# Patient Record
Sex: Male | Born: 1955 | Race: White | Hispanic: No | Smoking: Never smoker
Health system: Southern US, Community
[De-identification: ages and names within clinical notes are randomized; demographics above are authoritative.]

## PROBLEM LIST (undated history)

## (undated) DIAGNOSIS — N529 Male erectile dysfunction, unspecified: Secondary | ICD-10-CM

## (undated) DIAGNOSIS — F419 Anxiety disorder, unspecified: Secondary | ICD-10-CM

## (undated) DIAGNOSIS — G4700 Insomnia, unspecified: Secondary | ICD-10-CM

## (undated) DIAGNOSIS — T7840XA Allergy, unspecified, initial encounter: Secondary | ICD-10-CM

## (undated) DIAGNOSIS — I619 Nontraumatic intracerebral hemorrhage, unspecified: Secondary | ICD-10-CM

## (undated) DIAGNOSIS — F1910 Other psychoactive substance abuse, uncomplicated: Secondary | ICD-10-CM

## (undated) DIAGNOSIS — R4689 Other symptoms and signs involving appearance and behavior: Secondary | ICD-10-CM

## (undated) DIAGNOSIS — I10 Essential (primary) hypertension: Secondary | ICD-10-CM

## (undated) DIAGNOSIS — M7022 Olecranon bursitis, left elbow: Secondary | ICD-10-CM

## (undated) DIAGNOSIS — H269 Unspecified cataract: Secondary | ICD-10-CM

## (undated) DIAGNOSIS — L309 Dermatitis, unspecified: Secondary | ICD-10-CM

## (undated) DIAGNOSIS — F32A Depression, unspecified: Secondary | ICD-10-CM

## (undated) DIAGNOSIS — E785 Hyperlipidemia, unspecified: Secondary | ICD-10-CM

## (undated) DIAGNOSIS — I639 Cerebral infarction, unspecified: Secondary | ICD-10-CM

## (undated) DIAGNOSIS — R0789 Other chest pain: Secondary | ICD-10-CM

## (undated) DIAGNOSIS — F329 Major depressive disorder, single episode, unspecified: Secondary | ICD-10-CM

## (undated) DIAGNOSIS — Z87828 Personal history of other (healed) physical injury and trauma: Secondary | ICD-10-CM

## (undated) DIAGNOSIS — G459 Transient cerebral ischemic attack, unspecified: Secondary | ICD-10-CM

## (undated) DIAGNOSIS — R7303 Prediabetes: Secondary | ICD-10-CM

## (undated) HISTORY — DX: Essential (primary) hypertension: I10

## (undated) HISTORY — DX: Male erectile dysfunction, unspecified: N52.9

## (undated) HISTORY — DX: Insomnia, unspecified: G47.00

## (undated) HISTORY — PX: INCISE AND DRAIN ABCESS: PRO64

## (undated) HISTORY — DX: Allergy, unspecified, initial encounter: T78.40XA

## (undated) HISTORY — DX: Unspecified cataract: H26.9

## (undated) HISTORY — DX: Personal history of other (healed) physical injury and trauma: Z87.828

## (undated) HISTORY — DX: Dermatitis, unspecified: L30.9

## (undated) HISTORY — DX: Other chest pain: R07.89

## (undated) HISTORY — PX: SKIN TAG REMOVAL: SHX780

## (undated) HISTORY — DX: Other psychoactive substance abuse, uncomplicated: F19.10

## (undated) HISTORY — DX: Depression, unspecified: F32.A

## (undated) HISTORY — DX: Olecranon bursitis, left elbow: M70.22

## (undated) HISTORY — DX: Anxiety disorder, unspecified: F41.9

## (undated) HISTORY — PX: INNER EAR SURGERY: SHX679

## (undated) HISTORY — DX: Prediabetes: R73.03

## (undated) HISTORY — PX: KNEE ARTHROSCOPY: SUR90

## (undated) HISTORY — DX: Major depressive disorder, single episode, unspecified: F32.9

## (undated) HISTORY — DX: Hyperlipidemia, unspecified: E78.5

---

## 2000-04-09 ENCOUNTER — Emergency Department (HOSPITAL_COMMUNITY): Admission: EM | Admit: 2000-04-09 | Discharge: 2000-04-09 | Payer: Self-pay | Admitting: Emergency Medicine

## 2000-08-26 ENCOUNTER — Emergency Department (HOSPITAL_COMMUNITY): Admission: EM | Admit: 2000-08-26 | Discharge: 2000-08-26 | Payer: Self-pay | Admitting: Emergency Medicine

## 2000-08-26 ENCOUNTER — Encounter: Payer: Self-pay | Admitting: Emergency Medicine

## 2000-10-17 ENCOUNTER — Encounter: Admission: RE | Admit: 2000-10-17 | Discharge: 2000-10-17 | Payer: Self-pay | Admitting: *Deleted

## 2000-10-17 ENCOUNTER — Encounter: Payer: Self-pay | Admitting: *Deleted

## 2000-10-22 ENCOUNTER — Other Ambulatory Visit (HOSPITAL_COMMUNITY): Admission: RE | Admit: 2000-10-22 | Discharge: 2000-11-01 | Payer: Self-pay | Admitting: Psychiatry

## 2003-03-05 ENCOUNTER — Encounter: Payer: Self-pay | Admitting: Emergency Medicine

## 2003-03-05 ENCOUNTER — Emergency Department (HOSPITAL_COMMUNITY): Admission: EM | Admit: 2003-03-05 | Discharge: 2003-03-05 | Payer: Self-pay | Admitting: Emergency Medicine

## 2003-03-09 ENCOUNTER — Encounter: Admission: RE | Admit: 2003-03-09 | Discharge: 2003-03-09 | Payer: Self-pay | Admitting: Internal Medicine

## 2003-03-23 ENCOUNTER — Encounter: Admission: RE | Admit: 2003-03-23 | Discharge: 2003-03-23 | Payer: Self-pay | Admitting: Internal Medicine

## 2003-04-16 ENCOUNTER — Encounter: Admission: RE | Admit: 2003-04-16 | Discharge: 2003-04-16 | Payer: Self-pay | Admitting: Internal Medicine

## 2003-05-20 ENCOUNTER — Encounter: Admission: RE | Admit: 2003-05-20 | Discharge: 2003-05-20 | Payer: Self-pay | Admitting: Internal Medicine

## 2003-08-24 ENCOUNTER — Encounter: Admission: RE | Admit: 2003-08-24 | Discharge: 2003-08-24 | Payer: Self-pay | Admitting: Internal Medicine

## 2003-10-21 ENCOUNTER — Encounter: Admission: RE | Admit: 2003-10-21 | Discharge: 2003-10-21 | Payer: Self-pay | Admitting: Internal Medicine

## 2003-11-10 ENCOUNTER — Encounter: Admission: RE | Admit: 2003-11-10 | Discharge: 2003-11-10 | Payer: Self-pay | Admitting: Internal Medicine

## 2003-12-29 ENCOUNTER — Encounter: Admission: RE | Admit: 2003-12-29 | Discharge: 2003-12-29 | Payer: Self-pay | Admitting: Internal Medicine

## 2003-12-29 ENCOUNTER — Inpatient Hospital Stay (HOSPITAL_COMMUNITY): Admission: AD | Admit: 2003-12-29 | Discharge: 2003-12-31 | Payer: Self-pay | Admitting: Internal Medicine

## 2004-02-05 ENCOUNTER — Ambulatory Visit: Payer: Self-pay | Admitting: Internal Medicine

## 2004-03-31 ENCOUNTER — Ambulatory Visit: Payer: Self-pay | Admitting: Internal Medicine

## 2004-04-07 ENCOUNTER — Ambulatory Visit: Payer: Self-pay | Admitting: Internal Medicine

## 2004-07-21 ENCOUNTER — Emergency Department (HOSPITAL_COMMUNITY): Admission: EM | Admit: 2004-07-21 | Discharge: 2004-07-21 | Payer: Self-pay | Admitting: Emergency Medicine

## 2004-08-23 ENCOUNTER — Ambulatory Visit: Payer: Self-pay | Admitting: Internal Medicine

## 2005-01-23 ENCOUNTER — Ambulatory Visit: Payer: Self-pay | Admitting: Internal Medicine

## 2005-02-23 ENCOUNTER — Ambulatory Visit: Payer: Self-pay | Admitting: Internal Medicine

## 2005-05-05 ENCOUNTER — Ambulatory Visit: Payer: Self-pay | Admitting: Internal Medicine

## 2006-02-01 ENCOUNTER — Ambulatory Visit: Payer: Self-pay | Admitting: Hospitalist

## 2006-02-01 ENCOUNTER — Ambulatory Visit (HOSPITAL_COMMUNITY): Admission: RE | Admit: 2006-02-01 | Discharge: 2006-02-01 | Payer: Self-pay | Admitting: Hospitalist

## 2006-02-08 ENCOUNTER — Ambulatory Visit: Payer: Self-pay | Admitting: Internal Medicine

## 2006-03-09 ENCOUNTER — Ambulatory Visit: Payer: Self-pay | Admitting: Internal Medicine

## 2006-03-09 ENCOUNTER — Encounter (INDEPENDENT_AMBULATORY_CARE_PROVIDER_SITE_OTHER): Payer: Self-pay | Admitting: Internal Medicine

## 2006-03-09 LAB — CONVERTED CEMR LAB
Albumin: 4.3 g/dL (ref 3.5–5.2)
BUN: 13 mg/dL (ref 6–23)
CO2: 26 meq/L (ref 19–32)
Chloride: 101 meq/L (ref 96–112)
Sodium: 140 meq/L (ref 135–145)

## 2006-03-30 ENCOUNTER — Ambulatory Visit (HOSPITAL_COMMUNITY): Admission: RE | Admit: 2006-03-30 | Discharge: 2006-03-30 | Payer: Self-pay | Admitting: Internal Medicine

## 2006-04-04 ENCOUNTER — Encounter (INDEPENDENT_AMBULATORY_CARE_PROVIDER_SITE_OTHER): Payer: Self-pay | Admitting: Internal Medicine

## 2006-04-04 ENCOUNTER — Ambulatory Visit: Payer: Self-pay

## 2006-04-23 DIAGNOSIS — I10 Essential (primary) hypertension: Secondary | ICD-10-CM | POA: Insufficient documentation

## 2006-04-23 DIAGNOSIS — K219 Gastro-esophageal reflux disease without esophagitis: Secondary | ICD-10-CM | POA: Insufficient documentation

## 2006-04-23 DIAGNOSIS — E785 Hyperlipidemia, unspecified: Secondary | ICD-10-CM

## 2006-04-23 DIAGNOSIS — F5104 Psychophysiologic insomnia: Secondary | ICD-10-CM | POA: Insufficient documentation

## 2006-04-23 DIAGNOSIS — R079 Chest pain, unspecified: Secondary | ICD-10-CM

## 2006-04-23 DIAGNOSIS — S058X9A Other injuries of unspecified eye and orbit, initial encounter: Secondary | ICD-10-CM

## 2006-06-06 ENCOUNTER — Ambulatory Visit: Payer: Self-pay | Admitting: Internal Medicine

## 2006-06-07 ENCOUNTER — Encounter (INDEPENDENT_AMBULATORY_CARE_PROVIDER_SITE_OTHER): Payer: Self-pay | Admitting: Internal Medicine

## 2006-06-07 DIAGNOSIS — M23302 Other meniscus derangements, unspecified lateral meniscus, unspecified knee: Secondary | ICD-10-CM | POA: Insufficient documentation

## 2006-06-07 DIAGNOSIS — F329 Major depressive disorder, single episode, unspecified: Secondary | ICD-10-CM

## 2006-06-07 DIAGNOSIS — L909 Atrophic disorder of skin, unspecified: Secondary | ICD-10-CM | POA: Insufficient documentation

## 2006-06-07 DIAGNOSIS — L919 Hypertrophic disorder of the skin, unspecified: Secondary | ICD-10-CM

## 2006-06-07 DIAGNOSIS — F528 Other sexual dysfunction not due to a substance or known physiological condition: Secondary | ICD-10-CM

## 2006-06-07 LAB — CONVERTED CEMR LAB
Amphetamine Screen, Ur: NEGATIVE
BUN: 14 mg/dL (ref 6–23)
Barbiturate Quant, Ur: NEGATIVE
Benzodiazepines.: NEGATIVE
Calcium: 9.1 mg/dL (ref 8.4–10.5)
Chloride: 103 meq/L (ref 96–112)
Cocaine Metabolites: NEGATIVE
Creatinine, Ser: 0.95 mg/dL (ref 0.40–1.50)
Creatinine,U: 114.2 mg/dL
Marijuana Metabolite: NEGATIVE
Opiates: NEGATIVE
Phencyclidine (PCP): NEGATIVE
Potassium: 4.3 meq/L (ref 3.5–5.3)

## 2006-06-18 ENCOUNTER — Telehealth: Payer: Self-pay | Admitting: *Deleted

## 2006-07-18 ENCOUNTER — Telehealth: Payer: Self-pay | Admitting: *Deleted

## 2006-08-23 ENCOUNTER — Telehealth: Payer: Self-pay | Admitting: *Deleted

## 2006-08-23 ENCOUNTER — Observation Stay (HOSPITAL_COMMUNITY): Admission: EM | Admit: 2006-08-23 | Discharge: 2006-08-24 | Payer: Self-pay | Admitting: Emergency Medicine

## 2006-08-23 ENCOUNTER — Ambulatory Visit: Payer: Self-pay | Admitting: Infectious Diseases

## 2006-08-24 ENCOUNTER — Ambulatory Visit: Payer: Self-pay | Admitting: Cardiology

## 2006-09-05 ENCOUNTER — Telehealth: Payer: Self-pay | Admitting: *Deleted

## 2006-09-06 ENCOUNTER — Encounter: Payer: Self-pay | Admitting: Internal Medicine

## 2006-09-06 ENCOUNTER — Ambulatory Visit: Payer: Self-pay | Admitting: Internal Medicine

## 2006-09-06 DIAGNOSIS — H9190 Unspecified hearing loss, unspecified ear: Secondary | ICD-10-CM

## 2006-09-06 DIAGNOSIS — B351 Tinea unguium: Secondary | ICD-10-CM

## 2006-09-06 LAB — CONVERTED CEMR LAB
BUN: 12 mg/dL (ref 6–23)
CO2: 28 meq/L (ref 19–32)
Glucose, Bld: 92 mg/dL (ref 70–99)

## 2006-10-17 ENCOUNTER — Ambulatory Visit: Payer: Self-pay | Admitting: Hospitalist

## 2006-10-17 DIAGNOSIS — L259 Unspecified contact dermatitis, unspecified cause: Secondary | ICD-10-CM

## 2006-11-08 ENCOUNTER — Telehealth: Payer: Self-pay | Admitting: *Deleted

## 2007-01-31 ENCOUNTER — Telehealth: Payer: Self-pay | Admitting: *Deleted

## 2007-03-01 ENCOUNTER — Ambulatory Visit: Payer: Self-pay | Admitting: *Deleted

## 2007-03-01 ENCOUNTER — Encounter (INDEPENDENT_AMBULATORY_CARE_PROVIDER_SITE_OTHER): Payer: Self-pay | Admitting: Internal Medicine

## 2007-03-01 LAB — CONVERTED CEMR LAB
AST: 15 units/L (ref 0–37)
Alkaline Phosphatase: 73 units/L (ref 39–117)
CO2: 26 meq/L (ref 19–32)
Calcium: 9.4 mg/dL (ref 8.4–10.5)
Creatinine, Ser: 1.09 mg/dL (ref 0.40–1.50)
Glucose, Bld: 98 mg/dL (ref 70–99)
HCT: 45.4 % (ref 39.0–52.0)
Hemoglobin: 15.2 g/dL (ref 13.0–17.0)
Lymphs Abs: 1.7 10*3/uL (ref 0.7–3.3)
MCV: 86.1 fL (ref 78.0–100.0)
Monocytes Absolute: 0.7 10*3/uL (ref 0.2–0.7)
Neutro Abs: 6 10*3/uL (ref 1.7–7.7)
Potassium: 3.8 meq/L (ref 3.5–5.3)
RBC: 5.27 M/uL (ref 4.22–5.81)
RDW: 13.7 % (ref 11.5–14.0)
TSH: 0.589 microintl units/mL (ref 0.350–5.50)
Total Bilirubin: 0.5 mg/dL (ref 0.3–1.2)
WBC: 9.1 10*3/uL (ref 4.0–10.5)

## 2007-04-07 ENCOUNTER — Emergency Department (HOSPITAL_COMMUNITY): Admission: EM | Admit: 2007-04-07 | Discharge: 2007-04-07 | Payer: Self-pay | Admitting: Emergency Medicine

## 2007-04-29 ENCOUNTER — Telehealth: Payer: Self-pay | Admitting: *Deleted

## 2007-10-28 ENCOUNTER — Ambulatory Visit: Payer: Self-pay | Admitting: *Deleted

## 2007-11-08 ENCOUNTER — Ambulatory Visit: Payer: Self-pay | Admitting: Internal Medicine

## 2007-11-08 DIAGNOSIS — S83509A Sprain of unspecified cruciate ligament of unspecified knee, initial encounter: Secondary | ICD-10-CM

## 2007-12-20 ENCOUNTER — Encounter (INDEPENDENT_AMBULATORY_CARE_PROVIDER_SITE_OTHER): Payer: Self-pay | Admitting: Internal Medicine

## 2007-12-20 ENCOUNTER — Ambulatory Visit: Payer: Self-pay | Admitting: Internal Medicine

## 2007-12-20 LAB — CONVERTED CEMR LAB: PSA: 1.36 ng/mL (ref 0.10–4.00)

## 2007-12-21 LAB — FECAL OCCULT BLOOD, GUAIAC: Fecal Occult Blood: NEGATIVE

## 2007-12-26 ENCOUNTER — Ambulatory Visit: Payer: Self-pay | Admitting: Internal Medicine

## 2007-12-26 LAB — CONVERTED CEMR LAB
OCCULT 2: NEGATIVE
OCCULT 3: NEGATIVE

## 2008-01-13 ENCOUNTER — Encounter (INDEPENDENT_AMBULATORY_CARE_PROVIDER_SITE_OTHER): Payer: Self-pay | Admitting: Internal Medicine

## 2008-01-23 ENCOUNTER — Ambulatory Visit: Payer: Self-pay | Admitting: Internal Medicine

## 2008-02-04 ENCOUNTER — Encounter (INDEPENDENT_AMBULATORY_CARE_PROVIDER_SITE_OTHER): Payer: Self-pay | Admitting: Internal Medicine

## 2008-02-06 ENCOUNTER — Emergency Department (HOSPITAL_COMMUNITY): Admission: EM | Admit: 2008-02-06 | Discharge: 2008-02-06 | Payer: Self-pay | Admitting: Emergency Medicine

## 2008-02-06 ENCOUNTER — Ambulatory Visit: Payer: Self-pay | Admitting: Internal Medicine

## 2008-02-06 LAB — CONVERTED CEMR LAB
Glucose, Urine, Semiquant: 100
Specific Gravity, Urine: 1.025

## 2008-06-25 ENCOUNTER — Encounter (INDEPENDENT_AMBULATORY_CARE_PROVIDER_SITE_OTHER): Payer: Self-pay | Admitting: Internal Medicine

## 2008-06-25 ENCOUNTER — Ambulatory Visit: Payer: Self-pay | Admitting: Internal Medicine

## 2008-06-25 ENCOUNTER — Ambulatory Visit (HOSPITAL_COMMUNITY): Admission: RE | Admit: 2008-06-25 | Discharge: 2008-06-25 | Payer: Self-pay | Admitting: Internal Medicine

## 2008-07-10 ENCOUNTER — Emergency Department (HOSPITAL_COMMUNITY): Admission: EM | Admit: 2008-07-10 | Discharge: 2008-07-10 | Payer: Self-pay | Admitting: Emergency Medicine

## 2008-09-11 ENCOUNTER — Telehealth (INDEPENDENT_AMBULATORY_CARE_PROVIDER_SITE_OTHER): Payer: Self-pay | Admitting: Internal Medicine

## 2008-09-18 ENCOUNTER — Telehealth (INDEPENDENT_AMBULATORY_CARE_PROVIDER_SITE_OTHER): Payer: Self-pay | Admitting: Internal Medicine

## 2009-01-11 ENCOUNTER — Ambulatory Visit (HOSPITAL_COMMUNITY): Admission: RE | Admit: 2009-01-11 | Discharge: 2009-01-11 | Payer: Self-pay | Admitting: Internal Medicine

## 2009-01-11 ENCOUNTER — Encounter: Payer: Self-pay | Admitting: Internal Medicine

## 2009-01-11 ENCOUNTER — Ambulatory Visit: Payer: Self-pay | Admitting: Internal Medicine

## 2009-01-11 DIAGNOSIS — L84 Corns and callosities: Secondary | ICD-10-CM

## 2009-01-18 ENCOUNTER — Encounter: Payer: Self-pay | Admitting: Internal Medicine

## 2009-01-21 ENCOUNTER — Ambulatory Visit: Payer: Self-pay | Admitting: Internal Medicine

## 2009-01-29 ENCOUNTER — Telehealth: Payer: Self-pay | Admitting: Internal Medicine

## 2009-02-09 ENCOUNTER — Telehealth: Payer: Self-pay | Admitting: *Deleted

## 2009-02-10 ENCOUNTER — Telehealth: Payer: Self-pay | Admitting: Internal Medicine

## 2009-02-10 ENCOUNTER — Telehealth: Payer: Self-pay | Admitting: *Deleted

## 2009-02-15 ENCOUNTER — Ambulatory Visit: Payer: Self-pay | Admitting: Family Medicine

## 2009-02-15 ENCOUNTER — Telehealth: Payer: Self-pay | Admitting: Internal Medicine

## 2009-03-29 ENCOUNTER — Telehealth: Payer: Self-pay | Admitting: *Deleted

## 2009-04-01 ENCOUNTER — Ambulatory Visit: Payer: Self-pay | Admitting: Internal Medicine

## 2009-04-01 DIAGNOSIS — L408 Other psoriasis: Secondary | ICD-10-CM | POA: Insufficient documentation

## 2009-04-01 DIAGNOSIS — L570 Actinic keratosis: Secondary | ICD-10-CM | POA: Insufficient documentation

## 2009-04-01 LAB — CONVERTED CEMR LAB

## 2009-04-20 ENCOUNTER — Telehealth: Payer: Self-pay | Admitting: Internal Medicine

## 2009-07-06 ENCOUNTER — Telehealth: Payer: Self-pay | Admitting: Internal Medicine

## 2009-08-20 ENCOUNTER — Ambulatory Visit: Payer: Self-pay | Admitting: Internal Medicine

## 2009-10-27 DIAGNOSIS — M7022 Olecranon bursitis, left elbow: Secondary | ICD-10-CM

## 2009-10-27 HISTORY — DX: Olecranon bursitis, left elbow: M70.22

## 2009-11-06 ENCOUNTER — Emergency Department (HOSPITAL_COMMUNITY): Admission: EM | Admit: 2009-11-06 | Discharge: 2009-11-06 | Payer: Self-pay | Admitting: Emergency Medicine

## 2009-11-12 ENCOUNTER — Telehealth: Payer: Self-pay | Admitting: Internal Medicine

## 2009-11-12 ENCOUNTER — Emergency Department (HOSPITAL_COMMUNITY): Admission: EM | Admit: 2009-11-12 | Discharge: 2009-11-12 | Payer: Self-pay | Admitting: Emergency Medicine

## 2009-11-16 ENCOUNTER — Ambulatory Visit: Payer: Self-pay | Admitting: Internal Medicine

## 2009-11-16 DIAGNOSIS — M25529 Pain in unspecified elbow: Secondary | ICD-10-CM

## 2009-11-19 ENCOUNTER — Ambulatory Visit: Payer: Self-pay | Admitting: Internal Medicine

## 2009-11-19 ENCOUNTER — Ambulatory Visit: Payer: Self-pay | Admitting: Family Medicine

## 2009-11-19 ENCOUNTER — Inpatient Hospital Stay (HOSPITAL_COMMUNITY): Admission: AD | Admit: 2009-11-19 | Discharge: 2009-11-23 | Payer: Self-pay | Admitting: Family Medicine

## 2009-11-19 ENCOUNTER — Encounter: Payer: Self-pay | Admitting: Internal Medicine

## 2009-11-19 DIAGNOSIS — R05 Cough: Secondary | ICD-10-CM

## 2009-11-23 ENCOUNTER — Encounter: Payer: Self-pay | Admitting: Internal Medicine

## 2009-11-23 DIAGNOSIS — IMO0002 Reserved for concepts with insufficient information to code with codable children: Secondary | ICD-10-CM

## 2009-11-25 ENCOUNTER — Telehealth (INDEPENDENT_AMBULATORY_CARE_PROVIDER_SITE_OTHER): Payer: Self-pay | Admitting: *Deleted

## 2009-12-01 ENCOUNTER — Ambulatory Visit: Payer: Self-pay | Admitting: Internal Medicine

## 2009-12-10 ENCOUNTER — Ambulatory Visit: Payer: Self-pay | Admitting: Family Medicine

## 2009-12-31 ENCOUNTER — Ambulatory Visit: Payer: Self-pay | Admitting: Family Medicine

## 2010-01-04 ENCOUNTER — Ambulatory Visit (HOSPITAL_COMMUNITY): Admission: RE | Admit: 2010-01-04 | Discharge: 2010-01-04 | Payer: Self-pay | Admitting: Family Medicine

## 2010-01-12 ENCOUNTER — Telehealth (INDEPENDENT_AMBULATORY_CARE_PROVIDER_SITE_OTHER): Payer: Self-pay | Admitting: *Deleted

## 2010-01-27 ENCOUNTER — Telehealth: Payer: Self-pay | Admitting: Internal Medicine

## 2010-02-14 ENCOUNTER — Ambulatory Visit: Payer: Self-pay | Admitting: Family Medicine

## 2010-03-10 ENCOUNTER — Telehealth: Payer: Self-pay | Admitting: Internal Medicine

## 2010-03-14 ENCOUNTER — Encounter: Admission: RE | Admit: 2010-03-14 | Discharge: 2010-04-07 | Payer: Self-pay | Admitting: Family Medicine

## 2010-03-14 ENCOUNTER — Encounter (INDEPENDENT_AMBULATORY_CARE_PROVIDER_SITE_OTHER): Payer: Self-pay | Admitting: *Deleted

## 2010-03-18 ENCOUNTER — Telehealth: Payer: Self-pay | Admitting: Family Medicine

## 2010-04-05 ENCOUNTER — Telehealth: Payer: Self-pay | Admitting: Internal Medicine

## 2010-04-07 ENCOUNTER — Encounter: Payer: Self-pay | Admitting: Family Medicine

## 2010-06-18 ENCOUNTER — Encounter: Payer: Self-pay | Admitting: Internal Medicine

## 2010-06-20 ENCOUNTER — Telehealth: Payer: Self-pay | Admitting: Internal Medicine

## 2010-06-23 ENCOUNTER — Ambulatory Visit: Admission: RE | Admit: 2010-06-23 | Discharge: 2010-06-23 | Payer: Self-pay | Source: Home / Self Care

## 2010-06-23 DIAGNOSIS — H9319 Tinnitus, unspecified ear: Secondary | ICD-10-CM | POA: Insufficient documentation

## 2010-06-26 LAB — CONVERTED CEMR LAB
ALT: 22 units/L (ref 0–53)
AST: 17 units/L (ref 0–37)
Alkaline Phosphatase: 72 units/L (ref 39–117)
BUN: 15 mg/dL (ref 6–23)
Calcium: 9.6 mg/dL (ref 8.4–10.5)
Chloride: 102 meq/L (ref 96–112)
HDL: 36 mg/dL — ABNORMAL LOW (ref >39)
Total Bilirubin: 0.6 mg/dL (ref 0.3–1.2)
Total CHOL/HDL Ratio: 6.8
Total Protein: 7.5 g/dL (ref 6.0–8.3)
VLDL: 41 mg/dL — ABNORMAL HIGH (ref 0–40)

## 2010-06-28 NOTE — Progress Notes (Signed)
Summary: refill/ hla  Phone Note Refill Request Message from:  Fax from Pharmacy on January 27, 2010 4:04 PM  Refills Requested: Medication #1:  vicodin 5/500 1 tablet twice daily   Dosage confirmed as above?Dosage Confirmed   Last Refilled: 3/30 last visit 6/21, has had percocet 7/15 by sara neal, was in hosp 6/24  Initial call taken by: Marin Roberts RN,  January 27, 2010 4:08 PM  Follow-up for Phone Call        Need was documented in D/C summary 6/24 for chronic r knee pain. ot Perc 5 mg #30 7/18 Dr Jennette Kettle. I had trouble finding last hydrocone Rx but looks like 1/10. Will refill using the last strength I could find - 5/500. Follow-up by: Blanch Media MD,  January 28, 2010 7:56 AM    New/Updated Medications: HYDROCODONE-ACETAMINOPHEN 5-500 MG TABS (HYDROCODONE-ACETAMINOPHEN) Take one pill by mouth every 4 hours as needed for severe R knee pain. Prescriptions: HYDROCODONE-ACETAMINOPHEN 5-500 MG TABS (HYDROCODONE-ACETAMINOPHEN) Take one pill by mouth every 4 hours as needed for severe R knee pain.  #30 x 0   Entered and Authorized by:   Blanch Media MD   Signed by:   Blanch Media MD on 01/28/2010   Method used:   Telephoned to ...       [facility] (retail)       [address]       [place], [place]  [address]       Ph: [phone]       Fax: [phone]   RxID:   [id number]

## 2010-06-28 NOTE — Assessment & Plan Note (Signed)
Summary: 9:00 APPT,F/U L ELBOW INFECTION   Vital Signs:  Patient profile:   55 year old male BP sitting:   138 / 85  Vitals Entered By: Lillia Pauls CMA (December 31, 2009 9:08 AM)  Primary Care Tayler Heiden:  Mariea Stable MD   History of Present Illness: 55 yo M here to f/u L olecranon bursitis and elbow infection.  Last month had I&D and completed 2 week course of Abx. Has not gotten his full ROM in elbow back. Still having some deep seeded pain in elbow joint.  Using opposite arm to help extend. Still having some night time pain that wakes him up. Unable to put direct pressure on his elbow and still feels swollen. Able to use elbow ok, but still with decreased strength. No F/S/C. Felt slightly swollen and warm a few days ago, tylenol helped. Has only used a few percocet. Has not done any PT.  Current Medications (verified): 1)  Aspirin 81 Mg Chew (Aspirin) .... Take 1 Tablet By Mouth Once A Day 2)  Lisinopril-Hydrochlorothiazide 20-12.5 Mg  Tabs (Lisinopril-Hydrochlorothiazide) .... Take 2 Tablets By Mouth Once A Day 3)  Carvedilol 12.5 Mg Tabs (Carvedilol) .... Take 1 Tablet By Mouth Two Times A Day 4)  Pravachol 40 Mg Tabs (Pravastatin Sodium) .... Take 1 Tab By Mouth At Bedtime 5)  Betamethasone Dipropionate 0.05 % Oint (Betamethasone Dipropionate) .... Apply To Affected Area Twice Daily. 6)  Percocet 5-325 Mg Tabs (Oxycodone-Acetaminophen) .Marland Kitchen.. 1-2 By Mouth At Bedtime As Needed Pain 7)  Valium 5 Mg Tabs (Diazepam) .... Take One Tab One Hour Before The Procedure and Addition Tab Prn  Allergies: No Known Drug Allergies  Review of Systems  The patient denies fever.         Please see HPI for additional ROS.   Physical Exam  General:  alert, well-nourished, and overweight-appearing.     Shoulder/Elbow Exam  Skin:    No redness/warmth  Inspection:    Mild swelling about olecranon bursa  Elbow Exam:    Left:    Inspection:  Normal    Nl ROM    .  Pt with  ROM of L elbow 0-150 deg. + pain with resisted elbow extension particularly in ulnar nerve canal. tricep nontender   Impression & Recommendations:  Problem # 1:  ELBOW PAIN, LEFT (ICD-719.42) Assessment Improved  continued issues- I am concerned there is residual infection or inflammation---will repeat MRI. If it is negative, we weill start PT. rtc 4 w will call him w MRI results (and to set up PT if appropriate)  Orders: MRI without Contrast (MRI w/o Contrast)  Problem # 2:  CELLULITIS AND ABSCESS OF UPPER ARM AND FOREARM (ICD-682.3) Assessment: Improved  Orders: MRI without Contrast (MRI w/o Contrast)  Complete Medication List: 1)  Aspirin 81 Mg Chew (Aspirin) .... Take 1 tablet by mouth once a day 2)  Lisinopril-hydrochlorothiazide 20-12.5 Mg Tabs (Lisinopril-hydrochlorothiazide) .... Take 2 tablets by mouth once a day 3)  Carvedilol 12.5 Mg Tabs (Carvedilol) .... Take 1 tablet by mouth two times a day 4)  Pravachol 40 Mg Tabs (Pravastatin sodium) .... Take 1 tab by mouth at bedtime 5)  Betamethasone Dipropionate 0.05 % Oint (Betamethasone dipropionate) .... Apply to affected area twice daily. 6)  Percocet 5-325 Mg Tabs (Oxycodone-acetaminophen) .Marland Kitchen.. 1-2 by mouth at bedtime as needed pain 7)  Valium 5 Mg Tabs (Diazepam) .... Take one tab one hour before the procedure and addition tab prn  Patient Instructions: 1)  MRI IS ON TUE AUG 9TH AT 2PM AT [facility]. ARRIVE AT 1PM IN ADMITTING Prescriptions: VALIUM 5 MG TABS (DIAZEPAM) take one tab one hour before the procedure and addition tab prn  #2 x 0   Entered by:   Lillia Pauls CMA   Authorized by:   Denny Levy MD   Signed by:   Lillia Pauls CMA on 12/31/2009   Method used:   Print then Give to Patient   RxID:   [id number]

## 2010-06-28 NOTE — Miscellaneous (Signed)
Summary: need order for elbow PT  Clinical Lists Changes pt wants to go ahead with elbow pt. Becky, the pt at [employer] [address] wants the order faxed to [phone]. call her for questions at [phone].Marland KitchenGolden Circle RN  March 14, 2010 5:39 PM [place] didn't we do this yesterday? If not--can we do it today? [place] Dec yes.... pt to have iontophoreisis as new PT order. Lillia Pauls [facility]  March 15, 2010 5:32 PM

## 2010-06-28 NOTE — Letter (Signed)
Summary: [facility] PT Referral form  [facility] PT Referral form   Imported By: Marily Memos 02/15/2010 08:43:47  _____________________________________________________________________  External Attachment:    Type:   Image     Comment:   External Document

## 2010-06-28 NOTE — Miscellaneous (Signed)
Summary: hospital admission (left elbow pain)  INTERNAL MEDICINE ADMISSION HISTORY AND PHYSICAL  PCP: Dr. Onalee Hua  CC: swollen elbow joint  HPI: 55 y/o man with PMH [facility] a [facility] of septic joint, He first noticed swelling, pain and redness in his left elbow 1 week ago. He went to the [facility] ED  where he was asked to take NSAIDs for possible bursisits. . His swelling and pain did not get better. He came to [facility] 6/17 where he was given  keflex and doxy His symptoms got better after this but did not resove completely. He came to clinc on Tuesday 6/21 when he was referred to [facility] . He went to the [facility] on the day of admission where a suspicion of septic joint was rasied and patient admitted to hospital. His associated symtoms include subjective fever and fatigue  since the onset of symtoms. He also has a cough productive of whitish sputum since last 3-4 days and he has been having occasional shortness of breath. He has not had the complaints of joint swelling or pain in the past.he denies sick contacts, travel, injury to the affected joint, other joint involvement, rash,repetetive movemnt of the joint or other complaints. No h/o Gout, morning stiffenss  ALLERGIES: NKDA  PAST MEDICAL HISTORY: GERD Hyperlipidemia Hypertension Depression ACL tear and meniscal tear causing chroinic right knee pain.  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 Separated, but ex-wife is his "best friend" 2 daughters, 1 grandson Special educational needs teacher in past, unemplyed now.  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  Never Smoked Alcohol use-occasional on social occasion Drugs- denies Self pay- in process for apllying for orange card   FAMILY HISTORY Father- died of CAD at 26. Has emhpysema as well Mother- Had stroke at age of 85 Sister has severe DM. Iss 48 y/o   VITALS:  O2 Sat:      97 % Pulse rate:   78 / minute Resp:     26 per minute BP sitting:   130 / 84 T- 98.8  PHYSICAL EXAM: General:  Well-developed,well-nourished,in no acute distress;  Appears uncomfortable 2/2 left elbow pain. Neck:  No LAD. Lungs:  Good aeration overall.Some non-productive coughing on deep inspiration.Faint RLL crackles during coughing episode. Heart:  RRR. Normal S1/S2. No m/r/g. 2+ bilateral rad/dp pulses. No edema. Abdomen:  (+) BS. Soft. NT/ND. Msk:  Focal erythematous area of ttp, slight fluctuance, and increased warmth overlying left olecranon. Surround area of slight erythema, ttp, and increased warmth. Skin is intact without apparent wounds. No drainage. Limited elbow extension and flexion 2/2 pain and sensation of pressure inside the left elbow joint. Pulses:  2+ bilateral radial pulses. Neurologic:  Sensation intact throughout the upper extremities. Full ROM and strength throughout the left wrist and fingers though patient reluctant to grip with full strength 2/2 reproduced left elbow pain.  LABS:   Sodium (NA)  138               135-145          mEq/L  Potassium (K)                            3.4        l      3.5-5.1          mEq/L  Chloride                                 104               96-112           mEq/L  CO2                                      24                19-32            mEq/L  Glucose                                  103        h      70-99            mg/dL  BUN                                       16                6-23             mg/dL  Creatinine                               0.88              0.4-1.5          mg/dL  GFR, Est Non African American            >60               >60              mL/min  GFR, Est African American                >60               >60              mL/min    Oversized comment, see footnote  1  Calcium                                  8.7               8.4-10.5         mg/dL  WBC  10.1              4.0-10.5         K/uL  RBC                                      4.39              4.22-5.81        MIL/uL  Hemoglobin (HGB)                         13.0              13.0-17.0        g/dL  Hematocrit (HCT)                         37.8       l      39.0-52.0        %  MCV                                      86.2              78.0-100.0       fL  MCH -                                    29.6              26.0-34.0        pg  MCHC                                     34.4              30.0-36.0        g/dL  RDW                                      13.2              11.5-15.5        %  Platelet Count (PLT)                     400               150-400          K/uL  Neutrophils, %                           66                43-77            %  Lymphocytes, %                           17                12-46            %  Monocytes, %  11                3-12             %  Eosinophils, %                           6          h      0-5              %  Basophils, %                             1                 0-1              %  Neutrophils, Absolute                    6.6               1.7-7.7          K/uL  Lymphocytes, Absolute                    1.7               0.7-4.0          K/uL  Monocytes, Absolute                      1.1        h      0.1-1.0          K/uL  Eosinophils, Absolute                    0.6               0.0-0.7          K/uL  Basophils, Absolute                       0.1               0.0-0.1          K/uL  XRAy LEft Elbow 11/06- No acute findings.  Early degenerative spurring. MRI   ASSESSMENT AND PLAN:  1. Monoarticular joint swelling dd- septic arthritis, infected bursitis, gout, gonococcal arthrist, RA very less likely - IR to tap the joint and assess for crystals, gm stain, cell count and cullture - Blood Cx -IV vanc and ceftaz after the procedure - MRI ordered by [facility] to r/o osteo  - Pain control with morphine  2. HTN- Will hold HCTZ for now for possible gout. Will restart HCTZ-lisinopril if no crystals on synovial fluids. conitnue coreg, lisinopril 3. HLD- Will check FLP and HbAic. continue zocor 4. VTE PROPH: lovenox

## 2010-06-28 NOTE — Miscellaneous (Signed)
Summary: [facility]  [facility]   Imported By: Marily Memos 04/28/2010 10:58:28  _____________________________________________________________________  External Attachment:    Type:   Image     Comment:   External Document

## 2010-06-28 NOTE — Assessment & Plan Note (Signed)
Summary: FU ELBOW/HOSPITAL ADMIT/MJD   Vital Signs:  Patient profile:   55 year old male BP sitting:   170 / 100  Vitals Entered By: Lillia Pauls CMA (December 10, 2009 9:27 AM)  Primary Care Provider:  Mariea Stable MD   History of Present Illness: f/u left elbow infection Pain is better but still very paoinful at night--6/10, keepoing him awake. OK to tolerate  during day. No fevers. Incision site is not drainig. No warmth of elbow.  Current Medications (verified): 1)  Aspirin 81 Mg Chew (Aspirin) .... Take 1 Tablet By Mouth Once A Day 2)  Lisinopril-Hydrochlorothiazide 20-12.5 Mg  Tabs (Lisinopril-Hydrochlorothiazide) .... Take 2 Tablets By Mouth Once A Day 3)  Carvedilol 12.5 Mg Tabs (Carvedilol) .... Take 1 Tablet By Mouth Two Times A Day 4)  Vicodin 5-500 Mg Tabs (Hydrocodone-Acetaminophen) .... Take 1 Tablet By Mouth Two Times A Day 5)  Pravachol 40 Mg Tabs (Pravastatin Sodium) .... Take 1 Tab By Mouth At Bedtime 6)  Betamethasone Dipropionate 0.05 % Oint (Betamethasone Dipropionate) .... Apply To Affected Area Twice Daily.  Allergies: No Known Drug Allergies  Review of Systems       Please see HPI for additional ROS.   Physical Exam  General:  alert, well-developed, well-nourished, and well-hydrated.   Msk:  Left elbow no erythema or warmth. Slow full extension, a little lack  of flexion secondary to pain (about 5-10 degrees) still TTP over olecranon but mild. Boggy subcutaneous tissue but no effusion or abscess.  Neurologic:  normal soft touch sensation in fingers hands and forearm   Impression & Recommendations:  Problem # 1:  CELLULITIS AND ABSCESS OF UPPER ARM AND FOREARM (ICD-682.3) s/p hosipitalization with I and D of olecranon bursa. By his report (not in DC summary) no crystals were seen on fluid so Ortho said NOT GOUT. MRI showed cellulitis with possible extension to surrounding bone--he has 2 days left of clindamycin oral. Wil give him 2 weeks of pain meds  for at bedtime use and f/u in clinic.  Complete Medication List: 1)  Aspirin 81 Mg Chew (Aspirin) .... Take 1 tablet by mouth once a day 2)  Lisinopril-hydrochlorothiazide 20-12.5 Mg Tabs (Lisinopril-hydrochlorothiazide) .... Take 2 tablets by mouth once a day 3)  Carvedilol 12.5 Mg Tabs (Carvedilol) .... Take 1 tablet by mouth two times a day 4)  Pravachol 40 Mg Tabs (Pravastatin sodium) .... Take 1 tab by mouth at bedtime 5)  Betamethasone Dipropionate 0.05 % Oint (Betamethasone dipropionate) .... Apply to affected area twice daily. 6)  Percocet 5-325 Mg Tabs (Oxycodone-acetaminophen) .Marland Kitchen.. 1-2 by mouth at bedtime as needed pain Prescriptions: PERCOCET 5-325 MG TABS (OXYCODONE-ACETAMINOPHEN) 1-2 by mouth at bedtime as needed pain  #30 x 0   Entered and Authorized by:   Denny Levy MD   Signed by:   Denny Levy MD on 12/13/2009   Method used:   Handwritten   RxID:   [id number]   Appended Document: FU ELBOW/HOSPITAL ADMIT/MJD    Clinical Lists Changes  Orders: Added new Service order of Est. Patient Level III (14782) - Signed       Complete Medication List: 1)  Aspirin 81 Mg Chew (Aspirin) .... Take 1 tablet by mouth once a day 2)  Lisinopril-hydrochlorothiazide 20-12.5 Mg Tabs (Lisinopril-hydrochlorothiazide) .... Take 2 tablets by mouth once a day 3)  Carvedilol 12.5 Mg Tabs (Carvedilol) .... Take 1 tablet by mouth two times a day 4)  Pravachol 40 Mg Tabs (Pravastatin sodium) .... Take 1  tab by mouth at bedtime 5)  Betamethasone Dipropionate 0.05 % Oint (Betamethasone dipropionate) .... Apply to affected area twice daily. 6)  Percocet 5-325 Mg Tabs (Oxycodone-acetaminophen) .Marland Kitchen.. 1-2 by mouth at bedtime as needed pain

## 2010-06-28 NOTE — Progress Notes (Signed)
Summary: refill/ hla  Phone Note Refill Request Message from:  Patient on July 06, 2009 2:35 PM  Refills Requested: Medication #1:  LISINOPRIL-HYDROCHLOROTHIAZIDE 20-12.5 MG  TABS Take 2 tablets by mouth once a day   Last Refilled: 12/10 Initial call taken by: Marin Roberts RN,  July 06, 2009 2:35 PM  Follow-up for Phone Call        Rx faxed to pharmacy Follow-up by: Mariea Stable MD,  July 07, 2009 6:56 AM    Prescriptions: LISINOPRIL-HYDROCHLOROTHIAZIDE 20-12.5 MG  TABS (LISINOPRIL-HYDROCHLOROTHIAZIDE) Take 2 tablets by mouth once a day  #60 Tablet x 11   Entered and Authorized by:   Mariea Stable MD   Signed by:   Mariea Stable MD on 07/07/2009   Method used:   Electronically to        [facility] (retail)       [address]       [place], [place]  [address]       Ph: [phone]       Fax: [phone]   RxID:   [id number]

## 2010-06-28 NOTE — Assessment & Plan Note (Signed)
Summary: ear drainage [mkj]   Vital Signs:  Patient profile:   55 year old male Height:      68.5 inches (173.99 cm) Weight:      237.0 pounds (107.73 kg) BMI:     35.64 Temp:     98.5 degrees F (36.94 degrees C) oral Pulse rate:   83 / minute BP sitting:   150 / 92  (right arm)  Vitals Entered By: Stanton Kidney Ditzler RN (August 20, 2009 3:33 PM) Is Patient Diabetic? No Pain Assessment Patient in pain? yes     Location: left ear and right ft Intensity: 8 Type: pain worse Onset of pain  past 6 months Nutritional Status BMI of > 30 = obese Nutritional Status Detail appetite good  Have you ever been in a relationship where you felt threatened, hurt or afraid?denies   Does patient need assistance? Functional Status Self care Ambulation Normal Comments Injuried left plastic eardrum with q-tip 08/18/09 - having bt red to clear drainage. Right ft is worse. Skin rash is worse - oint no help.   Primary Care Provider:  Mariea Stable MD   History of Present Illness: Pt is a 55 yo male w/ past med hx below here for:  1.  L ear drainage-bloody d/c w/ some purulent material from last 2 days.  Started after he used a q-tip to clean his ear canal after showering.  No frank ear pain but does note some "soreness."  He had a plastic ear drum 30+ years ago.  No fevers, chills, HA's.  2.  R foot callous-He saw a podiatrist about it and it was shaved but it still continues to hurt.  He was seen in sport's medicine for this, as well.  Walking with a limp.    3.  Rash on his knee: Feels like rash is spreading.  It is pruritic in nature.  Is using topical med given at last visit regularly and still bothering him.   Depression History:      The patient denies a depressed mood most of the day and a diminished interest in his usual daily activities.         Preventive Screening-Counseling & Management  Alcohol-Tobacco     Smoking Status: never  Caffeine-Diet-Exercise     Does Patient Exercise:  yes     Type of exercise: WALKING  Current Medications (verified): 1)  Aspirin 81 Mg Chew (Aspirin) .... Take 1 Tablet By Mouth Once A Day 2)  Lisinopril-Hydrochlorothiazide 20-12.5 Mg  Tabs (Lisinopril-Hydrochlorothiazide) .... Take 2 Tablets By Mouth Once A Day 3)  Carvedilol 6.25 Mg Tabs (Carvedilol) .... Take 1 Tablet By Mouth Two Times A Day For Your Blood Pressure 4)  Vicodin 5-500 Mg Tabs (Hydrocodone-Acetaminophen) .... Take 1 Tablet By Mouth Two Times A Day 5)  Pravachol 40 Mg Tabs (Pravastatin Sodium) .... Take 1 Tab By Mouth At Bedtime 6)  Triamcinolone Acetonide 0.1 % Crea (Triamcinolone Acetonide) .... Apply Twice Daily To Affected Area.  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   Physical Exam  General:  alert, oriented, no distress.  Eyes:  anicteric.  Ears:  R canal and TM normal.  L canal w/ cerumen impaction that was unable to be extracted.  Small abrasion on lateral aspect of foot.  Neck:  no LAD. Lungs:  CTAB, nl effort. Heart:  RRR, no m/r/g.  Abdomen:  +BS's, soft, NT and ND. Extremities:  no peripheral edema.  callous  noted over lateral aspect of R foot.  Skin:  Erythmatous, scaly area of R knee, R lateral maleolous of the ankle and a couple of tiny spots on the L knee.  Cervical Nodes:  No lymphadenopathy noted Psych:  slightly anxious appearing.   Impression & Recommendations:  Problem # 1:  PSORIASIS (ICD-696.1) Exam c/w psoriasis on his R knee, ankle and minimally on his L knee.  Will increase steroid to higher potency for the short term to see if this helps.   Problem # 2:  CALLUS, RIGHT FOOT (ICD-700) We have no equipment here to cut it off.  He has been given neurontin by sport's medicine but stated he didn't want a medicine.  I offered amitriptyline as an alternative, which he declined.  I suggested he re-visit podiatry since they felt like he would benefit from a procedure.  I also suggested trying some over the counter supports.  Problem # 3:  ? of OTITIS MEDIA (ICD-382.9) Unable to visualize full TM b/c of cerumen impaction.  Canal appears fine.  He notes pain and purulent/bloody material leaking from the ear and feels like it is infected.  I tried to remove the cerumen unsuccessfully.  I will write for amoxil for presumed acute otits media and have him use mineral oil and avoid sticking anything in his ear.  Given hx of prosthetic TM, he would likely need to see ENT but declines referral at this time b/c of lack of insurance.    His updated medication list for this problem includes:    Aspirin 81 Mg Chew (Aspirin) .Marland Kitchen... Take 1 tablet by mouth once a day    Amoxicillin 500 Mg Caps (Amoxicillin) .Marland Kitchen... Take 1 tablet by mouth two times a day  Problem # 4:  HYPERTENSION (ICD-401.9) Several most recent BP readings have been high.  Will increase coreg to 12.5 two times a day.  His updated medication list for this problem includes:    Lisinopril-hydrochlorothiazide 20-12.5 Mg Tabs (Lisinopril-hydrochlorothiazide) .Marland Kitchen... Take 2 tablets by mouth once a day    Carvedilol 12.5 Mg Tabs (Carvedilol) .Marland Kitchen...  Take 1 tablet by mouth two times a day  BP today: 150/92 Prior BP: 158/97 (04/01/2009)  Prior 10 Yr Risk Heart Disease: Not enough information (09/06/2006)  Labs Reviewed: K+: 3.9 (01/11/2009) Creat: : 1.02 (01/11/2009)   Chol: 245 (01/11/2009)   HDL: 36 (01/11/2009)   LDL: 168 (01/11/2009)   TG: 204 (01/11/2009)  Complete Medication List: 1)  Aspirin 81 Mg Chew (Aspirin) .... Take 1 tablet by mouth once a day 2)  Lisinopril-hydrochlorothiazide 20-12.5 Mg Tabs (Lisinopril-hydrochlorothiazide) .... Take 2 tablets by mouth once a day 3)  Carvedilol 12.5 Mg Tabs (Carvedilol) .... Take 1 tablet by mouth two times a day 4)  Vicodin 5-500 Mg Tabs (Hydrocodone-acetaminophen) .... Take 1  tablet by mouth two times a day 5)  Pravachol 40 Mg Tabs (Pravastatin sodium) .... Take 1 tab by mouth at bedtime 6)  Betamethasone Dipropionate 0.05 % Oint (Betamethasone dipropionate) .... Apply to affected area twice daily. 7)  Amoxicillin 500 Mg Caps (Amoxicillin) .... Take 1 tablet by mouth two times a day  Patient Instructions: 1)  Please make a followup appointment in 1 month. 2)  Please take your antibiotic for your ear for 1 week. 3)  Please apply the new cream to your rash twice a day. 4)  Increase your blood pressure pill, carvedilol to 12.5 mg twice a day. Prescriptions: CARVEDILOL 12.5 MG TABS (CARVEDILOL) Take 1 tablet by mouth two times a day  #60 x 3   Entered and Authorized by:   Joaquin Courts  MD   Signed by:   Joaquin Courts  MD on 08/20/2009   Method used:   Electronically to        [facility] (retail)       [address]       [place], [place]  [address]       Ph: [phone]       Fax: [phone]   RxID:   [id number] AMOXICILLIN 500 MG CAPS (AMOXICILLIN) Take 1 tablet by mouth two times a day  #14 x 0   Entered and Authorized by:   Joaquin Courts  MD   Signed by:   Joaquin Courts  MD on 08/20/2009   Method used:   Electronically to          [facility] (retail)       [address]       [place], [place]  [address]       Ph: [phone]       Fax: [phone]   RxID:   [id number] BETAMETHASONE DIPROPIONATE 0.05 % OINT (BETAMETHASONE DIPROPIONATE) Apply to affected area twice daily.  #30 g tube x 0   Entered and Authorized by:   Joaquin Courts  MD   Signed by:   Joaquin Courts  MD on 08/20/2009   Method used:   Electronically to        [facility] (retail)       [address]       [place], [place]  [address]       Ph: [phone]       Fax: [phone]   RxID:   [id number]    Prevention & Chronic Care Immunizations   Influenza vaccine: Not documented   Influenza vaccine deferral: Refused  (04/01/2009)    Tetanus booster: 04/01/2009: Tdap    Pneumococcal vaccine: Not documented  Colorectal Screening   Hemoccult: Not documented    Colonoscopy: Not documented   Colonoscopy action/deferral: Refused  (04/01/2009)  Other Screening   PSA: 1.36  (12/20/2007)   PSA action/deferral: Not indicated  (04/01/2009)   Smoking status: never  (08/20/2009)  Lipids   Total Cholesterol: 245  (01/11/2009)   LDL: 168  (01/11/2009)   LDL Direct: Not documented   HDL: 36  (01/11/2009)   Triglycerides: 204  (01/11/2009)    SGOT (AST): 17  (01/11/2009)   SGPT (ALT): 22  (01/11/2009)   Alkaline phosphatase: 72  (01/11/2009)   Total bilirubin: 0.6  (01/11/2009)    Lipid flowsheet reviewed?: Yes   Progress toward LDL  goal: Unchanged  Hypertension   Last Blood Pressure: 150 / 92  (08/20/2009)   Serum creatinine: 1.02  (01/11/2009)   Serum potassium 3.9  (01/11/2009)    Hypertension flowsheet reviewed?: Yes   Progress toward BP goal: Unchanged  Self-Management Support :    Patient will work on the following items until the next clinic visit to reach self-care goals:     Medications and monitoring: take my medicines every  day  (08/20/2009)     Eating: eat more vegetables, use fresh or frozen vegetables, eat foods that are low in salt, eat fruit for snacks and desserts, limit or avoid alcohol  (08/20/2009)    Hypertension self-management support: Written self-care plan, Education handout, Resources for patients handout  (08/20/2009)   Hypertension self-care plan printed.   Hypertension education handout printed    Lipid self-management support: Written self-care plan, Education handout, Resources for patients handout  (08/20/2009)   Lipid self-care plan printed.   Lipid education handout printed      Resource handout printed.

## 2010-06-28 NOTE — Progress Notes (Signed)
----   Converted from flag ---- ---- 03/17/2010 9:23 AM, Neeton Moore CMA wrote: just talked to becky and gave her the info and he still has some appts schd for next week. told her to try and push him to some degree but she will lighten up on the theraband and call him and tell him he has to push thru a little.   ---- 03/16/2010 6:07 PM, Tonya Barkley CMA wrote: Neeton,  I was calling you to relay this information with you regarding this pt.  Dr. Neal would like for you to remind this pt that she informed him that he was going to have some pain when he started doing PT.  Becky with rehab cancelled his appt. because of this. So he will need to continue with PT and he can come in to see Dr. Neal if needed, also he will need a new PT order faxed over [phone]. should you need to speak with Becky her # is [phone].  Thanks for taking care of this.  Tonya ------------------------------ 

## 2010-06-28 NOTE — Progress Notes (Signed)
  Phone Note Other Incoming   Caller: ED PA Reason for Call: Confirm/change Appt, Discuss lab or test results Summary of Call: ED PA called about mr. Sefcik who seems to be having bursitis with infection. He is going to be started on keflex and doxycycline. He needs close follow up for resolution of symptoms. Please arrange for patient to be seen on Monday/Tuesday. The phone no is current.   Initial call taken by: Clerance Lav MD,  November 12, 2009 10:45 PM     Appended Document:  It appears our Mon and Tues schedule is full.  I can see him Tues - either before 11 AM or before 3:30 PM.    Appended Document:  Pt scheduled for 10:00 tuesday AM

## 2010-06-28 NOTE — Progress Notes (Signed)
  Phone Note Outgoing Call   Summary of Call: Ashauna Bertholf Please tell him the MRI shows he has IMPROVED significantly--there is stillsome inflammation there--there may be some residual infection---I want to starthim BACK on antibiotics and--make sure he has appt to see me in next 2 weeks he needs to LET us KNOW if anything changes like FEVER< worsening pain etc. Please call in antibiotic as below to whatever pharmacyhe wants. Tell him this is a DIFFERENT antibiotic so he should have less stomach upset Thanks!  Denny Levy MD  January 12, 2010 9:32 AM     New/Updated Medications: CEPHALEXIN 500 MG CAPS (CEPHALEXIN) 1 by mouth three times a day Prescriptions: CEPHALEXIN 500 MG CAPS (CEPHALEXIN) 1 by mouth three times a day  #42 x 1   Entered and Authorized by:   Denny Levy MD   Signed by:   Denny Levy MD on 01/12/2010   Method used:   Telephoned to ...       [facility] (retail)       [address]       [place], [place]  [address]       Ph: [phone]       Fax: [phone]   RxID:   [id number]  mssg left on pts machine.... he is to call us back with questions and to schd the 2 wk f/u. called in rx to [facility] Lillia Pauls [facility]  January 12, 2010 12:02 PM

## 2010-06-28 NOTE — Assessment & Plan Note (Signed)
Summary: HFU-PER DR DEVANI/CFB   Vital Signs:  Patient profile:   55 year old male Height:      68 inches (172.72 cm) Weight:      241.03 pounds (109.56 kg) BMI:     36.78 Temp:     98.5 degrees F (36.94 degrees C) oral Pulse rate:   81 / minute BP sitting:   116 / 73  (right arm)  Vitals Entered By: Angelina Ok RN (December 01, 2009 11:31 AM) Is Patient Diabetic? No Pain Assessment Patient in pain? yes     Location: left elbow Intensity: 5 Type: aching Onset of pain  Constant Nutritional Status BMI of > 30 = obese  Have you ever been in a relationship where you felt threatened, hurt or afraid?No   Does patient need assistance? Functional Status Self care Ambulation Normal Comments Pain in left elbow.  On an antibiotic for.  Pain in elbow is keeping him awake.   Primary Care Provider:  Mariea Stable MD   History of Present Illness: Mr Cianci is a 55 yo man with PMH as outlined in chart.  He is here for HFU for left olecranon bursitis with cellulitis and concern for deeper infection.  This was I&D'd by Dr. Jillyn Hidden.  There was no growth on cultures although pt had already been on Abx.  He was treated with a 14 day course of clindaymin after I&D, still taking.  Per pt, it continues to be slightly tender and stiff but better than previously.  Swelling and redness resolved.  Depression History:      The patient denies a depressed mood most of the day and a diminished interest in his usual daily activities.         Current Medications (verified): 1)  Aspirin 81 Mg Chew (Aspirin) .... Take 1 Tablet By Mouth Once A Day 2)  Lisinopril-Hydrochlorothiazide 20-12.5 Mg  Tabs (Lisinopril-Hydrochlorothiazide) .... Take 2 Tablets By Mouth Once A Day 3)  Carvedilol 12.5 Mg Tabs (Carvedilol) .... Take 1 Tablet By Mouth Two Times A Day 4)  Vicodin 5-500 Mg Tabs (Hydrocodone-Acetaminophen) .... Take 1 Tablet By Mouth Two Times A Day 5)  Pravachol 40 Mg Tabs (Pravastatin Sodium) .... Take 1  Tab By Mouth At Bedtime 6)  Betamethasone Dipropionate 0.05 % Oint (Betamethasone Dipropionate) .... Apply To Affected Area Twice Daily. 7)  Clindamycin Hcl 300 Mg Caps (Clindamycin Hcl) .... Take 1 Tablet By Mouth Three Times A Day  Allergies (verified): No Known Drug Allergies  Past History:  Past Medical History: GERD Hyperlipidemia Hypertension Depression Atypical chest pain, negative cardiolyte 8/05 ACL tear, R, chronic B/L meniscal tears, chronic Corneal abrasion, hx of Erectile Dysfunction Skin tags, removed Insomnia Eczema L olecranon bursitis with cellulits s/p I&D by Dr. Jillyn Hidden, initially assessed by Dr. Jennette Kettle. (10/2009)  Review of Systems      See HPI  Physical Exam  General:  alert, well-developed, and normal appearance.   Eyes:  anicteric Lungs:  normal respiratory effort and no accessory muscle use.   Msk:  no redness or swelling of left elbow.  minimal tenderness over olecranon/distal triceps tendon. Neurologic:  alert & oriented X3 and gait normal.   Psych:  Oriented X3, memory intact for recent and remote, and normally interactive.      Impression & Recommendations:  Problem # 1:  BURSITIS, LEFT ELBOW (ICD-726.33) Markedly improved according to information i have available and per pt. Still has some stiffness and tenderness.  Will continue with clindaymycin, though  cultures were negative (pt had already been on oral abx) Will arrange f/u with Dr. Jennette Kettle for further suggestions, ? PT ?  Complete Medication List: 1)  Aspirin 81 Mg Chew (Aspirin) .... Take 1 tablet by mouth once a day 2)  Lisinopril-hydrochlorothiazide 20-12.5 Mg Tabs (Lisinopril-hydrochlorothiazide) .... Take 2 tablets by mouth once a day 3)  Carvedilol 12.5 Mg Tabs (Carvedilol) .... Take 1 tablet by mouth two times a day 4)  Vicodin 5-500 Mg Tabs (Hydrocodone-acetaminophen) .... Take 1 tablet by mouth two times a day 5)  Pravachol 40 Mg Tabs (Pravastatin sodium) .... Take 1 tab by mouth at  bedtime 6)  Betamethasone Dipropionate 0.05 % Oint (Betamethasone dipropionate) .... Apply to affected area twice daily. 7)  Clindamycin Hcl 300 Mg Caps (Clindamycin hcl) .... Take 1 tablet by mouth three times a day  Patient Instructions: 1)  Please schedule a follow-up appointment in 1 month. 2)  Complete the clindamycin (antibiotic). 3)  Will arrange follow up with Dr. Jennette Kettle. 4)  If you have any other problem, call clinic.   Prevention & Chronic Care Immunizations   Influenza vaccine: Not documented   Influenza vaccine deferral: Refused  (04/01/2009)    Tetanus booster: 04/01/2009: Tdap    Pneumococcal vaccine: Not documented  Colorectal Screening   Hemoccult: Not documented    Colonoscopy: Not documented   Colonoscopy action/deferral: Refused  (04/01/2009)  Other Screening   PSA: 1.36  (12/20/2007)   PSA action/deferral: Not indicated  (04/01/2009)   Smoking status: never  (11/16/2009)  Lipids   Total Cholesterol: 245  (01/11/2009)   LDL: 168  (01/11/2009)   LDL Direct: Not documented   HDL: 36  (01/11/2009)   Triglycerides: 204  (01/11/2009)    SGOT (AST): 17  (01/11/2009)   SGPT (ALT): 22  (01/11/2009)   Alkaline phosphatase: 72  (01/11/2009)   Total bilirubin: 0.6  (01/11/2009)  Hypertension   Last Blood Pressure: 116 / 73  (12/01/2009)   Serum creatinine: 1.02  (01/11/2009)   Serum potassium 3.9  (01/11/2009)  Self-Management Support :    Patient will work on the following items until the next clinic visit to reach self-care goals:     Medications and monitoring: take my medicines every day, bring all of my medications to every visit  (12/01/2009)     Eating: drink diet soda or water instead of juice or soda, eat more vegetables, use fresh or frozen vegetables, eat foods that are low in salt, eat baked foods instead of fried foods, eat fruit for snacks and desserts, limit or avoid alcohol  (12/01/2009)     Activity: take a 30 minute walk every day   (12/01/2009)    Hypertension self-management support: Written self-care plan, Education handout, Pre-printed educational material, Resources for patients handout  (12/01/2009)   Hypertension self-care plan printed.   Hypertension education handout printed    Lipid self-management support: Written self-care plan, Education handout, Pre-printed educational material, Resources for patients handout  (12/01/2009)   Lipid self-care plan printed.   Lipid education handout printed      Resource handout printed.     Vital Signs:  Patient profile:   55 year old male Height:      68 inches (172.72 cm) Weight:      241.03 pounds (109.56 kg) BMI:     36.78 Temp:     98.5 degrees F (36.94 degrees C) oral Pulse rate:   81 / minute BP sitting:   116 / 73  (right  arm)  Vitals Entered By: Angelina Ok RN (December 01, 2009 11:31 AM)

## 2010-06-28 NOTE — Assessment & Plan Note (Signed)
Summary: TO SEE [name] @130PM  OK TO ADD ON PER GAYLE/CH   Vital Signs:  Patient profile:   55 year old male Height:      68.5 inches (173.99 cm) Weight:      239.4 pounds (108.82 kg) BMI:     36.00 Temp:     97.0 degrees F (36.11 degrees C) oral Pulse rate:   75 / minute BP sitting:   129 / 80  (right arm)  Vitals Entered By: Stanton Kidney Ditzler RN (November 16, 2009 1:39 PM) Is Patient Diabetic? No Pain Assessment Patient in pain? yes     Location: left elbow Intensity: ? Type: throbbing Onset of pain  past week Nutritional Status BMI of > 30 = obese Nutritional Status Detail appetite fair  Have you ever been in a relationship where you felt threatened, hurt or afraid?denies   Does patient need assistance? Functional Status Self care Ambulation Normal Comments Been to ER x 2 for left elbow - no change.   Primary Care Provider:  Mariea Stable MD   History of Present Illness: [name] is a 55 yo male who has had 1.5 weeks of L elbow pain.  He was cleaning some gutters and finished up.  When he put his elbow on the arm reat of his car he noticed some tingling.  It progressed to maximal pain within 5 hrs.  The next day he had subjective fevers, felt cold and feverish, had cough.  Those sxs have imrpoved.  Pain has progressed.  All the time - feels his heart beat in the elbow.  Worse with movement.  Can't sleep.  No numbness no weakness.  Really can't move that elbow at all.  Taking Hydrocodone and only minimally helps.    Never had sxs like this before.  Has torn ACL that causes knee pain.  No other joint pain, no H/O gout.  No rash.  No septic sxs.    Saw ER and got Xray - no fracture. Was Rx Mobic.   Returned to ER on 6/17 - labs nl except slightly elevated WBC. Disgnosed with infection and Rx Keflex and doxy.  Pt is taking those without improvment.    Has no insurance.  Hasn't seen [facility] yet.  Depression History:      The patient denies a depressed mood most of the day  and a diminished interest in his usual daily activities.         Preventive Screening-Counseling & Management  Alcohol-Tobacco     Smoking Status: never  Caffeine-Diet-Exercise     Does Patient Exercise: yes     Type of exercise: WALKING  Allergies: No Known Drug Allergies  Review of Systems General:  Complains of chills, fatigue, fever, sleep disorder, and sweats; These sxs have resolved except the sleep D/O which is chronic.Marland Kitchen Resp:  Complains of cough; Sx has resolved.. MS:  Complains of joint pain, joint redness, and joint swelling; denies loss of strength. Derm:  Complains of changes in color of skin and itching; denies rash. Neuro:  Denies weakness.  Physical Exam  General:  alert and well-developed.   Head:  normocephalic and atraumatic.   Eyes:  vision grossly intact and pupils equal.   Ears:  R ear normal and L ear normal.   Msk:  L elbow tender over ulnar nerve.  No weakness.  No numbness in ulner nerve distribution.  Sig pain with wrist extension, some pain with wrist flexion.  Swelliing and minimal erythema over olecranon bursa.  Limitation of full elbow extension.  Decreased suppination and protanation.   Neurologic:  alert & oriented X3 and gait normal.   Skin:  color normal, no rashes, and no suspicious lesions.     Impression & Recommendations:  Problem # 1:  ELBOW PAIN, LEFT (ICD-719.42)  Pain has progressed despite addition of ABX.  Pt not septic.  This makes me think of alternate diagnsosis.  Gout - but elbow not most common location of gout and by now pain should be decreasing.  Ulnar nerve impingment - but no sensory sxs.  Intraarticular infection - sed rate was nl, inability to straighten elbow is c/w with this dx, no improvment on ABX.     Pt has no insurance and over the limit for hte orange card.  I think that a sports medicine referral might be most cost effective and quick way to get an answer to what is going on.    Plan: Referral to sports  medicine - appt 24th at 9:15 DrNeal Cont ABX Take a NSAID  Orders: Sports Medicine (Sports Med)  Complete Medication List: 1)  Aspirin 81 Mg Chew (Aspirin) .... Take 1 tablet by mouth once a day 2)  Lisinopril-hydrochlorothiazide 20-12.5 Mg Tabs (Lisinopril-hydrochlorothiazide) .... Take 2 tablets by mouth once a day 3)  Carvedilol 12.5 Mg Tabs (Carvedilol) .... Take 1 tablet by mouth two times a day 4)  Vicodin 5-500 Mg Tabs (Hydrocodone-acetaminophen) .... Take 1 tablet by mouth two times a day 5)  Pravachol 40 Mg Tabs (Pravastatin sodium) .... Take 1 tab by mouth at bedtime 6)  Betamethasone Dipropionate 0.05 % Oint (Betamethasone dipropionate) .... Apply to affected area twice daily. 7)  Amoxicillin 500 Mg Caps (Amoxicillin) .... Take 1 tablet by mouth two times a day

## 2010-06-28 NOTE — Assessment & Plan Note (Signed)
Summary: ELBOW PAIN,MC   Vital Signs:  Patient profile:   55 year old male BP sitting:   142 / 87  Vitals Entered By: Lillia Pauls CMA (February 14, 2010 4:17 PM)  Primary Care Provider:  Mariea Stable MD   History of Present Illness: f/u left elbow pain. Has completed the antibiotics--he is better but still having pain, particularly at night. Keeps him from going to sleep. He has been using one or two vicodin wit good pain relief at night--usually one tab does the trick.  Pain is in joint still, worse with activity, especially lifting. No fever, no sweat, no chills. He feels otherwise well. No nymbness or tingling in hand.  Current Medications (verified): 1)  Aspirin 81 Mg Chew (Aspirin) .... Take 1 Tablet By Mouth Once A Day 2)  Lisinopril-Hydrochlorothiazide 20-12.5 Mg  Tabs (Lisinopril-Hydrochlorothiazide) .... Take 2 Tablets By Mouth Once A Day 3)  Carvedilol 12.5 Mg Tabs (Carvedilol) .... Take 1 Tablet By Mouth Two Times A Day 4)  Pravachol 40 Mg Tabs (Pravastatin Sodium) .... Take 1 Tab By Mouth At Bedtime 5)  Betamethasone Dipropionate 0.05 % Oint (Betamethasone Dipropionate) .... Apply To Affected Area Twice Daily. 6)  Hydrocodone-Acetaminophen 5-500 Mg Tabs (Hydrocodone-Acetaminophen) .... Take One Pill By Mouth Every 4 Hours As Needed For Severe R Knee Pain.  Allergies (verified): No Known Drug Allergies  Past History:  Past Surgical History: Removal of 11 skin tags Knee arthroscopy open drainage left elbow for cellulitis 2011  Physical Exam  General:  alert, well-developed, well-nourished, and well-hydrated.     Shoulder/Elbow Exam  Sensory:    normal sensation to soft touch LUE.   Motor:    grip strength 5/5 Bilaterally symmetrical.  Reflexes:    DTR 2+ = B elbow  Elbow Exam:    Left:    Inspection:  Normal    Palpation:  Normal    Stability:  stable    Tenderness:  left medial epicondyle very mild tenderness to deep palpation    Swelling:   no    Erythema:  no    full flexion at elbow--extension lacks 5-10 degrees of full. painless no elbow effusion   Impression & Recommendations:  Problem # 1:  CELLULITIS AND ABSCESS OF UPPER ARM AND FOREARM (ICD-682.3) he is improved--still residual pain with activity--I think residual myositis type inflammation. Wil continue low dose pain med at night. start formal PT. rtc 4 w.  Complete Medication List: 1)  Aspirin 81 Mg Chew (Aspirin) .... Take 1 tablet by mouth once a day 2)  Lisinopril-hydrochlorothiazide 20-12.5 Mg Tabs (Lisinopril-hydrochlorothiazide) .... Take 2 tablets by mouth once a day 3)  Carvedilol 12.5 Mg Tabs (Carvedilol) .... Take 1 tablet by mouth two times a day 4)  Pravachol 40 Mg Tabs (Pravastatin sodium) .... Take 1 tab by mouth at bedtime 5)  Betamethasone Dipropionate 0.05 % Oint (Betamethasone dipropionate) .... Apply to affected area twice daily. 6)  Hydrocodone-acetaminophen 5-500 Mg Tabs (Hydrocodone-acetaminophen) .... Take one pill by mouth every 4 hours as needed for severe r knee pain. Prescriptions: HYDROCODONE-ACETAMINOPHEN 5-500 MG TABS (HYDROCODONE-ACETAMINOPHEN) Take one pill by mouth every 4 hours as needed for severe R knee pain.  #30 x 0   Entered and Authorized by:   Denny Levy MD   Signed by:   Denny Levy MD on 02/14/2010   Method used:   Print then Give to Patient   RxID:   [id number]

## 2010-06-28 NOTE — Progress Notes (Signed)
Summary: L elbow/ hla  Phone Note Call from Patient   Summary of Call: pt states sat 6/11  after cleaning his gutters, he noticed his L elbow stinging, painful and swelling also became red, hot to touch finally went to [facility], was diagnosed w/ bursitis, given anti-inflammatory and now at this point it is worse, cannot bend arm, more swollen, red except for area that is dark, almost brusied color but darker than a bruise. he also reports nausea, chills and general malaise. i spoke w/ dr Midwife and he is referred to [facility] asap. pt is agreeable Initial call taken by: Marin Roberts RN,  November 12, 2009 12:42 PM  Follow-up for Phone Call        I agree woth helen's note.   Follow-up by: Blanch Media MD,  November 12, 2009 1:38 PM

## 2010-06-28 NOTE — Progress Notes (Signed)
Summary: phone/gg  Phone Note From Pharmacy   Summary of Call: Call from [facility], pharmacist asking for clarification of med Rx he received. Order is for Clindamycin 300 mg capsule, 1.5 capsules 3 times a day for 2 weeks #45  Initial call taken by: Merrie Roof RN,  November 25, 2009 3:12 PM  Follow-up for Phone Call        Correction Clindamycin 300mg  by mouth three times a day -will call pharmacy to clarify for the inpatient service. Follow-up by: Julaine Fusi  DO,  November 25, 2009 3:29 PM  Additional Follow-up for Phone Call Additional follow up Details #1::        Phone call completed Additional Follow-up by: Merrie Roof RN,  November 25, 2009 3:51 PM    New/Updated Medications: CLINDAMYCIN HCL 300 MG CAPS (CLINDAMYCIN HCL) Take 1 tablet by mouth three times a day Prescriptions: CLINDAMYCIN HCL 300 MG CAPS (CLINDAMYCIN HCL) Take 1 tablet by mouth three times a day  #42 x 0   Entered and Authorized by:   Julaine Fusi  DO   Signed by:   Julaine Fusi  DO on 11/25/2009   Method used:   Electronically to        [facility] (retail)       [address]       [place], [place]  [address]       Ph: [phone]       Fax: [phone]   RxID:   [id number]

## 2010-06-28 NOTE — Initial Assessments (Signed)
Summary: L ELBOW PAIN,MC   Vital Signs:  Patient profile:   55 year old male Height:      68 inches Weight:      230 pounds O2 Sat:      97 % Pulse rate:   78 / minute Resp:     26 per minute BP sitting:   130 / 84  Vitals Entered By: Lillia Pauls CMA (November 19, 2009 9:27 AM)  Primary Care Provider:  Mariea Stable MD   History of Present Illness: Insidious development of left elbow pain, erythema, and swelling approximately 2 weeks ago. Noticed this pain while resting left elbow on car arm rest. No paresthesias. Had an episode of chills the following day. Presented to a local emergency department where x-rays did not reveal an acute process. Dx'ed with non-infectious bursitis and discharged to home with NSAID Rx. Has been taking NSAID as rx'ed. Pain worsened; prompting patient to report to [facility] ED. X-rays did not reveal an acute process. Discharged to home on doxycycline and clindamycin. Taking abx as rx'ed since 11/12/2009. No fevers in the interim but states he does not feel very well. Overall, the area of swelling and erythema has decreased in size. However, patient notes increased prominence, pain, and erythema overlying olecranon area.  No prior episodes of joint erythema, swelling, or infections. No prior left elbow injuries or procedures. No recent surgeries or dental procedures.  Allergies (verified): No Known Drug Allergies  Physical Exam  General:  Well-developed,well-nourished,in no acute distress; alert,appropriate and cooperative throughout examination. Appears uncomfortable 2/2 left elbow pain. Neck:  No LAD. Lungs:  Good aeration overall. Mildly decreased aeration RLL field. Some non-productive coughing on deep inspiration. Faint RLL crackles during coughing episode. Heart:  RRR. Normal S1/S2. No m/r/g. 2+ bilateral rad/dp pulses. No edema. Abdomen:  (+) BS. Soft. NT/ND. Msk:  Focal erythematous area of ttp, slight fluctuance, and increased  warmth overlying left olecranon. Surround area of slight erythema, ttp, and increased warmth. Skin is intact without apparent wounds. No drainage.  * Inner/Outer borders marked during this encounter.  Limited elbow extension and flexion 2/2 pain and sensation of pressure inside the left elbow joint. Pulses:  2+ bilateral radial pulses. <2 sec cap refil throughout fingers bilaterally. Neurologic:  Sensation intact throughout the upper extremities. Full ROM and strength throughout the left wrist and fingers though patient reluctant to grip with full strength 2/2 reproduced left elbow pain. Additional Exam:  LAB REVIEW: WBC 12.5 ESR 24   LEFT ELBOW X-RAYS: Images taken at [facility] ED viewed during this encounter did not reveal peri-osteal rx or other acute process.   MUSCULOSKELETAL ULTRASOUND: Longitudinal and transverse views of the left elbow at the site of tenderness and erythema revealed the following: 1) Notably increased soft-tissue and cortical doppler flow with respect to the olecranon.  2) Soft tissue swelling with hypo-echogenic fluid collection above the olecranon. 3) Question of increased bursal fluid collection beneath distral triceps proximal to olecranon insertion.   Impression & Recommendations:  Problem # 1:  ELBOW PAIN, LEFT (ICD-719.42) Current clinical picture suggests and infectious process with risk for development of osteomyelitis.  - Will admit to the Family Medicine Service (Attending: Dr. Mauricio Po) for further management including imaging, lab tests, and IV antibiotics. - Will place inpatient Orthopedics consult.  Problem # 2:  COUGH (ICD-786.2) Possible bronchitis given sinus symptoms and post-nasal drip.  - Will order a PA/Lat CXR to r/o pneumonia given question of chills and clinical exam. -  Will determine further mgmt based on x-ray and clinical picture. ATTENDING NOTE: ADMISSIOn I have seen and examined Mr Bennison here in the sports medicine center. I  have reviewed his [place] and other tests, discussed with Dr Fredric Mare and agree with assessment  and plan as above. Briefy: left elbow infection--I think this started out as an olecranon bursitis nut I am now concerned for septic joint / osteomyelitis Admit, IV abx, MRI left elbow  Complete Medication List: 1)  Aspirin 81 Mg Chew (Aspirin) .... Take 1 tablet by mouth once a day 2)  Lisinopril-hydrochlorothiazide 20-12.5 Mg Tabs (Lisinopril-hydrochlorothiazide) .... Take 2 tablets by mouth once a day 3)  Carvedilol 12.5 Mg Tabs (Carvedilol) .... Take 1 tablet by mouth two times a day 4)  Vicodin 5-500 Mg Tabs (Hydrocodone-acetaminophen) .... Take 1 tablet by mouth two times a day 5)  Pravachol 40 Mg Tabs (Pravastatin sodium) .... Take 1 tab by mouth at bedtime 6)  Betamethasone Dipropionate 0.05 % Oint (Betamethasone dipropionate) .... Apply to affected area twice daily. 7)  Amoxicillin 500 Mg Caps (Amoxicillin) .... Take 1 tablet by mouth two times a day  Other Orders: [place] LIMITED ([id number])

## 2010-06-28 NOTE — Progress Notes (Signed)
Summary: refill/gg  Phone Note Refill Request  on April 05, 2010 4:17 PM  Refills Requested: Medication #1:  HYDROCODONE-ACETAMINOPHEN 5-500 MG TABS Take one pill by mouth every 4 hours as needed for severe R knee pain..   Last Refilled: 03/09/2010 # 30   Method Requested: Fax to Local Pharmacy Initial call taken by: Merrie Roof RN,  April 05, 2010 4:18 PM  Follow-up for Phone Call        Refill approved-nurse to complete  Being used for elbow pain, evaluated by Dr. Jennette Kettle and getting PT.  Will need to re-evaluate for continued need if requires another refill. Follow-up by: Mariea Stable MD,  April 06, 2010 3:04 PM  Additional Follow-up for Phone Call Additional follow up Details #1::        Rx faxed to pharmacy Additional Follow-up by: Merrie Roof RN,  April 08, 2010 2:34 PM    Prescriptions: HYDROCODONE-ACETAMINOPHEN 5-500 MG TABS (HYDROCODONE-ACETAMINOPHEN) Take one pill by mouth every 4 hours as needed for severe R knee pain.  #30 x 0   Entered and Authorized by:   Mariea Stable MD   Signed by:   Mariea Stable MD on 04/06/2010   Method used:   Telephoned to ...       [facility] (retail)       [address]       [place], [place]  [address]       Ph: [phone]       Fax: [phone]   RxID:   [id number]

## 2010-06-28 NOTE — Progress Notes (Signed)
----   Converted from flag ---- ---- 03/17/2010 9:23 AM, Lillia Pauls CMA wrote: just talked to becky and gave her the info and he still has some appts schd for next week. told her to try and push him to some degree but she will lighten up on the theraband and call him and tell him he has to push thru a little.   ---- 03/16/2010 6:07 PM, Loralee Pacas CMA wrote: Arelia Longest,  I was calling you to relay this information with you regarding this pt.  Dr. Jennette Kettle would like for you to remind this pt that she informed him that he was going to have some pain when he started doing PT.  Becky with rehab cancelled his appt. because of this. So he will need to continue with PT and he can come in to see Dr. Jennette Kettle if needed, also he will need a new PT order faxed over [phone]. should you need to speak with Kriste Basque her # is [phone].  Thanks for taking care of this.  Tonya ------------------------------

## 2010-06-28 NOTE — Progress Notes (Signed)
Summary: med refill/gp  Phone Note Refill Request Message from:  Fax from Pharmacy on March 10, 2010 9:20 AM  Refills Requested: Medication #1:  CARVEDILOL 12.5 MG TABS Take 1 tablet by mouth two times a day   Last Refilled: 02/17/2010 Last appt. 12/01/09.   Method Requested: Electronic Initial call taken by: Chinita Pester RN,  March 10, 2010 9:21 AM  Follow-up for Phone Call        sent via electronic refill request Follow-up by: Mariea Stable MD,  March 10, 2010 12:15 PM

## 2010-06-28 NOTE — Discharge Summary (Signed)
Summary: Hospital Discharge Update (left elbow bursitis)    Hospital Discharge Update:  Date of Admission: 11/19/2009 Date of Discharge: 11/23/2009  Brief Summary:  left elbow bursitis- s/p I&D.  Other follow-up issues:  pain control. If not better please schedule outpatient appointment with orthopedic Dr. Shelle Iron [facility]) fof furthur evaluation  Problem list changes:  Added new problem of BURSITIS, LEFT ELBOW (ICD-726.33) - Signed  Medication list changes:  Removed medication of AMOXICILLIN 500 MG CAPS (AMOXICILLIN) Take 1 tablet by mouth two times a day - Signed Added new medication of DOXYCYCLINE HYCLATE 100 MG SOLR (DOXYCYCLINE HYCLATE) Take 1 tablet by mouth two times a day for 2 weeks - Signed Changed medication from DOXYCYCLINE HYCLATE 100 MG SOLR (DOXYCYCLINE HYCLATE) Take 1 tablet by mouth two times a day for 2 weeks to CLINDAMYCIN HCL 300 MG CAPS (CLINDAMYCIN HCL) 1.5 tab three times a day for 2 weeks - Signed Rx of DOXYCYCLINE HYCLATE 100 MG SOLR (DOXYCYCLINE HYCLATE) Take 1 tablet by mouth two times a day for 2 weeks;  #30 x 0;  Signed;  Entered by: Bethel Born MD;  Authorized by: Bethel Born MD;  Method used: Electronically to [facility] [address] [place], [place], [place]  [address], Ph: [phone], Fax: [phone] Rx of CLINDAMYCIN HCL 300 MG CAPS (CLINDAMYCIN HCL) 1.5 tab three times a day for 2 weeks;  #45 x 0;  Signed;  Entered by: Bethel Born MD;  Authorized by: Bethel Born MD;  Method used: Print then Give to Patient  The medication, problem, and allergy lists have been updated.  Please see the dictated discharge summary for details.  Discharge medications:  ASPIRIN 81 MG CHEW (ASPIRIN) Take 1 tablet by mouth once a day LISINOPRIL-HYDROCHLOROTHIAZIDE 20-12.5 MG  TABS (LISINOPRIL-HYDROCHLOROTHIAZIDE) Take 2 tablets by mouth once a day CARVEDILOL 12.5 MG TABS (CARVEDILOL) Take 1 tablet by mouth two times a day VICODIN  5-500 MG TABS (HYDROCODONE-ACETAMINOPHEN) Take 1 tablet by mouth two times a day PRAVACHOL 40 MG TABS (PRAVASTATIN SODIUM) Take 1 tab by mouth at bedtime BETAMETHASONE DIPROPIONATE 0.05 % OINT (BETAMETHASONE DIPROPIONATE) Apply to affected area twice daily. CLINDAMYCIN HCL 300 MG CAPS (CLINDAMYCIN HCL) 1.5 tab three times a day for 2 weeks  Other patient instructions:  Follow up in the outpatient clinic on 12/01/2009 at 11:30 AM  Call the clinic or come to the ED if yout pain and swelling gets worse in the meantime.     Note: Hospital Discharge Medications & Other Instructions handout was printed, one copy for patient and a second copy to be placed in hospital chart.  Prescriptions: CLINDAMYCIN HCL 300 MG CAPS (CLINDAMYCIN HCL) 1.5 tab three times a day for 2 weeks  #45 x 0   Entered and Authorized by:   Bethel Born MD   Signed by:   Bethel Born MD on 11/23/2009   Method used:   Print then Give to Patient   RxID:   [id number] DOXYCYCLINE HYCLATE 100 MG SOLR (DOXYCYCLINE HYCLATE) Take 1 tablet by mouth two times a day for 2 weeks  #30 x 0   Entered and Authorized by:   Bethel Born MD   Signed by:   Bethel Born MD on 11/23/2009   Method used:   Electronically to        [facility] (retail)       [address]       [place], [place]  [address]       Ph: [phone]       Fax: [phone]   RxID:   [id number]  please disregard doxycycline prescription

## 2010-06-30 NOTE — Progress Notes (Signed)
Summary: Refill/gh  Phone Note Refill Request Message from:  Fax from Pharmacy on June 20, 2010 11:00 AM  Refills Requested: Medication #1:  HYDROCODONE-ACETAMINOPHEN 5-500 MG TABS Take one pill by mouth every 4 hours as needed for severe R knee pain..   Last Refilled: 05/04/2010  Method Requested: Electronic Initial call taken by: Angelina Ok RN,  June 20, 2010 11:00 AM  Follow-up for Phone Call        Refill approved-nurse to complete Follow-up by: Mariea Stable MD,  June 20, 2010 1:55 PM  Additional Follow-up for Phone Call Additional follow up Details #1::        Rx called to pharmacy Additional Follow-up by: Angelina Ok RN,  June 20, 2010 3:50 PM    Prescriptions: HYDROCODONE-ACETAMINOPHEN 5-500 MG TABS (HYDROCODONE-ACETAMINOPHEN) Take one pill by mouth every 4 hours as needed for severe R knee pain.  #30 x 0   Entered and Authorized by:   Mariea Stable MD   Signed by:   Mariea Stable MD on 06/20/2010   Method used:   Telephoned to ...       [facility] (retail)       [address]       [place], [place]  [address]       Ph: [phone]       Fax: [phone]   RxID:   [id number]

## 2010-06-30 NOTE — Assessment & Plan Note (Signed)
Summary: ACUTE-EAR PAIN-(ALVAREZ)/CFB   Vital Signs:  Patient profile:   55 year old male Height:      68 inches (172.72 cm) Weight:      249.3 pounds (113.32 kg) BMI:     38.04 Temp:     98.5 degrees F (36.94 degrees C) oral Pulse rate:   70 / minute BP sitting:   109 / 74  (left arm)  Vitals Entered By: Stanton Kidney Ditzler RN (June 23, 2010 10:43 AM) Is Patient Diabetic? No Pain Assessment Patient in pain? no      Nutritional Status BMI of > 30 = obese Nutritional Status Detail appetite good  Have you ever been in a relationship where you felt threatened, hurt or afraid?denies   Does patient need assistance? Functional Status Self care Ambulation Normal Comments Past 3 months problems hearing. Feels like head in barrel, popping sounds and roaring noise. Has plastic ear drum left ear.   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 He has not tried any other medicaitons for this problem. He denies fever, ear pain, discharge, or redness  He has seen Dr. Andrey Campanile for this in the past and at that time he had a lot of ear wax and was asked to use ear drops to clean the wx. He did not use the drops much but is using a lot of q-tips   Depression History:      The patient denies a depressed mood most of the day and a diminished interest in his usual daily activities.         Preventive Screening-Counseling & Management  Alcohol-Tobacco     Smoking Status: never  Caffeine-Diet-Exercise     Does Patient Exercise: yes     Type of exercise: WALKING  Current Medications (verified): 1)  Aspirin 81 Mg Chew (Aspirin) .... Take 1 Tablet By Mouth Once A Day 2)  Lisinopril-Hydrochlorothiazide 20-12.5 Mg  Tabs (Lisinopril-Hydrochlorothiazide) .... Take 2 Tablets By Mouth Once A Day 3)   Carvedilol 12.5 Mg Tabs (Carvedilol) .... Take 1 Tablet By Mouth Two Times A Day 4)  Pravachol 40 Mg Tabs (Pravastatin Sodium) .... Take 1 Tab By Mouth At Bedtime 5)  Betamethasone Dipropionate 0.05 % Oint (Betamethasone Dipropionate) .... Apply To Affected Area Twice Daily. 6)  Hydrocodone-Acetaminophen 5-500 Mg Tabs (Hydrocodone-Acetaminophen) .... Take One Pill By Mouth Every 4 Hours As Needed For Severe R Knee Pain.  Allergies (verified): No Known Drug Allergies  Review of Systems  The patient denies anorexia, fever, weight loss, weight gain, vision loss, decreased hearing, hoarseness, chest pain, syncope, dyspnea on exertion, peripheral edema, prolonged cough, headaches, hemoptysis, abdominal pain, melena, hematochezia, severe indigestion/heartburn, hematuria, incontinence, genital sores, muscle weakness, suspicious skin lesions, transient blindness, difficulty walking, depression, unusual weight change, abnormal bleeding, enlarged lymph nodes, angioedema, breast masses, and testicular masses.    Physical Exam  General:  Gen: VS reveiwed, Alert, well developed, nodistress ENT: mucous membranes pink & moist. No abnormal finds in ear and nose. CVC:S1 S2 , no murmurs, no abnormal heart sounds. Lungs: Clear to auscultation B/L. No wheezes, crackles or other abnormal sounds Abdomen: soft, non distended, no tender. Normal Bowel sounds EXT: no pitting edema, no engorged veins, Pulsations normal  Neuro:alert, oriented *3, cranial nerved 2-12 intact, strenght  normal in all  extremities, senstations normal to light touch.    Ears:  no external deformities.  no tenderness on exam, mild blood in both ear canal from q-tip use probably. 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 He has hearing difficulty, questionable otitis media, eustachian tube  dysfunction and tinnitus. His external auditory canal looks uninfected at this time but he could have chornic middle ear infection He needs to be seen by an ENT doctor soon but lack of insurance is a problem. He also does not have the orange card. Will refer [facility] to [facility] for assessment and management.   Orders: ENT Referral (ENT) ( possibly [facility] given lack of insurance)  Complete Medication List: 1)  Aspirin 81 Mg Chew (Aspirin) .... Take 1 tablet by mouth once a day 2)  Lisinopril-hydrochlorothiazide 20-12.5 Mg Tabs (Lisinopril-hydrochlorothiazide) .... Take 2 tablets by mouth once a day 3)  Carvedilol 12.5 Mg Tabs (Carvedilol) .... Take 1 tablet by mouth two times a day 4)  Pravachol 40 Mg Tabs (Pravastatin sodium) .... Take 1 tab by mouth at bedtime 5)  Betamethasone Dipropionate 0.05 % Oint (Betamethasone dipropionate) .... Apply to affected area twice daily. 6)  Hydrocodone-acetaminophen 5-500 Mg Tabs (Hydrocodone-acetaminophen) .... Take one pill by mouth every 4 hours as needed for severe r knee pain.  Patient Instructions: 1)  Please schedule a follow-up appointment in 2 months. 2)  You need to see and Ear doctor as soon as possible. Return to the clinc if you have pain, ear discharge or fever   Orders Added: 1)  ENT Referral [ENT]     Prevention & Chronic Care Immunizations   Influenza vaccine: Not documented   Influenza vaccine deferral: Refused  (04/01/2009)    Tetanus booster: 04/01/2009: Tdap    Pneumococcal vaccine: Not documented  Colorectal Screening   Hemoccult: Not documented    Colonoscopy: Not documented   Colonoscopy action/deferral: Refused  (04/01/2009)  Other Screening   PSA: 1.36  (12/20/2007)   PSA action/deferral: Not indicated  (04/01/2009)   Smoking status: never  (06/23/2010)  Lipids   Total Cholesterol: 245  (01/11/2009)   LDL: 168  (01/11/2009)   LDL Direct: Not documented   HDL: 36  (01/11/2009)    Triglycerides: 204  (01/11/2009)    SGOT (AST): 17  (01/11/2009)   SGPT (ALT): 22  (01/11/2009)   Alkaline phosphatase: 72  (01/11/2009)   Total bilirubin: 0.6  (01/11/2009)  Hypertension   Last Blood Pressure: 109 / 74  (06/23/2010)   Serum creatinine: 1.02  (01/11/2009)   Serum potassium 3.9  (01/11/2009)  Self-Management Support :    Patient will work on the following items until the next clinic visit to reach self-care goals:     Medications and monitoring: take my medicines every day, bring all of my medications to every visit  (12/01/2009)     Eating: drink diet soda or water instead of juice or soda, eat more vegetables, use fresh or frozen vegetables, eat foods that are low in salt, eat baked foods instead of fried foods, eat fruit for snacks and desserts, limit or avoid alcohol  (12/01/2009)     Activity: take a 30 minute walk every day  (12/01/2009)     Other: no change  (06/23/2010)    Hypertension self-management support: Written self-care plan, Education handout, Pre-printed educational  material, Resources for patients handout  (12/01/2009)    Lipid self-management support: Written self-care plan, Education handout, Pre-printed educational material, Resources for patients handout  (12/01/2009)

## 2010-07-28 ENCOUNTER — Other Ambulatory Visit: Payer: Self-pay | Admitting: Internal Medicine

## 2010-08-14 LAB — POCT I-STAT, CHEM 8
BUN: 16 mg/dL (ref 6–23)
Chloride: 103 mEq/L (ref 96–112)
Creatinine, Ser: 0.9 mg/dL (ref 0.4–1.5)
HCT: 42 % (ref 39.0–52.0)
Hemoglobin: 14.3 g/dL (ref 13.0–17.0)
Sodium: 139 mEq/L (ref 135–145)

## 2010-08-14 LAB — BASIC METABOLIC PANEL
BUN: 13 mg/dL (ref 6–23)
BUN: 16 mg/dL (ref 6–23)
BUN: 9 mg/dL (ref 6–23)
CO2: 24 mEq/L (ref 19–32)
Calcium: 8.1 mg/dL — ABNORMAL LOW (ref 8.4–10.5)
Calcium: 8.4 mg/dL (ref 8.4–10.5)
Calcium: 8.5 mg/dL (ref 8.4–10.5)
Calcium: 8.7 mg/dL (ref 8.4–10.5)
Chloride: 104 mEq/L (ref 96–112)
Creatinine, Ser: 0.88 mg/dL (ref 0.4–1.5)
Creatinine, Ser: 0.91 mg/dL (ref 0.4–1.5)
GFR calc Af Amer: >60 mL/min (ref >60)
GFR calc Af Amer: >60 mL/min (ref >60)
GFR calc non Af Amer: >60 mL/min (ref >60)
GFR calc non Af Amer: >60 mL/min (ref >60)
GFR calc non Af Amer: >60 mL/min (ref >60)
GFR calc non Af Amer: >60 mL/min (ref >60)
Glucose, Bld: 95 mg/dL (ref 70–99)
Potassium: 3.4 mEq/L — ABNORMAL LOW (ref 3.5–5.1)
Potassium: 3.6 mEq/L (ref 3.5–5.1)
Potassium: 3.7 mEq/L (ref 3.5–5.1)
Potassium: 4 mEq/L (ref 3.5–5.1)
Sodium: 140 mEq/L (ref 135–145)

## 2010-08-14 LAB — DIFFERENTIAL
Basophils Absolute: 0.1 10*3/uL (ref 0.0–0.1)
Basophils Relative: 1 % (ref 0–1)
Eosinophils Absolute: 0.6 10*3/uL (ref 0.0–0.7)
Eosinophils Absolute: 1.2 10*3/uL — ABNORMAL HIGH (ref 0.0–0.7)
Eosinophils Relative: 6 % — ABNORMAL HIGH (ref 0–5)
Lymphs Abs: 1.7 10*3/uL (ref 0.7–4.0)
Lymphs Abs: 1.8 10*3/uL (ref 0.7–4.0)
Monocytes Absolute: 0.8 10*3/uL (ref 0.1–1.0)
Monocytes Absolute: 1.4 10*3/uL — ABNORMAL HIGH (ref 0.1–1.0)
Monocytes Relative: 11 % (ref 3–12)
Neutro Abs: 5.3 10*3/uL (ref 1.7–7.7)
Neutro Abs: 8.2 10*3/uL — ABNORMAL HIGH (ref 1.7–7.7)
Neutrophils Relative %: 65 % (ref 43–77)

## 2010-08-14 LAB — CBC
HCT: 35.7 % — ABNORMAL LOW (ref 39.0–52.0)
HCT: 37.4 % — ABNORMAL LOW (ref 39.0–52.0)
Hemoglobin: 12.8 g/dL — ABNORMAL LOW (ref 13.0–17.0)
Hemoglobin: 13 g/dL (ref 13.0–17.0)
Hemoglobin: 14.1 g/dL (ref 13.0–17.0)
MCH: 29.6 pg (ref 26.0–34.0)
MCHC: 34.2 g/dL (ref 30.0–36.0)
MCHC: 34.4 g/dL (ref 30.0–36.0)
MCHC: 34.8 g/dL (ref 30.0–36.0)
MCV: 86.2 fL (ref 78.0–100.0)
Platelets: 375 10*3/uL (ref 150–400)
Platelets: 390 10*3/uL (ref 150–400)
RDW: 13.2 % (ref 11.5–15.5)
RDW: 13.4 % (ref 11.5–15.5)
RDW: 13.6 % (ref 11.5–15.5)
RDW: 13.7 % (ref 11.5–15.5)
WBC: 10.5 10*3/uL (ref 4.0–10.5)
WBC: 9.9 10*3/uL (ref 4.0–10.5)

## 2010-08-14 LAB — ANAEROBIC CULTURE: Gram Stain: NONE SEEN

## 2010-08-14 LAB — PROTIME-INR: Prothrombin Time: 13.6 seconds (ref 11.6–15.2)

## 2010-08-14 LAB — VANCOMYCIN, TROUGH: Vancomycin Tr: 20.2 ug/mL — ABNORMAL HIGH (ref 10.0–20.0)

## 2010-08-14 LAB — CULTURE, BLOOD (ROUTINE X 2)

## 2010-08-14 LAB — C-REACTIVE PROTEIN: CRP: 2.4 mg/dL — ABNORMAL HIGH (ref <0.6)

## 2010-08-14 LAB — LIPID PANEL
HDL: 32 mg/dL — ABNORMAL LOW (ref >39)
Total CHOL/HDL Ratio: 5.3 RATIO
VLDL: 46 mg/dL — ABNORMAL HIGH (ref 0–40)

## 2010-08-14 LAB — WOUND CULTURE

## 2010-08-14 LAB — HEMOGLOBIN A1C: Mean Plasma Glucose: 120 mg/dL — ABNORMAL HIGH (ref <117)

## 2010-08-14 LAB — ANA: Anti Nuclear Antibody(ANA): NEGATIVE

## 2010-08-17 ENCOUNTER — Telehealth: Payer: Self-pay | Admitting: *Deleted

## 2010-08-17 NOTE — Telephone Encounter (Signed)
Pt called and was seen a ENT at  [facility] (Dr Donnie Mesa)  for hearing problem, today.  He was told he was having this problem because him eustachian tube is remaining open.  Dr Donnie Mesa told pt he needed  to avoid any diuretic medications.  Can you change his BP meds to something without HCTZ.  Pharmacy: [facility] on [address]. Pt # [phone]

## 2010-08-18 NOTE — Telephone Encounter (Signed)
Can we please call Dr. Donnie Mesa at [facility] and find out about this request?  I have records from visit on 07/18/10 and it does not mention any need to stop diuretic therapy.  Thanks,

## 2010-08-19 ENCOUNTER — Encounter: Payer: Self-pay | Admitting: Internal Medicine

## 2010-08-19 ENCOUNTER — Telehealth: Payer: Self-pay | Admitting: *Deleted

## 2010-08-19 MED ORDER — LISINOPRIL 20 MG PO TABS: 20.0000 mg | ORAL_TABLET | Freq: Every day | ORAL | Status: DC

## 2010-08-19 NOTE — Telephone Encounter (Signed)
[name] got records. THe statement in the note from ENT was "possible treamtment modalities including avoiding his diuretic medication."  Since he is overly concerned, I will separate his lisinopril so he can stop the HCTZ. HE has appt with Onalee Hua so that he can F/U and see what his BP is. I sent Rx to pharmacy.  Checked labs and had FLP and chemistries about 9 months ago so those are up to date.

## 2010-08-19 NOTE — Telephone Encounter (Signed)
Call to pt informed him that a prescription for the Lisinopril has been sent to the [facility] and to stop the HCTZ/Lisinopril combination pill.  Dr. Onalee Hua to see him in April for follow up per Dr. Rogelia Boga.

## 2010-08-19 NOTE — Telephone Encounter (Signed)
Call from pt asking about stopping his HCTZ.  Pt said that when he saw Dr. Donnie Mesa a few days ago was told that he needed to stop taking the HCTZ because of his problem with his ears.  Pt said that he ears.  Pt also said that he has not seen his PCP in awhile and did not know when he needed to come back in for an appointment.  Pt also said that he needs lab work as well.  Pt was sent to the scheduler to make an appointment.  Pt has an appointment scheduled for 08/30/2010.  Call to Dr. Roselyn Meier office to get the last office visit.  Last office visit was 3/21/ 2012.  Office note to be faxed today.  Will have the doctor to  review.

## 2010-08-22 NOTE — Telephone Encounter (Signed)
This info was rec'd, dr Midwife reviewed

## 2010-08-24 ENCOUNTER — Ambulatory Visit (INDEPENDENT_AMBULATORY_CARE_PROVIDER_SITE_OTHER): Payer: Self-pay | Admitting: Internal Medicine

## 2010-08-24 ENCOUNTER — Encounter: Payer: Self-pay | Admitting: Internal Medicine

## 2010-08-24 VITALS — BP 143/88 | HR 90 | Temp 99.4°F | Ht 68.0 in | Wt 248.7 lb

## 2010-08-24 DIAGNOSIS — E785 Hyperlipidemia, unspecified: Secondary | ICD-10-CM

## 2010-08-24 DIAGNOSIS — I10 Essential (primary) hypertension: Secondary | ICD-10-CM

## 2010-08-24 DIAGNOSIS — H9190 Unspecified hearing loss, unspecified ear: Secondary | ICD-10-CM

## 2010-08-24 DIAGNOSIS — J069 Acute upper respiratory infection, unspecified: Secondary | ICD-10-CM | POA: Insufficient documentation

## 2010-08-24 LAB — CBC WITH DIFFERENTIAL/PLATELET
Basophils Relative: 1 % (ref 0–1)
Eosinophils Absolute: 0.7 10*3/uL (ref 0.0–0.7)
Lymphs Abs: 1.1 10*3/uL (ref 0.7–4.0)
MCH: 28.6 pg (ref 26.0–34.0)
MCHC: 32.6 g/dL (ref 30.0–36.0)
Neutrophils Relative %: 70 % (ref 43–77)
Platelets: 402 10*3/uL — ABNORMAL HIGH (ref 150–400)
RBC: 4.76 MIL/uL (ref 4.22–5.81)

## 2010-08-24 LAB — COMPREHENSIVE METABOLIC PANEL
Albumin: 4 g/dL (ref 3.5–5.2)
Alkaline Phosphatase: 78 U/L (ref 39–117)
CO2: 24 mEq/L (ref 19–32)
Chloride: 106 mEq/L (ref 96–112)
Glucose, Bld: 109 mg/dL — ABNORMAL HIGH (ref 70–99)
Potassium: 4.6 mEq/L (ref 3.5–5.3)
Sodium: 139 mEq/L (ref 135–145)
Total Protein: 7.2 g/dL (ref 6.0–8.3)

## 2010-08-24 LAB — LIPID PANEL: Total CHOL/HDL Ratio: 5.5 Ratio

## 2010-08-24 MED ORDER — GUAIFENESIN-CODEINE 100-10 MG/5ML PO SYRP: 5.0000 mL | ORAL_SOLUTION | Freq: Three times a day (TID) | ORAL | Status: AC | PRN

## 2010-08-24 MED ORDER — LISINOPRIL 20 MG PO TABS: 40.0000 mg | ORAL_TABLET | Freq: Every day | ORAL | Status: DC

## 2010-08-24 MED ORDER — ALBUTEROL SULFATE HFA 108 (90 BASE) MCG/ACT IN AERS: 2.0000 | INHALATION_SPRAY | Freq: Four times a day (QID) | RESPIRATORY_TRACT | Status: DC | PRN

## 2010-08-24 NOTE — Progress Notes (Signed)
  Subjective:    Patient ID: [name], male    DOB: [birth year], 55 y.o.   MRN: [id number]  HPI Started coughing 5 days ago, productive of light green sputum but now dry.  Sore throat/chest from coughing.  Difficulty sleeping due to cough.  No fevers.  Nasal congestion and sneezing.  No abdominal or systemic symptoms.      Review of Systems  Constitutional: Negative for fever, activity change and appetite change.  HENT: Positive for congestion, rhinorrhea and sneezing. Negative for ear pain and nosebleeds.   Eyes: Negative for visual disturbance.  Respiratory: Positive for cough and wheezing. Negative for chest tightness and shortness of breath.   Cardiovascular: Negative for chest pain and palpitations.  Musculoskeletal: Negative for myalgias.       Objective:   Physical Exam  Constitutional: He is oriented to person, place, and time. He appears well-developed and well-nourished. No distress.  HENT:  Head: Normocephalic and atraumatic.  Right Ear: External ear normal. No drainage, swelling or tenderness. Tympanic membrane is not injected and not erythematous.  Left Ear: External ear normal. No drainage, swelling or tenderness. Tympanic membrane is not injected and not erythematous.  Nose: Mucosal edema and rhinorrhea present. No nose lacerations or sinus tenderness. No epistaxis. Right sinus exhibits no maxillary sinus tenderness and no frontal sinus tenderness. Left sinus exhibits no maxillary sinus tenderness and no frontal sinus tenderness.  Mouth/Throat: Oropharynx is clear and moist. No oropharyngeal exudate.  Cardiovascular: Normal rate, regular rhythm and normal heart sounds.  Exam reveals no gallop and no friction rub.   No murmur heard. Pulmonary/Chest: Effort normal. No respiratory distress. He has wheezes (mild with forced expiration). He has no rales.       Dry cough during exam  Abdominal: Bowel sounds are normal. There is no tenderness.  Neurological: He is alert and  oriented to person, place, and time.  Skin: Skin is warm and dry. No rash noted. He is not diaphoretic.  Psychiatric: He has a normal mood and affect.          Assessment & Plan:

## 2010-08-24 NOTE — Patient Instructions (Signed)
Follow up in 3 months. Take medications as prescribed above. Get zyrtec or claritin over the counter to help with congestion. Start cholesterol medicine again. Increase lisinopril to 2 tablets daily. Will check labs today and call you if there are any problems.

## 2010-08-25 ENCOUNTER — Other Ambulatory Visit: Payer: Self-pay | Admitting: *Deleted

## 2010-08-26 ENCOUNTER — Encounter: Payer: Self-pay | Admitting: Internal Medicine

## 2010-08-26 MED ORDER — CARVEDILOL 12.5 MG PO TABS: 12.5000 mg | ORAL_TABLET | Freq: Two times a day (BID) | ORAL | Status: DC

## 2010-08-26 NOTE — Assessment & Plan Note (Signed)
Previously at goal. Stopped taking statin because he thought labs needed to be checked and thought they had not.   Instructed to restart.

## 2010-08-26 NOTE — Assessment & Plan Note (Signed)
URI, likely viral Mild wheezing on exam but no symptoms or exam findings to suggest pneumonia Symptomatic tx with albuterol (1 mo supply only and no refills required for now), OTC anti-histamine and cheratussin prn.

## 2010-08-26 NOTE — Assessment & Plan Note (Signed)
Slightly above goal today, likely due to URI and ongoing cough. Will not make any changes, checking metabolic panel.

## 2010-08-30 ENCOUNTER — Ambulatory Visit: Payer: Self-pay | Admitting: Internal Medicine

## 2010-09-07 ENCOUNTER — Encounter: Payer: Self-pay | Admitting: Internal Medicine

## 2010-09-07 DIAGNOSIS — F329 Major depressive disorder, single episode, unspecified: Secondary | ICD-10-CM | POA: Insufficient documentation

## 2010-09-28 ENCOUNTER — Other Ambulatory Visit: Payer: Self-pay | Admitting: *Deleted

## 2010-09-28 MED ORDER — HYDROCODONE-ACETAMINOPHEN 5-500 MG PO CAPS: 1.0000 | ORAL_CAPSULE | ORAL | Status: DC | PRN

## 2010-09-29 NOTE — Telephone Encounter (Signed)
Prescription for Lorcet HD called to [facility].

## 2010-10-13 ENCOUNTER — Other Ambulatory Visit: Payer: Self-pay | Admitting: Internal Medicine

## 2010-10-13 DIAGNOSIS — I10 Essential (primary) hypertension: Secondary | ICD-10-CM

## 2010-10-13 MED ORDER — CARVEDILOL 12.5 MG PO TABS: 12.5000 mg | ORAL_TABLET | Freq: Two times a day (BID) | ORAL | Status: DC

## 2010-10-13 NOTE — Assessment & Plan Note (Signed)
Pt is taking coreg 12.5mg  po bid, adding to med list.

## 2010-10-14 NOTE — Discharge Summary (Signed)
[name], [name]                 ACCOUNT NO.:  [id number]   MEDICAL RECORD NO.:  [id number]          PATIENT TYPE:  INP   LOCATION:  3740                         FACILITY:  [facility]   PHYSICIAN:  [name], M.D.DATE OF BIRTH:  [birth year]   DATE OF ADMISSION:  12/29/2003  DATE OF DISCHARGE:  12/31/2003                                 DISCHARGE SUMMARY   DISCHARGE DIAGNOSES:  1.  Chest pain of noncardiac etiology.  2.  Gastroesophageal reflux disease.  3.  Hypertension.  4.  Hyperlipidemia.  5.  Anxiety/depression.   DISCHARGE MEDICATIONS:  1.  Lisinopril 20 mg p.o. daily.  2.  Hydrochlorothiazide 25 mg p.o. daily.  3.  Zocor 40 mg p.o. daily.  4.  Famotidine 40 mg p.o. q.h.s.  5.  Aspirin 325 mg p.o. daily.  6.  Lexapro 10 mg p.o. daily.   PROCEDURES:  1.  On December 31, 2003 stress Cardiolite test was performed by Dr. Algie Hoover      and this was negative for any areas of ischemia or myocardial injury.      The patient's left ventricular ejection fraction was calculated and      estimated to be around 57%.  2.  Chest x-ray that was performed on December 29, 2003 that was a normal x-ray      without any evidence of acute cardiopulmonary process.   CONSULTS:  Cardiologist, Dr. Rinaldo Hoover   HISTORY:  This is a 55 year old Caucasian man with past medical history  significant for hypertension, hyperlipidemia, and anxiety.  The patient also  had a strong family history of coronary artery disease with recent death of  his sister from the same.  The patient presented to the emergency room with  one day history of chest tightness and shortness of breath that had  persisted from the morning of presentation.  The patient noticed that the  pain started when he started moving around from waking up and was escalating  in nature and radiating towards his left arm and left jaw.  The patient  denied any associated nausea, vomiting, or diaphoresis.  Patient denied any  dizziness or loss  of consciousness.  The patient denied any aggravating or  relieving factors of this chest pain.  The patient noted that the pain was  partly relieved by ingesting a nitroglycerin tablet given to him by the EMS.  The patient denied any palpitations and has no past history of angina.  Three days prior to presentation patient noted a burning kind of sensation  in his throat and middle part of his chest that was alleviated by Tums.   ALLERGIES:  The patient has no known drug allergies.   PAST MEDICAL HISTORY:  In addition to the conditions described above, this  is also significant for hyperlipidemia, diverticulitis, history of  nephrolithiasis, and history of anxiety and depression (previously on  Lexapro).   SOCIAL HISTORY:  The patient denies any tobacco abuse and says he has an  occasional beer now and then.  The patient is divorced.  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  He is self-pay for  both his health care and prescriptions.  He lives by himself at home.   FAMILY HISTORY:  This is significant for cerebrovascular accident in his  mother who died at the age of 64.  His father has emphysema and had his  first episode of coronary artery disease and myocardial infarction at the  age of 85.  One of his sisters had diabetes and suffered several myocardial  infarctions and died of the same a few months back.  He has two children in  good health.   REVIEW OF SYSTEMS:  Significant for depression and anxiety.   PHYSICAL EXAMINATION:  VITAL SIGNS:  Temperature 98.5, respiratory rate 20,  pulse 65, blood pressure 140/92.  GENERAL:  The patient appeared anxious.  HEENT:  Eyes:  The left pupil appear dilated when light was shown, but was  reactive to light.  Extraocular movements are intact were normal.  ENT:  This was unremarkably normal.  NECK:  Supple.  There was no goiter or jugular venous distention.  RESPIRATORY:  Clear to auscultation  bilaterally.  CARDIOVASCULAR:  Pulse was regular in rate and rhythm.  Heart sounds S1 and  S2 are normal without any murmurs, rubs, or gallops.  ABDOMEN:  Soft, nontender, nondistended.  Bowel sounds are normal.  EXTREMITIES:  The patient did not have any pedal edema and had bilateral  peripheral pulses symmetrically bilaterally.  NEUROLOGIC:  This was unremarkably normal.   LABORATORIES:  Sodium 140, potassium 4.2, chloride 107, bicarbonate 26, BUN  13, creatinine 1.1, glucose 94.  Hemoglobin is at 14.2, white cell count 8.5  with absolute neutrophils of 5.7, platelet count of 403, MCV 82.9.  Serum  bilirubin was at 0.7, alkaline phosphatase 76, SGOT 28, SGPT 38, protein  6.7, albumin 3.5.  First set of cardiac enzymes that was drawn on this  patient was normal.   HOSPITAL COURSE:  PROBLEM 1 - CHEST PAIN/CHEST TIGHTNESS:  Several  differential diagnoses were entertained and with a strong history of  myocardial infarction as well as coronary artery disease, we went ahead and  screened this patient for acute myocardial injury.  A battery of cardiac  enzymes was obtained and three sets done over the span of 24 hours revealed  negative cardiac markers for myocardial injury.  His EKG was also serially  done and did not reveal any ST segment changes that would be consistent with  acute myocardial injury.  In search for probable risk factors, a urinary  drug screen was obtained and this was negative for substances of abuse.  Serum fasting lipid panel was drawn that showed total cholesterol of 201,  HDL of 37, triglycerides of 162, and an LDL of 132.  The patient as such was  started on Zocor to alleviate this risk factor.  A cardiology consultation  was sought on the next day of admission and they evaluated this patient and  due to his strong family history set him up for a stress Cardiolite test that was negative for any myocardial injury.  As an outpatient this patient  will be followed up  by Dr. Sharyn Hoover for his cardiac related issues and he  will also be followed up at the [facility] for his  other medical issues that would require prompt attention as well as for  periodic health assessments.   PROBLEM 2 - HYPERTENSION:  The patient had been noncompliance with his  medications due to lack of affordability and as such samples were obtained  for the ACE inhibitor, lisinopril, as well as a diuretic,  hydrochlorothiazide.  The patient will be kept on dosages titrated as he  responds to these medications.   PROBLEM 3 - ANXIETY/DEPRESSION:  The patient had previously had good  response to Lexapro, but had not been able to take this medicine for a while  due to affordability issues.  He will be started on Lexapro and a starter  pack was given to this patient in order to reestablish his antidepressant  medications.  As an outpatient the patient will also be set up with a  medication assistance program in the [facility] in order  to improve his accessibility to his medications.   PROBLEM 4 - GASTROESOPHAGEAL REFLUX DISEASE:  A proton-pump inhibitor would  probably have been more preferable for the treatment of this patient's  gastroesophageal reflux disease but due to social economic factors at this  time we will attempt to treat him with histamine-2 receptor antagonist,  namely famotidine, and see his response to this and probably initiate  treatment with a PPI should it be necessary.       JP/MEDQ  D:  03/08/2004  T:  03/08/2004  Job:  [id number]   cc:   Eduardo Osier. [name], M.D.  [address]  [place]  [place] [address]  Fax: [phone]   Ricki Rodriguez, M.D.  [address]  [place] [address]  Fax: [phone]

## 2010-10-14 NOTE — Discharge Summary (Signed)
NAMEARSLAN, [name] NO.:  [id number]   MEDICAL RECORD NO.:  [id number]          PATIENT TYPE:  OBV   LOCATION:  4740                         FACILITY:  [facility]   PHYSICIAN:  Edsel Petrin, D.O.DATE OF BIRTH:  02/12/1956   DATE OF ADMISSION:  08/23/2006  DATE OF DISCHARGE:  08/24/2006                               DISCHARGE SUMMARY   DISCHARGE DIAGNOSES:  1. Atypical chest pain.  2. Hypertension.  3. Hyperlipidemia.  4. Gastroesophageal reflux disease.  5. Depression and anxiety.  6. History of left anterior cruciate ligament (ACL) tear.   DISCHARGE MEDICATIONS:  1. Lisinopril/hydrochlorothiazide 20/12.5, take one tablet p.o. daily.  2. Pravachol 40 mg p.o. daily.  3. Aspirin 81 mg p.o. daily.  4. Omeprazole 20 mg p.o. daily.   DISPOSITION AND FOLLOWUP:  The patient was discharged from the hospital  in stable and improved condition.  He has a followup appointment in the  outpatient clinic with Dr. Phillips Odor on August 06, 2006, at 1:30 p.m.  At  the time of followup, he will need to be scheduled for an outpatient  cardiac stress test.   BRIEF ADMITTING HISTORY AND PHYSICAL:  The patient is a 55 year old man  with a past medical history significant for GERD and hypertension, as  well as hyperlipidemia, who presented to the [facility] Emergency  Department with chest pain.  The pain was substernal, tight, and was 7  out of 10 in intensity.  It did not radiate.  The pain was worse with  lying down.  This episode of chest pain was also associated with  shortness of breath and some nausea/vomiting times one episode.  After  he had vomited, he did have a period of heartburn with gastroesophageal  reflux symptoms.  He did receive one sublingual nitroglycerin, which  relieved the pain.  He denied any abdominal pain, fever, chills, or  other complaints.   ADMISSION VITAL SIGNS:  Temperature 98.2, blood pressure 149/98, pulse  93, respiratory rate 20, O2  saturation 96% on room air.   LABORATORY DATA ON ADMISSION:  Cardiac enzymes:  Troponin 0.05.  Sodium  138, potassium 3.6, chloride 106, bicarb 26, BUN 16, creatinine 1.2,  glucose 98.  Chest x-ray showed questionable lingular infiltrate.  CBC:  WBCs 11.1, hematocrit 15, platelets 423.   HOSPITAL COURSE BY PROBLEM:  PROBLEM #1:  Chest pain.  The patient was  admitted to rule out acute coronary syndrome, as well as other causes of  life-threatening chest pain.  Cardiac enzymes were cycled; they were  negative.  There were no new changes on EKG.  He was monitored on  telemetry, with no arrhythmias.  A 2-D echo was obtained; results are  not available at the time of discharge.  Chest x-ray was obtained and  showed no abnormalities.  Cardiology was called and assessed the  patient.  Assessment of the patient was made by Dr. Jacinto Halim.  The patient  will be set up for an outpatient Myoview scan.  His last Myoview scan  was in 2005 and showed no ischemia.   PROBLEM #2:  Hypertension.  The patient was continued on his home  medications.  His blood pressure was stable throughout his  hospitalization.   PROBLEM #3:  Hyperlipidemia.  The patient was continued on a statin.  No  changes were made to his home medication regimen.   PROBLEM #4:  Gastroesophageal reflux disease.  The patient did  experience several episodes of reflux during his hospital stay.  He was  placed on Protonix during his hospitalization, with some improvement in  his symptoms, and was discharged with prescription for omeprazole.  There may likely be a gastrointestinal component to his chest pain  complaints.   DISCHARGE VITAL SIGNS:  Blood pressure 116/83, pulse 87, respiratory  rate 20, temperature 98.4.  O2 saturations were 95% on room air.   DISCHARGE LABORATORY DATA:  WBCs 9.7, hemoglobin of 14.4, hematocrit 41,  platelets 371.  Fasting lipid panel:  Cholesterol 246, triglycerides  173, HDL 31, LDL 180.       Edsel Petrin, D.O.  Electronically Signed     ELG/MEDQ  D:  10/27/2006  T:  10/27/2006  Job:  [id number]

## 2010-11-16 ENCOUNTER — Other Ambulatory Visit: Payer: Self-pay | Admitting: Internal Medicine

## 2010-11-18 ENCOUNTER — Telehealth: Payer: Self-pay | Admitting: *Deleted

## 2010-11-18 NOTE — Telephone Encounter (Addendum)
Call from pt said that he has been exposed to [facility].  Has a rash all over his arms.   Is also between his fingers and has blisters.  It is getting worse and not better.  He is in pain.  Place under bottom lip as well.   Wants to know what he can do.  Has tried [employer].  Wants something called to the [facility] on [address].  Afraid it will get in his bloodstream.

## 2010-11-18 NOTE — Telephone Encounter (Signed)
Sounds like patient should be evaluated in clinic if opening available or [facility]/ED.

## 2010-11-18 NOTE — Telephone Encounter (Signed)
RTC to pt message left on pt's voicemail taht he will

## 2010-11-18 NOTE — Telephone Encounter (Signed)
No available appointments in clinic.

## 2011-02-28 ENCOUNTER — Other Ambulatory Visit: Payer: Self-pay | Admitting: Internal Medicine

## 2011-03-01 ENCOUNTER — Telehealth: Payer: Self-pay | Admitting: *Deleted

## 2011-03-01 LAB — DIFFERENTIAL
Basophils Absolute: 0.1
Basophils Relative: 0
Eosinophils Absolute: 0.2
Eosinophils Relative: 1
Monocytes Absolute: 1.3 — ABNORMAL HIGH

## 2011-03-01 LAB — URINALYSIS, ROUTINE W REFLEX MICROSCOPIC
Glucose, UA: NEGATIVE
Leukocytes, UA: NEGATIVE
Protein, ur: NEGATIVE
Specific Gravity, Urine: 1.022
pH: 6

## 2011-03-01 LAB — CBC
MCV: 84.4
Platelets: 410 — ABNORMAL HIGH
RBC: 5.07
WBC: 18.1 — ABNORMAL HIGH

## 2011-03-01 LAB — URINE MICROSCOPIC-ADD ON

## 2011-03-01 LAB — COMPREHENSIVE METABOLIC PANEL
ALT: 21
AST: 17
Albumin: 3.7
Alkaline Phosphatase: 87
CO2: 27
Chloride: 102
Creatinine, Ser: 1.11
GFR calc Af Amer: >60
GFR calc non Af Amer: >60
Potassium: 3.9
Total Bilirubin: 1.5 — ABNORMAL HIGH

## 2011-03-01 LAB — URINE CULTURE: Colony Count: 5000

## 2011-03-01 LAB — LIPASE, BLOOD: Lipase: 24

## 2011-03-01 NOTE — Telephone Encounter (Signed)
Request refill: Hydrocodone/APAP 5/500mg  - take 1 tab every 4 hrs as needed for pain

## 2011-03-01 NOTE — Telephone Encounter (Signed)
I have not seen the patient before and his problem list does not indicate a need for narcotic pain medication. Also reviewing his last note from appointment with Dr. Onalee Hua, Vicodin was discontinued due to error. I would not feel comfortable prescribing this to him. If he needs a refill he is to make an appointment to see a doctor in our clinic.  Thanks. Marvis Saefong.

## 2011-03-02 NOTE — Telephone Encounter (Signed)
[name].

## 2011-03-02 NOTE — Telephone Encounter (Signed)
Pt was called; had many questions as to why he needs an appt; stated he does not take Vicodin very often ; stated hx AC tear of right knee. Appt has been scheduled 03/08/11.

## 2011-03-08 ENCOUNTER — Encounter: Payer: Self-pay | Admitting: Internal Medicine

## 2011-03-08 ENCOUNTER — Ambulatory Visit (INDEPENDENT_AMBULATORY_CARE_PROVIDER_SITE_OTHER): Payer: Self-pay | Admitting: Internal Medicine

## 2011-03-08 VITALS — BP 141/83 | HR 75 | Temp 98.2°F | Ht 68.0 in | Wt 227.5 lb

## 2011-03-08 DIAGNOSIS — Z8739 Personal history of other diseases of the musculoskeletal system and connective tissue: Secondary | ICD-10-CM

## 2011-03-08 DIAGNOSIS — I10 Essential (primary) hypertension: Secondary | ICD-10-CM

## 2011-03-08 DIAGNOSIS — E785 Hyperlipidemia, unspecified: Secondary | ICD-10-CM

## 2011-03-08 DIAGNOSIS — Z87828 Personal history of other (healed) physical injury and trauma: Secondary | ICD-10-CM

## 2011-03-08 MED ORDER — HYDROCODONE-ACETAMINOPHEN 5-500 MG PO TABS: 1.0000 | ORAL_TABLET | Freq: Every day | ORAL | Status: AC | PRN

## 2011-03-08 MED ORDER — HYDROCODONE-ACETAMINOPHEN 5-500 MG PO TABS: 1.0000 | ORAL_TABLET | Freq: Four times a day (QID) | ORAL | Status: DC | PRN

## 2011-03-08 NOTE — Assessment & Plan Note (Signed)
Lab Results  Component Value Date   NA 139 08/24/2010   K 4.6 08/24/2010   CL 106 08/24/2010   CO2 24 08/24/2010   BUN 13 08/24/2010   CREATININE 0.83 08/24/2010   CREATININE 0.89 11/23/2009    BP Readings from Last 3 Encounters:  03/08/11 141/83  08/24/10 143/88  06/23/10 109/74    Assessment: Hypertension control:  mildly elevated  Progress toward goals:  unchanged Barriers to meeting goals:  no barriers identified  Plan: Hypertension treatment:  continue current medications Systolic blood pressure just above 140.

## 2011-03-08 NOTE — Patient Instructions (Signed)
Please Make followup appointment in 5 to 6 months. Meanwhile if you have any issues make an early appointment. Also call the clinic once we have your insurance and come by to do the urine test. For now I will give you one prescription for Vicodin. Take all your blood pressure medication regularly.

## 2011-03-08 NOTE — Assessment & Plan Note (Addendum)
Not taking statin as he thinks it makes his skin age quickly and make muscles week. Discussed with him that it is more important for him to be on statins than worry about the rare side effects. He did not want to start statin today. Wants to control with diet and exercise. He would like to wait till next lipid panel- which is due in March 2013. Last HDL was 140. His goal is less than 130.

## 2011-03-08 NOTE — Progress Notes (Signed)
  Subjective:    Patient ID: [name], male    DOB: [birth year], 55 y.o.   MRN: [id number]  HPI [name] is a pleasant 55 year old man with past with history of left knee anterior cruciate ligament tear along with left shoulder injury in his 55s, hypertension, hyper lipidemia comes the clinic for followup in pain medicine refill. He was seen by Dr. Onalee Hua in March 2012. This is his last visit with me. He called for Vicodin refill for his left knee pain, but I did not find Vicodin in his medication list and so called him in to get checked to start Vicodin. He had an injury while he was complaining for martial arts contest in his 55s in [place]. At that time he had left anterior cruciate ligament tear along with left shoulder injury. He had arthroscopic surgeries in past. He needs Vicodin once in a while and 30 tablets last about 2-3 months. He complains of increased left knee pain recently because he's been more active now. The pain is 5/10, deep achy, nonradiating. The pain gets worse with full flexion. He has some left shoulder pain which is mild and nonradiating. It associated with some neck muscle pain on the left side. He denies any fever, chills, nausea vomiting, abdominal pain, diarrhea, headache. He has 21 pound intentional weight loss in last 6 months.   Review of Systems   Objective:   Physical Exam Vitals: Reviewed General: NAD HEENT: PERRL, EOMI, no scleral icterus Cardiac: RRR, no rubs, murmurs or gallops Pulm: clear to auscultation bilaterally, moving normal volumes of air Abd: soft, nontender, nondistended, BS present Ext: warm and well perfused, no pedal edema, mild decreased  ROM of left knee due to pain. No tenderness to palpation, swelling, redness. Normal right knee. Neuro: alert and oriented X3, cranial nerves II-XII grossly intact, strength and sensation to light touch equal in bilateral upper and lower extremities        Assessment & Plan:

## 2011-03-08 NOTE — Assessment & Plan Note (Addendum)
Reviewing the MRI in past of his knee joint, he has anterior cruciate ligament tear and some other chronic changes. He was prescribed Vicodin by Dr. Onalee Hua in past, which he needs about 30 tablets every 2 to 3 months. Has had no red flag signs in past. He is supposed to start new job in [place] next week and will get insurance after that. I will give him prescription of 30 tablets of Vicodin without refills but could not do a urine drug screen today to avoid extra cost. Once he gets insurance, he will call the clinic and come by for urine drug screen. He would likely not need an appointment with Dr. at that time. I discussed about the pain contract with him and he understood. Considering no red flag signs and minimal need of pain medications, I will consider calling him just for UDS without doctor's appointment. After that, we'll consider giving him chronic pain medications as needed.

## 2011-07-07 ENCOUNTER — Other Ambulatory Visit: Payer: Self-pay | Admitting: *Deleted

## 2011-07-10 NOTE — Telephone Encounter (Addendum)
Received request for Hydrocodone/Acet. 5-500 from [facility] on 07/07/2011 - left message for pt to call refill nurse to notify [facility]  which pharmacy he is using.  [facility] and last refill from them was 5/12, pt has switched to [facility].

## 2011-07-11 NOTE — Telephone Encounter (Signed)
Last refill: 03/24/11

## 2011-07-12 ENCOUNTER — Telehealth: Payer: Self-pay | Admitting: *Deleted

## 2011-07-12 MED ORDER — HYDROCODONE-ACETAMINOPHEN 5-500 MG PO CAPS: 1.0000 | ORAL_CAPSULE | ORAL | Status: AC | PRN

## 2011-07-12 NOTE — Telephone Encounter (Signed)
Lorcet-HD 5/500mg  rx faxed to [facility]

## 2011-09-25 ENCOUNTER — Telehealth: Payer: Self-pay | Admitting: *Deleted

## 2011-09-25 DIAGNOSIS — Z87828 Personal history of other (healed) physical injury and trauma: Secondary | ICD-10-CM

## 2011-09-25 NOTE — Telephone Encounter (Signed)
Fax from [facility] - refiill request for Hydrocodone-Acet 5/500mg  - Take 1 tab by mouth every 4hrs as needed for pain Last filled 07/12/11  Qty #30.  Thanks

## 2011-09-26 ENCOUNTER — Other Ambulatory Visit: Payer: Self-pay

## 2011-09-26 NOTE — Telephone Encounter (Signed)
Lab appt scheduled for today. 

## 2011-09-26 NOTE — Telephone Encounter (Signed)
I saw [name] in October 12. He had no red flag signs apparently for narcotics, but has no recent UDS. If you can call patient to come to lab and just get UDS done and then i will give prescription for vicodin, that's what we had discussed last visit. He doesn't need to see a doctor for this. I will sign pain contract later.  Thanks.

## 2011-09-27 NOTE — Telephone Encounter (Signed)
Pt stated he called yesterday and canceled lab appt and will try to come in today.

## 2011-09-29 NOTE — Telephone Encounter (Signed)
Pt has not came in for UDS at this time.

## 2011-11-29 NOTE — Addendum Note (Signed)
Addended by: Remus Blake on: 11/29/2011 03:27 PM   Modules accepted: Orders

## 2012-09-01 ENCOUNTER — Emergency Department (HOSPITAL_COMMUNITY)
Admission: EM | Admit: 2012-09-01 | Discharge: 2012-09-01 | Disposition: A | Discharge status: Home / Self Care | Payer: Self-pay | Attending: Emergency Medicine | Admitting: Emergency Medicine

## 2012-09-01 ENCOUNTER — Encounter (HOSPITAL_COMMUNITY): Payer: Self-pay | Admitting: *Deleted

## 2012-09-01 DIAGNOSIS — E785 Hyperlipidemia, unspecified: Secondary | ICD-10-CM | POA: Insufficient documentation

## 2012-09-01 DIAGNOSIS — H6691 Otitis media, unspecified, right ear: Secondary | ICD-10-CM

## 2012-09-01 DIAGNOSIS — Z87448 Personal history of other diseases of urinary system: Secondary | ICD-10-CM | POA: Insufficient documentation

## 2012-09-01 DIAGNOSIS — Z79899 Other long term (current) drug therapy: Secondary | ICD-10-CM | POA: Insufficient documentation

## 2012-09-01 DIAGNOSIS — H6690 Otitis media, unspecified, unspecified ear: Secondary | ICD-10-CM | POA: Insufficient documentation

## 2012-09-01 DIAGNOSIS — Z7982 Long term (current) use of aspirin: Secondary | ICD-10-CM | POA: Insufficient documentation

## 2012-09-01 DIAGNOSIS — Z8659 Personal history of other mental and behavioral disorders: Secondary | ICD-10-CM | POA: Insufficient documentation

## 2012-09-01 DIAGNOSIS — Z87828 Personal history of other (healed) physical injury and trauma: Secondary | ICD-10-CM | POA: Insufficient documentation

## 2012-09-01 DIAGNOSIS — I10 Essential (primary) hypertension: Secondary | ICD-10-CM | POA: Insufficient documentation

## 2012-09-01 MED ORDER — AMOXICILLIN 500 MG PO CAPS: 500.0000 mg | ORAL_CAPSULE | Freq: Three times a day (TID) | ORAL | Status: DC

## 2012-09-01 NOTE — ED Notes (Signed)
Right ear pain,   Woke him up tonight ,  Right ear

## 2012-09-01 NOTE — ED Notes (Signed)
Pt left AMA prior to receiving d/c paperwork

## 2012-09-01 NOTE — ED Provider Notes (Signed)
History     CSN: [id number]  Arrival date & time 09/01/12  0407   First MD Initiated Contact with Patient 09/01/12 [phone]      Chief Complaint  Patient presents with  . Otalgia    (Consider location/radiation/quality/duration/timing/severity/associated sxs/prior treatment) HPI Comments: Patient presents to the ED with a chief complaint of right ear pain.  He states that the pain started last night.  He states that he has a history of bad ears and frequent ear infections.  He states that he has been recovering from a "head cold." He states that the pain is 10/10.  He also endorses tinnitus.  He denies fever, chills, nausea, and vomiting.  He states that he has tried taking ibuprofen with some relief.  The history is provided by the patient. No language interpreter was used.    Past Medical History  Diagnosis Date  . Chest pain, atypical     12/2003:  negative cardiolyte  . HTN (hypertension)   . HLD (hyperlipidemia)   . Depression   . Hx of tear of ACL (anterior cruciate ligament)     right  . History of meniscal tear     bilateral  . Erectile dysfunction   . Insomnia   . Eczema   . Olecranon bursitis of left elbow 10/2009    s/p I&D by Dr Jillyn Hidden, initially assessed by Dr. Jennette Kettle     Past Surgical History  Procedure Laterality Date  . Skin tag removal      11 removed  . Knee arthroscopy    . Incise and drain abcess      L elbow due to cellulitis/bursitis    History reviewed. No pertinent family history.  History  Substance Use Topics  . Smoking status: Never Smoker   . Smokeless tobacco: Not on file  . Alcohol Use: No      Review of Systems  All other systems reviewed and are negative.    Allergies  Review of patient's allergies indicates no known allergies.  Home Medications   Current Outpatient Rx  Name  Route  Sig  Dispense  Refill  . aspirin 81 MG chewable tablet   Oral   Chew 81 mg by mouth daily.           Marland Kitchen ibuprofen (ADVIL,MOTRIN) 800 MG  tablet   Oral   Take 800 mg by mouth every 8 (eight) hours as needed for pain.         Marland Kitchen lisinopril (PRINIVIL,ZESTRIL) 20 MG tablet   Oral   Take 40 mg by mouth every morning.         . pravastatin (PRAVACHOL) 40 MG tablet   Oral   Take 40 mg by mouth at bedtime.           Marland Kitchen PRESCRIPTION MEDICATION   Oral   Take 1 tablet by mouth every morning. Blood pressure medication         . PRESCRIPTION MEDICATION   Oral   Take 1 tablet by mouth every morning. Blood pressure medication         . amoxicillin (AMOXIL) 500 MG capsule   Oral   Take 1 capsule (500 mg total) by mouth 3 (three) times daily.   21 capsule   0     BP 143/85  Pulse 64  Temp(Src) 97.7 F (36.5 C) (Oral)  Resp 20  SpO2 96%  Physical Exam  Nursing note and vitals reviewed. Constitutional: He is oriented to person, place, and  time. He appears well-developed and well-nourished.  HENT:  Head: Normocephalic and atraumatic.  Left Ear: External ear normal.  Nose: Nose normal.  Mouth/Throat: Oropharynx is clear and moist. No oropharyngeal exudate.  Right tympanic membrane with a significant amount of fluid/congestion behind it, no signs of perforation  Eyes: Conjunctivae and EOM are normal. Pupils are equal, round, and reactive to light. Right eye exhibits no discharge. Left eye exhibits no discharge. No scleral icterus.  Neck: Normal range of motion. Neck supple. No JVD present.  Cardiovascular: Normal rate, regular rhythm, normal heart sounds and intact distal pulses.  Exam reveals no gallop and no friction rub.   No murmur heard. Pulmonary/Chest: Effort normal and breath sounds normal. No respiratory distress. He has no wheezes. He has no rales. He exhibits no tenderness.  Abdominal: Soft. Bowel sounds are normal. He exhibits no distension and no mass. There is no tenderness. There is no rebound and no guarding.  Musculoskeletal: Normal range of motion. He exhibits no edema and no tenderness.   Neurological: He is alert and oriented to person, place, and time. He has normal reflexes.  CN 3-12 intact  Skin: Skin is warm and dry.  Psychiatric: He has a normal mood and affect. His behavior is normal. Judgment and thought content normal.    ED Course  Procedures (including critical care time)  Labs Reviewed - No data to display No results found.   1. Otitis media, right       MDM  Patient with right ear otitis media.  No mastoid tenderness.  No fevers, chills, nausea, or vomiting.   6:42 AM Returned to patient's room to review discharge instructions and patient was not present.  Nurse believes that he left.  After my exam, I told that patient that I would return shortly with a discharge plan and antibiotics.  Will have the nurse continue to look for the patient.  6:57 AM Nursing staff signed patient out AMA.  Patient did not complain during exam, or act strangely.  Uncertain as to why he left without his antibiotics.     Roxy Horseman, PA-C 09/01/12 [phone]

## 2012-09-01 NOTE — ED Notes (Signed)
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

## 2012-09-02 NOTE — ED Provider Notes (Signed)
Medical screening examination/treatment/procedure(s) were performed by non-physician practitioner and as supervising physician I was immediately available for consultation/collaboration.   Ramaya Guile, MD 09/02/12 0534 

## 2012-09-07 ENCOUNTER — Encounter (HOSPITAL_COMMUNITY): Payer: Self-pay | Admitting: Emergency Medicine

## 2012-09-07 ENCOUNTER — Emergency Department (HOSPITAL_COMMUNITY)
Admission: EM | Admit: 2012-09-07 | Discharge: 2012-09-07 | Disposition: A | Discharge status: Home / Self Care | Payer: Self-pay | Attending: Emergency Medicine | Admitting: Emergency Medicine

## 2012-09-07 DIAGNOSIS — H9210 Otorrhea, unspecified ear: Secondary | ICD-10-CM | POA: Insufficient documentation

## 2012-09-07 DIAGNOSIS — Z79899 Other long term (current) drug therapy: Secondary | ICD-10-CM | POA: Insufficient documentation

## 2012-09-07 DIAGNOSIS — F329 Major depressive disorder, single episode, unspecified: Secondary | ICD-10-CM | POA: Insufficient documentation

## 2012-09-07 DIAGNOSIS — Z8679 Personal history of other diseases of the circulatory system: Secondary | ICD-10-CM | POA: Insufficient documentation

## 2012-09-07 DIAGNOSIS — Z87448 Personal history of other diseases of urinary system: Secondary | ICD-10-CM | POA: Insufficient documentation

## 2012-09-07 DIAGNOSIS — H6692 Otitis media, unspecified, left ear: Secondary | ICD-10-CM

## 2012-09-07 DIAGNOSIS — E785 Hyperlipidemia, unspecified: Secondary | ICD-10-CM | POA: Insufficient documentation

## 2012-09-07 DIAGNOSIS — J3489 Other specified disorders of nose and nasal sinuses: Secondary | ICD-10-CM | POA: Insufficient documentation

## 2012-09-07 DIAGNOSIS — Z7982 Long term (current) use of aspirin: Secondary | ICD-10-CM | POA: Insufficient documentation

## 2012-09-07 DIAGNOSIS — Z872 Personal history of diseases of the skin and subcutaneous tissue: Secondary | ICD-10-CM | POA: Insufficient documentation

## 2012-09-07 DIAGNOSIS — H9190 Unspecified hearing loss, unspecified ear: Secondary | ICD-10-CM | POA: Insufficient documentation

## 2012-09-07 DIAGNOSIS — H6690 Otitis media, unspecified, unspecified ear: Secondary | ICD-10-CM | POA: Insufficient documentation

## 2012-09-07 DIAGNOSIS — I10 Essential (primary) hypertension: Secondary | ICD-10-CM | POA: Insufficient documentation

## 2012-09-07 DIAGNOSIS — Z9889 Other specified postprocedural states: Secondary | ICD-10-CM | POA: Insufficient documentation

## 2012-09-07 DIAGNOSIS — Z8739 Personal history of other diseases of the musculoskeletal system and connective tissue: Secondary | ICD-10-CM | POA: Insufficient documentation

## 2012-09-07 DIAGNOSIS — G4700 Insomnia, unspecified: Secondary | ICD-10-CM | POA: Insufficient documentation

## 2012-09-07 DIAGNOSIS — F3289 Other specified depressive episodes: Secondary | ICD-10-CM | POA: Insufficient documentation

## 2012-09-07 MED ORDER — ANTIPYRINE-BENZOCAINE 5.4-1.4 % OT SOLN
3.0000 [drp] | Freq: Once | OTIC | Status: AC
  Administered 2012-09-07: 3 [drp] via OTIC
  Filled 2012-09-07: qty 10

## 2012-09-07 MED ORDER — ANTIPYRINE-BENZOCAINE 5.4-1.4 % OT SOLN: 3.0000 [drp] | OTIC | Status: DC | PRN

## 2012-09-07 MED ORDER — FLUTICASONE PROPIONATE 50 MCG/ACT NA SUSP: 2.0000 | Freq: Every day | NASAL | Status: DC

## 2012-09-07 MED ORDER — AMOXICILLIN 500 MG PO CAPS: 500.0000 mg | ORAL_CAPSULE | Freq: Three times a day (TID) | ORAL | Status: DC

## 2012-09-07 NOTE — ED Notes (Signed)
Patient states unable to afford medication. Will contact case manager if patient eligible for assistance.

## 2012-09-07 NOTE — ED Provider Notes (Signed)
History     CSN: [id number]  Arrival date & time 09/07/12  0946   First MD Initiated Contact with Patient 09/07/12 1000      Chief Complaint  Patient presents with  . Otalgia  . 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 He presents today with bilateral ear pain left worse than right. It is associated with sinus congestion. She does not want to take any decongestants because he knows it is bad for his blood pressure. This has been worsening over the past week. On Tuesday he had an episode of yellow drainage. He has had none since then. This is associated with decreased hearing bilaterally, sinus congestion, rhinorrhea. No fever, chills, nausea, vomiting, diarrhea, abdominal pain, and numbness, weakness, mastoid tenderness.  Patient is a 57 y.o. male presenting with ear pain. The history is provided by the patient. No language interpreter was used.  Otalgia Location:  Bilateral Behind ear:  No abnormality Quality:  Sharp Severity:  Severe Onset quality:  Sudden Duration:  1 week Timing:  Constant Progression:  Worsening Chronicity:  New Context: not direct blow and not foreign body in ear   Relieved by:  Nothing Worsened by:  Nothing tried Ineffective treatments:  None tried Associated symptoms: congestion and ear discharge   Associated symptoms: no abdominal pain, no diarrhea, no fever, no headaches, no neck pain and no vomiting   Congestion:    Location:  Nasal   Interferes with sleep: yes     Interferes with eating/drinking: no   Risk factors: prior ear surgery (30 years ago)     Past Medical History  Diagnosis Date  . Chest pain, atypical     12/2003:  negative cardiolyte  . HTN (hypertension)   . HLD (hyperlipidemia)   . Depression   . Hx of tear of ACL (anterior cruciate ligament)      right  . History of meniscal tear     bilateral  . Erectile dysfunction   . Insomnia   . Eczema   . Olecranon bursitis of left elbow 10/2009    s/p I&D by Dr Jillyn Hidden, initially assessed by Dr. Jennette Kettle     Past Surgical History  Procedure Laterality Date  . Skin tag removal      11 removed  . Knee arthroscopy    . Incise and drain abcess      L elbow due to cellulitis/bursitis  . Inner ear surgery      No family history on file.  History  Substance Use Topics  . Smoking status: Never Smoker   . Smokeless tobacco: Not on file  . Alcohol Use: No      Review of Systems  Constitutional: Negative for fever and chills.  HENT: Positive for ear pain, congestion and ear discharge. Negative for neck pain.   Respiratory: Negative for chest tightness and shortness of breath.   Gastrointestinal: Negative for vomiting, abdominal pain and diarrhea.  Neurological: Negative for headaches.  All other systems reviewed and are negative.    Allergies  Review of patient's allergies indicates no known allergies.  Home Medications   Current Outpatient Rx  Name  Route  Sig  Dispense  Refill  . aspirin 81 MG chewable tablet   Oral   Chew 81 mg by mouth daily.           Marland Kitchen ibuprofen (  ADVIL,MOTRIN) 800 MG tablet   Oral   Take 800 mg by mouth every 8 (eight) hours as needed for pain.         Marland Kitchen lisinopril (PRINIVIL,ZESTRIL) 20 MG tablet   Oral   Take 40 mg by mouth every morning.         Marland Kitchen PRESCRIPTION MEDICATION   Oral   Take 1 tablet by mouth every morning. Blood pressure medication         . PRESCRIPTION MEDICATION   Oral   Take 1 tablet by mouth every morning. Blood pressure medication           BP 150/90  Pulse 94  Temp(Src) 98.1 F (36.7 C) (Oral)  Resp 16  SpO2 98%  Physical Exam  Nursing note and vitals reviewed. Constitutional: He is oriented to person, place, and time. He appears well-developed and well-nourished. No distress.  HENT:  Head:  Normocephalic and atraumatic.  Right Ear: External ear normal. No drainage. Tympanic membrane is retracted. Tympanic membrane is not injected, not scarred and not perforated. Decreased hearing is noted.  Left Ear: External ear normal. No drainage. Tympanic membrane is injected, scarred and bulging. Decreased hearing is noted.  Nasal turbinates swollen bilaterally No signs of TM perforation bilaterally  Eyes: Conjunctivae are normal.  Neck: Normal range of motion. No tracheal deviation present.  Cardiovascular: Normal rate, regular rhythm and normal heart sounds.   Pulmonary/Chest: Effort normal and breath sounds normal. No stridor.  Abdominal: Soft. He exhibits no distension. There is no tenderness.  Musculoskeletal: Normal range of motion.  Neurological: He is alert and oriented to person, place, and time.  Skin: Skin is warm and dry. He is not diaphoretic.  Psychiatric: He has a normal mood and affect. His behavior is normal.    ED Course  Procedures (including critical care time)  Labs Reviewed - No data to display No results found.   1. Otitis media, left       MDM  He presents with one week of worsening bilateral ear pain with left worse than right. On exam left TM is injected and bulging. There is a central area that appears scarred. Suspect otitis media. Covered with amoxicillin. Auralgan drops given in ED with some relief. Encouraged to talk to pharmacist about over-the-counter congestion medications that he can take before his job interview. Fluticasone rx given. Resource guide given. Establish care with PCP. Return instructions given. Vital signs stable for discharge.Patient / Family / Caregiver informed of clinical course, understand medical decision-making process, and agree with plan.         Mora Bellman, PA-C 09/07/12 1600

## 2012-09-07 NOTE — ED Notes (Signed)
Case manager spoke with patient at bedside.  

## 2012-09-07 NOTE — ED Notes (Signed)
Case manager spoke with patient and is able to assist with patient medication.

## 2012-09-07 NOTE — Progress Notes (Signed)
09/07/2012 1130 NCM spoke to pt and states he lost his job in March. He has no funds for medications. Will provided pt with MATCH program to assist with medications. Explained program can only be used once per year. And no narcotics can be used with this program. Pt has information on community resources and community clinics that accept self pay pt to arrange an appt for follow up in 1-2 weeks. Isidoro Donning RN CCM Case Mgmt phone [phone]

## 2012-09-07 NOTE — ED Notes (Signed)
Onset one week ago bilateral ear pain history of plastic ear drum left ear.  States one episodes of clear slight yellow drainage 4 days ago.

## 2012-09-08 NOTE — ED Provider Notes (Signed)
Medical screening examination/treatment/procedure(s) were performed by non-physician practitioner and as supervising physician I was immediately available for consultation/collaboration.   Celene Kras, MD 09/08/12 [phone]

## 2014-09-20 ENCOUNTER — Encounter (HOSPITAL_COMMUNITY): Payer: Self-pay | Admitting: Emergency Medicine

## 2014-09-20 ENCOUNTER — Emergency Department (HOSPITAL_COMMUNITY)
Admission: EM | Admit: 2014-09-20 | Discharge: 2014-09-21 | Disposition: A | Discharge status: Home / Self Care | Payer: Self-pay | Attending: Emergency Medicine | Admitting: Emergency Medicine

## 2014-09-20 DIAGNOSIS — R110 Nausea: Secondary | ICD-10-CM | POA: Insufficient documentation

## 2014-09-20 DIAGNOSIS — L551 Sunburn of second degree: Secondary | ICD-10-CM | POA: Insufficient documentation

## 2014-09-20 DIAGNOSIS — Z7982 Long term (current) use of aspirin: Secondary | ICD-10-CM | POA: Insufficient documentation

## 2014-09-20 DIAGNOSIS — Z8739 Personal history of other diseases of the musculoskeletal system and connective tissue: Secondary | ICD-10-CM | POA: Insufficient documentation

## 2014-09-20 DIAGNOSIS — L550 Sunburn of first degree: Secondary | ICD-10-CM

## 2014-09-20 DIAGNOSIS — Z87448 Personal history of other diseases of urinary system: Secondary | ICD-10-CM | POA: Insufficient documentation

## 2014-09-20 DIAGNOSIS — Z8659 Personal history of other mental and behavioral disorders: Secondary | ICD-10-CM | POA: Insufficient documentation

## 2014-09-20 DIAGNOSIS — I10 Essential (primary) hypertension: Secondary | ICD-10-CM | POA: Insufficient documentation

## 2014-09-20 DIAGNOSIS — Z8639 Personal history of other endocrine, nutritional and metabolic disease: Secondary | ICD-10-CM | POA: Insufficient documentation

## 2014-09-20 NOTE — ED Notes (Signed)
Pt reports being out in the sun on Saturday for about 3 hrs. Pt states he now has sunburn on face and legs. Pt reports he believes the sun burn is making him sick and he thinks he could have sun poisoning. Pt redness and swelling around eyes bilaterally.

## 2014-09-21 MED ORDER — SILVER SULFADIAZINE 1 % EX CREA: 1.0000 "application " | TOPICAL_CREAM | Freq: Every day | CUTANEOUS | Status: DC

## 2014-09-21 NOTE — Discharge Instructions (Signed)
Sunburn Sunburn is damage to the skin caused by overexposure to ultraviolet (UV) rays. People with light skin or a fair complexion may be more susceptible to sunburn. Repeated sun exposure causes early skin aging such as wrinkles and sun spots. It also increases the risk of skin cancer. CAUSES A sunburn is caused by getting too much UV radiation from the sun. SYMPTOMS  Red or pink skin.  Soreness and swelling.  Pain.  Blisters.  Peeling skin.  Headache, fever, and fatigue if sunburn covers a large area. TREATMENT  Your caregiver may tell you to take certain medicines to lessen inflammation.  Your caregiver may have you use hydrocortisone cream or spray to help with itching and inflammation.  Your caregiver may prescribe an antibiotic cream to use on blisters. HOME CARE INSTRUCTIONS   Avoid further exposure to the sun.  Cool baths and cool compresses may be helpful if used several times per day. Do not apply ice, since this may result in more damage to the skin.  Only take over-the-counter or prescription medicines for pain, discomfort, or fever as directed by your caregiver.  Use aloe or other over-the-counter sunburn creams or gels on your skin. Do not apply these creams or gels on blisters.  Drink enough fluids to keep your urine clear or pale yellow.  Do not break blisters. If blisters break, your caregiver may recommend an antibiotic cream to apply to the affected area. PREVENTION   Try to avoid the sun between 10:00 a.m. and 4:00 p.m. when it is the strongest.  Apply sunscreen at least 30 minutes before exposure to the sun.  Always wear protective hats, clothing, and sunglasses with UV protection.  Avoid medicines, herbs, and foods that increase your sensitivity to sunlight.  Avoid tanning beds. SEEK IMMEDIATE MEDICAL CARE IF:   You have a fever.  Your pain is uncontrolled with medicine.  You start to vomit or have diarrhea.  You feel faint or develop a  headache with confusion.  You develop severe blistering.  You have a pus-like (purulent) discharge coming from the blisters.  Your burn becomes more painful and swollen. MAKE SURE YOU:  Understand these instructions.  Will watch your condition.  Will get help right away if you are not doing well or get worse. Document Released: 02/22/2005 Document Revised: 09/09/2012 Document Reviewed: 11/06/2010 [employer] Patient Information 2015 [employer]. This information is not intended to replace advice given to you by your health care provider. Make sure you discuss any questions you have with your health care provider.  

## 2014-09-21 NOTE — ED Provider Notes (Signed)
CSN: [id number]     Arrival date & time 09/20/14  2137 History   First MD Initiated Contact with Patient 09/21/14 0044     Chief Complaint  Patient presents with  . Sunburn     (Consider location/radiation/quality/duration/timing/severity/associated sxs/prior Treatment) HPI Comments: He complains of blistering burn to forehead after spending 2 hours in the sun watching his son's soccer game. He came to be evaluated after he became nauseous. No vomiting or fever. No other complaints.   The history is provided by the patient. No language interpreter was used.    Past Medical History  Diagnosis Date  . Chest pain, atypical     12/2003:  negative cardiolyte  . HTN (hypertension)   . HLD (hyperlipidemia)   . Depression   . Hx of tear of ACL (anterior cruciate ligament)     right  . History of meniscal tear     bilateral  . Erectile dysfunction   . Insomnia   . Eczema   . Olecranon bursitis of left elbow 10/2009    s/p I&D by Dr Maxie Better, initially assessed by Dr. Nori Riis    Past Surgical History  Procedure Laterality Date  . Skin tag removal      11 removed  . Knee arthroscopy    . Incise and drain abcess      L elbow due to cellulitis/bursitis  . Inner ear surgery     History reviewed. No pertinent family history. History  Substance Use Topics  . Smoking status: Never Smoker   . Smokeless tobacco: Not on file  . Alcohol Use: No    Review of Systems  Constitutional: Negative for fever and chills.  Gastrointestinal: Positive for nausea. Negative for vomiting.  Musculoskeletal: Negative.  Negative for myalgias.  Skin:       See HPI.  Neurological: Negative.  Negative for weakness and headaches.      Allergies  Review of patient's allergies indicates no known allergies.  Home Medications   Prior to Admission medications   Medication Sig Start Date End Date Taking? Authorizing Provider  amLODipine (NORVASC) 10 MG tablet Take 10 mg by mouth daily.   Yes Historical  Provider, MD  aspirin 81 MG chewable tablet Chew 81 mg by mouth daily.     Yes Historical Provider, MD  fluticasone (FLONASE) 50 MCG/ACT nasal spray Place 2 sprays into the nose daily. Patient taking differently: Place 2 sprays into the nose daily as needed for allergies or rhinitis.  09/07/12  Yes Cleatrice Burke, PA-C  ibuprofen (ADVIL,MOTRIN) 800 MG tablet Take 800 mg by mouth every 8 (eight) hours as needed for pain.   Yes Historical Provider, MD  lisinopril (PRINIVIL,ZESTRIL) 20 MG tablet Take 40 mg by mouth every morning.   Yes Historical Provider, MD  metoprolol succinate (TOPROL-XL) 100 MG 24 hr tablet Take 100 mg by mouth daily. Take with or immediately following a meal.   Yes Historical Provider, MD  amoxicillin (AMOXIL) 500 MG capsule Take 1 capsule (500 mg total) by mouth 3 (three) times daily. Patient not taking: Reported on 09/20/2014 09/07/12   Cleatrice Burke, PA-C  antipyrine-benzocaine Toniann Fail) otic solution Place 3 drops into the right ear every 2 (two) hours as needed for pain. Patient not taking: Reported on 09/20/2014 09/07/12   Cleatrice Burke, PA-C  silver sulfADIAZINE (SILVADENE) 1 % cream Apply 1 application topically daily. 09/21/14   Mishayla Sliwinski, PA-C   BP 162/93 mmHg  Pulse 74  Temp(Src) 97.8 F (36.6 C) (Oral)  Resp 16  SpO2 94% Physical Exam  Constitutional: He is oriented to person, place, and time. He appears well-developed and well-nourished.  Neck: Normal range of motion.  Pulmonary/Chest: Effort normal.  Musculoskeletal: Normal range of motion.  Neurological: He is alert and oriented to person, place, and time.  Skin: Skin is warm and dry.  Small open blisters to forehead over erythematous skin c/w mild 2nd degree sunburn. There is non-blistering burn to remainder of face.   Psychiatric: He has a normal mood and affect.    ED Course  Procedures (including critical care time) Labs Review Labs Reviewed - No data to display  Imaging Review No results  found.   EKG Interpretation None      MDM   Final diagnoses:  Sunburn, second degree  Sunburn of first degree    Sunburn requiring supportive care only. He is stable for discharge home.    Charlann Lange, PA-C 09/21/14 [place], MD 09/21/14 [phone]

## 2014-10-08 ENCOUNTER — Emergency Department (HOSPITAL_COMMUNITY)
Admission: EM | Admit: 2014-10-08 | Discharge: 2014-10-08 | Disposition: A | Discharge status: Home / Self Care | Payer: Self-pay | Attending: Emergency Medicine | Admitting: Emergency Medicine

## 2014-10-08 ENCOUNTER — Encounter (HOSPITAL_COMMUNITY): Payer: Self-pay

## 2014-10-08 DIAGNOSIS — Z87448 Personal history of other diseases of urinary system: Secondary | ICD-10-CM | POA: Insufficient documentation

## 2014-10-08 DIAGNOSIS — Z872 Personal history of diseases of the skin and subcutaneous tissue: Secondary | ICD-10-CM | POA: Insufficient documentation

## 2014-10-08 DIAGNOSIS — Z8659 Personal history of other mental and behavioral disorders: Secondary | ICD-10-CM | POA: Insufficient documentation

## 2014-10-08 DIAGNOSIS — Z8739 Personal history of other diseases of the musculoskeletal system and connective tissue: Secondary | ICD-10-CM | POA: Insufficient documentation

## 2014-10-08 DIAGNOSIS — Z87828 Personal history of other (healed) physical injury and trauma: Secondary | ICD-10-CM | POA: Insufficient documentation

## 2014-10-08 DIAGNOSIS — Z792 Long term (current) use of antibiotics: Secondary | ICD-10-CM | POA: Insufficient documentation

## 2014-10-08 DIAGNOSIS — Z8669 Personal history of other diseases of the nervous system and sense organs: Secondary | ICD-10-CM | POA: Insufficient documentation

## 2014-10-08 DIAGNOSIS — R200 Anesthesia of skin: Secondary | ICD-10-CM | POA: Insufficient documentation

## 2014-10-08 DIAGNOSIS — Z7982 Long term (current) use of aspirin: Secondary | ICD-10-CM | POA: Insufficient documentation

## 2014-10-08 DIAGNOSIS — Z9114 Patient's other noncompliance with medication regimen: Secondary | ICD-10-CM | POA: Insufficient documentation

## 2014-10-08 DIAGNOSIS — Z7951 Long term (current) use of inhaled steroids: Secondary | ICD-10-CM | POA: Insufficient documentation

## 2014-10-08 DIAGNOSIS — I10 Essential (primary) hypertension: Secondary | ICD-10-CM | POA: Insufficient documentation

## 2014-10-08 DIAGNOSIS — Z8639 Personal history of other endocrine, nutritional and metabolic disease: Secondary | ICD-10-CM | POA: Insufficient documentation

## 2014-10-08 LAB — CBG MONITORING, ED: GLUCOSE-CAPILLARY: 109 mg/dL — AB (ref 65–99)

## 2014-10-08 MED ORDER — LISINOPRIL 40 MG PO TABS
40.0000 mg | ORAL_TABLET | Freq: Once | ORAL | Status: AC
  Administered 2014-10-08: 40 mg via ORAL
  Filled 2014-10-08: qty 1

## 2014-10-08 MED ORDER — METOPROLOL TARTRATE 50 MG PO TABS: 50.0000 mg | ORAL_TABLET | Freq: Two times a day (BID) | ORAL | Status: DC

## 2014-10-08 MED ORDER — AMLODIPINE BESYLATE 10 MG PO TABS: 10.0000 mg | ORAL_TABLET | Freq: Every day | ORAL | Status: DC

## 2014-10-08 MED ORDER — AMLODIPINE BESYLATE 10 MG PO TABS
10.0000 mg | ORAL_TABLET | Freq: Once | ORAL | Status: AC
  Administered 2014-10-08: 10 mg via ORAL
  Filled 2014-10-08: qty 1

## 2014-10-08 MED ORDER — LISINOPRIL 20 MG PO TABS: 40.0000 mg | ORAL_TABLET | Freq: Every morning | ORAL | Status: DC

## 2014-10-08 MED ORDER — METOPROLOL TARTRATE 25 MG PO TABS
50.0000 mg | ORAL_TABLET | Freq: Once | ORAL | Status: AC
  Administered 2014-10-08: 50 mg via ORAL
  Filled 2014-10-08: qty 2

## 2014-10-08 NOTE — ED Notes (Signed)
Pt has been out of bp meds x 1 week. Insurance has not started.  Pt has an appt coming up with an MD.  [facility] pharmacy and not able to get meds filled.  Pt says he is scared and feels heart pounding in his ears.

## 2014-10-08 NOTE — ED Notes (Signed)
Pt reports feeling that his bp is elevated and states it is bc he is out of medications. No other complaints at this time.

## 2014-10-08 NOTE — ED Provider Notes (Signed)
CSN: [id number]     Arrival date & time 10/08/14  1009 History   First MD Initiated Contact with Patient 10/08/14 1032     Chief Complaint  Patient presents with  . Medication Refill  . Hypertension     (Consider location/radiation/quality/duration/timing/severity/associated sxs/prior Treatment) Patient is a 59 y.o. male presenting with hypertension. The history is provided by the patient.  Hypertension This is a chronic problem. Pertinent negatives include no chest pain, no abdominal pain, no headaches and no shortness of breath.   patient presents for medication refill. States he's been out of his blood pressure medicines for about a week. States he's been switching between providers and has been unable to As much medication as he needs. States he went to the pharmacy and they were only able to give him a three-day refill. States he does have follow-up arranged. No chest pain. States he does occasionally have some numbness in his hands because certain positions. States he is urinating about every 2 hours but this is not changed for him. States is because he is 59 years old. He appears somewhat anxious. No headache but does have a pounding in his head. He states this is how he knows his blood pressure goes up. No vision changes. No nausea vomiting. No diaphoresis.  Past Medical History  Diagnosis Date  . Chest pain, atypical     12/2003:  negative cardiolyte  . HTN (hypertension)   . HLD (hyperlipidemia)   . Depression   . Hx of tear of ACL (anterior cruciate ligament)     right  . History of meniscal tear     bilateral  . Erectile dysfunction   . Insomnia   . Eczema   . Olecranon bursitis of left elbow 10/2009    s/p I&D by Dr Maxie Better, initially assessed by Dr. Nori Riis    Past Surgical History  Procedure Laterality Date  . Skin tag removal      11 removed  . Knee arthroscopy    . Incise and drain abcess      L elbow due to cellulitis/bursitis  . Inner ear surgery     History  reviewed. No pertinent family history. History  Substance Use Topics  . Smoking status: Never Smoker   . Smokeless tobacco: Not on file  . Alcohol Use: No    Review of Systems  Constitutional: Negative for activity change and appetite change.  Eyes: Negative for pain.  Respiratory: Negative for chest tightness and shortness of breath.   Cardiovascular: Negative for chest pain and leg swelling.  Gastrointestinal: Negative for nausea, vomiting, abdominal pain and diarrhea.  Genitourinary: Negative for flank pain.  Musculoskeletal: Negative for back pain and neck stiffness.  Skin: Negative for rash.  Neurological: Positive for numbness. Negative for weakness and headaches.  Psychiatric/Behavioral: Negative for behavioral problems.      Allergies  Review of patient's allergies indicates no known allergies.  Home Medications   Prior to Admission medications   Medication Sig Start Date End Date Taking? Authorizing Provider  aspirin 81 MG chewable tablet Chew 81 mg by mouth daily.     Yes Historical Provider, MD  fluticasone (FLONASE) 50 MCG/ACT nasal spray Place 2 sprays into the nose daily. Patient taking differently: Place 2 sprays into the nose daily as needed for allergies or rhinitis.  09/07/12  Yes Cleatrice Burke, PA-C  ibuprofen (ADVIL,MOTRIN) 800 MG tablet Take 800 mg by mouth every 8 (eight) hours as needed for pain.   Yes Historical  Provider, MD  silver sulfADIAZINE (SILVADENE) 1 % cream Apply 1 application topically daily. 09/21/14  Yes Shari Upstill, PA-C  amLODipine (NORVASC) 10 MG tablet Take 1 tablet (10 mg total) by mouth daily. 10/08/14   Davonna Belling, MD  amoxicillin (AMOXIL) 500 MG capsule Take 1 capsule (500 mg total) by mouth 3 (three) times daily. Patient not taking: Reported on 09/20/2014 09/07/12   Cleatrice Burke, PA-C  antipyrine-benzocaine Toniann Fail) otic solution Place 3 drops into the right ear every 2 (two) hours as needed for pain. Patient not taking:  Reported on 09/20/2014 09/07/12   Cleatrice Burke, PA-C  lisinopril (PRINIVIL,ZESTRIL) 20 MG tablet Take 2 tablets (40 mg total) by mouth every morning. 10/08/14   Davonna Belling, MD  metoprolol (LOPRESSOR) 50 MG tablet Take 1 tablet (50 mg total) by mouth 2 (two) times daily. 10/08/14   Davonna Belling, MD   BP 180/96 mmHg  Pulse 85  Temp(Src) 98.2 F (36.8 C) (Oral)  Resp 20  SpO2 98% Physical Exam  Constitutional: He is oriented to person, place, and time. He appears well-developed and well-nourished.  HENT:  Head: Normocephalic and atraumatic.  Eyes: Pupils are equal, round, and reactive to light.  Neck: Normal range of motion. Neck supple.  Cardiovascular: Normal rate, regular rhythm and normal heart sounds.   No murmur heard. Pulmonary/Chest: Effort normal and breath sounds normal.  Abdominal: Soft. Bowel sounds are normal. He exhibits no distension.  Musculoskeletal: Normal range of motion. He exhibits no edema.  Neurological: He is alert and oriented to person, place, and time. No cranial nerve deficit.  Face symmetric. Good grip strength bilaterally. Good sensation in both hands.  Skin: Skin is warm and dry.  Psychiatric:  Patient appears somewhat anxious.  Nursing note and vitals reviewed.   ED Course  Procedures (including critical care time) Labs Review Labs Reviewed  CBG MONITORING, ED - Abnormal; Notable for the following:    Glucose-Capillary 109 (*)    All other components within normal limits    Imaging Review No results found.   EKG Interpretation   Date/Time:  Thursday Oct 08 2014 10:56:02 EDT Ventricular Rate:  83 PR Interval:  148 QRS Duration: 97 QT Interval:  382 QTC Calculation: 449 R Axis:   72 Text Interpretation:  Sinus rhythm Low voltage, precordial leads Confirmed  by Alvino Chapel  MD, Ovid Curd [phone]) on 10/08/2014 11:13:00 AM      MDM   Final diagnoses:  Essential hypertension  Noncompliance with medications    Patient with  hypertension. Has been off his medications. Has some urinary frequency glucose is only slightly elevated. Does have follow-up with the primary care doctor. Has started patient back on his antihypertensives and will discharge home. EKG reassuring. I doubt severe end organ damage at this time.    Davonna Belling, MD 10/08/14 [phone]

## 2014-10-08 NOTE — Discharge Instructions (Signed)

## 2014-10-08 NOTE — ED Notes (Signed)
MD at bedside. 

## 2014-10-25 DIAGNOSIS — H9193 Unspecified hearing loss, bilateral: Secondary | ICD-10-CM | POA: Insufficient documentation

## 2014-11-16 ENCOUNTER — Telehealth: Payer: Self-pay

## 2014-11-16 NOTE — Telephone Encounter (Signed)
Rec'd from [place] forward 9 pages to Historical Provider

## 2014-11-19 ENCOUNTER — Encounter: Payer: Self-pay | Admitting: Gastroenterology

## 2014-12-22 ENCOUNTER — Encounter: Payer: Self-pay | Admitting: Gastroenterology

## 2015-01-27 ENCOUNTER — Encounter: Payer: Self-pay | Admitting: Gastroenterology

## 2015-02-08 ENCOUNTER — Emergency Department (HOSPITAL_COMMUNITY)
Admission: EM | Admit: 2015-02-08 | Discharge: 2015-02-08 | Disposition: A | Discharge status: Home / Self Care | Payer: Managed Care, Other (non HMO) | Attending: Emergency Medicine | Admitting: Emergency Medicine

## 2015-02-08 ENCOUNTER — Encounter (HOSPITAL_COMMUNITY): Payer: Self-pay | Admitting: Emergency Medicine

## 2015-02-08 DIAGNOSIS — Z7982 Long term (current) use of aspirin: Secondary | ICD-10-CM | POA: Insufficient documentation

## 2015-02-08 DIAGNOSIS — Z87438 Personal history of other diseases of male genital organs: Secondary | ICD-10-CM | POA: Diagnosis not present

## 2015-02-08 DIAGNOSIS — Z7951 Long term (current) use of inhaled steroids: Secondary | ICD-10-CM | POA: Insufficient documentation

## 2015-02-08 DIAGNOSIS — Y939 Activity, unspecified: Secondary | ICD-10-CM | POA: Insufficient documentation

## 2015-02-08 DIAGNOSIS — Z79899 Other long term (current) drug therapy: Secondary | ICD-10-CM | POA: Insufficient documentation

## 2015-02-08 DIAGNOSIS — Y929 Unspecified place or not applicable: Secondary | ICD-10-CM | POA: Diagnosis not present

## 2015-02-08 DIAGNOSIS — Z872 Personal history of diseases of the skin and subcutaneous tissue: Secondary | ICD-10-CM | POA: Diagnosis not present

## 2015-02-08 DIAGNOSIS — I10 Essential (primary) hypertension: Secondary | ICD-10-CM | POA: Diagnosis not present

## 2015-02-08 DIAGNOSIS — Y998 Other external cause status: Secondary | ICD-10-CM | POA: Insufficient documentation

## 2015-02-08 DIAGNOSIS — Z8739 Personal history of other diseases of the musculoskeletal system and connective tissue: Secondary | ICD-10-CM | POA: Diagnosis not present

## 2015-02-08 DIAGNOSIS — Z8669 Personal history of other diseases of the nervous system and sense organs: Secondary | ICD-10-CM | POA: Insufficient documentation

## 2015-02-08 DIAGNOSIS — X58XXXA Exposure to other specified factors, initial encounter: Secondary | ICD-10-CM | POA: Insufficient documentation

## 2015-02-08 DIAGNOSIS — R0982 Postnasal drip: Secondary | ICD-10-CM | POA: Diagnosis not present

## 2015-02-08 DIAGNOSIS — S0591XA Unspecified injury of right eye and orbit, initial encounter: Secondary | ICD-10-CM | POA: Diagnosis present

## 2015-02-08 DIAGNOSIS — F329 Major depressive disorder, single episode, unspecified: Secondary | ICD-10-CM | POA: Diagnosis not present

## 2015-02-08 DIAGNOSIS — S0501XA Injury of conjunctiva and corneal abrasion without foreign body, right eye, initial encounter: Secondary | ICD-10-CM | POA: Diagnosis not present

## 2015-02-08 MED ORDER — FLUORESCEIN SODIUM 1 MG OP STRP
1.0000 | ORAL_STRIP | Freq: Once | OPHTHALMIC | Status: AC
  Administered 2015-02-08: 1 via OPHTHALMIC
  Filled 2015-02-08: qty 1

## 2015-02-08 MED ORDER — ERYTHROMYCIN 5 MG/GM OP OINT: 1.0000 "application " | TOPICAL_OINTMENT | Freq: Three times a day (TID) | OPHTHALMIC | Status: DC

## 2015-02-08 MED ORDER — TETRACAINE HCL 0.5 % OP SOLN
2.0000 [drp] | Freq: Once | OPHTHALMIC | Status: AC
  Administered 2015-02-08: 2 [drp] via OPHTHALMIC
  Filled 2015-02-08: qty 2

## 2015-02-08 NOTE — ED Notes (Signed)
Pt reports he was working on his car yesterday and "stuff was flying around". C/o of right eye pain. Patient wears contacts, no relief of soreness with saline flush at home. Sensitivity to light. Blurred vision. Redness noted to same.

## 2015-02-08 NOTE — Discharge Instructions (Signed)
Use erythromycin ointment as prescribed. Follow up with Dr. Talbert Forest at [facility] tomorrow to ensure proper healing and no symptom worsening. You may also follow up with your Optometrist, if desired. Return to the ED, as needed, if symptoms worsen.  Corneal Abrasion The cornea is the clear covering at the front and center of the eye. When looking at the colored portion of the eye (iris), you are looking through the cornea. This very thin tissue is made up of many layers. The surface layer is a single layer of cells (corneal epithelium) and is one of the most sensitive tissues in the body. If a scratch or injury causes the corneal epithelium to come off, it is called a corneal abrasion. If the injury extends to the tissues below the epithelium, the condition is called a corneal ulcer. CAUSES   Scratches.  Trauma.  Foreign body in the eye. Some people have recurrences of abrasions in the area of the original injury even after it has healed (recurrent erosion syndrome). Recurrent erosion syndrome generally improves and goes away with time. SYMPTOMS   Eye pain.  Difficulty or inability to keep the injured eye open.  The eye becomes very sensitive to light.  Recurrent erosions tend to happen suddenly, first thing in the morning, usually after waking up and opening the eye. DIAGNOSIS  Your health care provider can diagnose a corneal abrasion during an eye exam. Dye is usually placed in the eye using a drop or a small paper strip moistened by your tears. When the eye is examined with a special light, the abrasion shows up clearly because of the dye. TREATMENT   Small abrasions may be treated with antibiotic drops or ointment alone.  A pressure patch may be put over the eye. If this is done, follow your doctor's instructions for when to remove the patch. Do not drive or use machines while the eye patch is on. Judging distances is hard to do with a patch on. If the abrasion becomes infected and spreads to  the deeper tissues of the cornea, a corneal ulcer can result. This is serious because it can cause corneal scarring. Corneal scars interfere with light passing through the cornea and cause a loss of vision in the involved eye. HOME CARE INSTRUCTIONS  Use medicine or ointment as directed. Only take over-the-counter or prescription medicines for pain, discomfort, or fever as directed by your health care provider.  Do not drive or operate machinery if your eye is patched. Your ability to judge distances is impaired.  If your health care provider has given you a follow-up appointment, it is very important to keep that appointment. Not keeping the appointment could result in a severe eye infection or permanent loss of vision. If there is any problem keeping the appointment, let your health care provider know. SEEK MEDICAL CARE IF:   You have pain, light sensitivity, and a scratchy feeling in one eye or both eyes.  Your pressure patch keeps loosening up, and you can blink your eye under the patch after treatment.  Any kind of discharge develops from the eye after treatment or if the lids stick together in the morning.  You have the same symptoms in the morning as you did with the original abrasion days, weeks, or months after the abrasion healed. MAKE SURE YOU:   Understand these instructions.  Will watch your condition.  Will get help right away if you are not doing well or get worse. Document Released: 05/12/2000 Document Revised: 05/20/2013  Document Reviewed: 01/20/2013 [facility] Patient Information 2015 [place], [place]. This information is not intended to replace advice given to you by your health care provider. Make sure you discuss any questions you have with your health care provider.

## 2015-02-08 NOTE — ED Provider Notes (Signed)
CSN: [id number]     Arrival date & time 02/08/15  1924 History  This chart was scribed for Antonietta Breach, PA-C, working with Deno Etienne, DO by Steva Colder, ED Scribe. The patient was seen in room WTR9/WTR9 at 8:29 PM.     No chief complaint on file.   The history is provided by the patient. No language interpreter was used.    HPI Comments: [name] is a 59 y.o. male who presents to the Emergency Department complaining of right eye pain onset yesterday morning. Pt reports that he was working on his car yesterday and there were things flying around at the time. Pt does wear contact lenses and he had no relief with his symptoms with saline flush at home. Pt notes that he only has his left contact in and not his right one at this time. He states that he is having associated symptoms of photophobia, blurred vision, right eye redness, right eye discharge, and post nasal drip. He states that he has tried saline rinse with no relief for his symptoms. He denies any other symptoms. Pt does have an optometrist.   Past Medical History  Diagnosis Date  . Chest pain, atypical     12/2003:  negative cardiolyte  . HTN (hypertension)   . HLD (hyperlipidemia)   . Depression   . Hx of tear of ACL (anterior cruciate ligament)     right  . History of meniscal tear     bilateral  . Erectile dysfunction   . Insomnia   . Eczema   . Olecranon bursitis of left elbow 10/2009    s/p I&D by Dr Maxie Better, initially assessed by Dr. Nori Riis    Past Surgical History  Procedure Laterality Date  . Skin tag removal      11 removed  . Knee arthroscopy    . Incise and drain abcess      L elbow due to cellulitis/bursitis  . Inner ear surgery     No family history on file. Social History  Substance Use Topics  . Smoking status: Never Smoker   . Smokeless tobacco: None  . Alcohol Use: No    Review of Systems  HENT: Positive for postnasal drip.   Eyes: Positive for photophobia, pain, redness and visual disturbance.        Watery right eye  All other systems reviewed and are negative.   Allergies  Review of patient's allergies indicates no known allergies.  Home Medications   Prior to Admission medications   Medication Sig Start Date End Date Taking? Authorizing Provider  amLODipine (NORVASC) 10 MG tablet Take 1 tablet (10 mg total) by mouth daily. 10/08/14   Davonna Belling, MD  amoxicillin (AMOXIL) 500 MG capsule Take 1 capsule (500 mg total) by mouth 3 (three) times daily. Patient not taking: Reported on 09/20/2014 09/07/12   Cleatrice Burke, PA-C  antipyrine-benzocaine Toniann Fail) otic solution Place 3 drops into the right ear every 2 (two) hours as needed for pain. Patient not taking: Reported on 09/20/2014 09/07/12   Cleatrice Burke, PA-C  aspirin 81 MG chewable tablet Chew 81 mg by mouth daily.      Historical Provider, MD  erythromycin ophthalmic ointment Place 1 application into the right eye 3 (three) times daily. Place 1/2 inch ribbon of ointment in the affected eye 3 times a day 02/08/15   Antonietta Breach, PA-C  fluticasone [facility]) 50 MCG/ACT nasal spray Place 2 sprays into the nose daily. Patient taking differently: Place 2  sprays into the nose daily as needed for allergies or rhinitis.  09/07/12   Cleatrice Burke, PA-C  ibuprofen (ADVIL,MOTRIN) 800 MG tablet Take 800 mg by mouth every 8 (eight) hours as needed for pain.    Historical Provider, MD  lisinopril (PRINIVIL,ZESTRIL) 20 MG tablet Take 2 tablets (40 mg total) by mouth every morning. 10/08/14   Davonna Belling, MD  metoprolol (LOPRESSOR) 50 MG tablet Take 1 tablet (50 mg total) by mouth 2 (two) times daily. 10/08/14   Davonna Belling, MD  silver sulfADIAZINE (SILVADENE) 1 % cream Apply 1 application topically daily. 09/21/14   Shari Upstill, PA-C   BP 167/75 mmHg  Pulse 71  Temp(Src) 98.4 F (36.9 C) (Oral)  Resp 16  SpO2 98%   Physical Exam  Constitutional: He is oriented to person, place, and time. He appears well-developed and  well-nourished. No distress.  Nontoxic/nonseptic appearing  HENT:  Head: Normocephalic and atraumatic.  Eyes: EOM are normal. Pupils are equal, round, and reactive to light. Right eye exhibits discharge (clear). Right conjunctiva is injected. Right conjunctiva has no hemorrhage. Left conjunctiva is not injected. Left conjunctiva has no hemorrhage. No scleral icterus.  Fundoscopic exam:      The right eye shows red reflex.  Slit lamp exam:      The right eye shows corneal abrasion and fluorescein uptake. The right eye shows no corneal ulcer and no hyphema.    EOMs normal without nystagmus. PERRL. Conjunctival injection noted to the R eye. There is consensual and direct photophobia on exam. No proptosis or hyphema. Negative Seidel's sign. No concern for globe rupture. There appears to be a corneal abrasion to the medial R cornea. No rust ring noted. No ulceration or dendritic staining. IOP 15 with 95% CI. Snellen 20/40 OU, 20/50 OD, 20/40 OS.  Neck: Normal range of motion.  Pulmonary/Chest: Effort normal. No respiratory distress.  Musculoskeletal: Normal range of motion.  Neurological: He is alert and oriented to person, place, and time. He exhibits normal muscle tone. Coordination normal.  Skin: Skin is warm and dry. No rash noted. He is not diaphoretic. No erythema. No pallor.  Psychiatric: He has a normal mood and affect. His behavior is normal.  Nursing note and vitals reviewed.   ED Course  Procedures (including critical care time) DIAGNOSTIC STUDIES: Oxygen Saturation is 98% on RA, nl by my interpretation.    COORDINATION OF CARE: 8:35 PM Discussed treatment plan with pt at bedside and pt agreed to plan.   Labs Review Labs Reviewed - No data to display  Imaging Review No results found.   I have personally reviewed and evaluated these images and lab results as part of my medical decision-making.   EKG Interpretation None      MDM   Final diagnoses:  Corneal abrasion,  right, initial encounter    59 y/o male presents to the ED for foreign body sensation in the R eye with clear tearing discharge, onset yesterday. Patient with evidence of corneal abrasion. No distinct foreign body or rust ring identified. Visual acuity and IOP intact. No concern for globe rupture. No proptosis or hyphema. Patient to f/u with Dr. Talbert Forest at [facility] tomorrow; case discussed with Dr. Talbert Forest prior to discharge. Will start on TID erythromycin ointment. Return precautions discussed and provided. Patient agreeable to plan with no unaddressed concerns. Patient discharged in good condition.  I personally performed the services described in this documentation, which was scribed in my presence. The recorded information has been reviewed  and is accurate.   Filed Vitals:   02/08/15 2009  BP: 167/75  Pulse: 71  Temp: 98.4 F (36.9 C)  TempSrc: Oral  Resp: 16  SpO2: 98%      Antonietta Breach, PA-C 02/08/15 [place], DO 02/09/15 0009

## 2015-03-05 ENCOUNTER — Ambulatory Visit (AMBULATORY_SURGERY_CENTER): Payer: Self-pay | Admitting: *Deleted

## 2015-03-05 VITALS — Ht 68.0 in | Wt 241.2 lb

## 2015-03-05 DIAGNOSIS — Z1211 Encounter for screening for malignant neoplasm of colon: Secondary | ICD-10-CM

## 2015-03-05 MED ORDER — NA SULFATE-K SULFATE-MG SULF 17.5-3.13-1.6 GM/177ML PO SOLN: 1.0000 | Freq: Once | ORAL | Status: DC

## 2015-03-05 NOTE — Progress Notes (Signed)
No egg or soy allergy Pt states he has hx of post op nausea/vomiting . He needs his glasses post op to prevent this. No home 02 use No diet pills emmi declined

## 2015-03-17 ENCOUNTER — Ambulatory Visit (AMBULATORY_SURGERY_CENTER): Payer: Managed Care, Other (non HMO) | Admitting: Gastroenterology

## 2015-03-17 ENCOUNTER — Encounter: Payer: Self-pay | Admitting: Gastroenterology

## 2015-03-17 VITALS — BP 126/61 | HR 63 | Temp 98.6°F | Resp 21 | Ht 68.0 in | Wt 241.2 lb

## 2015-03-17 DIAGNOSIS — D129 Benign neoplasm of anus and anal canal: Secondary | ICD-10-CM

## 2015-03-17 DIAGNOSIS — D122 Benign neoplasm of ascending colon: Secondary | ICD-10-CM

## 2015-03-17 DIAGNOSIS — D128 Benign neoplasm of rectum: Secondary | ICD-10-CM | POA: Diagnosis not present

## 2015-03-17 DIAGNOSIS — Z1211 Encounter for screening for malignant neoplasm of colon: Secondary | ICD-10-CM

## 2015-03-17 MED ORDER — SODIUM CHLORIDE 0.9 % IV SOLN: 500.0000 mL | INTRAVENOUS | Status: DC

## 2015-03-17 NOTE — Op Note (Signed)
[place]  [address] [place], [address]   COLONOSCOPY PROCEDURE REPORT  PATIENT: [name], [name]  MR#: [id number] BIRTHDATE: May 14, 1956 , 73  yrs. old GENDER: male ENDOSCOPIST: Ladene Artist, MD, [facility] REFERRED BY:  Eldridge Abrahams, NP PROCEDURE DATE:  03/17/2015 PROCEDURE:   Colonoscopy, screening and Colonoscopy with biopsy First Screening Colonoscopy - Avg.  risk and is 50 yrs.  old or older Yes.  Prior Negative Screening - Now for repeat screening. N/A  History of Adenoma - Now for follow-up colonoscopy & has been > or = to 3 yrs.  N/A  Polyps removed today? Yes ASA CLASS:   Class II INDICATIONS:Screening for colonic neoplasia and Colorectal Neoplasm Risk Assessment for this procedure is average risk. MEDICATIONS: Monitored anesthesia care and Propofol 200 mg IV DESCRIPTION OF PROCEDURE:   After the risks benefits and alternatives of the procedure were thoroughly explained, informed consent was obtained.  The digital rectal exam revealed no abnormalities of the rectum.   The LB [id number]-H190 [id number]  endoscope was introduced through the anus and advanced to the cecum, which was identified by both the appendix and ileocecal valve. No adverse events experienced.   The quality of the prep was good.  (Suprep was used)  The instrument was then slowly withdrawn as the colon was fully examined. Estimated blood loss is zero unless otherwise noted in this procedure report.    COLON FINDINGS: Two sessile polyps measuring 4-5 mm in size were found in the rectum and ascending colon.  Polypectomies were performed with cold forceps.  The resection was complete, the polyp tissue was completely retrieved and sent to histology.   There was mild diverticulosis noted in the transverse colon.   There was moderate diverticulosis noted in the sigmoid colon and descending colon with associated luminal narrowing and muscular hypertrophy. The examination was otherwise normal.   Retroflexed views revealed no abnormalities. The time to cecum = 2.1 Withdrawal time = 12.6 The scope was withdrawn and the procedure completed. COMPLICATIONS: There were no immediate complications.  ENDOSCOPIC IMPRESSION: 1.   Two sessile polyps in the rectum and ascending colon; polypectomies performed with cold forceps 2.   Mild diverticulosis in the transverse colon 3.   Moderate diverticulosis in the sigmoid colon and descending colon  RECOMMENDATIONS: 1.  Await pathology results 2.  High fiber diet with liberal fluid intake. 3.  Repeat colonoscopy in 5 years if polyp(s) adenomatous; otherwise 10 years  eSigned:  Ladene Artist, MD, [facility] 03/17/2015 11:33 AM

## 2015-03-17 NOTE — Progress Notes (Signed)
Report to PACU, RN, vss, BBS= Clear.  

## 2015-03-17 NOTE — Patient Instructions (Signed)
YOU HAD AN ENDOSCOPIC PROCEDURE TODAY AT THE [facility]:   Refer to the procedure report that was given to you for any specific questions about what was found during the examination.  If the procedure report does not answer your questions, please call your gastroenterologist to clarify.  If you requested that your care partner not be given the details of your procedure findings, then the procedure report has been included in a sealed envelope for you to review at your convenience later.  YOU SHOULD EXPECT: Some feelings of bloating in the abdomen. Passage of more gas than usual.  Walking can help get rid of the air that was put into your GI tract during the procedure and reduce the bloating. If you had a lower endoscopy (such as a colonoscopy or flexible sigmoidoscopy) you may notice spotting of blood in your stool or on the toilet paper. If you underwent a bowel prep for your procedure, you may not have a normal bowel movement for a few days.  Please Note:  You might notice some irritation and congestion in your nose or some drainage.  This is from the oxygen used during your procedure.  There is no need for concern and it should clear up in a day or so.  SYMPTOMS TO REPORT IMMEDIATELY:   Following lower endoscopy (colonoscopy or flexible sigmoidoscopy):  Excessive amounts of blood in the stool  Significant tenderness or worsening of abdominal pains  Swelling of the abdomen that is new, acute  Fever of 100F or higher    For urgent or emergent issues, a gastroenterologist can be reached at any hour by calling [phone].   DIET: Your first meal following the procedure should be a small meal and then it is ok to progress to your normal diet. Heavy or fried foods are harder to digest and may make you feel nauseous or bloated.  Likewise, meals heavy in dairy and vegetables can increase bloating.  Drink plenty of fluids but you should avoid alcoholic beverages for 24  hours.  ACTIVITY:  You should plan to take it easy for the rest of today and you should NOT DRIVE or use heavy machinery until tomorrow (because of the sedation medicines used during the test).    FOLLOW UP: Our staff will call the number listed on your records the next business day following your procedure to check on you and address any questions or concerns that you may have regarding the information given to you following your procedure. If we do not reach you, we will leave a message.  However, if you are feeling well and you are not experiencing any problems, there is no need to return our call.  We will assume that you have returned to your regular daily activities without incident.  If any biopsies were taken you will be contacted by phone or by letter within the next 1-3 weeks.  Please call us at [phone] if you have not heard about the biopsies in 3 weeks.    SIGNATURES/CONFIDENTIALITY: You and/or your care partner have signed paperwork which will be entered into your electronic medical record.  These signatures attest to the fact that that the information above on your After Visit Summary has been reviewed and is understood.  Full responsibility of the confidentiality of this discharge information lies with you and/or your care-partner.   Resume medications. Information given on polyps,diverticulosis and high fiber diet. 

## 2015-03-17 NOTE — Progress Notes (Signed)
Called to room to assist during endoscopic procedure.  Patient ID and intended procedure confirmed with present staff. Received instructions for my participation in the procedure from the performing physician.  

## 2015-03-18 ENCOUNTER — Telehealth: Payer: Self-pay

## 2015-03-18 NOTE — Telephone Encounter (Signed)
  Follow up Call-  Call back number 03/17/2015  Post procedure Call Back phone  # [phone]  Permission to leave phone message Yes     Patient questions:  Do you have a fever, pain , or abdominal swelling? No. Pain Score  0 *  Have you tolerated food without any problems? Yes.    Have you been able to return to your normal activities? Yes.    Do you have any questions about your discharge instructions: Diet   No. Medications  No. Follow up visit  No.  Do you have questions or concerns about your Care? No.  Actions: * If pain score is 4 or above: No action needed, pain <4.  Pt stated, "I want to thank you for the great care I received".  Pt asked to thank [facility] Monday too. No problems per the pt. maw

## 2015-03-23 ENCOUNTER — Ambulatory Visit: Payer: Managed Care, Other (non HMO) | Admitting: Dietician

## 2015-03-24 ENCOUNTER — Encounter: Payer: Self-pay | Admitting: Gastroenterology

## 2015-07-28 ENCOUNTER — Encounter (HOSPITAL_COMMUNITY): Payer: Self-pay | Admitting: Emergency Medicine

## 2015-07-28 ENCOUNTER — Emergency Department (HOSPITAL_COMMUNITY): Payer: Managed Care, Other (non HMO)

## 2015-07-28 ENCOUNTER — Emergency Department (HOSPITAL_COMMUNITY)
Admission: EM | Admit: 2015-07-28 | Discharge: 2015-07-28 | Disposition: A | Discharge status: Home / Self Care | Payer: Managed Care, Other (non HMO) | Attending: Emergency Medicine | Admitting: Emergency Medicine

## 2015-07-28 DIAGNOSIS — E785 Hyperlipidemia, unspecified: Secondary | ICD-10-CM | POA: Insufficient documentation

## 2015-07-28 DIAGNOSIS — F329 Major depressive disorder, single episode, unspecified: Secondary | ICD-10-CM | POA: Diagnosis not present

## 2015-07-28 DIAGNOSIS — Z87828 Personal history of other (healed) physical injury and trauma: Secondary | ICD-10-CM | POA: Insufficient documentation

## 2015-07-28 DIAGNOSIS — Z7951 Long term (current) use of inhaled steroids: Secondary | ICD-10-CM | POA: Insufficient documentation

## 2015-07-28 DIAGNOSIS — H269 Unspecified cataract: Secondary | ICD-10-CM | POA: Insufficient documentation

## 2015-07-28 DIAGNOSIS — F419 Anxiety disorder, unspecified: Secondary | ICD-10-CM | POA: Diagnosis not present

## 2015-07-28 DIAGNOSIS — Z7982 Long term (current) use of aspirin: Secondary | ICD-10-CM | POA: Insufficient documentation

## 2015-07-28 DIAGNOSIS — Z8739 Personal history of other diseases of the musculoskeletal system and connective tissue: Secondary | ICD-10-CM | POA: Insufficient documentation

## 2015-07-28 DIAGNOSIS — G459 Transient cerebral ischemic attack, unspecified: Secondary | ICD-10-CM

## 2015-07-28 DIAGNOSIS — Z872 Personal history of diseases of the skin and subcutaneous tissue: Secondary | ICD-10-CM | POA: Insufficient documentation

## 2015-07-28 DIAGNOSIS — Z79899 Other long term (current) drug therapy: Secondary | ICD-10-CM | POA: Insufficient documentation

## 2015-07-28 DIAGNOSIS — Z87438 Personal history of other diseases of male genital organs: Secondary | ICD-10-CM | POA: Diagnosis not present

## 2015-07-28 DIAGNOSIS — I10 Essential (primary) hypertension: Secondary | ICD-10-CM | POA: Diagnosis not present

## 2015-07-28 DIAGNOSIS — R200 Anesthesia of skin: Secondary | ICD-10-CM | POA: Diagnosis present

## 2015-07-28 LAB — CBC WITH DIFFERENTIAL/PLATELET
BASOS ABS: 0.1 10*3/uL (ref 0.0–0.1)
BASOS PCT: 1 %
Eosinophils Absolute: 0.6 10*3/uL (ref 0.0–0.7)
Eosinophils Relative: 5 %
HEMATOCRIT: 44.7 % (ref 39.0–52.0)
HEMOGLOBIN: 15 g/dL (ref 13.0–17.0)
LYMPHS PCT: 25 %
Lymphs Abs: 2.6 10*3/uL (ref 0.7–4.0)
MCH: 29.2 pg (ref 26.0–34.0)
MCHC: 33.6 g/dL (ref 30.0–36.0)
MCV: 87.1 fL (ref 78.0–100.0)
MONO ABS: 1.1 10*3/uL — AB (ref 0.1–1.0)
MONOS PCT: 10 %
NEUTROS ABS: 6.2 10*3/uL (ref 1.7–7.7)
NEUTROS PCT: 59 %
Platelets: 364 10*3/uL (ref 150–400)
RBC: 5.13 MIL/uL (ref 4.22–5.81)
RDW: 13.5 % (ref 11.5–15.5)
WBC: 10.4 10*3/uL (ref 4.0–10.5)

## 2015-07-28 LAB — I-STAT CHEM 8, ED
BUN: 16 mg/dL (ref 6–20)
CREATININE: 0.8 mg/dL (ref 0.61–1.24)
Calcium, Ion: 1.08 mmol/L — ABNORMAL LOW (ref 1.13–1.30)
Chloride: 106 mmol/L (ref 101–111)
Glucose, Bld: 90 mg/dL (ref 65–99)
HEMATOCRIT: 44 % (ref 39.0–52.0)
HEMOGLOBIN: 15 g/dL (ref 13.0–17.0)
POTASSIUM: 4.2 mmol/L (ref 3.5–5.1)
Sodium: 142 mmol/L (ref 135–145)
TCO2: 26 mmol/L (ref 0–100)

## 2015-07-28 NOTE — ED Notes (Addendum)
Pt states he has a hx of migraines and Monday he experienced the onset of a migraine. It became "worse than normal" and his fingers in both hands and his lips became tingly. Pt is alert and oriented x 4. Pt has an unremarkable neuro exam and has equal perception and strength bilaterally. Pt states he sees "squiglies" around the periphery of his vision. He states this is how his migraines normally are.

## 2015-07-28 NOTE — ED Provider Notes (Signed)
CSN: [id number]     Arrival date & time 07/28/15  0103 History  By signing my name below, I, [name], attest that this documentation has been prepared under the direction and in the presence of Varney Biles, MD. Electronically Signed: Soijett Hoover, ED Scribe. 07/28/2015. 3:46 AM.   Chief Complaint  Patient presents with  . Migraine  . Numbness      The history is provided by the patient. No language interpreter was used.    HPI Comments: [name] is a 60 y.o. male with a medical hx of HTN who presents to the Emergency Department complaining of intermittent, throbbing, right sided temporal HA onset 11 AM yesterday. Pt states that he was asleep when his HA began and he had "squiggly" lines to his bilateral eyes with numbness to his lips and bilateral hands. Pt states that those symptoms lasted 30 minutes before they resolved with the HA occurring following. Pt notes that his last HA was 1 year ago with similar symptoms, with confusion being new with this HA. He reports that his HA is worsened with coughing/sneezing and alleviated with rest. He notes that this HA is similar to HA that he has had in the past. Pt denies any head trauma, heavy coughing and the pain is actually a lot better.   He states that after the headache and associated typical symptoms started, he had mild confusion x 6 PM yesterday lasting 10 minutes. Pt states that his confusion, feels as if he is not thinking clearly, but the pt was A&Ox3. Pt states that he was watching tv when he was unable to recall who the actors were on the tv show that he normally watches. He states that he has tried Rx 800 mg ibuprofen for the relief for his symptoms. He denies any other symptoms. Denies allergies to any medications. Denies smoking cigarettes, substance abuse, or alcohol use. Pt has a PCP and he has an appointment with his PCP this week.     Past Medical History  Diagnosis Date  . Chest pain, atypical     12/2003:  negative  cardiolyte  . HTN (hypertension)   . HLD (hyperlipidemia)   . Depression   . Hx of tear of ACL (anterior cruciate ligament)     right  . History of meniscal tear     bilateral  . Erectile dysfunction   . Insomnia   . Eczema   . Olecranon bursitis of left elbow 10/2009    s/p I&D by Dr Maxie Better, initially assessed by Dr. Nori Riis   . Allergy   . Anxiety   . Cataract   . Prediabetes    Past Surgical History  Procedure Laterality Date  . Skin tag removal      11 removed  . Knee arthroscopy    . Incise and drain abcess      L elbow due to cellulitis/bursitis  . Inner ear surgery     Family History  Problem Relation Age of Onset  . Colon cancer Neg Hx   . Rectal cancer Neg Hx   . Stomach cancer Neg Hx    Social History  Substance Use Topics  . Smoking status: Never Smoker   . Smokeless tobacco: Never Used  . Alcohol Use: No    Review of Systems  A complete 10 system review of systems was obtained and all systems are negative except as noted in the HPI and PMH.   Allergies  Review of patient's allergies indicates  no known allergies.  Home Medications   Prior to Admission medications   Medication Sig Start Date End Date Taking? Authorizing Provider  amLODipine (NORVASC) 10 MG tablet Take 1 tablet (10 mg total) by mouth daily. 10/08/14  Yes Davonna Belling, MD  aspirin 81 MG chewable tablet Chew 81 mg by mouth daily.     Yes Historical Provider, MD  atorvastatin (LIPITOR) 10 MG tablet Take 10 mg by mouth daily at 6 PM.  12/01/14 12/01/15 Yes Historical Provider, MD  escitalopram (LEXAPRO) 10 MG tablet Take 10 mg by mouth daily.  11/29/14  Yes Historical Provider, MD  fluticasone (FLONASE) 50 MCG/ACT nasal spray Place 2 sprays into the nose daily. Patient taking differently: Place 2 sprays into the nose daily as needed for allergies.  09/07/12  Yes Cleatrice Burke, PA-C  ibuprofen (ADVIL,MOTRIN) 800 MG tablet Take 800 mg by mouth every 8 (eight) hours as needed for pain. Reported on  07/28/2015   Yes Historical Provider, MD  lisinopril (PRINIVIL,ZESTRIL) 20 MG tablet Take 2 tablets (40 mg total) by mouth every morning. 10/08/14  Yes Davonna Belling, MD  metoprolol (LOPRESSOR) 50 MG tablet Take 1 tablet (50 mg total) by mouth 2 (two) times daily. 10/08/14  Yes Davonna Belling, MD  erythromycin ophthalmic ointment Place 1 application into the right eye 3 (three) times daily. Place 1/2 inch ribbon of ointment in the affected eye 3 times a day Patient not taking: Reported on 03/05/2015 02/08/15   Antonietta Breach, PA-C  Na Sulfate-K Sulfate-Mg Sulf (SUPREP BOWEL PREP) SOLN Take 1 kit by mouth once. suprep as directed. No substitutions Patient not taking: Reported on 07/28/2015 03/05/15   Ladene Artist, MD  silver sulfADIAZINE (SILVADENE) 1 % cream Apply 1 application topically daily. Patient not taking: Reported on 03/05/2015 09/21/14   Charlann Lange, PA-C   BP 145/78 mmHg  Pulse 60  Temp(Src) 98.5 F (36.9 C) (Oral)  Resp 18  Ht _0  (1.727 m)  Wt 230 lb (104.327 kg)  BMI 34.98 kg/m2  SpO2 97% Physical Exam  Constitutional: He is oriented to person, place, and time. He appears well-developed and well-nourished. No distress.  HENT:  Head: Normocephalic and atraumatic.  Eyes: EOM are normal.  Neck: Neck supple.  Cardiovascular: Normal rate, regular rhythm and normal heart sounds.  Exam reveals no gallop and no friction rub.   No murmur heard. Pulmonary/Chest: Effort normal and breath sounds normal. No respiratory distress. He has no wheezes. He has no rales.  Lungs are clear to auscultation  Abdominal: Soft. There is no tenderness.  Musculoskeletal: Normal range of motion.  Upper and lower extremity strength is 4/5  Neurological: He is alert and oriented to person, place, and time. No cranial nerve deficit.  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 Cerebellar exam reveals no dysmetria. Gross sensory exam bilaterally upper and lower extremity is  equal bilaterally.  Skin: Skin is warm and dry.  Psychiatric: He has a normal mood and affect. His behavior is normal.  Nursing note and vitals reviewed.   ED Course  Procedures (including critical care time) DIAGNOSTIC STUDIES: Oxygen Saturation is 100% on RA, nl by my interpretation.    COORDINATION OF CARE: 3:46 AM Discussed treatment plan with pt at bedside and pt agreed to plan.    Labs Review Labs Reviewed  CBC WITH DIFFERENTIAL/PLATELET - Abnormal; Notable for the following:    Monocytes Absolute 1.1 (*)    All other components within  normal limits  I-STAT CHEM 8, ED - Abnormal; Notable for the following:    Calcium, Ion 1.08 (*)    All other components within normal limits    Imaging Review Ct Head Wo Contrast  07/28/2015  CLINICAL DATA:  Acute onset of right-sided temporal headache and bilateral hand and lip numbness. Initial encounter. EXAM: CT HEAD WITHOUT CONTRAST TECHNIQUE: Contiguous axial images were obtained from the base of the skull through the vertex without intravenous contrast. COMPARISON:  None. FINDINGS: There is no evidence of acute infarction, mass lesion, or intra- or extra-axial hemorrhage on CT. Mild periventricular white matter change likely reflects small vessel ischemic microangiopathy. Small chronic lacunar infarcts are seen at the basal ganglia bilaterally. The posterior fossa, including the cerebellum, brainstem and fourth ventricle, is within normal limits. The third and lateral ventricles, and basal ganglia are unremarkable in appearance. The cerebral hemispheres are symmetric in appearance, with normal gray-white differentiation. No mass effect or midline shift is seen. There is no evidence of fracture; visualized osseous structures are unremarkable in appearance. The orbits are within normal limits. The patient is status post left-sided mastoidectomy. The paranasal sinuses and right mastoid air cells are well-aerated. No significant soft tissue  abnormalities are seen. IMPRESSION: 1. No acute intracranial pathology seen on CT. 2. Mild small vessel ischemic microangiopathy and small chronic lacunar infarcts at the basal ganglia bilaterally. Electronically Signed   By: Garald Balding M.D.   On: 07/28/2015 04:45   I have personally reviewed and evaluated these images and lab results as part of my medical decision-making.   EKG Interpretation None      MDM   Final diagnoses:  Transient cerebral ischemia, unspecified transient cerebral ischemia type    I personally performed the services described in this documentation, which was scribed in my presence. The recorded information has been reviewed and is accurate.  Pt comes in with cc of headaches and confusion. He reports hx of same type of headaches, with the same constitutionals in the past. He was concerned about the new confusion, that started several hours after the headache onset. Confusion is a bizarre event of amnesia that lasted for 10 minutes. TIA? He has no focal neuro complain and no focal neuro deficits. Headache has improved.   DDX includes: Primary headaches - including migrainous headaches, cluster headaches, tension headaches. ICH Carotid dissection Cavernous sinus thrombosis / dural thrombosis Tumor AV malformation Brain aneurysm Muscular headaches  Based on the hx of similar headache and non focal neuro exam - we dont think anything acute is going on. VSS and ENL, pt is non toxic and the headache is mild currently - all reassuring.  CT head, labs - if normal  -outpatient tia workup.  Varney Biles, MD 07/28/15 [phone]

## 2015-07-28 NOTE — Discharge Instructions (Signed)
We saw you in the ER for the event of confusion/amnesia. All the results in the ER are normal, labs and imaging. We are not sure what is causing your symptoms - we think a TIA could have been the reason. The workup in the ER is not complete, and is limited to screening for life threatening and emergent conditions only, so please see a primary care doctor for further evaluation. Your CT results are as below. We recommend that you get a TIA workup as an outpatient - but return to the ER immediately if the symptoms return (call [phone]). Please take aspirin daily.   FINDINGS: There is no evidence of acute infarction, mass lesion, or intra- or extra-axial hemorrhage on CT.  Mild periventricular white matter change likely reflects small vessel ischemic microangiopathy. Small chronic lacunar infarcts are seen at the basal ganglia bilaterally.  The posterior fossa, including the cerebellum, brainstem and fourth ventricle, is within normal limits. The third and lateral ventricles, and basal ganglia are unremarkable in appearance. The cerebral hemispheres are symmetric in appearance, with normal gray-white differentiation. No mass effect or midline shift is seen.  There is no evidence of fracture; visualized osseous structures are unremarkable in appearance. The orbits are within normal limits. The patient is status post left-sided mastoidectomy. The paranasal sinuses and right mastoid air cells are well-aerated. No significant soft tissue abnormalities are seen.  IMPRESSION: 1. No acute intracranial pathology seen on CT. 2. Mild small vessel ischemic microangiopathy and small chronic lacunar infarcts at the basal ganglia bilaterally.    Transient Ischemic Attack A transient ischemic attack (TIA) is a "warning stroke" that causes stroke-like symptoms. Unlike a stroke, a TIA does not cause permanent damage to the brain. The symptoms of a TIA can happen very fast and do not last long. It is  important to know the symptoms of a TIA and what to do. This can help prevent a major stroke or death. CAUSES  A TIA is caused by a temporary blockage in an artery in the brain or neck (carotid artery). The blockage does not allow the brain to get the blood supply it needs and can cause different symptoms. The blockage can be caused by either:  A blood clot.  Fatty buildup (plaque) in a neck or brain artery. RISK FACTORS  High blood pressure (hypertension).  High cholesterol.  Diabetes mellitus.  Heart disease.  The buildup of plaque in the blood vessels (peripheral artery disease or atherosclerosis).  The buildup of plaque in the blood vessels that provide blood and oxygen to the brain (carotid artery stenosis).  An abnormal heart rhythm (atrial fibrillation).  Obesity.  Using any tobacco products, including cigarettes, chewing tobacco, or electronic cigarettes.  Taking oral contraceptives, especially in combination with using tobacco.  Physical inactivity.  A diet high in fats, salt (sodium), and calories.  Excessive alcohol use.  Use of illegal drugs (especially cocaine and methamphetamine).  Being male.  Being African American.  Being over the age of 70 years.  Family history of stroke.  Previous history of blood clots, stroke, TIA, or heart attack.  Sickle cell disease. SIGNS AND SYMPTOMS  TIA symptoms are the same as a stroke but are temporary. These symptoms usually develop suddenly, or may be newly present upon waking from sleep:  Sudden weakness or numbness of the face, arm, or leg, especially on one side of the body.  Sudden trouble walking or difficulty moving arms or legs.  Sudden confusion.  Sudden personality changes.  Trouble speaking (aphasia) or understanding.  Difficulty swallowing.  Sudden trouble seeing in one or both eyes.  Double vision.  Dizziness.  Loss of balance or coordination.  Sudden severe headache with no known  cause.  Trouble reading or writing.  Loss of bowel or bladder control.  Loss of consciousness. DIAGNOSIS  Your health care provider may be able to determine the presence or absence of a TIA based on your symptoms, history, and physical exam. CT scan of the brain is usually performed to help identify a TIA. Other tests may include:  Electrocardiography (ECG).  Continuous heart monitoring.  Echocardiography.  Carotid ultrasonography.  MRI.  A scan of the brain circulation.  Blood tests. TREATMENT  Since the symptoms of TIA are the same as a stroke, it is important to seek treatment as soon as possible. You may need a medicine to dissolve a blood clot (thrombolytic) if that is the cause of the TIA. This medicine cannot be given if too much time has passed. Treatment may also include:   Rest, oxygen, fluids through an IV tube, and medicines to thin the blood (anticoagulants).  Measures will be taken to prevent short-term and long-term complications, including infection from breathing foreign material into the lungs (aspiration pneumonia), blood clots in the legs, and falls.  Procedures to either remove plaque in the carotid arteries or dilate carotid arteries that have narrowed due to plaque. Those procedures are:  Carotid endarterectomy.  Carotid angioplasty and stenting.  Medicines and diet may be used to address diabetes, high blood pressure, and other underlying risk factors. HOME CARE INSTRUCTIONS   Take medicines only as directed by your health care provider. Follow the directions carefully. Medicines may be used to control risk factors for a stroke. Be sure you understand all your medicine instructions.  You may be told to take aspirin or the anticoagulant warfarin. Warfarin needs to be taken exactly as instructed.  Taking too much or too little warfarin is dangerous. Too much warfarin increases the risk of bleeding. Too little warfarin continues to allow the risk for  blood clots. While taking warfarin, you will need to have regular blood tests to measure your blood clotting time. A PT blood test measures how long it takes for blood to clot. Your PT is used to calculate another value called an INR. Your PT and INR help your health care provider to adjust your dose of warfarin. The dose can change for many reasons. It is critically important that you take warfarin exactly as prescribed.  Many foods, especially foods high in vitamin K can interfere with warfarin and affect the PT and INR. Foods high in vitamin K include spinach, kale, broccoli, cabbage, collard and turnip greens, Brussels sprouts, peas, cauliflower, seaweed, and parsley, as well as beef and pork liver, green tea, and soybean oil. You should eat a consistent amount of foods high in vitamin K. Avoid major changes in your diet, or notify your health care provider before changing your diet. Arrange a visit with a dietitian to answer your questions.  Many medicines can interfere with warfarin and affect the PT and INR. You must tell your health care provider about any and all medicines you take; this includes all vitamins and supplements. Be especially cautious with aspirin and anti-inflammatory medicines. Do not take or discontinue any prescribed or over-the-counter medicine except on the advice of your health care provider or pharmacist.  Warfarin can have side effects, such as excessive bruising or bleeding. You will need  to hold pressure over cuts for longer than usual. Your health care provider or pharmacist will discuss other potential side effects.  Avoid sports or activities that may cause injury or bleeding.  Be careful when shaving, flossing your teeth, or handling sharp objects.  Alcohol can change the body's ability to handle warfarin. It is best to avoid alcoholic drinks or consume only very small amounts while taking warfarin. Notify your health care provider if you change your alcohol  intake.  Notify your dentist or other health care providers before procedures.  Eat a diet that includes 5 or more servings of fruits and vegetables each day. This may reduce the risk of stroke. Certain diets may be prescribed to address high blood pressure, high cholesterol, diabetes, or obesity.  A diet low in sodium, saturated fat, trans fat, and cholesterol is recommended to manage high blood pressure.  A diet low in saturated fat, trans fat, and cholesterol, and high in fiber may control cholesterol levels.  A controlled-carbohydrate, controlled-sugar diet is recommended to manage diabetes.  A reduced-calorie diet that is low in sodium, saturated fat, trans fat, and cholesterol is recommended to manage obesity.  Maintain a healthy weight.  Stay physically active. It is recommended that you get at least 30 minutes of activity on most or all days.  Do not use any tobacco products, including cigarettes, chewing tobacco, or electronic cigarettes. If you need help quitting, ask your health care provider.  Limit alcohol intake to no more than 1 drink per day for nonpregnant women and 2 drinks per day for men. One drink equals 12 ounces of beer, 5 ounces of wine, or 1 ounces of hard liquor.  Do not abuse drugs.  A safe home environment is important to reduce the risk of falls. Your health care provider may arrange for specialists to evaluate your home. Having grab bars in the bedroom and bathroom is often important. Your health care provider may arrange for equipment to be used at home, such as raised toilets and a seat for the shower.  Follow all instructions for follow-up with your health care provider. This is very important. This includes any referrals and lab tests. Proper follow-up can prevent a stroke or another TIA from occurring. PREVENTION  The risk of a TIA can be decreased by appropriately treating high blood pressure, high cholesterol, diabetes, heart disease, and obesity, and  by quitting smoking, limiting alcohol, and staying physically active. SEEK MEDICAL CARE IF:  You have personality changes.  You have difficulty swallowing.  You are seeing double.  You have dizziness.  You have a fever. SEEK IMMEDIATE MEDICAL CARE IF:  Any of the following symptoms may represent a serious problem that is an emergency. Do not wait to see if the symptoms will go away. Get medical help right away. Call your local emergency services (911 in [place] Do not drive yourself to the hospital.  You have sudden weakness or numbness of the face, arm, or leg, especially on one side of the body.  You have sudden trouble walking or difficulty moving arms or legs.  You have sudden confusion.  You have trouble speaking (aphasia) or understanding.  You have sudden trouble seeing in one or both eyes.  You have a loss of balance or coordination.  You have a sudden, severe headache with no known cause.  You have new chest pain or an irregular heartbeat.  You have a partial or total loss of consciousness. MAKE SURE YOU:   Understand  these instructions.  Will watch your condition.  Will get help right away if you are not doing well or get worse.   This information is not intended to replace advice given to you by your health care provider. Make sure you discuss any questions you have with your health care provider.   Document Released: 02/22/2005 Document Revised: 06/05/2014 Document Reviewed: 08/20/2013 Elsevier Interactive Patient Education [employer].

## 2015-07-29 DIAGNOSIS — Z8673 Personal history of transient ischemic attack (TIA), and cerebral infarction without residual deficits: Secondary | ICD-10-CM | POA: Insufficient documentation

## 2015-07-29 DIAGNOSIS — Z9181 History of falling: Secondary | ICD-10-CM | POA: Insufficient documentation

## 2015-09-14 ENCOUNTER — Emergency Department (HOSPITAL_COMMUNITY): Payer: Managed Care, Other (non HMO)

## 2015-09-14 ENCOUNTER — Encounter (HOSPITAL_COMMUNITY): Payer: Self-pay | Admitting: *Deleted

## 2015-09-14 ENCOUNTER — Observation Stay (HOSPITAL_COMMUNITY)
Admission: EM | Admit: 2015-09-14 | Discharge: 2015-09-17 | Disposition: A | Discharge status: Home / Self Care | Payer: Managed Care, Other (non HMO) | Attending: Internal Medicine | Admitting: Internal Medicine

## 2015-09-14 DIAGNOSIS — D72829 Elevated white blood cell count, unspecified: Secondary | ICD-10-CM | POA: Insufficient documentation

## 2015-09-14 DIAGNOSIS — Z7982 Long term (current) use of aspirin: Secondary | ICD-10-CM | POA: Diagnosis not present

## 2015-09-14 DIAGNOSIS — E119 Type 2 diabetes mellitus without complications: Secondary | ICD-10-CM | POA: Insufficient documentation

## 2015-09-14 DIAGNOSIS — Z79899 Other long term (current) drug therapy: Secondary | ICD-10-CM | POA: Diagnosis not present

## 2015-09-14 DIAGNOSIS — F419 Anxiety disorder, unspecified: Secondary | ICD-10-CM | POA: Diagnosis not present

## 2015-09-14 DIAGNOSIS — Z8673 Personal history of transient ischemic attack (TIA), and cerebral infarction without residual deficits: Secondary | ICD-10-CM | POA: Diagnosis not present

## 2015-09-14 DIAGNOSIS — G43109 Migraine with aura, not intractable, without status migrainosus: Secondary | ICD-10-CM

## 2015-09-14 DIAGNOSIS — F329 Major depressive disorder, single episode, unspecified: Secondary | ICD-10-CM | POA: Diagnosis present

## 2015-09-14 DIAGNOSIS — I6782 Cerebral ischemia: Secondary | ICD-10-CM | POA: Insufficient documentation

## 2015-09-14 DIAGNOSIS — G459 Transient cerebral ischemic attack, unspecified: Secondary | ICD-10-CM | POA: Diagnosis present

## 2015-09-14 DIAGNOSIS — G939 Disorder of brain, unspecified: Secondary | ICD-10-CM | POA: Insufficient documentation

## 2015-09-14 DIAGNOSIS — I10 Essential (primary) hypertension: Secondary | ICD-10-CM | POA: Diagnosis present

## 2015-09-14 DIAGNOSIS — R51 Headache: Secondary | ICD-10-CM | POA: Diagnosis present

## 2015-09-14 DIAGNOSIS — I729 Aneurysm of unspecified site: Secondary | ICD-10-CM

## 2015-09-14 DIAGNOSIS — E785 Hyperlipidemia, unspecified: Secondary | ICD-10-CM | POA: Insufficient documentation

## 2015-09-14 DIAGNOSIS — I671 Cerebral aneurysm, nonruptured: Principal | ICD-10-CM | POA: Insufficient documentation

## 2015-09-14 DIAGNOSIS — F32A Depression, unspecified: Secondary | ICD-10-CM | POA: Diagnosis present

## 2015-09-14 HISTORY — DX: Transient cerebral ischemic attack, unspecified: G45.9

## 2015-09-14 LAB — I-STAT CHEM 8, ED
BUN: 25 mg/dL — AB (ref 6–20)
CREATININE: 1.1 mg/dL (ref 0.61–1.24)
Calcium, Ion: 1.09 mmol/L — ABNORMAL LOW (ref 1.13–1.30)
Chloride: 101 mmol/L (ref 101–111)
Glucose, Bld: 215 mg/dL — ABNORMAL HIGH (ref 65–99)
HEMATOCRIT: 48 % (ref 39.0–52.0)
Hemoglobin: 16.3 g/dL (ref 13.0–17.0)
POTASSIUM: 3.5 mmol/L (ref 3.5–5.1)
Sodium: 139 mmol/L (ref 135–145)
TCO2: 24 mmol/L (ref 0–100)

## 2015-09-14 LAB — COMPREHENSIVE METABOLIC PANEL
ALBUMIN: 3.9 g/dL (ref 3.5–5.0)
ALT: 26 U/L (ref 17–63)
AST: 21 U/L (ref 15–41)
Alkaline Phosphatase: 74 U/L (ref 38–126)
Anion gap: 12 (ref 5–15)
BUN: 26 mg/dL — AB (ref 6–20)
CHLORIDE: 100 mmol/L — AB (ref 101–111)
CO2: 24 mmol/L (ref 22–32)
CREATININE: 1.17 mg/dL (ref 0.61–1.24)
Calcium: 8.9 mg/dL (ref 8.9–10.3)
GFR calc Af Amer: >60 mL/min (ref >60)
GFR calc non Af Amer: >60 mL/min (ref >60)
GLUCOSE: 222 mg/dL — AB (ref 65–99)
Potassium: 3.5 mmol/L (ref 3.5–5.1)
SODIUM: 136 mmol/L (ref 135–145)
Total Bilirubin: 0.7 mg/dL (ref 0.3–1.2)
Total Protein: 7.6 g/dL (ref 6.5–8.1)

## 2015-09-14 LAB — PROTIME-INR
INR: 1.1 (ref 0.00–1.49)
Prothrombin Time: 14 seconds (ref 11.6–15.2)

## 2015-09-14 LAB — DIFFERENTIAL
BASOS ABS: 0 10*3/uL (ref 0.0–0.1)
BASOS PCT: 0 %
Eosinophils Absolute: 0.4 10*3/uL (ref 0.0–0.7)
Eosinophils Relative: 3 %
Lymphocytes Relative: 19 %
Lymphs Abs: 2.3 10*3/uL (ref 0.7–4.0)
MONOS PCT: 3 %
Monocytes Absolute: 0.3 10*3/uL (ref 0.1–1.0)
NEUTROS ABS: 9 10*3/uL — AB (ref 1.7–7.7)
Neutrophils Relative %: 75 %

## 2015-09-14 LAB — I-STAT TROPONIN, ED: Troponin i, poc: 0.01 ng/mL (ref 0.00–0.08)

## 2015-09-14 LAB — CBC
HEMATOCRIT: 43.4 % (ref 39.0–52.0)
Hemoglobin: 15.1 g/dL (ref 13.0–17.0)
MCH: 29.2 pg (ref 26.0–34.0)
MCHC: 34.8 g/dL (ref 30.0–36.0)
MCV: 83.9 fL (ref 78.0–100.0)
PLATELETS: 377 10*3/uL (ref 150–400)
RBC: 5.17 MIL/uL (ref 4.22–5.81)
RDW: 13.5 % (ref 11.5–15.5)
WBC: 12.1 10*3/uL — AB (ref 4.0–10.5)

## 2015-09-14 LAB — CBG MONITORING, ED: Glucose-Capillary: 161 mg/dL — ABNORMAL HIGH (ref 65–99)

## 2015-09-14 LAB — MAGNESIUM: MAGNESIUM: 1.9 mg/dL (ref 1.7–2.4)

## 2015-09-14 LAB — PHOSPHORUS: PHOSPHORUS: 3.1 mg/dL (ref 2.5–4.6)

## 2015-09-14 LAB — APTT: APTT: 25 s (ref 24–37)

## 2015-09-14 MED ORDER — PROCHLORPERAZINE EDISYLATE 5 MG/ML IJ SOLN
10.0000 mg | Freq: Once | INTRAMUSCULAR | Status: AC
  Administered 2015-09-14: 10 mg via INTRAVENOUS
  Filled 2015-09-14: qty 2

## 2015-09-14 MED ORDER — DIPHENHYDRAMINE HCL 50 MG/ML IJ SOLN
25.0000 mg | Freq: Once | INTRAMUSCULAR | Status: AC
  Administered 2015-09-14: 25 mg via INTRAVENOUS
  Filled 2015-09-14: qty 1

## 2015-09-14 MED ORDER — SODIUM CHLORIDE 0.9 % IV BOLUS (SEPSIS)
1000.0000 mL | Freq: Once | INTRAVENOUS | Status: AC
  Administered 2015-09-14: 1000 mL via INTRAVENOUS

## 2015-09-14 NOTE — ED Notes (Addendum)
Pt states that he had a TIA in March; pt states that he began having a headache around 7pm and has some numbness to his mouth; pt states that these are similar to his previous TIA; pt denies visual changes at present; pt states that he had visual changes with previous TIA; pt c/o shortness of breath; pt states that he is scared and having anxiety over potentially having another TIA: pt ambulatory to triage with steady gait; no extremity weakness

## 2015-09-14 NOTE — ED Provider Notes (Signed)
CSN: [id number]     Arrival date & time 09/14/15  2016 History   First MD Initiated Contact with Patient 09/14/15 2028     Chief Complaint  Patient presents with  . TIA Symptoms      (Consider location/radiation/quality/duration/timing/severity/associated sxs/prior Treatment) HPI  Pt presenting with c/o headache as well as right sided lip and tongue numbness.  Pt states his symptoms began about 7pm- he states the numbness on his face has resolved but headache remains.  He states he was told when he had these symptoms in the past that it was a TIA.  States he has had carotid ultrasounds, echo done by his PMD but has not had an MRI.  No weakness, no changes in speech or vision.  Headache is throbbing and constant  Was gradual in onset.  He states he has a hx of migraines and the headache feels like his prior migraines.  There are no other associated systemic symptoms, there are no other alleviating or modifying factors.   Past Medical History  Diagnosis Date  . Chest pain, atypical     12/2003:  negative cardiolyte  . HTN (hypertension)   . HLD (hyperlipidemia)   . Depression   . Hx of tear of ACL (anterior cruciate ligament)     right  . History of meniscal tear     bilateral  . Erectile dysfunction   . Insomnia   . Eczema   . Olecranon bursitis of left elbow 10/2009    s/p I&D by Dr Maxie Better, initially assessed by Dr. Nori Riis   . Allergy   . Anxiety   . Cataract   . Prediabetes   . TIA (transient ischemic attack)    Past Surgical History  Procedure Laterality Date  . Skin tag removal      11 removed  . Knee arthroscopy    . Incise and drain abcess      L elbow due to cellulitis/bursitis  . Inner ear surgery     Family History  Problem Relation Age of Onset  . Colon cancer Neg Hx   . Rectal cancer Neg Hx   . Stomach cancer Neg Hx   . Stroke Mother   . Hypertension Mother   . Aneurysm Mother 76    Died of brain aneursym  . Heart failure Father   . Emphysema Father   .  Diabetes Mellitus II Sister    Social History  Substance Use Topics  . Smoking status: Never Smoker   . Smokeless tobacco: Never Used  . Alcohol Use: No    Review of Systems  ROS reviewed and all otherwise negative except for mentioned in HPI    Allergies  Review of patient's allergies indicates no known allergies.  Home Medications   Prior to Admission medications   Medication Sig Start Date End Date Taking? Authorizing Provider  amLODipine (NORVASC) 10 MG tablet Take 1 tablet (10 mg total) by mouth daily. 10/08/14  Yes Davonna Belling, MD  aspirin 81 MG chewable tablet Chew 81 mg by mouth daily.     Yes Historical Provider, MD  atorvastatin (LIPITOR) 10 MG tablet Take 10 mg by mouth daily at 6 PM.  12/01/14 12/01/15 Yes Historical Provider, MD  chlorthalidone (HYGROTON) 25 MG tablet Take 25 mg by mouth daily. 07/29/15 10/27/15 Yes Historical Provider, MD  escitalopram (LEXAPRO) 10 MG tablet Take 10 mg by mouth daily.  11/29/14  Yes Historical Provider, MD  fluticasone (FLONASE) 50 MCG/ACT nasal spray Place 2  sprays into the nose daily. Patient taking differently: Place 2 sprays into the nose daily as needed for allergies.  09/07/12  Yes Cleatrice Burke, PA-C  ibuprofen (ADVIL,MOTRIN) 800 MG tablet Take 800 mg by mouth every 8 (eight) hours as needed for pain. Reported on 07/28/2015   Yes Historical Provider, MD  lisinopril (PRINIVIL,ZESTRIL) 20 MG tablet Take 2 tablets (40 mg total) by mouth every morning. 10/08/14  Yes Davonna Belling, MD  metoprolol (LOPRESSOR) 50 MG tablet Take 1 tablet (50 mg total) by mouth 2 (two) times daily. 10/08/14  Yes Davonna Belling, MD  erythromycin ophthalmic ointment Place 1 application into the right eye 3 (three) times daily. Place 1/2 inch ribbon of ointment in the affected eye 3 times a day Patient not taking: Reported on 03/05/2015 02/08/15   Antonietta Breach, PA-C  silver sulfADIAZINE (SILVADENE) 1 % cream Apply 1 application topically daily. Patient not taking:  Reported on 03/05/2015 09/21/14   Charlann Lange, PA-C   BP 102/59 mmHg  Pulse 59  Temp(Src) 97.9 F (36.6 C) (Oral)  Resp 16  Ht 5\' 8"  (1.727 m)  Wt 246 lb 4.1 oz (111.7 kg)  BMI 37.45 kg/m2  SpO2 97%  Vitals reviewed Physical Exam  Physical Examination: General appearance - alert, well appearing, and in no distress Mental status - alert, oriented to person, place, and time Eyes - pupils equal and reactive, extraocular eye movements intact Mouth - mucous membranes moist, pharynx normal without lesions Chest - clear to auscultation, no wheezes, rales or rhonchi, symmetric air entry Heart - normal rate, regular rhythm, normal S1, S2, no murmurs, rubs, clicks or gallops Abdomen - soft, nontender, nondistended, no masses or organomegaly Neurological - alert, oriented x 3, cranial nerves 2-12 tested and intact, strength 5/5 in extremities x 4, sensation intact Extremities - peripheral pulses normal, no pedal edema, no clubbing or cyanosis Skin - normal coloration and turgor, no rashes  ED Course  Procedures (including critical care time) Labs Review Labs Reviewed  SURGICAL PCR SCREEN - Abnormal; Notable for the following:    Staphylococcus aureus POSITIVE (*)    All other components within normal limits  CBC - Abnormal; Notable for the following:    WBC 12.1 (*)    All other components within normal limits  DIFFERENTIAL - Abnormal; Notable for the following:    Neutro Abs 9.0 (*)    All other components within normal limits  COMPREHENSIVE METABOLIC PANEL - Abnormal; Notable for the following:    Chloride 100 (*)    Glucose, Bld 222 (*)    BUN 26 (*)    All other components within normal limits  SEDIMENTATION RATE - Abnormal; Notable for the following:    Sed Rate 27 (*)    All other components within normal limits  GLUCOSE, CAPILLARY - Abnormal; Notable for the following:    Glucose-Capillary 123 (*)    All other components within normal limits  GLUCOSE, CAPILLARY - Abnormal;  Notable for the following:    Glucose-Capillary 107 (*)    All other components within normal limits  GLUCOSE, CAPILLARY - Abnormal; Notable for the following:    Glucose-Capillary 133 (*)    All other components within normal limits  CBG MONITORING, ED - Abnormal; Notable for the following:    Glucose-Capillary 161 (*)    All other components within normal limits  I-STAT CHEM 8, ED - Abnormal; Notable for the following:    BUN 25 (*)    Glucose, Bld 215 (*)  Calcium, Ion 1.09 (*)    All other components within normal limits  PROTIME-INR  APTT  MAGNESIUM  PHOSPHORUS  GLUCOSE, CAPILLARY  GLUCOSE, CAPILLARY  GLUCOSE, CAPILLARY  GLUCOSE, CAPILLARY  I-STAT TROPOININ, ED    Imaging Review Dg Chest 2 View  09/15/2015  CLINICAL DATA:  Headache for 2 days.  Possible TIA. EXAM: CHEST  2 VIEW COMPARISON:  11/19/2009 FINDINGS: The heart size and mediastinal contours are within normal limits. Both lungs are clear. The visualized skeletal structures are unremarkable. IMPRESSION: No active cardiopulmonary disease. Electronically Signed   By: Lucienne Capers M.D.   On: 09/15/2015 02:41   Mr Brain Wo Contrast  09/15/2015  CLINICAL DATA:  Frontal headache beginning at 7 p.m. Mouth numbness. TIA 1 month ago. History of hypertension, hyperlipidemia. EXAM: MRI HEAD WITHOUT CONTRAST MRA HEAD WITHOUT CONTRAST TECHNIQUE: Multiplanar, multiecho pulse sequences of the brain and surrounding structures were obtained without intravenous contrast. Angiographic images of the head were obtained using MRA technique without contrast. COMPARISON:  CT head September 13, 2015 FINDINGS: MRI HEAD FINDINGS INTRACRANIAL CONTENTS: No reduced diffusion to suggest acute ischemia. Subcentimeter focus of susceptibility artifact LEFT caudate head without additional signal abnormality, possible cavernoma. Ventricles and sulci are normal for patient's age. Patchy pontine and scattered subcentimeter supratentorial white matter T2  hyperintensities, without midline shift or mass effect. Prominent supra and infratentorial perivascular spaces. No abnormal extra-axial fluid collections. Focal low T2, intermediate T1 focal signal along the anterior interhemispheric fissure corresponding to CT abnormality. ORBITS: The included ocular globes and orbital contents are non-suspicious. SINUSES: Mild paranasal sinus mucosal thickening. Bilateral maxillary sinus mucosal retention cyst. Mastoid air cells are well aerated. SKULL/SOFT TISSUES: No abnormal sellar expansion. No suspicious calvarial bone marrow signal. Craniocervical junction maintained. MRA HEAD FINDINGS Moderately motion degraded examination. Anterior circulation: Normal flow related enhancement of the included cervical, petrous, cavernous and supraclinoid internal carotid arteries. Patent anterior communicating artery. 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 Probable retrograde flow into RIGHT A3 segment. Poor visualized RIGHT A2 segment. No large vessel occlusion, high-grade stenosis, abnormal luminal irregularity. Posterior circulation: LEFT vertebral artery is dominant. Basilar artery is patent, with normal flow related enhancement of the main branch vessels. Robust RIGHT posterior communicating artery. Normal flow related enhancement of the posterior cerebral arteries. No large vessel occlusion, high-grade stenosis, abnormal luminal irregularity, aneurysm. IMPRESSION: MRI HEAD: No acute intracranial process. Susceptibility artifact LEFT caudate head could represent a cavernoma. Moderate chronic small vessel ischemic disease. MRA HEAD: Moderately motion degraded examination. 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 Poorly visualized RIGHT A2 segment. Recommend CTA head for further characterization. No emergent large vessel occlusion or high-grade stenosis though motion limits assessment. Electronically Signed   By:  Elon Alas M.D.   On: 09/15/2015 02:56   Mr Jodene Nam Head/brain Wo Cm  09/15/2015  CLINICAL DATA:  Frontal headache beginning at 7 p.m. Mouth numbness. TIA 1 month ago. History of hypertension, hyperlipidemia. EXAM: MRI HEAD WITHOUT CONTRAST MRA HEAD WITHOUT CONTRAST TECHNIQUE: Multiplanar, multiecho pulse sequences of the brain and surrounding structures were obtained without intravenous contrast. Angiographic images of the head were obtained using MRA technique without contrast. COMPARISON:  CT head September 13, 2015 FINDINGS: MRI HEAD FINDINGS INTRACRANIAL CONTENTS: No reduced diffusion to suggest acute ischemia. Subcentimeter focus of susceptibility artifact LEFT caudate head without additional signal abnormality, possible cavernoma. Ventricles and sulci are normal for patient's age. Patchy pontine and scattered  subcentimeter supratentorial white matter T2 hyperintensities, without midline shift or mass effect. Prominent supra and infratentorial perivascular spaces. No abnormal extra-axial fluid collections. Focal low T2, intermediate T1 focal signal along the anterior interhemispheric fissure corresponding to CT abnormality. ORBITS: The included ocular globes and orbital contents are non-suspicious. SINUSES: Mild paranasal sinus mucosal thickening. Bilateral maxillary sinus mucosal retention cyst. Mastoid air cells are well aerated. SKULL/SOFT TISSUES: No abnormal sellar expansion. No suspicious calvarial bone marrow signal. Craniocervical junction maintained. MRA HEAD FINDINGS Moderately motion degraded examination. Anterior circulation: Normal flow related enhancement of the included cervical, petrous, cavernous and supraclinoid internal carotid arteries. Patent anterior communicating artery. 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 Probable retrograde flow into RIGHT A3 segment. Poor visualized RIGHT A2 segment. No large vessel occlusion, high-grade stenosis,  abnormal luminal irregularity. Posterior circulation: LEFT vertebral artery is dominant. Basilar artery is patent, with normal flow related enhancement of the main branch vessels. Robust RIGHT posterior communicating artery. Normal flow related enhancement of the posterior cerebral arteries. No large vessel occlusion, high-grade stenosis, abnormal luminal irregularity, aneurysm. IMPRESSION: MRI HEAD: No acute intracranial process. Susceptibility artifact LEFT caudate head could represent a cavernoma. Moderate chronic small vessel ischemic disease. MRA HEAD: Moderately motion degraded examination. 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 Poorly visualized RIGHT A2 segment. Recommend CTA head for further characterization. No emergent large vessel occlusion or high-grade stenosis though motion limits assessment. Electronically Signed   By: Elon Alas M.D.   On: 09/15/2015 02:56   I have personally reviewed and evaluated these images and lab results as part of my medical decision-making.   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  Does not recommend TPA- does not feel this is stroke related    9:58 PM d/w Neurologist, he recommends [facility]ist admission at [facility], will need MR brain with and without as well as MR angiography to complete the workup for his sympotms and CT scan finding.    10:32 PM d/w hospitalist, Dr. Olevia Bowens, pt will be admitted to [facility] to hospitalist service.    Alfonzo Beers, MD 09/16/15 2125

## 2015-09-14 NOTE — H&P (Signed)
[facility] History and Physical  DAVIED HAISLIP [id number] DOB: May 20, 1956 DOA: 09/14/2015  Referring physician: Alfonzo Beers, M.D. PCP: Berkley Harvey, NP   Chief Complaint: Facial numbness.   HPI: [name] is a 60 y.o. male with a past medical history of depression, anxiety, hyperlipidemia, hypertension, prediabetes, TIA last month who comes emergency department with complaints of facial numbness this evening.  Per patient, around 7 PM he started having a frontal headache associated with numbness in his mouth. He denies visual changes, weakness or numbness of his extremities, unsteady gait, slurred speech or language comprehension difficulty. He is states that his symptoms were similar to when he had a TIA last month, as said that this time he did not have visual changes.   He also complains about being initially showed a breath in the emergency department, but stated that he was very anxious initially. He is currently in no acute distress and denies any shortness of breath at this time. Workup shows a CT scan of the head with a hyperdense 1.3 x 0.6 cm lesions along the anterior falx and MRI with MRA was suggested. We are transferring the patient to the neuro telemetry floor at [facility] for further evaluation. The patient requested to have sedation  prior to MRI.   Review of Systems:  Constitutional:  No weight loss, night sweats, Fevers, chills, fatigue.  HEENT:  No headaches, Difficulty swallowing,Tooth/dental problems,Sore throat,  No sneezing, itching, ear ache, nasal congestion, post nasal drip,  Cardio-vascular:  No chest pain, Orthopnea, PND, swelling in lower extremities, anasarca, dizziness, palpitations  GI:  Positive nausea earlier with headache. No heartburn, indigestion, abdominal pain vomiting, diarrhea, change in bowel habits, loss of appetite  Resp:  No shortness of breath with exertion or at rest. No excess mucus, no productive cough, No  non-productive cough, No coughing up of blood.No change in color of mucus.No wheezing.No chest wall deformity  Skin:  no rash or lesions.  GU:  no dysuria, change in color of urine, no urgency or frequency. No flank pain.  Musculoskeletal:  No joint pain or swelling. No decreased range of motion. No back pain.  Psych:  History of depression or anxiety. No change in mood or affect.  No memory loss.  Neuro:  As above mentioned. Past Medical History  Diagnosis Date  . Chest pain, atypical     12/2003:  negative cardiolyte  . HTN (hypertension)   . HLD (hyperlipidemia)   . Depression   . Hx of tear of ACL (anterior cruciate ligament)     right  . History of meniscal tear     bilateral  . Erectile dysfunction   . Insomnia   . Eczema   . Olecranon bursitis of left elbow 10/2009    s/p I&D by Dr Maxie Better, initially assessed by Dr. Nori Riis   . Allergy   . Anxiety   . Cataract   . Prediabetes   . TIA (transient ischemic attack)    Past Surgical History  Procedure Laterality Date  . Skin tag removal      11 removed  . Knee arthroscopy    . Incise and drain abcess      L elbow due to cellulitis/bursitis  . Inner ear surgery     Social History:  reports that he has never smoked. He has never used smokeless tobacco. He reports that he does not drink alcohol or use illicit drugs.  No Known Allergies  Family History  Problem Relation Age of Onset  . Colon cancer Neg Hx   . Rectal cancer Neg Hx   . Stomach cancer Neg Hx   . Stroke Mother   . Hypertension Mother   . Aneurysm Mother 45    Died of brain aneursym  . Heart failure Father   . Emphysema Father   . Diabetes Mellitus II Sister     Prior to Admission medications   Medication Sig Start Date End Date Taking? Authorizing Provider  amLODipine (NORVASC) 10 MG tablet Take 1 tablet (10 mg total) by mouth daily. 10/08/14  Yes Davonna Belling, MD  aspirin 81 MG chewable tablet Chew 81 mg by mouth daily.     Yes Historical  Provider, MD  atorvastatin (LIPITOR) 10 MG tablet Take 10 mg by mouth daily at 6 PM.  12/01/14 12/01/15 Yes Historical Provider, MD  chlorthalidone (HYGROTON) 25 MG tablet Take 25 mg by mouth daily. 07/29/15 10/27/15 Yes Historical Provider, MD  escitalopram (LEXAPRO) 10 MG tablet Take 10 mg by mouth daily.  11/29/14  Yes Historical Provider, MD  fluticasone (FLONASE) 50 MCG/ACT nasal spray Place 2 sprays into the nose daily. Patient taking differently: Place 2 sprays into the nose daily as needed for allergies.  09/07/12  Yes Cleatrice Burke, PA-C  ibuprofen (ADVIL,MOTRIN) 800 MG tablet Take 800 mg by mouth every 8 (eight) hours as needed for pain. Reported on 07/28/2015   Yes Historical Provider, MD  lisinopril (PRINIVIL,ZESTRIL) 20 MG tablet Take 2 tablets (40 mg total) by mouth every morning. 10/08/14  Yes Davonna Belling, MD  metoprolol (LOPRESSOR) 50 MG tablet Take 1 tablet (50 mg total) by mouth 2 (two) times daily. 10/08/14  Yes Davonna Belling, MD  erythromycin ophthalmic ointment Place 1 application into the right eye 3 (three) times daily. Place 1/2 inch ribbon of ointment in the affected eye 3 times a day Patient not taking: Reported on 03/05/2015 02/08/15   Antonietta Breach, PA-C  silver sulfADIAZINE (SILVADENE) 1 % cream Apply 1 application topically daily. Patient not taking: Reported on 03/05/2015 09/21/14   Charlann Lange, PA-C   Physical Exam: Filed Vitals:   09/14/15 2030 09/14/15 2200 09/14/15 2330 09/15/15 0018  BP: 170/98 158/83 136/75 133/78  Pulse: 93 82 73 69  Temp:    97.6 F (36.4 C)  TempSrc:    Oral  Resp: 17 14 26 20   Height:    5\' 8"  (1.727 m)  Weight:    111.7 kg (246 lb 4.1 oz)  SpO2: 99% 98% 99% 97%    Wt Readings from Last 3 Encounters:  09/15/15 111.7 kg (246 lb 4.1 oz)  07/28/15 104.327 kg (230 lb)  03/17/15 109.408 kg (241 lb 3.2 oz)    General:  Appears calm and comfortable Eyes: PERRL, normal lids, irises & conjunctiva ENT: grossly normal hearing, lips &  tongue Neck: no LAD, masses or thyromegaly Cardiovascular: RRR, no m/r/g. No LE edema. Telemetry: SR, no arrhythmias  Respiratory: CTA bilaterally, no w/r/r. Normal respiratory effort. Abdomen: soft, ntnd Skin: no rash or induration seen on limited exam Musculoskeletal: grossly normal tone BUE/BLE Psychiatric: grossly normal mood and affect, speech fluent and appropriate Neurologic: Awake, alert, oriented 4, cranial nerves II-12 are intact, motor strength is 5 over 5 in all extremities, sensation is intact, nose to finger coordination, Romberg test and gait are within normal limits.           Labs on Admission:  Basic Metabolic Panel:  Recent Labs Lab 09/14/15 2049 09/14/15  2056  NA 136 139  K 3.5 3.5  CL 100* 101  CO2 24  --   GLUCOSE 222* 215*  BUN 26* 25*  CREATININE 1.17 1.10  CALCIUM 8.9  --   MG 1.9  --   PHOS 3.1  --    Liver Function Tests:  Recent Labs Lab 09/14/15 2049  AST 21  ALT 26  ALKPHOS 74  BILITOT 0.7  PROT 7.6  ALBUMIN 3.9   CBC:  Recent Labs Lab 09/14/15 2049 09/14/15 2056  WBC 12.1*  --   NEUTROABS 9.0*  --   HGB 15.1 16.3  HCT 43.4 48.0  MCV 83.9  --   PLT 377  --     CBG:  Recent Labs Lab 09/14/15 2210  GLUCAP 161*    Radiological Exams on Admission: Ct Head Wo Contrast  09/14/2015  CLINICAL DATA:  Transient ischemic attack in March. Headache starting 7 p.m. tonight with perioral numbness. Shortness of breath. Anxiety. EXAM: CT HEAD WITHOUT CONTRAST TECHNIQUE: Contiguous axial images were obtained from the base of the skull through the vertex without intravenous contrast. COMPARISON:  07/28/2015 FINDINGS: Along the medial left frontal cortex there is a suggestion of a 1.3 by 0.6 cm slightly hyperdense lesion on image 15/2. This could conceivably be a cortical lesion such as venous angioma, or anterior cerebral artery aneurysm. This structure was probably present on last month 's exam, but was volume averaged leading to poor  conspicuity on that exam. For Otherwise, the brainstem, cerebellum, cerebral peduncles, thalami, basal ganglia, basilar cisterns, and ventricular system appear within normal limits. No intracranial hemorrhage or acute CVA identified. Mild chronic ischemic microvascular white matter disease. Polypoid mucoperiosteal thickening in the maxillary sinuses. Opacification of several ethmoid air cells, as before. Left mastoidectomy. IMPRESSION: 1. Slightly hyperdense 1.3 by 0.6 cm lesion along the anterior falx, possibly a left medial frontal lobe cortical lesion such as a venous angioma, but I cannot exclude any anterior cerebral artery aneurysm. Consider MRI brain with MR angiography, or CT angiography of the brain, for further characterization. 2. No stroke identified. 3. Paranasal sinusitis involving the maxillary and ethmoid sinuses. Prior left mastoidectomy. 4. Mild chronic ischemic microvascular white matter disease. Electronically Signed   By: Van Clines M.D.   On: 09/14/2015 21:09    EKG: Independently reviewed.  Ordered, but still unavailable.  Assessment/Plan Principal Problem:   TIA (transient ischemic attack) Patient still having mild headache, but no further symptoms. Admit to neuro telemetry floor/ulceration. Frequent neuro checks. MRI/MRA of the brain. Hold aspirin until MRI/MRA is done to rule out aneurysm. Nothing by mouth until swallow test is passed. Check echocardiogram and carotid Doppler. Check fasting lipid profile and hemoglobin A1c.   Active Problems:   Hyperlipidemia Check fasting lipids in a.m. Continue atorvastatin. Monitor LFTs periodically.    Essential hypertension Continue amlodipine 10 mg by mouth daily. Continue chlorthalidone 25 mg by mouth daily. Continue lisinopril 20 mg by mouth daily. Continue metoprolol 50 mg by mouth twice a day. Monitor blood pressure.    Depression Stable. Continue Lexapro.    Type 2 diabetes mellitus ([place]) The patient  states he has not been following a diabetic diet, but he is eating more fruits and vegetables. Blood glucose this evening was 222 mg/dL. Check hemoglobin A1c in a.m. Carbohydrate modified diet and CBG monitoring while in the hospital.    Leukocytosis Likely due to a stress. Recheck CBC in a.m.   Neurology is following.   Code Status: Full  code. DVT Prophylaxis: SCDs. Family Communication:  Disposition Plan: Admit to neuro telemetry floor for further workup and neurology evaluation.  Time spent: About 70 minutes were spent during the process of this admission.  Reubin Milan, M.D. [facility] Pager [phone].

## 2015-09-14 NOTE — ED Notes (Signed)
Patient transported to CT 

## 2015-09-14 NOTE — ED Notes (Signed)
Pt resting with eyes closed. No acute distress noted.

## 2015-09-15 ENCOUNTER — Encounter (HOSPITAL_COMMUNITY): Payer: Managed Care, Other (non HMO)

## 2015-09-15 ENCOUNTER — Observation Stay (HOSPITAL_COMMUNITY): Payer: Managed Care, Other (non HMO)

## 2015-09-15 DIAGNOSIS — E1169 Type 2 diabetes mellitus with other specified complication: Secondary | ICD-10-CM | POA: Diagnosis not present

## 2015-09-15 DIAGNOSIS — G459 Transient cerebral ischemic attack, unspecified: Secondary | ICD-10-CM

## 2015-09-15 DIAGNOSIS — D72829 Elevated white blood cell count, unspecified: Secondary | ICD-10-CM | POA: Diagnosis not present

## 2015-09-15 DIAGNOSIS — E119 Type 2 diabetes mellitus without complications: Secondary | ICD-10-CM

## 2015-09-15 DIAGNOSIS — I671 Cerebral aneurysm, nonruptured: Secondary | ICD-10-CM | POA: Diagnosis not present

## 2015-09-15 DIAGNOSIS — E785 Hyperlipidemia, unspecified: Secondary | ICD-10-CM

## 2015-09-15 DIAGNOSIS — I10 Essential (primary) hypertension: Secondary | ICD-10-CM

## 2015-09-15 DIAGNOSIS — G43809 Other migraine, not intractable, without status migrainosus: Secondary | ICD-10-CM | POA: Diagnosis not present

## 2015-09-15 LAB — GLUCOSE, CAPILLARY
GLUCOSE-CAPILLARY: 91 mg/dL (ref 65–99)
GLUCOSE-CAPILLARY: 82 mg/dL (ref 65–99)
Glucose-Capillary: 96 mg/dL (ref 65–99)
Glucose-Capillary: 123 mg/dL — ABNORMAL HIGH (ref 65–99)

## 2015-09-15 LAB — SEDIMENTATION RATE: Sed Rate: 27 mm/hr — ABNORMAL HIGH (ref 0–16)

## 2015-09-15 MED ORDER — LISINOPRIL 40 MG PO TABS
40.0000 mg | ORAL_TABLET | Freq: Every morning | ORAL | Status: DC
  Administered 2015-09-15 – 2015-09-17 (×2): 40 mg via ORAL
  Filled 2015-09-15: qty 1
  Filled 2015-09-15 (×2): qty 2
  Filled 2015-09-15 (×2): qty 1

## 2015-09-15 MED ORDER — ESCITALOPRAM OXALATE 10 MG PO TABS
10.0000 mg | ORAL_TABLET | Freq: Every day | ORAL | Status: DC
  Administered 2015-09-15 – 2015-09-17 (×3): 10 mg via ORAL
  Filled 2015-09-15 (×3): qty 1

## 2015-09-15 MED ORDER — LORAZEPAM 2 MG/ML IJ SOLN
1.0000 mg | Freq: Once | INTRAMUSCULAR | Status: AC
  Administered 2015-09-15: 1 mg via INTRAVENOUS
  Filled 2015-09-15: qty 1

## 2015-09-15 MED ORDER — SODIUM CHLORIDE 0.9% FLUSH
3.0000 mL | Freq: Two times a day (BID) | INTRAVENOUS | Status: DC
Start: 2015-09-15 — End: 2015-09-17
  Administered 2015-09-15: 10 mL via INTRAVENOUS
  Administered 2015-09-15 – 2015-09-17 (×3): 3 mL via INTRAVENOUS

## 2015-09-15 MED ORDER — AMLODIPINE BESYLATE 10 MG PO TABS
10.0000 mg | ORAL_TABLET | Freq: Every day | ORAL | Status: DC
  Administered 2015-09-15 – 2015-09-17 (×2): 10 mg via ORAL
  Filled 2015-09-15 (×2): qty 1

## 2015-09-15 MED ORDER — ACETAMINOPHEN 650 MG RE SUPP: 650.0000 mg | Freq: Four times a day (QID) | RECTAL | Status: DC | PRN

## 2015-09-15 MED ORDER — CHLORTHALIDONE 25 MG PO TABS
25.0000 mg | ORAL_TABLET | Freq: Every day | ORAL | Status: DC
  Administered 2015-09-15 – 2015-09-17 (×2): 25 mg via ORAL
  Filled 2015-09-15 (×2): qty 1

## 2015-09-15 MED ORDER — ALPRAZOLAM 0.5 MG PO TABS: 0.5000 mg | ORAL_TABLET | Freq: Two times a day (BID) | ORAL | Status: DC | PRN

## 2015-09-15 MED ORDER — VALPROATE SODIUM 500 MG/5ML IV SOLN
500.0000 mg | Freq: Once | INTRAVENOUS | Status: AC
  Administered 2015-09-15: 500 mg via INTRAVENOUS
  Filled 2015-09-15: qty 5

## 2015-09-15 MED ORDER — DEXTROSE-NACL 5-0.9 % IV SOLN: INTRAVENOUS | Status: DC

## 2015-09-15 MED ORDER — ACETAMINOPHEN 325 MG PO TABS
650.0000 mg | ORAL_TABLET | Freq: Four times a day (QID) | ORAL | Status: DC | PRN
  Administered 2015-09-16 (×2): 650 mg via ORAL
  Filled 2015-09-15 (×2): qty 2

## 2015-09-15 MED ORDER — INSULIN ASPART 100 UNIT/ML ~~LOC~~ SOLN
0.0000 [IU] | Freq: Three times a day (TID) | SUBCUTANEOUS | Status: DC
  Administered 2015-09-16: 1 [IU] via SUBCUTANEOUS

## 2015-09-15 MED ORDER — FLUTICASONE PROPIONATE 50 MCG/ACT NA SUSP
2.0000 | Freq: Every day | NASAL | Status: DC | PRN
  Filled 2015-09-15: qty 16

## 2015-09-15 MED ORDER — STROKE: EARLY STAGES OF RECOVERY BOOK
Freq: Once | Status: AC
  Administered 2015-09-15: 01:00:00
  Filled 2015-09-15: qty 1

## 2015-09-15 MED ORDER — ENOXAPARIN SODIUM 40 MG/0.4ML ~~LOC~~ SOLN
40.0000 mg | SUBCUTANEOUS | Status: DC
  Administered 2015-09-15 – 2015-09-17 (×3): 40 mg via SUBCUTANEOUS
  Filled 2015-09-15 (×3): qty 0.4

## 2015-09-15 MED ORDER — DIVALPROEX SODIUM 500 MG PO DR TAB
500.0000 mg | DELAYED_RELEASE_TABLET | Freq: Two times a day (BID) | ORAL | Status: DC
  Administered 2015-09-15 – 2015-09-17 (×4): 500 mg via ORAL
  Filled 2015-09-15 (×4): qty 1

## 2015-09-15 MED ORDER — ATORVASTATIN CALCIUM 10 MG PO TABS
10.0000 mg | ORAL_TABLET | Freq: Every day | ORAL | Status: DC
  Administered 2015-09-15 – 2015-09-16 (×2): 10 mg via ORAL
  Filled 2015-09-15 (×3): qty 1

## 2015-09-15 MED ORDER — BACLOFEN 10 MG PO TABS
10.0000 mg | ORAL_TABLET | Freq: Once | ORAL | Status: AC
  Administered 2015-09-15: 10 mg via ORAL
  Filled 2015-09-15: qty 1

## 2015-09-15 MED ORDER — METOPROLOL TARTRATE 50 MG PO TABS
50.0000 mg | ORAL_TABLET | Freq: Two times a day (BID) | ORAL | Status: DC
  Administered 2015-09-15 – 2015-09-17 (×4): 50 mg via ORAL
  Filled 2015-09-15 (×4): qty 1

## 2015-09-15 MED ORDER — SENNOSIDES-DOCUSATE SODIUM 8.6-50 MG PO TABS: 1.0000 | ORAL_TABLET | Freq: Every evening | ORAL | Status: DC | PRN

## 2015-09-15 MED ORDER — BACLOFEN 10 MG PO TABS
10.0000 mg | ORAL_TABLET | Freq: Every day | ORAL | Status: DC
  Administered 2015-09-15 – 2015-09-16 (×2): 10 mg via ORAL
  Filled 2015-09-15 (×2): qty 1

## 2015-09-15 NOTE — Consult Note (Signed)
Chief Complaint: Patient was seen in consultation today for cerebral arteriogram Chief Complaint  Patient presents with  . TIA Symptoms    at the request of Dr Audria Nine  Referring Physician(s): Dr Audria Nine  Supervising Physician: Luanne Bras  History of Present Illness: [name] is a 60 y.o. male   Pt developed symptoms of numb lips and tongue last evening Headache and Rt facial numbness Presented to ED last pm MRI/MRA MRA HEAD: Moderately motion degraded examination.  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 Poorly visualized RIGHT A2 segment. Recommend CTA head for further characterization.  No emergent large vessel occlusion or high-grade stenosis though motion limits assessment.  sxs now resolving Scheduled for cerebral arteriogram per Dr Silverio Decamp Dr Estanislado Pandy has seen imaging and approves procedure   Past Medical History  Diagnosis Date  . Chest pain, atypical     12/2003:  negative cardiolyte  . HTN (hypertension)   . HLD (hyperlipidemia)   . Depression   . Hx of tear of ACL (anterior cruciate ligament)     right  . History of meniscal tear     bilateral  . Erectile dysfunction   . Insomnia   . Eczema   . Olecranon bursitis of left elbow 10/2009    s/p I&D by Dr Maxie Better, initially assessed by Dr. Nori Riis   . Allergy   . Anxiety   . Cataract   . Prediabetes   . TIA (transient ischemic attack)     Past Surgical History  Procedure Laterality Date  . Skin tag removal      11 removed  . Knee arthroscopy    . Incise and drain abcess      L elbow due to cellulitis/bursitis  . Inner ear surgery      Allergies: Review of patient's allergies indicates no known allergies.  Medications: Prior to Admission medications   Medication Sig Start Date End Date Taking? Authorizing Provider  amLODipine (NORVASC) 10 MG tablet Take 1 tablet (10 mg total) by mouth daily. 10/08/14  Yes Davonna Belling, MD  aspirin  81 MG chewable tablet Chew 81 mg by mouth daily.     Yes Historical Provider, MD  atorvastatin (LIPITOR) 10 MG tablet Take 10 mg by mouth daily at 6 PM.  12/01/14 12/01/15 Yes Historical Provider, MD  chlorthalidone (HYGROTON) 25 MG tablet Take 25 mg by mouth daily. 07/29/15 10/27/15 Yes Historical Provider, MD  escitalopram (LEXAPRO) 10 MG tablet Take 10 mg by mouth daily.  11/29/14  Yes Historical Provider, MD  fluticasone (FLONASE) 50 MCG/ACT nasal spray Place 2 sprays into the nose daily. Patient taking differently: Place 2 sprays into the nose daily as needed for allergies.  09/07/12  Yes Cleatrice Burke, PA-C  ibuprofen (ADVIL,MOTRIN) 800 MG tablet Take 800 mg by mouth every 8 (eight) hours as needed for pain. Reported on 07/28/2015   Yes Historical Provider, MD  lisinopril (PRINIVIL,ZESTRIL) 20 MG tablet Take 2 tablets (40 mg total) by mouth every morning. 10/08/14  Yes Davonna Belling, MD  metoprolol (LOPRESSOR) 50 MG tablet Take 1 tablet (50 mg total) by mouth 2 (two) times daily. 10/08/14  Yes Davonna Belling, MD  erythromycin ophthalmic ointment Place 1 application into the right eye 3 (three) times daily. Place 1/2 inch ribbon of ointment in the affected eye 3 times a day Patient not taking: Reported on 03/05/2015 02/08/15   Antonietta Breach, PA-C  silver sulfADIAZINE (SILVADENE) 1 % cream  Apply 1 application topically daily. Patient not taking: Reported on 03/05/2015 09/21/14   Charlann Lange, PA-C     Family History  Problem Relation Age of Onset  . Colon cancer Neg Hx   . Rectal cancer Neg Hx   . Stomach cancer Neg Hx   . Stroke Mother   . Hypertension Mother   . Aneurysm Mother 36    Died of brain aneursym  . Heart failure Father   . Emphysema Father   . Diabetes Mellitus II Sister     Social History   Social History  . Marital Status: Divorced    Spouse Name: N/A  . Number of Children: N/A  . Years of Education: N/A   Social History Main Topics  . Smoking status: Never Smoker   .  Smokeless tobacco: Never Used  . Alcohol Use: No  . Drug Use: No  . Sexual Activity: Not Asked   Other Topics Concern  . None   Social History Narrative   Separated, has 2 daughters, works as a Forensic psychologist, Therapist, occupational     Review of Systems: A 12 point ROS discussed and pertinent positives are indicated in the HPI above.  All other systems are negative.  Review of Systems  Constitutional: Negative for fever, activity change, appetite change and fatigue.  HENT: Negative for tinnitus and trouble swallowing.   Eyes: Negative for visual disturbance.  Respiratory: Negative for cough and shortness of breath.   Musculoskeletal: Negative for gait problem.  Neurological: Positive for numbness and headaches. Negative for dizziness, tremors, seizures, syncope, facial asymmetry, speech difficulty, weakness and light-headedness.  Psychiatric/Behavioral: Negative for behavioral problems and confusion.    Vital Signs: BP 119/54 mmHg  Pulse 68  Temp(Src) 98.2 F (36.8 C) (Oral)  Resp 15  Ht 5\' 8"  (1.727 m)  Wt 246 lb 4.1 oz (111.7 kg)  BMI 37.45 kg/m2  SpO2 98%  Physical Exam  Constitutional: He is oriented to person, place, and time.  HENT:  Head: Atraumatic.  Eyes: EOM are normal.  Neck: Neck supple.  Cardiovascular: Normal rate, regular rhythm and normal heart sounds.   No murmur heard. Pulmonary/Chest: Effort normal and breath sounds normal. He has no wheezes.  Abdominal: Soft. Bowel sounds are normal. There is no tenderness.  Musculoskeletal: Normal range of motion. He exhibits no edema or tenderness.  Neurological: He is alert and oriented to person, place, and time.  Skin: Skin is warm and dry.  Psychiatric: He has a normal mood and affect. His behavior is normal. Judgment and thought content normal.  Nursing note and vitals reviewed.   Mallampati Score:  MD Evaluation Airway: WNL Heart: WNL Abdomen: WNL ASA  Classification: 2 Mallampati/Airway Score:  Two  Imaging: Dg Chest 2 View  09/15/2015  CLINICAL DATA:  Headache for 2 days.  Possible TIA. EXAM: CHEST  2 VIEW COMPARISON:  11/19/2009 FINDINGS: The heart size and mediastinal contours are within normal limits. Both lungs are clear. The visualized skeletal structures are unremarkable. IMPRESSION: No active cardiopulmonary disease. Electronically Signed   By: Lucienne Capers M.D.   On: 09/15/2015 02:41   Ct Head Wo Contrast  09/14/2015  CLINICAL DATA:  Transient ischemic attack in March. Headache starting 7 p.m. tonight with perioral numbness. Shortness of breath. Anxiety. EXAM: CT HEAD WITHOUT CONTRAST TECHNIQUE: Contiguous axial images were obtained from the base of the skull through the vertex without intravenous contrast. COMPARISON:  07/28/2015 FINDINGS: Along the medial left frontal cortex there is a  suggestion of a 1.3 by 0.6 cm slightly hyperdense lesion on image 15/2. This could conceivably be a cortical lesion such as venous angioma, or anterior cerebral artery aneurysm. This structure was probably present on last month 's exam, but was volume averaged leading to poor conspicuity on that exam. For Otherwise, the brainstem, cerebellum, cerebral peduncles, thalami, basal ganglia, basilar cisterns, and ventricular system appear within normal limits. No intracranial hemorrhage or acute CVA identified. Mild chronic ischemic microvascular white matter disease. Polypoid mucoperiosteal thickening in the maxillary sinuses. Opacification of several ethmoid air cells, as before. Left mastoidectomy. IMPRESSION: 1. Slightly hyperdense 1.3 by 0.6 cm lesion along the anterior falx, possibly a left medial frontal lobe cortical lesion such as a venous angioma, but I cannot exclude any anterior cerebral artery aneurysm. Consider MRI brain with MR angiography, or CT angiography of the brain, for further characterization. 2. No stroke identified. 3. Paranasal sinusitis involving the maxillary and ethmoid sinuses.  Prior left mastoidectomy. 4. Mild chronic ischemic microvascular white matter disease. Electronically Signed   By: Van Clines M.D.   On: 09/14/2015 21:09   Mr Brain Wo Contrast  09/15/2015  CLINICAL DATA:  Frontal headache beginning at 7 p.m. Mouth numbness. TIA 1 month ago. History of hypertension, hyperlipidemia. EXAM: MRI HEAD WITHOUT CONTRAST MRA HEAD WITHOUT CONTRAST TECHNIQUE: Multiplanar, multiecho pulse sequences of the brain and surrounding structures were obtained without intravenous contrast. Angiographic images of the head were obtained using MRA technique without contrast. COMPARISON:  CT head September 13, 2015 FINDINGS: MRI HEAD FINDINGS INTRACRANIAL CONTENTS: No reduced diffusion to suggest acute ischemia. Subcentimeter focus of susceptibility artifact LEFT caudate head without additional signal abnormality, possible cavernoma. Ventricles and sulci are normal for patient's age. Patchy pontine and scattered subcentimeter supratentorial white matter T2 hyperintensities, without midline shift or mass effect. Prominent supra and infratentorial perivascular spaces. No abnormal extra-axial fluid collections. Focal low T2, intermediate T1 focal signal along the anterior interhemispheric fissure corresponding to CT abnormality. ORBITS: The included ocular globes and orbital contents are non-suspicious. SINUSES: Mild paranasal sinus mucosal thickening. Bilateral maxillary sinus mucosal retention cyst. Mastoid air cells are well aerated. SKULL/SOFT TISSUES: No abnormal sellar expansion. No suspicious calvarial bone marrow signal. Craniocervical junction maintained. MRA HEAD FINDINGS Moderately motion degraded examination. Anterior circulation: Normal flow related enhancement of the included cervical, petrous, cavernous and supraclinoid internal carotid arteries. Patent anterior communicating artery. 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 Probable retrograde flow  into RIGHT A3 segment. Poor visualized RIGHT A2 segment. No large vessel occlusion, high-grade stenosis, abnormal luminal irregularity. Posterior circulation: LEFT vertebral artery is dominant. Basilar artery is patent, with normal flow related enhancement of the main branch vessels. Robust RIGHT posterior communicating artery. Normal flow related enhancement of the posterior cerebral arteries. No large vessel occlusion, high-grade stenosis, abnormal luminal irregularity, aneurysm. IMPRESSION: MRI HEAD: No acute intracranial process. Susceptibility artifact LEFT caudate head could represent a cavernoma. Moderate chronic small vessel ischemic disease. MRA HEAD: Moderately motion degraded examination. 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 Poorly visualized RIGHT A2 segment. Recommend CTA head for further characterization. No emergent large vessel occlusion or high-grade stenosis though motion limits assessment. Electronically Signed   By: Elon Alas M.D.   On: 09/15/2015 02:56   Mr Jodene Nam Head/brain Wo Cm  09/15/2015  CLINICAL DATA:  Frontal headache beginning at 7 p.m. Mouth numbness. TIA 1 month ago. History of hypertension, hyperlipidemia. EXAM: MRI  HEAD WITHOUT CONTRAST MRA HEAD WITHOUT CONTRAST TECHNIQUE: Multiplanar, multiecho pulse sequences of the brain and surrounding structures were obtained without intravenous contrast. Angiographic images of the head were obtained using MRA technique without contrast. COMPARISON:  CT head September 13, 2015 FINDINGS: MRI HEAD FINDINGS INTRACRANIAL CONTENTS: No reduced diffusion to suggest acute ischemia. Subcentimeter focus of susceptibility artifact LEFT caudate head without additional signal abnormality, possible cavernoma. Ventricles and sulci are normal for patient's age. Patchy pontine and scattered subcentimeter supratentorial white matter T2 hyperintensities, without midline shift or mass effect. Prominent supra and infratentorial  perivascular spaces. No abnormal extra-axial fluid collections. Focal low T2, intermediate T1 focal signal along the anterior interhemispheric fissure corresponding to CT abnormality. ORBITS: The included ocular globes and orbital contents are non-suspicious. SINUSES: Mild paranasal sinus mucosal thickening. Bilateral maxillary sinus mucosal retention cyst. Mastoid air cells are well aerated. SKULL/SOFT TISSUES: No abnormal sellar expansion. No suspicious calvarial bone marrow signal. Craniocervical junction maintained. MRA HEAD FINDINGS Moderately motion degraded examination. Anterior circulation: Normal flow related enhancement of the included cervical, petrous, cavernous and supraclinoid internal carotid arteries. Patent anterior communicating artery. 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 Probable retrograde flow into RIGHT A3 segment. Poor visualized RIGHT A2 segment. No large vessel occlusion, high-grade stenosis, abnormal luminal irregularity. Posterior circulation: LEFT vertebral artery is dominant. Basilar artery is patent, with normal flow related enhancement of the main branch vessels. Robust RIGHT posterior communicating artery. Normal flow related enhancement of the posterior cerebral arteries. No large vessel occlusion, high-grade stenosis, abnormal luminal irregularity, aneurysm. IMPRESSION: MRI HEAD: No acute intracranial process. Susceptibility artifact LEFT caudate head could represent a cavernoma. Moderate chronic small vessel ischemic disease. MRA HEAD: Moderately motion degraded examination. 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 Poorly visualized RIGHT A2 segment. Recommend CTA head for further characterization. No emergent large vessel occlusion or high-grade stenosis though motion limits assessment. Electronically Signed   By: Elon Alas M.D.   On: 09/15/2015 02:56    Labs:  CBC:  Recent Labs   07/28/15 0350 07/28/15 0412 09/14/15 2049 09/14/15 2056  WBC 10.4  --  12.1*  --   HGB 15.0 15.0 15.1 16.3  HCT 44.7 44.0 43.4 48.0  PLT 364  --  377  --     COAGS:  Recent Labs  09/14/15 2049  INR 1.10  APTT 25    BMP:  Recent Labs  07/28/15 0412 09/14/15 2049 09/14/15 2056  NA 142 136 139  K 4.2 3.5 3.5  CL 106 100* 101  CO2  --  24  --   GLUCOSE 90 222* 215*  BUN 16 26* 25*  CALCIUM  --  8.9  --   CREATININE 0.80 1.17 1.10  GFRNONAA  --  >60  --   GFRAA  --  >60  --     LIVER FUNCTION TESTS:  Recent Labs  09/14/15 2049  BILITOT 0.7  AST 21  ALT 26  ALKPHOS 74  PROT 7.6  ALBUMIN 3.9    TUMOR MARKERS: No results for input(s): AFPTM, CEA, CA199, CHROMGRNA in the last 8760 hours.  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 All  of the patient's questions were answered, patient is agreeable to proceed. Consent signed and in chart.    Thank you for this interesting consult.  I greatly enjoyed meeting TYUS KELSCH and look forward to participating in their care.  A copy of this report was sent to the requesting provider on this date.  Electronically Signed: Abhiram Criado A 09/15/2015, 12:03 PM   I spent a total of 40 Minutes    in face to face in clinical consultation, greater than 50% of which was counseling/coordinating care for cerebral arteriogram

## 2015-09-15 NOTE — Care Management Note (Signed)
Case Management Note  Patient Details  Name: [name] MRN: [id number] Date of Birth: 18-May-1956  Subjective/Objective:   Pt admitted with TIA with negative MRI. He is from home alone.                 Action/Plan: Awaiting PT/OT recs. CM following for discharge disposition.   Expected Discharge Date:                  Expected Discharge Plan:     In-House Referral:     Discharge planning Services     Post Acute Care Choice:    Choice offered to:     DME Arranged:    DME Agency:     HH Arranged:    HH Agency:     Status of Service:  In process, will continue to follow  Medicare Important Message Given:    Date Medicare IM Given:    Medicare IM give by:    Date Additional Medicare IM Given:    Additional Medicare Important Message give by:     If discussed at [place] of Stay Meetings, dates discussed:    Additional Comments:  Pollie Friar, RN 09/15/2015, 3:28 PM

## 2015-09-15 NOTE — Evaluation (Signed)
Physical Therapy Evaluation Patient Details Name: [name] MRN: [id number] DOB: [birth year] Today's Date: 09/15/2015   History of Present Illness  Pt is a 60 yo male admitted for headache with visual changes as well as numbness in R face.  Pt found to have aneurysm that may or may not be related to these symptoms.  Neuro has been consulted.    Clinical Impression  Pt admitted with above diagnosis. No weakness or impaired sensation appreciated.  No instability w/ high level balance activities.  Pt is independent w/ all aspects of mobility.  No skilled PT needs identified, PT is signing off.    Follow Up Recommendations No PT follow up    Equipment Recommendations  None recommended by PT    Recommendations for Other Services       Precautions / Restrictions Precautions Precautions: Fall Restrictions Weight Bearing Restrictions: No      Mobility  Bed Mobility Overal bed mobility: Independent             General bed mobility comments: independent  Transfers Overall transfer level: Modified independent Equipment used: None             General transfer comment: Pt reports h/o feeling dizzy when he stands from bed.  Educated pt on sitting EOB ~1 minutes prior to standing and then standing at bedside ~1 minute prior to ambulating. Pt verbalized understanding and practiced technique today.  Ambulation/Gait Ambulation/Gait assistance: Independent Ambulation Distance (Feet): 200 Feet Assistive device: None Gait Pattern/deviations: WFL(Within Functional Limits)   Gait velocity interpretation: at or above normal speed for age/gender General Gait Details: No instability noted w/ high level balance activities as documented below.  No cues or physical assist needed.  Stairs Stairs: Yes Stairs assistance: Independent Stair Management: Two rails;Alternating pattern;Forwards Number of Stairs: 3 (x3) General stair comments: No cues or physical assist needed.  Performed  at normal pace and w/o instability.  Wheelchair Mobility    Modified Rankin (Stroke Patients Only) Modified Rankin (Stroke Patients Only) Pre-Morbid Rankin Score: No symptoms Modified Rankin: No symptoms     Balance Overall balance assessment: Independent Sitting-balance support: Feet supported;No upper extremity supported Sitting balance-Leahy Scale: Normal     Standing balance support: During functional activity;No upper extremity supported Standing balance-Leahy Scale: Normal               High level balance activites: Backward walking;Turns;Direction changes;Sudden stops;Head turns;Other (comment) (Stepping over object) High Level Balance Comments: No instability noted w/ high level balance activities Standardized Balance Assessment Standardized Balance Assessment : Dynamic Gait Index   Dynamic Gait Index Level Surface: Normal Gait with Horizontal Head Turns: Normal Gait with Vertical Head Turns: Normal Gait and Pivot Turn: Normal Step Over Obstacle: Normal Steps: Normal       Pertinent Vitals/Pain Pain Assessment: No/denies pain Pain Intervention(s): Limited activity within patient's tolerance;Monitored during session    Home Living Family/patient expects to be discharged to:: Private residence Living Arrangements: Alone Available Help at Discharge: Friend(s);Available PRN/intermittently Type of Home: Apartment Home Access: Stairs to enter Entrance Stairs-Rails: Left;Right;Can reach both Entrance Stairs-Number of Steps: 14 Home Layout: One level Home Equipment: None Additional Comments: Pt works at [employer] 3rd shift.  Pt said he felt tired and a bit strange bc he is accustomed to being asleep during the day.    Prior Function Level of Independence: Independent         Comments: pt drives.  Drove himself to hospital.  Hand Dominance   Dominant Hand: Right    Extremity/Trunk Assessment   Upper Extremity  Assessment: Defer to OT evaluation           Lower Extremity Assessment: Overall WFL for tasks assessed      Cervical / Trunk Assessment: Normal  Communication   Communication: No difficulties  Cognition Arousal/Alertness: Awake/alert Behavior During Therapy: WFL for tasks assessed/performed Overall Cognitive Status: Within Functional Limits for tasks assessed                      General Comments      Exercises        Assessment/Plan    PT Assessment Patent does not need any further PT services  PT Diagnosis Difficulty walking   PT Problem List    PT Treatment Interventions     PT Goals (Current goals can be found in the Care Plan section) Acute Rehab PT Goals Patient Stated Goal: to get back to work PT Goal Formulation: All assessment and education complete, DC therapy    Frequency     Barriers to discharge        Co-evaluation               End of Session Equipment Utilized During Treatment: Gait belt Activity Tolerance: Patient tolerated treatment well Patient left: in bed;with call bell/phone within reach;with family/visitor present (sitting EOB) Nurse Communication: Mobility status    Functional Assessment Tool Used: Clinical Judgement Functional Limitation: Mobility: Walking and moving around Mobility: Walking and Moving Around Current Status [id number]): 0 percent impaired, limited or restricted Mobility: Walking and Moving Around Goal Status [id number]): 0 percent impaired, limited or restricted Mobility: Walking and Moving Around Discharge Status [phone]): 0 percent impaired, limited or restricted    Time: [id number] PT Time Calculation (min) (ACUTE ONLY): 15 min   Charges:   PT Evaluation $PT Eval Low Complexity: 1 Procedure     PT G Codes:   PT G-Codes **NOT FOR INPATIENT CLASS** Functional Assessment Tool Used: Clinical Judgement Functional Limitation: Mobility: Walking and moving around Mobility: Walking and Moving Around  Current Status [id number]): 0 percent impaired, limited or restricted Mobility: Walking and Moving Around Goal Status [id number]): 0 percent impaired, limited or restricted Mobility: Walking and Moving Around Discharge Status [id number]): 0 percent impaired, limited or restricted   Collie Siad PT, DPT  Pager: [phone] Phone: [phone] 09/15/2015, 5:02 PM

## 2015-09-15 NOTE — Progress Notes (Signed)
Patient ID: [name], male   DOB: 20-Aug-1955, 60 y.o.   MRN: [id number] Unable to do procedure today due to an emergency.  Procedure will likely be some time tomorrow.  Ruey Storer E 3:19 PM 09/15/2015

## 2015-09-15 NOTE — Evaluation (Signed)
Occupational Therapy Evaluation and Discharge Summary Patient Details Name: [name] MRN: [id number] DOB: 1955-11-24 Today's Date: 09/15/2015    History of Present Illness Pt is a 60 yo male admitted for headache with visual changes as well as numbness in R face.  Pt found to have aneurysm that may or may not be related to these symptoms.  Neuro has been consulted.   Clinical Impression   Pt admitted with the above diagnosis and overall from an OT standpoint is at or close to baseline.  Pt did have very subtle balance deficits while walking in hallway but no outward LOB.  PT notified and will evaluate pt.  No further OT needs at this time.      Follow Up Recommendations  No OT follow up    Equipment Recommendations  None recommended by OT    Recommendations for Other Services PT consult     Precautions / Restrictions Precautions Precautions: Fall Restrictions Weight Bearing Restrictions: No      Mobility Bed Mobility Overal bed mobility: Independent             General bed mobility comments: independent  Transfers Overall transfer level: Modified independent Equipment used: 1 person hand held assist             General transfer comment: Pt independent with transfers. Pt needed more supervision with ambulation.    Balance Overall balance assessment: Needs assistance Sitting-balance support: Feet supported Sitting balance-Leahy Scale: Normal     Standing balance support: During functional activity Standing balance-Leahy Scale: Good Standing balance comment: Pt appeared safe and independent standing.  When ambulating, mild balance deficits noted at times.  PT notified.                            ADL Overall ADL's : At baseline                                       General ADL Comments: Pt overall did well with adls and appears at baseline. Pt did have some very mild balance issues when walking in the halllway.  Pt  usually works 3rd shift so was very tired, had glasses on that he does not nomallly wear(as opposed to contacts) and said he just felt weak.  No blatent LOB but was mildly unsteady.     Vision Vision Assessment?: Yes Eye Alignment: Within Functional Limits Ocular Range of Motion: Within Functional Limits Alignment/Gaze Preference: Within Defined Limits Tracking/Visual Pursuits: Able to track stimulus in all quads without difficulty Saccades: Within functional limits Convergence: Within functional limits Visual Fields: No apparent deficits   Agricultural engineer Tested?: Yes   Praxis Praxis Praxis tested?: Within functional limits    Pertinent Vitals/Pain Pain Assessment: 0-10 Pain Score: 3  Pain Location: head Pain Descriptors / Indicators: Aching Pain Intervention(s): Monitored during session     Hand Dominance Right   Extremity/Trunk Assessment Upper Extremity Assessment Upper Extremity Assessment: Overall WFL for tasks assessed   Lower Extremity Assessment Lower Extremity Assessment: Defer to PT evaluation   Cervical / Trunk Assessment Cervical / Trunk Assessment: Normal   Communication Communication Communication: No difficulties   Cognition Arousal/Alertness: Awake/alert Behavior During Therapy: WFL for tasks assessed/performed Overall Cognitive Status: Within Functional Limits for tasks assessed  General Comments       Exercises       Shoulder Instructions      Home Living Family/patient expects to be discharged to:: Private residence Living Arrangements: Alone Available Help at Discharge: Friend(s);Available PRN/intermittently Type of Home: Apartment Home Access: Stairs to enter Entrance Stairs-Number of Steps: 14 Entrance Stairs-Rails: Left;Right Home Layout: One level     Bathroom Shower/Tub: Walk-in shower;Door   [employer] Toilet: Standard     Home Equipment: None   Additional Comments: Pt works  at [employer] 3rd shift.  Pt said he felt tired and a bit strange bc he is accustomed to being asleep during the day.      Prior Functioning/Environment Level of Independence: Independent        Comments: pt drives.  Drove himself to hospital.    OT Diagnosis:     OT Problem List:     OT Treatment/Interventions:      OT Goals(Current goals can be found in the care plan section) Acute Rehab OT Goals Patient Stated Goal: to get well and take care of this aneurysm.  Mother died of an aneurysm when he was 65 so he is anxoius. OT Goal Formulation: All assessment and education complete, DC therapy  OT Frequency:     Barriers to D/C:            Co-evaluation              End of Session Nurse Communication: Mobility status  Activity Tolerance: Patient tolerated treatment well Patient left: in bed;with call bell/phone within reach   Time: 1200-1230 OT Time Calculation (min): 30 min Charges:  OT General Charges $OT Visit: 1 Procedure OT Evaluation $OT Eval Moderate Complexity: 1 Procedure OT Treatments $Self Care/Home Management : 8-22 mins G-Codes: OT G-codes **NOT FOR INPATIENT CLASS** Functional Assessment Tool Used: clinical judgement Functional Limitation: Self care Self Care Current Status [id number]): At least 1 percent but less than 20 percent impaired, limited or restricted Self Care Goal Status [id number]): At least 1 percent but less than 20 percent impaired, limited or restricted Self Care Discharge Status [id number]): At least 1 percent but less than 20 percent impaired, limited or restricted  Glenford Peers 09/15/2015, 1:58 PM  [phone]

## 2015-09-15 NOTE — Progress Notes (Signed)
PROGRESS NOTE        PATIENT DETAILS Name: [name] Age: 60 y.o. Sex: male Date of Birth: 01/03/1956 Admit Date: 09/14/2015 Admitting Physician Reubin Milan, MD OFB:[facility], Sherrill Raring, NP Outpatient Specialists:None  Brief Narrative: Patient is a 60 y.o. male with remote history of? Migraine headaches, hypertension, diabetes who presented to the ED on 4/18 for worsening headache and right facial numbness. Per patient, his headache was throbbing, 8/10 at its worst, associated with some nausea and had zigzag lines in his visual field. 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 Neurology consulted, awaiting further recommendations.  Subjective: Currently without headaches. No longer having right facial numbness.  Assessment/Plan: Principal Problem: Headaches: Resolved. Doubt TIA. Headaches with migrainous quality with visual field problems. However MRA brain shows a saccular aneurysm-not sure if this is related to these headaches. Will check ESR, neurology has been consulted-recommendations are to hold off on ordering a CT CT angiogram head at this time till formal neurology evaluation has been completed. Recent carotid Doppler March 2017 at [facility] for significant stenosis.  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 Nontoxic appearing and afebrile. Continue to monitor off antibiotics.  Hyperlipidemia: Continue Lipitor, await fasting lipid panel  Essential hypertension: Controlled, continue amlodipine, chlorthalidone, lisinopril and metoprolol. Follow.  Type 2 diabetes mellitus: CBGs stable, continue SSI. Await A1c. Does not appear to be on any oral hypoglycemics as an outpatient. Follow.  Depression: Continue Lexapro.  DVT Prophylaxis: Prophylactic Lovenox   Code Status: Full  code   Family Communication: None at bedside  Disposition Plan: Remain inpatient-Home likely tomorrow.  Antimicrobial agents: None  Procedures: None  CONSULTS:  neurology  Time spent: 25 minutes-Greater than 50% of this time was spent in counseling, explanation of diagnosis, planning of further management, and coordination of care.  MEDICATIONS: Anti-infectives    None      Scheduled Meds: . amLODipine  10 mg Oral Daily  . atorvastatin  10 mg Oral q1800  . chlorthalidone  25 mg Oral Daily  . escitalopram  10 mg Oral Daily  . insulin aspart  0-9 Units Subcutaneous TID WC  . lisinopril  40 mg Oral q morning - 10a  . metoprolol  50 mg Oral BID  . sodium chloride flush  3 mL Intravenous Q12H   Continuous Infusions: . dextrose 5 % and 0.9% NaCl     PRN Meds:.acetaminophen **OR** acetaminophen, ALPRAZolam, fluticasone, senna-docusate   PHYSICAL EXAM: Vital signs: Filed Vitals:   09/15/15 0018 09/15/15 0200 09/15/15 0400 09/15/15 0756  BP: 133/78 116/74 107/53 133/67  Pulse: 69 64 67 69  Temp: 97.6 F (36.4 C) 97.9 F (36.6 C) 97.3 F (36.3 C) 98.2 F (36.8 C)  TempSrc: Oral Oral Oral Axillary  Resp: '20 18 18 17  '$ Height: '5\' 8"'$  (1.727 m)     Weight: 111.7 kg (246 lb 4.1 oz)     SpO2: 97% 97% 97% 100%   Filed Weights   09/14/15 2022 09/15/15 0018  Weight: 107.956 kg (238 lb) 111.7 kg (246 lb 4.1 oz)   Body mass index is 37.45 kg/(m^2).   Gen Exam: Awake and alert with clear speech. Not in any distress  Neck: Supple, No JVD.  Chest: B/L Clear.   CVS: S1 S2 Regular, no murmurs.  Abdomen: soft, BS +, non tender, non distended.  Extremities: no edema, lower extremities warm to touch Neurologic: Non Focal.   Skin: No Rash or lesions   Wounds: N/A.    LABORATORY DATA: CBC:  Recent Labs Lab 09/14/15 2049 09/14/15 2056  WBC 12.1*  --   NEUTROABS 9.0*  --   HGB 15.1 16.3  HCT 43.4 48.0  MCV 83.9  --   PLT 377  --     Basic Metabolic  Panel:  Recent Labs Lab 09/14/15 2049 09/14/15 2056  NA 136 139  K 3.5 3.5  CL 100* 101  CO2 24  --   GLUCOSE 222* 215*  BUN 26* 25*  CREATININE 1.17 1.10  CALCIUM 8.9  --   MG 1.9  --   PHOS 3.1  --     GFR: Estimated Creatinine Clearance: 85.5 mL/min (by C-G formula based on Cr of 1.1).  Liver Function Tests:  Recent Labs Lab 09/14/15 2049  AST 21  ALT 26  ALKPHOS 74  BILITOT 0.7  PROT 7.6  ALBUMIN 3.9   No results for input(s): LIPASE, AMYLASE in the last 168 hours. No results for input(s): AMMONIA in the last 168 hours.  Coagulation Profile:  Recent Labs Lab 09/14/15 2049  INR 1.10    Cardiac Enzymes: No results for input(s): CKTOTAL, CKMB, CKMBINDEX, TROPONINI in the last 168 hours.  BNP (last 3 results) No results for input(s): PROBNP in the last 8760 hours.  HbA1C: No results for input(s): HGBA1C in the last 72 hours.  CBG:  Recent Labs Lab 09/14/15 2210 09/15/15 0602  GLUCAP 161* 91    Lipid Profile: No results for input(s): CHOL, HDL, LDLCALC, TRIG, CHOLHDL, LDLDIRECT in the last 72 hours.  Thyroid Function Tests: No results for input(s): TSH, T4TOTAL, FREET4, T3FREE, THYROIDAB in the last 72 hours.  Anemia Panel: No results for input(s): VITAMINB12, FOLATE, FERRITIN, TIBC, IRON, RETICCTPCT in the last 72 hours.  Urine analysis:    Component Value Date/Time   COLORURINE YELLOW 02/06/2008 1047   APPEARANCEUR CLEAR 02/06/2008 1047   LABSPEC 1.022 02/06/2008 1047   PHURINE 6.0 02/06/2008 1047   GLUCOSEU NEGATIVE 02/06/2008 1047   HGBUR SMALL* 02/06/2008 1047   HGBUR small 02/06/2008 0856   BILIRUBINUR NEGATIVE 02/06/2008 1047   KETONESUR NEGATIVE 02/06/2008 1047   PROTEINUR NEGATIVE 02/06/2008 1047   UROBILINOGEN 0.2 02/06/2008 1047   NITRITE NEGATIVE 02/06/2008 1047   LEUKOCYTESUR NEGATIVE 02/06/2008 1047    Sepsis Labs: Lactic Acid, Venous No results found for: LATICACIDVEN  MICROBIOLOGY: No results found for this  or any previous visit (from the past 240 hour(s)).  RADIOLOGY STUDIES/RESULTS: Dg Chest 2 View  09/15/2015  CLINICAL DATA:  Headache for 2 days.  Possible TIA. EXAM: CHEST  2 VIEW COMPARISON:  11/19/2009 FINDINGS: The heart size and mediastinal contours are within normal limits. Both lungs are clear. The visualized skeletal structures are unremarkable. IMPRESSION: No active cardiopulmonary disease. Electronically Signed   By: Burman Nieves M.D.   On: 09/15/2015 02:41   Ct Head Wo Contrast  09/14/2015  CLINICAL DATA:  Transient ischemic attack in March. Headache starting 7 p.m. tonight with perioral numbness. Shortness of breath. Anxiety. EXAM: CT HEAD WITHOUT CONTRAST TECHNIQUE: Contiguous axial images were obtained from the base of the skull through the vertex without intravenous contrast. COMPARISON:  07/28/2015 FINDINGS: Along the medial left frontal cortex there is a suggestion of a  1.3 by 0.6 cm slightly hyperdense lesion on image 15/2. This could conceivably be a cortical lesion such as venous angioma, or anterior cerebral artery aneurysm. This structure was probably present on last month 's exam, but was volume averaged leading to poor conspicuity on that exam. For Otherwise, the brainstem, cerebellum, cerebral peduncles, thalami, basal ganglia, basilar cisterns, and ventricular system appear within normal limits. No intracranial hemorrhage or acute CVA identified. Mild chronic ischemic microvascular white matter disease. Polypoid mucoperiosteal thickening in the maxillary sinuses. Opacification of several ethmoid air cells, as before. Left mastoidectomy. IMPRESSION: 1. Slightly hyperdense 1.3 by 0.6 cm lesion along the anterior falx, possibly a left medial frontal lobe cortical lesion such as a venous angioma, but I cannot exclude any anterior cerebral artery aneurysm. Consider MRI brain with MR angiography, or CT angiography of the brain, for further characterization. 2. No stroke identified. 3.  Paranasal sinusitis involving the maxillary and ethmoid sinuses. Prior left mastoidectomy. 4. Mild chronic ischemic microvascular white matter disease. Electronically Signed   By: Gaylyn Rong M.D.   On: 09/14/2015 21:09   Mr Brain Wo Contrast  09/15/2015  CLINICAL DATA:  Frontal headache beginning at 7 p.m. Mouth numbness. TIA 1 month ago. History of hypertension, hyperlipidemia. EXAM: MRI HEAD WITHOUT CONTRAST MRA HEAD WITHOUT CONTRAST TECHNIQUE: Multiplanar, multiecho pulse sequences of the brain and surrounding structures were obtained without intravenous contrast. Angiographic images of the head were obtained using MRA technique without contrast. COMPARISON:  CT head September 13, 2015 FINDINGS: MRI HEAD FINDINGS INTRACRANIAL CONTENTS: No reduced diffusion to suggest acute ischemia. Subcentimeter focus of susceptibility artifact LEFT caudate head without additional signal abnormality, possible cavernoma. Ventricles and sulci are normal for patient's age. Patchy pontine and scattered subcentimeter supratentorial white matter T2 hyperintensities, without midline shift or mass effect. Prominent supra and infratentorial perivascular spaces. No abnormal extra-axial fluid collections. Focal low T2, intermediate T1 focal signal along the anterior interhemispheric fissure corresponding to CT abnormality. ORBITS: The included ocular globes and orbital contents are non-suspicious. SINUSES: Mild paranasal sinus mucosal thickening. Bilateral maxillary sinus mucosal retention cyst. Mastoid air cells are well aerated. SKULL/SOFT TISSUES: No abnormal sellar expansion. No suspicious calvarial bone marrow signal. Craniocervical junction maintained. MRA HEAD FINDINGS Moderately motion degraded examination. Anterior circulation: Normal flow related enhancement of the included cervical, petrous, cavernous and supraclinoid internal carotid arteries. Patent anterior communicating artery. 8 x 6 mm saccular aneurysm appears to  arise from approximate LEFT A2-3 junction. Probable retrograde flow into RIGHT A3 segment. Poor visualized RIGHT A2 segment. No large vessel occlusion, high-grade stenosis, abnormal luminal irregularity. Posterior circulation: LEFT vertebral artery is dominant. Basilar artery is patent, with normal flow related enhancement of the main branch vessels. Robust RIGHT posterior communicating artery. Normal flow related enhancement of the posterior cerebral arteries. No large vessel occlusion, high-grade stenosis, abnormal luminal irregularity, aneurysm. IMPRESSION: MRI HEAD: No acute intracranial process. Susceptibility artifact LEFT caudate head could represent a cavernoma. Moderate chronic small vessel ischemic disease. MRA HEAD: Moderately motion degraded examination. 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 Poorly visualized RIGHT A2 segment. Recommend CTA head for further characterization. No emergent large vessel occlusion or high-grade stenosis though motion limits assessment. Electronically Signed   By: Awilda Metro M.D.   On: 09/15/2015 02:56   Mr Maxine Glenn Head/brain Wo Cm  09/15/2015  CLINICAL DATA:  Frontal headache beginning at 7 p.m. Mouth numbness. TIA 1 month ago. History of hypertension, hyperlipidemia. EXAM: MRI HEAD WITHOUT CONTRAST  MRA HEAD WITHOUT CONTRAST TECHNIQUE: Multiplanar, multiecho pulse sequences of the brain and surrounding structures were obtained without intravenous contrast. Angiographic images of the head were obtained using MRA technique without contrast. COMPARISON:  CT head September 13, 2015 FINDINGS: MRI HEAD FINDINGS INTRACRANIAL CONTENTS: No reduced diffusion to suggest acute ischemia. Subcentimeter focus of susceptibility artifact LEFT caudate head without additional signal abnormality, possible cavernoma. Ventricles and sulci are normal for patient's age. Patchy pontine and scattered subcentimeter supratentorial white matter T2 hyperintensities,  without midline shift or mass effect. Prominent supra and infratentorial perivascular spaces. No abnormal extra-axial fluid collections. Focal low T2, intermediate T1 focal signal along the anterior interhemispheric fissure corresponding to CT abnormality. ORBITS: The included ocular globes and orbital contents are non-suspicious. SINUSES: Mild paranasal sinus mucosal thickening. Bilateral maxillary sinus mucosal retention cyst. Mastoid air cells are well aerated. SKULL/SOFT TISSUES: No abnormal sellar expansion. No suspicious calvarial bone marrow signal. Craniocervical junction maintained. MRA HEAD FINDINGS Moderately motion degraded examination. Anterior circulation: Normal flow related enhancement of the included cervical, petrous, cavernous and supraclinoid internal carotid arteries. Patent anterior communicating artery. 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 Probable retrograde flow into RIGHT A3 segment. Poor visualized RIGHT A2 segment. No large vessel occlusion, high-grade stenosis, abnormal luminal irregularity. Posterior circulation: LEFT vertebral artery is dominant. Basilar artery is patent, with normal flow related enhancement of the main branch vessels. Robust RIGHT posterior communicating artery. Normal flow related enhancement of the posterior cerebral arteries. No large vessel occlusion, high-grade stenosis, abnormal luminal irregularity, aneurysm. IMPRESSION: MRI HEAD: No acute intracranial process. Susceptibility artifact LEFT caudate head could represent a cavernoma. Moderate chronic small vessel ischemic disease. MRA HEAD: Moderately motion degraded examination. 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 Poorly visualized RIGHT A2 segment. Recommend CTA head for further characterization. No emergent large vessel occlusion or high-grade stenosis though motion limits assessment. Electronically Signed   By: Elon Alas M.D.    On: 09/15/2015 02:56       Oren Binet, MD  [facility] Pager:[phone]  If 7PM-7AM, please contact night-coverage [email or web address] Password [facility] 09/15/2015, 9:37 AM

## 2015-09-16 ENCOUNTER — Observation Stay (HOSPITAL_COMMUNITY): Payer: Managed Care, Other (non HMO)

## 2015-09-16 DIAGNOSIS — G43809 Other migraine, not intractable, without status migrainosus: Secondary | ICD-10-CM

## 2015-09-16 DIAGNOSIS — I10 Essential (primary) hypertension: Secondary | ICD-10-CM | POA: Diagnosis not present

## 2015-09-16 DIAGNOSIS — E1169 Type 2 diabetes mellitus with other specified complication: Secondary | ICD-10-CM

## 2015-09-16 DIAGNOSIS — I671 Cerebral aneurysm, nonruptured: Secondary | ICD-10-CM

## 2015-09-16 LAB — GLUCOSE, CAPILLARY
GLUCOSE-CAPILLARY: 107 mg/dL — AB (ref 65–99)
GLUCOSE-CAPILLARY: 88 mg/dL (ref 65–99)
GLUCOSE-CAPILLARY: 94 mg/dL (ref 65–99)
Glucose-Capillary: 133 mg/dL — ABNORMAL HIGH (ref 65–99)

## 2015-09-16 LAB — SURGICAL PCR SCREEN
MRSA, PCR: NEGATIVE
STAPHYLOCOCCUS AUREUS: POSITIVE — AB

## 2015-09-16 MED ORDER — HEPARIN SODIUM (PORCINE) 1000 UNIT/ML IJ SOLN
INTRAMUSCULAR | Status: AC | PRN
  Administered 2015-09-16: 1000 [IU] via INTRAVENOUS

## 2015-09-16 MED ORDER — IOPAMIDOL (ISOVUE-300) INJECTION 61%
INTRAVENOUS | Status: AC
  Administered 2015-09-16: 70 mL
  Filled 2015-09-16: qty 150

## 2015-09-16 MED ORDER — SODIUM CHLORIDE 0.9 % IV SOLN
INTRAVENOUS | Status: AC | PRN
  Administered 2015-09-16: 50 mL/h via INTRAVENOUS

## 2015-09-16 MED ORDER — MUPIROCIN 2 % EX OINT
TOPICAL_OINTMENT | Freq: Two times a day (BID) | CUTANEOUS | Status: DC
  Administered 2015-09-16: 1 via NASAL
  Administered 2015-09-16 – 2015-09-17 (×2): via NASAL
  Filled 2015-09-16: qty 22

## 2015-09-16 MED ORDER — KETOROLAC TROMETHAMINE 30 MG/ML IJ SOLN
30.0000 mg | Freq: Three times a day (TID) | INTRAMUSCULAR | Status: DC | PRN
  Administered 2015-09-16: 30 mg via INTRAVENOUS
  Filled 2015-09-16: qty 1

## 2015-09-16 MED ORDER — HEPARIN SODIUM (PORCINE) 1000 UNIT/ML IJ SOLN
INTRAMUSCULAR | Status: AC
  Filled 2015-09-16: qty 1

## 2015-09-16 MED ORDER — MIDAZOLAM HCL 2 MG/2ML IJ SOLN
INTRAMUSCULAR | Status: AC | PRN
  Administered 2015-09-16: 1 mg via INTRAVENOUS

## 2015-09-16 MED ORDER — SODIUM CHLORIDE 0.9 % IV SOLN: INTRAVENOUS | Status: AC

## 2015-09-16 MED ORDER — DIPHENHYDRAMINE HCL 50 MG/ML IJ SOLN
25.0000 mg | Freq: Three times a day (TID) | INTRAMUSCULAR | Status: DC | PRN
  Administered 2015-09-16: 25 mg via INTRAVENOUS
  Filled 2015-09-16: qty 1

## 2015-09-16 MED ORDER — MIDAZOLAM HCL 2 MG/2ML IJ SOLN
INTRAMUSCULAR | Status: AC
  Filled 2015-09-16: qty 2

## 2015-09-16 MED ORDER — IOPAMIDOL (ISOVUE-300) INJECTION 61%
INTRAVENOUS | Status: AC
  Administered 2015-09-16: 10 mL
  Filled 2015-09-16: qty 50

## 2015-09-16 MED ORDER — METOCLOPRAMIDE HCL 5 MG/ML IJ SOLN
10.0000 mg | Freq: Three times a day (TID) | INTRAMUSCULAR | Status: DC | PRN
  Administered 2015-09-16: 10 mg via INTRAVENOUS
  Filled 2015-09-16: qty 2

## 2015-09-16 MED ORDER — FENTANYL CITRATE (PF) 100 MCG/2ML IJ SOLN
INTRAMUSCULAR | Status: AC | PRN
  Administered 2015-09-16: 25 ug via INTRAVENOUS

## 2015-09-16 MED ORDER — FENTANYL CITRATE (PF) 100 MCG/2ML IJ SOLN
INTRAMUSCULAR | Status: AC
  Filled 2015-09-16: qty 2

## 2015-09-16 MED ORDER — LIDOCAINE HCL 1 % IJ SOLN
INTRAMUSCULAR | Status: AC
  Filled 2015-09-16: qty 20

## 2015-09-16 NOTE — Sedation Documentation (Signed)
5 fr. Exoseal to right groin 

## 2015-09-16 NOTE — Progress Notes (Signed)
PROGRESS NOTE  [name] [id number] DOB: 07/15/1955 DOA: 09/14/2015 PCP: Berkley Harvey, NP Outpatient Specialists:     Brief Narrative: Patient is a 60 year old male with remote history of migraine headaches, hypertension, diabetes, and TIA in March 2017 who presented to the ED on 4/18 for worsening headache and right facial numbness. Per patient, his headache was throbbing, 8/10 at its worst, associated with some nausea and had zigzag lines in his visual field. ED obtained head CT showing a hyperdense 1.3x0.6 cm lesion along the anterior falx. 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 Neurology and IR consulted. Underwent cerebral angiogram on 4/20 w  Subjective: Family at bedside. No headaches  Assessment & Plan: Principal Problem: Headaches: Resolved.Suspect related to Migraines and not related to Aneurysm. Doubt TIA and doubt any need for TIA work up.ESR only 27-doubt temporal arteritis. Continue Baclofen and depakote for prophylaxis.  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 IR planning on initiating Plavix, monitoring overnight and likely will undergo definite treatment next week.  Leukocytosis: Mild leukocytosis. Nontoxic appearing and afebrile. Continue to monitor off antibiotics.   Hyperlipidemia: Continue Lipitor  Essential hypertension: Controlled, continue amlodipine, chlorthalidone, lisinopril and metoprolol. Monitor  Type 2 diabetes mellitus: CBGs stable,with SSI. Does not appear to be on any oral hypoglycemics as an outpatient.   Depression: Continue Lexapro.  DVT prophylaxis: Lovenox Code Status: Full code Family Communication: Family at bedside Disposition Plan:Home tomorrow  Consultants:   Neurology Interventional radiology  Procedures:  Cerebral Angiogram 4/20>>9.148mx 7.1 mm pericallosal bilobed aneurysm  Antimicrobials: None   Objective: Filed Vitals:   09/16/15 1235 09/16/15 1240 09/16/15 1245 09/16/15 1259  BP: 136/82 120/85 138/81 134/89  Pulse: 69 69 70 69  Temp:      TempSrc:      Resp: '19 19 17 16  '$ Height:      Weight:      SpO2: 95% 95% 96% 97%    Intake/Output Summary (Last 24 hours) at 09/16/15 1307 Last data filed at 09/16/15 0803  Gross per 24 hour  Intake    120 ml  Output    650 ml  Net   -530 ml   Filed Weights   09/14/15 2022 09/15/15 0018  Weight: 107.956 kg (238 lb) 111.7 kg (246 lb 4.1 oz)   Examination:  Filed Vitals:   09/16/15 1235 09/16/15 1240 09/16/15 1245 09/16/15 1259  BP: 136/82 120/85 138/81 134/89  Pulse: 69 69 70 69  Temp:      TempSrc:      Resp: '19 19 17 16  '$ Height:      Weight:      SpO2: 95% 95% 96% 97%   Constitutional: NAD, well nourished, well developed Eyes: PERRL Neck: supple, no JVD Respiratory: clear to auscultation bilaterally. Normal respiratory effort. No accessory muscle use  Cardiovascular: Regular rate and rhythm Abdomen: Soft, non tender, non distended. Bowel sounds positive Skin: no rashes or induration seen on limited exam Neurologic: Non focal  Psychiatric: Normal judgment and insight. Alert and oriented x 3. Normal mood   Data Reviewed: I have personally reviewed following labs and imaging studies  CBC:  Recent Labs Lab 09/14/15 2049 09/14/15 2056  WBC 12.1*  --   NEUTROABS  9.0*  --   HGB 15.1 16.3  HCT 43.4 48.0  MCV 83.9  --   PLT 377  --    Basic Metabolic Panel:  Recent Labs Lab 09/14/15 2049 09/14/15 2056  NA 136 139  K 3.5 3.5  CL 100* 101  CO2 24  --   GLUCOSE 222* 215*  BUN 26* 25*  CREATININE 1.17 1.10  CALCIUM 8.9  --   MG 1.9  --   PHOS 3.1  --    GFR: Estimated Creatinine Clearance: 85.5 mL/min (by C-G formula based on Cr of 1.1). Liver  Function Tests:  Recent Labs Lab 09/14/15 2049  AST 21  ALT 26  ALKPHOS 74  BILITOT 0.7  PROT 7.6  ALBUMIN 3.9   Coagulation Profile:  Recent Labs Lab 09/14/15 2049  INR 1.10   CBG:  Recent Labs Lab 09/15/15 1308 09/15/15 1654 09/15/15 2123 09/16/15 0618 09/16/15 1110  GLUCAP 96 82 123* 107* 88   Urine analysis:    Component Value Date/Time   COLORURINE YELLOW 02/06/2008 1047   APPEARANCEUR CLEAR 02/06/2008 1047   LABSPEC 1.022 02/06/2008 1047   PHURINE 6.0 02/06/2008 1047   GLUCOSEU NEGATIVE 02/06/2008 1047   HGBUR SMALL* 02/06/2008 1047   HGBUR small 02/06/2008 0856   BILIRUBINUR NEGATIVE 02/06/2008 1047   KETONESUR NEGATIVE 02/06/2008 1047   PROTEINUR NEGATIVE 02/06/2008 1047   UROBILINOGEN 0.2 02/06/2008 1047   NITRITE NEGATIVE 02/06/2008 1047   LEUKOCYTESUR NEGATIVE 02/06/2008 1047    Recent Results (from the past 240 hour(s))  Surgical pcr screen     Status: Abnormal   Collection Time: 09/15/15 11:34 PM  Result Value Ref Range Status   MRSA, PCR NEGATIVE NEGATIVE Final   Staphylococcus aureus POSITIVE (A) NEGATIVE Final    Comment:        The Xpert SA Assay (FDA approved for NASAL specimens in patients over 75 years of age), is one component of a comprehensive surveillance program.  Test performance has been validated by [facility] for patients greater than or equal to 70 year old. It is not intended to diagnose infection nor to guide or monitor treatment.       Radiology Studies: Dg Chest 2 View  09/15/2015  CLINICAL DATA:  Headache for 2 days.  Possible TIA. EXAM: CHEST  2 VIEW COMPARISON:  11/19/2009 FINDINGS: The heart size and mediastinal contours are within normal limits. Both lungs are clear. The visualized skeletal structures are unremarkable. IMPRESSION: No active cardiopulmonary disease. Electronically Signed   By: Lucienne Capers M.D.   On: 09/15/2015 02:41   Ct Head Wo Contrast  09/14/2015  CLINICAL DATA:  Transient  ischemic attack in March. Headache starting 7 p.m. tonight with perioral numbness. Shortness of breath. Anxiety. EXAM: CT HEAD WITHOUT CONTRAST TECHNIQUE: Contiguous axial images were obtained from the base of the skull through the vertex without intravenous contrast. COMPARISON:  07/28/2015 FINDINGS: Along the medial left frontal cortex there is a suggestion of a 1.3 by 0.6 cm slightly hyperdense lesion on image 15/2. This could conceivably be a cortical lesion such as venous angioma, or anterior cerebral artery aneurysm. This structure was probably present on last month 's exam, but was volume averaged leading to poor conspicuity on that exam. For Otherwise, the brainstem, cerebellum, cerebral peduncles, thalami, basal ganglia, basilar cisterns, and ventricular system appear within normal limits. No intracranial hemorrhage or acute CVA identified. Mild chronic ischemic microvascular white matter disease. Polypoid mucoperiosteal thickening in the maxillary sinuses. Opacification of  several ethmoid air cells, as before. Left mastoidectomy. IMPRESSION: 1. Slightly hyperdense 1.3 by 0.6 cm lesion along the anterior falx, possibly a left medial frontal lobe cortical lesion such as a venous angioma, but I cannot exclude any anterior cerebral artery aneurysm. Consider MRI brain with MR angiography, or CT angiography of the brain, for further characterization. 2. No stroke identified. 3. Paranasal sinusitis involving the maxillary and ethmoid sinuses. Prior left mastoidectomy. 4. Mild chronic ischemic microvascular white matter disease. Electronically Signed   By: Van Clines M.D.   On: 09/14/2015 21:09   Mr Brain Wo Contrast  09/15/2015  CLINICAL DATA:  Frontal headache beginning at 7 p.m. Mouth numbness. TIA 1 month ago. History of hypertension, hyperlipidemia. EXAM: MRI HEAD WITHOUT CONTRAST MRA HEAD WITHOUT CONTRAST TECHNIQUE: Multiplanar, multiecho pulse sequences of the brain and surrounding structures  were obtained without intravenous contrast. Angiographic images of the head were obtained using MRA technique without contrast. COMPARISON:  CT head September 13, 2015 FINDINGS: MRI HEAD FINDINGS INTRACRANIAL CONTENTS: No reduced diffusion to suggest acute ischemia. Subcentimeter focus of susceptibility artifact LEFT caudate head without additional signal abnormality, possible cavernoma. Ventricles and sulci are normal for patient's age. Patchy pontine and scattered subcentimeter supratentorial white matter T2 hyperintensities, without midline shift or mass effect. Prominent supra and infratentorial perivascular spaces. No abnormal extra-axial fluid collections. Focal low T2, intermediate T1 focal signal along the anterior interhemispheric fissure corresponding to CT abnormality. ORBITS: The included ocular globes and orbital contents are non-suspicious. SINUSES: Mild paranasal sinus mucosal thickening. Bilateral maxillary sinus mucosal retention cyst. Mastoid air cells are well aerated. SKULL/SOFT TISSUES: No abnormal sellar expansion. No suspicious calvarial bone marrow signal. Craniocervical junction maintained. MRA HEAD FINDINGS Moderately motion degraded examination. Anterior circulation: Normal flow related enhancement of the included cervical, petrous, cavernous and supraclinoid internal carotid arteries. Patent anterior communicating artery. 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 Probable retrograde flow into RIGHT A3 segment. Poor visualized RIGHT A2 segment. No large vessel occlusion, high-grade stenosis, abnormal luminal irregularity. Posterior circulation: LEFT vertebral artery is dominant. Basilar artery is patent, with normal flow related enhancement of the main branch vessels. Robust RIGHT posterior communicating artery. Normal flow related enhancement of the posterior cerebral arteries. No large vessel occlusion, high-grade stenosis, abnormal luminal  irregularity, aneurysm. IMPRESSION: MRI HEAD: No acute intracranial process. Susceptibility artifact LEFT caudate head could represent a cavernoma. Moderate chronic small vessel ischemic disease. MRA HEAD: Moderately motion degraded examination. 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 Poorly visualized RIGHT A2 segment. Recommend CTA head for further characterization. No emergent large vessel occlusion or high-grade stenosis though motion limits assessment. Electronically Signed   By: Elon Alas M.D.   On: 09/15/2015 02:56   Mr Jodene Nam Head/brain Wo Cm  09/15/2015  CLINICAL DATA:  Frontal headache beginning at 7 p.m. Mouth numbness. TIA 1 month ago. History of hypertension, hyperlipidemia. EXAM: MRI HEAD WITHOUT CONTRAST MRA HEAD WITHOUT CONTRAST TECHNIQUE: Multiplanar, multiecho pulse sequences of the brain and surrounding structures were obtained without intravenous contrast. Angiographic images of the head were obtained using MRA technique without contrast. COMPARISON:  CT head September 13, 2015 FINDINGS: MRI HEAD FINDINGS INTRACRANIAL CONTENTS: No reduced diffusion to suggest acute ischemia. Subcentimeter focus of susceptibility artifact LEFT caudate head without additional signal abnormality, possible cavernoma. Ventricles and sulci are normal for patient's age. Patchy pontine and scattered subcentimeter supratentorial white matter T2 hyperintensities, without midline shift or mass  effect. Prominent supra and infratentorial perivascular spaces. No abnormal extra-axial fluid collections. Focal low T2, intermediate T1 focal signal along the anterior interhemispheric fissure corresponding to CT abnormality. ORBITS: The included ocular globes and orbital contents are non-suspicious. SINUSES: Mild paranasal sinus mucosal thickening. Bilateral maxillary sinus mucosal retention cyst. Mastoid air cells are well aerated. SKULL/SOFT TISSUES: No abnormal sellar expansion. No suspicious  calvarial bone marrow signal. Craniocervical junction maintained. MRA HEAD FINDINGS Moderately motion degraded examination. Anterior circulation: Normal flow related enhancement of the included cervical, petrous, cavernous and supraclinoid internal carotid arteries. Patent anterior communicating artery. 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 Probable retrograde flow into RIGHT A3 segment. Poor visualized RIGHT A2 segment. No large vessel occlusion, high-grade stenosis, abnormal luminal irregularity. Posterior circulation: LEFT vertebral artery is dominant. Basilar artery is patent, with normal flow related enhancement of the main branch vessels. Robust RIGHT posterior communicating artery. Normal flow related enhancement of the posterior cerebral arteries. No large vessel occlusion, high-grade stenosis, abnormal luminal irregularity, aneurysm. IMPRESSION: MRI HEAD: No acute intracranial process. Susceptibility artifact LEFT caudate head could represent a cavernoma. Moderate chronic small vessel ischemic disease. MRA HEAD: Moderately motion degraded examination. 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 Poorly visualized RIGHT A2 segment. Recommend CTA head for further characterization. No emergent large vessel occlusion or high-grade stenosis though motion limits assessment. Electronically Signed   By: Elon Alas M.D.   On: 09/15/2015 02:56     Scheduled Meds: . amLODipine  10 mg Oral Daily  . atorvastatin  10 mg Oral q1800  . baclofen  10 mg Oral QHS  . chlorthalidone  25 mg Oral Daily  . divalproex  500 mg Oral Q12H  . enoxaparin (LOVENOX) injection  40 mg Subcutaneous Q24H  . escitalopram  10 mg Oral Daily  . fentaNYL      . heparin      . insulin aspart  0-9 Units Subcutaneous TID WC  . lidocaine      . lisinopril  40 mg Oral q morning - 10a  . metoprolol  50 mg Oral BID  . midazolam      . mupirocin ointment   Nasal  BID  . sodium chloride flush  3 mL Intravenous Q12H   Continuous Infusions: . sodium chloride 50 mL/hr (09/16/15 1130)  . sodium chloride     Time spent: [place] MD [facility] Pager [phone]  If 7PM-7AM, please contact night-coverage [email or web address] Password [email or web address] 09/16/2015, 1:07 PM

## 2015-09-16 NOTE — Progress Notes (Signed)
Pt's BP read 102/59, HR 59, has order for lopressor 50mg  tonight, NP Forrest Moron paged to clarify whether to still give med tonight, ordered to hold med for tonight but to recheck BP after 2 hours, same related to pt, will however continue to monitor. [name], [name]

## 2015-09-16 NOTE — Procedures (Signed)
S/P 4 vessel cerebral arteriogram. RT CFA approach. Findings.Marland Kitchen 1.Approx 9.56mm x 7.1 mm RT pericallosal bilobed aneurysm.

## 2015-09-16 NOTE — Progress Notes (Signed)
Patient arrived back to room from procedure. VSS. PT to be bedrest until 1600. Family/patient aware. He denied any pain/discomfort. Encourage oral fluid. Daughter to get food at this time. Will continue to monitor,   Ave Filter, RN

## 2015-09-16 NOTE — Progress Notes (Signed)
Patient request to know what time his procedure is for today. RN called IR and staffs verbalized it would be in the afternoon. RN relay message. Patient appears very upset at this time although daughter is understanding of situation. PA will come & speak with pt/family.   Josalin Carneiro, RN.

## 2015-09-16 NOTE — Sedation Documentation (Signed)
ETCO2 removed per Dr. Deveshwar 

## 2015-09-17 ENCOUNTER — Other Ambulatory Visit (HOSPITAL_COMMUNITY): Payer: Self-pay | Admitting: Interventional Radiology

## 2015-09-17 ENCOUNTER — Other Ambulatory Visit: Payer: Self-pay | Admitting: General Surgery

## 2015-09-17 DIAGNOSIS — I10 Essential (primary) hypertension: Secondary | ICD-10-CM | POA: Diagnosis not present

## 2015-09-17 DIAGNOSIS — D72829 Elevated white blood cell count, unspecified: Secondary | ICD-10-CM | POA: Diagnosis not present

## 2015-09-17 DIAGNOSIS — G459 Transient cerebral ischemic attack, unspecified: Secondary | ICD-10-CM | POA: Diagnosis not present

## 2015-09-17 DIAGNOSIS — I671 Cerebral aneurysm, nonruptured: Secondary | ICD-10-CM

## 2015-09-17 DIAGNOSIS — E785 Hyperlipidemia, unspecified: Secondary | ICD-10-CM | POA: Diagnosis not present

## 2015-09-17 LAB — GLUCOSE, CAPILLARY
GLUCOSE-CAPILLARY: 60 mg/dL — AB (ref 65–99)
GLUCOSE-CAPILLARY: 93 mg/dL (ref 65–99)
Glucose-Capillary: 89 mg/dL (ref 65–99)

## 2015-09-17 MED ORDER — CLOPIDOGREL BISULFATE 75 MG PO TABS: 75.0000 mg | ORAL_TABLET | Freq: Every day | ORAL | Status: DC

## 2015-09-17 MED ORDER — METOPROLOL TARTRATE 50 MG PO TABS: 100.0000 mg | ORAL_TABLET | Freq: Two times a day (BID) | ORAL | Status: DC

## 2015-09-17 MED ORDER — METOPROLOL TARTRATE 50 MG PO TABS: 75.0000 mg | ORAL_TABLET | Freq: Two times a day (BID) | ORAL | Status: DC

## 2015-09-17 MED ORDER — BACLOFEN 10 MG PO TABS
10.0000 mg | ORAL_TABLET | Freq: Every day | ORAL | Status: DC
Start: 2015-09-17 — End: 2015-09-30

## 2015-09-17 MED ORDER — CLOPIDOGREL BISULFATE 75 MG PO TABS
75.0000 mg | ORAL_TABLET | Freq: Every day | ORAL | Status: DC
Start: 2015-09-17 — End: 2015-09-17
  Administered 2015-09-17: 75 mg via ORAL
  Filled 2015-09-17: qty 1

## 2015-09-17 MED ORDER — ASPIRIN 325 MG PO TBEC: 325.0000 mg | DELAYED_RELEASE_TABLET | Freq: Every day | ORAL | Status: DC

## 2015-09-17 MED ORDER — DIVALPROEX SODIUM 500 MG PO DR TAB: 500.0000 mg | DELAYED_RELEASE_TABLET | Freq: Two times a day (BID) | ORAL | Status: DC

## 2015-09-17 MED ORDER — METOPROLOL TARTRATE 100 MG PO TABS: 100.0000 mg | ORAL_TABLET | Freq: Two times a day (BID) | ORAL | Status: DC

## 2015-09-17 MED ORDER — ASPIRIN EC 325 MG PO TBEC
325.0000 mg | DELAYED_RELEASE_TABLET | Freq: Every day | ORAL | Status: DC
  Administered 2015-09-17: 325 mg via ORAL
  Filled 2015-09-17: qty 1

## 2015-09-17 NOTE — Progress Notes (Signed)
Patient ID: [name], male   DOB: [birth year], 60 y.o.   MRN: [id number] Plan to proceed with intervention on either Monday or Tuesday.  Patient will be discharged on Plavix 75mg  and ASA 325mg  per Dr. Estanislado Pandy.  [name] will call the patient with appointment date and time after arranged with anesthesia, etc.    [name] E 11:36 AM 09/17/2015

## 2015-09-17 NOTE — Care Management Note (Signed)
Case Management Note  Patient Details  Name: [name] MRN: [id number] Date of Birth: 1955-08-08  Subjective/Objective:                    Action/Plan: Pt discharging home with self care. Pt to return next week for IR procedure. No further needs per CM.   Expected Discharge Date:                  Expected Discharge Plan:  Home/Self Care  In-House Referral:     Discharge planning Services     Post Acute Care Choice:    Choice offered to:     DME Arranged:    DME Agency:     HH Arranged:    [place] Agency:     Status of Service:  Completed, signed off  Medicare Important Message Given:    Date Medicare IM Given:    Medicare IM give by:    Date Additional Medicare IM Given:    Additional Medicare Important Message give by:     If discussed at [place] of Stay Meetings, dates discussed:    Additional Comments:  Pollie Friar, RN 09/17/2015, 11:29 AM

## 2015-09-17 NOTE — Discharge Summary (Signed)
PATIENT DETAILS Name: [name] Age: 60 y.o. Sex: male Date of Birth: [birth year] MRN: [id number]. Admitting Physician: Reubin Milan, MD YOV:ZCHYI, Sherrill Raring, NP  Admit Date: 09/14/2015 Discharge date: 09/17/2015  Recommendations for Outpatient Follow-up:  1. Please ensure follow-up with interventional radiology for definite treatment of cerebral angiogram (see below)  2. Ensure follow-up with neurology for continued management of migraine headaches. 3. Medications-aspirin/Plavix started at the request of interventional radiology.  4. Please repeat CBC/BMET at next visit  PRIMARY DISCHARGE DIAGNOSIS:  Principal Problem:   TIA (transient ischemic attack) Active Problems:   Hyperlipidemia   Essential hypertension   Depression   Type 2 diabetes mellitus (HCC)   Leukocytosis      PAST MEDICAL HISTORY: Past Medical History  Diagnosis Date  . Chest pain, atypical     12/2003:  negative cardiolyte  . HTN (hypertension)   . HLD (hyperlipidemia)   . Depression   . Hx of tear of ACL (anterior cruciate ligament)     right  . History of meniscal tear     bilateral  . Erectile dysfunction   . Insomnia   . Eczema   . Olecranon bursitis of left elbow 10/2009    s/p I&D by Dr Maxie Better, initially assessed by Dr. Nori Riis   . Allergy   . Anxiety   . Cataract   . Prediabetes   . TIA (transient ischemic attack)     DISCHARGE MEDICATIONS: Current Discharge Medication List    START taking these medications   Details  aspirin EC 325 MG EC tablet Take 1 tablet (325 mg total) by mouth daily. Qty: 30 tablet, Refills: 0    baclofen (LIORESAL) 10 MG tablet Take 1 tablet (10 mg total) by mouth at bedtime. Qty: 30 each, Refills: 0    clopidogrel (PLAVIX) 75 MG tablet Take 1 tablet (75 mg total) by mouth daily. Qty: 30 tablet, Refills: 0    divalproex (DEPAKOTE) 500 MG DR tablet Take 1 tablet (500 mg total) by mouth every 12 (twelve) hours. Qty: 60 tablet, Refills: 0        CONTINUE these medications which have CHANGED   Details  metoprolol (LOPRESSOR) 50 MG tablet Take 2 tablets (100 mg total) by mouth 2 (two) times daily. Qty: 120 tablet, Refills: 0      CONTINUE these medications which have NOT CHANGED   Details  amLODipine (NORVASC) 10 MG tablet Take 1 tablet (10 mg total) by mouth daily. Qty: 30 tablet, Refills: 0    atorvastatin (LIPITOR) 10 MG tablet Take 10 mg by mouth daily at 6 PM.     chlorthalidone (HYGROTON) 25 MG tablet Take 25 mg by mouth daily.    escitalopram (LEXAPRO) 10 MG tablet Take 10 mg by mouth daily.     fluticasone (FLONASE) 50 MCG/ACT nasal spray Place 2 sprays into the nose daily. Qty: 16 g, Refills: 0    lisinopril (PRINIVIL,ZESTRIL) 20 MG tablet Take 2 tablets (40 mg total) by mouth every morning. Qty: 60 tablet, Refills: 0      STOP taking these medications     aspirin 81 MG chewable tablet      ibuprofen (ADVIL,MOTRIN) 800 MG tablet      erythromycin ophthalmic ointment      silver sulfADIAZINE (SILVADENE) 1 % cream         ALLERGIES:  No Known Allergies  BRIEF HPI:  See H&P, Labs, Consult and Test reports for all details in brief, patient is a  60 year old male with remote history of migraine headaches, hypertension, diabetes, and TIA in March 2017 who presented to the ED on 4/18 for worsening headache and right facial numbness. Per patient, his headache was throbbing, 8/10 at its worst, associated with some nausea and had zigzag lines in his visual field. ED obtained head CT showing a hyperdense 1.3x0.6 cm lesion along the anterior falx. 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 CONSULTATIONS:   neurology and IR  PERTINENT RADIOLOGIC STUDIES: Dg Chest 2 View  09/15/2015  CLINICAL DATA:  Headache for 2 days.  Possible TIA. EXAM: CHEST  2 VIEW COMPARISON:  11/19/2009 FINDINGS: The heart size and mediastinal contours are within normal  limits. Both lungs are clear. The visualized skeletal structures are unremarkable. IMPRESSION: No active cardiopulmonary disease. Electronically Signed   By: Lucienne Capers M.D.   On: 09/15/2015 02:41   Ct Head Wo Contrast  09/14/2015  CLINICAL DATA:  Transient ischemic attack in March. Headache starting 7 p.m. tonight with perioral numbness. Shortness of breath. Anxiety. EXAM: CT HEAD WITHOUT CONTRAST TECHNIQUE: Contiguous axial images were obtained from the base of the skull through the vertex without intravenous contrast. COMPARISON:  07/28/2015 FINDINGS: Along the medial left frontal cortex there is a suggestion of a 1.3 by 0.6 cm slightly hyperdense lesion on image 15/2. This could conceivably be a cortical lesion such as venous angioma, or anterior cerebral artery aneurysm. This structure was probably present on last month 's exam, but was volume averaged leading to poor conspicuity on that exam. For Otherwise, the brainstem, cerebellum, cerebral peduncles, thalami, basal ganglia, basilar cisterns, and ventricular system appear within normal limits. No intracranial hemorrhage or acute CVA identified. Mild chronic ischemic microvascular white matter disease. Polypoid mucoperiosteal thickening in the maxillary sinuses. Opacification of several ethmoid air cells, as before. Left mastoidectomy. IMPRESSION: 1. Slightly hyperdense 1.3 by 0.6 cm lesion along the anterior falx, possibly a left medial frontal lobe cortical lesion such as a venous angioma, but I cannot exclude any anterior cerebral artery aneurysm. Consider MRI brain with MR angiography, or CT angiography of the brain, for further characterization. 2. No stroke identified. 3. Paranasal sinusitis involving the maxillary and ethmoid sinuses. Prior left mastoidectomy. 4. Mild chronic ischemic microvascular white matter disease. Electronically Signed   By: Van Clines M.D.   On: 09/14/2015 21:09   Mr Brain Wo Contrast  09/15/2015  CLINICAL  DATA:  Frontal headache beginning at 7 p.m. Mouth numbness. TIA 1 month ago. History of hypertension, hyperlipidemia. EXAM: MRI HEAD WITHOUT CONTRAST MRA HEAD WITHOUT CONTRAST TECHNIQUE: Multiplanar, multiecho pulse sequences of the brain and surrounding structures were obtained without intravenous contrast. Angiographic images of the head were obtained using MRA technique without contrast. COMPARISON:  CT head September 13, 2015 FINDINGS: MRI HEAD FINDINGS INTRACRANIAL CONTENTS: No reduced diffusion to suggest acute ischemia. Subcentimeter focus of susceptibility artifact LEFT caudate head without additional signal abnormality, possible cavernoma. Ventricles and sulci are normal for patient's age. Patchy pontine and scattered subcentimeter supratentorial white matter T2 hyperintensities, without midline shift or mass effect. Prominent supra and infratentorial perivascular spaces. No abnormal extra-axial fluid collections. Focal low T2, intermediate T1 focal signal along the anterior interhemispheric fissure corresponding to CT abnormality. ORBITS: The included ocular globes and orbital contents are non-suspicious. SINUSES: Mild paranasal sinus mucosal thickening. Bilateral maxillary sinus mucosal retention cyst. Mastoid air cells are well aerated. SKULL/SOFT TISSUES: No abnormal sellar  expansion. No suspicious calvarial bone marrow signal. Craniocervical junction maintained. MRA HEAD FINDINGS Moderately motion degraded examination. Anterior circulation: Normal flow related enhancement of the included cervical, petrous, cavernous and supraclinoid internal carotid arteries. Patent anterior communicating artery. 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 Probable retrograde flow into RIGHT A3 segment. Poor visualized RIGHT A2 segment. No large vessel occlusion, high-grade stenosis, abnormal luminal irregularity. Posterior circulation: LEFT vertebral artery is dominant. Basilar artery  is patent, with normal flow related enhancement of the main branch vessels. Robust RIGHT posterior communicating artery. Normal flow related enhancement of the posterior cerebral arteries. No large vessel occlusion, high-grade stenosis, abnormal luminal irregularity, aneurysm. IMPRESSION: MRI HEAD: No acute intracranial process. Susceptibility artifact LEFT caudate head could represent a cavernoma. Moderate chronic small vessel ischemic disease. MRA HEAD: Moderately motion degraded examination. 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 Poorly visualized RIGHT A2 segment. Recommend CTA head for further characterization. No emergent large vessel occlusion or high-grade stenosis though motion limits assessment. Electronically Signed   By: Elon Alas M.D.   On: 09/15/2015 02:56   Mr Jodene Nam Head/brain Wo Cm  09/15/2015  CLINICAL DATA:  Frontal headache beginning at 7 p.m. Mouth numbness. TIA 1 month ago. History of hypertension, hyperlipidemia. EXAM: MRI HEAD WITHOUT CONTRAST MRA HEAD WITHOUT CONTRAST TECHNIQUE: Multiplanar, multiecho pulse sequences of the brain and surrounding structures were obtained without intravenous contrast. Angiographic images of the head were obtained using MRA technique without contrast. COMPARISON:  CT head September 13, 2015 FINDINGS: MRI HEAD FINDINGS INTRACRANIAL CONTENTS: No reduced diffusion to suggest acute ischemia. Subcentimeter focus of susceptibility artifact LEFT caudate head without additional signal abnormality, possible cavernoma. Ventricles and sulci are normal for patient's age. Patchy pontine and scattered subcentimeter supratentorial white matter T2 hyperintensities, without midline shift or mass effect. Prominent supra and infratentorial perivascular spaces. No abnormal extra-axial fluid collections. Focal low T2, intermediate T1 focal signal along the anterior interhemispheric fissure corresponding to CT abnormality. ORBITS: The included  ocular globes and orbital contents are non-suspicious. SINUSES: Mild paranasal sinus mucosal thickening. Bilateral maxillary sinus mucosal retention cyst. Mastoid air cells are well aerated. SKULL/SOFT TISSUES: No abnormal sellar expansion. No suspicious calvarial bone marrow signal. Craniocervical junction maintained. MRA HEAD FINDINGS Moderately motion degraded examination. Anterior circulation: Normal flow related enhancement of the included cervical, petrous, cavernous and supraclinoid internal carotid arteries. Patent anterior communicating artery. 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 Probable retrograde flow into RIGHT A3 segment. Poor visualized RIGHT A2 segment. No large vessel occlusion, high-grade stenosis, abnormal luminal irregularity. Posterior circulation: LEFT vertebral artery is dominant. Basilar artery is patent, with normal flow related enhancement of the main branch vessels. Robust RIGHT posterior communicating artery. Normal flow related enhancement of the posterior cerebral arteries. No large vessel occlusion, high-grade stenosis, abnormal luminal irregularity, aneurysm. IMPRESSION: MRI HEAD: No acute intracranial process. Susceptibility artifact LEFT caudate head could represent a cavernoma. Moderate chronic small vessel ischemic disease. MRA HEAD: Moderately motion degraded examination. 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 Poorly visualized RIGHT A2 segment. Recommend CTA head for further characterization. No emergent large vessel occlusion or high-grade stenosis though motion limits assessment. Electronically Signed   By: Elon Alas M.D.   On: 09/15/2015 02:56     PERTINENT LAB RESULTS: CBC:  Recent Labs  09/14/15 2049 09/14/15 2056  WBC 12.1*  --  HGB 15.1 16.3  HCT 43.4 48.0  PLT 377  --    CMET CMP     Component Value Date/Time   NA 139 09/14/2015 2056   K 3.5 09/14/2015 2056   CL 101  09/14/2015 2056   CO2 24 09/14/2015 2049   GLUCOSE 215* 09/14/2015 2056   BUN 25* 09/14/2015 2056   CREATININE 1.10 09/14/2015 2056   CREATININE 0.83 08/24/2010 1141   CALCIUM 8.9 09/14/2015 2049   PROT 7.6 09/14/2015 2049   ALBUMIN 3.9 09/14/2015 2049   AST 21 09/14/2015 2049   ALT 26 09/14/2015 2049   ALKPHOS 74 09/14/2015 2049   BILITOT 0.7 09/14/2015 2049   GFRNONAA >60 09/14/2015 2049   GFRAA >60 09/14/2015 2049    GFR Estimated Creatinine Clearance: 85.5 mL/min (by C-G formula based on Cr of 1.1). No results for input(s): LIPASE, AMYLASE in the last 72 hours. No results for input(s): CKTOTAL, CKMB, CKMBINDEX, TROPONINI in the last 72 hours. Invalid input(s): POCBNP No results for input(s): DDIMER in the last 72 hours. No results for input(s): HGBA1C in the last 72 hours. No results for input(s): CHOL, HDL, LDLCALC, TRIG, CHOLHDL, LDLDIRECT in the last 72 hours. No results for input(s): TSH, T4TOTAL, T3FREE, THYROIDAB in the last 72 hours.  Invalid input(s): FREET3 No results for input(s): VITAMINB12, FOLATE, FERRITIN, TIBC, IRON, RETICCTPCT in the last 72 hours. Coags:  Recent Labs  09/14/15 2049  INR 1.10   Microbiology: Recent Results (from the past 240 hour(s))  Surgical pcr screen     Status: Abnormal   Collection Time: 09/15/15 11:34 PM  Result Value Ref Range Status   MRSA, PCR NEGATIVE NEGATIVE Final   Staphylococcus aureus POSITIVE (A) NEGATIVE Final    Comment:        The Xpert SA Assay (FDA approved for NASAL specimens in patients over 60 years of age), is one component of a comprehensive surveillance program.  Test performance has been validated by [facility] for patients greater than or equal to 58 year old. It is not intended to diagnose infection nor to guide or monitor treatment.      BRIEF HOSPITAL COURSE:  Headaches: Resolved.Suspect related to Migraines and not related to Aneurysm. Doubt TIA and doubt any need for TIA work up.ESR  only 27-doubt temporal arteritis. Continue Baclofen and depakote for prophylaxis. Have recommended outpatient neurology follow-up-ambulatory referral to defer neurology has been sent via Epic.  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 IR recommending initiating aspirin and Plavix on discharge, they will contact patient and bring the patient back early next week for definite treatment of this aneurysm. Patient and family aware of this plan.   Leukocytosis: Mild leukocytosis. Nontoxic appearing and afebrile. Continue to monitor off antibiotics.   Hyperlipidemia: Continue Lipitor  Essential hypertension: Controlled-but given cerebral aneurysm-will  increase metoprolol 100 mg twice a day. Continue current dosing of amlodipine, chlorthalidone, lisinopril   Type 2 diabetes mellitus: CBGs stable,with SSI. Does not appear to be on any oral hypoglycemics as an outpatient. Continue diet control.  Depression: Continue Lexapro.  TODAY-DAY OF DISCHARGE:  Subjective:   Aroldo Sawaya today has no headache,no chest abdominal pain,no new weakness tingling or numbness, feels much better wants to go home today.   Objective:   Blood pressure 143/74, pulse 81, temperature 97 F (36.1  C), temperature source Oral, resp. rate 18, height _0  (1.727 m), weight 111.7 kg (246 lb 4.1 oz), SpO2 97 %.  Intake/Output Summary (Last 24 hours) at 09/17/15 1123 Last data filed at 09/17/15 0700  Gross per 24 hour  Intake    720 ml  Output   1000 ml  Net   -280 ml   Filed Weights   09/14/15 2022 09/15/15 0018  Weight: 107.956 kg (238 lb) 111.7 kg (246 lb 4.1 oz)    Exam Awake Alert, Oriented *3, No new F.N deficits, Normal affect [place].AT,PERRAL Supple Neck,No JVD, No cervical lymphadenopathy appriciated.  Symmetrical Chest wall  movement, Good air movement bilaterally, CTAB RRR,No Gallops,Rubs or new Murmurs, No Parasternal Heave +ve B.Sounds, Abd Soft, Non tender, No organomegaly appriciated, No rebound -guarding or rigidity. No Cyanosis, Clubbing or edema, No new Rash or bruise  DISCHARGE CONDITION: Stable  DISPOSITION: Home  DISCHARGE INSTRUCTIONS:    Activity:  As tolerated   Get Medicines reviewed and adjusted: Please take all your medications with you for your next visit with your Primary MD  Please request your Primary MD to go over all hospital tests and procedure/radiological results at the follow up, please ask your Primary MD to get all Hospital records sent to his/her office.  If you experience worsening of your admission symptoms, develop shortness of breath, life threatening emergency, suicidal or homicidal thoughts you must seek medical attention immediately by calling 911 or calling your MD immediately  if symptoms less severe.  You must read complete instructions/literature along with all the possible adverse reactions/side effects for all the Medicines you take and that have been prescribed to you. Take any new Medicines after you have completely understood and accpet all the possible adverse reactions/side effects.   Do not drive when taking Pain medications.   Do not take more than prescribed Pain, Sleep and Anxiety Medications  Special Instructions: If you have smoked or chewed Tobacco  in the last 2 yrs please stop smoking, stop any regular Alcohol  and or any Recreational drug use.  Wear Seat belts while driving.  Please note  You were cared for by a hospitalist during your hospital stay. Once you are discharged, your primary care physician will handle any further medical issues. Please note that NO REFILLS for any discharge medications will be authorized once you are discharged, as it is imperative that you return to your primary care physician (or establish a relationship with a  primary care physician if you do not have one) for your aftercare needs so that they can reassess your need for medications and monitor your lab values.   Diet recommendation: Heart Healthy diet  Discharge Instructions    Ambulatory referral to Neurology    Complete by:  As directed   An appointment is requested in approximately: 2 weeks     Call MD for:  persistant dizziness or light-headedness    Complete by:  As directed      Call MD for:  severe uncontrolled pain    Complete by:  As directed      Diet - low sodium heart healthy    Complete by:  As directed      Increase activity slowly    Complete by:  As directed            Follow-up Information    Follow up with Berkley Harvey, NP. Schedule an appointment as soon as possible for a visit in 1 week.  Specialty:  Nurse Practitioner   Why:  Hospital follow up/////----09/23/15 at 9:45 a.m.   Contact information:   [place] [address] [phone]       Follow up with DEVESHWAR, Fritz Pickerel, MD.   Specialty:  Interventional Radiology   Why:  Office will call with date/time-for procedure early next week   Contact information:   [address] [place] [address] [phone]       Follow up with [facility].   Why:  Office will call with date/time, If you do not hear from them in 1 week,please giv   Contact information:   [address]     [place] [id number] [phone]     Total Time spent on discharge equals 45 minutes.  SignedOren Binet 09/17/2015 11:23 AM

## 2015-09-17 NOTE — Evaluation (Signed)
Speech Language Pathology Evaluation Patient Details Name: JARRIUS SCHOFF MRN: [id number] DOB: 1955-11-08 Today's Date: 09/17/2015 Time: 0930-1003 SLP Time Calculation (min) (ACUTE ONLY): 33 min  Problem List:  Patient Active Problem List   Diagnosis Date Noted  . Type 2 diabetes mellitus ([place]) 09/15/2015  . Leukocytosis 09/15/2015  . TIA (transient ischemic attack) 09/14/2015  . History of tear of ACL (anterior cruciate ligament) 03/08/2011  . Depression 09/07/2010  . URI (upper respiratory infection) 08/24/2010  . DECREASED HEARING, LEFT EAR 09/06/2006  . Hyperlipidemia 04/23/2006  . Essential hypertension 04/23/2006  . INSOMNIA 04/23/2006   Past Medical History:  Past Medical History  Diagnosis Date  . Chest pain, atypical     12/2003:  negative cardiolyte  . HTN (hypertension)   . HLD (hyperlipidemia)   . Depression   . Hx of tear of ACL (anterior cruciate ligament)     right  . History of meniscal tear     bilateral  . Erectile dysfunction   . Insomnia   . Eczema   . Olecranon bursitis of left elbow 10/2009    s/p I&D by Dr Maxie Better, initially assessed by Dr. Nori Riis   . Allergy   . Anxiety   . Cataract   . Prediabetes   . TIA (transient ischemic attack)    Past Surgical History:  Past Surgical History  Procedure Laterality Date  . Skin tag removal      11 removed  . Knee arthroscopy    . Incise and drain abcess      L elbow due to cellulitis/bursitis  . Inner ear surgery     HPI:  Patient is a 60 year old male with remote history of migraine headaches, hypertension, diabetes, and TIA in March 2017 who presented to the ED on 4/18 for worsening headache and right facial numbness. Per patient, his headache was throbbing, 8/10 at its worst, associated with some nausea and had zigzag lines in his visual field. ED obtained head CT showing a hyperdense 1.3x0.6 cm lesion along the anterior falx. MRI brain was negative for CVA, however MRA brain showed a 8x6 mm saccular  aneurysm at the left A2-3 junction. Neurology and IR consulted. Underwent cerebral angiogram on 4/20   Assessment / Plan / Recommendation Clinical Impression  Pt participated with standardized cognitive assessment using the [facility] Cognitive Assessment 7.1). Pt scored 26/30 which is WNL. Pt reported feeling somewhat distracted/anxious with current medical situation. Speech and language were WNL. No further SLP services indicated at this time. Pt is in agreement.    SLP Assessment  Patient does not need any further Speech Lanaguage Pathology Services    Follow Up Recommendations  None    Frequency and Duration           SLP Evaluation Prior Functioning  Cognitive/Linguistic Baseline: Within functional limits Vocation: Full time employment   Cognition  Overall Cognitive Status: Within Functional Limits for tasks assessed Arousal/Alertness: Awake/alert Orientation Level: Oriented X4 Memory: Appears intact Awareness: Appears intact Problem Solving: Appears intact Safety/Judgment: Appears intact    Comprehension  Auditory Comprehension Overall Auditory Comprehension: Appears within functional limits for tasks assessed    Expression Expression Primary Mode of Expression: Verbal Verbal Expression Overall Verbal Expression: Appears within functional limits for tasks assessed Written Expression Dominant Hand: Right   Oral / Motor  Oral Motor/Sensory Function Overall Oral Motor/Sensory Function: Within functional limits Motor Speech Overall Motor Speech: Appears within functional limits for tasks assessed   GO  Functional Assessment Tool Used: clinican judgement Functional Limitations: Spoken language comprehension Spoken Language Comprehension Current Status [phone]): 0 percent impaired, limited or restricted Spoken Language Comprehension Goal Status [id number] 0 percent impaired, limited or restricted Spoken Language Comprehension Discharge Status [phone]): 0  percent impaired, limited or restricted         Vinetta Bergamo MA, CCC-SLP 09/17/2015, 10:17 AM

## 2015-09-17 NOTE — Progress Notes (Signed)
Patient is being d/c home. D/C instructions given and patient verbalized understanding. Awaiting ride at this time.

## 2015-09-17 NOTE — Progress Notes (Signed)
Pt discharged to home at 1219, escorted to front lobby via wheelchair. Daughter to transport pt home.

## 2015-09-20 ENCOUNTER — Encounter (HOSPITAL_COMMUNITY)
Admission: AD | Disposition: A | Discharge status: Rehabilitation Facility | Payer: Self-pay | Source: Ambulatory Visit | Attending: Interventional Radiology

## 2015-09-20 ENCOUNTER — Encounter (HOSPITAL_COMMUNITY): Payer: Self-pay | Admitting: General Practice

## 2015-09-20 ENCOUNTER — Inpatient Hospital Stay (HOSPITAL_COMMUNITY)
Admission: AD | Admit: 2015-09-20 | Discharge: 2015-09-23 | DRG: 025 | Disposition: A | Discharge status: Rehabilitation Facility | Payer: Managed Care, Other (non HMO) | Source: Ambulatory Visit | Attending: Interventional Radiology | Admitting: Interventional Radiology

## 2015-09-20 ENCOUNTER — Ambulatory Visit (HOSPITAL_COMMUNITY): Payer: Managed Care, Other (non HMO) | Admitting: Anesthesiology

## 2015-09-20 ENCOUNTER — Inpatient Hospital Stay (HOSPITAL_COMMUNITY): Payer: Managed Care, Other (non HMO)

## 2015-09-20 ENCOUNTER — Ambulatory Visit (HOSPITAL_COMMUNITY)
Admission: RE | Admit: 2015-09-20 | Discharge: 2015-09-20 | Disposition: A | Discharge status: Home / Self Care | Payer: Managed Care, Other (non HMO) | Source: Ambulatory Visit | Attending: Interventional Radiology | Admitting: Interventional Radiology

## 2015-09-20 DIAGNOSIS — G8324 Monoplegia of upper limb affecting left nondominant side: Secondary | ICD-10-CM | POA: Diagnosis not present

## 2015-09-20 DIAGNOSIS — F411 Generalized anxiety disorder: Secondary | ICD-10-CM | POA: Diagnosis not present

## 2015-09-20 DIAGNOSIS — H9190 Unspecified hearing loss, unspecified ear: Secondary | ICD-10-CM | POA: Diagnosis present

## 2015-09-20 DIAGNOSIS — R7303 Prediabetes: Secondary | ICD-10-CM | POA: Diagnosis present

## 2015-09-20 DIAGNOSIS — F419 Anxiety disorder, unspecified: Secondary | ICD-10-CM | POA: Diagnosis present

## 2015-09-20 DIAGNOSIS — R4781 Slurred speech: Secondary | ICD-10-CM | POA: Diagnosis not present

## 2015-09-20 DIAGNOSIS — G4700 Insomnia, unspecified: Secondary | ICD-10-CM | POA: Diagnosis present

## 2015-09-20 DIAGNOSIS — R200 Anesthesia of skin: Secondary | ICD-10-CM

## 2015-09-20 DIAGNOSIS — Z8673 Personal history of transient ischemic attack (TIA), and cerebral infarction without residual deficits: Secondary | ICD-10-CM | POA: Diagnosis not present

## 2015-09-20 DIAGNOSIS — I69398 Other sequelae of cerebral infarction: Secondary | ICD-10-CM | POA: Diagnosis not present

## 2015-09-20 DIAGNOSIS — Z7951 Long term (current) use of inhaled steroids: Secondary | ICD-10-CM | POA: Diagnosis not present

## 2015-09-20 DIAGNOSIS — I63411 Cerebral infarction due to embolism of right middle cerebral artery: Secondary | ICD-10-CM | POA: Diagnosis not present

## 2015-09-20 DIAGNOSIS — I671 Cerebral aneurysm, nonruptured: Principal | ICD-10-CM | POA: Diagnosis present

## 2015-09-20 DIAGNOSIS — Z7902 Long term (current) use of antithrombotics/antiplatelets: Secondary | ICD-10-CM | POA: Diagnosis not present

## 2015-09-20 DIAGNOSIS — I63421 Cerebral infarction due to embolism of right anterior cerebral artery: Secondary | ICD-10-CM | POA: Insufficient documentation

## 2015-09-20 DIAGNOSIS — R29810 Facial weakness: Secondary | ICD-10-CM | POA: Diagnosis not present

## 2015-09-20 DIAGNOSIS — Z6837 Body mass index (BMI) 37.0-37.9, adult: Secondary | ICD-10-CM

## 2015-09-20 DIAGNOSIS — R471 Dysarthria and anarthria: Secondary | ICD-10-CM | POA: Diagnosis not present

## 2015-09-20 DIAGNOSIS — R29703 NIHSS score 3: Secondary | ICD-10-CM | POA: Diagnosis present

## 2015-09-20 DIAGNOSIS — G43109 Migraine with aura, not intractable, without status migrainosus: Secondary | ICD-10-CM | POA: Diagnosis present

## 2015-09-20 DIAGNOSIS — R269 Unspecified abnormalities of gait and mobility: Secondary | ICD-10-CM | POA: Diagnosis present

## 2015-09-20 DIAGNOSIS — Z8249 Family history of ischemic heart disease and other diseases of the circulatory system: Secondary | ICD-10-CM | POA: Diagnosis not present

## 2015-09-20 DIAGNOSIS — I639 Cerebral infarction, unspecified: Secondary | ICD-10-CM

## 2015-09-20 DIAGNOSIS — I97821 Postprocedural cerebrovascular infarction during other surgery: Secondary | ICD-10-CM | POA: Diagnosis not present

## 2015-09-20 DIAGNOSIS — F329 Major depressive disorder, single episode, unspecified: Secondary | ICD-10-CM | POA: Diagnosis present

## 2015-09-20 DIAGNOSIS — G43909 Migraine, unspecified, not intractable, without status migrainosus: Secondary | ICD-10-CM | POA: Diagnosis not present

## 2015-09-20 DIAGNOSIS — I10 Essential (primary) hypertension: Secondary | ICD-10-CM | POA: Diagnosis present

## 2015-09-20 DIAGNOSIS — K5900 Constipation, unspecified: Secondary | ICD-10-CM | POA: Diagnosis present

## 2015-09-20 DIAGNOSIS — Z833 Family history of diabetes mellitus: Secondary | ICD-10-CM | POA: Diagnosis not present

## 2015-09-20 DIAGNOSIS — E785 Hyperlipidemia, unspecified: Secondary | ICD-10-CM | POA: Diagnosis present

## 2015-09-20 DIAGNOSIS — Z79899 Other long term (current) drug therapy: Secondary | ICD-10-CM

## 2015-09-20 DIAGNOSIS — I6789 Other cerebrovascular disease: Secondary | ICD-10-CM | POA: Diagnosis not present

## 2015-09-20 DIAGNOSIS — R0682 Tachypnea, not elsewhere classified: Secondary | ICD-10-CM | POA: Insufficient documentation

## 2015-09-20 DIAGNOSIS — D62 Acute posthemorrhagic anemia: Secondary | ICD-10-CM | POA: Insufficient documentation

## 2015-09-20 DIAGNOSIS — Z7982 Long term (current) use of aspirin: Secondary | ICD-10-CM

## 2015-09-20 DIAGNOSIS — R51 Headache: Secondary | ICD-10-CM | POA: Diagnosis present

## 2015-09-20 HISTORY — PX: RADIOLOGY WITH ANESTHESIA: SHX6223

## 2015-09-20 LAB — BASIC METABOLIC PANEL
Anion gap: 12 (ref 5–15)
BUN: 16 mg/dL (ref 6–20)
CHLORIDE: 104 mmol/L (ref 101–111)
CO2: 24 mmol/L (ref 22–32)
CREATININE: 0.94 mg/dL (ref 0.61–1.24)
Calcium: 9.1 mg/dL (ref 8.9–10.3)
Glucose, Bld: 104 mg/dL — ABNORMAL HIGH (ref 65–99)
Potassium: 4 mmol/L (ref 3.5–5.1)
SODIUM: 140 mmol/L (ref 135–145)

## 2015-09-20 LAB — PROTIME-INR
INR: 0.99 (ref 0.00–1.49)
PROTHROMBIN TIME: 13.3 s (ref 11.6–15.2)

## 2015-09-20 LAB — CBC WITH DIFFERENTIAL/PLATELET
BASOS ABS: 0.1 10*3/uL (ref 0.0–0.1)
BASOS PCT: 1 %
EOS ABS: 0.7 10*3/uL (ref 0.0–0.7)
EOS PCT: 6 %
HCT: 41.5 % (ref 39.0–52.0)
HEMOGLOBIN: 13.7 g/dL (ref 13.0–17.0)
Lymphocytes Relative: 22 %
Lymphs Abs: 2.6 10*3/uL (ref 0.7–4.0)
MCH: 28.9 pg (ref 26.0–34.0)
MCHC: 33 g/dL (ref 30.0–36.0)
MCV: 87.6 fL (ref 78.0–100.0)
Monocytes Absolute: 0.9 10*3/uL (ref 0.1–1.0)
Monocytes Relative: 8 %
NEUTROS PCT: 63 %
Neutro Abs: 7.5 10*3/uL (ref 1.7–7.7)
PLATELETS: 306 10*3/uL (ref 150–400)
RBC: 4.74 MIL/uL (ref 4.22–5.81)
RDW: 13.7 % (ref 11.5–15.5)
WBC: 11.8 10*3/uL — AB (ref 4.0–10.5)

## 2015-09-20 LAB — POCT ACTIVATED CLOTTING TIME
ACTIVATED CLOTTING TIME: 167 s
ACTIVATED CLOTTING TIME: 199 s
Activated Clotting Time: 204 seconds

## 2015-09-20 LAB — GLUCOSE, CAPILLARY
GLUCOSE-CAPILLARY: 98 mg/dL (ref 65–99)
GLUCOSE-CAPILLARY: 83 mg/dL (ref 65–99)

## 2015-09-20 LAB — PLATELET INHIBITION P2Y12: PLATELET FUNCTION P2Y12: 158 [PRU] — AB (ref 194–418)

## 2015-09-20 LAB — APTT: APTT: 26 s (ref 24–37)

## 2015-09-20 SURGERY — RADIOLOGY WITH ANESTHESIA
Anesthesia: General

## 2015-09-20 MED ORDER — HEPARIN SODIUM (PORCINE) 1000 UNIT/ML IJ SOLN
INTRAMUSCULAR | Status: DC | PRN
  Administered 2015-09-20: 3000 [IU] via INTRAVENOUS
  Administered 2015-09-20: 500 [IU] via INTRAVENOUS

## 2015-09-20 MED ORDER — SUGAMMADEX SODIUM 200 MG/2ML IV SOLN
INTRAVENOUS | Status: DC | PRN
  Administered 2015-09-20: 200 mg via INTRAVENOUS

## 2015-09-20 MED ORDER — FENTANYL CITRATE (PF) 100 MCG/2ML IJ SOLN
INTRAMUSCULAR | Status: DC | PRN
  Administered 2015-09-20: 50 ug via INTRAVENOUS
  Administered 2015-09-20: 100 ug via INTRAVENOUS

## 2015-09-20 MED ORDER — LACTATED RINGERS IV SOLN
INTRAVENOUS | Status: DC | PRN
  Administered 2015-09-20 (×2): via INTRAVENOUS

## 2015-09-20 MED ORDER — NITROGLYCERIN 5 MG/ML IV SOLN
25.0000 ug | INTRAVENOUS | Status: AC | PRN
Start: 2015-09-20 — End: 2015-09-20
  Administered 2015-09-20: 25 ug via INTRA_ARTERIAL

## 2015-09-20 MED ORDER — METOPROLOL TARTRATE 50 MG PO TABS
ORAL_TABLET | ORAL | Status: AC
  Filled 2015-09-20: qty 2

## 2015-09-20 MED ORDER — NIMODIPINE 30 MG PO CAPS: 0.0000 mg | ORAL_CAPSULE | ORAL | Status: DC

## 2015-09-20 MED ORDER — ASPIRIN EC 325 MG PO TBEC
325.0000 mg | DELAYED_RELEASE_TABLET | ORAL | Status: AC
  Administered 2015-09-20: 325 mg via ORAL
  Filled 2015-09-20 (×2): qty 1

## 2015-09-20 MED ORDER — HYDROMORPHONE HCL 1 MG/ML IJ SOLN: 0.2500 mg | INTRAMUSCULAR | Status: DC | PRN

## 2015-09-20 MED ORDER — METOPROLOL TARTRATE 100 MG PO TABS
100.0000 mg | ORAL_TABLET | Freq: Once | ORAL | Status: AC
  Administered 2015-09-20: 100 mg via ORAL
  Filled 2015-09-20: qty 1

## 2015-09-20 MED ORDER — SODIUM CHLORIDE 0.9 % IV SOLN
INTRAVENOUS | Status: DC
  Administered 2015-09-20 – 2015-09-21 (×2): via INTRAVENOUS

## 2015-09-20 MED ORDER — LACTATED RINGERS IV SOLN
INTRAVENOUS | Status: DC
  Administered 2015-09-20 (×2): via INTRAVENOUS

## 2015-09-20 MED ORDER — ESCITALOPRAM OXALATE 10 MG PO TABS
10.0000 mg | ORAL_TABLET | Freq: Every day | ORAL | Status: DC
  Filled 2015-09-20: qty 1

## 2015-09-20 MED ORDER — EPHEDRINE SULFATE 50 MG/ML IJ SOLN
INTRAMUSCULAR | Status: DC | PRN
  Administered 2015-09-20 (×2): 5 mg via INTRAVENOUS

## 2015-09-20 MED ORDER — ACETAMINOPHEN 500 MG PO TABS
1000.0000 mg | ORAL_TABLET | Freq: Four times a day (QID) | ORAL | Status: DC | PRN
  Administered 2015-09-21 (×2): 1000 mg via ORAL
  Filled 2015-09-20 (×3): qty 2

## 2015-09-20 MED ORDER — SODIUM CHLORIDE 0.9 % IV SOLN
INTRAVENOUS | Status: DC
  Administered 2015-09-20: 22:00:00 via INTRAVENOUS

## 2015-09-20 MED ORDER — CEFAZOLIN SODIUM-DEXTROSE 2-4 GM/100ML-% IV SOLN
2.0000 g | INTRAVENOUS | Status: AC
  Administered 2015-09-20: 2 g via INTRAVENOUS
  Filled 2015-09-20: qty 100

## 2015-09-20 MED ORDER — LIDOCAINE HCL (PF) 1 % IJ SOLN
INTRAMUSCULAR | Status: AC
  Filled 2015-09-20: qty 30

## 2015-09-20 MED ORDER — ACETAMINOPHEN 650 MG RE SUPP: 650.0000 mg | Freq: Four times a day (QID) | RECTAL | Status: DC | PRN

## 2015-09-20 MED ORDER — CLOPIDOGREL BISULFATE 75 MG PO TABS
75.0000 mg | ORAL_TABLET | Freq: Every day | ORAL | Status: DC
  Administered 2015-09-21 – 2015-09-23 (×3): 75 mg via ORAL
  Filled 2015-09-20 (×3): qty 1

## 2015-09-20 MED ORDER — NITROGLYCERIN 1 MG/10 ML FOR IR/CATH LAB
INTRA_ARTERIAL | Status: AC
  Filled 2015-09-20: qty 10

## 2015-09-20 MED ORDER — ROCURONIUM BROMIDE 100 MG/10ML IV SOLN
INTRAVENOUS | Status: DC | PRN
  Administered 2015-09-20: 50 mg via INTRAVENOUS
  Administered 2015-09-20 (×3): 10 mg via INTRAVENOUS

## 2015-09-20 MED ORDER — CHLORTHALIDONE 25 MG PO TABS
25.0000 mg | ORAL_TABLET | Freq: Every day | ORAL | Status: DC
  Filled 2015-09-20: qty 1

## 2015-09-20 MED ORDER — ONDANSETRON HCL 4 MG/2ML IJ SOLN
INTRAMUSCULAR | Status: DC | PRN
  Administered 2015-09-20: 4 mg via INTRAVENOUS

## 2015-09-20 MED ORDER — PHENYLEPHRINE HCL 10 MG/ML IJ SOLN
10.0000 mg | INTRAVENOUS | Status: DC | PRN
  Administered 2015-09-20: 15 ug/min via INTRAVENOUS

## 2015-09-20 MED ORDER — CLOPIDOGREL BISULFATE 75 MG PO TABS
75.0000 mg | ORAL_TABLET | ORAL | Status: AC
  Administered 2015-09-20: 75 mg via ORAL
  Filled 2015-09-20: qty 1

## 2015-09-20 MED ORDER — NICARDIPINE HCL IN NACL 20-0.86 MG/200ML-% IV SOLN
5.0000 mg/h | INTRAVENOUS | Status: DC
  Administered 2015-09-20: 5 mg/h via INTRAVENOUS
  Filled 2015-09-20: qty 200

## 2015-09-20 MED ORDER — LABETALOL HCL 5 MG/ML IV SOLN
INTRAVENOUS | Status: DC | PRN
  Administered 2015-09-20: 5 mg via INTRAVENOUS

## 2015-09-20 MED ORDER — LIDOCAINE HCL (CARDIAC) 20 MG/ML IV SOLN
INTRAVENOUS | Status: DC | PRN
  Administered 2015-09-20: 80 mg via INTRAVENOUS

## 2015-09-20 MED ORDER — ONDANSETRON HCL 4 MG/2ML IJ SOLN: 4.0000 mg | Freq: Four times a day (QID) | INTRAMUSCULAR | Status: DC | PRN

## 2015-09-20 MED ORDER — SODIUM CHLORIDE 0.9 % IV SOLN: INTRAVENOUS | Status: DC

## 2015-09-20 MED ORDER — BACLOFEN 10 MG PO TABS
10.0000 mg | ORAL_TABLET | Freq: Every day | ORAL | Status: DC
  Filled 2015-09-20 (×2): qty 1

## 2015-09-20 MED ORDER — LABETALOL HCL 5 MG/ML IV SOLN
INTRAVENOUS | Status: DC
Start: 2015-09-20 — End: 2015-09-21
  Filled 2015-09-20: qty 4

## 2015-09-20 MED ORDER — PROPOFOL 10 MG/ML IV BOLUS
INTRAVENOUS | Status: DC | PRN
Start: 2015-09-20 — End: 2015-09-20
  Administered 2015-09-20: 180 mg via INTRAVENOUS

## 2015-09-20 MED ORDER — DIVALPROEX SODIUM 500 MG PO DR TAB
500.0000 mg | DELAYED_RELEASE_TABLET | Freq: Two times a day (BID) | ORAL | Status: DC
  Filled 2015-09-20 (×2): qty 1

## 2015-09-20 MED ORDER — IOPAMIDOL (ISOVUE-300) INJECTION 61%
INTRAVENOUS | Status: AC
  Filled 2015-09-20: qty 150

## 2015-09-20 MED ORDER — ASPIRIN 325 MG PO TABS
325.0000 mg | ORAL_TABLET | Freq: Every day | ORAL | Status: DC
  Administered 2015-09-21: 325 mg via ORAL
  Filled 2015-09-20: qty 1

## 2015-09-20 MED ORDER — IOPAMIDOL (ISOVUE-300) INJECTION 61%
INTRAVENOUS | Status: AC
  Filled 2015-09-20: qty 100

## 2015-09-20 MED ORDER — HEPARIN (PORCINE) IN NACL 100-0.45 UNIT/ML-% IJ SOLN
1000.0000 [IU]/h | INTRAMUSCULAR | Status: AC
  Administered 2015-09-20: 750 [IU]/h via INTRAVENOUS
  Filled 2015-09-20: qty 250

## 2015-09-20 MED ORDER — MIDAZOLAM HCL 5 MG/5ML IJ SOLN
INTRAMUSCULAR | Status: DC | PRN
  Administered 2015-09-20: 2 mg via INTRAVENOUS

## 2015-09-20 MED ORDER — IOPAMIDOL (ISOVUE-370) INJECTION 76%
INTRAVENOUS | Status: AC
  Administered 2015-09-20: 50 mL
  Filled 2015-09-20: qty 50

## 2015-09-20 NOTE — Progress Notes (Signed)
On 71M rounding, alerted by 71M staff to see patient in [id number] for new onset of left sided facial droop and left thumb weakness.Patient recently back from IR status post  RT CCA angiogram with endovascular treatment. Upon assessment patient had NIH of 2 with noted left sided facial palsy. Patient LKW was 2108 with the primary RN and family present at bedside. BP 118/56, 99 % RA. CODE Stroke activated at this time.  Patient transported to CT with RRT team and Primary RN.  Neuro MD in route for further eval and attending MD.

## 2015-09-20 NOTE — Progress Notes (Signed)
@  2108 was notified by daughter that patient was unable to move left thumb and had numbness of left hand. Assessed patient and noticed a prominent left facial droop and dysarthria witnessed by this RN and daughter. Rapid RN rounding on unit, called code stroke, and notified Deveshwar of new symptoms. Symptoms were already improving within 10 minutes. Patient taken to CT and evaluated by Dr Nicole Kindred and was already neuro intact. CT/CTA Head completed. Dr Estanislado Pandy in CT to assess patient and review scans. New orders received from [facility] and completed. Daughter updated on plan of care by MD. Will continue to monitor.

## 2015-09-20 NOTE — Progress Notes (Signed)
Patient ID: [name], male   DOB: 08-Jul-1955, 60 y.o.   MRN: [id number] INR. Informed of patients new symptoms of lt hand weakness and prominent Lt facial droop at approx 9 10 pm..Patieny did Not c/O a HA/N/V  Though had dysarthria. Code stroke was initiated . Patient seen and evaluated by  Dr Nicole Kindred. By the time patient was in the [place] had started resolving to complete normalcy in approx 15 mins from onset. Ct scan reviewed .Reveals no acute hemorrhage . CTA head and neck  demonstrates no occlusions or filling defects intr or extracranially.  Presently patient has returned to his baseline neuro state.Marland Kitchen UEs reveal symmetric strength.No facial asymmetrynoted. Plan. 1.Bolus 500 cc NS x1. 2.Titrate  IV cardene to maintain BP of [id number] systolic . 3. Continue  IV heparin .. S. Dax Murguia MD

## 2015-09-20 NOTE — Anesthesia Procedure Notes (Signed)
Procedure Name: Intubation Date/Time: 09/20/2015 12:47 PM Performed by: Neldon Newport Pre-anesthesia Checklist: Timeout performed, Patient being monitored, Suction available, Emergency Drugs available and Patient identified Patient Re-evaluated:Patient Re-evaluated prior to inductionOxygen Delivery Method: Circle system utilized Preoxygenation: Pre-oxygenation with 100% oxygen Intubation Type: IV induction Ventilation: Mask ventilation without difficulty Laryngoscope Size: McGraph and 3 Grade View: Grade I Tube type: Oral Tube size: 7.5 mm Number of attempts: 1 Placement Confirmation: positive ETCO2,  ETT inserted through vocal cords under direct vision and breath sounds checked- equal and bilateral Secured at: 23 cm Tube secured with: Tape Dental Injury: Teeth and Oropharynx as per pre-operative assessment

## 2015-09-20 NOTE — Transfer of Care (Signed)
Immediate Anesthesia Transfer of Care Note  Patient: [name]  Procedure(s) Performed: Procedure(s): EMBOLIZATION         (RADIOLOGY WITH ANESTHESIA) (N/A)  Patient Location: PACU  Anesthesia Type:General  Level of Consciousness: awake, alert , oriented and patient cooperative  Airway & Oxygen Therapy: Patient Spontanous Breathing and Patient connected to face mask oxygen  Post-op Assessment: Report given to RN, Post -op Vital signs reviewed and stable and Patient moving all extremities X 4  Post vital signs: Reviewed and stable  Last Vitals:  Filed Vitals:   09/20/15 0904  BP: 117/71  Pulse: 72  Temp: 37.1 C  Resp: 18    Complications: No apparent anesthesia complications

## 2015-09-20 NOTE — Progress Notes (Signed)
ANTICOAGULATION CONSULT NOTE - Initial Consult  Pharmacy Consult for heparin  Indication: post intervention  No Known Allergies  Patient Measurements: Height: 5\' 8"  (172.7 cm) Weight: 246 lb 4 oz (111.698 kg) IBW/kg (Calculated) : 68.4 Heparin Dosing Weight: 93kg  Vital Signs: Temp: 97.8 F (36.6 C) (04/24 1553) Temp Source: Oral (04/24 0904) BP: 119/71 mmHg (04/24 1548) Pulse Rate: 76 (04/24 1548)  Labs:  Recent Labs  09/20/15 0905  HGB 13.7  HCT 41.5  PLT 306  APTT 26  LABPROT 13.3  INR 0.99  CREATININE 0.94    Estimated Creatinine Clearance: 100 mL/min (by C-G formula based on Cr of 0.94).   Medical History: Past Medical History  Diagnosis Date  . Chest pain, atypical     12/2003:  negative cardiolyte  . HTN (hypertension)   . HLD (hyperlipidemia)   . Depression   . Hx of tear of ACL (anterior cruciate ligament)     right  . History of meniscal tear     bilateral  . Erectile dysfunction   . Insomnia   . Eczema   . Olecranon bursitis of left elbow 10/2009    s/p I&D by Dr Maxie Better, initially assessed by Dr. Nori Riis   . Allergy   . Anxiety   . Cataract   . Prediabetes   . TIA (transient ischemic attack)      Assessment: 60 yo male s/p CCA angiogram and s/p endovascular aneurysm treatment. Heparin is ordered to run at 500 units/hr -Hg= 13.7, plt= 306  Goal of Therapy:  aPTT 50-60 seconds  Heparin level 0.1-0.25 Monitor platelets by anticoagulation protocol: Yes   Plan:  -Increase heparin to 750 units/hr  -heparin level in 6 hrs -Heparin to be off at [place], Pharm D 09/20/2015 4:12 PM    -Increase heparin to

## 2015-09-20 NOTE — Anesthesia Preprocedure Evaluation (Addendum)
Anesthesia Evaluation  Patient identified by MRN, date of birth, ID band Patient awake    Reviewed: Allergy & Precautions, NPO status , Patient's Chart, lab work & pertinent test results  Airway Mallampati: I  TM Distance: >3 FB Neck ROM: Full    Dental  (+) Teeth Intact, Dental Advisory Given   Pulmonary neg pulmonary ROS,    breath sounds clear to auscultation       Cardiovascular hypertension,  Rhythm:Regular Rate:Normal     Neuro/Psych TIA   GI/Hepatic negative GI ROS, Neg liver ROS,   Endo/Other  diabetesMorbid obesity  Renal/GU negative Renal ROS     Musculoskeletal   Abdominal (+) + obese,   Peds  Hematology   Anesthesia Other Findings   Reproductive/Obstetrics                            Anesthesia Physical Anesthesia Plan  ASA: III  Anesthesia Plan: General   Post-op Pain Management:    Induction: Intravenous  Airway Management Planned: Oral ETT  Additional Equipment:   Intra-op Plan:   Post-operative Plan: Extubation in OR  Informed Consent: I have reviewed the patients History and Physical, chart, labs and discussed the procedure including the risks, benefits and alternatives for the proposed anesthesia with the patient or authorized representative who has indicated his/her understanding and acceptance.   Dental advisory given  Plan Discussed with: CRNA and Surgeon  Anesthesia Plan Comments:         Anesthesia Quick Evaluation

## 2015-09-20 NOTE — Progress Notes (Signed)
Day of Surgery  Subjective: Post procedure . Denies any H/As,N,V speech difficulties ,motor weakness or sensory symptoms  or swallowing difficulties. Denies any chest pains or SOB  Objective: Vital signs in last 24 hours: Temp:  [97.8 F (36.6 C)-98.8 F (37.1 C)] 97.8 F (36.6 C) (04/24 1553) Pulse Rate:  [72-76] 76 (04/24 1548) Resp:  [18-19] 19 (04/24 1548) BP: (117-119)/(71) 119/71 mmHg (04/24 1548) SpO2:  [98 %-100 %] 100 % (04/24 1548) Arterial Line BP: (70)/(57) 70/57 mmHg (04/24 1548) Weight:  [246 lb 4 oz (111.698 kg)] 246 lb 4 oz (111.698 kg) (04/24 0904)    Intake/Output from previous day:   Intake/Output this shift: Total I/O In: -  Out: 580 [Urine:550; Blood:30]  On examination . VSS BP 130s/70s.HR 70 to 80s SR . PAO2 >95 % on 2Ls O2 [place].  Alert, awake oriented to time ,place and space.Marland Kitchen  Speech and comprehension clear.Marland Kitchen  PEARLA..pupils 2.5  To 59mm    EOMS full..No nystagmus or subjective diplopia.  Visual Fields.Full to confrontation  No Facial asymmetry.  Tongue Midline..  Motor..           NO drift of outstretched arms..          Power.5/5 Proximally and distally all four extremities..  Fine motor and coordination to finger to nose equal..  Gait Not tested  Romberg Not tested  Heel to toe.Not tested.  RT groin soft No palpable hematoma.  Lab Results:   Recent Labs  09/20/15 0905  WBC 11.8*  HGB 13.7  HCT 41.5  PLT 306   BMET  Recent Labs  09/20/15 0905  NA 140  K 4.0  CL 104  CO2 24  GLUCOSE 104*  BUN 16  CREATININE 0.94  CALCIUM 9.1   PT/INR  Recent Labs  09/20/15 0905  LABPROT 13.3  INR 0.99   ABG No results for input(s): PHART, HCO3 in the last 72 hours.  Invalid input(s): PCO2, PO2  Studies/Results: No results found.  Anti-infectives: Anti-infectives    None      Assessment/Plan:.   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 Plan . 1.IV heprin ,and  close BP and neurological monitoring . 2.Advance to clear liquids as tolerated. 3.Raise HOB 30 degrees. 4.D/W patient.  Addendum. Patient reporter mild numbness in his middle ,ring and little fingers of his left hand.No associated  Neurological findings noted Denies any H/As,N/V or speech difficulties.. VS stable . Will continue with  plan as above. D/W RN in  PACU.Marland Kitchen S.Koben Daman MD .      Luanne Bras K 09/20/2015

## 2015-09-20 NOTE — Progress Notes (Signed)
Bleeding noted around penis at the foley catheter insert area, catheter inserted prior to this RN assuming patient care, no report given from previous RN in regards to bleeding around foley site. Will report of to PACU

## 2015-09-20 NOTE — Procedures (Signed)
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

## 2015-09-21 ENCOUNTER — Encounter (HOSPITAL_COMMUNITY): Payer: Self-pay | Admitting: Interventional Radiology

## 2015-09-21 ENCOUNTER — Inpatient Hospital Stay (HOSPITAL_COMMUNITY): Payer: Managed Care, Other (non HMO)

## 2015-09-21 DIAGNOSIS — E785 Hyperlipidemia, unspecified: Secondary | ICD-10-CM

## 2015-09-21 LAB — HEPARIN LEVEL (UNFRACTIONATED)
HEPARIN UNFRACTIONATED: 0.12 [IU]/mL — AB (ref 0.30–0.70)
Heparin Unfractionated: <0.1 IU/mL — ABNORMAL LOW (ref 0.30–0.70)
Heparin Unfractionated: 0.11 IU/mL — ABNORMAL LOW (ref 0.30–0.70)

## 2015-09-21 LAB — CBC WITH DIFFERENTIAL/PLATELET
BASOS PCT: 0 %
Basophils Absolute: 0 10*3/uL (ref 0.0–0.1)
EOS ABS: 0.4 10*3/uL (ref 0.0–0.7)
EOS PCT: 4 %
HCT: 36.5 % — ABNORMAL LOW (ref 39.0–52.0)
Hemoglobin: 12.1 g/dL — ABNORMAL LOW (ref 13.0–17.0)
LYMPHS ABS: 2 10*3/uL (ref 0.7–4.0)
Lymphocytes Relative: 17 %
MCH: 28.3 pg (ref 26.0–34.0)
MCHC: 33.2 g/dL (ref 30.0–36.0)
MCV: 85.5 fL (ref 78.0–100.0)
MONO ABS: 0.9 10*3/uL (ref 0.1–1.0)
MONOS PCT: 8 %
Neutro Abs: 8.2 10*3/uL — ABNORMAL HIGH (ref 1.7–7.7)
Neutrophils Relative %: 71 %
Platelets: 235 10*3/uL (ref 150–400)
RBC: 4.27 MIL/uL (ref 4.22–5.81)
RDW: 13.8 % (ref 11.5–15.5)
WBC: 11.5 10*3/uL — ABNORMAL HIGH (ref 4.0–10.5)

## 2015-09-21 LAB — BASIC METABOLIC PANEL
Anion gap: 10 (ref 5–15)
BUN: 8 mg/dL (ref 6–20)
CALCIUM: 7.9 mg/dL — AB (ref 8.9–10.3)
CO2: 23 mmol/L (ref 22–32)
CREATININE: 0.8 mg/dL (ref 0.61–1.24)
Chloride: 106 mmol/L (ref 101–111)
GFR calc non Af Amer: >60 mL/min (ref >60)
GLUCOSE: 93 mg/dL (ref 65–99)
Potassium: 3.7 mmol/L (ref 3.5–5.1)
Sodium: 139 mmol/L (ref 135–145)

## 2015-09-21 LAB — PLATELET INHIBITION P2Y12: Platelet Function  P2Y12: 150 [PRU] — ABNORMAL LOW (ref 194–418)

## 2015-09-21 MED ORDER — BUTALBITAL-APAP-CAFFEINE 50-325-40 MG PO TABS
1.0000 | ORAL_TABLET | Freq: Three times a day (TID) | ORAL | Status: DC | PRN
  Administered 2015-09-22: 1 via ORAL
  Filled 2015-09-21: qty 1

## 2015-09-21 MED ORDER — AMLODIPINE BESYLATE 10 MG PO TABS
10.0000 mg | ORAL_TABLET | Freq: Every day | ORAL | Status: DC
Start: 2015-09-21 — End: 2015-09-23
  Administered 2015-09-21 – 2015-09-23 (×3): 10 mg via ORAL
  Filled 2015-09-21 (×3): qty 1

## 2015-09-21 MED ORDER — CHLORTHALIDONE 25 MG PO TABS
25.0000 mg | ORAL_TABLET | Freq: Every day | ORAL | Status: DC
  Administered 2015-09-22 – 2015-09-23 (×2): 25 mg via ORAL
  Filled 2015-09-21 (×3): qty 1

## 2015-09-21 MED ORDER — CLOPIDOGREL BISULFATE 75 MG PO TABS: 75.0000 mg | ORAL_TABLET | Freq: Every day | ORAL | Status: DC

## 2015-09-21 MED ORDER — ATORVASTATIN CALCIUM 10 MG PO TABS
10.0000 mg | ORAL_TABLET | Freq: Every day | ORAL | Status: DC
  Administered 2015-09-21 – 2015-09-22 (×2): 10 mg via ORAL
  Filled 2015-09-21 (×2): qty 1

## 2015-09-21 MED ORDER — BACLOFEN 10 MG PO TABS
10.0000 mg | ORAL_TABLET | Freq: Every day | ORAL | Status: DC
  Administered 2015-09-21 – 2015-09-22 (×2): 10 mg via ORAL
  Filled 2015-09-21 (×2): qty 1

## 2015-09-21 MED ORDER — ACETAMINOPHEN 325 MG PO TABS
650.0000 mg | ORAL_TABLET | Freq: Four times a day (QID) | ORAL | Status: DC | PRN
Start: 2015-09-21 — End: 2015-09-23
  Administered 2015-09-22: 650 mg via ORAL
  Filled 2015-09-21: qty 2

## 2015-09-21 MED ORDER — ALPRAZOLAM 0.5 MG PO TABS
1.0000 mg | ORAL_TABLET | Freq: Once | ORAL | Status: AC
  Administered 2015-09-21: 1 mg via ORAL
  Filled 2015-09-21: qty 2

## 2015-09-21 MED ORDER — HEPARIN (PORCINE) IN NACL 100-0.45 UNIT/ML-% IJ SOLN
1000.0000 [IU]/h | INTRAMUSCULAR | Status: AC
  Administered 2015-09-21: 1000 [IU]/h via INTRAVENOUS
  Filled 2015-09-21 (×2): qty 250

## 2015-09-21 MED ORDER — LISINOPRIL 20 MG PO TABS
40.0000 mg | ORAL_TABLET | Freq: Every morning | ORAL | Status: DC
  Administered 2015-09-21 – 2015-09-23 (×3): 40 mg via ORAL
  Filled 2015-09-21 (×3): qty 2

## 2015-09-21 MED ORDER — ASPIRIN EC 325 MG PO TBEC
325.0000 mg | DELAYED_RELEASE_TABLET | Freq: Every day | ORAL | Status: DC
Start: 2015-09-21 — End: 2015-09-23
  Administered 2015-09-22 – 2015-09-23 (×2): 325 mg via ORAL
  Filled 2015-09-21 (×3): qty 1

## 2015-09-21 MED ORDER — ESCITALOPRAM OXALATE 10 MG PO TABS
10.0000 mg | ORAL_TABLET | Freq: Every day | ORAL | Status: DC
  Administered 2015-09-21 – 2015-09-23 (×3): 10 mg via ORAL
  Filled 2015-09-21 (×3): qty 1

## 2015-09-21 MED ORDER — FLUTICASONE PROPIONATE 50 MCG/ACT NA SUSP
2.0000 | Freq: Every day | NASAL | Status: DC | PRN
  Filled 2015-09-21: qty 16

## 2015-09-21 MED ORDER — METOPROLOL TARTRATE 50 MG PO TABS
100.0000 mg | ORAL_TABLET | Freq: Two times a day (BID) | ORAL | Status: DC
  Administered 2015-09-21 – 2015-09-23 (×3): 100 mg via ORAL
  Filled 2015-09-21 (×4): qty 2

## 2015-09-21 MED ORDER — DIVALPROEX SODIUM 500 MG PO DR TAB
500.0000 mg | DELAYED_RELEASE_TABLET | Freq: Two times a day (BID) | ORAL | Status: DC
  Administered 2015-09-21 – 2015-09-23 (×5): 500 mg via ORAL
  Filled 2015-09-21 (×5): qty 1

## 2015-09-21 NOTE — Progress Notes (Signed)
ANTICOAGULATION CONSULT NOTE - Follow-up Consult  Pharmacy Consult for heparin  Indication: post intervention  No Known Allergies  Patient Measurements: Height: 5\' 8"  (172.7 cm) Weight: 246 lb 4 oz (111.698 kg) IBW/kg (Calculated) : 68.4 Heparin Dosing Weight: 93kg  Vital Signs: Temp: 97.9 F (36.6 C) (04/25 0000) Temp Source: Oral (04/25 0000) BP: 130/64 mmHg (04/24 2230) Pulse Rate: 69 (04/24 2230)  Labs:  Recent Labs  09/20/15 0905 09/20/15 2323  HGB 13.7  --   HCT 41.5  --   PLT 306  --   APTT 26  --   LABPROT 13.3  --   INR 0.99  --   HEPARINUNFRC  --  <0.10*  CREATININE 0.94  --     Estimated Creatinine Clearance: 100 mL/min (by C-G formula based on Cr of 0.94).   Assessment: 60 yo male s/p CCA angiogram and endovascular aneurysm treatment. Heparin level undetectable on 750 units/hr. No issues with line or bleeding reported per RN.  Goal of Therapy:  aPTT 50-60 seconds  Heparin level 0.1-0.25 Monitor platelets by anticoagulation protocol: Yes   Plan:  -Increase heparin to 1000 units/hr  -Heparin to be off at [place], PharmD, [place] pharmacist, pager [phone]  09/21/2015 1:00 AM

## 2015-09-21 NOTE — Progress Notes (Signed)
ANTICOAGULATION CONSULT NOTE - Follow-up Consult  Pharmacy Consult for heparin  Indication: post intervention  No Known Allergies  Patient Measurements: Height: 5\' 8"  (172.7 cm) Weight: 246 lb 4 oz (111.698 kg) IBW/kg (Calculated) : 68.4 Heparin Dosing Weight: 93kg  Vital Signs: Temp: 98.4 F (36.9 C) (04/25 2000) Temp Source: Oral (04/25 2000) BP: 97/70 mmHg (04/25 1800) Pulse Rate: 67 (04/25 1800)  Labs:  Recent Labs  09/20/15 0905 09/20/15 2323 09/21/15 0516 09/21/15 1509 09/21/15 2050  HGB 13.7  --  12.1*  --   --   HCT 41.5  --  36.5*  --   --   PLT 306  --  235  --   --   APTT 26  --   --   --   --   LABPROT 13.3  --   --   --   --   INR 0.99  --   --   --   --   HEPARINUNFRC  --  <0.10*  --  0.12* 0.11*  CREATININE 0.94  --  0.80  --   --     Estimated Creatinine Clearance: 117.5 mL/min (by C-G formula based on Cr of 0.8).   Assessment: 60 yo male s/p CCA angiogram and endovascular aneurysm treatment. Heparin resumed this AM at previous rate until results of MRI are available per RN.  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 Estanislado Pandy: continue low dose heparin until 6am on 4/26.  Last heparin level was 0.12 at 3pm  Goal of Therapy:  aPTT 50-60 seconds  Heparin level 0.1-0.25 Monitor platelets by anticoagulation protocol: Yes   Plan:  -Continue heparin at 1000 units/hr -Will confirm a heparin level tonight  Hildred Laser, Pharm D 09/21/2015 9:30 PM  Addendum -heparin level confirmed at 0.11  Plan -No heparin changes needed -Heparin off at 6am per Dr.  Awanda Mink, Pharm D 09/21/2015 9:31 PM

## 2015-09-21 NOTE — Progress Notes (Signed)
Patient ID: [name], male   DOB: 1955-11-04, 60 y.o.   MRN: [id number] INR.  MRI brain reviewed . Reveals  3 punctate mid frontal and parietal diffusion restriction areas. CTA  Circle of willis widely patent. Clinically continues to have intermittent mild lt facial droop,and paresthesias of lt thumb. Motor  Exam normal . Plan Will leave patient on low dose IV heparin overnight,and encourage po intake. Will request stroke neurology consult. Duel antiplatelets to continue. D/W patient and daughter. Arlean Hopping MD

## 2015-09-21 NOTE — Progress Notes (Signed)
Assessed patient at 0430 and his Art line was painful and half way removed. Was not able to salvage it. Removed it and placed pressure dressing at site. Will continue to monitor.

## 2015-09-21 NOTE — Progress Notes (Signed)
ANTICOAGULATION CONSULT NOTE - Follow-up Consult  Pharmacy Consult for heparin  Indication: post intervention  No Known Allergies  Patient Measurements: Height: 5\' 8"  (172.7 cm) Weight: 246 lb 4 oz (111.698 kg) IBW/kg (Calculated) : 68.4 Heparin Dosing Weight: 93kg  Vital Signs: Temp: 98.6 F (37 C) (04/25 1600) Temp Source: Oral (04/25 1600) BP: 105/64 mmHg (04/25 1700) Pulse Rate: 65 (04/25 1700)  Labs:  Recent Labs  09/20/15 0905 09/20/15 2323 09/21/15 0516 09/21/15 1509  HGB 13.7  --  12.1*  --   HCT 41.5  --  36.5*  --   PLT 306  --  235  --   APTT 26  --   --   --   LABPROT 13.3  --   --   --   INR 0.99  --   --   --   HEPARINUNFRC  --  <0.10*  --  0.12*  CREATININE 0.94  --  0.80  --     Estimated Creatinine Clearance: 117.5 mL/min (by C-G formula based on Cr of 0.8).   Assessment: 60 yo male s/p CCA angiogram and endovascular aneurysm treatment. Heparin resumed this AM at previous rate until results of MRI are available per RN.  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 Estanislado Pandy: continue low dose heparin until 6am on 4/26.  Last heparin level was 0.12 at 3pm  Goal of Therapy:  aPTT 50-60 seconds  Heparin level 0.1-0.25 Monitor platelets by anticoagulation protocol: Yes   Plan:  -Continue heparin at 1000 units/hr -Will confirm a heparin level tonight  Hildred Laser, Pharm D 09/21/2015 5:54 PM

## 2015-09-21 NOTE — H&P (Signed)
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 male   Pt known to [place] Discharged just 09/17/15 to return 4/24 for intracranial aneurysm pipeline stent placement per Dr Bari Edward request Dr Luanne Bras saw pt in consultation 09/14/15 Cerebral arteriogram performed 4/20:      Expand All Collapse All   S/P 4 vessel cerebral arteriogram. RT CFA approach. 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      Procedure was performed 09/20/15:     Expand All Collapse All   S/P RT CCA angiogram followed by endovascular treatment of bilobed pericallosal aneurysm using a single 2.5 mm x 16 mm pipeline flow diverter device.     Pt has had some headaches and Left facial numbness with droop last evening CTA Head 4/24 pm showed No abnormality with successful pipeline placement for pericallosal aneurysm. Doing well now Dr Estanislado Pandy has seen and evaluated pt this am Plan for MRI Brain w/o cx per Dr Estanislado Pandy Has has ASA/Plavix this am  Past Medical History  Diagnosis Date  . Chest pain, atypical     12/2003:  negative cardiolyte  . HTN (hypertension)   . HLD (hyperlipidemia)   . Depression   . Hx of tear of ACL (anterior cruciate ligament)     right  . History of meniscal tear     bilateral  . Erectile dysfunction   . Insomnia   . Eczema   . Olecranon bursitis of left elbow 10/2009    s/p I&D by Dr Maxie Better, initially assessed by Dr. Nori Riis   . Allergy   . Anxiety   . Cataract   . Prediabetes   . TIA (transient ischemic attack)     Past Surgical History  Procedure Laterality Date  . Skin tag removal      11 removed  . Knee arthroscopy    . Incise and drain abcess      L elbow due to cellulitis/bursitis  . Inner ear surgery       Allergies: Review of patient's allergies indicates no known allergies.  Medications: Prior to Admission medications   Medication Sig Start Date End Date Taking? Authorizing Provider  amLODipine (NORVASC) 10 MG tablet Take 1 tablet (10 mg total) by mouth daily. 10/08/14  Yes Davonna Belling, MD  aspirin EC 325 MG EC tablet Take 1 tablet (325 mg total) by mouth daily. 09/17/15  Yes Shanker Kristeen Mans, MD  atorvastatin (LIPITOR) 10 MG tablet Take 10 mg by mouth daily at 6 PM.  12/01/14 12/01/15 Yes Historical Provider, MD  baclofen (LIORESAL) 10 MG tablet Take 1 tablet (10 mg total) by mouth at bedtime. 09/17/15  Yes Shanker Kristeen Mans, MD  chlorthalidone (HYGROTON) 25 MG tablet Take 25 mg by mouth daily. 07/29/15 10/27/15 Yes Historical Provider, MD  clopidogrel (PLAVIX) 75 MG tablet Take 1 tablet (75 mg total) by mouth daily. 09/17/15  Yes Shanker Kristeen Mans, MD  divalproex (DEPAKOTE) 500 MG DR tablet Take 1 tablet (500 mg total) by mouth every 12 (twelve) hours. 09/17/15  Yes Shanker Kristeen Mans, MD  escitalopram (LEXAPRO) 10 MG tablet Take 10 mg by mouth daily.  11/29/14  Yes Historical Provider, MD  lisinopril (PRINIVIL,ZESTRIL) 20 MG tablet Take 2 tablets (40 mg total) by mouth every morning.  10/08/14  Yes Davonna Belling, MD  metoprolol (LOPRESSOR) 50 MG tablet Take 2 tablets (100 mg total) by mouth 2 (two) times daily. 09/17/15  Yes Shanker Kristeen Mans, MD  fluticasone (FLONASE) 50 MCG/ACT nasal spray Place 2 sprays into the nose daily. Patient taking differently: Place 2 sprays into the nose daily as needed for allergies.  09/07/12   Cleatrice Burke, PA-C     Family History  Problem Relation Age of Onset  . Colon cancer Neg Hx   . Rectal cancer Neg Hx   . Stomach cancer Neg Hx   . Stroke Mother   . Hypertension Mother   . Aneurysm Mother 79    Died of brain aneursym  . Heart failure Father   . Emphysema Father   . Diabetes Mellitus II Sister     Social History   Social History  . Marital  Status: Divorced    Spouse Name: N/A  . Number of Children: N/A  . Years of Education: N/A   Social History Main Topics  . Smoking status: Never Smoker   . Smokeless tobacco: Never Used  . Alcohol Use: No  . Drug Use: No  . Sexual Activity: Not Asked   Other Topics Concern  . None   Social History Narrative   Separated, has 2 daughters, works as a Forensic psychologist, Therapist, occupational     Review of Systems: A 12 point ROS discussed and pertinent positives are indicated in the HPI above.  All other systems are negative.  Review of Systems  Constitutional: Positive for activity change and fatigue. Negative for fever and appetite change.  HENT: Negative for hearing loss, nosebleeds, tinnitus, trouble swallowing and voice change.   Eyes: Negative for visual disturbance.  Respiratory: Negative for cough and shortness of breath.   Cardiovascular: Negative for chest pain.  Gastrointestinal: Negative for abdominal pain.  Musculoskeletal: Negative for back pain.  Neurological: Positive for weakness, light-headedness, numbness and headaches. Negative for dizziness, tremors, seizures, syncope, facial asymmetry and speech difficulty.  Psychiatric/Behavioral: Negative for behavioral problems and confusion.    Vital Signs: BP 136/55 mmHg  Pulse 73  Temp(Src) 98.4 F (36.9 C) (Oral)  Resp 18  Ht 5\' 8"  (1.727 m)  Wt 246 lb 4 oz (111.698 kg)  BMI 37.45 kg/m2  SpO2 96%  Physical Exam  Constitutional: He is oriented to person, place, and time. He appears well-nourished.  HENT:  A/O EOM Face symmetrical Tongue midline   Cardiovascular: Normal rate, regular rhythm and normal heart sounds.   No murmur heard. Pulmonary/Chest: Effort normal and breath sounds normal. He has no wheezes.  Abdominal: Soft. Bowel sounds are normal. There is no tenderness.  Musculoskeletal: Normal range of motion.  Neurological: He is oriented to person, place, and time.  Skin: Skin is dry.  Rt groin  clean and dry No bleeding + ecchymotic No hematoma Rt foot 2+ pulses  Psychiatric: He has a normal mood and affect. His behavior is normal. Judgment and thought content normal.  Nursing note and vitals reviewed.   Mallampati Score:     Imaging: Ct Angio Head W/cm &/or Wo Cm  09/20/2015  CLINICAL DATA:  Initial evaluation for acute left-sided weakness status post pipeline device placement for pericallosal aneurysm. Symptoms now all resolved. EXAM: CT ANGIOGRAPHY HEAD AND NECK TECHNIQUE: Multidetector CT imaging of the head and neck was performed using the standard protocol during bolus administration of intravenous contrast. Multiplanar CT image reconstructions and MIPs were obtained to evaluate the  vascular anatomy. Carotid stenosis measurements (when applicable) are obtained utilizing NASCET criteria, using the distal internal carotid diameter as the denominator. CONTRAST:  50 cc of Isovue 370. COMPARISON:  Prior studies from earlier same day. FINDINGS: CTA NECK Aortic arch: Visualized aortic arch of normal caliber with normal branch pattern. No high-grade stenosis at the origin of the great vessels. Visualized subclavian arteries widely patent. Right carotid system: Right common carotid artery patent from its origin to the bifurcation. Mild noncalcified plaque about the right bifurcation without stenosis. Right ICA widely patent from the bifurcation to the skullbase. No stenosis, dissection, or vascular occlusion within the right carotid artery system. Left carotid system: Left common carotid artery patent from its origin to the bifurcation. Minimal calcified noncalcified plaque about the left bifurcation without stenosis. Left ICA patent from the bifurcation to the skullbase. No stenosis, dissection, or vascular occlusion within the left carotid artery system. Vertebral arteries:Both vertebral arteries arise from the subclavian arteries. Left vertebral artery is dominant. Vertebral arteries patent  without stenosis, dissection, or occlusion. 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 No worrisome lytic or blastic osseous lesions. Other neck: Visualized lungs are clear. Visualized superior mediastinum within normal limits. Few scattered subcentimeter hypodense nodules noted within the thyroid gland, of doubtful clinical significance. No adenopathy within the neck. No acute soft tissue abnormality. CTA HEAD Anterior circulation: Petrous segments widely patent bilaterally. Minimal atheromatous plaque within the cavernous ICAs without stenosis. Supraclinoid segments widely patent. A1 segments well opacified. Dominant right ACA which is widely patent. 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 Persistent filling of the aneurysm which measures 8 x 7 x 9 mm. Flow seen distally the pipeline device within the distal pericallosal branches. M1 segments patent without stenosis or occlusion. MCA bifurcations normal. No proximal M2 branch occlusion or stenosis. Distal MCA branches well opacified. Posterior circulation: Vertebral arteries patent to the vertebrobasilar junction. Left vertebral artery dominant. Posterior inferior cerebral arteries patent. Basilar artery widely patent. Superior cerebellar arteries and posterior cerebral arteries well opacified. Prominent right posterior communicating artery with hypoplastic right P1 segment. Venous sinuses: No evidence for venous sinus thrombosis. Anatomic variants: No significant anatomic variant. Delayed phase: Not performed. IMPRESSION: 1. No large or proximal arterial branch occlusion. No complication status post recent catheter directed arteriogram. 2. 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 3. Mild intracranial and extracranial atheromatous disease as above. Electronically Signed   By: Jeannine Boga M.D.   On: 09/20/2015 22:49   Dg Chest 2 View  09/15/2015  CLINICAL DATA:  Headache for 2 days.  Possible TIA. EXAM: CHEST  2 VIEW COMPARISON:  11/19/2009 FINDINGS: The heart size and mediastinal contours are within normal limits. Both lungs are clear. The visualized skeletal structures are unremarkable. IMPRESSION: No active cardiopulmonary disease. Electronically Signed   By: Lucienne Capers M.D.   On: 09/15/2015 02:41   Ct Head Wo Contrast  09/20/2015  ADDENDUM REPORT: 09/20/2015 21:54 ADDENDUM: These results were called by telephone at the time of interpretation on 09/20/2015 at 9:49 pm to Dr. Wallie Char, who verbally acknowledged these results. Electronically Signed   By: Logan Bores M.D.   On: 09/20/2015 21:54  09/20/2015  CLINICAL DATA:  Code stroke. Left-sided facial droop. Status post endovascular treatment of pericallosal aneurysm earlier today. EXAM: CT HEAD WITHOUT CONTRAST TECHNIQUE: Contiguous axial images were obtained from the base of the skull through the  vertex without intravenous contrast. COMPARISON:  Head MRI 09/15/2015 and CT 09/14/2015 FINDINGS: There has been interval placement of a pipeline stent for treatment of the previously described pericallosal aneurysm. No acute cortical infarct, intracranial hemorrhage, mass, midline shift, or extra-axial fluid collection is identified. Scattered, small foci of hypoattenuation in the cerebral white matter bilaterally are similar to the prior CT and nonspecific but compatible with mild chronic small vessel ischemic disease. Ventricles and sulci are normal in size. Orbits are unremarkable. Mild paranasal sinus mucosal thickening and prior left mastoidectomy are noted. IMPRESSION: 1. No evidence of acute intracranial abnormality following recent endovascular pericallosal aneurysm treatment. 2. Mild chronic small vessel ischemic disease. Electronically Signed: By: Logan Bores M.D. On: 09/20/2015 21:47   Ct Head Wo  Contrast  09/14/2015  CLINICAL DATA:  Transient ischemic attack in March. Headache starting 7 p.m. tonight with perioral numbness. Shortness of breath. Anxiety. EXAM: CT HEAD WITHOUT CONTRAST TECHNIQUE: Contiguous axial images were obtained from the base of the skull through the vertex without intravenous contrast. COMPARISON:  07/28/2015 FINDINGS: Along the medial left frontal cortex there is a suggestion of a 1.3 by 0.6 cm slightly hyperdense lesion on image 15/2. This could conceivably be a cortical lesion such as venous angioma, or anterior cerebral artery aneurysm. This structure was probably present on last month 's exam, but was volume averaged leading to poor conspicuity on that exam. For Otherwise, the brainstem, cerebellum, cerebral peduncles, thalami, basal ganglia, basilar cisterns, and ventricular system appear within normal limits. No intracranial hemorrhage or acute CVA identified. Mild chronic ischemic microvascular white matter disease. Polypoid mucoperiosteal thickening in the maxillary sinuses. Opacification of several ethmoid air cells, as before. Left mastoidectomy. IMPRESSION: 1. Slightly hyperdense 1.3 by 0.6 cm lesion along the anterior falx, possibly a left medial frontal lobe cortical lesion such as a venous angioma, but I cannot exclude any anterior cerebral artery aneurysm. Consider MRI brain with MR angiography, or CT angiography of the brain, for further characterization. 2. No stroke identified. 3. Paranasal sinusitis involving the maxillary and ethmoid sinuses. Prior left mastoidectomy. 4. Mild chronic ischemic microvascular white matter disease. Electronically Signed   By: Van Clines M.D.   On: 09/14/2015 21:09   Ct Angio Neck W/cm &/or Wo/cm  09/20/2015  CLINICAL DATA:  Initial evaluation for acute left-sided weakness status post pipeline device placement for pericallosal aneurysm. Symptoms now all resolved. EXAM: CT ANGIOGRAPHY HEAD AND NECK TECHNIQUE: Multidetector CT  imaging of the head and neck was performed using the standard protocol during bolus administration of intravenous contrast. Multiplanar CT image reconstructions and MIPs were obtained to evaluate the vascular anatomy. Carotid stenosis measurements (when applicable) are obtained utilizing NASCET criteria, using the distal internal carotid diameter as the denominator. CONTRAST:  50 cc of Isovue 370. COMPARISON:  Prior studies from earlier same day. FINDINGS: CTA NECK Aortic arch: Visualized aortic arch of normal caliber with normal branch pattern. No high-grade stenosis at the origin of the great vessels. Visualized subclavian arteries widely patent. Right carotid system: Right common carotid artery patent from its origin to the bifurcation. Mild noncalcified plaque about the right bifurcation without stenosis. Right ICA widely patent from the bifurcation to the skullbase. No stenosis, dissection, or vascular occlusion within the right carotid artery system. Left carotid system: Left common carotid artery patent from its origin to the bifurcation. Minimal calcified noncalcified plaque about the left bifurcation without stenosis. Left ICA patent from the bifurcation to the skullbase. No stenosis, dissection, or vascular occlusion  within the left carotid artery system. Vertebral arteries:Both vertebral arteries arise from the subclavian arteries. Left vertebral artery is dominant. Vertebral arteries patent without stenosis, dissection, or occlusion. 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 No worrisome lytic or blastic osseous lesions. Other neck: Visualized lungs are clear. Visualized superior mediastinum within normal limits. Few scattered subcentimeter hypodense nodules noted within the thyroid gland, of doubtful clinical significance. No adenopathy within the neck. No acute soft tissue abnormality. CTA HEAD Anterior circulation:  Petrous segments widely patent bilaterally. Minimal atheromatous plaque within the cavernous ICAs without stenosis. Supraclinoid segments widely patent. A1 segments well opacified. Dominant right ACA which is widely patent. 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 Persistent filling of the aneurysm which measures 8 x 7 x 9 mm. Flow seen distally the pipeline device within the distal pericallosal branches. M1 segments patent without stenosis or occlusion. MCA bifurcations normal. No proximal M2 branch occlusion or stenosis. Distal MCA branches well opacified. Posterior circulation: Vertebral arteries patent to the vertebrobasilar junction. Left vertebral artery dominant. Posterior inferior cerebral arteries patent. Basilar artery widely patent. Superior cerebellar arteries and posterior cerebral arteries well opacified. Prominent right posterior communicating artery with hypoplastic right P1 segment. Venous sinuses: No evidence for venous sinus thrombosis. Anatomic variants: No significant anatomic variant. Delayed phase: Not performed. IMPRESSION: 1. No large or proximal arterial branch occlusion. No complication status post recent catheter directed arteriogram. 2. 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 3. Mild intracranial and extracranial atheromatous disease as above. Electronically Signed   By: Jeannine Boga M.D.   On: 09/20/2015 22:49   Mr Brain Wo Contrast  09/15/2015  CLINICAL DATA:  Frontal headache beginning at 7 p.m. Mouth numbness. TIA 1 month ago. History of hypertension, hyperlipidemia. EXAM: MRI HEAD WITHOUT CONTRAST MRA HEAD WITHOUT CONTRAST TECHNIQUE: Multiplanar, multiecho pulse sequences of the brain and surrounding structures were obtained without intravenous contrast. Angiographic images of the head were obtained using MRA technique without contrast. COMPARISON:  CT head September 13, 2015  FINDINGS: MRI HEAD FINDINGS INTRACRANIAL CONTENTS: No reduced diffusion to suggest acute ischemia. Subcentimeter focus of susceptibility artifact LEFT caudate head without additional signal abnormality, possible cavernoma. Ventricles and sulci are normal for patient's age. Patchy pontine and scattered subcentimeter supratentorial white matter T2 hyperintensities, without midline shift or mass effect. Prominent supra and infratentorial perivascular spaces. No abnormal extra-axial fluid collections. Focal low T2, intermediate T1 focal signal along the anterior interhemispheric fissure corresponding to CT abnormality. ORBITS: The included ocular globes and orbital contents are non-suspicious. SINUSES: Mild paranasal sinus mucosal thickening. Bilateral maxillary sinus mucosal retention cyst. Mastoid air cells are well aerated. SKULL/SOFT TISSUES: No abnormal sellar expansion. No suspicious calvarial bone marrow signal. Craniocervical junction maintained. MRA HEAD FINDINGS Moderately motion degraded examination. Anterior circulation: Normal flow related enhancement of the included cervical, petrous, cavernous and supraclinoid internal carotid arteries. Patent anterior communicating artery. 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 Probable retrograde flow into RIGHT A3 segment. Poor visualized RIGHT A2 segment. No large vessel occlusion, high-grade stenosis, abnormal luminal irregularity. Posterior circulation: LEFT vertebral artery is dominant. Basilar artery is patent, with normal flow related enhancement of the main branch vessels. Robust RIGHT posterior communicating artery. Normal flow related enhancement of the posterior cerebral arteries. No large vessel occlusion, high-grade stenosis, abnormal luminal irregularity, aneurysm. IMPRESSION: MRI HEAD: No acute intracranial process. Susceptibility  artifact LEFT caudate head could represent a cavernoma. Moderate chronic small  vessel ischemic disease. MRA HEAD: Moderately motion degraded examination. 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 Poorly visualized RIGHT A2 segment. Recommend CTA head for further characterization. No emergent large vessel occlusion or high-grade stenosis though motion limits assessment. Electronically Signed   By: Elon Alas M.D.   On: 09/15/2015 02:56   Mr Jodene Nam Head/brain Wo Cm  09/15/2015  CLINICAL DATA:  Frontal headache beginning at 7 p.m. Mouth numbness. TIA 1 month ago. History of hypertension, hyperlipidemia. EXAM: MRI HEAD WITHOUT CONTRAST MRA HEAD WITHOUT CONTRAST TECHNIQUE: Multiplanar, multiecho pulse sequences of the brain and surrounding structures were obtained without intravenous contrast. Angiographic images of the head were obtained using MRA technique without contrast. COMPARISON:  CT head September 13, 2015 FINDINGS: MRI HEAD FINDINGS INTRACRANIAL CONTENTS: No reduced diffusion to suggest acute ischemia. Subcentimeter focus of susceptibility artifact LEFT caudate head without additional signal abnormality, possible cavernoma. Ventricles and sulci are normal for patient's age. Patchy pontine and scattered subcentimeter supratentorial white matter T2 hyperintensities, without midline shift or mass effect. Prominent supra and infratentorial perivascular spaces. No abnormal extra-axial fluid collections. Focal low T2, intermediate T1 focal signal along the anterior interhemispheric fissure corresponding to CT abnormality. ORBITS: The included ocular globes and orbital contents are non-suspicious. SINUSES: Mild paranasal sinus mucosal thickening. Bilateral maxillary sinus mucosal retention cyst. Mastoid air cells are well aerated. SKULL/SOFT TISSUES: No abnormal sellar expansion. No suspicious calvarial bone marrow signal. Craniocervical junction maintained. MRA HEAD FINDINGS Moderately motion degraded examination. Anterior circulation: Normal flow related  enhancement of the included cervical, petrous, cavernous and supraclinoid internal carotid arteries. Patent anterior communicating artery. 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 Probable retrograde flow into RIGHT A3 segment. Poor visualized RIGHT A2 segment. No large vessel occlusion, high-grade stenosis, abnormal luminal irregularity. Posterior circulation: LEFT vertebral artery is dominant. Basilar artery is patent, with normal flow related enhancement of the main branch vessels. Robust RIGHT posterior communicating artery. Normal flow related enhancement of the posterior cerebral arteries. No large vessel occlusion, high-grade stenosis, abnormal luminal irregularity, aneurysm. IMPRESSION: MRI HEAD: No acute intracranial process. Susceptibility artifact LEFT caudate head could represent a cavernoma. Moderate chronic small vessel ischemic disease. MRA HEAD: Moderately motion degraded examination. 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 Poorly visualized RIGHT A2 segment. Recommend CTA head for further characterization. No emergent large vessel occlusion or high-grade stenosis though motion limits assessment. Electronically Signed   By: Elon Alas M.D.   On: 09/15/2015 02:56    Labs:  CBC:  Recent Labs  07/28/15 0350  09/14/15 2049 09/14/15 2056 09/20/15 0905 09/21/15 0516  WBC 10.4  --  12.1*  --  11.8* 11.5*  HGB 15.0  < > 15.1 16.3 13.7 12.1*  HCT 44.7  < > 43.4 48.0 41.5 36.5*  PLT 364  --  377  --  306 235  < > = values in this interval not displayed.  COAGS:  Recent Labs  09/14/15 2049 09/20/15 0905  INR 1.10 0.99  APTT 25 26    BMP:  Recent Labs  09/14/15 2049 09/14/15 2056 09/20/15 0905 09/21/15 0516  NA 136 139 140 139  K 3.5 3.5 4.0 3.7  CL 100* 101 104 106  CO2 24  --  24 23  GLUCOSE 222* 215* 104* 93  BUN 26* 25* 16 8  CALCIUM 8.9  --  9.1 7.9*  CREATININE 1.17 1.10 0.94 0.80   GFRNONAA >60  --  >60 >60  GFRAA >60  --  >60 >60    LIVER FUNCTION TESTS:  Recent Labs  09/14/15 2049  BILITOT 0.7  AST 21  ALT 26  ALKPHOS 74  PROT 7.6  ALBUMIN 3.9    TUMOR MARKERS: No results for input(s): AFPTM, CEA, CA199, CHROMGRNA in the last 8760 hours.  Assessment and Plan:  Pericallosal aneurysm pipeline diverter stent placement 4/24 in IR with Dr Estanislado Pandy For MRI Brain today Dc foley Advance diet Plan per Dr Estanislado Pandy  Thank you for this interesting consult.  I greatly enjoyed meeting [name] and look forward to participating in their care.  A copy of this report was sent to the requesting provider on this date.  Electronically Signed: Monia Sabal A 09/21/2015, 9:54 AM   I spent a total of 20 Minutes    in face to face in clinical consultation, greater than 50% of which was counseling/coordinating care for pericallosal aneurysm stent

## 2015-09-21 NOTE — Progress Notes (Signed)
ANTICOAGULATION CONSULT NOTE - Follow-up Consult  Pharmacy Consult for heparin  Indication: post intervention  No Known Allergies  Patient Measurements: Height: 5\' 8"  (172.7 cm) Weight: 246 lb 4 oz (111.698 kg) IBW/kg (Calculated) : 68.4 Heparin Dosing Weight: 93kg  Vital Signs: Temp: 97.8 F (36.6 C) (04/25 0400) Temp Source: Oral (04/25 0400) BP: 128/62 mmHg (04/25 0800) Pulse Rate: 73 (04/25 0800)  Labs:  Recent Labs  09/20/15 0905 09/20/15 2323 09/21/15 0516  HGB 13.7  --  12.1*  HCT 41.5  --  36.5*  PLT 306  --  235  APTT 26  --   --   LABPROT 13.3  --   --   INR 0.99  --   --   HEPARINUNFRC  --  <0.10*  --   CREATININE 0.94  --  0.80    Estimated Creatinine Clearance: 117.5 mL/min (by C-G formula based on Cr of 0.8).   Assessment: 60 yo male s/p CCA angiogram and endovascular aneurysm treatment. Heparin resumed this AM at previous rate until results of MRI are available per RN. Drip turned off ~10-15 minutes only. Hg 12.1, plt wnl. No bleed, no AC pta.  Goal of Therapy:  aPTT 50-60 seconds  Heparin level 0.1-0.25 Monitor platelets by anticoagulation protocol: Yes   Plan:  -Heparin resumed at 1000 units/h -6h HL, daily HL/CBC -Monitor s/sx bleeding  Elicia Lamp, PharmD, [facility] Clinical Pharmacist Pager [phone] 09/21/2015 8:42 AM

## 2015-09-21 NOTE — Consult Note (Signed)
STROKE NEUROLOGY CONSULT NOTE  Referring Physician: Dr. Estanislado Pandy    Chief Complaint: intermittent left facial numbness/weakness and slurry speech and left hand weakness and numbness  HPI: [name] is an 60 y.o. male with PMH of HTN, HLD, migraine with aura, anxiety and depression had ED evaluation on 07/28/15 for right temporal HA with visual aura, lip and b/l hand numbness, lasted for 30 min. Head CT at that time reported normal and he was discharged. He was again admitted on 09/14/15 due to frontal HA and mouth numbness but no visual change. CT showed left medial frontal hyperdensity. 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 Put on depakote '500mg'$  bid for HA prevention. Dr. 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 Had pipeline stent yesterday and finished at 5pm.   As per pt, after procedure, pt started to have right temporal HA, and left facial and mouth numbness as well as left hand numbness. No vision changes, no N/V, and no weakness. Around 10pm last night, he had acute left facial droop and slurry speech, and left hand weakness. Lasting 30mn and resolved. CT stat no acute changes and CTA negative for acute cerebral vessel occlusion. Still has HA and left face, mouth and hand numbness. Around 5am this morning, pt again had similar weakness resolved in about one hour. This happened again around 7am. MRI was done this pm showed 3 punctate acute infarcts at right frontal cortex, high right parietal cortex and posterior right parietal cortex. Neurology was consulted for further evaluation. Currently pt HA getting better as well as the left face and hand numbness but the left facial and hand weakness continued. Also complains of decreased visual acuity.   Of note, pt had long history of migraine HA with visual aura (tunnel vision and squiggly lines) with bilateral face, lip and hands numbness. Takes ibuprofen '1000mg'$  and helps to abort the HA. Hx of  HLD on lipitor and HTN on lisinopril, hygroton and metoprolol. Hx of depression and on lexapro   Past Medical History  Diagnosis Date  . Chest pain, atypical     12/2003:  negative cardiolyte  . HTN (hypertension)   . HLD (hyperlipidemia)   . Depression   . Hx of tear of ACL (anterior cruciate ligament)     right  . History of meniscal tear     bilateral  . Erectile dysfunction   . Insomnia   . Eczema   . Olecranon bursitis of left elbow 10/2009    s/p I&D by Dr BMaxie Better initially assessed by Dr. NNori Riis  . Allergy   . Anxiety   . Cataract   . Prediabetes   . TIA (transient ischemic attack)     Past Surgical History  Procedure Laterality Date  . Skin tag removal      11 removed  . Knee arthroscopy    . Incise and drain abcess      L elbow due to cellulitis/bursitis  . Inner ear surgery    . Radiology with anesthesia N/A 09/20/2015    Procedure: EMBOLIZATION         (RADIOLOGY WITH ANESTHESIA);  Surgeon: SLuanne Bras MD;  [place]  Service: Radiology;  Laterality: N/A;    Family History  Problem Relation Age of Onset  . Colon cancer Neg Hx   . Rectal cancer Neg Hx   . Stomach cancer Neg Hx   . Stroke Mother   . Hypertension Mother   .  Aneurysm Mother 34    Died of brain aneursym  . Heart failure Father   . Emphysema Father   . Diabetes Mellitus II Sister    Social History:  reports that he has never smoked. He has never used smokeless tobacco. He reports that he does not drink alcohol or use illicit drugs.  Allergies: No Known Allergies  Medications:  Prior to Admission:  Prescriptions prior to admission  Medication Sig Dispense Refill Last Dose  . amLODipine (NORVASC) 10 MG tablet Take 1 tablet (10 mg total) by mouth daily. 30 tablet 0 09/19/2015 at Unknown time  . aspirin EC 325 MG EC tablet Take 1 tablet (325 mg total) by mouth daily. 30 tablet 0 09/19/2015 at Unknown time  . atorvastatin (LIPITOR) 10 MG tablet Take 10 mg by mouth daily at 6 PM.     09/19/2015 at Unknown time  . baclofen (LIORESAL) 10 MG tablet Take 1 tablet (10 mg total) by mouth at bedtime. 30 each 0 09/19/2015 at Unknown time  . chlorthalidone (HYGROTON) 25 MG tablet Take 25 mg by mouth daily.   09/19/2015 at Unknown time  . clopidogrel (PLAVIX) 75 MG tablet Take 1 tablet (75 mg total) by mouth daily. 30 tablet 0 09/19/2015 at Unknown time  . divalproex (DEPAKOTE) 500 MG DR tablet Take 1 tablet (500 mg total) by mouth every 12 (twelve) hours. 60 tablet 0 09/19/2015 at Unknown time  . escitalopram (LEXAPRO) 10 MG tablet Take 10 mg by mouth daily.    09/19/2015 at Unknown time  . lisinopril (PRINIVIL,ZESTRIL) 20 MG tablet Take 2 tablets (40 mg total) by mouth every morning. 60 tablet 0 09/19/2015 at Unknown time  . metoprolol (LOPRESSOR) 50 MG tablet Take 2 tablets (100 mg total) by mouth 2 (two) times daily. 120 tablet 0 09/19/2015 at Unknown time  . fluticasone (FLONASE) 50 MCG/ACT nasal spray Place 2 sprays into the nose daily. (Patient taking differently: Place 2 sprays into the nose daily as needed for allergies. ) 16 g 0 More than a month at Unknown time    ROS: Review of Systems: ROS was attempted today and was able to be performed.  Systems assessed include - Constitutional, Eyes, HENT, Respiratory, Cardiovascular, Gastrointestinal, Genitourinary, Integument/breast, Hematologic/lymphatic, Musculoskeletal, Neurological, Behavioral/Psych, Endocrine,  Allergic/Immunologic - the patient complains of only the following symptoms, and all other reviewed systems are negative.  Physical Examination: Temp:  [97.3 F (36.3 C)-98.6 F (37 C)] 98.6 F (37 C) (04/25 1600) Pulse Rate:  [61-83] 67 (04/25 1800) Resp:  [12-26] 26 (04/25 1800) BP: (91-137)/(44-109) 97/70 mmHg (04/25 1800) SpO2:  [91 %-100 %] 91 % (04/25 1800) Arterial Line BP: (115-156)/(52-68) 150/61 mmHg (04/25 0400)  General - Well nourished, well developed, in no apparent distress.  Ophthalmologic - Fundi not  visualized due to eye movement.  Cardiovascular - Regular rate and rhythm with no murmur.  Mental Status -  Level of arousal and orientation to time, place, and person were intact. Language including expression, naming, repetition, comprehension was assessed and found intact, mild dysarthria Fund of Knowledge was assessed and was intact.  Cranial Nerves II - XII - II - Visual field intact OU. III, IV, VI - Extraocular movements intact. V - Facial sensation intact bilaterally. VII - left facial droop. VIII - Hearing & vestibular intact bilaterally. X - Palate elevates symmetrically, mild dysarthria. XI - Chin turning & shoulder shrug intact bilaterally. XII - Tongue protrusion intact.  Motor Strength - The patient's strength was normal in  all extremities except LUE bicep 4/5, tricep 4/5, and hand grip 3/5 with dexterity difficulty and pronator drift was absent.  Bulk was normal and fasciculations were absent.   Motor Tone - Muscle tone was assessed at the neck and appendages and was normal.  Reflexes - The patient's reflexes were 1+ in all extremities and he had no pathological reflexes.  Sensory - Light touch, temperature/pinprick were assessed and were symmetrical.    Coordination - The patient had normal movements in the hand with no ataxia or dysmetria.  Tremor was absent.  Gait and Station - not tested due to safety concerns.   Results for orders placed or performed during the hospital encounter of 09/20/15 (from the past 48 hour(s))  Glucose, capillary     Status: None   Collection Time: 09/20/15  9:00 AM  Result Value Ref Range   Glucose-Capillary 98 65 - 99 mg/dL  APTT     Status: None   Collection Time: 09/20/15  9:05 AM  Result Value Ref Range   aPTT 26 24 - 37 seconds  Basic metabolic panel     Status: Abnormal   Collection Time: 09/20/15  9:05 AM  Result Value Ref Range   Sodium 140 135 - 145 mmol/L   Potassium 4.0 3.5 - 5.1 mmol/L   Chloride 104 101 - 111  mmol/L   CO2 24 22 - 32 mmol/L   Glucose, Bld 104 (H) 65 - 99 mg/dL   BUN 16 6 - 20 mg/dL   Creatinine, Ser 0.94 0.61 - 1.24 mg/dL   Calcium 9.1 8.9 - 10.3 mg/dL   GFR calc non Af Amer >60 >60 mL/min   GFR calc Af Amer >60 >60 mL/min    Comment: (NOTE) The eGFR has been calculated using the CKD EPI equation. This calculation has not been validated in all clinical situations. eGFR's persistently <60 mL/min signify possible Chronic Kidney Disease.    Anion gap 12 5 - 15  CBC WITH DIFFERENTIAL     Status: Abnormal   Collection Time: 09/20/15  9:05 AM  Result Value Ref Range   WBC 11.8 (H) 4.0 - 10.5 K/uL   RBC 4.74 4.22 - 5.81 MIL/uL   Hemoglobin 13.7 13.0 - 17.0 g/dL   HCT 41.5 39.0 - 52.0 %   MCV 87.6 78.0 - 100.0 fL   MCH 28.9 26.0 - 34.0 pg   MCHC 33.0 30.0 - 36.0 g/dL   RDW 13.7 11.5 - 15.5 %   Platelets 306 150 - 400 K/uL   Neutrophils Relative % 63 %   Neutro Abs 7.5 1.7 - 7.7 K/uL   Lymphocytes Relative 22 %   Lymphs Abs 2.6 0.7 - 4.0 K/uL   Monocytes Relative 8 %   Monocytes Absolute 0.9 0.1 - 1.0 K/uL   Eosinophils Relative 6 %   Eosinophils Absolute 0.7 0.0 - 0.7 K/uL   Basophils Relative 1 %   Basophils Absolute 0.1 0.0 - 0.1 K/uL  Platelet inhibition p2y12 (not at [facility])     Status: Abnormal   Collection Time: 09/20/15  9:05 AM  Result Value Ref Range   Platelet Function  P2Y12 158 (L) 194 - 418 PRU    Comment:        The literature has shown a direct correlation of PRU values over 230 with higher risks of thrombotic events.  Lower PRU values are associated with platelet inhibition.   Protime-INR     Status: None   Collection Time: 09/20/15  9:05 AM  Result Value Ref Range   Prothrombin Time 13.3 11.6 - 15.2 seconds   INR 0.99 0.00 - 1.49  Heparin level (unfractionated)     Status: Abnormal   Collection Time: 09/20/15 11:23 PM  Result Value Ref Range   Heparin Unfractionated <0.10 (L) 0.30 - 0.70 IU/mL    Comment:        IF HEPARIN RESULTS ARE  BELOW EXPECTED VALUES, AND PATIENT DOSAGE HAS BEEN CONFIRMED, SUGGEST FOLLOW UP TESTING OF ANTITHROMBIN III LEVELS.   Basic metabolic panel     Status: Abnormal   Collection Time: 09/21/15  5:16 AM  Result Value Ref Range   Sodium 139 135 - 145 mmol/L   Potassium 3.7 3.5 - 5.1 mmol/L   Chloride 106 101 - 111 mmol/L   CO2 23 22 - 32 mmol/L   Glucose, Bld 93 65 - 99 mg/dL   BUN 8 6 - 20 mg/dL   Creatinine, Ser 0.80 0.61 - 1.24 mg/dL   Calcium 7.9 (L) 8.9 - 10.3 mg/dL   GFR calc non Af Amer >60 >60 mL/min   GFR calc Af Amer >60 >60 mL/min    Comment: (NOTE) The eGFR has been calculated using the CKD EPI equation. This calculation has not been validated in all clinical situations. eGFR's persistently <60 mL/min signify possible Chronic Kidney Disease.    Anion gap 10 5 - 15  CBC WITH DIFFERENTIAL     Status: Abnormal   Collection Time: 09/21/15  5:16 AM  Result Value Ref Range   WBC 11.5 (H) 4.0 - 10.5 K/uL   RBC 4.27 4.22 - 5.81 MIL/uL   Hemoglobin 12.1 (L) 13.0 - 17.0 g/dL   HCT 36.5 (L) 39.0 - 52.0 %   MCV 85.5 78.0 - 100.0 fL   MCH 28.3 26.0 - 34.0 pg   MCHC 33.2 30.0 - 36.0 g/dL   RDW 13.8 11.5 - 15.5 %   Platelets 235 150 - 400 K/uL   Neutrophils Relative % 71 %   Neutro Abs 8.2 (H) 1.7 - 7.7 K/uL   Lymphocytes Relative 17 %   Lymphs Abs 2.0 0.7 - 4.0 K/uL   Monocytes Relative 8 %   Monocytes Absolute 0.9 0.1 - 1.0 K/uL   Eosinophils Relative 4 %   Eosinophils Absolute 0.4 0.0 - 0.7 K/uL   Basophils Relative 0 %   Basophils Absolute 0.0 0.0 - 0.1 K/uL  Heparin level (unfractionated)     Status: Abnormal   Collection Time: 09/21/15  3:09 PM  Result Value Ref Range   Heparin Unfractionated 0.12 (L) 0.30 - 0.70 IU/mL    Comment:        IF HEPARIN RESULTS ARE BELOW EXPECTED VALUES, AND PATIENT DOSAGE HAS BEEN CONFIRMED, SUGGEST FOLLOW UP TESTING OF ANTITHROMBIN III LEVELS.   Platelet inhibition p2y12 (Not at [facility])     Status: Abnormal   Collection Time:  09/21/15  3:09 PM  Result Value Ref Range   Platelet Function  P2Y12 150 (L) 194 - 418 PRU    Comment:        The literature has shown a direct correlation of PRU values over 230 with higher risks of thrombotic events.  Lower PRU values are associated with platelet inhibition.    I have personally reviewed the radiological images below and agree with the radiology interpretations.  Ct Angio Head and neck W/cm &/or Wo Cm  09/20/2015   IMPRESSION: 1. No large or proximal arterial branch occlusion. No  complication status post recent catheter directed arteriogram. 2. 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 3. Mild intracranial and extracranial atheromatous disease as above.  Ct Head Wo Contrast  09/20/2015   IMPRESSION: 1. No evidence of acute intracranial abnormality following recent endovascular pericallosal aneurysm treatment. 2. Mild chronic small vessel ischemic disease.   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 Pipeline stent left anterior cerebral artery. No evidence of acute intracranial hemorrhage.   EEG pending  TTE pending   Assessment: 60 y.o. male with hx of migraine with aura, HTN, HLD, anxiety and depression admitted for HA and right ACA aneurysm s/p pipeline stent. Post op developed HA with left facial and hand numbness as well as episodic left facial and left hand weakness. CTA no LVO but MRI showed 3 punctate DWI changes consistent with small infarcts. One of the infarct at right frontal seems closer to left hand representative are but still a little far from left face are. However, his left facial and hand weakness more likely due to the small infarct and like to represent so call "stuttering stroke" due to episodic pattern, but eventually settled down. His left facial and left forearm/hand numbness tingling still more likely due to his migraine  HA and with HA getting better, those symptoms all subsided. He is on depakote for prevention and on tylenol of abortive therapy. Will add fioricet for acute HA and continue depakote. Will also do EEG to rule out seizure but less suspicious. Also will do TEE and check LDL A1C to finish off stroke work up. Family and pt were updated about above.  Stroke Risk Factors - recent arterial manipulation, HTN, HLD  Plan: 1. HgbA1c, fasting lipid panel 2. EEG 3. PT consult, OT consult, Speech consult 4. Echocardiogram 5. fioricet PRN for HA, continue depakote 6. Continue DAPT and heparin as per IR 7. Risk factor modification 8. Telemetry monitoring 9. Frequent neuro checks  Rosalin Hawking, MD PhD Stroke Neurology 09/21/2015 7:05 PM

## 2015-09-21 NOTE — Progress Notes (Signed)
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  Medications: Prior to Admission medications   Medication Sig Start Date End Date Taking? Authorizing Provider  amLODipine (NORVASC) 10 MG tablet Take 1 tablet (10 mg total) by mouth daily. 10/08/14  Yes Davonna Belling, MD  aspirin EC 325 MG EC tablet Take 1 tablet (325 mg total) by mouth daily. 09/17/15  Yes Shanker Kristeen Mans, MD  atorvastatin (LIPITOR) 10 MG tablet Take 10 mg by mouth daily at 6 PM.  12/01/14 12/01/15 Yes Historical Provider, MD  baclofen (LIORESAL) 10 MG tablet Take 1 tablet (10 mg total) by mouth at bedtime. 09/17/15  Yes Shanker Kristeen Mans, MD  chlorthalidone (HYGROTON) 25 MG tablet Take 25 mg by mouth daily. 07/29/15 10/27/15 Yes Historical Provider, MD  clopidogrel (PLAVIX) 75 MG tablet Take 1 tablet (75 mg total) by mouth daily. 09/17/15  Yes Shanker Kristeen Mans, MD  divalproex (DEPAKOTE) 500 MG DR tablet Take 1 tablet (500 mg total) by mouth every 12 (twelve) hours. 09/17/15  Yes Shanker Kristeen Mans, MD  escitalopram (LEXAPRO) 10 MG tablet Take 10 mg by mouth daily.  11/29/14  Yes Historical Provider, MD  lisinopril (PRINIVIL,ZESTRIL) 20 MG tablet Take 2 tablets (40 mg total) by mouth every morning. 10/08/14  Yes Davonna Belling, MD  metoprolol (LOPRESSOR) 50 MG tablet Take 2 tablets (100 mg total) by mouth 2 (two) times daily. 09/17/15  Yes Shanker Kristeen Mans, MD  fluticasone (FLONASE) 50 MCG/ACT nasal spray Place 2 sprays into the nose daily. Patient taking differently: Place 2  sprays into the nose daily as needed for allergies.  09/07/12   Cleatrice Burke, PA-C     Vital Signs: BP 129/70 mmHg  Pulse 65  Temp(Src) 98.2 F (36.8 C) (Oral)  Resp 18  Ht 5\' 8"  (1.727 m)  Wt 246 lb 4 oz (111.698 kg)  BMI 37.45 kg/m2  SpO2 97%  Physical Exam  Constitutional: He is oriented to person, place, and time.  Appropriate Tongue midline Face symmetrical  Musculoskeletal: Normal range of motion.  Neurological: He is alert and oriented to person, place, and time.  Skin: Skin is warm and dry.  Psychiatric: He has a normal mood and affect. His behavior is normal. Judgment and thought content normal.  Nursing note and vitals reviewed.   Imaging: Ct Angio Head W/cm &/or Wo Cm  09/20/2015  CLINICAL DATA:  Initial evaluation for acute left-sided weakness status post pipeline device placement for pericallosal aneurysm. Symptoms now all resolved. EXAM: CT ANGIOGRAPHY HEAD AND NECK TECHNIQUE: Multidetector CT imaging of the head and neck was performed using the standard protocol during bolus administration of intravenous contrast. Multiplanar CT image reconstructions and MIPs were obtained to evaluate the vascular anatomy. Carotid stenosis measurements (when applicable) are obtained utilizing NASCET criteria, using the distal internal carotid diameter as the denominator. CONTRAST:  50 cc of Isovue 370. COMPARISON:  Prior studies from earlier same day. FINDINGS: CTA NECK Aortic arch: Visualized aortic arch of normal caliber with normal branch pattern. No high-grade stenosis at the origin of the great vessels. Visualized subclavian arteries widely  patent. Right carotid system: Right common carotid artery patent from its origin to the bifurcation. Mild noncalcified plaque about the right bifurcation without stenosis. Right ICA widely patent from the bifurcation to the skullbase. No stenosis, dissection, or vascular occlusion within the right carotid artery system. Left carotid system: Left  common carotid artery patent from its origin to the bifurcation. Minimal calcified noncalcified plaque about the left bifurcation without stenosis. Left ICA patent from the bifurcation to the skullbase. No stenosis, dissection, or vascular occlusion within the left carotid artery system. Vertebral arteries:Both vertebral arteries arise from the subclavian arteries. Left vertebral artery is dominant. Vertebral arteries patent without stenosis, dissection, or occlusion. 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 No worrisome lytic or blastic osseous lesions. Other neck: Visualized lungs are clear. Visualized superior mediastinum within normal limits. Few scattered subcentimeter hypodense nodules noted within the thyroid gland, of doubtful clinical significance. No adenopathy within the neck. No acute soft tissue abnormality. CTA HEAD Anterior circulation: Petrous segments widely patent bilaterally. Minimal atheromatous plaque within the cavernous ICAs without stenosis. Supraclinoid segments widely patent. A1 segments well opacified. Dominant right ACA which is widely patent. 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 Persistent filling of the aneurysm which measures 8 x 7 x 9 mm. Flow seen distally the pipeline device within the distal pericallosal branches. M1 segments patent without stenosis or occlusion. MCA bifurcations normal. No proximal M2 branch occlusion or stenosis. Distal MCA branches well opacified. Posterior circulation: Vertebral arteries patent to the vertebrobasilar junction. Left vertebral artery dominant. Posterior inferior cerebral arteries patent. Basilar artery widely patent. Superior cerebellar arteries and posterior cerebral arteries well opacified. Prominent right posterior communicating artery with hypoplastic right P1 segment. Venous sinuses: No  evidence for venous sinus thrombosis. Anatomic variants: No significant anatomic variant. Delayed phase: Not performed. IMPRESSION: 1. No large or proximal arterial branch occlusion. No complication status post recent catheter directed arteriogram. 2. 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 3. Mild intracranial and extracranial atheromatous disease as above. Electronically Signed   By: Jeannine Boga M.D.   On: 09/20/2015 22:49   Ct Head Wo Contrast  09/20/2015  ADDENDUM REPORT: 09/20/2015 21:54 ADDENDUM: These results were called by telephone at the time of interpretation on 09/20/2015 at 9:49 pm to Dr. Wallie Char, who verbally acknowledged these results. Electronically Signed   By: Logan Bores M.D.   On: 09/20/2015 21:54  09/20/2015  CLINICAL DATA:  Code stroke. Left-sided facial droop. Status post endovascular treatment of pericallosal aneurysm earlier today. EXAM: CT HEAD WITHOUT CONTRAST TECHNIQUE: Contiguous axial images were obtained from the base of the skull through the vertex without intravenous contrast. COMPARISON:  Head MRI 09/15/2015 and CT 09/14/2015 FINDINGS: There has been interval placement of a pipeline stent for treatment of the previously described pericallosal aneurysm. No acute cortical infarct, intracranial hemorrhage, mass, midline shift, or extra-axial fluid collection is identified. Scattered, small foci of hypoattenuation in the cerebral white matter bilaterally are similar to the prior CT and nonspecific but compatible with mild chronic small vessel ischemic disease. Ventricles and sulci are normal in size. Orbits are unremarkable. Mild paranasal sinus mucosal thickening and prior left mastoidectomy are noted. IMPRESSION: 1. No evidence of acute intracranial abnormality following recent endovascular pericallosal aneurysm treatment. 2. Mild chronic small vessel ischemic disease. Electronically Signed: By: Logan Bores M.D. On: 09/20/2015 21:47    Ct Angio Neck W/cm &/  or Wo/cm  09/20/2015  CLINICAL DATA:  Initial evaluation for acute left-sided weakness status post pipeline device placement for pericallosal aneurysm. Symptoms now all resolved. EXAM: CT ANGIOGRAPHY HEAD AND NECK TECHNIQUE: Multidetector CT imaging of the head and neck was performed using the standard protocol during bolus administration of intravenous contrast. Multiplanar CT image reconstructions and MIPs were obtained to evaluate the vascular anatomy. Carotid stenosis measurements (when applicable) are obtained utilizing NASCET criteria, using the distal internal carotid diameter as the denominator. CONTRAST:  50 cc of Isovue 370. COMPARISON:  Prior studies from earlier same day. FINDINGS: CTA NECK Aortic arch: Visualized aortic arch of normal caliber with normal branch pattern. No high-grade stenosis at the origin of the great vessels. Visualized subclavian arteries widely patent. Right carotid system: Right common carotid artery patent from its origin to the bifurcation. Mild noncalcified plaque about the right bifurcation without stenosis. Right ICA widely patent from the bifurcation to the skullbase. No stenosis, dissection, or vascular occlusion within the right carotid artery system. Left carotid system: Left common carotid artery patent from its origin to the bifurcation. Minimal calcified noncalcified plaque about the left bifurcation without stenosis. Left ICA patent from the bifurcation to the skullbase. No stenosis, dissection, or vascular occlusion within the left carotid artery system. Vertebral arteries:Both vertebral arteries arise from the subclavian arteries. Left vertebral artery is dominant. Vertebral arteries patent without stenosis, dissection, or occlusion. 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 No worrisome lytic or blastic osseous lesions. Other neck: Visualized lungs are  clear. Visualized superior mediastinum within normal limits. Few scattered subcentimeter hypodense nodules noted within the thyroid gland, of doubtful clinical significance. No adenopathy within the neck. No acute soft tissue abnormality. CTA HEAD Anterior circulation: Petrous segments widely patent bilaterally. Minimal atheromatous plaque within the cavernous ICAs without stenosis. Supraclinoid segments widely patent. A1 segments well opacified. Dominant right ACA which is widely patent. 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 Persistent filling of the aneurysm which measures 8 x 7 x 9 mm. Flow seen distally the pipeline device within the distal pericallosal branches. M1 segments patent without stenosis or occlusion. MCA bifurcations normal. No proximal M2 branch occlusion or stenosis. Distal MCA branches well opacified. Posterior circulation: Vertebral arteries patent to the vertebrobasilar junction. Left vertebral artery dominant. Posterior inferior cerebral arteries patent. Basilar artery widely patent. Superior cerebellar arteries and posterior cerebral arteries well opacified. Prominent right posterior communicating artery with hypoplastic right P1 segment. Venous sinuses: No evidence for venous sinus thrombosis. Anatomic variants: No significant anatomic variant. Delayed phase: Not performed. IMPRESSION: 1. No large or proximal arterial branch occlusion. No complication status post recent catheter directed arteriogram. 2. 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 3. Mild intracranial and extracranial atheromatous disease as above. Electronically Signed   By: Jeannine Boga M.D.   On: 09/20/2015 22:49   Mr Brain Wo Contrast  09/21/2015  CLINICAL DATA:  Pipeline stent for pericallosal aneurysm diversion. Facial numbness. EXAM: MRI HEAD WITHOUT CONTRAST TECHNIQUE: Multiplanar, multiecho pulse sequences of the  brain and surrounding structures were obtained without intravenous contrast. COMPARISON:  MRI 09/15/2015 FINDINGS: Small, 3 mm areas of restricted diffusion in the high right frontal cortex, high right parietal cortex, and posterior right parietal cortex. These are most compatible with small embolic acute infarcts. No other acute infarct Artifact from pipeline stent in the anterior cerebral artery on the left. Ventricle size  is normal.  Cerebral volume is normal Scattered small hyperintense lesions in the cerebral white matter bilaterally and in the pons most compatible with chronic microvascular ischemia. Mild chronic ischemia in the thalamus bilaterally. No MR evidence of acute subarachnoid hemorrhage. Previous MRI demonstrated susceptibility in the left caudate head which is no longer visualized likely due to prior study being done on 3 tesla equipment in the current study 1.5 tesla equipment. Possible cavernoma. Mucosal edema in the paranasal sinuses bilaterally. 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 Pipeline stent left anterior cerebral artery. No evidence of acute intracranial hemorrhage. Electronically Signed   By: Franchot Gallo M.D.   On: 09/21/2015 14:21    Labs:  CBC:  Recent Labs  07/28/15 0350  09/14/15 2049 09/14/15 2056 09/20/15 0905 09/21/15 0516  WBC 10.4  --  12.1*  --  11.8* 11.5*  HGB 15.0  < > 15.1 16.3 13.7 12.1*  HCT 44.7  < > 43.4 48.0 41.5 36.5*  PLT 364  --  377  --  306 235  < > = values in this interval not displayed.  COAGS:  Recent Labs  09/14/15 2049 09/20/15 0905  INR 1.10 0.99  APTT 25 26    BMP:  Recent Labs  09/14/15 2049 09/14/15 2056 09/20/15 0905 09/21/15 0516  NA 136 139 140 139  K 3.5 3.5 4.0 3.7  CL 100* 101 104 106  CO2 24  --  24 23  GLUCOSE 222* 215* 104* 93  BUN 26* 25* 16 8  CALCIUM 8.9  --  9.1 7.9*  CREATININE 1.17 1.10 0.94 0.80  GFRNONAA >60  --  >60  >60  GFRAA >60  --  >60 >60    LIVER FUNCTION TESTS:  Recent Labs  09/14/15 2049  BILITOT 0.7  AST 21  ALT 26  ALKPHOS 74  PROT 7.6  ALBUMIN 3.9    Assessment and Plan:  Pericallosal aneurysm embolization 4/24 in IR Pt doing well Dr Estanislado Pandy to review imaging of new MRI Brain plan for dc in am  Electronically Signed: Taleah Bellantoni A 09/21/2015, 2:32 PM   I spent a total of 15 Minutes at the the patient's bedside AND on the patient's hospital floor or unit, greater than 50% of which was counseling/coordinating care for pericollosal aneurysm embo

## 2015-09-21 NOTE — Progress Notes (Signed)
1 Day Post-Op  Subjective: Post procedure day 1. C/O 8/10 rt temporal headache ,trobbing .Says is like the usual migraines he gets.Preceded by aura of visual blurring and oral numbness and UE paresthesia, Lt> RT. No further Lt UE weakness or  facial droop. Able to tolerate clear liquids. No breathing difficulties or chest pains.   Objective: Vital signs in last 24 hours: Temp:  [97.3 F (36.3 C)-98.8 F (37.1 C)] 97.8 F (36.6 C) (04/25 0400) Pulse Rate:  [61-78] 72 (04/25 0700) Resp:  [12-23] 20 (04/25 0700) BP: (102-141)/(56-109) 128/63 mmHg (04/25 0700) SpO2:  [92 %-100 %] 95 % (04/25 0700) Arterial Line BP: (70-156)/(52-84) 150/61 mmHg (04/25 0400) Weight:  [246 lb 4 oz (111.698 kg)] 246 lb 4 oz (111.698 kg) (04/24 0904)    Intake/Output from previous day: 04/24 0701 - 04/25 0700 In: 338.1 [I.V.:338.1] Out: 1980 [Urine:1950; Blood:30] Intake/Output this shift: Total I/O In: 338.1 [I.V.:338.1] Out: 1980 [Urine:1950; Blood:30]  On exam. VSS  BP 130s/70. HR 70s SR PAO2 >95% RA.   Alert, awake oriented to time ,place and space.Marland Kitchen  Speech minimally dysarthric  intermittently .but comprehension clear.Marland Kitchen  PEARLA..51mmRT = LT  EOMS full..No nystagmus  Visual Fields. full  No Facial asymmetry  Tongue Midline..  Motor..           NO drift of outstretched arms..          Power.5/5 Proximally and distally all four extremities..  Fine motor and coordination to finger to nose equal..Decreased dexteritity with fine finger movements of the left index and ring fingers.  Gait Not tested  Romberg Not tested  Heel to toe.Not tested.  Rt groin bruised but on palpable hematoma  Lab Results:   Recent Labs  09/20/15 0905 09/21/15 0516  WBC 11.8* 11.5*  HGB 13.7 12.1*  HCT 41.5 36.5*  PLT 306 235   BMET  Recent Labs  09/20/15 0905 09/21/15 0516  NA 140 139  K 4.0 3.7  CL 104 106  CO2 24 23  GLUCOSE 104* 93  BUN 16 8  CREATININE 0.94 0.80  CALCIUM  9.1 7.9*   PT/INR  Recent Labs  09/20/15 0905  LABPROT 13.3  INR 0.99   ABG No results for input(s): PHART, HCO3 in the last 72 hours.  Invalid input(s): PCO2, PO2  Studies/Results: Ct Angio Head W/cm &/or Wo Cm  09/20/2015  CLINICAL DATA:  Initial evaluation for acute left-sided weakness status post pipeline device placement for pericallosal aneurysm. Symptoms now all resolved. EXAM: CT ANGIOGRAPHY HEAD AND NECK TECHNIQUE: Multidetector CT imaging of the head and neck was performed using the standard protocol during bolus administration of intravenous contrast. Multiplanar CT image reconstructions and MIPs were obtained to evaluate the vascular anatomy. Carotid stenosis measurements (when applicable) are obtained utilizing NASCET criteria, using the distal internal carotid diameter as the denominator. CONTRAST:  50 cc of Isovue 370. COMPARISON:  Prior studies from earlier same day. FINDINGS: CTA NECK Aortic arch: Visualized aortic arch of normal caliber with normal branch pattern. No high-grade stenosis at the origin of the great vessels. Visualized subclavian arteries widely patent. Right carotid system: Right common carotid artery patent from its origin to the bifurcation. Mild noncalcified plaque about the right bifurcation without stenosis. Right ICA widely patent from the bifurcation to the skullbase. No stenosis, dissection, or vascular occlusion within the right carotid artery system. Left carotid system: Left common carotid artery patent from its origin to the bifurcation. Minimal calcified noncalcified plaque about the left bifurcation  without stenosis. Left ICA patent from the bifurcation to the skullbase. No stenosis, dissection, or vascular occlusion within the left carotid artery system. Vertebral arteries:Both vertebral arteries arise from the subclavian arteries. Left vertebral artery is dominant. Vertebral arteries patent without stenosis, dissection, or occlusion. Skeleton:  Moderate multilevel degenerative spondylolysis throughout the cervical spine, most prevalent at C4-5 through C7-T1. No worrisome lytic or blastic osseous lesions. Other neck: Visualized lungs are clear. Visualized superior mediastinum within normal limits. Few scattered subcentimeter hypodense nodules noted within the thyroid gland, of doubtful clinical significance. No adenopathy within the neck. No acute soft tissue abnormality. CTA HEAD Anterior circulation: Petrous segments widely patent bilaterally. Minimal atheromatous plaque within the cavernous ICAs without stenosis. Supraclinoid segments widely patent. A1 segments well opacified. Dominant right ACA which is widely patent. 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 Persistent filling of the aneurysm which measures 8 x 7 x 9 mm. Flow seen distally the pipeline device within the distal pericallosal branches. M1 segments patent without stenosis or occlusion. MCA bifurcations normal. No proximal M2 branch occlusion or stenosis. Distal MCA branches well opacified. Posterior circulation: Vertebral arteries patent to the vertebrobasilar junction. Left vertebral artery dominant. Posterior inferior cerebral arteries patent. Basilar artery widely patent. Superior cerebellar arteries and posterior cerebral arteries well opacified. Prominent right posterior communicating artery with hypoplastic right P1 segment. Venous sinuses: No evidence for venous sinus thrombosis. Anatomic variants: No significant anatomic variant. Delayed phase: Not performed. IMPRESSION: 1. No large or proximal arterial branch occlusion. No complication status post recent catheter directed arteriogram. 2. 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 3. Mild intracranial and extracranial atheromatous disease as above. Electronically Signed   By: Jeannine Boga M.D.   On: 09/20/2015 22:49   Ct Head Wo  Contrast  09/20/2015  ADDENDUM REPORT: 09/20/2015 21:54 ADDENDUM: These results were called by telephone at the time of interpretation on 09/20/2015 at 9:49 pm to Dr. Wallie Char, who verbally acknowledged these results. Electronically Signed   By: Logan Bores M.D.   On: 09/20/2015 21:54  09/20/2015  CLINICAL DATA:  Code stroke. Left-sided facial droop. Status post endovascular treatment of pericallosal aneurysm earlier today. EXAM: CT HEAD WITHOUT CONTRAST TECHNIQUE: Contiguous axial images were obtained from the base of the skull through the vertex without intravenous contrast. COMPARISON:  Head MRI 09/15/2015 and CT 09/14/2015 FINDINGS: There has been interval placement of a pipeline stent for treatment of the previously described pericallosal aneurysm. No acute cortical infarct, intracranial hemorrhage, mass, midline shift, or extra-axial fluid collection is identified. Scattered, small foci of hypoattenuation in the cerebral white matter bilaterally are similar to the prior CT and nonspecific but compatible with mild chronic small vessel ischemic disease. Ventricles and sulci are normal in size. Orbits are unremarkable. Mild paranasal sinus mucosal thickening and prior left mastoidectomy are noted. IMPRESSION: 1. No evidence of acute intracranial abnormality following recent endovascular pericallosal aneurysm treatment. 2. Mild chronic small vessel ischemic disease. Electronically Signed: By: Logan Bores M.D. On: 09/20/2015 21:47   Ct Angio Neck W/cm &/or Wo/cm  09/20/2015  CLINICAL DATA:  Initial evaluation for acute left-sided weakness status post pipeline device placement for pericallosal aneurysm. Symptoms now all resolved. EXAM: CT ANGIOGRAPHY HEAD AND NECK TECHNIQUE: Multidetector CT imaging of the head and neck was performed using the standard protocol during bolus administration of intravenous contrast. Multiplanar CT image reconstructions and MIPs were obtained to evaluate the vascular  anatomy. Carotid stenosis  measurements (when applicable) are obtained utilizing NASCET criteria, using the distal internal carotid diameter as the denominator. CONTRAST:  50 cc of Isovue 370. COMPARISON:  Prior studies from earlier same day. FINDINGS: CTA NECK Aortic arch: Visualized aortic arch of normal caliber with normal branch pattern. No high-grade stenosis at the origin of the great vessels. Visualized subclavian arteries widely patent. Right carotid system: Right common carotid artery patent from its origin to the bifurcation. Mild noncalcified plaque about the right bifurcation without stenosis. Right ICA widely patent from the bifurcation to the skullbase. No stenosis, dissection, or vascular occlusion within the right carotid artery system. Left carotid system: Left common carotid artery patent from its origin to the bifurcation. Minimal calcified noncalcified plaque about the left bifurcation without stenosis. Left ICA patent from the bifurcation to the skullbase. No stenosis, dissection, or vascular occlusion within the left carotid artery system. Vertebral arteries:Both vertebral arteries arise from the subclavian arteries. Left vertebral artery is dominant. Vertebral arteries patent without stenosis, dissection, or occlusion. 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 No worrisome lytic or blastic osseous lesions. Other neck: Visualized lungs are clear. Visualized superior mediastinum within normal limits. Few scattered subcentimeter hypodense nodules noted within the thyroid gland, of doubtful clinical significance. No adenopathy within the neck. No acute soft tissue abnormality. CTA HEAD Anterior circulation: Petrous segments widely patent bilaterally. Minimal atheromatous plaque within the cavernous ICAs without stenosis. Supraclinoid segments widely patent. A1 segments well opacified. Dominant right ACA which is  widely patent. 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 Persistent filling of the aneurysm which measures 8 x 7 x 9 mm. Flow seen distally the pipeline device within the distal pericallosal branches. M1 segments patent without stenosis or occlusion. MCA bifurcations normal. No proximal M2 branch occlusion or stenosis. Distal MCA branches well opacified. Posterior circulation: Vertebral arteries patent to the vertebrobasilar junction. Left vertebral artery dominant. Posterior inferior cerebral arteries patent. Basilar artery widely patent. Superior cerebellar arteries and posterior cerebral arteries well opacified. Prominent right posterior communicating artery with hypoplastic right P1 segment. Venous sinuses: No evidence for venous sinus thrombosis. Anatomic variants: No significant anatomic variant. Delayed phase: Not performed. IMPRESSION: 1. No large or proximal arterial branch occlusion. No complication status post recent catheter directed arteriogram. 2. 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 3. Mild intracranial and extracranial atheromatous disease as above. Electronically Signed   By: Jeannine Boga M.D.   On: 09/20/2015 22:49    Anti-infectives: Anti-infectives    None      Assessment/Plan: S/pPericallosal aneurysm treatment with piopeline flow diverter. Plan . 1.Low dose iv heparin. 2.MRI brain withouyt. 3.Antiplatelets. 4.Advance diet. 5.D/C foley.  DR Keturah Shavers, Fritz Pickerel 09/21/2015

## 2015-09-22 ENCOUNTER — Inpatient Hospital Stay (HOSPITAL_COMMUNITY): Payer: Managed Care, Other (non HMO)

## 2015-09-22 DIAGNOSIS — I10 Essential (primary) hypertension: Secondary | ICD-10-CM | POA: Insufficient documentation

## 2015-09-22 DIAGNOSIS — R0682 Tachypnea, not elsewhere classified: Secondary | ICD-10-CM

## 2015-09-22 DIAGNOSIS — G43109 Migraine with aura, not intractable, without status migrainosus: Secondary | ICD-10-CM

## 2015-09-22 DIAGNOSIS — I671 Cerebral aneurysm, nonruptured: Principal | ICD-10-CM

## 2015-09-22 DIAGNOSIS — I63411 Cerebral infarction due to embolism of right middle cerebral artery: Secondary | ICD-10-CM

## 2015-09-22 DIAGNOSIS — I63421 Cerebral infarction due to embolism of right anterior cerebral artery: Secondary | ICD-10-CM | POA: Insufficient documentation

## 2015-09-22 DIAGNOSIS — D62 Acute posthemorrhagic anemia: Secondary | ICD-10-CM

## 2015-09-22 DIAGNOSIS — R7303 Prediabetes: Secondary | ICD-10-CM

## 2015-09-22 DIAGNOSIS — R29810 Facial weakness: Secondary | ICD-10-CM

## 2015-09-22 LAB — BASIC METABOLIC PANEL
Anion gap: 8 (ref 5–15)
BUN: 8 mg/dL (ref 6–20)
CHLORIDE: 105 mmol/L (ref 101–111)
CO2: 27 mmol/L (ref 22–32)
Calcium: 8.3 mg/dL — ABNORMAL LOW (ref 8.9–10.3)
Creatinine, Ser: 0.88 mg/dL (ref 0.61–1.24)
GFR calc Af Amer: >60 mL/min (ref >60)
GFR calc non Af Amer: >60 mL/min (ref >60)
GLUCOSE: 89 mg/dL (ref 65–99)
POTASSIUM: 3.8 mmol/L (ref 3.5–5.1)
Sodium: 140 mmol/L (ref 135–145)

## 2015-09-22 LAB — LIPID PANEL
CHOL/HDL RATIO: 4.6 ratio
CHOLESTEROL: 158 mg/dL (ref 0–200)
HDL: 34 mg/dL — ABNORMAL LOW (ref >40)
LDL Cholesterol: 95 mg/dL (ref 0–99)
Triglycerides: 145 mg/dL (ref <150)
VLDL: 29 mg/dL (ref 0–40)

## 2015-09-22 LAB — CBC
HEMATOCRIT: 37.5 % — AB (ref 39.0–52.0)
HEMOGLOBIN: 12.1 g/dL — AB (ref 13.0–17.0)
MCH: 28.1 pg (ref 26.0–34.0)
MCHC: 32.3 g/dL (ref 30.0–36.0)
MCV: 87 fL (ref 78.0–100.0)
Platelets: 274 10*3/uL (ref 150–400)
RBC: 4.31 MIL/uL (ref 4.22–5.81)
RDW: 13.6 % (ref 11.5–15.5)
WBC: 9.5 10*3/uL (ref 4.0–10.5)

## 2015-09-22 NOTE — Anesthesia Postprocedure Evaluation (Signed)
Anesthesia Post Note  Patient: [name]  Procedure(s) Performed: Procedure(s) (LRB): EMBOLIZATION         (RADIOLOGY WITH ANESTHESIA) (N/A)  Patient location during evaluation: PACU Anesthesia Type: General Level of consciousness: awake and alert Pain management: pain level controlled Vital Signs Assessment: post-procedure vital signs reviewed and stable Respiratory status: spontaneous breathing, nonlabored ventilation, respiratory function stable and patient connected to nasal cannula oxygen Cardiovascular status: blood pressure returned to baseline and stable Postop Assessment: no signs of nausea or vomiting Anesthetic complications: no    Last Vitals:  Filed Vitals:   09/22/15 0800 09/22/15 0900  BP: 133/69 152/73  Pulse: 80 84  Temp: 37.2 C   Resp: 21 25    Last Pain:  Filed Vitals:   09/22/15 0942  PainSc: Asleep                 Montez Hageman

## 2015-09-22 NOTE — Progress Notes (Signed)
Rehab Admissions Coordinator Note:  Patient was screened by Cleatrice Burke for appropriateness for an Inpatient Acute Rehab Consult per PT recommendation. Discussed with P.T. Who has concerns for d/c home alone at this level.  At this time, we are recommending Inpatient Rehab consult. Please place order if you would like evaluation for a possible inpt rehab admission.  Cleatrice Burke 09/22/2015, 2:28 PM  I can be reached at [phone].

## 2015-09-22 NOTE — Evaluation (Signed)
Clinical/Bedside Swallow Evaluation Patient Details  Name: RAYBON DEDOMINICIS MRN: [id number] Date of Birth: 29-Mar-1956  Today's Date: 09/22/2015 Time: SLP Start Time (ACUTE ONLY): 1420 SLP Stop Time (ACUTE ONLY): 1440 SLP Time Calculation (min) (ACUTE ONLY): 20 min  Past Medical History:  Past Medical History  Diagnosis Date  . Chest pain, atypical     12/2003:  negative cardiolyte  . HTN (hypertension)   . HLD (hyperlipidemia)   . Depression   . Hx of tear of ACL (anterior cruciate ligament)     right  . History of meniscal tear     bilateral  . Erectile dysfunction   . Insomnia   . Eczema   . Olecranon bursitis of left elbow 10/2009    s/p I&D by Dr Maxie Better, initially assessed by Dr. Nori Riis   . Allergy   . Anxiety   . Cataract   . Prediabetes   . TIA (transient ischemic attack)    Past Surgical History:  Past Surgical History  Procedure Laterality Date  . Skin tag removal      11 removed  . Knee arthroscopy    . Incise and drain abcess      L elbow due to cellulitis/bursitis  . Inner ear surgery    . Radiology with anesthesia N/A 09/20/2015    Procedure: EMBOLIZATION         (RADIOLOGY WITH ANESTHESIA);  Surgeon: Luanne Bras, MD;  Location: [facility];  Service: Radiology;  Laterality: N/A;   HPI:  60 y.o. male with a past medical history of depression, anxiety, hyperlipidemia, hypertension, prediabetes, TIA last month who comes emergency department with complaints of facial numbness this evening. MRI Three small areas of acute infarct in the right MCA territory likely areas of embolic infarction   Assessment / Plan / Recommendation Clinical Impression  Left facial weakness, however no difficulty masticating and transiting solid bolus. No indications of pharyngeal dysphagia throughout assessment.     Aspiration Risk   (mild-mod)    Diet Recommendation Regular;Thin liquid   Liquid Administration via: Straw;Cup Medication Administration: Whole meds with  liquid Supervision: Patient able to self feed Compensations: Slow rate;Small sips/bites Postural Changes: Seated upright at 90 degrees    Other  Recommendations Oral Care Recommendations: Oral care BID   Follow up Recommendations  None    Frequency and Duration min 2x/week  2 weeks       Prognosis Prognosis for Safe Diet Advancement: Good      Swallow Study   General HPI: 60 y.o. male with a past medical history of depression, anxiety, hyperlipidemia, hypertension, prediabetes, TIA last month who comes emergency department with complaints of facial numbness this evening. MRI Three small areas of acute infarct in the right MCA territory likely areas of embolic infarction Type of Study: Bedside Swallow Evaluation Previous Swallow Assessment:  (none) Diet Prior to this Study: Regular;Thin liquids Temperature Spikes Noted: No Respiratory Status: Room air History of Recent Intubation: No Behavior/Cognition: Alert;Cooperative;Pleasant mood Oral Cavity Assessment: Within Functional Limits Oral Care Completed by SLP: No Oral Cavity - Dentition: Adequate natural dentition Vision: Impaired for self-feeding (decreased vision in left field of view) Self-Feeding Abilities: Able to feed self Patient Positioning: Upright in bed Baseline Vocal Quality: Normal Volitional Cough: Strong Volitional Swallow: Able to elicit    Oral/Motor/Sensory Function Overall Oral Motor/Sensory Function: Moderate impairment Facial ROM: Reduced left Facial Symmetry: Abnormal symmetry left Facial Strength: Reduced left Facial Sensation: Within Functional Limits Lingual ROM: Within Functional Limits Lingual Symmetry:  Within Functional Limits Lingual Strength: Within Functional Limits Velum: Within Functional Limits Mandible: Within Functional Limits   Ice Chips Ice chips: Not tested   Thin Liquid Thin Liquid: Within functional limits Presentation: Straw    Nectar Thick Nectar Thick Liquid: Not tested    Honey Thick Honey Thick Liquid: Not tested   Puree Puree: Not tested   Solid   GO   Solid: Within functional limits        Mick Sell Orbie Pyo 09/22/2015,3:39 PM  Orbie Pyo Colvin Caroli.Ed [employer] [phone]

## 2015-09-22 NOTE — Progress Notes (Signed)
STROKE TEAM PROGRESS NOTE   SUBJECTIVE (INTERVAL HISTORY) Ex-wife is at bedside. He is getting EEG done and sitting in chair. Still complains of HA and on/off facial and left arm numbness but weakness persistent. Clinically much improved.    OBJECTIVE Temp:  [97.9 F (36.6 C)-98.9 F (37.2 C)] 98.9 F (37.2 C) (04/26 0800) Pulse Rate:  [63-84] 84 (04/26 0900) Cardiac Rhythm:  [-] Normal sinus rhythm (04/26 0800) Resp:  [16-27] 25 (04/26 0900) BP: (91-152)/(44-86) 152/73 mmHg (04/26 0900) SpO2:  [91 %-100 %] 98 % (04/26 0900)  CBC:  Recent Labs Lab 09/20/15 0905 09/21/15 0516 09/22/15 0354  WBC 11.8* 11.5* 9.5  NEUTROABS 7.5 8.2*  --   HGB 13.7 12.1* 12.1*  HCT 41.5 36.5* 37.5*  MCV 87.6 85.5 87.0  PLT 306 235 [id number]    Basic Metabolic Panel:  Recent Labs Lab 09/21/15 0516 09/22/15 0354  NA 139 140  K 3.7 3.8  CL 106 105  CO2 23 27  GLUCOSE 93 89  BUN 8 8  CREATININE 0.80 0.88  CALCIUM 7.9* 8.3*    Lipid Panel:    Component Value Date/Time   CHOL 158 09/22/2015 0354   TRIG 145 09/22/2015 0354   HDL 34* 09/22/2015 0354   CHOLHDL 4.6 09/22/2015 0354   VLDL 29 09/22/2015 0354   LDLCALC 95 09/22/2015 0354   HgbA1c:  Lab Results  Component Value Date   HGBA1C * 11/19/2009    5.8 (NOTE)                                                                       According to the ADA Clinical Practice Recommendations for 2011, when HbA1c is used as a screening test:   >=6.5%   Diagnostic of Diabetes Mellitus           (if abnormal result  is confirmed)  5.7-6.4%   Increased risk of developing Diabetes Mellitus  References:Diagnosis and Classification of Diabetes Mellitus,Diabetes [id number] 1):S62-S69 and Standards of Medical Care in         Diabetes - 2011,Diabetes Care,2011,34  (Suppl 1):S11-S61.   Urine Drug Screen:    Component Value Date/Time   LABOPIA NEG 06/07/2006 0039   COCAINSCRNUR NEG 06/07/2006 0039   LABBENZ NEG 06/07/2006 0039   AMPHETMU NEG  06/07/2006 0039      IMAGING  Ct Head Wo Contrast 09/20/2015  1. No evidence of acute intracranial abnormality following recent endovascular pericallosal aneurysm treatment. 2. Mild chronic small vessel ischemic disease.   Ct Angio Head & Neck W/cm &/or Wo/cm 09/20/2015  1. No large or proximal arterial branch occlusion. No complication status post recent catheter directed arteriogram. 2. 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 3. Mild intracranial and extracranial atheromatous disease as above.   Mr Brain Wo Contrast 09/21/2015  Three small areas of acute infarct in the right MCA territory likely areas of embolic infarction. Pipeline stent left anterior cerebral artery. No evidence of acute intracranial hemorrhage.   TTE - pending   PHYSICAL EXAM Physical exam  Temp:  [97.9 F (36.6 C)-99 F (37.2 C)] 98.1 F (36.7 C) (04/26 1600) Pulse Rate:  [59-84] 59 (04/26 1200) Resp:  [14-27] 14 (04/26 1200) BP: (107-152)/(49-86) 123/63 mmHg (04/26  1200) SpO2:  [95 %-100 %] 95 % (04/26 1200)  General - Well nourished, well developed, in no apparent distress.  Ophthalmologic - Fundi not visualized due to eye movement.  Cardiovascular - Regular rate and rhythm with no murmur.  Mental Status -  Level of arousal and orientation to time, place, and person were intact. Language including expression, naming, repetition, comprehension was assessed and found intact. Fund of Knowledge was assessed and was intact.  Cranial Nerves II - XII - II - Visual field intact OU. III, IV, VI - Extraocular movements intact. V - Facial sensation intact bilaterally. VII - left mild facial droop. VIII - Hearing & vestibular intact bilaterally. X - Palate elevates symmetrically, mild dysarthria. XI - Chin turning & shoulder shrug intact bilaterally. XII - Tongue protrusion intact.  Motor Strength - The patient's strength was normal in all extremities except LUE bicep 4/5,  tricep 4/5, and hand grip 3/5 with dexterity difficulty and pronator drift was absent. Bulk was normal and fasciculations were absent.  Motor Tone - Muscle tone was assessed at the neck and appendages and was normal.  Reflexes - The patient's reflexes were 1+ in all extremities and he had no pathological reflexes.  Sensory - Light touch, temperature/pinprick were assessed and were symmetrical.   Coordination - The patient had normal movements in the hand with no ataxia or dysmetria. Tremor was absent.  Gait and Station - not tested due to safety concerns.   ASSESSMENT/PLAN [name] is a 60 y.o. male with history of HTN, HLD, migraine with aura, anxiety and depression presenting with intermittent left facial numbness/weakness, slurry speech and left hand weakness and numbness s/p pipeline stent placement. He did not receive IV t-PA due to recent stent placement.   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    Diet Heart Room service appropriate?: Yes; Fluid consistency:: Thin  aspirin 325 mg daily and clopidogrel 75 mg daily prior to admission, now on aspirin 325 mg daily and clopidogrel 75 mg daily. IV heparin stopped  Patient counseled to be compliant with his antithrombotic medications  Ongoing aggressive stroke risk factor management  Therapy recommendations:  CIR. Consult placed  Disposition:  pending   Right ACA aneurysm s/p pipeline  IR on board  CTA head and neck showed good flow  Complicated migraine  visual aura with tunnel vision and squiggly lines  bilateral face, lip and hands numbness  On depakote for preventive meds  On tylenol and fioricet for abortive  therapy  Hypertension  Stable  BP goal normotensive  Hyperlipidemia  Home meds:  On lipitor, resumed in hospital  LDL 95, goal < 70  Continue statin at discharge  Other Stroke Risk Factors  Obesity, Body mass index is 37.45 kg/(m^2).   Family hx stroke (mother)  Migraines with visual aura (tunnel vision and squiggly lines) with bilateral face, lip and hands numbness.  Other Active Problems  Depression on [facility] day # 2  This patient is critically ill due to aneurysm s/p pipeline, acute infarct, complicated migraine, HTN and at significant risk of neurological worsening, death form recurrent infarct, aneurysm rupture. This patient's care requires constant monitoring of vital signs, hemodynamics,  respiratory and cardiac monitoring, review of multiple databases, neurological assessment, discussion with family, other specialists and medical decision making of high complexity. I spent 35 minutes of neurocritical care time in the care of this patient.  Rosalin Hawking, MD PhD Stroke Neurology 09/22/2015 6:10 PM    To contact Stroke Continuity provider, please refer to [email or web address] After hours, contact General Neurology

## 2015-09-22 NOTE — Progress Notes (Signed)
EEG completed; results pending.    

## 2015-09-22 NOTE — Progress Notes (Signed)
2 Days Post-Op  Subjective: Post  Procedure day 2  Seen by neurology Feel clinically these represent small  stuttering strokes. Denies any H/A presently. Able to eat. IV heparin d/Ced.  Objective: Vital signs in last 24 hours: Temp:  [97.9 F (36.6 C)-98.8 F (37.1 C)] 98.8 F (37.1 C) (04/26 0400) Pulse Rate:  [63-83] 70 (04/26 0600) Resp:  [16-27] 20 (04/26 0600) BP: (91-145)/(44-86) 123/63 mmHg (04/26 0600) SpO2:  [91 %-100 %] 96 % (04/26 0600)    Intake/Output from previous day: 04/25 0701 - 04/26 0700 In: 1940 [I.V.:1940] Out: 3650 [Urine:3650] Intake/Output this shift:    On examination. VS BP 130s /70s. HR 70s to 80s.SR PAOS >95% RA.  Alert, awake oriented to time ,place and space.Marland Kitchen  Speech and comprehension clear.Marland Kitchen  PEARLA..19mm RT =LT  EOMS full.Nicki [place].   Facial asymmetry..Mild LT facial droop comes and goes.  Tongue Midline..  Motor..          Lt UE pronation drift.          Power 4/5  Lt hand grip .Difficulty in straitening his his Lt hand digits  . Fine motor and coordination to finger to nose decreased in LUE distally..  Gait Not tested  Romberg Not tested  Heel to toe.Not tested  Lab Results:   Recent Labs  09/21/15 0516 09/22/15 0354  WBC 11.5* 9.5  HGB 12.1* 12.1*  HCT 36.5* 37.5*  PLT 235 274   BMET  Recent Labs  09/21/15 0516 09/22/15 0354  NA 139 140  K 3.7 3.8  CL 106 105  CO2 23 27  GLUCOSE 93 89  BUN 8 8  CREATININE 0.80 0.88  CALCIUM 7.9* 8.3*   PT/INR  Recent Labs  09/20/15 0905  LABPROT 13.3  INR 0.99   ABG No results for input(s): PHART, HCO3 in the last 72 hours.  Invalid input(s): PCO2, PO2  Studies/Results: Ct Angio Head W/cm &/or Wo Cm  09/20/2015  CLINICAL DATA:  Initial evaluation for acute left-sided weakness status post pipeline device placement for pericallosal aneurysm. Symptoms now all resolved. EXAM: CT ANGIOGRAPHY HEAD AND NECK TECHNIQUE: Multidetector CT imaging of  the head and neck was performed using the standard protocol during bolus administration of intravenous contrast. Multiplanar CT image reconstructions and MIPs were obtained to evaluate the vascular anatomy. Carotid stenosis measurements (when applicable) are obtained utilizing NASCET criteria, using the distal internal carotid diameter as the denominator. CONTRAST:  50 cc of Isovue 370. COMPARISON:  Prior studies from earlier same day. FINDINGS: CTA NECK Aortic arch: Visualized aortic arch of normal caliber with normal branch pattern. No high-grade stenosis at the origin of the great vessels. Visualized subclavian arteries widely patent. Right carotid system: Right common carotid artery patent from its origin to the bifurcation. Mild noncalcified plaque about the right bifurcation without stenosis. Right ICA widely patent from the bifurcation to the skullbase. No stenosis, dissection, or vascular occlusion within the right carotid artery system. Left carotid system: Left common carotid artery patent from its origin to the bifurcation. Minimal calcified noncalcified plaque about the left bifurcation without stenosis. Left ICA patent from the bifurcation to the skullbase. No stenosis, dissection, or vascular occlusion within the left carotid artery system. Vertebral arteries:Both vertebral arteries arise from the subclavian arteries. Left vertebral artery is dominant. Vertebral arteries patent without stenosis, dissection, or occlusion. 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 No worrisome lytic or blastic osseous lesions. Other  neck: Visualized lungs are clear. Visualized superior mediastinum within normal limits. Few scattered subcentimeter hypodense nodules noted within the thyroid gland, of doubtful clinical significance. No adenopathy within the neck. No acute soft tissue abnormality. CTA HEAD Anterior circulation: Petrous  segments widely patent bilaterally. Minimal atheromatous plaque within the cavernous ICAs without stenosis. Supraclinoid segments widely patent. A1 segments well opacified. Dominant right ACA which is widely patent. 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 Persistent filling of the aneurysm which measures 8 x 7 x 9 mm. Flow seen distally the pipeline device within the distal pericallosal branches. M1 segments patent without stenosis or occlusion. MCA bifurcations normal. No proximal M2 branch occlusion or stenosis. Distal MCA branches well opacified. Posterior circulation: Vertebral arteries patent to the vertebrobasilar junction. Left vertebral artery dominant. Posterior inferior cerebral arteries patent. Basilar artery widely patent. Superior cerebellar arteries and posterior cerebral arteries well opacified. Prominent right posterior communicating artery with hypoplastic right P1 segment. Venous sinuses: No evidence for venous sinus thrombosis. Anatomic variants: No significant anatomic variant. Delayed phase: Not performed. IMPRESSION: 1. No large or proximal arterial branch occlusion. No complication status post recent catheter directed arteriogram. 2. 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 3. Mild intracranial and extracranial atheromatous disease as above. Electronically Signed   By: Jeannine Boga M.D.   On: 09/20/2015 22:49   Ct Head Wo Contrast  09/20/2015  ADDENDUM REPORT: 09/20/2015 21:54 ADDENDUM: These results were called by telephone at the time of interpretation on 09/20/2015 at 9:49 pm to Dr. Wallie Char, who verbally acknowledged these results. Electronically Signed   By: Logan Bores M.D.   On: 09/20/2015 21:54  09/20/2015  CLINICAL DATA:  Code stroke. Left-sided facial droop. Status post endovascular treatment of pericallosal aneurysm earlier today. EXAM: CT HEAD WITHOUT CONTRAST TECHNIQUE:  Contiguous axial images were obtained from the base of the skull through the vertex without intravenous contrast. COMPARISON:  Head MRI 09/15/2015 and CT 09/14/2015 FINDINGS: There has been interval placement of a pipeline stent for treatment of the previously described pericallosal aneurysm. No acute cortical infarct, intracranial hemorrhage, mass, midline shift, or extra-axial fluid collection is identified. Scattered, small foci of hypoattenuation in the cerebral white matter bilaterally are similar to the prior CT and nonspecific but compatible with mild chronic small vessel ischemic disease. Ventricles and sulci are normal in size. Orbits are unremarkable. Mild paranasal sinus mucosal thickening and prior left mastoidectomy are noted. IMPRESSION: 1. No evidence of acute intracranial abnormality following recent endovascular pericallosal aneurysm treatment. 2. Mild chronic small vessel ischemic disease. Electronically Signed: By: Logan Bores M.D. On: 09/20/2015 21:47   Ct Angio Neck W/cm &/or Wo/cm  09/20/2015  CLINICAL DATA:  Initial evaluation for acute left-sided weakness status post pipeline device placement for pericallosal aneurysm. Symptoms now all resolved. EXAM: CT ANGIOGRAPHY HEAD AND NECK TECHNIQUE: Multidetector CT imaging of the head and neck was performed using the standard protocol during bolus administration of intravenous contrast. Multiplanar CT image reconstructions and MIPs were obtained to evaluate the vascular anatomy. Carotid stenosis measurements (when applicable) are obtained utilizing NASCET criteria, using the distal internal carotid diameter as the denominator. CONTRAST:  50 cc of Isovue 370. COMPARISON:  Prior studies from earlier same day. FINDINGS: CTA NECK Aortic arch: Visualized aortic arch of normal caliber with normal branch pattern. No high-grade stenosis at the origin of the great vessels. Visualized subclavian arteries widely patent. Right carotid system: Right common  carotid  artery patent from its origin to the bifurcation. Mild noncalcified plaque about the right bifurcation without stenosis. Right ICA widely patent from the bifurcation to the skullbase. No stenosis, dissection, or vascular occlusion within the right carotid artery system. Left carotid system: Left common carotid artery patent from its origin to the bifurcation. Minimal calcified noncalcified plaque about the left bifurcation without stenosis. Left ICA patent from the bifurcation to the skullbase. No stenosis, dissection, or vascular occlusion within the left carotid artery system. Vertebral arteries:Both vertebral arteries arise from the subclavian arteries. Left vertebral artery is dominant. Vertebral arteries patent without stenosis, dissection, or occlusion. 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 No worrisome lytic or blastic osseous lesions. Other neck: Visualized lungs are clear. Visualized superior mediastinum within normal limits. Few scattered subcentimeter hypodense nodules noted within the thyroid gland, of doubtful clinical significance. No adenopathy within the neck. No acute soft tissue abnormality. CTA HEAD Anterior circulation: Petrous segments widely patent bilaterally. Minimal atheromatous plaque within the cavernous ICAs without stenosis. Supraclinoid segments widely patent. A1 segments well opacified. Dominant right ACA which is widely patent. 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 Persistent filling of the aneurysm which measures 8 x 7 x 9 mm. Flow seen distally the pipeline device within the distal pericallosal branches. M1 segments patent without stenosis or occlusion. MCA bifurcations normal. No proximal M2 branch occlusion or stenosis. Distal MCA branches well opacified. Posterior circulation: Vertebral arteries patent to the  vertebrobasilar junction. Left vertebral artery dominant. Posterior inferior cerebral arteries patent. Basilar artery widely patent. Superior cerebellar arteries and posterior cerebral arteries well opacified. Prominent right posterior communicating artery with hypoplastic right P1 segment. Venous sinuses: No evidence for venous sinus thrombosis. Anatomic variants: No significant anatomic variant. Delayed phase: Not performed. IMPRESSION: 1. No large or proximal arterial branch occlusion. No complication status post recent catheter directed arteriogram. 2. 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 3. Mild intracranial and extracranial atheromatous disease as above. Electronically Signed   By: Jeannine Boga M.D.   On: 09/20/2015 22:49   Mr Brain Wo Contrast  09/21/2015  CLINICAL DATA:  Pipeline stent for pericallosal aneurysm diversion. Facial numbness. EXAM: MRI HEAD WITHOUT CONTRAST TECHNIQUE: Multiplanar, multiecho pulse sequences of the brain and surrounding structures were obtained without intravenous contrast. COMPARISON:  MRI 09/15/2015 FINDINGS: Small, 3 mm areas of restricted diffusion in the high right frontal cortex, high right parietal cortex, and posterior right parietal cortex. These are most compatible with small embolic acute infarcts. No other acute infarct Artifact from pipeline stent in the anterior cerebral artery on the left. Ventricle size is normal.  Cerebral volume is normal Scattered small hyperintense lesions in the cerebral white matter bilaterally and in the pons most compatible with chronic microvascular ischemia. Mild chronic ischemia in the thalamus bilaterally. No MR evidence of acute subarachnoid hemorrhage. Previous MRI demonstrated susceptibility in the left caudate head which is no longer visualized likely due to prior study being done on 3 tesla equipment in the current study 1.5 tesla equipment. Possible cavernoma. Mucosal edema in the paranasal  sinuses bilaterally. 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 Pipeline stent left anterior cerebral artery. No evidence of acute intracranial hemorrhage. Electronically Signed   By: Franchot Gallo M.D.   On: 09/21/2015 14:21    Anti-infectives: Anti-infectives    Start  Dose/Rate Route Frequency Ordered Stop   09/20/15 0854  ceFAZolin (ANCEF) IVPB 2g/100 mL premix     2 g 200 mL/hr over 30 Minutes Intravenous 60 min pre-op 09/20/15 0854 09/20/15 1255      Assessment/Plan: s/p Procedure(s): EMBOLIZATION         (RADIOLOGY WITH ANESTHESIA) (N/A) Plan . Continue with dual antiplatelets.. Increase PO ontake.Ambulate PT/OT consult. Rec as per stroke neurology Rob Hickman 09/22/2015

## 2015-09-22 NOTE — Evaluation (Signed)
Occupational Therapy Evaluation Patient Details Name: [name] MRN: [id number] DOB: 10-06-1955 Today's Date: 09/22/2015    History of Present Illness 60 y.o. male with hx of migraine with aura, HTN, HLD, anxiety and depression admitted for HA and right ACA aneurysm s/p pipeline stent. Patient developed increased HA and MRI revealed 3 punctate acute infarcts at right frontal cortex, high right parietal cortex and posterior right parietal cortex   Clinical Impression   PT admitted with see above. Pt currently with functional limitiations due to the deficits listed below (see OT problem list). PTA independent and working full time. Pt will benefit from skilled OT to increase their independence and safety with adls and balance to allow discharge CIR . Pt with visual and balance deficits affecting all adls. Pt will need CIR to reach MOD I level.      Follow Up Recommendations  CIR    Equipment Recommendations  Other (comment) (defer to next venue)    Recommendations for Other Services Rehab consult     Precautions / Restrictions Precautions Precautions: Fall Restrictions Weight Bearing Restrictions: No      Mobility Bed Mobility Overal bed mobility: Needs Assistance Bed Mobility: Supine to Sit;Sit to Supine     Supine to sit: Mod assist Sit to supine: Independent   General bed mobility comments: reaching for therapist for (A)  Transfers Overall transfer level: Needs assistance Equipment used: None Transfers: Sit to/from Stand Sit to Stand: Min guard         General transfer comment: some instability in coming    Balance Overall balance assessment: Needs assistance Sitting-balance support: Feet supported Sitting balance-Leahy Scale: Fair     Standing balance support: During functional activity Standing balance-Leahy Scale: Fair Standing balance comment: able to perform static standing activities with increased lateral sway             High level  balance activites: Backward walking;Direction changes;Turns;Sudden stops;Head turns High Level Balance Comments: min to moderate assist performing higher level balance tasks            ADL Overall ADL's : Needs assistance/impaired   Eating/Feeding Details (indicate cue type and reason): declined food at this time Grooming: Wash/dry hands;Min guard;Standing Grooming Details (indicate cue type and reason): pt was unable to get paper towel on L side and had to reach with R UE to get paper towel after 3 failed attempts.                 Toilet Transfer: Minimal assistance;Ambulation;Regular Glass blower/designer Details (indicate cue type and reason): pt static standing to void bladder. Pt noted to have blood in urine. pt reports urgency and inability to hold urine at this time. pt states "when i have to go i have to go now" Toileting- Water quality scientist and Hygiene: Minimal assistance (standing)       Functional mobility during ADLs: Minimal assistance General ADL Comments: pt bumping into objects on L side and extending UE to help feel for environmental objects. pt completed moca assessement with 23 out 30 score indicating deficits. See visual changes     Vision Vision Assessment?: Yes Eye Alignment: Within Functional Limits Ocular Range of Motion: Impaired-to be further tested in functional context;Restricted on the left Alignment/Gaze Preference: Head tilt;Head turned Tracking/Visual Pursuits: Decreased smoothness of eye movement to LEFT superior field;Decreased smoothness of eye movement to LEFT inferior field;Requires cues, head turns, or add eye shifts to track;Impaired - to be further tested in functional context Visual Fields:  Other (comment) (L eye with L visual field ( peripheral deficits noted)) Additional Comments: Pt provided random pattern of scattered circles and asked to number 1-10 as the patient sees the circles. pt started in R superior quadrant and worked  down to R inferior quadrant then missed numbered repeating 4 omitting number 5 completely and working from L inferior laterally toward L superior quadrant and finishing in the center dot in completely central vision. pt demonstrates L peripheral deficits with R occlussion. pt demonstrates L eye dominance with testing. pt reports " ican't see to this side that well" On arrival pointing to L eye peripheral field. Pt walking into objects on L side during ambulation. Pt reports "losing" objects when testing vision and always in L peripherial field. Pt reports i have to use my R eye did you notice that. pt with normal ROM R eye with L eye occluded   Perception     Praxis      Pertinent Vitals/Pain Pain Assessment: Faces Pain Score: 10-Worst pain ever (12 out 10 ) Pain Location: R temporal area and behind R eye Pain Descriptors / Indicators: Discomfort Pain Intervention(s): Monitored during session;Repositioned;Patient requesting pain meds-RN notified     Hand Dominance Right   Extremity/Trunk Assessment Upper Extremity Assessment Upper Extremity Assessment: LUE deficits/detail LUE Deficits / Details: numbness tingling and decr gross grasp   Lower Extremity Assessment Lower Extremity Assessment: Defer to PT evaluation   Cervical / Trunk Assessment Cervical / Trunk Assessment: Normal   Communication Communication Communication: No difficulties   Cognition Arousal/Alertness: Awake/alert Behavior During Therapy: Impulsive Overall Cognitive Status: Impaired/Different from baseline Area of Impairment: Attention;Safety/judgement;Awareness   Current Attention Level: Selective     Safety/Judgement: Decreased awareness of deficits Awareness: Emergent   General Comments: MOCA provided. Pt immediately states "i did this test 2 weeks ago. I know the answers" Pt completed with score 23 out 30. Pt does report anxiety. Sister dropped daughter ( niece) oxygen tank prior to patient beginning  assessment. Pt told family " you have to get that under control because you just really scared me." pt demonstrates visual deficits with head turns and verbalizations of eye changes.    General Comments       Exercises       Shoulder Instructions      Home Living Family/patient expects to be discharged to:: Private residence Living Arrangements: Alone Available Help at Discharge: Friend(s);Available PRN/intermittently Type of Home: Apartment Home Access: Stairs to enter Entrance Stairs-Number of Steps: 14 Entrance Stairs-Rails: Left;Right;Can reach both Home Layout: One level     Bathroom Shower/Tub: Walk-in shower;Door   [employer] Toilet: Standard     Home Equipment: None   Additional Comments: Pt works at [employer] 3rd shift.  pt normally sleeps during the day and awake at night so current hospital routine has caused some sleep pattern changes for the patient      Prior Functioning/Environment Level of Independence: Independent             OT Diagnosis: Generalized weakness;Acute pain;Cognitive deficits   OT Problem List: Decreased strength;Decreased activity tolerance;Impaired balance (sitting and/or standing);Decreased safety awareness;Decreased knowledge of use of DME or AE;Decreased knowledge of precautions;Decreased cognition;Obesity;Cardiopulmonary status limiting activity;Pain;Impaired UE functional use;Decreased coordination;Impaired vision/perception   OT Treatment/Interventions: Self-care/ADL training;Therapeutic exercise;DME and/or AE instruction;Therapeutic activities;Cognitive remediation/compensation;Patient/family education;Balance training;Visual/perceptual remediation/compensation    OT Goals(Current goals can be found in the care plan section) Acute Rehab OT Goals Patient Stated Goal: none stated at this time OT  Goal Formulation: With patient Time For Goal Achievement: 10/06/15 Potential to Achieve Goals: Good  OT Frequency:  Min 2X/week   Barriers to D/C: Decreased caregiver support  sister present reports daughter will have to help. Per PT reports ex wife reports daughter is in school. Uncertain what family can (A)       Co-evaluation              End of Session Equipment Utilized During Treatment: Gait belt Nurse Communication: Mobility status;Precautions  Activity Tolerance: Patient limited by pain (reports severe HA R side of head just behind R eye) Patient left: in bed;with call bell/phone within reach;Other (comment) (SLP arriving)   Time: [id number] OT Time Calculation (min): 27 min Charges:  OT General Charges $OT Visit: 1 Procedure OT Evaluation $OT Eval Moderate Complexity: 1 Procedure OT Treatments $Cognitive Skills Development: 8-22 mins G-Codes:    Peri Maris 2015-10-15, 2:27 PM   Jeri Modena   OTR/L Pager: [phone] Office: [phone] .

## 2015-09-22 NOTE — Evaluation (Signed)
Physical Therapy Evaluation Patient Details Name: [name] MRN: [id number] DOB: Nov 19, 1955 Today's Date: 09/22/2015   History of Present Illness  60 y.o. male with hx of migraine with aura, HTN, HLD, anxiety and depression admitted for HA and right ACA aneurysm s/p pipeline stent. 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 Will need continued skilled PT to address deficits and maximize function. Will see as indicated and progress as tolerated. At this time, patient with concerning safety awareness, visual deficits, and ability to mobilize safely. Recommend further rehabilitation upon acute discharge. Recommend CIR consult.    Follow Up Recommendations CIR;Supervision/Assistance - 24 hour    Equipment Recommendations  Other (comment) (TBD)    Recommendations for Other Services       Precautions / Restrictions Precautions Precautions: Fall Restrictions Weight Bearing Restrictions: No      Mobility  Bed Mobility               General bed mobility comments: received in chair  Transfers Overall transfer level: Needs assistance Equipment used: None Transfers: Sit to/from Stand Sit to Stand: Min guard         General transfer comment: some instability in coming  Ambulation/Gait Ambulation/Gait assistance: Min guard;Min assist Ambulation Distance (Feet): 110 Feet Assistive device: None Gait Pattern/deviations: Step-through pattern;Drifts right/left Gait velocity: decreased Gait velocity interpretation: Below normal speed for age/gender General Gait Details: increased instability noted compared to previous session, additionally, patient with poor spatial awareness to the left,bumping into objects, with multiple balance checks requring min assist, one  episode of increased moderate assist for stability.   Stairs            Wheelchair Mobility    Modified Rankin (Stroke Patients Only)       Balance Overall balance assessment: Needs assistance Sitting-balance support: Feet supported Sitting balance-Leahy Scale: Fair     Standing balance support: During functional activity Standing balance-Leahy Scale: Fair Standing balance comment: able to perform static standing activities with increased lateral sway             High level balance activites: Backward walking;Direction changes;Turns;Sudden stops;Head turns High Level Balance Comments: min to moderate assist performing higher level balance tasks             Pertinent Vitals/Pain Pain Assessment: 0-10 Pain Score: 4  Pain Location: headache Pain Descriptors / Indicators: Aching Pain Intervention(s): Monitored during session    Home Living Family/patient expects to be discharged to:: Private residence Living Arrangements: Alone Available Help at Discharge: Friend(s);Available PRN/intermittently Type of Home: Apartment Home Access: Stairs to enter Entrance Stairs-Rails: Left;Right;Can reach both Entrance Stairs-Number of Steps: 14 Home Layout: One level Home Equipment: None      Prior Function Level of Independence: Independent               Hand Dominance   Dominant Hand: Right    Extremity/Trunk Assessment   Upper Extremity Assessment: Defer to OT evaluation           Lower Extremity Assessment: Overall WFL for tasks assessed      Cervical / Trunk Assessment: Normal  Communication   Communication: No difficulties  Cognition Arousal/Alertness: Awake/alert Behavior During Therapy: Impulsive Overall Cognitive Status: Impaired/Different from baseline Area of Impairment: Attention;Safety/judgement;Awareness   Current Attention Level: Selective     Safety/Judgement: Decreased  awareness of deficits Awareness: Emergent   General  Comments: patient with some impuslivity in conjunction with lability.     General Comments      Exercises        Assessment/Plan    PT Assessment Patent does not need any further PT services  PT Diagnosis Difficulty walking   PT Problem List    PT Treatment Interventions     PT Goals (Current goals can be found in the Care Plan section) Acute Rehab PT Goals Patient Stated Goal: to go home PT Goal Formulation: With patient Time For Goal Achievement: 10/06/15 Potential to Achieve Goals: Good    Frequency     Barriers to discharge        Co-evaluation               End of Session Equipment Utilized During Treatment: Gait belt Activity Tolerance: Patient tolerated treatment well Patient left: in chair;with call bell/phone within reach;with family/visitor present Nurse Communication: Mobility status         Time: 1001-1021 PT Time Calculation (min) (ACUTE ONLY): 20 min   Charges:   PT Evaluation $PT Eval Moderate Complexity: 1 Procedure     PT G CodesDuncan Dull 2015-10-21, 2:02 PM Alben Deeds, [place] DPT  [phone]

## 2015-09-22 NOTE — Procedures (Signed)
History: 60 year old male with a history of recent strokes.  Sedation: None  Technique: This is a 21 channel routine scalp EEG performed at the bedside with bipolar and monopolar montages arranged in accordance to the international 10/20 system of electrode placement. One channel was dedicated to EKG recording.    Background: The background consists of intermixed alpha and beta activities. There is a well defined posterior dominant rhythm of 9 Hz that attenuates with eye opening. There is a mild increase in delta associated with drowsiness. Sleep is briefly recorded towards end of the recording with normal appearing structures.   Photic stimulation: Physiologic driving is not performed  EEG Abnormalities: None  Clinical Interpretation: This normal EEG is recorded in the waking and sleep state. There was no seizure or seizure predisposition recorded on this study. Please note that a normal EEG does not preclude the possibility of epilepsy.   Roland Rack, MD [facility] [phone]  If 7pm- 7am, please page neurology on call as listed in [facility].

## 2015-09-22 NOTE — Care Management Note (Signed)
Case Management Note  Patient Details  Name: [name] MRN: [id number] Date of Birth: November 06, 1955  Subjective/Objective:  Pt admitted s/p pipeline stent to Rt ACA aneurysm with post procedure Rt MCA embolic infarcts.  PTA, pt independent, lives alone.                   Action/Plan: PT/OT recommending IP Rehab due to cognitive and safety deficits.  Met with pt and ex-wife at bedside; discussed therapy recommendations.  Pt agreeable to IP Rehab if needed.  Explained that we would have to get insurance approval and have bed availability--pt would need backup plan should insurance not approve or bed not available.  Ex wife states neither she nor their daughters can provide 24h care due to work/school schedules.  Pt reluctantly stated that he could find someone to stay with if he had to, but would not provide any information on who this individual is.  Rehab consult in progress currently.  Hopeful for insurance approval/bed availability, as pt unsafe to go home presently alone.    Expected Discharge Date:                  Expected Discharge Plan:  [place]  In-House Referral:     Discharge planning Services  CM Consult  Post Acute Care Choice:    Choice offered to:     DME Arranged:    DME Agency:     HH Arranged:    [place] Agency:     Status of Service:  In process, will continue to follow  Medicare Important Message Given:    Date Medicare IM Given:    Medicare IM give by:    Date Additional Medicare IM Given:    Additional Medicare Important Message give by:     If discussed at [place] of Stay Meetings, dates discussed:    Additional Comments:  Reinaldo Raddle, RN, BSN  Trauma/Neuro ICU Case Manager [phone]

## 2015-09-22 NOTE — Consult Note (Signed)
Physical Medicine and Rehabilitation Consult Reason for Consult: Right MCA aneurysm status post stent Referring Physician: Dr. Estanislado Pandy   HPI: [name] is a 60 y.o. right handed male with history of hypertension, migraine headaches. Per chart review patient lives alone independently prior to admission. One level home 14 steps to entry. Patient works at [employer]. Presented with numerous ED evaluations for headaches. Placed on Depakote for headache prevention. Admitted 09/14/2015 with facial numbness and headache. MRI of the brain showed no acute intracranial process. 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 No emergent large vessel occlusion. 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 Underwent pipeline stenting 09/20/2015 per interventional radiology. EEG is pending. 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 Neurology follow-up currently continues on aspirin and Plavix therapy. Maintain on a regular diet. Physical and occupational therapy evaluations completed with recommendations of physical medicine rehabilitation consult.   Review of Systems  Constitutional: Negative for fever and chills.  HENT: Negative for hearing loss.   Eyes: Positive for blurred vision. Negative for double vision.  Respiratory: Negative for cough and shortness of breath.   Cardiovascular: Negative for chest pain.  Gastrointestinal: Positive for constipation.  Genitourinary: Positive for urgency.  Musculoskeletal: Positive for myalgias.  Skin: Negative for rash.  Neurological: Positive for focal weakness and headaches. Negative for sensory change and seizures.  Psychiatric/Behavioral: Positive for depression. The patient has insomnia.   All other systems reviewed and are negative.  Past Medical History  Diagnosis Date  . Chest pain,  atypical     12/2003:  negative cardiolyte  . HTN (hypertension)   . HLD (hyperlipidemia)   . Depression   . Hx of tear of ACL (anterior cruciate ligament)     right  . History of meniscal tear     bilateral  . Erectile dysfunction   . Insomnia   . Eczema   . Olecranon bursitis of left elbow 10/2009    s/p I&D by Dr Maxie Better, initially assessed by Dr. Nori Riis   . Allergy   . Anxiety   . Cataract   . Prediabetes   . TIA (transient ischemic attack)    Past Surgical History  Procedure Laterality Date  . Skin tag removal      11 removed  . Knee arthroscopy    . Incise and drain abcess      L elbow due to cellulitis/bursitis  . Inner ear surgery    . Radiology with anesthesia N/A 09/20/2015    Procedure: EMBOLIZATION         (RADIOLOGY WITH ANESTHESIA);  Surgeon: Luanne Bras, MD;  Location: [facility];  Service: Radiology;  Laterality: N/A;   Family History  Problem Relation Age of Onset  . Colon cancer Neg Hx   . Rectal cancer Neg Hx   . Stomach cancer Neg Hx   . Stroke Mother   . Hypertension Mother   . Aneurysm Mother 24    Died of brain aneursym  . Heart failure Father   . Emphysema Father   . Diabetes Mellitus II Sister    Social History:  reports that he has never smoked. He has never used smokeless tobacco. He reports that he does not drink alcohol or use illicit drugs. Allergies: No Known Allergies Medications Prior to Admission  Medication Sig Dispense Refill  . amLODipine (  NORVASC) 10 MG tablet Take 1 tablet (10 mg total) by mouth daily. 30 tablet 0  . aspirin EC 325 MG EC tablet Take 1 tablet (325 mg total) by mouth daily. 30 tablet 0  . atorvastatin (LIPITOR) 10 MG tablet Take 10 mg by mouth daily at 6 PM.     . baclofen (LIORESAL) 10 MG tablet Take 1 tablet (10 mg total) by mouth at bedtime. 30 each 0  . chlorthalidone (HYGROTON) 25 MG tablet Take 25 mg by mouth daily.    . clopidogrel (PLAVIX) 75 MG tablet Take 1 tablet (75 mg total) by mouth daily. 30 tablet 0    . divalproex (DEPAKOTE) 500 MG DR tablet Take 1 tablet (500 mg total) by mouth every 12 (twelve) hours. 60 tablet 0  . escitalopram (LEXAPRO) 10 MG tablet Take 10 mg by mouth daily.     Marland Kitchen lisinopril (PRINIVIL,ZESTRIL) 20 MG tablet Take 2 tablets (40 mg total) by mouth every morning. 60 tablet 0  . metoprolol (LOPRESSOR) 50 MG tablet Take 2 tablets (100 mg total) by mouth 2 (two) times daily. 120 tablet 0  . fluticasone (FLONASE) 50 MCG/ACT nasal spray Place 2 sprays into the nose daily. (Patient taking differently: Place 2 sprays into the nose daily as needed for allergies. 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  pt normally sleeps during the day and awake at night so current hospital routine has caused some sleep pattern changes for the patient  Functional History: Prior Function Level of Independence: Independent Functional Status:  Mobility: Bed Mobility Overal bed mobility: Needs Assistance Bed Mobility: Supine to Sit, Sit to Supine Supine to sit: Mod assist Sit to supine: Independent General bed mobility comments: reaching for therapist for (A) Transfers Overall transfer level: Needs assistance Equipment used: None Transfers: Sit to/from Stand Sit to Stand: Min guard General transfer comment: some instability in coming Ambulation/Gait Ambulation/Gait assistance: Min guard, Min assist Ambulation Distance (Feet): 110 Feet Assistive device: None Gait Pattern/deviations: Step-through pattern, Drifts  right/left General Gait Details: increased instability noted compared to previous session, additionally, patient with poor spatial awareness to the left,bumping into objects, with multiple balance checks requring min assist, one episode of increased moderate assist for stability.  Gait velocity: decreased Gait velocity interpretation: Below normal speed for age/gender    ADL: ADL Overall ADL's : Needs assistance/impaired Eating/Feeding Details (indicate cue type and reason): declined food at this time Grooming: Wash/dry hands, Min guard, Standing Grooming Details (indicate cue type and reason): pt was unable to get paper towel on L side and had to reach with R UE to get paper towel after 3 failed attempts. Toilet Transfer: Minimal assistance, Ambulation, Regular Toilet Toilet Transfer Details (indicate cue type and reason): pt static standing to void bladder. Pt noted to have blood in urine. pt reports urgency and inability to hold urine at this time. pt states "when i have to go i have to go now" Toileting- Water quality scientist and Hygiene: Minimal assistance (standing) Functional mobility during ADLs: Minimal assistance General ADL Comments: pt bumping into objects on L side and extending UE to  help feel for environmental objects. pt completed moca assessement with 23 out 30 score indicating deficits. See visual changes  Cognition: Cognition Overall Cognitive Status: Impaired/Different from baseline Orientation Level: Oriented X4 Cognition Arousal/Alertness: Awake/alert Behavior During Therapy: Impulsive Overall Cognitive Status: Impaired/Different from baseline Area of Impairment: Attention, Safety/judgement, Awareness Current Attention Level: Selective Safety/Judgement: Decreased awareness of deficits Awareness: Emergent General Comments: MOCA provided. Pt immediately states "i did this test 2 weeks ago. I know the answers" Pt completed with score 23 out 30. Pt does report anxiety.  Sister dropped daughter ( niece) oxygen tank prior to patient beginning assessment. Pt told family " you have to get that under control because you just really scared me." pt demonstrates visual deficits with head turns and verbalizations of eye changes.   Blood pressure 123/63, pulse 59, temperature 99 F (37.2 C), temperature source Oral, resp. rate 14, height 5\' 8"  (1.727 m), weight 111.698 kg (246 lb 4 oz), SpO2 95 %. Physical Exam  Vitals reviewed. Constitutional: He is oriented to person, place, and time. He appears well-developed and well-nourished.  HENT:  Head: Normocephalic and atraumatic.  Eyes: Conjunctivae and EOM are normal.  Neck: Normal range of motion. Neck supple. No thyromegaly present.  Cardiovascular: Normal rate and regular rhythm.   Respiratory: Effort normal and breath sounds normal. No respiratory distress.  GI: Soft. Bowel sounds are normal. He exhibits no distension.  Musculoskeletal: He exhibits no edema or tenderness.  Neurological: He is alert and oriented to person, place, and time.  Follows commands.  Fair awareness of deficits Mild left facial droop. Tongue is midline Sensation intact to light touch DTRs symmetric Motor: RUE/RLE: 5/5 proximal to distal LLE: 4+/5 proximal to distal LUE: 4/5 proximal to distal  Skin: Skin is warm and dry.  Psychiatric: He has a normal mood and affect. His behavior is normal.    Results for orders placed or performed during the hospital encounter of 09/20/15 (from the past 24 hour(s))  Heparin level (unfractionated)     Status: Abnormal   Collection Time: 09/21/15  3:09 PM  Result Value Ref Range   Heparin Unfractionated 0.12 (L) 0.30 - 0.70 IU/mL  Platelet inhibition p2y12 (Not at [facility])     Status: Abnormal   Collection Time: 09/21/15  3:09 PM  Result Value Ref Range   Platelet Function  P2Y12 150 (L) 194 - 418 PRU  Heparin level (unfractionated)     Status: Abnormal   Collection Time: 09/21/15  8:50 PM  Result  Value Ref Range   Heparin Unfractionated 0.11 (L) 0.30 - 0.70 IU/mL  CBC     Status: Abnormal   Collection Time: 09/22/15  3:54 AM  Result Value Ref Range   WBC 9.5 4.0 - 10.5 K/uL   RBC 4.31 4.22 - 5.81 MIL/uL   Hemoglobin 12.1 (L) 13.0 - 17.0 g/dL   HCT 37.5 (L) 39.0 - 52.0 %   MCV 87.0 78.0 - 100.0 fL   MCH 28.1 26.0 - 34.0 pg   MCHC 32.3 30.0 - 36.0 g/dL   RDW 13.6 11.5 - 15.5 %   Platelets 274 150 - 400 K/uL  Basic metabolic panel     Status: Abnormal   Collection Time: 09/22/15  3:54 AM  Result Value Ref Range   Sodium 140 135 - 145 mmol/L   Potassium 3.8 3.5 - 5.1 mmol/L   Chloride 105 101 - 111 mmol/L   CO2 27 22 - 32 mmol/L   Glucose, Bld 89 65 -  99 mg/dL   BUN 8 6 - 20 mg/dL   Creatinine, Ser 0.88 0.61 - 1.24 mg/dL   Calcium 8.3 (L) 8.9 - 10.3 mg/dL   GFR calc non Af Amer >60 >60 mL/min   GFR calc Af Amer >60 >60 mL/min   Anion gap 8 5 - 15  Lipid panel     Status: Abnormal   Collection Time: 09/22/15  3:54 AM  Result Value Ref Range   Cholesterol 158 0 - 200 mg/dL   Triglycerides 145 <150 mg/dL   HDL 34 (L) >40 mg/dL   Total CHOL/HDL Ratio 4.6 RATIO   VLDL 29 0 - 40 mg/dL   LDL Cholesterol 95 0 - 99 mg/dL   Ct Angio Head W/cm &/or Wo Cm  09/20/2015  CLINICAL DATA:  Initial evaluation for acute left-sided weakness status post pipeline device placement for pericallosal aneurysm. Symptoms now all resolved. EXAM: CT ANGIOGRAPHY HEAD AND NECK TECHNIQUE: Multidetector CT imaging of the head and neck was performed using the standard protocol during bolus administration of intravenous contrast. Multiplanar CT image reconstructions and MIPs were obtained to evaluate the vascular anatomy. Carotid stenosis measurements (when applicable) are obtained utilizing NASCET criteria, using the distal internal carotid diameter as the denominator. CONTRAST:  50 cc of Isovue 370. COMPARISON:  Prior studies from earlier same day. FINDINGS: CTA NECK Aortic arch: Visualized aortic arch of  normal caliber with normal branch pattern. No high-grade stenosis at the origin of the great vessels. Visualized subclavian arteries widely patent. Right carotid system: Right common carotid artery patent from its origin to the bifurcation. Mild noncalcified plaque about the right bifurcation without stenosis. Right ICA widely patent from the bifurcation to the skullbase. No stenosis, dissection, or vascular occlusion within the right carotid artery system. Left carotid system: Left common carotid artery patent from its origin to the bifurcation. Minimal calcified noncalcified plaque about the left bifurcation without stenosis. Left ICA patent from the bifurcation to the skullbase. No stenosis, dissection, or vascular occlusion within the left carotid artery system. Vertebral arteries:Both vertebral arteries arise from the subclavian arteries. Left vertebral artery is dominant. Vertebral arteries patent without stenosis, dissection, or occlusion. 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 No worrisome lytic or blastic osseous lesions. Other neck: Visualized lungs are clear. Visualized superior mediastinum within normal limits. Few scattered subcentimeter hypodense nodules noted within the thyroid gland, of doubtful clinical significance. No adenopathy within the neck. No acute soft tissue abnormality. CTA HEAD Anterior circulation: Petrous segments widely patent bilaterally. Minimal atheromatous plaque within the cavernous ICAs without stenosis. Supraclinoid segments widely patent. A1 segments well opacified. Dominant right ACA which is widely patent. 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 Persistent filling of the aneurysm which measures 8 x 7 x 9 mm. Flow seen distally the pipeline device within the distal pericallosal branches. M1 segments patent without stenosis  or occlusion. MCA bifurcations normal. No proximal M2 branch occlusion or stenosis. Distal MCA branches well opacified. Posterior circulation: Vertebral arteries patent to the vertebrobasilar junction. Left vertebral artery dominant. Posterior inferior cerebral arteries patent. Basilar artery widely patent. Superior cerebellar arteries and posterior cerebral arteries well opacified. Prominent right posterior communicating artery with hypoplastic right P1 segment. Venous sinuses: No evidence for venous sinus thrombosis. Anatomic variants: No significant anatomic variant. Delayed phase: Not performed. IMPRESSION: 1. No large or proximal arterial branch occlusion. No complication status post recent catheter  directed arteriogram. 2. 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 3. Mild intracranial and extracranial atheromatous disease as above. Electronically Signed   By: Jeannine Boga M.D.   On: 09/20/2015 22:49   Ct Head Wo Contrast  09/20/2015  ADDENDUM REPORT: 09/20/2015 21:54 ADDENDUM: These results were called by telephone at the time of interpretation on 09/20/2015 at 9:49 pm to Dr. Wallie Char, who verbally acknowledged these results. Electronically Signed   By: Logan Bores M.D.   On: 09/20/2015 21:54  09/20/2015  CLINICAL DATA:  Code stroke. Left-sided facial droop. Status post endovascular treatment of pericallosal aneurysm earlier today. EXAM: CT HEAD WITHOUT CONTRAST TECHNIQUE: Contiguous axial images were obtained from the base of the skull through the vertex without intravenous contrast. COMPARISON:  Head MRI 09/15/2015 and CT 09/14/2015 FINDINGS: There has been interval placement of a pipeline stent for treatment of the previously described pericallosal aneurysm. No acute cortical infarct, intracranial hemorrhage, mass, midline shift, or extra-axial fluid collection is identified. Scattered, small foci of hypoattenuation in the cerebral white matter bilaterally are  similar to the prior CT and nonspecific but compatible with mild chronic small vessel ischemic disease. Ventricles and sulci are normal in size. Orbits are unremarkable. Mild paranasal sinus mucosal thickening and prior left mastoidectomy are noted. IMPRESSION: 1. No evidence of acute intracranial abnormality following recent endovascular pericallosal aneurysm treatment. 2. Mild chronic small vessel ischemic disease. Electronically Signed: By: Logan Bores M.D. On: 09/20/2015 21:47   Ct Angio Neck W/cm &/or Wo/cm  09/20/2015  CLINICAL DATA:  Initial evaluation for acute left-sided weakness status post pipeline device placement for pericallosal aneurysm. Symptoms now all resolved. EXAM: CT ANGIOGRAPHY HEAD AND NECK TECHNIQUE: Multidetector CT imaging of the head and neck was performed using the standard protocol during bolus administration of intravenous contrast. Multiplanar CT image reconstructions and MIPs were obtained to evaluate the vascular anatomy. Carotid stenosis measurements (when applicable) are obtained utilizing NASCET criteria, using the distal internal carotid diameter as the denominator. CONTRAST:  50 cc of Isovue 370. COMPARISON:  Prior studies from earlier same day. FINDINGS: CTA NECK Aortic arch: Visualized aortic arch of normal caliber with normal branch pattern. No high-grade stenosis at the origin of the great vessels. Visualized subclavian arteries widely patent. Right carotid system: Right common carotid artery patent from its origin to the bifurcation. Mild noncalcified plaque about the right bifurcation without stenosis. Right ICA widely patent from the bifurcation to the skullbase. No stenosis, dissection, or vascular occlusion within the right carotid artery system. Left carotid system: Left common carotid artery patent from its origin to the bifurcation. Minimal calcified noncalcified plaque about the left bifurcation without stenosis. Left ICA patent from the bifurcation to the  skullbase. No stenosis, dissection, or vascular occlusion within the left carotid artery system. Vertebral arteries:Both vertebral arteries arise from the subclavian arteries. Left vertebral artery is dominant. Vertebral arteries patent without stenosis, dissection, or occlusion. 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 No worrisome lytic or blastic osseous lesions. Other neck: Visualized lungs are clear. Visualized superior mediastinum within normal limits. Few scattered subcentimeter hypodense nodules noted within the thyroid gland, of doubtful clinical significance. No adenopathy within the neck. No acute soft tissue abnormality. CTA HEAD Anterior circulation: Petrous segments widely patent bilaterally. Minimal atheromatous plaque within the cavernous ICAs without stenosis. Supraclinoid segments widely patent. A1 segments well opacified. Dominant right ACA which is widely patent. There has been placement of a pipeline stent  device across the known bilobed pericallosal artery aneurysm. Persistent filling of the aneurysm which measures 8 x 7 x 9 mm. Flow seen distally the pipeline device within the distal pericallosal branches. M1 segments patent without stenosis or occlusion. MCA bifurcations normal. No proximal M2 branch occlusion or stenosis. Distal MCA branches well opacified. Posterior circulation: Vertebral arteries patent to the vertebrobasilar junction. Left vertebral artery dominant. Posterior inferior cerebral arteries patent. Basilar artery widely patent. Superior cerebellar arteries and posterior cerebral arteries well opacified. Prominent right posterior communicating artery with hypoplastic right P1 segment. Venous sinuses: No evidence for venous sinus thrombosis. Anatomic variants: No significant anatomic variant. Delayed phase: Not performed. IMPRESSION: 1. No large or proximal arterial branch occlusion. No  complication status post recent catheter directed arteriogram. 2. 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 3. Mild intracranial and extracranial atheromatous disease as above. Electronically Signed   By: Jeannine Boga M.D.   On: 09/20/2015 22:49   Mr Brain Wo Contrast  09/21/2015  CLINICAL DATA:  Pipeline stent for pericallosal aneurysm diversion. Facial numbness. EXAM: MRI HEAD WITHOUT CONTRAST TECHNIQUE: Multiplanar, multiecho pulse sequences of the brain and surrounding structures were obtained without intravenous contrast. COMPARISON:  MRI 09/15/2015 FINDINGS: Small, 3 mm areas of restricted diffusion in the high right frontal cortex, high right parietal cortex, and posterior right parietal cortex. These are most compatible with small embolic acute infarcts. No other acute infarct Artifact from pipeline stent in the anterior cerebral artery on the left. Ventricle size is normal.  Cerebral volume is normal Scattered small hyperintense lesions in the cerebral white matter bilaterally and in the pons most compatible with chronic microvascular ischemia. Mild chronic ischemia in the thalamus bilaterally. No MR evidence of acute subarachnoid hemorrhage. Previous MRI demonstrated susceptibility in the left caudate head which is no longer visualized likely due to prior study being done on 3 tesla equipment in the current study 1.5 tesla equipment. Possible cavernoma. Mucosal edema in the paranasal sinuses bilaterally. 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 Pipeline stent left anterior cerebral artery. No evidence of acute intracranial hemorrhage. Electronically Signed   By: Franchot Gallo M.D.   On: 09/21/2015 14:21    Assessment/Plan: Diagnosis: Right MCA aneurysm status post stent Labs and images independently reviewed.  Records reviewed and summated above. Stroke: Continue secondary stroke prophylaxis and  Risk Factor Modification listed below:   Antiplatelet therapy:   Blood Pressure Management:  Continue current medication with prn's with permisive HTN per primary team Statin Agent:   Pre-diabetes management:   Left sided hemiparesis: fit for orthotics to prevent contractures (resting hand splint for day, wrist cock up splint at night, PRAFO, etc); PT/OT for mobility, ADL training  Motor recovery: Fluoxetine  1. Does the need for close, 24 hr/day medical supervision in concert with the patient's rehab needs make it unreasonable for this patient to be served in a less intensive setting? Yes  2. Co-Morbidities requiring supervision/potential complications: HTN (monitor and provide prns in accordance with increased physical exertion and pain), migraine headaches,(ensure pain does not limit functional progress in therapies), tachypnea (monitor RR and O2 Sats with increased physical exertion), ABLA (transfuse if necessary to ensure appropriate perfusion for increased activity tolerance), prediabetes (Monitor in accordance with exercise and adjust meds as necessary) 3. Due to safety, disease management, pain management and patient education, does the patient require 24 hr/day rehab nursing? Yes 4. Does the patient require coordinated care of  a physician, rehab nurse, PT (1-2 hrs/day, 5 days/week) and OT (1-2 hrs/day, 5 days/week) to address physical and functional deficits in the context of the above medical diagnosis(es)? Yes Addressing deficits in the following areas: balance, endurance, locomotion, strength, transferring, toileting and psychosocial support 5. Can the patient actively participate in an intensive therapy program of at least 3 hrs of therapy per day at least 5 days per week? Yes 6. The potential for patient to make measurable gains while on inpatient rehab is good 7. Anticipated functional outcomes upon discharge from inpatient rehab are independent and modified independent  with PT,  independent and modified independent with OT, n/a with SLP. 8. Does the patient have adequate social supports and living environment to accommodate these discharge functional goals? Yes 9. Anticipated D/C setting: Home 10. Anticipated post D/C treatments: HH therapy and Home excercise program 11. Overall Rehab/Functional Prognosis: good  RECOMMENDATIONS: This patient's condition is appropriate for continued rehabilitative care in the following setting: CIR after completion of medical workup (Echo)  Patient has agreed to participate in recommended program. Yes Note that insurance prior authorization may be required for reimbursement for recommended care.  Comment: Rehab Admissions Coordinator to follow up.  Delice Lesch, MD 09/22/2015

## 2015-09-22 NOTE — Progress Notes (Signed)
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  Medications: Prior to Admission medications   Medication Sig Start Date End Date Taking? Authorizing Provider  amLODipine (NORVASC) 10 MG tablet Take 1 tablet (10 mg total) by mouth daily. 10/08/14  Yes Davonna Belling, MD  aspirin EC 325 MG EC tablet Take 1 tablet (325 mg total) by mouth daily. 09/17/15  Yes Shanker Kristeen Mans, MD  atorvastatin (LIPITOR) 10 MG tablet Take 10 mg by mouth daily at 6 PM.  12/01/14 12/01/15 Yes Historical Provider, MD  baclofen (LIORESAL) 10 MG tablet Take 1 tablet (10 mg total) by mouth at bedtime. 09/17/15  Yes Shanker Kristeen Mans, MD  chlorthalidone (HYGROTON) 25 MG tablet Take 25 mg by mouth daily. 07/29/15 10/27/15 Yes Historical Provider, MD  clopidogrel (PLAVIX) 75 MG tablet Take 1 tablet (75 mg total) by mouth daily. 09/17/15  Yes Shanker Kristeen Mans, MD  divalproex (DEPAKOTE) 500 MG DR tablet Take 1 tablet (500 mg total) by mouth every 12 (twelve) hours. 09/17/15  Yes Shanker Kristeen Mans, MD  escitalopram (LEXAPRO) 10 MG tablet Take 10 mg by mouth daily.  11/29/14  Yes Historical Provider, MD  lisinopril (PRINIVIL,ZESTRIL) 20 MG tablet Take 2 tablets (40 mg total) by mouth every morning. 10/08/14  Yes Davonna Belling, MD  metoprolol (LOPRESSOR) 50 MG tablet Take 2 tablets (100 mg total) by mouth 2 (two) times daily. 09/17/15  Yes Shanker Kristeen Mans, MD  fluticasone (FLONASE) 50 MCG/ACT nasal spray Place 2 sprays into the nose daily. Patient taking differently: Place 2 sprays into the nose daily as needed for allergies.  09/07/12    Cleatrice Burke, PA-C     Vital Signs: BP 123/63 mmHg  Pulse 70  Temp(Src) 98.8 F (37.1 C) (Oral)  Resp 20  Ht 5\' 8"  (1.727 m)  Wt 246 lb 4 oz (111.698 kg)  BMI 37.45 kg/m2  SpO2 96%  Physical Exam  Constitutional: He is oriented to person, place, and time.  HENT:  Left facial droop Tongue midline   Musculoskeletal:  Left arm/hand weakness Good strength B legs  Neurological: He is alert and oriented to person, place, and time.  Skin: Skin is warm and dry.  Psychiatric: He has a normal mood and affect. His behavior is normal. Judgment and thought content normal.  Nursing note and vitals reviewed.   Imaging: Ct Angio Head W/cm &/or Wo Cm  09/20/2015  CLINICAL DATA:  Initial evaluation for acute left-sided weakness status post pipeline device placement for pericallosal aneurysm. Symptoms now all resolved. EXAM: CT ANGIOGRAPHY HEAD AND NECK TECHNIQUE: Multidetector CT imaging of the head and neck was performed using the standard protocol during bolus administration of intravenous contrast. Multiplanar CT image reconstructions and MIPs were obtained to evaluate the vascular anatomy. Carotid stenosis measurements (when applicable) are obtained utilizing NASCET criteria, using the distal internal carotid diameter as the denominator. CONTRAST:  50 cc of Isovue 370. COMPARISON:  Prior studies from earlier same day. FINDINGS: CTA NECK Aortic arch: Visualized aortic arch of normal caliber with normal branch pattern. No high-grade stenosis at the origin of the great vessels. Visualized subclavian arteries widely patent. Right  carotid system: Right common carotid artery patent from its origin to the bifurcation. Mild noncalcified plaque about the right bifurcation without stenosis. Right ICA widely patent from the bifurcation to the skullbase. No stenosis, dissection, or vascular occlusion within the right carotid artery system. Left carotid system: Left common carotid artery patent from its  origin to the bifurcation. Minimal calcified noncalcified plaque about the left bifurcation without stenosis. Left ICA patent from the bifurcation to the skullbase. No stenosis, dissection, or vascular occlusion within the left carotid artery system. Vertebral arteries:Both vertebral arteries arise from the subclavian arteries. Left vertebral artery is dominant. Vertebral arteries patent without stenosis, dissection, or occlusion. 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 No worrisome lytic or blastic osseous lesions. Other neck: Visualized lungs are clear. Visualized superior mediastinum within normal limits. Few scattered subcentimeter hypodense nodules noted within the thyroid gland, of doubtful clinical significance. No adenopathy within the neck. No acute soft tissue abnormality. CTA HEAD Anterior circulation: Petrous segments widely patent bilaterally. Minimal atheromatous plaque within the cavernous ICAs without stenosis. Supraclinoid segments widely patent. A1 segments well opacified. Dominant right ACA which is widely patent. 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 Persistent filling of the aneurysm which measures 8 x 7 x 9 mm. Flow seen distally the pipeline device within the distal pericallosal branches. M1 segments patent without stenosis or occlusion. MCA bifurcations normal. No proximal M2 branch occlusion or stenosis. Distal MCA branches well opacified. Posterior circulation: Vertebral arteries patent to the vertebrobasilar junction. Left vertebral artery dominant. Posterior inferior cerebral arteries patent. Basilar artery widely patent. Superior cerebellar arteries and posterior cerebral arteries well opacified. Prominent right posterior communicating artery with hypoplastic right P1 segment. Venous sinuses: No evidence for venous sinus thrombosis.  Anatomic variants: No significant anatomic variant. Delayed phase: Not performed. IMPRESSION: 1. No large or proximal arterial branch occlusion. No complication status post recent catheter directed arteriogram. 2. 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 3. Mild intracranial and extracranial atheromatous disease as above. Electronically Signed   By: Jeannine Boga M.D.   On: 09/20/2015 22:49   Ct Head Wo Contrast  09/20/2015  ADDENDUM REPORT: 09/20/2015 21:54 ADDENDUM: These results were called by telephone at the time of interpretation on 09/20/2015 at 9:49 pm to Dr. Wallie Char, who verbally acknowledged these results. Electronically Signed   By: Logan Bores M.D.   On: 09/20/2015 21:54  09/20/2015  CLINICAL DATA:  Code stroke. Left-sided facial droop. Status post endovascular treatment of pericallosal aneurysm earlier today. EXAM: CT HEAD WITHOUT CONTRAST TECHNIQUE: Contiguous axial images were obtained from the base of the skull through the vertex without intravenous contrast. COMPARISON:  Head MRI 09/15/2015 and CT 09/14/2015 FINDINGS: There has been interval placement of a pipeline stent for treatment of the previously described pericallosal aneurysm. No acute cortical infarct, intracranial hemorrhage, mass, midline shift, or extra-axial fluid collection is identified. Scattered, small foci of hypoattenuation in the cerebral white matter bilaterally are similar to the prior CT and nonspecific but compatible with mild chronic small vessel ischemic disease. Ventricles and sulci are normal in size. Orbits are unremarkable. Mild paranasal sinus mucosal thickening and prior left mastoidectomy are noted. IMPRESSION: 1. No evidence of acute intracranial abnormality following recent endovascular pericallosal aneurysm treatment. 2. Mild chronic small vessel ischemic disease. Electronically Signed: By: Logan Bores M.D. On: 09/20/2015 21:47   Ct Angio Neck W/cm &/or  Wo/cm  09/20/2015  CLINICAL DATA:  Initial evaluation for acute left-sided weakness status post pipeline device placement for pericallosal aneurysm. Symptoms now all resolved. EXAM: CT ANGIOGRAPHY HEAD AND NECK TECHNIQUE: Multidetector CT imaging of the head and neck was performed using the standard protocol during bolus administration of intravenous contrast. Multiplanar CT image reconstructions and MIPs were obtained to evaluate the vascular anatomy. Carotid stenosis measurements (when applicable) are obtained utilizing NASCET criteria, using the distal internal carotid diameter as the denominator. CONTRAST:  50 cc of Isovue 370. COMPARISON:  Prior studies from earlier same day. FINDINGS: CTA NECK Aortic arch: Visualized aortic arch of normal caliber with normal branch pattern. No high-grade stenosis at the origin of the great vessels. Visualized subclavian arteries widely patent. Right carotid system: Right common carotid artery patent from its origin to the bifurcation. Mild noncalcified plaque about the right bifurcation without stenosis. Right ICA widely patent from the bifurcation to the skullbase. No stenosis, dissection, or vascular occlusion within the right carotid artery system. Left carotid system: Left common carotid artery patent from its origin to the bifurcation. Minimal calcified noncalcified plaque about the left bifurcation without stenosis. Left ICA patent from the bifurcation to the skullbase. No stenosis, dissection, or vascular occlusion within the left carotid artery system. Vertebral arteries:Both vertebral arteries arise from the subclavian arteries. Left vertebral artery is dominant. Vertebral arteries patent without stenosis, dissection, or occlusion. 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 No worrisome lytic or blastic osseous lesions. Other neck: Visualized lungs are clear. Visualized superior  mediastinum within normal limits. Few scattered subcentimeter hypodense nodules noted within the thyroid gland, of doubtful clinical significance. No adenopathy within the neck. No acute soft tissue abnormality. CTA HEAD Anterior circulation: Petrous segments widely patent bilaterally. Minimal atheromatous plaque within the cavernous ICAs without stenosis. Supraclinoid segments widely patent. A1 segments well opacified. Dominant right ACA which is widely patent. 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 Persistent filling of the aneurysm which measures 8 x 7 x 9 mm. Flow seen distally the pipeline device within the distal pericallosal branches. M1 segments patent without stenosis or occlusion. MCA bifurcations normal. No proximal M2 branch occlusion or stenosis. Distal MCA branches well opacified. Posterior circulation: Vertebral arteries patent to the vertebrobasilar junction. Left vertebral artery dominant. Posterior inferior cerebral arteries patent. Basilar artery widely patent. Superior cerebellar arteries and posterior cerebral arteries well opacified. Prominent right posterior communicating artery with hypoplastic right P1 segment. Venous sinuses: No evidence for venous sinus thrombosis. Anatomic variants: No significant anatomic variant. Delayed phase: Not performed. IMPRESSION: 1. No large or proximal arterial branch occlusion. No complication status post recent catheter directed arteriogram. 2. 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 3. Mild intracranial and extracranial atheromatous disease as above. Electronically Signed   By: Jeannine Boga M.D.   On: 09/20/2015 22:49   Mr Brain Wo Contrast  09/21/2015  CLINICAL DATA:  Pipeline stent for pericallosal aneurysm diversion. Facial numbness. EXAM: MRI HEAD WITHOUT CONTRAST TECHNIQUE: Multiplanar, multiecho pulse sequences of the brain and surrounding  structures were obtained without intravenous contrast. COMPARISON:  MRI 09/15/2015 FINDINGS: Small, 3 mm areas of restricted diffusion in the high right frontal cortex, high right parietal cortex, and posterior right parietal cortex. These are most compatible with small embolic acute infarcts. No other acute infarct Artifact from pipeline stent in the anterior cerebral artery on the left. Ventricle size is normal.  Cerebral volume is normal Scattered small hyperintense lesions in the cerebral white matter bilaterally and in the pons most compatible with chronic microvascular ischemia. Mild chronic ischemia in the thalamus bilaterally. No MR evidence of acute subarachnoid hemorrhage. Previous MRI demonstrated susceptibility in the left caudate head which is no longer visualized likely due to prior study being done on 3 tesla equipment in the current study 1.5 tesla equipment. Possible cavernoma. Mucosal edema in the paranasal sinuses bilaterally. 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 Pipeline stent left anterior cerebral artery. No evidence of acute intracranial hemorrhage. Electronically Signed   By: Franchot Gallo M.D.   On: 09/21/2015 14:21    Labs:  CBC:  Recent Labs  09/14/15 2049 09/14/15 2056 09/20/15 0905 09/21/15 0516 09/22/15 0354  WBC 12.1*  --  11.8* 11.5* 9.5  HGB 15.1 16.3 13.7 12.1* 12.1*  HCT 43.4 48.0 41.5 36.5* 37.5*  PLT 377  --  306 235 274    COAGS:  Recent Labs  09/14/15 2049 09/20/15 0905  INR 1.10 0.99  APTT 25 26    BMP:  Recent Labs  09/14/15 2049 09/14/15 2056 09/20/15 0905 09/21/15 0516 09/22/15 0354  NA 136 139 140 139 140  K 3.5 3.5 4.0 3.7 3.8  CL 100* 101 104 106 105  CO2 24  --  24 23 27   GLUCOSE 222* 215* 104* 93 89  BUN 26* 25* 16 8 8   CALCIUM 8.9  --  9.1 7.9* 8.3*  CREATININE 1.17 1.10 0.94 0.80 0.88  GFRNONAA >60  --  >60 >60 >60  GFRAA >60  --  >60 >60 >60     LIVER FUNCTION TESTS:  Recent Labs  09/14/15 2049  BILITOT 0.7  AST 21  ALT 26  ALKPHOS 74  PROT 7.6  ALBUMIN 3.9    Assessment and Plan:  Pericallosal aneurysm stent 4/24 in IR with Dr Estanislado Pandy New CVA on MRI  Dr Erlinda Hong has evaluated pt Has ordered EEG and PT/OT eval Plan for poss dc in am  Electronically Signed: Romaine Maciolek A 09/22/2015, 8:20 AM   I spent a total of 15 Minutes at the the patient's bedside AND on the patient's hospital floor or unit, greater than 50% of which was counseling/coordinating care for CVA post cerebral aneurysm embolization

## 2015-09-23 ENCOUNTER — Inpatient Hospital Stay (HOSPITAL_COMMUNITY)
Admission: RE | Admit: 2015-09-23 | Discharge: 2015-09-30 | DRG: 066 | Disposition: A | Discharge status: Home Health | Payer: Managed Care, Other (non HMO) | Source: Intra-hospital | Attending: Physical Medicine & Rehabilitation | Admitting: Physical Medicine & Rehabilitation

## 2015-09-23 ENCOUNTER — Inpatient Hospital Stay (HOSPITAL_COMMUNITY): Payer: Managed Care, Other (non HMO)

## 2015-09-23 DIAGNOSIS — G43909 Migraine, unspecified, not intractable, without status migrainosus: Secondary | ICD-10-CM | POA: Diagnosis present

## 2015-09-23 DIAGNOSIS — F419 Anxiety disorder, unspecified: Secondary | ICD-10-CM | POA: Diagnosis present

## 2015-09-23 DIAGNOSIS — Z7902 Long term (current) use of antithrombotics/antiplatelets: Secondary | ICD-10-CM

## 2015-09-23 DIAGNOSIS — F329 Major depressive disorder, single episode, unspecified: Secondary | ICD-10-CM | POA: Diagnosis present

## 2015-09-23 DIAGNOSIS — I63411 Cerebral infarction due to embolism of right middle cerebral artery: Secondary | ICD-10-CM

## 2015-09-23 DIAGNOSIS — R269 Unspecified abnormalities of gait and mobility: Secondary | ICD-10-CM | POA: Diagnosis present

## 2015-09-23 DIAGNOSIS — I671 Cerebral aneurysm, nonruptured: Secondary | ICD-10-CM | POA: Diagnosis present

## 2015-09-23 DIAGNOSIS — I69334 Monoplegia of upper limb following cerebral infarction affecting left non-dominant side: Secondary | ICD-10-CM | POA: Diagnosis not present

## 2015-09-23 DIAGNOSIS — I69398 Other sequelae of cerebral infarction: Secondary | ICD-10-CM | POA: Diagnosis not present

## 2015-09-23 DIAGNOSIS — Z7982 Long term (current) use of aspirin: Secondary | ICD-10-CM

## 2015-09-23 DIAGNOSIS — I69392 Facial weakness following cerebral infarction: Secondary | ICD-10-CM

## 2015-09-23 DIAGNOSIS — E785 Hyperlipidemia, unspecified: Secondary | ICD-10-CM | POA: Diagnosis present

## 2015-09-23 DIAGNOSIS — Z79899 Other long term (current) drug therapy: Secondary | ICD-10-CM

## 2015-09-23 DIAGNOSIS — R253 Fasciculation: Secondary | ICD-10-CM | POA: Diagnosis present

## 2015-09-23 DIAGNOSIS — R7303 Prediabetes: Secondary | ICD-10-CM | POA: Diagnosis present

## 2015-09-23 DIAGNOSIS — F411 Generalized anxiety disorder: Secondary | ICD-10-CM | POA: Diagnosis not present

## 2015-09-23 DIAGNOSIS — F4323 Adjustment disorder with mixed anxiety and depressed mood: Secondary | ICD-10-CM | POA: Diagnosis present

## 2015-09-23 DIAGNOSIS — I6789 Other cerebrovascular disease: Secondary | ICD-10-CM

## 2015-09-23 DIAGNOSIS — G43109 Migraine with aura, not intractable, without status migrainosus: Secondary | ICD-10-CM | POA: Insufficient documentation

## 2015-09-23 DIAGNOSIS — I10 Essential (primary) hypertension: Secondary | ICD-10-CM | POA: Diagnosis present

## 2015-09-23 DIAGNOSIS — G4700 Insomnia, unspecified: Secondary | ICD-10-CM | POA: Diagnosis present

## 2015-09-23 DIAGNOSIS — I6340 Cerebral infarction due to embolism of unspecified cerebral artery: Secondary | ICD-10-CM | POA: Insufficient documentation

## 2015-09-23 LAB — HEMOGLOBIN A1C
HEMOGLOBIN A1C: 6.1 % — AB (ref 4.8–5.6)
Mean Plasma Glucose: 128 mg/dL

## 2015-09-23 LAB — ECHOCARDIOGRAM COMPLETE
Height: 68 in
WEIGHTICAEL: 3940 [oz_av]

## 2015-09-23 MED ORDER — BUTALBITAL-APAP-CAFFEINE 50-325-40 MG PO TABS
1.0000 | ORAL_TABLET | Freq: Three times a day (TID) | ORAL | Status: DC | PRN
  Administered 2015-09-23 – 2015-09-24 (×2): 1 via ORAL
  Filled 2015-09-23 (×2): qty 1

## 2015-09-23 MED ORDER — DIVALPROEX SODIUM 250 MG PO DR TAB: 750.0000 mg | DELAYED_RELEASE_TABLET | Freq: Three times a day (TID) | ORAL | Status: DC

## 2015-09-23 MED ORDER — ONDANSETRON HCL 4 MG PO TABS
4.0000 mg | ORAL_TABLET | Freq: Four times a day (QID) | ORAL | Status: DC | PRN
  Filled 2015-09-23: qty 1

## 2015-09-23 MED ORDER — ASPIRIN EC 325 MG PO TBEC
325.0000 mg | DELAYED_RELEASE_TABLET | Freq: Every day | ORAL | Status: DC
  Administered 2015-09-24 – 2015-09-30 (×7): 325 mg via ORAL
  Filled 2015-09-23 (×7): qty 1

## 2015-09-23 MED ORDER — LISINOPRIL 40 MG PO TABS
40.0000 mg | ORAL_TABLET | Freq: Every morning | ORAL | Status: DC
Start: 2015-09-24 — End: 2015-09-30
  Administered 2015-09-24 – 2015-09-30 (×7): 40 mg via ORAL
  Filled 2015-09-23 (×7): qty 1

## 2015-09-23 MED ORDER — PERFLUTREN LIPID MICROSPHERE
1.0000 mL | INTRAVENOUS | Status: AC | PRN
  Administered 2015-09-23: 2 mL via INTRAVENOUS
  Filled 2015-09-23: qty 10

## 2015-09-23 MED ORDER — FLUTICASONE PROPIONATE 50 MCG/ACT NA SUSP
2.0000 | Freq: Every day | NASAL | Status: DC | PRN
  Filled 2015-09-23: qty 16

## 2015-09-23 MED ORDER — ATORVASTATIN CALCIUM 10 MG PO TABS
10.0000 mg | ORAL_TABLET | Freq: Every day | ORAL | Status: DC
  Administered 2015-09-24 – 2015-09-29 (×6): 10 mg via ORAL
  Filled 2015-09-23 (×7): qty 1

## 2015-09-23 MED ORDER — AMLODIPINE BESYLATE 10 MG PO TABS
10.0000 mg | ORAL_TABLET | Freq: Every day | ORAL | Status: DC
  Administered 2015-09-24 – 2015-09-30 (×7): 10 mg via ORAL
  Filled 2015-09-23 (×7): qty 1

## 2015-09-23 MED ORDER — DIVALPROEX SODIUM 500 MG PO DR TAB
750.0000 mg | DELAYED_RELEASE_TABLET | Freq: Three times a day (TID) | ORAL | Status: DC
  Administered 2015-09-23 – 2015-09-30 (×20): 750 mg via ORAL
  Filled 2015-09-23: qty 1
  Filled 2015-09-23 (×5): qty 3
  Filled 2015-09-23: qty 1
  Filled 2015-09-23 (×2): qty 3
  Filled 2015-09-23 (×2): qty 1
  Filled 2015-09-23 (×4): qty 3
  Filled 2015-09-23 (×4): qty 1
  Filled 2015-09-23: qty 3
  Filled 2015-09-23 (×2): qty 1

## 2015-09-23 MED ORDER — CLOPIDOGREL BISULFATE 75 MG PO TABS
75.0000 mg | ORAL_TABLET | Freq: Every day | ORAL | Status: DC
  Administered 2015-09-24 – 2015-09-30 (×7): 75 mg via ORAL
  Filled 2015-09-23 (×7): qty 1

## 2015-09-23 MED ORDER — ESCITALOPRAM OXALATE 10 MG PO TABS
10.0000 mg | ORAL_TABLET | Freq: Every day | ORAL | Status: DC
  Administered 2015-09-24 – 2015-09-30 (×7): 10 mg via ORAL
  Filled 2015-09-23 (×7): qty 1

## 2015-09-23 MED ORDER — SORBITOL 70 % SOLN
30.0000 mL | Freq: Every day | Status: DC | PRN
  Administered 2015-09-24 – 2015-09-25 (×2): 30 mL via ORAL
  Filled 2015-09-23 (×2): qty 30

## 2015-09-23 MED ORDER — METOPROLOL TARTRATE 50 MG PO TABS
100.0000 mg | ORAL_TABLET | Freq: Two times a day (BID) | ORAL | Status: DC
  Administered 2015-09-24 – 2015-09-26 (×6): 100 mg via ORAL
  Filled 2015-09-23 (×7): qty 2

## 2015-09-23 MED ORDER — CHLORTHALIDONE 25 MG PO TABS
25.0000 mg | ORAL_TABLET | Freq: Every day | ORAL | Status: DC
  Administered 2015-09-24 – 2015-09-30 (×7): 25 mg via ORAL
  Filled 2015-09-23 (×8): qty 1

## 2015-09-23 MED ORDER — ONDANSETRON HCL 4 MG/2ML IJ SOLN: 4.0000 mg | Freq: Four times a day (QID) | INTRAMUSCULAR | Status: DC | PRN

## 2015-09-23 MED ORDER — DIVALPROEX SODIUM 500 MG PO DR TAB: 750.0000 mg | DELAYED_RELEASE_TABLET | Freq: Three times a day (TID) | ORAL | Status: DC

## 2015-09-23 MED ORDER — ACETAMINOPHEN 325 MG PO TABS
650.0000 mg | ORAL_TABLET | Freq: Four times a day (QID) | ORAL | Status: DC | PRN
  Administered 2015-09-27 – 2015-09-28 (×2): 650 mg via ORAL
  Filled 2015-09-23 (×3): qty 2

## 2015-09-23 MED ORDER — BACLOFEN 10 MG PO TABS
10.0000 mg | ORAL_TABLET | Freq: Every day | ORAL | Status: DC
  Administered 2015-09-23 – 2015-09-27 (×5): 10 mg via ORAL
  Filled 2015-09-23 (×5): qty 1

## 2015-09-23 NOTE — Interval H&P Note (Signed)
[name] was admitted today to Inpatient Rehabilitation with the diagnosis of Right MCA infarct.  The patient's history has been reviewed, patient examined, and there is no change in status.  Patient continues to be appropriate for intensive inpatient rehabilitation.  I have reviewed the patient's chart and labs.  Questions were answered to the patient's satisfaction. The PAPE has been reviewed and assessment remains appropriate.  [name] 09/23/2015, 3:38 PM

## 2015-09-23 NOTE — Clinical Social Work Placement (Cosign Needed)
   CLINICAL SOCIAL WORK PLACEMENT  NOTE  Date:  09/23/2015  Patient Details  Name: [name] MRN: [id number] Date of Birth: 02-11-1956  Clinical Social Work is seeking post-discharge placement for this patient at the [facility] level of care (*CSW will initial, date and re-position this form in  chart as items are completed):  Yes   Patient/family provided with [facility] Work Department's list of facilities offering this level of care within the geographic area requested by the patient (or if unable, by the patient's family).  Yes   Patient/family informed of their freedom to choose among providers that offer the needed level of care, that participate in Medicare, Medicaid or managed care program needed by the patient, have an available bed and are willing to accept the patient.  Yes   Patient/family informed of [facility]'s ownership interest in [facility] and [facility], as well as of the fact that they are under no obligation to receive care at these facilities.  PASRR submitted to EDS on       PASRR number received on       Existing PASRR number confirmed on       FL2 transmitted to all facilities in geographic area requested by pt/family on 09/23/15     FL2 transmitted to all facilities within larger geographic area on       Patient informed that his/her managed care company has contracts with or will negotiate with certain facilities, including the following:            Patient/family informed of bed offers received.  Patient chooses bed at       Physician recommends and patient chooses bed at      Patient to be transferred to   on  .  Patient to be transferred to facility by       Patient family notified on   of transfer.  Name of family member notified:        PHYSICIAN Please prepare priority discharge summary, including medications, Please sign FL2     Additional Comment:     _______________________________________________ Leane Call, Student-SW 09/23/2015, 9:24 AM

## 2015-09-23 NOTE — Discharge Summary (Signed)
Patient ID: GORGE KEHR MRN: [id number] DOB/AGE: 1956-04-19 60 y.o.  Admit date: 09/20/2015 Discharge date: 09/23/2015  Supervising Physician: Luanne Bras  Admission Diagnoses: pericallosal aneurysm   Discharge Diagnoses:  Active Problems:   Brain aneurysm   Drooping of mouth   Stroke (HCC)   Benign essential HTN   Migraine with aura and without status migrainosus, not intractable   Tachypnea   Prediabetes   Acute blood loss anemia   Discharged Condition: stable  Hospital Course: pt was admitted post pericalossal aneurysm pipeline flow diverter stent placement in IR with Dr Estanislado Pandy 09/20/2015 That evening developed symptoms of facial droop; Left arm weakness; numbness. Symptoms resolved quickly  Immediate CT scan revealed no hemorrhage; CTA showed no occlusions or filling defects intra or extracranially. 09/21/15 Dr Erlinda Hong was consulted to evaluate pt. MR Brain: IMPRESSION: Three small areas of acute infarct in the right MCA territory likely areas of embolic infarction.  Pipeline stent left anterior cerebral artery. No evidence of acute intracranial hemorrhage.   09/22/15:  EEG wnl; swallow study had no recommendations PT/OT did find pt to need continued rehab services. Echo wnl  4/27:  Pt better this am; eating well; slept well No facial droop; moving LUE better; still weaker than Rt IP rehab has secured a bed in Rehab unit; Insurance has been approved Pt will now discharge from Neuro ICU and trasnfer to IP Rehab He has agreed to this plan Dr Estanislado Pandy aware  Consults: Stroke team/Dr Erlinda Hong; In patient rehab  Significant Diagnostic Studies: cerebral arteriogram  Treatments:  Procedures    CEREBRAL ANGIOGRAM [id number] (Custom)]      Expand All Collapse All   S/P RT CCA angiogram followed by endovascular treatment of bilobed pericallosal aneurysm using a single 2.5 mm x 16 mm pipeline flow diverter device.       Discharge Exam: Blood pressure 125/66,  pulse 67, temperature 98.4 F (36.9 C), temperature source Oral, resp. rate 21, height 5\' 8"  (1.727 m), weight 246 lb 4 oz (111.698 kg), SpO2 100 %.  PE:  A/O Appropriate Pleasant No facial droop Tongue medline EOM Heart: RRR Lungs: CTA Abd: soft; +BS; NT Extr: RUE without issue Good strength and sensation Moves well          LUE; weaker; able to pronate; able to squeeze but weak B Lower extr: = in strength and sensation Moves well Ambulating in room UOP good and yellow  Results for orders placed or performed during the hospital encounter of 09/20/15  APTT  Result Value Ref Range   aPTT 26 24 - 37 seconds  Basic metabolic panel  Result Value Ref Range   Sodium 140 135 - 145 mmol/L   Potassium 4.0 3.5 - 5.1 mmol/L   Chloride 104 101 - 111 mmol/L   CO2 24 22 - 32 mmol/L   Glucose, Bld 104 (H) 65 - 99 mg/dL   BUN 16 6 - 20 mg/dL   Creatinine, Ser 0.94 0.61 - 1.24 mg/dL   Calcium 9.1 8.9 - 10.3 mg/dL   GFR calc non Af Amer >60 >60 mL/min   GFR calc Af Amer >60 >60 mL/min   Anion gap 12 5 - 15  CBC WITH DIFFERENTIAL  Result Value Ref Range   WBC 11.8 (H) 4.0 - 10.5 K/uL   RBC 4.74 4.22 - 5.81 MIL/uL   Hemoglobin 13.7 13.0 - 17.0 g/dL   HCT 41.5 39.0 - 52.0 %   MCV 87.6 78.0 - 100.0 fL   MCH  28.9 26.0 - 34.0 pg   MCHC 33.0 30.0 - 36.0 g/dL   RDW 13.7 11.5 - 15.5 %   Platelets 306 150 - 400 K/uL   Neutrophils Relative % 63 %   Neutro Abs 7.5 1.7 - 7.7 K/uL   Lymphocytes Relative 22 %   Lymphs Abs 2.6 0.7 - 4.0 K/uL   Monocytes Relative 8 %   Monocytes Absolute 0.9 0.1 - 1.0 K/uL   Eosinophils Relative 6 %   Eosinophils Absolute 0.7 0.0 - 0.7 K/uL   Basophils Relative 1 %   Basophils Absolute 0.1 0.0 - 0.1 K/uL  Platelet inhibition p2y12 (not at [facility])  Result Value Ref Range   Platelet Function  P2Y12 158 (L) 194 - 418 PRU  Protime-INR  Result Value Ref Range   Prothrombin Time 13.3 11.6 - 15.2 seconds   INR 0.99 0.00 - 1.49  Glucose, capillary  Result  Value Ref Range   Glucose-Capillary 98 65 - 99 mg/dL  Heparin level (unfractionated)  Result Value Ref Range   Heparin Unfractionated <0.10 (L) 0.30 - 0.70 IU/mL  Basic metabolic panel  Result Value Ref Range   Sodium 139 135 - 145 mmol/L   Potassium 3.7 3.5 - 5.1 mmol/L   Chloride 106 101 - 111 mmol/L   CO2 23 22 - 32 mmol/L   Glucose, Bld 93 65 - 99 mg/dL   BUN 8 6 - 20 mg/dL   Creatinine, Ser 0.80 0.61 - 1.24 mg/dL   Calcium 7.9 (L) 8.9 - 10.3 mg/dL   GFR calc non Af Amer >60 >60 mL/min   GFR calc Af Amer >60 >60 mL/min   Anion gap 10 5 - 15  CBC WITH DIFFERENTIAL  Result Value Ref Range   WBC 11.5 (H) 4.0 - 10.5 K/uL   RBC 4.27 4.22 - 5.81 MIL/uL   Hemoglobin 12.1 (L) 13.0 - 17.0 g/dL   HCT 36.5 (L) 39.0 - 52.0 %   MCV 85.5 78.0 - 100.0 fL   MCH 28.3 26.0 - 34.0 pg   MCHC 33.2 30.0 - 36.0 g/dL   RDW 13.8 11.5 - 15.5 %   Platelets 235 150 - 400 K/uL   Neutrophils Relative % 71 %   Neutro Abs 8.2 (H) 1.7 - 7.7 K/uL   Lymphocytes Relative 17 %   Lymphs Abs 2.0 0.7 - 4.0 K/uL   Monocytes Relative 8 %   Monocytes Absolute 0.9 0.1 - 1.0 K/uL   Eosinophils Relative 4 %   Eosinophils Absolute 0.4 0.0 - 0.7 K/uL   Basophils Relative 0 %   Basophils Absolute 0.0 0.0 - 0.1 K/uL  Heparin level (unfractionated)  Result Value Ref Range   Heparin Unfractionated 0.12 (L) 0.30 - 0.70 IU/mL  Platelet inhibition p2y12 (Not at [facility])  Result Value Ref Range   Platelet Function  P2Y12 150 (L) 194 - 418 PRU  Heparin level (unfractionated)  Result Value Ref Range   Heparin Unfractionated 0.11 (L) 0.30 - 0.70 IU/mL  CBC  Result Value Ref Range   WBC 9.5 4.0 - 10.5 K/uL   RBC 4.31 4.22 - 5.81 MIL/uL   Hemoglobin 12.1 (L) 13.0 - 17.0 g/dL   HCT 37.5 (L) 39.0 - 52.0 %   MCV 87.0 78.0 - 100.0 fL   MCH 28.1 26.0 - 34.0 pg   MCHC 32.3 30.0 - 36.0 g/dL   RDW 13.6 11.5 - 15.5 %   Platelets 274 150 - 400 K/uL  Basic metabolic panel  Result Value Ref Range   Sodium 140 135 - 145 mmol/L     Potassium 3.8 3.5 - 5.1 mmol/L   Chloride 105 101 - 111 mmol/L   CO2 27 22 - 32 mmol/L   Glucose, Bld 89 65 - 99 mg/dL   BUN 8 6 - 20 mg/dL   Creatinine, Ser 0.88 0.61 - 1.24 mg/dL   Calcium 8.3 (L) 8.9 - 10.3 mg/dL   GFR calc non Af Amer >60 >60 mL/min   GFR calc Af Amer >60 >60 mL/min   Anion gap 8 5 - 15  Lipid panel  Result Value Ref Range   Cholesterol 158 0 - 200 mg/dL   Triglycerides 145 <150 mg/dL   HDL 34 (L) >40 mg/dL   Total CHOL/HDL Ratio 4.6 RATIO   VLDL 29 0 - 40 mg/dL   LDL Cholesterol 95 0 - 99 mg/dL  Hemoglobin A1c  Result Value Ref Range   Hgb A1c MFr Bld 6.1 (H) 4.8 - 5.6 %   Mean Plasma Glucose 128 mg/dL  ECHO COMPLETE  Result Value Ref Range   Weight 3940 oz   Height 68 in   BP 136/74 mmHg    Disposition: pericallosal aneurysm pipeline stent placed 09/20/15 in IR with Dr Estanislado Pandy Post procedure facial droop; LUE weakness and numbness New CVA Stable symptoms; actually improved IP Rehab has consulted and evaluated pt Plan now for transfer to IP Rehab MD We will continue to follow Plan for follow up with Dr Jaclyn Prime will hear from scheduler for time and date   Discharge Instructions    Discharge instructions    Complete by:  As directed   To IP Rehab; plan per Rehab MD            Medication List    TAKE these medications        amLODipine 10 MG tablet  Commonly known as:  NORVASC  Take 1 tablet (10 mg total) by mouth daily.     aspirin 325 MG EC tablet  Take 1 tablet (325 mg total) by mouth daily.     baclofen 10 MG tablet  Commonly known as:  LIORESAL  Take 1 tablet (10 mg total) by mouth at bedtime.     chlorthalidone 25 MG tablet  Commonly known as:  HYGROTON  Take 25 mg by mouth daily.     clopidogrel 75 MG tablet  Commonly known as:  PLAVIX  Take 1 tablet (75 mg total) by mouth daily.     divalproex 500 MG DR tablet  Commonly known as:  DEPAKOTE  Take 1 tablet (500 mg total) by mouth every 12 (twelve) hours.      escitalopram 10 MG tablet  Commonly known as:  LEXAPRO  Take 10 mg by mouth daily.     fluticasone 50 MCG/ACT nasal spray  Commonly known as:  FLONASE  Place 2 sprays into the nose daily.     LIPITOR 10 MG tablet  Generic drug:  atorvastatin  Take 10 mg by mouth daily at 6 PM.     lisinopril 20 MG tablet  Commonly known as:  PRINIVIL,ZESTRIL  Take 2 tablets (40 mg total) by mouth every morning.     metoprolol 50 MG tablet  Commonly known as:  LOPRESSOR  Take 2 tablets (100 mg total) by mouth 2 (two) times daily.          Electronically Signed: Monia Sabal A 09/23/2015, 11:38 AM   I have spent Greater Than 30  Minutes discharging [place].

## 2015-09-23 NOTE — Care Management Note (Signed)
Case Management Note  Patient Details  Name: [name] MRN: [id number] Date of Birth: 1956/02/18  Subjective/Objective:  Pt medically stable for dc today, and has been accepted for admission to inpatient rehab unit.  Insurance authorization received.                    Action/Plan: Plan dc to CIR later today.    Expected Discharge Date:    09/23/2015              Expected Discharge Plan:  [place]  In-House Referral:     Discharge planning Services  CM Consult  Post Acute Care Choice:    Choice offered to:     DME Arranged:    DME Agency:     HH Arranged:    HH Agency:     Status of Service:  Completed, signed off  Medicare Important Message Given:    Date Medicare IM Given:    Medicare IM give by:    Date Additional Medicare IM Given:    Additional Medicare Important Message give by:     If discussed at [place] of Stay Meetings, dates discussed:    Additional Comments:  Reinaldo Raddle, RN, BSN  Trauma/Neuro ICU Case Manager [phone]

## 2015-09-23 NOTE — Progress Notes (Signed)
  Echocardiogram 2D Echocardiogram with Definity has been performed.  [name] 09/23/2015, 10:06 AM

## 2015-09-23 NOTE — PMR Pre-admission (Signed)
PMR Admission Coordinator Pre-Admission Assessment  Patient: [name] is an 60 y.o., male MRN: [id number] DOB: 1956-02-23 Height: 5\' 8"  (172.7 cm) Weight: 111.698 kg (246 lb 4 oz)              Insurance Information HMO:    PPO: X     PCP:      IPA:      80/20:      OTHER:  PRIMARY: [employer]      Policy#: [id number]      Subscriber: Self CM Name: Alvie Heidelberg for Rayetta Humphrey     Phone#: [phone]     Fax#: [id number] Pre-Cert#: [id number]      Employer: Full Time Benefits:  Phone #: [phone]     Name: Mitzi Eff. Date: 10/20/15     Deduct: $1500.00      Out of Pocket Max: $6450.00      Life Max: N/A CIR: 80%/20%      SNF: 80%/20% Outpatient: OT/PT/SLP 80%    Co-Pay: 20% Home Health: 80%      Co-Pay: 20% DME: 80%     Co-Pay: 20% Providers: in-network  Medicaid Application Date:       Case Manager:  Disability Application Date:       Case Worker:   Emergency Facilities manager Information    Name Relation Home Work Viola Spouse  [phone]    No name specified       Gowin,Rene Daughter   [phone]   Fiala,[name] Daughter   [phone]     Current Medical History  Patient Admitting Diagnosis: Right MCA aneurysm status post stent   History of Present Illness: [name] is a 60 y.o. right handed male with history of hypertension, migraine headaches. Per chart review patient lives alone independently prior to admission. One level home 14 steps to entry. Patient works at [employer]. Presented with numerous ED evaluations for headaches. Placed on Depakote for headache prevention. Admitted 09/14/2015 with facial numbness and headache. MRI of the brain showed no acute intracranial process. 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 No emergent large vessel occlusion. 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 Underwent pipeline stenting 09/20/2015 per interventional  radiology. EEG showed no seizure. 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 Echocardiogram with ejection fraction of 65% no wall motion abnormalities. Neurology follow-up currently continues on aspirin and Plavix therapy. Maintain on a regular diet. Physical and occupational therapy evaluations completed with recommendations of physical medicine rehabilitation consult. Patient was admitted for a comprehensive rehabilitation program.   NIH Total: 4    Past Medical History  Past Medical History  Diagnosis Date  . Chest pain, atypical     12/2003:  negative cardiolyte  . HTN (hypertension)   . HLD (hyperlipidemia)   . Depression   . Hx of tear of ACL (anterior cruciate ligament)     right  . History of meniscal tear     bilateral  . Erectile dysfunction   . Insomnia   . Eczema   . Olecranon bursitis of left elbow 10/2009    s/p I&D by Dr Maxie Better, initially assessed by Dr. Nori Riis   . Allergy   . Anxiety   . Cataract   . Prediabetes   . TIA (transient ischemic attack)     Family History  family history  includes Aneurysm (age of onset: 82) in his mother; Diabetes Mellitus II in his sister; Emphysema in his father; Heart failure in his father; Hypertension in his mother; Stroke in his mother. There is no history of Colon cancer, Rectal cancer, or Stomach cancer.  Prior Rehab/Hospitalizations:  Has the patient had major surgery during 100 days prior to admission? No  Current Medications   Current facility-administered medications:  .  0.9 %  sodium chloride infusion, , Intravenous, Continuous, Luanne Bras, MD, Stopped at 09/22/15 1300 .  0.9 %  sodium chloride infusion, , Intravenous, Continuous, Luanne Bras, MD, Stopped at 09/20/15 2240 .  acetaminophen (TYLENOL) tablet 650 mg, 650 mg, Oral, Q6H PRN, 650 mg at 09/22/15 0211 **OR** [DISCONTINUED] acetaminophen (TYLENOL) suppository 650 mg, 650  mg, Rectal, Q6H PRN, Luanne Bras, MD .  amLODipine (NORVASC) tablet 10 mg, 10 mg, Oral, Daily, Monia Sabal, PA-C, 10 mg at 09/23/15 1137 .  aspirin EC tablet 325 mg, 325 mg, Oral, Daily, Monia Sabal, PA-C, 325 mg at 09/23/15 1135 .  atorvastatin (LIPITOR) tablet 10 mg, 10 mg, Oral, q1800, Monia Sabal, PA-C, 10 mg at 09/22/15 1817 .  baclofen (LIORESAL) tablet 10 mg, 10 mg, Oral, QHS, Monia Sabal, PA-C, 10 mg at 09/22/15 2115 .  butalbital-acetaminophen-caffeine (FIORICET, ESGIC) 50-325-40 MG per tablet 1 tablet, 1 tablet, Oral, Q8H PRN, Rosalin Hawking, MD, 1 tablet at 09/22/15 1358 .  chlorthalidone (HYGROTON) tablet 25 mg, 25 mg, Oral, Daily, Monia Sabal, PA-C, 25 mg at 09/23/15 1136 .  clopidogrel (PLAVIX) tablet 75 mg, 75 mg, Oral, Q breakfast, Luanne Bras, MD, 75 mg at 09/23/15 0808 .  divalproex (DEPAKOTE) DR tablet 500 mg, 500 mg, Oral, Q12H, Monia Sabal, PA-C, 500 mg at 09/23/15 1136 .  escitalopram (LEXAPRO) tablet 10 mg, 10 mg, Oral, Daily, Monia Sabal, PA-C, 10 mg at 09/23/15 1136 .  fluticasone (FLONASE) 50 MCG/ACT nasal spray 2 spray, 2 spray, Each Nare, Daily PRN, Monia Sabal, PA-C .  lactated ringers infusion, , Intravenous, Continuous, Rica Koyanagi, MD, Last Rate: 10 mL/hr at 09/20/15 2000 .  lisinopril (PRINIVIL,ZESTRIL) tablet 40 mg, 40 mg, Oral, q morning - 10a, Monia Sabal, PA-C, 40 mg at 09/23/15 1137 .  metoprolol (LOPRESSOR) tablet 100 mg, 100 mg, Oral, BID, Monia Sabal, PA-C, 100 mg at 09/23/15 1137 .  nicardipine (CARDENE) 20mg  in 0.86% saline 255ml IV infusion (0.1 mg/ml), 5-15 mg/hr, Intravenous, Continuous, Luanne Bras, MD, Stopped at 09/20/15 2218 .  ondansetron (ZOFRAN) injection 4 mg, 4 mg, Intravenous, Q6H PRN, Luanne Bras, MD  Patients Current Diet: Diet Heart Room service appropriate?: Yes; Fluid consistency:: Thin  Precautions / Restrictions Precautions Precautions: Fall Restrictions Weight Bearing Restrictions: No    Has the patient had 2 or more falls or a fall with injury in the past year?No, 1 fall   Prior Activity Level Community (5-7x/wk): Patient was completely independent prior to admission.  He worked full time, was on his feet walking all shift and drove.  Home Assistive Devices / Equipment Home Assistive Devices/Equipment: None Home Equipment: None  Prior Device Use: Indicate devices/aids used by the patient prior to current illness, exacerbation or injury? None of the above  Prior Functional Level Prior Function Level of Independence: Independent  Self Care: Did the patient need help bathing, dressing, using the toilet or eating?  Independent  Indoor Mobility: Did the patient need assistance with walking from room to room (with or without device)? Independent  Stairs: Did the patient need assistance with internal or external  stairs (with or without device)? Independent  Functional Cognition: Did the patient need help planning regular tasks such as shopping or remembering to take medications? Independent  Current Functional Level Cognition  Overall Cognitive Status: Impaired/Different from baseline Current Attention Level: Selective Orientation Level: Oriented X4 Safety/Judgement: Decreased awareness of deficits General Comments: MOCA provided. Pt immediately states "i did this test 2 weeks ago. I know the answers" Pt completed with score 23 out 30. Pt does report anxiety. Sister dropped daughter ( niece) oxygen tank prior to patient beginning assessment. Pt told family " you have to get that under control because you just really scared me." pt demonstrates visual deficits with head turns and verbalizations of eye changes.     Extremity Assessment (includes Sensation/Coordination)  Upper Extremity Assessment: LUE deficits/detail LUE Deficits / Details: numbness tingling and decr gross grasp  Lower Extremity Assessment: Defer to PT evaluation    ADLs  Overall ADL's : Needs  assistance/impaired Eating/Feeding Details (indicate cue type and reason): declined food at this time Grooming: Wash/dry hands, Min guard, Standing Grooming Details (indicate cue type and reason): pt was unable to get paper towel on L side and had to reach with R UE to get paper towel after 3 failed attempts. Toilet Transfer: Minimal assistance, Ambulation, Regular Toilet Toilet Transfer Details (indicate cue type and reason): pt static standing to void bladder. Pt noted to have blood in urine. pt reports urgency and inability to hold urine at this time. pt states "when i have to go i have to go now" Toileting- Water quality scientist and Hygiene: Minimal assistance (standing) Functional mobility during ADLs: Minimal assistance General ADL Comments: pt bumping into objects on L side and extending UE to help feel for environmental objects. pt completed moca assessement with 23 out 30 score indicating deficits. See visual changes    Mobility  Overal bed mobility: Needs Assistance Bed Mobility: Supine to Sit, Sit to Supine Supine to sit: Mod assist Sit to supine: Independent General bed mobility comments: reaching for therapist for (A)    Transfers  Overall transfer level: Needs assistance Equipment used: None Transfers: Sit to/from Stand Sit to Stand: Min guard General transfer comment: some instability in coming    Ambulation / Gait / Stairs / Emergency planning/management officer  Ambulation/Gait Ambulation/Gait assistance: Min guard, Min assist Ambulation Distance (Feet): 110 Feet Assistive device: None Gait Pattern/deviations: Step-through pattern, Drifts right/left General Gait Details: increased instability noted compared to previous session, additionally, patient with poor spatial awareness to the left,bumping into objects, with multiple balance checks requring min assist, one episode of increased moderate assist for stability.  Gait velocity: decreased Gait velocity interpretation: Below normal  speed for age/gender    Posture / Balance Balance Overall balance assessment: Needs assistance Sitting-balance support: Feet supported Sitting balance-Leahy Scale: Fair Standing balance support: During functional activity Standing balance-Leahy Scale: Fair Standing balance comment: able to perform static standing activities with increased lateral sway High level balance activites: Backward walking, Direction changes, Turns, Sudden stops, Head turns High Level Balance Comments: min to moderate assist performing higher level balance tasks    Special needs/care consideration BiPAP/CPAP: No CPM: No Continuous Drip IV: No Dialysis: No         Life Vest: No Oxygen: No Special Bed: No Trach Size: No Wound Vac (area): No       Skin: WDL  Bowel mgmt: 09/19/15 Bladder mgmt: frequency with urgency as a result intermittent incontinency  Diabetic mgmt: N/A     Previous Home Environment Living Arrangements: Alone Available Help at Discharge: Friend(s), Available PRN/intermittently Type of Home: Apartment Home Layout: One level Home Access: Stairs to enter Entrance Stairs-Rails: Left, Right, Can reach both Entrance Stairs-Number of Steps: 14 Bathroom Shower/Tub: Gaffer, Charity fundraiser: Standard Home Care Services: No Additional Comments: Pt works at [employer] 3rd shift.  pt normally sleeps during the day and awake at night so current hospital routine has caused some sleep pattern changes for the patient  Discharge Living Setting Plans for Discharge Living Setting: Patient's home Type of Home at Discharge: Other (Comment) (Condo) Discharge Home Layout: One level Discharge Home Access: Stairs to enter Entrance Stairs-Rails: Can reach both Entrance Stairs-Number of Steps: 3 with a landing then 14 Discharge Bathroom Shower/Tub: Tub/shower unit, Curtain Discharge Bathroom Toilet: Standard Discharge Bathroom Accessibility:  Yes How Accessible: Accessible via walker Does the patient have any problems obtaining your medications?: No  Social/Family/Support Systems Patient Roles: Partner, Other (Comment) (divorced) Anticipated Caregiver: daughters Debroah Baller (local available at night) [phone] Anticipated Caregiver's Contact Information: [name] (lives in [place]) [phone] Ability/Limitations of Caregiver: both daughters work and can assist intermittently  Caregiver Availability: Intermittent Discharge Plan Discussed with Primary Caregiver: No (discussed with patient and ex-wife; patient with Mod I goals) Is Caregiver In Agreement with Plan?:  (N/A) Does Caregiver/Family have Issues with Lodging/Transportation while Pt is in Rehab?: No  Goals/Additional Needs Patient/Family Goal for Rehab: PT/OT/SLP Mod I-Independent  Expected length of stay: 5-7 days Cultural Considerations: Christian  Dietary Needs: None Equipment Needs: TBD Special Service Needs: None Additional Information: None Pt/Family Agrees to Admission and willing to participate: Yes Program Orientation Provided & Reviewed with Pt/Caregiver Including Roles  & Responsibilities: Yes Additional Information Needs: N/A Information Needs to be Provided By: N/A  Decrease burden of Care through IP rehab admission: No  Possible need for SNF placement upon discharge: No  Patient Condition: This patient's condition remains as documented in the consult dated 09/22/15, in which the Rehabilitation Physician determined and documented that the patient's condition is appropriate for intensive rehabilitative care in an inpatient rehabilitation facility. Will admit to inpatient rehab today.  Preadmission Screen Completed By:  Gunnar Fusi, 09/23/2015 12:23 PM ______________________________________________________________________   Discussed status with Dr. Letta Pate on 09/23/15 at 1235 and received telephone approval for admission today.  Admission Coordinator:   Gunnar Fusi, time 1235/Date 09/23/15

## 2015-09-23 NOTE — Progress Notes (Signed)
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 Recommend CIR consult. Rec--further Rehab; consider inpt Rehab Consult in for evaluation now  Allergies: Review of patient's allergies indicates no known allergies.  Medications: Prior to Admission medications   Medication Sig Start Date End Date Taking? Authorizing Provider  amLODipine (NORVASC) 10 MG tablet Take 1 tablet (10 mg total) by mouth daily. 10/08/14  Yes Davonna Belling, MD  aspirin EC 325 MG EC tablet Take 1 tablet (325 mg total) by mouth daily. 09/17/15  Yes Shanker Kristeen Mans, MD  atorvastatin (LIPITOR) 10 MG tablet Take 10 mg by mouth daily at 6 PM.  12/01/14 12/01/15 Yes Historical Provider, MD  baclofen (LIORESAL) 10 MG tablet Take 1 tablet (10 mg total) by mouth at bedtime. 09/17/15  Yes Shanker Kristeen Mans, MD  chlorthalidone (HYGROTON) 25 MG tablet Take 25 mg by mouth daily. 07/29/15 10/27/15 Yes Historical Provider, MD  clopidogrel (PLAVIX) 75 MG tablet Take 1 tablet (75 mg total) by mouth daily. 09/17/15  Yes Shanker Kristeen Mans, MD  divalproex (DEPAKOTE) 500 MG DR tablet Take 1 tablet (500 mg total) by mouth every 12 (twelve) hours. 09/17/15  Yes Shanker Kristeen Mans, MD  escitalopram (LEXAPRO) 10 MG tablet Take 10 mg by mouth daily.  11/29/14  Yes Historical Provider, MD  lisinopril (PRINIVIL,ZESTRIL) 20 MG tablet Take 2 tablets (40 mg total) by mouth every morning. 10/08/14  Yes Davonna Belling, MD  metoprolol (LOPRESSOR) 50 MG tablet Take 2 tablets (100 mg total) by mouth 2 (two) times daily. 09/17/15  Yes Shanker Kristeen Mans, MD  fluticasone (FLONASE) 50  MCG/ACT nasal spray Place 2 sprays into the nose daily. Patient taking differently: Place 2 sprays into the nose daily as needed for allergies.  09/07/12   Cleatrice Burke, PA-C     Vital Signs: BP 136/74 mmHg  Pulse 59  Temp(Src) 98.3 F (36.8 C) (Axillary)  Resp 21  Ht 5\' 8"  (1.727 m)  Wt 246 lb 4 oz (111.698 kg)  BMI 37.45 kg/m2  SpO2 98%  Physical Exam  HENT:  Face symmetrical Tongue midline NO facial droop  Eyes: EOM are normal.  Abdominal: Soft.  Musculoskeletal: Normal range of motion.  Left arm moves better today Can pronate hand slowly Moves fingers and can create fist---although weaker than Rt  Nursing note and vitals reviewed.   Imaging: Ct Angio Head W/cm &/or Wo Cm  09/20/2015  CLINICAL DATA:  Initial evaluation for acute left-sided weakness status post pipeline device placement for pericallosal aneurysm. Symptoms now all resolved. EXAM: CT ANGIOGRAPHY HEAD AND NECK TECHNIQUE: Multidetector CT imaging of the head and neck was performed using the standard protocol during bolus administration of intravenous contrast. Multiplanar CT image reconstructions and MIPs were obtained to evaluate the vascular anatomy. Carotid stenosis measurements (when applicable) are obtained utilizing NASCET criteria, using the distal internal carotid diameter as the denominator. CONTRAST:  50 cc of Isovue 370. COMPARISON:  Prior studies from earlier same day. FINDINGS: CTA NECK Aortic arch: Visualized aortic arch of normal caliber with normal branch pattern. No high-grade stenosis at the origin of the great vessels. Visualized subclavian arteries widely patent. Right carotid system: Right  common carotid artery patent from its origin to the bifurcation. Mild noncalcified plaque about the right bifurcation without stenosis. Right ICA widely patent from the bifurcation to the skullbase. No stenosis, dissection, or vascular occlusion within the right carotid artery system. Left carotid system: Left  common carotid artery patent from its origin to the bifurcation. Minimal calcified noncalcified plaque about the left bifurcation without stenosis. Left ICA patent from the bifurcation to the skullbase. No stenosis, dissection, or vascular occlusion within the left carotid artery system. Vertebral arteries:Both vertebral arteries arise from the subclavian arteries. Left vertebral artery is dominant. Vertebral arteries patent without stenosis, dissection, or occlusion. 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 No worrisome lytic or blastic osseous lesions. Other neck: Visualized lungs are clear. Visualized superior mediastinum within normal limits. Few scattered subcentimeter hypodense nodules noted within the thyroid gland, of doubtful clinical significance. No adenopathy within the neck. No acute soft tissue abnormality. CTA HEAD Anterior circulation: Petrous segments widely patent bilaterally. Minimal atheromatous plaque within the cavernous ICAs without stenosis. Supraclinoid segments widely patent. A1 segments well opacified. Dominant right ACA which is widely patent. 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 Persistent filling of the aneurysm which measures 8 x 7 x 9 mm. Flow seen distally the pipeline device within the distal pericallosal branches. M1 segments patent without stenosis or occlusion. MCA bifurcations normal. No proximal M2 branch occlusion or stenosis. Distal MCA branches well opacified. Posterior circulation: Vertebral arteries patent to the vertebrobasilar junction. Left vertebral artery dominant. Posterior inferior cerebral arteries patent. Basilar artery widely patent. Superior cerebellar arteries and posterior cerebral arteries well opacified. Prominent right posterior communicating artery with hypoplastic right P1 segment. Venous sinuses: No  evidence for venous sinus thrombosis. Anatomic variants: No significant anatomic variant. Delayed phase: Not performed. IMPRESSION: 1. No large or proximal arterial branch occlusion. No complication status post recent catheter directed arteriogram. 2. 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 3. Mild intracranial and extracranial atheromatous disease as above. Electronically Signed   By: Jeannine Boga M.D.   On: 09/20/2015 22:49   Ct Head Wo Contrast  09/20/2015  ADDENDUM REPORT: 09/20/2015 21:54 ADDENDUM: These results were called by telephone at the time of interpretation on 09/20/2015 at 9:49 pm to Dr. Wallie Char, who verbally acknowledged these results. Electronically Signed   By: Logan Bores M.D.   On: 09/20/2015 21:54  09/20/2015  CLINICAL DATA:  Code stroke. Left-sided facial droop. Status post endovascular treatment of pericallosal aneurysm earlier today. EXAM: CT HEAD WITHOUT CONTRAST TECHNIQUE: Contiguous axial images were obtained from the base of the skull through the vertex without intravenous contrast. COMPARISON:  Head MRI 09/15/2015 and CT 09/14/2015 FINDINGS: There has been interval placement of a pipeline stent for treatment of the previously described pericallosal aneurysm. No acute cortical infarct, intracranial hemorrhage, mass, midline shift, or extra-axial fluid collection is identified. Scattered, small foci of hypoattenuation in the cerebral white matter bilaterally are similar to the prior CT and nonspecific but compatible with mild chronic small vessel ischemic disease. Ventricles and sulci are normal in size. Orbits are unremarkable. Mild paranasal sinus mucosal thickening and prior left mastoidectomy are noted. IMPRESSION: 1. No evidence of acute intracranial abnormality following recent endovascular pericallosal aneurysm treatment. 2. Mild chronic small vessel ischemic disease. Electronically Signed: By: Logan Bores M.D. On: 09/20/2015 21:47    Ct Angio Neck W/cm &/or Wo/cm  09/20/2015  CLINICAL DATA:  Initial evaluation for acute left-sided weakness status post pipeline device placement for pericallosal aneurysm. Symptoms now all resolved. EXAM: CT ANGIOGRAPHY HEAD AND NECK TECHNIQUE: Multidetector CT imaging of the head and neck was performed using the standard protocol during bolus administration of intravenous contrast. Multiplanar CT image reconstructions and MIPs were obtained to evaluate the vascular anatomy. Carotid stenosis measurements (when applicable) are obtained utilizing NASCET criteria, using the distal internal carotid diameter as the denominator. CONTRAST:  50 cc of Isovue 370. COMPARISON:  Prior studies from earlier same day. FINDINGS: CTA NECK Aortic arch: Visualized aortic arch of normal caliber with normal branch pattern. No high-grade stenosis at the origin of the great vessels. Visualized subclavian arteries widely patent. Right carotid system: Right common carotid artery patent from its origin to the bifurcation. Mild noncalcified plaque about the right bifurcation without stenosis. Right ICA widely patent from the bifurcation to the skullbase. No stenosis, dissection, or vascular occlusion within the right carotid artery system. Left carotid system: Left common carotid artery patent from its origin to the bifurcation. Minimal calcified noncalcified plaque about the left bifurcation without stenosis. Left ICA patent from the bifurcation to the skullbase. No stenosis, dissection, or vascular occlusion within the left carotid artery system. Vertebral arteries:Both vertebral arteries arise from the subclavian arteries. Left vertebral artery is dominant. Vertebral arteries patent without stenosis, dissection, or occlusion. 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 No worrisome lytic or blastic osseous lesions. Other neck: Visualized lungs are  clear. Visualized superior mediastinum within normal limits. Few scattered subcentimeter hypodense nodules noted within the thyroid gland, of doubtful clinical significance. No adenopathy within the neck. No acute soft tissue abnormality. CTA HEAD Anterior circulation: Petrous segments widely patent bilaterally. Minimal atheromatous plaque within the cavernous ICAs without stenosis. Supraclinoid segments widely patent. A1 segments well opacified. Dominant right ACA which is widely patent. 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 Persistent filling of the aneurysm which measures 8 x 7 x 9 mm. Flow seen distally the pipeline device within the distal pericallosal branches. M1 segments patent without stenosis or occlusion. MCA bifurcations normal. No proximal M2 branch occlusion or stenosis. Distal MCA branches well opacified. Posterior circulation: Vertebral arteries patent to the vertebrobasilar junction. Left vertebral artery dominant. Posterior inferior cerebral arteries patent. Basilar artery widely patent. Superior cerebellar arteries and posterior cerebral arteries well opacified. Prominent right posterior communicating artery with hypoplastic right P1 segment. Venous sinuses: No evidence for venous sinus thrombosis. Anatomic variants: No significant anatomic variant. Delayed phase: Not performed. IMPRESSION: 1. No large or proximal arterial branch occlusion. No complication status post recent catheter directed arteriogram. 2. 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 3. Mild intracranial and extracranial atheromatous disease as above. Electronically Signed   By: Jeannine Boga M.D.   On: 09/20/2015 22:49   Mr Brain Wo Contrast  09/21/2015  CLINICAL DATA:  Pipeline stent for pericallosal aneurysm diversion. Facial numbness. EXAM: MRI HEAD WITHOUT CONTRAST TECHNIQUE: Multiplanar, multiecho pulse sequences of the  brain and surrounding structures were obtained without intravenous contrast. COMPARISON:  MRI 09/15/2015 FINDINGS: Small, 3 mm areas of restricted diffusion in the high right frontal cortex, high right parietal cortex, and posterior right parietal cortex. These are most compatible with small embolic acute infarcts. No other acute infarct Artifact from pipeline stent in the anterior cerebral artery on the left. Ventricle size is normal.  Cerebral volume  is normal Scattered small hyperintense lesions in the cerebral white matter bilaterally and in the pons most compatible with chronic microvascular ischemia. Mild chronic ischemia in the thalamus bilaterally. No MR evidence of acute subarachnoid hemorrhage. Previous MRI demonstrated susceptibility in the left caudate head which is no longer visualized likely due to prior study being done on 3 tesla equipment in the current study 1.5 tesla equipment. Possible cavernoma. Mucosal edema in the paranasal sinuses bilaterally. 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 Pipeline stent left anterior cerebral artery. No evidence of acute intracranial hemorrhage. Electronically Signed   By: Franchot Gallo M.D.   On: 09/21/2015 14:21    Labs:  CBC:  Recent Labs  09/14/15 2049 09/14/15 2056 09/20/15 0905 09/21/15 0516 09/22/15 0354  WBC 12.1*  --  11.8* 11.5* 9.5  HGB 15.1 16.3 13.7 12.1* 12.1*  HCT 43.4 48.0 41.5 36.5* 37.5*  PLT 377  --  306 235 274    COAGS:  Recent Labs  09/14/15 2049 09/20/15 0905  INR 1.10 0.99  APTT 25 26    BMP:  Recent Labs  09/14/15 2049 09/14/15 2056 09/20/15 0905 09/21/15 0516 09/22/15 0354  NA 136 139 140 139 140  K 3.5 3.5 4.0 3.7 3.8  CL 100* 101 104 106 105  CO2 24  --  24 23 27   GLUCOSE 222* 215* 104* 93 89  BUN 26* 25* 16 8 8   CALCIUM 8.9  --  9.1 7.9* 8.3*  CREATININE 1.17 1.10 0.94 0.80 0.88  GFRNONAA >60  --  >60 >60 >60  GFRAA >60   --  >60 >60 >60    LIVER FUNCTION TESTS:  Recent Labs  09/14/15 2049  BILITOT 0.7  AST 21  ALT 26  ALKPHOS 74  PROT 7.6  ALBUMIN 3.9    Assessment and Plan:  Pericallosal aneurysm flow diverter stent placed 4/24 Post procedure CVA Evaluation by Dr Erlinda Hong All studies wnl Although PT/OT does rec continued rehab Inpt consult ordered  Report to Dr Estanislado Pandy  Electronically Signed: Monia Sabal A 09/23/2015, 8:20 AM   I spent a total of 25 Minutes at the the patient's bedside AND on the patient's hospital floor or unit, greater than 50% of which was counseling/coordinating care for pericollasal aneurysm embolization

## 2015-09-23 NOTE — NC FL2 (Signed)
[place] MEDICAID FL2 LEVEL OF CARE SCREENING TOOL     IDENTIFICATION  Patient Name: [name] Birthdate: 1955/06/05 Sex: male Admission Date (Current Location): 09/20/2015  [place] and [place] Number:  Kathleen Argue  (CIGNA/CIGNA [phone]) Facility and Address:  [facility] [place] [address], [place], [place] [address]      Provider Number: [id number]  Attending Physician Name and Address:  Luanne Bras, MD  Relative Name and Phone Number:       Current Level of Care: Hospital Recommended Level of Care: [place] Prior Approval Number:    Date Approved/Denied:   PASRR Number:    Discharge Plan: Other (Comment)    Current Diagnoses: Patient Active Problem List   Diagnosis Date Noted  . Drooping of mouth   . Stroke ([place])   . Benign essential HTN   . Migraine with aura and without status migrainosus, not intractable   . Tachypnea   . Prediabetes   . Acute blood loss anemia   . Brain aneurysm 09/20/2015  . Type 2 diabetes mellitus ([place]) 09/15/2015  . Leukocytosis 09/15/2015  . TIA (transient ischemic attack) 09/14/2015  . History of tear of ACL (anterior cruciate ligament) 03/08/2011  . Depression 09/07/2010  . URI (upper respiratory infection) 08/24/2010  . DECREASED HEARING, LEFT EAR 09/06/2006  . Hyperlipidemia 04/23/2006  . Essential hypertension 04/23/2006  . INSOMNIA 04/23/2006    Orientation RESPIRATION BLADDER Height & Weight     Self, Time, Situation, Place  Normal Continent Weight: 246 lb 4 oz (111.698 kg) Height:  5\' 8"  (172.7 cm)  BEHAVIORAL SYMPTOMS/MOOD NEUROLOGICAL BOWEL NUTRITION STATUS      Continent Diet (HEART HEALTHY)  AMBULATORY STATUS COMMUNICATION OF NEEDS Skin   Limited Assist   Normal                       Personal Care Assistance Level of Assistance  Total care       Total Care Assistance: Limited assistance   Functional Limitations Info  Sight Sight Info:  Impaired        SPECIAL CARE FACTORS FREQUENCY  PT (By licensed PT), OT (By licensed OT)       OT Frequency:  (2x/week)            Contractures      Additional Factors Info  Code Status, Allergies Code Status Info:  (FULL) Allergies Info:  (NO KNOWN ALLERGIES)           Current Medications (09/23/2015):  This is the current hospital active medication list Current Facility-Administered Medications  Medication Dose Route Frequency Provider Last Rate Last Dose  . 0.9 %  sodium chloride infusion   Intravenous Continuous Luanne Bras, MD   Stopped at 09/22/15 1300  . 0.9 %  sodium chloride infusion   Intravenous Continuous Luanne Bras, MD   Stopped at 09/20/15 2240  . acetaminophen (TYLENOL) tablet 650 mg  650 mg Oral Q6H PRN Rosalin Hawking, MD   650 mg at 09/22/15 0211  . amLODipine (NORVASC) tablet 10 mg  10 mg Oral Daily Monia Sabal, PA-C   10 mg at 09/22/15 0902  . aspirin EC tablet 325 mg  325 mg Oral Daily Monia Sabal, PA-C   325 mg at 09/22/15 [id number]  . atorvastatin (LIPITOR) tablet 10 mg  10 mg Oral q1800 Monia Sabal, PA-C   10 mg at 09/22/15 1817  . baclofen (LIORESAL) tablet 10 mg  10 mg Oral  QHS Monia Sabal, PA-C   10 mg at 09/22/15 2115  . butalbital-acetaminophen-caffeine (FIORICET, ESGIC) 50-325-40 MG per tablet 1 tablet  1 tablet Oral Q8H PRN Rosalin Hawking, MD   1 tablet at 09/22/15 1358  . chlorthalidone (HYGROTON) tablet 25 mg  25 mg Oral Daily Monia Sabal, PA-C   25 mg at 09/22/15 0905  . clopidogrel (PLAVIX) tablet 75 mg  75 mg Oral Q breakfast Luanne Bras, MD   75 mg at 09/23/15 0808  . divalproex (DEPAKOTE) DR tablet 500 mg  500 mg Oral Q12H Monia Sabal, PA-C   500 mg at 09/22/15 2115  . escitalopram (LEXAPRO) tablet 10 mg  10 mg Oral Daily Monia Sabal, PA-C   10 mg at 09/22/15 0900  . fluticasone (FLONASE) 50 MCG/ACT nasal spray 2 spray  2 spray Each Nare Daily PRN Monia Sabal, PA-C      . lactated ringers infusion   Intravenous  Continuous Rica Koyanagi, MD 10 mL/hr at 09/20/15 2000    . lisinopril (PRINIVIL,ZESTRIL) tablet 40 mg  40 mg Oral q morning - 10a Monia Sabal, PA-C   40 mg at 09/22/15 [id number]  . metoprolol (LOPRESSOR) tablet 100 mg  100 mg Oral BID Monia Sabal, PA-C   100 mg at 09/22/15 0902  . nicardipine (CARDENE) 20mg  in 0.86% saline 270ml IV infusion (0.1 mg/ml)  5-15 mg/hr Intravenous Continuous Luanne Bras, MD   Stopped at 09/20/15 2218  . ondansetron (ZOFRAN) injection 4 mg  4 mg Intravenous Q6H PRN Luanne Bras, MD      . perflutren lipid microspheres (DEFINITY) IV suspension  1-10 mL Intravenous PRN Rosalin Hawking, MD   2 mL at 09/23/15 [id number]     Discharge Medications: Please see discharge summary for a list of discharge medications.  Relevant Imaging Results:  Relevant Lab Results:   Additional Information  (SSN: [id number])  Leane Call, Student-SW [phone]

## 2015-09-23 NOTE — Clinical Social Work Note (Cosign Needed)
Clinical Social Work Assessment  Patient Details  Name: [name] MRN: [id number] Date of Birth: 1956-04-12  Date of referral:  09/23/15               Reason for consult:  Facility Placement, Discharge Planning                Permission sought to share information with:  Family Supports Permission granted to share information::  Yes, Verbal Permission Granted  Name::        Agency::   (SNF)  Relationship::     Contact Information:     Housing/Transportation Living arrangements for the past 2 months:  Single Family Home Source of Information:  Other (Comment Required) Patient Interpreter Needed:  None Criminal Activity/Legal Involvement Pertinent to Current Situation/Hospitalization:  No - Comment as needed Significant Relationships:  Significant Other Lives with:  Self Do you feel safe going back to the place where you live?  No Need for family participation in patient care:  Yes (Comment)  Care giving concerns:   Patient has not expressed any care giving concerns at this time.   Social Worker assessment / plan:   Patient a/o x4 however, very lethargic, flat, and nonchalant. BSW intern has spoken with patient and patient's ex at bedside to discuss discuss discharge disposition. Although PT is recommending CIR, BSW intern has presented patient with SNF list for BACKUP PLAN ONLY. BSW intern thoroughly explained SNF process as well as insurance coverage. Patient was agreeable to being d/c to SNF or CIR. Per patient's ex, patient is from home alone and would only have supervision provided at night time. Patient's ex states that she lives in [place] and would be unable to care for patient. Furthermore, patient's ex has two daughter whom also live out of town and work full-time therefore, they would only be able to tend to patient in the evenings. Patient has given BSW intern verbal permission to fax clinical to SNF within [facility]. BSW intern to f/u with patient and patient's  ex in reference to extended bed offers. Per patient's bedside nurse, there is a possibility that patient to d/c today, 09/23/15 or tomorrow, 09/24/15. BSW intern to continue following patient for further discharge plan.   Employment status:  Unemployed Nurse, adult PT Recommendations:  Inpatient Rehab Consult Information / Referral to community resources:  [facility]  Patient/Family's Response to care:   Patient and patient's ex appreciated Social Work intervention given by [employer].  Patient/Family's Understanding of and Emotional Response to Diagnosis, Current Treatment, and Prognosis:   Patient understands need for further treatment and rehab before returning home independently.   Emotional Assessment Appearance:  Appears stated age Attitude/Demeanor/Rapport:   (nonchalant) Affect (typically observed):  Flat Orientation:  Oriented to Self, Oriented to Place, Oriented to  Time, Oriented to Situation Alcohol / Substance use:  Not Applicable Psych involvement (Current and /or in the community):  No (Comment)  Discharge Needs  Concerns to be addressed:  No discharge needs identified Readmission within the last 30 days:  No Current discharge risk:  None Barriers to Discharge:  No Barriers Identified   Leane Call, Student-SW 09/23/2015, 9:45 AM

## 2015-09-23 NOTE — Progress Notes (Signed)
STROKE TEAM PROGRESS NOTE   SUBJECTIVE (INTERVAL HISTORY) Ex-wife is at bedside. He is getting TTE done, and doing well this am. Plan to CIR today.   However, around 2pm, he stated that he was lying in bed, again started left cheek, lip and gum numbness, with left hand numbness. RN also felt left facial droop. Came to see pt and he already felt much better. Still has some residue numbness. No HA as this time. Episode still concerning for complicated migraine vs. Seizure, will increase depakote to 750mg  Q8 and check depakote level.    OBJECTIVE Temp:  [97.9 F (36.6 C)-98.5 F (36.9 C)] 98.5 F (36.9 C) (04/27 1200) Pulse Rate:  [52-78] 60 (04/27 1400) Cardiac Rhythm:  [-] Normal sinus rhythm (04/27 0800) Resp:  [15-27] 21 (04/27 0600) BP: (88-159)/(49-78) 116/68 mmHg (04/27 1400) SpO2:  [93 %-100 %] 96 % (04/27 1400)  CBC:   Recent Labs Lab 09/20/15 0905 09/21/15 0516 09/22/15 0354  WBC 11.8* 11.5* 9.5  NEUTROABS 7.5 8.2*  --   HGB 13.7 12.1* 12.1*  HCT 41.5 36.5* 37.5*  MCV 87.6 85.5 87.0  PLT 306 235 [id number]    Basic Metabolic Panel:   Recent Labs Lab 09/21/15 0516 09/22/15 0354  NA 139 140  K 3.7 3.8  CL 106 105  CO2 23 27  GLUCOSE 93 89  BUN 8 8  CREATININE 0.80 0.88  CALCIUM 7.9* 8.3*    Lipid Panel:     Component Value Date/Time   CHOL 158 09/22/2015 0354   TRIG 145 09/22/2015 0354   HDL 34* 09/22/2015 0354   CHOLHDL 4.6 09/22/2015 0354   VLDL 29 09/22/2015 0354   LDLCALC 95 09/22/2015 0354   HgbA1c:  Lab Results  Component Value Date   HGBA1C 6.1* 09/22/2015   Urine Drug Screen:     Component Value Date/Time   LABOPIA NEG 06/07/2006 0039   COCAINSCRNUR NEG 06/07/2006 0039   LABBENZ NEG 06/07/2006 0039   AMPHETMU NEG 06/07/2006 0039      IMAGING I have personally reviewed the radiological images below and agree with the radiology interpretations.  Ct Head Wo Contrast 09/20/2015  1. No evidence of acute intracranial abnormality following  recent endovascular pericallosal aneurysm treatment. 2. Mild chronic small vessel ischemic disease.   Ct Angio Head & Neck W/cm &/or Wo/cm 09/20/2015  1. No large or proximal arterial branch occlusion. No complication status post recent catheter directed arteriogram. 2. 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 3. Mild intracranial and extracranial atheromatous disease as above.   Mr Brain Wo Contrast 09/21/2015  Three small areas of acute infarct in the right MCA territory likely areas of embolic infarction. Pipeline stent left anterior cerebral artery. No evidence of acute intracranial hemorrhage.   TTE - - Left ventricle: The cavity size was normal. Systolic function was  normal. The estimated ejection fraction was in the range of 60%  to 65%. Wall motion was normal; there were no regional wall  motion abnormalities. Left ventricular diastolic function  parameters were normal. - Mitral valve: There was no regurgitation. - Right ventricle: The cavity size was normal. Wall thickness was  normal. Systolic function was normal. - Atrial septum: No defect or patent foramen ovale was identified  by color flow Doppler. - Tricuspid valve: There was no regurgitation. - Inferior vena cava: The vessel was normal in size. The  respirophasic diameter changes were in the normal range (>= 50%),  consistent with normal central venous pressure.  EEG - Clinical Interpretation: This normal EEG is recorded in the waking and sleep state. There was no seizure or seizure predisposition recorded on this study. Please note that a normal EEG does not preclude the possibility of epilepsy.    PHYSICAL EXAM  Temp:  [97.9 F (36.6 C)-98.5 F (36.9 C)] 98.5 F (36.9 C) (04/27 1200) Pulse Rate:  [52-78] 60 (04/27 1400) Resp:  [15-27] 21 (04/27 0600) BP: (88-159)/(49-78) 116/68 mmHg (04/27 1400) SpO2:  [93 %-100 %] 96 % (04/27 1400)  General - Well nourished, well developed, in  no apparent distress.  Ophthalmologic - Fundi not visualized due to eye movement.  Cardiovascular - Regular rate and rhythm with no murmur.  Mental Status -  Level of arousal and orientation to time, place, and person were intact. Language including expression, naming, repetition, comprehension was assessed and found intact. Fund of Knowledge was assessed and was intact.  Cranial Nerves II - XII - II - Visual field intact OU. III, IV, VI - Extraocular movements intact. V - Facial sensation intact bilaterally. VII - left mild facial droop. VIII - Hearing & vestibular intact bilaterally. X - Palate elevates symmetrically, mild dysarthria. XI - Chin turning & shoulder shrug intact bilaterally. XII - Tongue protrusion intact.  Motor Strength - The patient's strength was normal in all extremities except LUE bicep 4+/5, tricep 4+/5, and hand grip 3/5 with dexterity difficulty and pronator drift was absent. Bulk was normal and fasciculations were absent.  Motor Tone - Muscle tone was assessed at the neck and appendages and was normal.  Reflexes - The patient's reflexes were 1+ in all extremities and he had no pathological reflexes.  Sensory - Light touch, temperature/pinprick were assessed and were symmetrical.   Coordination - The patient had normal movements in the hand with no ataxia or dysmetria. Tremor was absent.  Gait and Station - not tested due to safety concerns.   ASSESSMENT/PLAN Mr. REEF ADAIR is a 60 y.o. male with history of HTN, HLD, migraine with aura, anxiety and depression presenting with intermittent left facial numbness/weakness, slurry speech and left hand weakness and numbness s/p pipeline stent placement. He did not receive IV t-PA due to recent stent placement.   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   Diet Heart Room service appropriate?: Yes; Fluid consistency:: Thin  aspirin 325 mg daily and clopidogrel 75 mg daily prior to admission, now on aspirin 325 mg daily and clopidogrel 75 mg daily. IV heparin stopped  Patient counseled to be compliant with his antithrombotic medications  Ongoing aggressive stroke risk factor management  Therapy recommendations:  CIR. Consult placed  Disposition:  pending   Right ACA aneurysm s/p pipeline  IR on board  CTA head and neck showed good flow  Left facial and left hand intermittent numbness - complicated migraine vs. ? seizure  visual aura with tunnel vision and squiggly lines at home  bilateral face, lip and hands numbness at home  Intermittent left facial and left hand numbness with HA  On depakote for preventive meds       On tylenol and fioricet for abortive therapy  EEG negative for seizure  Increase depakote to 750mg  Q8h  Check depakote level in am  Will follow up to see him in CIR in am  Hypertension  Stable  BP goal normotensive  Hyperlipidemia  Home meds:  On lipitor, resumed in hospital  LDL 95, goal < 70  Continue statin at discharge  Other Stroke Risk Factors  Obesity, Body mass index is 37.45 kg/(m^2).   Family hx stroke (mother)  Other Active Problems  Depression on [facility] day # 3  The patient has again episode of left cheek, lip and hand numbness before d/c to CIR, came to see him, does not consider stroke, but likely complicated migraine vs. ? Seizure, EEG neg yesterday, will increase depakote to 750mg  Q8h to cover for both migraine and seizure. Will check depakote level. Will see him again tomorrow in CIR.    Rosalin Hawking, MD PhD Stroke Neurology 09/23/2015 2:28 PM    To contact Stroke Continuity provider, please refer to [email or web address] After  hours, contact General Neurology

## 2015-09-23 NOTE — Progress Notes (Signed)
Ankit Lorie Phenix, MD Physician Signed Physical Medicine and Rehabilitation Consult Note 09/22/2015 2:54 PM  Related encounter: Admission (Discharged) from 09/20/2015 in [facility]    Expand All Collapse All        Physical Medicine and Rehabilitation Consult Reason for Consult: Right MCA aneurysm status post stent Referring Physician: Dr. Estanislado Pandy   HPI: [name] is a 60 y.o. right handed male with history of hypertension, migraine headaches. Per chart review patient lives alone independently prior to admission. One level home 14 steps to entry. Patient works at [employer]. Presented with numerous ED evaluations for headaches. Placed on Depakote for headache prevention. Admitted 09/14/2015 with facial numbness and headache. MRI of the brain showed no acute intracranial process. 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 No emergent large vessel occlusion. 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 Underwent pipeline stenting 09/20/2015 per interventional radiology. EEG is pending. 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 Neurology follow-up currently continues on aspirin and Plavix therapy. Maintain on a regular diet. Physical and occupational therapy evaluations completed with recommendations of physical medicine rehabilitation consult.   Review of Systems  Constitutional: Negative for fever and chills.  HENT: Negative for hearing loss.  Eyes: Positive for blurred vision. Negative for double vision.  Respiratory: Negative for cough and shortness of breath.  Cardiovascular: Negative for chest pain.  Gastrointestinal: Positive for constipation.  Genitourinary: Positive for urgency.  Musculoskeletal: Positive for myalgias.  Skin: Negative for rash.  Neurological: Positive for focal  weakness and headaches. Negative for sensory change and seizures.  Psychiatric/Behavioral: Positive for depression. The patient has insomnia.  All other systems reviewed and are negative.  Past Medical History  Diagnosis Date  . Chest pain, atypical     12/2003: negative cardiolyte  . HTN (hypertension)   . HLD (hyperlipidemia)   . Depression   . Hx of tear of ACL (anterior cruciate ligament)     right  . History of meniscal tear     bilateral  . Erectile dysfunction   . Insomnia   . Eczema   . Olecranon bursitis of left elbow 10/2009    s/p I&D by Dr Maxie Better, initially assessed by Dr. Nori Riis   . Allergy   . Anxiety   . Cataract   . Prediabetes   . TIA (transient ischemic attack)    Past Surgical History  Procedure Laterality Date  . Skin tag removal      11 removed  . Knee arthroscopy    . Incise and drain abcess      L elbow due to cellulitis/bursitis  . Inner ear surgery    . Radiology with anesthesia N/A 09/20/2015    Procedure: EMBOLIZATION (RADIOLOGY WITH ANESTHESIA); Surgeon: Luanne Bras, MD; Location: [facility]; Service: Radiology; Laterality: N/A;   Family History  Problem Relation Age of Onset  . Colon cancer Neg Hx   . Rectal cancer Neg Hx   . Stomach cancer Neg Hx   . Stroke Mother   . Hypertension Mother   . Aneurysm Mother 62    Died of brain aneursym  . Heart failure Father   . Emphysema Father   . Diabetes Mellitus II Sister    Social History:  reports that he has never smoked. He has never used smokeless tobacco. He reports that  he does not drink alcohol or use illicit drugs. Allergies: No Known Allergies Medications Prior to Admission  Medication Sig Dispense Refill  . amLODipine (NORVASC) 10 MG tablet Take 1 tablet (10 mg total) by mouth daily. 30 tablet 0  . aspirin EC 325 MG EC tablet  Take 1 tablet (325 mg total) by mouth daily. 30 tablet 0  . atorvastatin (LIPITOR) 10 MG tablet Take 10 mg by mouth daily at 6 PM.     . baclofen (LIORESAL) 10 MG tablet Take 1 tablet (10 mg total) by mouth at bedtime. 30 each 0  . chlorthalidone (HYGROTON) 25 MG tablet Take 25 mg by mouth daily.    . clopidogrel (PLAVIX) 75 MG tablet Take 1 tablet (75 mg total) by mouth daily. 30 tablet 0  . divalproex (DEPAKOTE) 500 MG DR tablet Take 1 tablet (500 mg total) by mouth every 12 (twelve) hours. 60 tablet 0  . escitalopram (LEXAPRO) 10 MG tablet Take 10 mg by mouth daily.     Marland Kitchen lisinopril (PRINIVIL,ZESTRIL) 20 MG tablet Take 2 tablets (40 mg total) by mouth every morning. 60 tablet 0  . metoprolol (LOPRESSOR) 50 MG tablet Take 2 tablets (100 mg total) by mouth 2 (two) times daily. 120 tablet 0  . fluticasone (FLONASE) 50 MCG/ACT nasal spray Place 2 sprays into the nose daily. (Patient taking differently: Place 2 sprays into the nose daily as needed for allergies. 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 pt normally sleeps during the day and awake at night so current hospital routine has caused some sleep pattern changes for the patient  Functional History: Prior Function Level of Independence: Independent Functional Status:  Mobility: Bed Mobility Overal bed mobility: Needs Assistance Bed Mobility: Supine to Sit, Sit to Supine Supine to sit: Mod assist Sit to  supine: Independent General bed mobility comments: reaching for therapist for (A) Transfers Overall transfer level: Needs assistance Equipment used: None Transfers: Sit to/from Stand Sit to Stand: Min guard General transfer comment: some instability in coming Ambulation/Gait Ambulation/Gait assistance: Min guard, Min assist Ambulation Distance (Feet): 110 Feet Assistive device: None Gait Pattern/deviations: Step-through pattern, Drifts right/left General Gait Details: increased instability noted compared to previous session, additionally, patient with poor spatial awareness to the left,bumping into objects, with multiple balance checks requring min assist, one episode of increased moderate assist for stability.  Gait velocity: decreased Gait velocity interpretation: Below normal speed for age/gender    ADL: ADL Overall ADL's : Needs assistance/impaired Eating/Feeding Details (indicate cue type and reason): declined food at this time Grooming: Wash/dry hands, Min guard, Standing Grooming Details (indicate cue type and reason): pt was unable to get paper towel on L side and had to reach with R UE to get paper towel after 3 failed attempts. Toilet Transfer: Minimal assistance, Ambulation, Regular Toilet Toilet Transfer Details (indicate cue type and reason): pt static standing to void bladder. Pt noted to have blood in urine. pt reports urgency and inability to hold urine at this time. pt states "when i have to go i have to go nowNaval architect and Hygiene:  Minimal assistance (standing) Functional mobility during ADLs: Minimal assistance General ADL Comments: pt bumping into objects on L side and extending UE to help feel for environmental objects. pt completed moca assessement with 23 out 30 score indicating deficits. See visual changes  Cognition: Cognition Overall Cognitive Status: Impaired/Different from baseline Orientation Level: Oriented  X4 Cognition Arousal/Alertness: Awake/alert Behavior During Therapy: Impulsive Overall Cognitive Status: Impaired/Different from baseline Area of Impairment: Attention, Safety/judgement, Awareness Current Attention Level: Selective Safety/Judgement: Decreased awareness of deficits Awareness: Emergent General Comments: MOCA provided. Pt immediately states "i did this test 2 weeks ago. I know the answers" Pt completed with score 23 out 30. Pt does report anxiety. Sister dropped daughter ( niece) oxygen tank prior to patient beginning assessment. Pt told family " you have to get that under control because you just really scared me." pt demonstrates visual deficits with head turns and verbalizations of eye changes.   Blood pressure 123/63, pulse 59, temperature 99 F (37.2 C), temperature source Oral, resp. rate 14, height 5\' 8"  (1.727 m), weight 111.698 kg (246 lb 4 oz), SpO2 95 %. Physical Exam  Vitals reviewed. Constitutional: He is oriented to person, place, and time. He appears well-developed and well-nourished.  HENT:  Head: Normocephalic and atraumatic.  Eyes: Conjunctivae and EOM are normal.  Neck: Normal range of motion. Neck supple. No thyromegaly present.  Cardiovascular: Normal rate and regular rhythm.  Respiratory: Effort normal and breath sounds normal. No respiratory distress.  GI: Soft. Bowel sounds are normal. He exhibits no distension.  Musculoskeletal: He exhibits no edema or tenderness.  Neurological: He is alert and oriented to person, place, and time.  Follows commands.  Fair awareness of deficits Mild left facial droop. Tongue is midline Sensation intact to light touch DTRs symmetric Motor: RUE/RLE: 5/5 proximal to distal LLE: 4+/5 proximal to distal LUE: 4/5 proximal to distal  Skin: Skin is warm and dry.  Psychiatric: He has a normal mood and affect. His behavior is normal.     Lab Results Last 24 Hours    Results for orders placed or performed during  the hospital encounter of 09/20/15 (from the past 24 hour(s))  Heparin level (unfractionated) Status: Abnormal   Collection Time: 09/21/15 3:09 PM  Result Value Ref Range   Heparin Unfractionated 0.12 (L) 0.30 - 0.70 IU/mL  Platelet inhibition p2y12 (Not at [facility]) Status: Abnormal   Collection Time: 09/21/15 3:09 PM  Result Value Ref Range   Platelet Function P2Y12 150 (L) 194 - 418 PRU  Heparin level (unfractionated) Status: Abnormal   Collection Time: 09/21/15 8:50 PM  Result Value Ref Range   Heparin Unfractionated 0.11 (L) 0.30 - 0.70 IU/mL  CBC Status: Abnormal   Collection Time: 09/22/15 3:54 AM  Result Value Ref Range   WBC 9.5 4.0 - 10.5 K/uL   RBC 4.31 4.22 - 5.81 MIL/uL   Hemoglobin 12.1 (L) 13.0 - 17.0 g/dL   HCT 37.5 (L) 39.0 - 52.0 %   MCV 87.0 78.0 - 100.0 fL   MCH 28.1 26.0 - 34.0 pg   MCHC 32.3 30.0 - 36.0 g/dL   RDW 13.6 11.5 - 15.5 %   Platelets 274 150 - 400 K/uL  Basic metabolic panel Status: Abnormal   Collection Time: 09/22/15 3:54 AM  Result Value Ref Range   Sodium 140 135 - 145 mmol/L   Potassium 3.8 3.5 - 5.1 mmol/L   Chloride 105 101 - 111 mmol/L   CO2 27 22 - 32 mmol/L  Glucose, Bld 89 65 - 99 mg/dL   BUN 8 6 - 20 mg/dL   Creatinine, Ser 0.88 0.61 - 1.24 mg/dL   Calcium 8.3 (L) 8.9 - 10.3 mg/dL   GFR calc non Af Amer >60 >60 mL/min   GFR calc Af Amer >60 >60 mL/min   Anion gap 8 5 - 15  Lipid panel Status: Abnormal   Collection Time: 09/22/15 3:54 AM  Result Value Ref Range   Cholesterol 158 0 - 200 mg/dL   Triglycerides 145 <150 mg/dL   HDL 34 (L) >40 mg/dL   Total CHOL/HDL Ratio 4.6 RATIO   VLDL 29 0 - 40 mg/dL   LDL Cholesterol 95 0 - 99 mg/dL      Imaging Results (Last 48 hours)    Ct Angio Head W/cm &/or Wo Cm  09/20/2015 CLINICAL DATA: Initial  evaluation for acute left-sided weakness status post pipeline device placement for pericallosal aneurysm. Symptoms now all resolved. EXAM: CT ANGIOGRAPHY HEAD AND NECK TECHNIQUE: Multidetector CT imaging of the head and neck was performed using the standard protocol during bolus administration of intravenous contrast. Multiplanar CT image reconstructions and MIPs were obtained to evaluate the vascular anatomy. Carotid stenosis measurements (when applicable) are obtained utilizing NASCET criteria, using the distal internal carotid diameter as the denominator. CONTRAST: 50 cc of Isovue 370. COMPARISON: Prior studies from earlier same day. FINDINGS: CTA NECK Aortic arch: Visualized aortic arch of normal caliber with normal branch pattern. No high-grade stenosis at the origin of the great vessels. Visualized subclavian arteries widely patent. Right carotid system: Right common carotid artery patent from its origin to the bifurcation. Mild noncalcified plaque about the right bifurcation without stenosis. Right ICA widely patent from the bifurcation to the skullbase. No stenosis, dissection, or vascular occlusion within the right carotid artery system. Left carotid system: Left common carotid artery patent from its origin to the bifurcation. Minimal calcified noncalcified plaque about the left bifurcation without stenosis. Left ICA patent from the bifurcation to the skullbase. No stenosis, dissection, or vascular occlusion within the left carotid artery system. Vertebral arteries:Both vertebral arteries arise from the subclavian arteries. Left vertebral artery is dominant. Vertebral arteries patent without stenosis, dissection, or occlusion. 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 No worrisome lytic or blastic osseous lesions. Other neck: Visualized lungs are clear. Visualized superior mediastinum within normal limits. Few scattered  subcentimeter hypodense nodules noted within the thyroid gland, of doubtful clinical significance. No adenopathy within the neck. No acute soft tissue abnormality. CTA HEAD Anterior circulation: Petrous segments widely patent bilaterally. Minimal atheromatous plaque within the cavernous ICAs without stenosis. Supraclinoid segments widely patent. A1 segments well opacified. Dominant right ACA which is widely patent. 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 Persistent filling of the aneurysm which measures 8 x 7 x 9 mm. Flow seen distally the pipeline device within the distal pericallosal branches. M1 segments patent without stenosis or occlusion. MCA bifurcations normal. No proximal M2 branch occlusion or stenosis. Distal MCA branches well opacified. Posterior circulation: Vertebral arteries patent to the vertebrobasilar junction. Left vertebral artery dominant. Posterior inferior cerebral arteries patent. Basilar artery widely patent. Superior cerebellar arteries and posterior cerebral arteries well opacified. Prominent right posterior communicating artery with hypoplastic right P1 segment. Venous sinuses: No evidence for venous sinus thrombosis. Anatomic variants: No significant anatomic variant. Delayed phase: Not performed. IMPRESSION: 1. No large or proximal arterial branch  occlusion. No complication status post recent catheter directed arteriogram. 2. 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 3. Mild intracranial and extracranial atheromatous disease as above. Electronically Signed By: Jeannine Boga M.D. On: 09/20/2015 22:49   Ct Head Wo Contrast  09/20/2015 ADDENDUM REPORT: 09/20/2015 21:54 ADDENDUM: These results were called by telephone at the time of interpretation on 09/20/2015 at 9:49 pm to Dr. Wallie Char, who verbally acknowledged these results. Electronically Signed By: Logan Bores M.D. On:  09/20/2015 21:54  09/20/2015 CLINICAL DATA: Code stroke. Left-sided facial droop. Status post endovascular treatment of pericallosal aneurysm earlier today. EXAM: CT HEAD WITHOUT CONTRAST TECHNIQUE: Contiguous axial images were obtained from the base of the skull through the vertex without intravenous contrast. COMPARISON: Head MRI 09/15/2015 and CT 09/14/2015 FINDINGS: There has been interval placement of a pipeline stent for treatment of the previously described pericallosal aneurysm. No acute cortical infarct, intracranial hemorrhage, mass, midline shift, or extra-axial fluid collection is identified. Scattered, small foci of hypoattenuation in the cerebral white matter bilaterally are similar to the prior CT and nonspecific but compatible with mild chronic small vessel ischemic disease. Ventricles and sulci are normal in size. Orbits are unremarkable. Mild paranasal sinus mucosal thickening and prior left mastoidectomy are noted. IMPRESSION: 1. No evidence of acute intracranial abnormality following recent endovascular pericallosal aneurysm treatment. 2. Mild chronic small vessel ischemic disease. Electronically Signed: By: Logan Bores M.D. On: 09/20/2015 21:47   Ct Angio Neck W/cm &/or Wo/cm  09/20/2015 CLINICAL DATA: Initial evaluation for acute left-sided weakness status post pipeline device placement for pericallosal aneurysm. Symptoms now all resolved. EXAM: CT ANGIOGRAPHY HEAD AND NECK TECHNIQUE: Multidetector CT imaging of the head and neck was performed using the standard protocol during bolus administration of intravenous contrast. Multiplanar CT image reconstructions and MIPs were obtained to evaluate the vascular anatomy. Carotid stenosis measurements (when applicable) are obtained utilizing NASCET criteria, using the distal internal carotid diameter as the denominator. CONTRAST: 50 cc of Isovue 370. COMPARISON: Prior studies from earlier same day. FINDINGS: CTA NECK Aortic arch:  Visualized aortic arch of normal caliber with normal branch pattern. No high-grade stenosis at the origin of the great vessels. Visualized subclavian arteries widely patent. Right carotid system: Right common carotid artery patent from its origin to the bifurcation. Mild noncalcified plaque about the right bifurcation without stenosis. Right ICA widely patent from the bifurcation to the skullbase. No stenosis, dissection, or vascular occlusion within the right carotid artery system. Left carotid system: Left common carotid artery patent from its origin to the bifurcation. Minimal calcified noncalcified plaque about the left bifurcation without stenosis. Left ICA patent from the bifurcation to the skullbase. No stenosis, dissection, or vascular occlusion within the left carotid artery system. Vertebral arteries:Both vertebral arteries arise from the subclavian arteries. Left vertebral artery is dominant. Vertebral arteries patent without stenosis, dissection, or occlusion. 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 No worrisome lytic or blastic osseous lesions. Other neck: Visualized lungs are clear. Visualized superior mediastinum within normal limits. Few scattered subcentimeter hypodense nodules noted within the thyroid gland, of doubtful clinical significance. No adenopathy within the neck. No acute soft tissue abnormality. CTA HEAD Anterior circulation: Petrous segments widely patent bilaterally. Minimal atheromatous plaque within the cavernous ICAs without stenosis. Supraclinoid segments widely patent. A1 segments well opacified. Dominant right ACA which is widely patent. 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 Persistent filling of the  aneurysm which measures 8 x 7 x 9 mm. Flow seen distally the pipeline device within the distal pericallosal branches. M1 segments  patent without stenosis or occlusion. MCA bifurcations normal. No proximal M2 branch occlusion or stenosis. Distal MCA branches well opacified. Posterior circulation: Vertebral arteries patent to the vertebrobasilar junction. Left vertebral artery dominant. Posterior inferior cerebral arteries patent. Basilar artery widely patent. Superior cerebellar arteries and posterior cerebral arteries well opacified. Prominent right posterior communicating artery with hypoplastic right P1 segment. Venous sinuses: No evidence for venous sinus thrombosis. Anatomic variants: No significant anatomic variant. Delayed phase: Not performed. IMPRESSION: 1. No large or proximal arterial branch occlusion. No complication status post recent catheter directed arteriogram. 2. 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 3. Mild intracranial and extracranial atheromatous disease as above. Electronically Signed By: Jeannine Boga M.D. On: 09/20/2015 22:49   Mr Brain Wo Contrast  09/21/2015 CLINICAL DATA: Pipeline stent for pericallosal aneurysm diversion. Facial numbness. EXAM: MRI HEAD WITHOUT CONTRAST TECHNIQUE: Multiplanar, multiecho pulse sequences of the brain and surrounding structures were obtained without intravenous contrast. COMPARISON: MRI 09/15/2015 FINDINGS: Small, 3 mm areas of restricted diffusion in the high right frontal cortex, high right parietal cortex, and posterior right parietal cortex. These are most compatible with small embolic acute infarcts. No other acute infarct Artifact from pipeline stent in the anterior cerebral artery on the left. Ventricle size is normal. Cerebral volume is normal Scattered small hyperintense lesions in the cerebral white matter bilaterally and in the pons most compatible with chronic microvascular ischemia. Mild chronic ischemia in the thalamus bilaterally. No MR evidence of acute subarachnoid hemorrhage. Previous MRI demonstrated susceptibility in  the left caudate head which is no longer visualized likely due to prior study being done on 3 tesla equipment in the current study 1.5 tesla equipment. Possible cavernoma. Mucosal edema in the paranasal sinuses bilaterally. 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 Pipeline stent left anterior cerebral artery. No evidence of acute intracranial hemorrhage. Electronically Signed By: Franchot Gallo M.D. On: 09/21/2015 14:21     Assessment/Plan: Diagnosis: Right MCA aneurysm status post stent Labs and images independently reviewed. Records reviewed and summated above. Stroke: Continue secondary stroke prophylaxis and Risk Factor Modification listed below:  Antiplatelet therapy:  Blood Pressure Management: Continue current medication with prn's with permisive HTN per primary team Statin Agent:  Pre-diabetes management:  Left sided hemiparesis: fit for orthotics to prevent contractures (resting hand splint for day, wrist cock up splint at night, PRAFO, etc); PT/OT for mobility, ADL training  Motor recovery: Fluoxetine  1. Does the need for close, 24 hr/day medical supervision in concert with the patient's rehab needs make it unreasonable for this patient to be served in a less intensive setting? Yes  2. Co-Morbidities requiring supervision/potential complications: HTN (monitor and provide prns in accordance with increased physical exertion and pain), migraine headaches,(ensure pain does not limit functional progress in therapies), tachypnea (monitor RR and O2 Sats with increased physical exertion), ABLA (transfuse if necessary to ensure appropriate perfusion for increased activity tolerance), prediabetes (Monitor in accordance with exercise and adjust meds as necessary) 3. Due to safety, disease management, pain management and patient education, does the patient require 24 hr/day rehab nursing? Yes 4. Does the patient  require coordinated care of a physician, rehab nurse, PT (1-2 hrs/day, 5 days/week) and OT (1-2 hrs/day, 5 days/week) to address physical and functional deficits in the context of the above medical diagnosis(es)? Yes  Addressing deficits in the following areas: balance, endurance, locomotion, strength, transferring, toileting and psychosocial support 5. Can the patient actively participate in an intensive therapy program of at least 3 hrs of therapy per day at least 5 days per week? Yes 6. The potential for patient to make measurable gains while on inpatient rehab is good 7. Anticipated functional outcomes upon discharge from inpatient rehab are independent and modified independent with PT, independent and modified independent with OT, n/a with SLP. 8. Does the patient have adequate social supports and living environment to accommodate these discharge functional goals? Yes 9. Anticipated D/C setting: Home 10. Anticipated post D/C treatments: HH therapy and Home excercise program 11. Overall Rehab/Functional Prognosis: good  RECOMMENDATIONS: This patient's condition is appropriate for continued rehabilitative care in the following setting: CIR after completion of medical workup (Echo)  Patient has agreed to participate in recommended program. Yes Note that insurance prior authorization may be required for reimbursement for recommended care.  Comment: Rehab Admissions Coordinator to follow up.  Delice Lesch, MD 09/22/2015       Revision History     Date/Time User Provider Type Action   09/22/2015 5:46 PM Ankit Lorie Phenix, MD Physician Sign   09/22/2015 3:07 PM Cathlyn Parsons, PA-C Physician Assistant Pend   View Details Report       Routing History     Date/Time From To Method   09/22/2015 5:46 PM Ankit Lorie Phenix, MD Berkley Harvey, NP Fax

## 2015-09-23 NOTE — H&P (Signed)
Physical Medicine and Rehabilitation Admission H&P   Chief complaint: Headache  HPI:  HPI: [name] is a 60 y.o. right handed male with history of hypertension, migraine headaches. Per chart review patient lives alone independently prior to admission. One level home 14 steps to entry. Patient works at [employer]. Presented with numerous ED evaluations for headaches. Placed on Depakote for headache prevention. Admitted 09/14/2015 with facial numbness and headache. MRI of the brain showed no acute intracranial process. 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 No emergent large vessel occlusion. 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 Underwent pipeline stenting 09/20/2015 per interventional radiology. EEG showed no seizure.. 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 Echocardiogram with ejection fraction of 65% no wall motion abnormalities. Neurology follow-up currently continues on aspirin and Plavix therapy. Maintain on a regular diet. Physical and occupational therapy evaluations completed with recommendations of physical medicine rehabilitation consult. Patient was admitted for a comprehensive rehabilitation program  ROS Constitutional: Negative for fever and chills.  HENT: Hearing loss.  Eyes: Positive for blurred vision. Negative for double vision.  Respiratory: Negative for cough and shortness of breath.  Cardiovascular: Negative for chest pain.  Gastrointestinal: Positive for constipation.  Genitourinary: Positive for urgency.  Musculoskeletal: Positive for myalgias.  Skin: Negative for rash.  Neurological: Positive for focal weakness and headaches. Negative for sensory change and seizures.  Psychiatric/Behavioral: Positive for depression. The patient has insomnia.  All other systems reviewed and are  negative   Past Medical History  Diagnosis Date  . Chest pain, atypical     12/2003: negative cardiolyte  . HTN (hypertension)   . HLD (hyperlipidemia)   . Depression   . Hx of tear of ACL (anterior cruciate ligament)     right  . History of meniscal tear     bilateral  . Erectile dysfunction   . Insomnia   . Eczema   . Olecranon bursitis of left elbow 10/2009    s/p I&D by Dr Maxie Better, initially assessed by Dr. Nori Riis   . Allergy   . Anxiety   . Cataract   . Prediabetes   . TIA (transient ischemic attack)    Past Surgical History  Procedure Laterality Date  . Skin tag removal      11 removed  . Knee arthroscopy    . Incise and drain abcess      L elbow due to cellulitis/bursitis  . Inner ear surgery    . Radiology with anesthesia N/A 09/20/2015    Procedure: EMBOLIZATION (RADIOLOGY WITH ANESTHESIA); Surgeon: Luanne Bras, MD; Location: [place]; Service: Radiology; Laterality: N/A;   Family History  Problem Relation Age of Onset  . Colon cancer Neg Hx   . Rectal cancer Neg Hx   . Stomach cancer Neg Hx   . Stroke Mother   . Hypertension Mother   . Aneurysm Mother 78    Died of brain aneursym  . Heart failure Father   . Emphysema Father   . Diabetes Mellitus II Sister    Social History:  reports that he has never smoked. He has never used smokeless tobacco. He reports that he does not drink alcohol or use illicit drugs. Allergies: No Known Allergies Medications Prior to Admission  Medication Sig Dispense Refill  . amLODipine (NORVASC) 10 MG tablet Take 1 tablet (10 mg total) by mouth  daily. 30 tablet 0  . aspirin EC 325 MG EC tablet Take 1 tablet (325 mg total) by mouth daily. 30 tablet 0  . atorvastatin (LIPITOR) 10 MG tablet Take 10 mg by mouth daily at 6 PM.     . baclofen (LIORESAL) 10 MG tablet  Take 1 tablet (10 mg total) by mouth at bedtime. 30 each 0  . chlorthalidone (HYGROTON) 25 MG tablet Take 25 mg by mouth daily.    . clopidogrel (PLAVIX) 75 MG tablet Take 1 tablet (75 mg total) by mouth daily. 30 tablet 0  . divalproex (DEPAKOTE) 500 MG DR tablet Take 1 tablet (500 mg total) by mouth every 12 (twelve) hours. 60 tablet 0  . escitalopram (LEXAPRO) 10 MG tablet Take 10 mg by mouth daily.     Marland Kitchen lisinopril (PRINIVIL,ZESTRIL) 20 MG tablet Take 2 tablets (40 mg total) by mouth every morning. 60 tablet 0  . metoprolol (LOPRESSOR) 50 MG tablet Take 2 tablets (100 mg total) by mouth 2 (two) times daily. 120 tablet 0  . fluticasone (FLONASE) 50 MCG/ACT nasal spray Place 2 sprays into the nose daily. (Patient taking differently: Place 2 sprays into the nose daily as needed for allergies. 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 pt normally sleeps during the day and awake at night so current hospital routine has caused some sleep pattern changes for the patient  Functional History: Prior Function Level of Independence: Independent  Functional Status:  Mobility: Bed Mobility Overal bed mobility: Needs Assistance Bed Mobility: Supine to Sit, Sit to Supine Supine to sit: Mod assist Sit to supine: Independent General bed mobility comments: reaching for therapist for (A) Transfers Overall transfer level: Needs assistance Equipment used: None Transfers: Sit to/from  Stand Sit to Stand: Min guard General transfer comment: some instability in coming Ambulation/Gait Ambulation/Gait assistance: Min guard, Min assist Ambulation Distance (Feet): 110 Feet Assistive device: None Gait Pattern/deviations: Step-through pattern, Drifts right/left General Gait Details: increased instability noted compared to previous session, additionally, patient with poor spatial awareness to the left,bumping into objects, with multiple balance checks requring min assist, one episode of increased moderate assist for stability.  Gait velocity: decreased Gait velocity interpretation: Below normal speed for age/gender    ADL: ADL Overall ADL's : Needs assistance/impaired Eating/Feeding Details (indicate cue type and reason): declined food at this time Grooming: Wash/dry hands, Min guard, Standing Grooming Details (indicate cue type and reason): pt was unable to get paper towel on L side and had to reach with R UE to get paper towel after 3 failed attempts. Toilet Transfer: Minimal assistance, Ambulation, Regular Toilet Toilet Transfer Details (indicate cue type and reason): pt static standing to void bladder. Pt noted to have blood in urine. pt reports urgency and inability to hold urine at this time. pt states "when i have to go i have to go now" Toileting- Water quality scientist and Hygiene: Minimal assistance (standing) Functional mobility during ADLs: Minimal assistance General ADL Comments: pt bumping into objects on L side and extending UE to help feel for environmental objects. pt completed moca assessement with 23 out 30  score indicating deficits. See visual changes  Cognition: Cognition Overall Cognitive Status: Impaired/Different from baseline Orientation Level: Oriented X4 Cognition Arousal/Alertness: Awake/alert Behavior During Therapy: Impulsive Overall Cognitive Status: Impaired/Different from baseline Area of Impairment: Attention, Safety/judgement,  Awareness Current Attention Level: Selective Safety/Judgement: Decreased awareness of deficits Awareness: Emergent General Comments: MOCA provided. Pt immediately states "i did this test 2 weeks ago. I know the answers" Pt completed with score 23 out 30. Pt does report anxiety. Sister dropped daughter ( niece) oxygen tank prior to patient beginning assessment. Pt told family " you have to get that under control because you just really scared me." pt demonstrates visual deficits with head turns and verbalizations of eye changes.   Physical Exam: Blood pressure 136/74, pulse 59, temperature 98.3 F (36.8 C), temperature source Axillary, resp. rate 21, height _0  (1.727 m), weight 111.698 kg (246 lb 4 oz), SpO2 98 %. Physical Exam Constitutional: He is oriented to person, place, and time. He appears well-developed and well-nourished.  HENT:  Head: Normocephalic and atraumatic. Eyes: Conjunctivae and EOM are normal.  Neck: Normal range of motion. Neck supple. No thyromegaly present.  Cardiovascular: Normal rate and regular rhythm.  Respiratory: Effort normal and breath sounds normal. No respiratory distress.  GI: Soft. Bowel sounds are normal. He exhibits no distension.  Musculoskeletal: He exhibits no edema or tenderness.  Neurological: He is alert and oriented to person, place, and time.  Follows commands. Patient ishard of hearing  Fair awareness of deficits Mild left facial droop. Tongue is midline Sensation intact to light touch DTRs symmetric Motor: RUE/RLE: 5/5 proximal to distal LLE: 5/5 in the hip flexor and extensor ankle dorsal flexor LUE: 5/5 in the deltoid, bicep tricep, 3 minus at the finger extensors and finger flexors as well as hand intrinsics Intact sensation to light touch bilateral upper and lower limbs. He does note a difference in quality of sensation in the left hand compared to the right hand There is no evidence of tactile neglect on double simultaneous  stimulation No evidence of visual neglect with visual confrontation testing Finger to thumb opposition he is able to oppose his thumb to his index middle and ring finger and not his little finger. He does so very slowly Neuro:  Eyes without evidence of nystagmus  Tone is normal without evidence of spasticity   Cranial nerves II- Visual fields are intact to confrontation testing, no blurring of vision III- no evidence of ptosis, upward, downward and medial gaze intact IV- no vertical diplopia or head tilt V- no facial numbness or masseter weakness VI- no pupil abduction weakness VII- no facial droop, good lid closure VII- normal auditory acuity IX- no pharygeal weakness, gag nl X- no pharyngeal weakness, no hoarseness XI- no trap or SCM weakness XII- no glossal weakness   Skin: Skin is warm and dry.  Psychiatric: He has a normal mood and affect. His behavior is normal    Lab Results Last 48 Hours    Results for orders placed or performed during the hospital encounter of 09/20/15 (from the past 48 hour(s))  Heparin level (unfractionated) Status: Abnormal   Collection Time: 09/21/15 3:09 PM  Result Value Ref Range   Heparin Unfractionated 0.12 (L) 0.30 - 0.70 IU/mL    Comment:   IF HEPARIN RESULTS ARE BELOW EXPECTED VALUES, AND PATIENT DOSAGE HAS BEEN CONFIRMED, SUGGEST FOLLOW UP TESTING OF ANTITHROMBIN III LEVELS.   Platelet inhibition p2y12 (Not at [facility]) Status: Abnormal   Collection Time: 09/21/15 3:09 PM  Result Value Ref Range   Platelet Function P2Y12 150 (L) 194 - 418 PRU    Comment:   The literature has shown a direct correlation of PRU values over 230 with higher risks of thrombotic events. Lower PRU values are associated with platelet inhibition.   Heparin level (unfractionated) Status: Abnormal   Collection Time: 09/21/15 8:50 PM  Result Value Ref Range   Heparin Unfractionated 0.11 (L)  0.30 - 0.70 IU/mL    Comment:   IF HEPARIN RESULTS ARE BELOW EXPECTED VALUES, AND PATIENT DOSAGE HAS BEEN CONFIRMED, SUGGEST FOLLOW UP TESTING OF ANTITHROMBIN III LEVELS.   CBC Status: Abnormal   Collection Time: 09/22/15 3:54 AM  Result Value Ref Range   WBC 9.5 4.0 - 10.5 K/uL   RBC 4.31 4.22 - 5.81 MIL/uL   Hemoglobin 12.1 (L) 13.0 - 17.0 g/dL   HCT 37.5 (L) 39.0 - 52.0 %   MCV 87.0 78.0 - 100.0 fL   MCH 28.1 26.0 - 34.0 pg   MCHC 32.3 30.0 - 36.0 g/dL   RDW 13.6 11.5 - 15.5 %   Platelets 274 150 - 400 K/uL  Basic metabolic panel Status: Abnormal   Collection Time: 09/22/15 3:54 AM  Result Value Ref Range   Sodium 140 135 - 145 mmol/L   Potassium 3.8 3.5 - 5.1 mmol/L   Chloride 105 101 - 111 mmol/L   CO2 27 22 - 32 mmol/L   Glucose, Bld 89 65 - 99 mg/dL   BUN 8 6 - 20 mg/dL   Creatinine, Ser 0.88 0.61 - 1.24 mg/dL   Calcium 8.3 (L) 8.9 - 10.3 mg/dL   GFR calc non Af Amer >60 >60 mL/min   GFR calc Af Amer >60 >60 mL/min    Comment: (NOTE) The eGFR has been calculated using the CKD EPI equation. This calculation has not been validated in all clinical situations. eGFR's persistently <60 mL/min signify possible Chronic Kidney Disease.    Anion gap 8 5 - 15  Lipid panel Status: Abnormal   Collection Time: 09/22/15 3:54 AM  Result Value Ref Range   Cholesterol 158 0 - 200 mg/dL   Triglycerides 145 <150 mg/dL   HDL 34 (L) >40 mg/dL   Total CHOL/HDL Ratio 4.6 RATIO   VLDL 29 0 - 40 mg/dL   LDL Cholesterol 95 0 - 99 mg/dL    Comment:   Total Cholesterol/HDL:CHD Risk Coronary Heart Disease Risk Table  Men Women 1/2 Average Risk 3.4 3.3 Average Risk 5.0 4.4 2 X Average Risk 9.6 7.1 3 X Average Risk 23.4 11.0   Use the calculated Patient Ratio above and the  CHD Risk Table to determine the patient's CHD Risk.   ATP III CLASSIFICATION (LDL): <100 mg/dL Optimal 100-129 mg/dL Near or Above  Optimal 130-159 mg/dL Borderline 160-189 mg/dL High >190 mg/dL Very High   Hemoglobin A1c Status: Abnormal   Collection Time: 09/22/15 4:05 AM  Result Value Ref Range   Hgb A1c MFr Bld 6.1 (H) 4.8 - 5.6 %    Comment: (NOTE)  Pre-diabetes: 5.7 - 6.4  Diabetes: >6.4  Glycemic control for adults with diabetes: <7.0    Mean Plasma Glucose 128 mg/dL    Comment: (NOTE) Performed At: [facility] [place], [place] [id number] Lindon Romp MD EM:[id number]       Imaging Results (Last 48 hours)    Mr Brain Wo Contrast  09/21/2015 CLINICAL DATA: Pipeline stent for pericallosal aneurysm diversion. Facial numbness. EXAM: MRI  HEAD WITHOUT CONTRAST TECHNIQUE: Multiplanar, multiecho pulse sequences of the brain and surrounding structures were obtained without intravenous contrast. COMPARISON: MRI 09/15/2015 FINDINGS: Small, 3 mm areas of restricted diffusion in the high right frontal cortex, high right parietal cortex, and posterior right parietal cortex. These are most compatible with small embolic acute infarcts. No other acute infarct Artifact from pipeline stent in the anterior cerebral artery on the left. Ventricle size is normal. Cerebral volume is normal Scattered small hyperintense lesions in the cerebral white matter bilaterally and in the pons most compatible with chronic microvascular ischemia. Mild chronic ischemia in the thalamus bilaterally. No MR evidence of acute subarachnoid hemorrhage. Previous MRI demonstrated susceptibility in the left caudate head which is no longer visualized likely due to prior study being done on 3 tesla equipment in the current study 1.5 tesla equipment. Possible cavernoma. Mucosal edema in the paranasal  sinuses bilaterally. 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 Pipeline stent left anterior cerebral artery. No evidence of acute intracranial hemorrhage. Electronically Signed By: Franchot Gallo M.D. On: 09/21/2015 14:21        Medical Problem List and Plan: 1. Headache, facial and left arm numbness secondary to right MCA aneurysm status pipeline stenting/right MCA embolic infarct 2. DVT Prophylaxis/Anticoagulation: SCDs. Monitor for any signs of DVT 3. Pain Management/migraine headaches: Baclofen 10 mg daily at bedtime, Depakote 500 mg every 12 hours, Fioricet as needed 4. Mood. Lexapro 10 mg daily 5. Neuropsych: This patient is capable of making decisions on his own behalf. 6. Skin/Wound Care: Routine skin checks 7. Fluids/Electrolytes/Nutrition: Routine I&O's with follow-up chemistries 8. Hypertension. Norvasc 10 mg daily, chlorthalidone 25 mg daily, lisinopril 40 mg daily, Lopressor 100 mg twice a day. Monitor with increased mobility 9. Hyperlipidemia. Lipitor 10. Prediabetic. Hemoglobin A1c 6.1. Begin diabetic diet. No current plan to check blood sugars. Follow-up outpatient    Post Admission Physician Evaluation: 1. 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 2. Patient is admitted to receive collaborative, interdisciplinary care between the physiatrist, rehab nursing staff, and therapy team. 3. Patient's level of medical complexity and substantial therapy needs in context of that medical necessity cannot be provided at a lesser intensity of care such as a SNF. 4. Patient has experienced substantial functional loss from his/her baseline which was documented above under the "Functional History" and "Functional Status" headings. Judging by the patient's diagnosis, physical exam, and functional history, the  patient has potential for functional progress which will result in measurable gains while on inpatient rehab. These gains will be of substantial and practical use upon discharge in facilitating mobility and self-care at the household level. 5. Physiatrist will provide 24 hour management of medical needs as well as oversight of the therapy plan/treatment and provide guidance as appropriate regarding the interaction of the two. 6. 24 hour rehab nursing will assist with bladder management, bowel management, safety, skin/wound care, disease management, medication administration, pain management and patient education and help integrate therapy concepts, techniques,education, etc. 7. PT will assess and treat for/with: pre gait, gait training, endurance , safety, equipment, neuromuscular re education. Goals are: Mod I/S. 8. OT will assess and treat for/with: ADLs, Cognitive perceptual skills, Neuromuscular re education, safety, endurance, equipment. Goals are: Mod I/Sup. Therapy may proceed with showering this patient. 9. SLP will assess and treat for/with: Eval cognition, eval left-sided visual attention. Goals are: Modified independent  med management. 10. Case Management and Social Worker will assess and treat for psychological issues and discharge planning. 11. Team conference will be held weekly to assess progress toward goals and to determine barriers to discharge. 12. Patient will receive at least 3 hours of therapy per day at least 5 days per week. 13. ELOS: 7-9d  14. Prognosis: excellent     [name] M.D. [employer] [id number] (Sports Med, Neuromuscular Med) Diplomate Am Board of Electrodiagnostic Med  09/23/2015

## 2015-09-23 NOTE — Progress Notes (Signed)
Inpatient Rehabilitation  I have received insurance authorization for admit to IP Rehab today and now await acute MD clearance prior to admission.  Please call with questions.   Carmelia Roller., CCC/SLP Admission Coordinator  [place]  Cell [phone]

## 2015-09-23 NOTE — Progress Notes (Signed)
[facility] Admission Coordinator Signed Physical Medicine and Rehabilitation PMR Pre-admission 09/23/2015 12:14 PM  Related encounter: Admission (Discharged) from 09/20/2015 in [place] [facility]    Expand All Collapse All   PMR Admission Coordinator Pre-Admission Assessment  Patient: [name] is an 60 y.o., male MRN: [id number] DOB: 1955/12/21 Height: 5\' 8"  (172.7 cm) Weight: 111.698 kg (246 lb 4 oz)  Insurance Information HMO: PPO: X PCP: IPA: 80/20: OTHER:  PRIMARY: [employer] Policy#: [id number] Subscriber: Self CM Name: Alvie Heidelberg for Rayetta Humphrey Phone#: [phone] Fax#: [phone] Pre-Cert#: [id number] Employer: Full Time Benefits: Phone #: [phone] Name: Mitzi Eff. Date: 10/20/15 Deduct: $1500.00 Out of Pocket Max: $6450.00 Life Max: N/A CIR: 80%/20% SNF: 80%/20% Outpatient: OT/PT/SLP 80% Co-Pay: 20% Home Health: 80% Co-Pay: 20% DME: 80% Co-Pay: 20% Providers: in-network  Medicaid Application Date: Case Manager:  Disability Application Date: Case Worker:   Emergency Facilities manager Information    Name Relation Home Work [place] Spouse  [phone]    No name specified       Ahlquist,Rene Daughter   [phone]   Perlstein,[name] Daughter   [phone]     Current Medical History  Patient Admitting Diagnosis: Right MCA aneurysm status post stent   History of Present Illness: [name] is a 60 y.o. right handed male with history of hypertension, migraine headaches. Per chart review patient lives alone independently prior to admission. One level home 14 steps to entry. Patient works at [employer]. Presented with  numerous ED evaluations for headaches. Placed on Depakote for headache prevention. Admitted 09/14/2015 with facial numbness and headache. MRI of the brain showed no acute intracranial process. 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 No emergent large vessel occlusion. 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 Underwent pipeline stenting 09/20/2015 per interventional radiology. EEG showed no seizure. 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 Echocardiogram with ejection fraction of 65% no wall motion abnormalities. Neurology follow-up currently continues on aspirin and Plavix therapy. Maintain on a regular diet. Physical and occupational therapy evaluations completed with recommendations of physical medicine rehabilitation consult. Patient was admitted for a comprehensive rehabilitation program.   NIH Total: 4    Past Medical History  Past Medical History  Diagnosis Date  . Chest pain, atypical     12/2003: negative cardiolyte  . HTN (hypertension)   . HLD (hyperlipidemia)   . Depression   . Hx of tear of ACL (anterior cruciate ligament)     right  . History of meniscal tear     bilateral  . Erectile dysfunction   . Insomnia   . Eczema   . Olecranon bursitis of left elbow 10/2009    s/p I&D by Dr Maxie Better, initially assessed by Dr. Nori Riis   . Allergy   . Anxiety   . Cataract   . Prediabetes   . TIA (transient ischemic attack)     Family History  family history includes Aneurysm (age of onset: 58) in his mother; Diabetes Mellitus II in his sister; Emphysema in his father; Heart failure in his father; Hypertension in his mother; Stroke in his mother. There is no history of Colon cancer, Rectal cancer, or Stomach cancer.  Prior Rehab/Hospitalizations:  Has the patient had major surgery  during 100 days prior to admission? No  Current Medications  Current facility-administered medications:  . 0.9 % sodium chloride infusion, , Intravenous, Continuous, Luanne Bras, MD, Stopped at 09/22/15 1300 . 0.9 % sodium chloride infusion, , Intravenous, Continuous, Luanne Bras, MD, Stopped at 09/20/15 2240 . acetaminophen (TYLENOL) tablet 650 mg, 650 mg, Oral, Q6H PRN, 650 mg at 09/22/15 0211 **OR** [DISCONTINUED] acetaminophen (TYLENOL) suppository 650 mg, 650 mg, Rectal, Q6H PRN, Luanne Bras, MD . amLODipine (NORVASC) tablet 10 mg, 10 mg, Oral, Daily, Monia Sabal, PA-C, 10 mg at 09/23/15 1137 . aspirin EC tablet 325 mg, 325 mg, Oral, Daily, Monia Sabal, PA-C, 325 mg at 09/23/15 1135 . atorvastatin (LIPITOR) tablet 10 mg, 10 mg, Oral, q1800, Monia Sabal, PA-C, 10 mg at 09/22/15 1817 . baclofen (LIORESAL) tablet 10 mg, 10 mg, Oral, QHS, Monia Sabal, PA-C, 10 mg at 09/22/15 2115 . butalbital-acetaminophen-caffeine (FIORICET, ESGIC) 50-325-40 MG per tablet 1 tablet, 1 tablet, Oral, Q8H PRN, Rosalin Hawking, MD, 1 tablet at 09/22/15 1358 . chlorthalidone (HYGROTON) tablet 25 mg, 25 mg, Oral, Daily, Monia Sabal, PA-C, 25 mg at 09/23/15 1136 . clopidogrel (PLAVIX) tablet 75 mg, 75 mg, Oral, Q breakfast, Luanne Bras, MD, 75 mg at 09/23/15 0808 . divalproex (DEPAKOTE) DR tablet 500 mg, 500 mg, Oral, Q12H, Monia Sabal, PA-C, 500 mg at 09/23/15 1136 . escitalopram (LEXAPRO) tablet 10 mg, 10 mg, Oral, Daily, Monia Sabal, PA-C, 10 mg at 09/23/15 1136 . fluticasone (FLONASE) 50 MCG/ACT nasal spray 2 spray, 2 spray, Each Nare, Daily PRN, Monia Sabal, PA-C . lactated ringers infusion, , Intravenous, Continuous, Rica Koyanagi, MD, Last Rate: 10 mL/hr at 09/20/15 2000 . lisinopril (PRINIVIL,ZESTRIL) tablet 40 mg, 40 mg, Oral, q morning - 10a, Monia Sabal, PA-C, 40 mg at 09/23/15 1137 . metoprolol (LOPRESSOR) tablet 100 mg, 100 mg, Oral, BID, Monia Sabal, PA-C, 100 mg at 09/23/15 1137 . nicardipine (CARDENE) 20mg  in 0.86% saline 263ml IV infusion (0.1 mg/ml), 5-15 mg/hr, Intravenous, Continuous, Luanne Bras, MD, Stopped at 09/20/15 2218 . ondansetron (ZOFRAN) injection 4 mg, 4 mg, Intravenous, Q6H PRN, Luanne Bras, MD  Patients Current Diet: Diet Heart Room service appropriate?: Yes; Fluid consistency:: Thin  Precautions / Restrictions Precautions Precautions: Fall Restrictions Weight Bearing Restrictions: No   Has the patient had 2 or more falls or a fall with injury in the past year?No, 1 fall   Prior Activity Level Community (5-7x/wk): Patient was completely independent prior to admission. He worked full time, was on his feet walking all shift and drove.  Home Assistive Devices / Equipment Home Assistive Devices/Equipment: None Home Equipment: None  Prior Device Use: Indicate devices/aids used by the patient prior to current illness, exacerbation or injury? None of the above  Prior Functional Level Prior Function Level of Independence: Independent  Self Care: Did the patient need help bathing, dressing, using the toilet or eating? Independent  Indoor Mobility: Did the patient need assistance with walking from room to room (with or without device)? Independent  Stairs: Did the patient need assistance with internal or external stairs (with or without device)? Independent  Functional Cognition: Did the patient need help planning regular tasks such as shopping or remembering to take medications? Independent  Current Functional Level Cognition  Overall Cognitive Status: Impaired/Different from baseline Current Attention Level: Selective Orientation Level: Oriented X4 Safety/Judgement: Decreased awareness of deficits General Comments: MOCA provided. Pt immediately states "i did this test 2 weeks ago. I know the answers" Pt completed with score 23 out 30. Pt does report anxiety. Sister dropped daughter (  niece) oxygen tank prior  to patient beginning assessment. Pt told family " you have to get that under control because you just really scared me." pt demonstrates visual deficits with head turns and verbalizations of eye changes.    Extremity Assessment (includes Sensation/Coordination)  Upper Extremity Assessment: LUE deficits/detail LUE Deficits / Details: numbness tingling and decr gross grasp  Lower Extremity Assessment: Defer to PT evaluation    ADLs  Overall ADL's : Needs assistance/impaired Eating/Feeding Details (indicate cue type and reason): declined food at this time Grooming: Wash/dry hands, Min guard, Standing Grooming Details (indicate cue type and reason): pt was unable to get paper towel on L side and had to reach with R UE to get paper towel after 3 failed attempts. Toilet Transfer: Minimal assistance, Ambulation, Regular Toilet Toilet Transfer Details (indicate cue type and reason): pt static standing to void bladder. Pt noted to have blood in urine. pt reports urgency and inability to hold urine at this time. pt states "when i have to go i have to go now" Toileting- Water quality scientist and Hygiene: Minimal assistance (standing) Functional mobility during ADLs: Minimal assistance General ADL Comments: pt bumping into objects on L side and extending UE to help feel for environmental objects. pt completed moca assessement with 23 out 30 score indicating deficits. See visual changes    Mobility  Overal bed mobility: Needs Assistance Bed Mobility: Supine to Sit, Sit to Supine Supine to sit: Mod assist Sit to supine: Independent General bed mobility comments: reaching for therapist for (A)    Transfers  Overall transfer level: Needs assistance Equipment used: None Transfers: Sit to/from Stand Sit to Stand: Min guard General transfer comment: some instability in coming    Ambulation / Gait / Stairs / Emergency planning/management officer  Ambulation/Gait Ambulation/Gait  assistance: Min guard, Min assist Ambulation Distance (Feet): 110 Feet Assistive device: None Gait Pattern/deviations: Step-through pattern, Drifts right/left General Gait Details: increased instability noted compared to previous session, additionally, patient with poor spatial awareness to the left,bumping into objects, with multiple balance checks requring min assist, one episode of increased moderate assist for stability.  Gait velocity: decreased Gait velocity interpretation: Below normal speed for age/gender    Posture / Balance Balance Overall balance assessment: Needs assistance Sitting-balance support: Feet supported Sitting balance-Leahy Scale: Fair Standing balance support: During functional activity Standing balance-Leahy Scale: Fair Standing balance comment: able to perform static standing activities with increased lateral sway High level balance activites: Backward walking, Direction changes, Turns, Sudden stops, Head turns High Level Balance Comments: min to moderate assist performing higher level balance tasks    Special needs/care consideration BiPAP/CPAP: No CPM: No Continuous Drip IV: No Dialysis: No  Life Vest: No Oxygen: No Special Bed: No Trach Size: No Wound Vac (area): No  Skin: WDL  Bowel mgmt: 09/19/15 Bladder mgmt: frequency with urgency as a result intermittent incontinency  Diabetic mgmt: N/A     Previous Home Environment Living Arrangements: Alone Available Help at Discharge: Friend(s), Available PRN/intermittently Type of Home: Apartment Home Layout: One level Home Access: Stairs to enter Entrance Stairs-Rails: Left, Right, Can reach both Entrance Stairs-Number of Steps: 14 Bathroom Shower/Tub: Gaffer, Charity fundraiser: Standard Home Care Services: No Additional Comments: Pt works at [employer] 3rd shift. pt normally sleeps during the day and awake at  night so current hospital routine has caused some sleep pattern changes for the patient  Discharge Living Setting Plans for Discharge Living Setting: Patient's home Type of Home at Discharge: Other (Comment) (Condo) Discharge  Home Layout: One level Discharge Home Access: Stairs to enter Entrance Stairs-Rails: Can reach both Entrance Stairs-Number of Steps: 3 with a landing then 14 Discharge Bathroom Shower/Tub: Tub/shower unit, Curtain Discharge Bathroom Toilet: Standard Discharge Bathroom Accessibility: Yes How Accessible: Accessible via walker Does the patient have any problems obtaining your medications?: No  Social/Family/Support Systems Patient Roles: Partner, Other (Comment) (divorced) Anticipated Caregiver: daughters Debroah Baller (local available at night) [phone] Anticipated Caregiver's Contact Information: [name] (lives in [place]) [phone] Ability/Limitations of Caregiver: both daughters work and can assist intermittently  Caregiver Availability: Intermittent Discharge Plan Discussed with Primary Caregiver: No (discussed with patient and ex-wife; patient with Mod I goals) Is Caregiver In Agreement with Plan?: (N/A) Does Caregiver/Family have Issues with Lodging/Transportation while Pt is in Rehab?: No  Goals/Additional Needs Patient/Family Goal for Rehab: PT/OT/SLP Mod I-Independent  Expected length of stay: 5-7 days Cultural Considerations: Christian  Dietary Needs: None Equipment Needs: TBD Special Service Needs: None Additional Information: None Pt/Family Agrees to Admission and willing to participate: Yes Program Orientation Provided & Reviewed with Pt/Caregiver Including Roles & Responsibilities: Yes Additional Information Needs: N/A Information Needs to be Provided By: N/A  Decrease burden of Care through IP rehab admission: No  Possible need for SNF placement upon discharge: No  Patient Condition: This patient's condition remains as documented in the  consult dated 09/22/15, in which the Rehabilitation Physician determined and documented that the patient's condition is appropriate for intensive rehabilitative care in an inpatient rehabilitation facility. Will admit to inpatient rehab today.  Preadmission Screen Completed By: Gunnar Fusi, 09/23/2015 12:23 PM ______________________________________________________________________  Discussed status with Dr. Letta Pate on 09/23/15 at 1235 and received telephone approval for admission today.  Admission Coordinator: Gunnar Fusi, time 1235/Date 09/23/15          Cosigned by: [name], MD at 09/23/2015 1:05 PM  Revision History     Date/Time User Provider Type Action   09/23/2015 1:05 PM [name], MD Physician Cosign   09/23/2015 12:36 PM Gunnar Fusi Rehab Admission Coordinator Sign

## 2015-09-23 NOTE — H&P (View-Only) (Signed)
Physical Medicine and Rehabilitation Admission H&P   Chief complaint: Headache  HPI:  HPI: [name] is a 60 y.o. right handed male with history of hypertension, migraine headaches. Per chart review patient lives alone independently prior to admission. One level home 14 steps to entry. Patient works at [employer]. Presented with numerous ED evaluations for headaches. Placed on Depakote for headache prevention. Admitted 09/14/2015 with facial numbness and headache. MRI of the brain showed no acute intracranial process. 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 No emergent large vessel occlusion. 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 Underwent pipeline stenting 09/20/2015 per interventional radiology. EEG showed no seizure.. 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 Echocardiogram with ejection fraction of 65% no wall motion abnormalities. Neurology follow-up currently continues on aspirin and Plavix therapy. Maintain on a regular diet. Physical and occupational therapy evaluations completed with recommendations of physical medicine rehabilitation consult. Patient was admitted for a comprehensive rehabilitation program  ROS Constitutional: Negative for fever and chills.  HENT: Hearing loss.  Eyes: Positive for blurred vision. Negative for double vision.  Respiratory: Negative for cough and shortness of breath.  Cardiovascular: Negative for chest pain.  Gastrointestinal: Positive for constipation.  Genitourinary: Positive for urgency.  Musculoskeletal: Positive for myalgias.  Skin: Negative for rash.  Neurological: Positive for focal weakness and headaches. Negative for sensory change and seizures.  Psychiatric/Behavioral: Positive for depression. The patient has insomnia.  All other systems reviewed and are  negative   Past Medical History  Diagnosis Date  . Chest pain, atypical     12/2003: negative cardiolyte  . HTN (hypertension)   . HLD (hyperlipidemia)   . Depression   . Hx of tear of ACL (anterior cruciate ligament)     right  . History of meniscal tear     bilateral  . Erectile dysfunction   . Insomnia   . Eczema   . Olecranon bursitis of left elbow 10/2009    s/p I&D by Dr Maxie Better, initially assessed by Dr. Nori Riis   . Allergy   . Anxiety   . Cataract   . Prediabetes   . TIA (transient ischemic attack)    Past Surgical History  Procedure Laterality Date  . Skin tag removal      11 removed  . Knee arthroscopy    . Incise and drain abcess      L elbow due to cellulitis/bursitis  . Inner ear surgery    . Radiology with anesthesia N/A 09/20/2015    Procedure: EMBOLIZATION (RADIOLOGY WITH ANESTHESIA); Surgeon: Luanne Bras, MD; Location: [place]; Service: Radiology; Laterality: N/A;   Family History  Problem Relation Age of Onset  . Colon cancer Neg Hx   . Rectal cancer Neg Hx   . Stomach cancer Neg Hx   . Stroke Mother   . Hypertension Mother   . Aneurysm Mother 78    Died of brain aneursym  . Heart failure Father   . Emphysema Father   . Diabetes Mellitus II Sister    Social History:  reports that he has never smoked. He has never used smokeless tobacco. He reports that he does not drink alcohol or use illicit drugs. Allergies: No Known Allergies Medications Prior to Admission  Medication Sig Dispense Refill  . amLODipine (NORVASC) 10 MG tablet Take 1 tablet (10 mg total) by mouth  daily. 30 tablet 0  . aspirin EC 325 MG EC tablet Take 1 tablet (325 mg total) by mouth daily. 30 tablet 0  . atorvastatin (LIPITOR) 10 MG tablet Take 10 mg by mouth daily at 6 PM.     . baclofen (LIORESAL) 10 MG tablet  Take 1 tablet (10 mg total) by mouth at bedtime. 30 each 0  . chlorthalidone (HYGROTON) 25 MG tablet Take 25 mg by mouth daily.    . clopidogrel (PLAVIX) 75 MG tablet Take 1 tablet (75 mg total) by mouth daily. 30 tablet 0  . divalproex (DEPAKOTE) 500 MG DR tablet Take 1 tablet (500 mg total) by mouth every 12 (twelve) hours. 60 tablet 0  . escitalopram (LEXAPRO) 10 MG tablet Take 10 mg by mouth daily.     Marland Kitchen lisinopril (PRINIVIL,ZESTRIL) 20 MG tablet Take 2 tablets (40 mg total) by mouth every morning. 60 tablet 0  . metoprolol (LOPRESSOR) 50 MG tablet Take 2 tablets (100 mg total) by mouth 2 (two) times daily. 120 tablet 0  . fluticasone (FLONASE) 50 MCG/ACT nasal spray Place 2 sprays into the nose daily. (Patient taking differently: Place 2 sprays into the nose daily as needed for allergies. 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 pt normally sleeps during the day and awake at night so current hospital routine has caused some sleep pattern changes for the patient  Functional History: Prior Function Level of Independence: Independent  Functional Status:  Mobility: Bed Mobility Overal bed mobility: Needs Assistance Bed Mobility: Supine to Sit, Sit to Supine Supine to sit: Mod assist Sit to supine: Independent General bed mobility comments: reaching for therapist for (A) Transfers Overall transfer level: Needs assistance Equipment used: None Transfers: Sit to/from  Stand Sit to Stand: Min guard General transfer comment: some instability in coming Ambulation/Gait Ambulation/Gait assistance: Min guard, Min assist Ambulation Distance (Feet): 110 Feet Assistive device: None Gait Pattern/deviations: Step-through pattern, Drifts right/left General Gait Details: increased instability noted compared to previous session, additionally, patient with poor spatial awareness to the left,bumping into objects, with multiple balance checks requring min assist, one episode of increased moderate assist for stability.  Gait velocity: decreased Gait velocity interpretation: Below normal speed for age/gender    ADL: ADL Overall ADL's : Needs assistance/impaired Eating/Feeding Details (indicate cue type and reason): declined food at this time Grooming: Wash/dry hands, Min guard, Standing Grooming Details (indicate cue type and reason): pt was unable to get paper towel on L side and had to reach with R UE to get paper towel after 3 failed attempts. Toilet Transfer: Minimal assistance, Ambulation, Regular Toilet Toilet Transfer Details (indicate cue type and reason): pt static standing to void bladder. Pt noted to have blood in urine. pt reports urgency and inability to hold urine at this time. pt states "when i have to go i have to go now" Toileting- Water quality scientist and Hygiene: Minimal assistance (standing) Functional mobility during ADLs: Minimal assistance General ADL Comments: pt bumping into objects on L side and extending UE to help feel for environmental objects. pt completed moca assessement with 23 out 30  score indicating deficits. See visual changes  Cognition: Cognition Overall Cognitive Status: Impaired/Different from baseline Orientation Level: Oriented X4 Cognition Arousal/Alertness: Awake/alert Behavior During Therapy: Impulsive Overall Cognitive Status: Impaired/Different from baseline Area of Impairment: Attention, Safety/judgement,  Awareness Current Attention Level: Selective Safety/Judgement: Decreased awareness of deficits Awareness: Emergent General Comments: MOCA provided. Pt immediately states "i did this test 2 weeks ago. I know the answers" Pt completed with score 23 out 30. Pt does report anxiety. Sister dropped daughter ( niece) oxygen tank prior to patient beginning assessment. Pt told family " you have to get that under control because you just really scared me." pt demonstrates visual deficits with head turns and verbalizations of eye changes.   Physical Exam: Blood pressure 136/74, pulse 59, temperature 98.3 F (36.8 C), temperature source Axillary, resp. rate 21, height _0  (1.727 m), weight 111.698 kg (246 lb 4 oz), SpO2 98 %. Physical Exam Constitutional: He is oriented to person, place, and time. He appears well-developed and well-nourished.  HENT:  Head: Normocephalic and atraumatic. Eyes: Conjunctivae and EOM are normal.  Neck: Normal range of motion. Neck supple. No thyromegaly present.  Cardiovascular: Normal rate and regular rhythm.  Respiratory: Effort normal and breath sounds normal. No respiratory distress.  GI: Soft. Bowel sounds are normal. He exhibits no distension.  Musculoskeletal: He exhibits no edema or tenderness.  Neurological: He is alert and oriented to person, place, and time.  Follows commands. Patient ishard of hearing  Fair awareness of deficits Mild left facial droop. Tongue is midline Sensation intact to light touch DTRs symmetric Motor: RUE/RLE: 5/5 proximal to distal LLE: 5/5 in the hip flexor and extensor ankle dorsal flexor LUE: 5/5 in the deltoid, bicep tricep, 3 minus at the finger extensors and finger flexors as well as hand intrinsics Intact sensation to light touch bilateral upper and lower limbs. He does note a difference in quality of sensation in the left hand compared to the right hand There is no evidence of tactile neglect on double simultaneous  stimulation No evidence of visual neglect with visual confrontation testing Finger to thumb opposition he is able to oppose his thumb to his index middle and ring finger and not his little finger. He does so very slowly Neuro:  Eyes without evidence of nystagmus  Tone is normal without evidence of spasticity   Cranial nerves II- Visual fields are intact to confrontation testing, no blurring of vision III- no evidence of ptosis, upward, downward and medial gaze intact IV- no vertical diplopia or head tilt V- no facial numbness or masseter weakness VI- no pupil abduction weakness VII- no facial droop, good lid closure VII- normal auditory acuity IX- no pharygeal weakness, gag nl X- no pharyngeal weakness, no hoarseness XI- no trap or SCM weakness XII- no glossal weakness   Skin: Skin is warm and dry.  Psychiatric: He has a normal mood and affect. His behavior is normal    Lab Results Last 48 Hours    Results for orders placed or performed during the hospital encounter of 09/20/15 (from the past 48 hour(s))  Heparin level (unfractionated) Status: Abnormal   Collection Time: 09/21/15 3:09 PM  Result Value Ref Range   Heparin Unfractionated 0.12 (L) 0.30 - 0.70 IU/mL    Comment:   IF HEPARIN RESULTS ARE BELOW EXPECTED VALUES, AND PATIENT DOSAGE HAS BEEN CONFIRMED, SUGGEST FOLLOW UP TESTING OF ANTITHROMBIN III LEVELS.   Platelet inhibition p2y12 (Not at [facility]) Status: Abnormal   Collection Time: 09/21/15 3:09 PM  Result Value Ref Range   Platelet Function P2Y12 150 (L) 194 - 418 PRU    Comment:   The literature has shown a direct correlation of PRU values over 230 with higher risks of thrombotic events. Lower PRU values are associated with platelet inhibition.   Heparin level (unfractionated) Status: Abnormal   Collection Time: 09/21/15 8:50 PM  Result Value Ref Range   Heparin Unfractionated 0.11 (L)  0.30 - 0.70 IU/mL    Comment:   IF HEPARIN RESULTS ARE BELOW EXPECTED VALUES, AND PATIENT DOSAGE HAS BEEN CONFIRMED, SUGGEST FOLLOW UP TESTING OF ANTITHROMBIN III LEVELS.   CBC Status: Abnormal   Collection Time: 09/22/15 3:54 AM  Result Value Ref Range   WBC 9.5 4.0 - 10.5 K/uL   RBC 4.31 4.22 - 5.81 MIL/uL   Hemoglobin 12.1 (L) 13.0 - 17.0 g/dL   HCT 37.5 (L) 39.0 - 52.0 %   MCV 87.0 78.0 - 100.0 fL   MCH 28.1 26.0 - 34.0 pg   MCHC 32.3 30.0 - 36.0 g/dL   RDW 13.6 11.5 - 15.5 %   Platelets 274 150 - 400 K/uL  Basic metabolic panel Status: Abnormal   Collection Time: 09/22/15 3:54 AM  Result Value Ref Range   Sodium 140 135 - 145 mmol/L   Potassium 3.8 3.5 - 5.1 mmol/L   Chloride 105 101 - 111 mmol/L   CO2 27 22 - 32 mmol/L   Glucose, Bld 89 65 - 99 mg/dL   BUN 8 6 - 20 mg/dL   Creatinine, Ser 0.88 0.61 - 1.24 mg/dL   Calcium 8.3 (L) 8.9 - 10.3 mg/dL   GFR calc non Af Amer >60 >60 mL/min   GFR calc Af Amer >60 >60 mL/min    Comment: (NOTE) The eGFR has been calculated using the CKD EPI equation. This calculation has not been validated in all clinical situations. eGFR's persistently <60 mL/min signify possible Chronic Kidney Disease.    Anion gap 8 5 - 15  Lipid panel Status: Abnormal   Collection Time: 09/22/15 3:54 AM  Result Value Ref Range   Cholesterol 158 0 - 200 mg/dL   Triglycerides 145 <150 mg/dL   HDL 34 (L) >40 mg/dL   Total CHOL/HDL Ratio 4.6 RATIO   VLDL 29 0 - 40 mg/dL   LDL Cholesterol 95 0 - 99 mg/dL    Comment:   Total Cholesterol/HDL:CHD Risk Coronary Heart Disease Risk Table  Men Women 1/2 Average Risk 3.4 3.3 Average Risk 5.0 4.4 2 X Average Risk 9.6 7.1 3 X Average Risk 23.4 11.0   Use the calculated Patient Ratio above and the  CHD Risk Table to determine the patient's CHD Risk.   ATP III CLASSIFICATION (LDL): <100 mg/dL Optimal 100-129 mg/dL Near or Above  Optimal 130-159 mg/dL Borderline 160-189 mg/dL High >190 mg/dL Very High   Hemoglobin A1c Status: Abnormal   Collection Time: 09/22/15 4:05 AM  Result Value Ref Range   Hgb A1c MFr Bld 6.1 (H) 4.8 - 5.6 %    Comment: (NOTE)  Pre-diabetes: 5.7 - 6.4  Diabetes: >6.4  Glycemic control for adults with diabetes: <7.0    Mean Plasma Glucose 128 mg/dL    Comment: (NOTE) Performed At: [facility] [place], [place] [id number] Lindon Romp MD EM:[id number]       Imaging Results (Last 48 hours)    Mr Brain Wo Contrast  09/21/2015 CLINICAL DATA: Pipeline stent for pericallosal aneurysm diversion. Facial numbness. EXAM: MRI  HEAD WITHOUT CONTRAST TECHNIQUE: Multiplanar, multiecho pulse sequences of the brain and surrounding structures were obtained without intravenous contrast. COMPARISON: MRI 09/15/2015 FINDINGS: Small, 3 mm areas of restricted diffusion in the high right frontal cortex, high right parietal cortex, and posterior right parietal cortex. These are most compatible with small embolic acute infarcts. No other acute infarct Artifact from pipeline stent in the anterior cerebral artery on the left. Ventricle size is normal. Cerebral volume is normal Scattered small hyperintense lesions in the cerebral white matter bilaterally and in the pons most compatible with chronic microvascular ischemia. Mild chronic ischemia in the thalamus bilaterally. No MR evidence of acute subarachnoid hemorrhage. Previous MRI demonstrated susceptibility in the left caudate head which is no longer visualized likely due to prior study being done on 3 tesla equipment in the current study 1.5 tesla equipment. Possible cavernoma. Mucosal edema in the paranasal  sinuses bilaterally. 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 Pipeline stent left anterior cerebral artery. No evidence of acute intracranial hemorrhage. Electronically Signed By: Franchot Gallo M.D. On: 09/21/2015 14:21        Medical Problem List and Plan: 1. Headache, facial and left arm numbness secondary to right MCA aneurysm status pipeline stenting/right MCA embolic infarct 2. DVT Prophylaxis/Anticoagulation: SCDs. Monitor for any signs of DVT 3. Pain Management/migraine headaches: Baclofen 10 mg daily at bedtime, Depakote 500 mg every 12 hours, Fioricet as needed 4. Mood. Lexapro 10 mg daily 5. Neuropsych: This patient is capable of making decisions on his own behalf. 6. Skin/Wound Care: Routine skin checks 7. Fluids/Electrolytes/Nutrition: Routine I&O's with follow-up chemistries 8. Hypertension. Norvasc 10 mg daily, chlorthalidone 25 mg daily, lisinopril 40 mg daily, Lopressor 100 mg twice a day. Monitor with increased mobility 9. Hyperlipidemia. Lipitor 10. Prediabetic. Hemoglobin A1c 6.1. Begin diabetic diet. No current plan to check blood sugars. Follow-up outpatient    Post Admission Physician Evaluation: 1. 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 2. Patient is admitted to receive collaborative, interdisciplinary care between the physiatrist, rehab nursing staff, and therapy team. 3. Patient's level of medical complexity and substantial therapy needs in context of that medical necessity cannot be provided at a lesser intensity of care such as a SNF. 4. Patient has experienced substantial functional loss from his/her baseline which was documented above under the "Functional History" and "Functional Status" headings. Judging by the patient's diagnosis, physical exam, and functional history, the  patient has potential for functional progress which will result in measurable gains while on inpatient rehab. These gains will be of substantial and practical use upon discharge in facilitating mobility and self-care at the household level. 5. Physiatrist will provide 24 hour management of medical needs as well as oversight of the therapy plan/treatment and provide guidance as appropriate regarding the interaction of the two. 6. 24 hour rehab nursing will assist with bladder management, bowel management, safety, skin/wound care, disease management, medication administration, pain management and patient education and help integrate therapy concepts, techniques,education, etc. 7. PT will assess and treat for/with: pre gait, gait training, endurance , safety, equipment, neuromuscular re education. Goals are: Mod I/S. 8. OT will assess and treat for/with: ADLs, Cognitive perceptual skills, Neuromuscular re education, safety, endurance, equipment. Goals are: Mod I/Sup. Therapy may proceed with showering this patient. 9. SLP will assess and treat for/with: Eval cognition, eval left-sided visual attention. Goals are: Modified independent  med management. 10. Case Management and Social Worker will assess and treat for psychological issues and discharge planning. 11. Team conference will be held weekly to assess progress toward goals and to determine barriers to discharge. 12. Patient will receive at least 3 hours of therapy per day at least 5 days per week. 13. ELOS: 7-9d  14. Prognosis: excellent     [name] M.D. [employer] [facility] [id number] (Sports Med, Neuromuscular Med) Diplomate Am Board of Electrodiagnostic Med  09/23/2015

## 2015-09-23 NOTE — Progress Notes (Signed)
Inpatient Rehabilitation  Met with patient and ex-wife at bedside to discuss team's recommendations for IP Rehab post his acute care stay.  Patient verbalized desire to work with intensive rehab so that he can access his second floor apartment upon discharge home.  I have initiated insurance authorization for admission today.  Will follow for MD clearance and insurance authorization.  Please call with questions.   Carmelia Roller., CCC/SLP Admission Coordinator  [facility]  Cell [phone]

## 2015-09-24 ENCOUNTER — Inpatient Hospital Stay (HOSPITAL_COMMUNITY): Payer: Managed Care, Other (non HMO) | Admitting: Physical Therapy

## 2015-09-24 ENCOUNTER — Inpatient Hospital Stay (HOSPITAL_COMMUNITY): Payer: Managed Care, Other (non HMO)

## 2015-09-24 DIAGNOSIS — I671 Cerebral aneurysm, nonruptured: Secondary | ICD-10-CM

## 2015-09-24 DIAGNOSIS — F411 Generalized anxiety disorder: Secondary | ICD-10-CM | POA: Insufficient documentation

## 2015-09-24 DIAGNOSIS — G43109 Migraine with aura, not intractable, without status migrainosus: Secondary | ICD-10-CM | POA: Insufficient documentation

## 2015-09-24 DIAGNOSIS — R269 Unspecified abnormalities of gait and mobility: Secondary | ICD-10-CM

## 2015-09-24 DIAGNOSIS — I63411 Cerebral infarction due to embolism of right middle cerebral artery: Principal | ICD-10-CM

## 2015-09-24 DIAGNOSIS — I69398 Other sequelae of cerebral infarction: Secondary | ICD-10-CM

## 2015-09-24 LAB — CBC WITH DIFFERENTIAL/PLATELET
BASOS ABS: 0.1 10*3/uL (ref 0.0–0.1)
Basophils Relative: 1 %
Eosinophils Absolute: 0.6 10*3/uL (ref 0.0–0.7)
Eosinophils Relative: 6 %
HEMATOCRIT: 40.7 % (ref 39.0–52.0)
HEMOGLOBIN: 13.5 g/dL (ref 13.0–17.0)
LYMPHS PCT: 19 %
Lymphs Abs: 1.9 10*3/uL (ref 0.7–4.0)
MCH: 28.7 pg (ref 26.0–34.0)
MCHC: 33.2 g/dL (ref 30.0–36.0)
MCV: 86.4 fL (ref 78.0–100.0)
Monocytes Absolute: 0.9 10*3/uL (ref 0.1–1.0)
Monocytes Relative: 9 %
NEUTROS ABS: 6.5 10*3/uL (ref 1.7–7.7)
NEUTROS PCT: 65 %
Platelets: 317 10*3/uL (ref 150–400)
RBC: 4.71 MIL/uL (ref 4.22–5.81)
RDW: 13.6 % (ref 11.5–15.5)
WBC: 10 10*3/uL (ref 4.0–10.5)

## 2015-09-24 LAB — COMPREHENSIVE METABOLIC PANEL
ALT: 36 U/L (ref 17–63)
AST: 21 U/L (ref 15–41)
Albumin: 3.3 g/dL — ABNORMAL LOW (ref 3.5–5.0)
Alkaline Phosphatase: 66 U/L (ref 38–126)
Anion gap: 13 (ref 5–15)
BILIRUBIN TOTAL: 0.7 mg/dL (ref 0.3–1.2)
BUN: 15 mg/dL (ref 6–20)
CO2: 25 mmol/L (ref 22–32)
CREATININE: 1.02 mg/dL (ref 0.61–1.24)
Calcium: 8.9 mg/dL (ref 8.9–10.3)
Chloride: 101 mmol/L (ref 101–111)
GFR calc Af Amer: >60 mL/min (ref >60)
GLUCOSE: 83 mg/dL (ref 65–99)
Potassium: 3.5 mmol/L (ref 3.5–5.1)
Sodium: 139 mmol/L (ref 135–145)
TOTAL PROTEIN: 6.6 g/dL (ref 6.5–8.1)

## 2015-09-24 LAB — VALPROIC ACID LEVEL: VALPROIC ACID LVL: 86 ug/mL (ref 50.0–100.0)

## 2015-09-24 NOTE — Progress Notes (Signed)
Patient vomited x 2 today small amounts of saliva no consistency noted. Both episodes of vomiting after completing physical therapy. Patient refused prn zofran. Patient reports he has had episodes of motion sickness before today. Continue to monitor. Patient resting at this time.  Mliss Sax

## 2015-09-24 NOTE — Care Management Note (Signed)
Inpatient Rehabilitation Center Individual Statement of Services  Patient Name:  [name]  Date:  09/24/2015  Welcome to the [facility].  Our goal is to provide you with an individualized program based on your diagnosis and situation, designed to meet your specific needs.  With this comprehensive rehabilitation program, you will be expected to participate in at least 3 hours of rehabilitation therapies Monday-Friday, with modified therapy programming on the weekends.  Your rehabilitation program will include the following services:  Physical Therapy (PT), Occupational Therapy (OT), Speech Therapy (ST), 24 hour per day rehabilitation nursing, Therapeutic Recreaction (TR), Neuropsychology, Case Management (Social Worker), Rehabilitation Medicine, Nutrition Services and Pharmacy Services  Weekly team conferences will be held on Wednesday to discuss your progress.  Your Social Worker will talk with you frequently to get your input and to update you on team discussions.  Team conferences with you and your family in attendance may also be held.  Expected length of stay: 5-7 days Overall anticipated outcome: mod/i-supervision community ambulation  Depending on your progress and recovery, your program may change. Your Social Worker will coordinate services and will keep you informed of any changes. Your Social Worker's name and contact numbers are listed  below.  The following services may also be recommended but are not provided by the [facility] will be made to provide these services after discharge if needed.  Arrangements include referral to agencies that provide these services.  Your insurance has been verified to be:  [place] Your primary doctor is:  Eldridge Abrahams  Pertinent information will be shared with your  doctor and your insurance company.  Social Worker:  Ovidio Kin, [place] or ([phone]  Information discussed with and copy given to patient by: Elease Hashimoto, 09/24/2015, 9:27 AM

## 2015-09-24 NOTE — Progress Notes (Signed)
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  Medications: Prior to Admission medications   Medication Sig Start Date End Date Taking? Authorizing Provider  amLODipine (NORVASC) 10 MG tablet Take 1 tablet (10 mg total) by mouth daily. 10/08/14  Yes Davonna Belling, MD  aspirin EC 325 MG EC tablet Take 1 tablet (325 mg total) by mouth daily. 09/17/15  Yes Shanker Kristeen Mans, MD  atorvastatin (LIPITOR) 10 MG tablet Take 10 mg by mouth daily at 6 PM.  12/01/14 12/01/15 Yes Historical Provider, MD  baclofen (LIORESAL) 10 MG tablet Take 1 tablet (10 mg total) by mouth at bedtime. 09/17/15  Yes Shanker Kristeen Mans, MD  chlorthalidone (HYGROTON) 25 MG tablet Take 25 mg by mouth daily. 07/29/15 10/27/15 Yes Historical Provider, MD  clopidogrel (PLAVIX) 75 MG tablet Take 1 tablet (75 mg total) by mouth daily. 09/17/15  Yes Shanker Kristeen Mans, MD  divalproex (DEPAKOTE) 250 MG DR tablet Take 3 tablets (750 mg total) by mouth every 8 (eight) hours. 09/23/15  Yes Rosalin Hawking, MD  escitalopram (LEXAPRO) 10 MG tablet Take 10 mg by mouth daily.  11/29/14  Yes Historical Provider, MD  fluticasone (FLONASE) 50 MCG/ACT nasal spray Place 2 sprays into the nose daily. Patient taking differently: Place 2 sprays into the nose daily as needed for allergies.  09/07/12  Yes Cleatrice Burke, PA-C  lisinopril (PRINIVIL,ZESTRIL) 20 MG tablet Take 2 tablets (40 mg total) by mouth every morning. 10/08/14  Yes Davonna Belling, MD  metoprolol (LOPRESSOR) 50 MG tablet Take 2 tablets (100 mg total) by mouth 2 (two) times daily. 09/17/15  Yes Shanker Kristeen Mans, MD     Vital  Signs: BP 112/62 mmHg  Pulse 64  Temp(Src) 98 F (36.7 C) (Oral)  Resp 18  Ht 5\' 8"  (1.727 m)  Wt 241 lb (109.317 kg)  BMI 36.65 kg/m2  SpO2 98%  Physical Exam  Constitutional: He is oriented to person, place, and time.  Pulmonary/Chest: Breath sounds normal.  Abdominal: Soft.  Musculoskeletal: Normal range of motion.  Left hand sl weaker better  Neurological: He is alert and oriented to person, place, and time.  Skin: Skin is warm.  Psychiatric: He has a normal mood and affect. His behavior is normal. Judgment and thought content normal.    Imaging: Ct Angio Head W/cm &/or Wo Cm  09/20/2015  CLINICAL DATA:  Initial evaluation for acute left-sided weakness status post pipeline device placement for pericallosal aneurysm. Symptoms now all resolved. EXAM: CT ANGIOGRAPHY HEAD AND NECK TECHNIQUE: Multidetector CT imaging of the head and neck was performed using the standard protocol during bolus administration of intravenous contrast. Multiplanar CT image reconstructions and MIPs were obtained to evaluate the vascular anatomy. Carotid stenosis measurements (when applicable) are obtained utilizing NASCET criteria, using the distal internal carotid diameter as the denominator. CONTRAST:  50 cc of Isovue 370. COMPARISON:  Prior studies from earlier same day. FINDINGS: CTA NECK Aortic arch: Visualized aortic arch of normal caliber with normal branch pattern. No high-grade stenosis at the origin of the great vessels. Visualized subclavian arteries widely patent. Right carotid system: Right common carotid artery patent from its origin to the  bifurcation. Mild noncalcified plaque about the right bifurcation without stenosis. Right ICA widely patent from the bifurcation to the skullbase. No stenosis, dissection, or vascular occlusion within the right carotid artery system. Left carotid system: Left common carotid artery patent from its origin to the bifurcation. Minimal calcified noncalcified plaque  about the left bifurcation without stenosis. Left ICA patent from the bifurcation to the skullbase. No stenosis, dissection, or vascular occlusion within the left carotid artery system. Vertebral arteries:Both vertebral arteries arise from the subclavian arteries. Left vertebral artery is dominant. Vertebral arteries patent without stenosis, dissection, or occlusion. 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 No worrisome lytic or blastic osseous lesions. Other neck: Visualized lungs are clear. Visualized superior mediastinum within normal limits. Few scattered subcentimeter hypodense nodules noted within the thyroid gland, of doubtful clinical significance. No adenopathy within the neck. No acute soft tissue abnormality. CTA HEAD Anterior circulation: Petrous segments widely patent bilaterally. Minimal atheromatous plaque within the cavernous ICAs without stenosis. Supraclinoid segments widely patent. A1 segments well opacified. Dominant right ACA which is widely patent. 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 Persistent filling of the aneurysm which measures 8 x 7 x 9 mm. Flow seen distally the pipeline device within the distal pericallosal branches. M1 segments patent without stenosis or occlusion. MCA bifurcations normal. No proximal M2 branch occlusion or stenosis. Distal MCA branches well opacified. Posterior circulation: Vertebral arteries patent to the vertebrobasilar junction. Left vertebral artery dominant. Posterior inferior cerebral arteries patent. Basilar artery widely patent. Superior cerebellar arteries and posterior cerebral arteries well opacified. Prominent right posterior communicating artery with hypoplastic right P1 segment. Venous sinuses: No evidence for venous sinus thrombosis. Anatomic variants: No significant anatomic variant. Delayed phase:  Not performed. IMPRESSION: 1. No large or proximal arterial branch occlusion. No complication status post recent catheter directed arteriogram. 2. 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 3. Mild intracranial and extracranial atheromatous disease as above. Electronically Signed   By: Jeannine Boga M.D.   On: 09/20/2015 22:49   Ct Head Wo Contrast  09/20/2015  ADDENDUM REPORT: 09/20/2015 21:54 ADDENDUM: These results were called by telephone at the time of interpretation on 09/20/2015 at 9:49 pm to Dr. Wallie Char, who verbally acknowledged these results. Electronically Signed   By: Logan Bores M.D.   On: 09/20/2015 21:54  09/20/2015  CLINICAL DATA:  Code stroke. Left-sided facial droop. Status post endovascular treatment of pericallosal aneurysm earlier today. EXAM: CT HEAD WITHOUT CONTRAST TECHNIQUE: Contiguous axial images were obtained from the base of the skull through the vertex without intravenous contrast. COMPARISON:  Head MRI 09/15/2015 and CT 09/14/2015 FINDINGS: There has been interval placement of a pipeline stent for treatment of the previously described pericallosal aneurysm. No acute cortical infarct, intracranial hemorrhage, mass, midline shift, or extra-axial fluid collection is identified. Scattered, small foci of hypoattenuation in the cerebral white matter bilaterally are similar to the prior CT and nonspecific but compatible with mild chronic small vessel ischemic disease. Ventricles and sulci are normal in size. Orbits are unremarkable. Mild paranasal sinus mucosal thickening and prior left mastoidectomy are noted. IMPRESSION: 1. No evidence of acute intracranial abnormality following recent endovascular pericallosal aneurysm treatment. 2. Mild chronic small vessel ischemic disease. Electronically Signed: By: Logan Bores M.D. On: 09/20/2015 21:47   Ct Angio Neck W/cm &/or Wo/cm  09/20/2015  CLINICAL DATA:  Initial evaluation for acute left-sided  weakness  status post pipeline device placement for pericallosal aneurysm. Symptoms now all resolved. EXAM: CT ANGIOGRAPHY HEAD AND NECK TECHNIQUE: Multidetector CT imaging of the head and neck was performed using the standard protocol during bolus administration of intravenous contrast. Multiplanar CT image reconstructions and MIPs were obtained to evaluate the vascular anatomy. Carotid stenosis measurements (when applicable) are obtained utilizing NASCET criteria, using the distal internal carotid diameter as the denominator. CONTRAST:  50 cc of Isovue 370. COMPARISON:  Prior studies from earlier same day. FINDINGS: CTA NECK Aortic arch: Visualized aortic arch of normal caliber with normal branch pattern. No high-grade stenosis at the origin of the great vessels. Visualized subclavian arteries widely patent. Right carotid system: Right common carotid artery patent from its origin to the bifurcation. Mild noncalcified plaque about the right bifurcation without stenosis. Right ICA widely patent from the bifurcation to the skullbase. No stenosis, dissection, or vascular occlusion within the right carotid artery system. Left carotid system: Left common carotid artery patent from its origin to the bifurcation. Minimal calcified noncalcified plaque about the left bifurcation without stenosis. Left ICA patent from the bifurcation to the skullbase. No stenosis, dissection, or vascular occlusion within the left carotid artery system. Vertebral arteries:Both vertebral arteries arise from the subclavian arteries. Left vertebral artery is dominant. Vertebral arteries patent without stenosis, dissection, or occlusion. 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 No worrisome lytic or blastic osseous lesions. Other neck: Visualized lungs are clear. Visualized superior mediastinum within normal limits. Few scattered subcentimeter hypodense nodules  noted within the thyroid gland, of doubtful clinical significance. No adenopathy within the neck. No acute soft tissue abnormality. CTA HEAD Anterior circulation: Petrous segments widely patent bilaterally. Minimal atheromatous plaque within the cavernous ICAs without stenosis. Supraclinoid segments widely patent. A1 segments well opacified. Dominant right ACA which is widely patent. 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 Persistent filling of the aneurysm which measures 8 x 7 x 9 mm. Flow seen distally the pipeline device within the distal pericallosal branches. M1 segments patent without stenosis or occlusion. MCA bifurcations normal. No proximal M2 branch occlusion or stenosis. Distal MCA branches well opacified. Posterior circulation: Vertebral arteries patent to the vertebrobasilar junction. Left vertebral artery dominant. Posterior inferior cerebral arteries patent. Basilar artery widely patent. Superior cerebellar arteries and posterior cerebral arteries well opacified. Prominent right posterior communicating artery with hypoplastic right P1 segment. Venous sinuses: No evidence for venous sinus thrombosis. Anatomic variants: No significant anatomic variant. Delayed phase: Not performed. IMPRESSION: 1. No large or proximal arterial branch occlusion. No complication status post recent catheter directed arteriogram. 2. 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 3. Mild intracranial and extracranial atheromatous disease as above. Electronically Signed   By: Jeannine Boga M.D.   On: 09/20/2015 22:49   Mr Brain Wo Contrast  09/21/2015  CLINICAL DATA:  Pipeline stent for pericallosal aneurysm diversion. Facial numbness. EXAM: MRI HEAD WITHOUT CONTRAST TECHNIQUE: Multiplanar, multiecho pulse sequences of the brain and surrounding structures were obtained without intravenous contrast. COMPARISON:  MRI 09/15/2015  FINDINGS: Small, 3 mm areas of restricted diffusion in the high right frontal cortex, high right parietal cortex, and posterior right parietal cortex. These are most compatible with small embolic acute infarcts. No other acute infarct Artifact from pipeline stent in the anterior cerebral artery on the left. Ventricle size is normal.  Cerebral volume is normal Scattered small hyperintense lesions in the cerebral  white matter bilaterally and in the pons most compatible with chronic microvascular ischemia. Mild chronic ischemia in the thalamus bilaterally. No MR evidence of acute subarachnoid hemorrhage. Previous MRI demonstrated susceptibility in the left caudate head which is no longer visualized likely due to prior study being done on 3 tesla equipment in the current study 1.5 tesla equipment. Possible cavernoma. Mucosal edema in the paranasal sinuses bilaterally. 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 Pipeline stent left anterior cerebral artery. No evidence of acute intracranial hemorrhage. Electronically Signed   By: Franchot Gallo M.D.   On: 09/21/2015 14:21    Labs:  CBC:  Recent Labs  09/20/15 0905 09/21/15 0516 09/22/15 0354 09/24/15 0705  WBC 11.8* 11.5* 9.5 10.0  HGB 13.7 12.1* 12.1* 13.5  HCT 41.5 36.5* 37.5* 40.7  PLT 306 235 274 317    COAGS:  Recent Labs  09/14/15 2049 09/20/15 0905  INR 1.10 0.99  APTT 25 26    BMP:  Recent Labs  09/20/15 0905 09/21/15 0516 09/22/15 0354 09/24/15 0705  NA 140 139 140 139  K 4.0 3.7 3.8 3.5  CL 104 106 105 101  CO2 24 23 27 25   GLUCOSE 104* 93 89 83  BUN 16 8 8 15   CALCIUM 9.1 7.9* 8.3* 8.9  CREATININE 0.94 0.80 0.88 1.02  GFRNONAA >60 >60 >60 >60  GFRAA >60 >60 >60 >60    LIVER FUNCTION TESTS:  Recent Labs  09/14/15 2049 09/24/15 0705  BILITOT 0.7 0.7  AST 21 21  ALT 26 36  ALKPHOS 74 66  PROT 7.6 6.6  ALBUMIN 3.9 3.3*    Assessment and  Plan:  pericalossal aneurysm embolization 4/24 IP rehab- doing well Pt will hear from scheduler for follow up time and date  Electronically Signed: Abou Sterkel A 09/24/2015, 1:21 PM   I spent a total of 15 Minutes at the the patient's bedside AND on the patient's hospital floor or unit, greater than 50% of which was counseling/coordinating care for aneurysm embolization 4/24

## 2015-09-24 NOTE — Evaluation (Signed)
Physical Therapy Assessment and Plan  Patient Details  Name: [name] MRN: [id number] Date of Birth: 03/09/1956  PT Diagnosis: Abnormality of gait, Coordination disorder, Difficulty walking, Hemiparesis non-dominant and Muscle weakness Rehab Potential: Excellent ELOS: 5-7 days.    Today's Date: 09/24/2015 PT Individual Time: 1000-1100 AND 1600-1700 PT Individual Time Calculation (min): 60 min  AND 60 min  Problem List:  Patient Active Problem List   Diagnosis Date Noted  . 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 Cerebral infarction due to embolism of right middle cerebral artery (HCC) 09/23/2015  . Gait disturbance, post-stroke   . Drooping of mouth   . Stroke (HCC)   . Benign essential HTN   . Migraine with aura and without status migrainosus, not intractable   . Tachypnea   . Prediabetes   . Acute blood loss anemia   . Brain aneurysm 09/20/2015  . Type 2 diabetes mellitus (HCC) 09/15/2015  . Leukocytosis 09/15/2015  . TIA (transient ischemic attack) 09/14/2015  . History of tear of ACL (anterior cruciate ligament) 03/08/2011  . Depression 09/07/2010  . URI (upper respiratory infection) 08/24/2010  . DECREASED HEARING, LEFT EAR 09/06/2006  . Hyperlipidemia 04/23/2006  . Essential hypertension 04/23/2006  . INSOMNIA 04/23/2006    Past Medical History:  Past Medical History  Diagnosis Date  . Chest pain, atypical     12/2003:  negative cardiolyte  . HTN (hypertension)   . HLD (hyperlipidemia)   . Depression   . Hx of tear of ACL (anterior cruciate ligament)     right  . History of meniscal tear     bilateral  . Erectile dysfunction   . Insomnia   . Eczema   . Olecranon bursitis of left elbow 10/2009    s/p I&D by Dr Bean, initially assessed by Dr. Neal   . Allergy   . Anxiety   . Cataract   . Prediabetes   . TIA (transient ischemic attack)    Past Surgical History:  Past Surgical History  Procedure Laterality Date  . Skin tag removal      11 removed   . Knee arthroscopy    . Incise and drain abcess      L elbow due to cellulitis/bursitis  . Inner ear surgery    . Radiology with anesthesia N/A 09/20/2015    Procedure: EMBOLIZATION         (RADIOLOGY WITH ANESTHESIA);  Surgeon: Sanjeev Deveshwar, MD;  Location: [facility] OR;  Service: Radiology;  Laterality: N/A;    Assessment & Plan Clinical Impression: Patient is a 60 y.o. right handed male with history of hypertension, migraine headaches. Per chart review patient lives alone independently prior to admission. One level home 14 steps to entry. 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 Presented with numerous ED evaluations for headaches. Placed on Depakote for headache prevention. Admitted 09/14/2015 with facial numbness and headache. MRI of the brain showed no acute intracranial process. 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 No emergent large vessel occlusion. 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 Underwent pipeline stenting 09/20/2015 per interventional radiology. EEG showed no seizure.. 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 Echocardiogram with ejection fraction of 65% no wall motion abnormalities. Neurology follow-up currently continues on aspirin and Plavix therapy.  Patient transferred to CIR on 09/23/2015 .   Patient currently requires min with mobility secondary   to muscle weakness, decreased cardiorespiratoy endurance, decreased coordination, decreased visual perceptual skills, decreased attention to left, and decreased standing balance and decreased balance strategies.  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  Home access is 3 + 14Stairs to enter.  Patient will benefit from skilled PT intervention to maximize safe functional mobility, minimize fall risk  and decrease caregiver burden for planned discharge home with intermittent assist.  Anticipate patient will benefit from follow up [facility] at discharge.  PT - End of Session Activity Tolerance: Tolerates 30+ min activity without fatigue Endurance Deficit: Yes PT Assessment Rehab Potential (ACUTE/IP ONLY): Excellent Barriers to Discharge: Inaccessible home environment PT Patient demonstrates impairments in the following area(s): Balance;Behavior;Motor;Perception;Safety PT Transfers Functional Problem(s): Bed to Chair;Car;Furniture;Floor PT Locomotion Functional Problem(s): Ambulation;Stairs PT Plan PT Intensity: Minimum of 1-2 x/day ,45 to 90 minutes PT Frequency: 5 out of 7 days PT Duration Estimated Length of Stay: 5-7 days.  PT Treatment/Interventions: Ambulation/gait training;Balance/vestibular training;Cognitive remediation/compensation;Community reintegration;Discharge planning;Disease management/prevention;DME/adaptive equipment instruction;Functional electrical stimulation;Functional mobility training;Neuromuscular re-education;Pain management;Patient/family education;Psychosocial support;Stair training;Splinting/orthotics;Therapeutic Activities;Therapeutic Exercise;Skin care/wound management;UE/LE Strength taining/ROM;UE/LE Coordination activities PT Transfers Anticipated Outcome(s): Mod I  PT Locomotion Anticipated Outcome(s): Mod I  PT Recommendation Follow Up Recommendations: Home health PT Patient destination: Home Equipment Recommended: To be determined  Skilled Therapeutic Intervention Session 1  PT performed Evaluation and initiated treatment intervention; see below for results. Throughout treatment patient performed sit<>stand transfers with Supervision A from PT with min cues for improved weight shift and UE positioning. Stand pivot transfers to mat table x 4 throughout treatment with minA from PT for improved safety ands cues for increased BOS to prevent LOB. Patient instructed  in car transfer with min A and cues for safe technique to sit first and then bring legs into car. PT instructed patient in BERG balance test. See below for results. Patient performed gait back to room at end of session with min A to prevent 1 LOB. Left sitting in [facility] with call bell within reach and all needs met.   Session 2.  Patient received supine in bed and performed bed mobility without cues from PT.   Gait training in hall for 238f x 2 with min A from PT. Patient noted to have decreased step length on the LLE as well as difficulty attending to obstacles on the L without cues from therapist to scan visual field. 2 near LOB noted with gait training with patient turning head while walking.   PT instructed patient in DGI; see below for results. Patient instructed in balance training on Airex pad, eyes open 1 min x 2, eyes closed 30 seconds x 2 with min A from PT to prevent posterior LOB. Lateral reach L and R standing on airex x 8 each direction.   Patient's Daughter present for treatment and signed off on transfers to [place] Patient left sitting EOB with call bell within reach and daughter present.   PT Evaluation Precautions/Restrictions Precautions Precautions: Fall Restrictions Weight Bearing Restrictions: No General Chart Reviewed: Yes Family/Caregiver Present: No Vital Signs  Pain Pain Assessment Pain Assessment: No/denies pain Pain Score: 0-No pain Home Living/Prior Functioning Home Living Available Help at Discharge: Friend(s);Available PRN/intermittently Type of Home: Other(Comment) (Owns a Condo) Home Access: Stairs to enter [employer] Steps: 3 + 14 Entrance Stairs-Rails: Left;Right;Can reach both Home Layout: One level Bathroom Shower/Tub: Door;Tub/shower unit;Curtain BBiochemist, clinical Standard Bathroom Accessibility: Yes Additional Comments: 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  pt normally arrives home in am, showes, eats, naps a few  hours and wakes, naps again before going to work by 8 pm.  Lives With: Alone Prior Function Level of Independence: Independent with basic ADLs;Independent with homemaking with ambulation  Able to Take Stairs?: Yes Driving: Yes Vocation: Full time employment Vocation Requirements: Walks a lot on job, approx 4 miles/day, [employer], forklift. heavy lifting up to 90lb.  Comments: pt drives.  Drove himself to hospital in stick shift.  Vision/Perception     Cognition Overall Cognitive Status: Within Functional Limits for tasks assessed Arousal/Alertness: Awake/alert Orientation Level: Oriented X4 Attention: Sustained Sustained Attention: Appears intact Memory: Appears intact Awareness: Appears intact Problem Solving: Appears intact Safety/Judgment: Impaired Comments: denial of deficits causing decreased safety Sensation Sensation Light Touch: Impaired Detail Light Touch Impaired Details: Impaired LUE Stereognosis: Appears Intact Hot/Cold: Appears Intact Proprioception: Appears Intact Coordination Gross Motor Movements are Fluid and Coordinated: Yes Fine Motor Movements are Fluid and Coordinated: Yes Finger Nose Finger Test: Decreased speed of movements on the LUE  Heel Shin Test: [facility]  Motor  Motor Motor: Other (comment) Motor - Skilled Clinical Observations: Mild left UE hemiparesis, milld discordination with gait in L LE  Mobility Bed Mobility Bed Mobility: Rolling Right;Rolling Left;Supine to Sit;Sit to Supine Rolling Right: 6: Modified independent (Device/Increase time) Rolling Left: 6: Modified independent (Device/Increase time) Supine to Sit: 5: Supervision Sit to Supine: 5: Supervision Transfers Transfers: Yes Sit to Stand: 5: Supervision Sit to Stand Details: Verbal cues for technique;Verbal cues for precautions/safety Stand to Sit: 5: Supervision Stand to Sit Details (indicate cue type and reason): Verbal cues for technique;Verbal cues for  precautions/safety Stand Pivot Transfers: 4: Min guard Stand Pivot Transfer Details: Verbal cues for technique;Verbal cues for precautions/safety Locomotion  Ambulation Ambulation: Yes Ambulation/Gait Assistance: 4: Min guard Assistive device: None Gait Gait: Yes Gait Pattern: Impaired Gait Pattern: Step-through pattern;Decreased step length - right;Lateral hip instability Stairs / Additional Locomotion Stairs: Yes Stairs Assistance: 4: Min assist Stairs Assistance Details: Verbal cues for gait pattern;Verbal cues for safe use of DME/AE Stair Management Technique: Two rails Number of Stairs: 12 Height of Stairs: 6 Ramp: 4: Min assist Curb: 4: Min Chemical engineer: Yes Wheelchair Assistance: 5: Careers information officer: Both lower extermities Wheelchair Parts Management: Supervision/cueing Distance: 150  Trunk/Postural Assessment  Cervical Assessment Cervical Assessment: Exceptions to [facility] Cervical AROM Overall Cervical AROM: Due to premorid status Overall Cervical AROM Comments: Has had chiropractic manipulation of c-spine, possible OA Cervical Strength Overall Cervical Strength: Within functional limits for tasks performed Thoracic Assessment Thoracic Assessment: Within Functional Limits Lumbar Assessment Lumbar Assessment: Within Functional Limits Postural Control Postural Control: Within Functional Limits  Balance Balance Balance Assessed: Yes Standardized Balance Assessment Standardized Balance Assessment: Berg Balance Test;Dynamic Gait Index Berg Balance Test Sit to Stand: Able to stand without using hands and stabilize independently Standing Unsupported: Able to stand safely 2 minutes Sitting with Back Unsupported but Feet Supported on Floor or Stool: Able to sit safely and securely 2 minutes Stand to Sit: Sits safely with minimal use of hands Transfers: Able to transfer safely, minor use of hands Standing Unsupported  with Eyes Closed: Able to stand 10 seconds with supervision Standing Ubsupported with Feet Together: Able to place feet together independently and stand for 1 minute with supervision From Standing, Reach Forward with Outstretched Arm: Can reach forward >12 cm safely (5") From Standing Position, Pick up Object from Floor: Able to  pick up shoe safely and easily From Standing Position, Turn to Look Behind Over each Shoulder: Looks behind from both sides and weight shifts well Turn 360 Degrees: Able to turn 360 degrees safely but slowly Standing Unsupported, Alternately Place Feet on Step/Stool: Able to stand independently and safely and complete 8 steps in 20 seconds Standing Unsupported, One Foot in Front: Able to place foot tandem independently and hold 30 seconds Standing on One Leg: Able to lift leg independently and hold 5-10 seconds Total Score: 50 Dynamic Gait Index Level Surface: Mild Impairment Change in Gait Speed: Mild Impairment Gait with Horizontal Head Turns: Mild Impairment Gait with Vertical Head Turns: Normal Gait and Pivot Turn: Normal Step Over Obstacle: Mild Impairment Step Around Obstacles: Mild Impairment Steps: Mild Impairment Total Score: 18 Dynamic Sitting Balance Sitting balance - Comments: Supervision A for dynamic reaching tasks.  Static Standing Balance Static Standing - Comment/# of Minutes: Supervision A with increased time> 2 minutes.  Dynamic Standing Balance Dynamic Standing - Comments: min A with functional mobility tasks.    Patient demonstrates increased fall risk as noted by score of   50/56 on Berg Balance Scale.  (<36= high risk for falls, close to 100%; 37-45 significant >80%; 46-51 moderate >50%; 52-55 lower >25%) Patient demonstrates increased fall risk with <19 on DGI.    Extremity Assessment      RLE Assessment RLE Assessment: Within Functional Limits LLE Assessment LLE Assessment: Within Functional Limits   See Function Navigator for  Current Functional Status.   Refer to Care Plan for Long Term Goals  Recommendations for other services: None  Discharge Criteria: Patient will be discharged from PT if patient refuses treatment 3 consecutive times without medical reason, if treatment goals not met, if there is a change in medical status, if patient makes no progress towards goals or if patient is discharged from hospital.  The above assessment, treatment plan, treatment alternatives and goals were discussed and mutually agreed upon: by patient   E  09/24/2015, 5:57 PM  

## 2015-09-24 NOTE — Progress Notes (Signed)
Social Work  Social Work Assessment and Plan  Patient Details  Name: [name] MRN: [id number] Date of Birth: 1955-09-12  Today's Date: 09/24/2015  Problem List:  Patient Active Problem List   Diagnosis Date Noted  . 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 Cerebral infarction due to embolism of right middle cerebral artery ([place]) 09/23/2015  . Gait disturbance, post-stroke   . Drooping of mouth   . Stroke ([place])   . Benign essential HTN   . Migraine with aura and without status migrainosus, not intractable   . Tachypnea   . Prediabetes   . Acute blood loss anemia   . Brain aneurysm 09/20/2015  . Type 2 diabetes mellitus ([place]) 09/15/2015  . Leukocytosis 09/15/2015  . TIA (transient ischemic attack) 09/14/2015  . History of tear of ACL (anterior cruciate ligament) 03/08/2011  . Depression 09/07/2010  . URI (upper respiratory infection) 08/24/2010  . DECREASED HEARING, LEFT EAR 09/06/2006  . Hyperlipidemia 04/23/2006  . Essential hypertension 04/23/2006  . INSOMNIA 04/23/2006   Past Medical History:  Past Medical History  Diagnosis Date  . Chest pain, atypical     12/2003:  negative cardiolyte  . HTN (hypertension)   . HLD (hyperlipidemia)   . Depression   . Hx of tear of ACL (anterior cruciate ligament)     right  . History of meniscal tear     bilateral  . Erectile dysfunction   . Insomnia   . Eczema   . Olecranon bursitis of left elbow 10/2009    s/p I&D by Dr Maxie Better, initially assessed by Dr. Nori Riis   . Allergy   . Anxiety   . Cataract   . Prediabetes   . TIA (transient ischemic attack)    Past Surgical History:  Past Surgical History  Procedure Laterality Date  . Skin tag removal      11 removed  . Knee arthroscopy    . Incise and drain abcess      L elbow due to cellulitis/bursitis  . Inner ear surgery    . Radiology with anesthesia N/A 09/20/2015    Procedure: EMBOLIZATION         (RADIOLOGY WITH ANESTHESIA);  Surgeon: Luanne Bras, MD;  Location:  [place];  Service: Radiology;  Laterality: N/A;   Social History:  reports that he has never smoked. He has never used smokeless tobacco. He reports that he does not drink alcohol or use illicit drugs.  Family / Support Systems Marital Status: Divorced Patient Roles: Parent, Other (Comment) (employee) Children: Rene-daughter [phone]  [name]-daughter [phone] Other Supports: Rayann-ex-wife [phone] Anticipated Caregiver: Friends and Rene-local daughter in the evenings Ability/Limitations of Caregiver: Both daughter's work and only one is Administrator, Civil Service Availability: Intermittent Family Dynamics: Pt is close with his daughter's and has a good relationship with his ex-wife. He has friends who will check on him, but has no one to stay with him. He is hoping he will get to mod/i level and be able to negotiate his 14 steps.   Social History Preferred language: English Religion: Baptist Cultural Background: No issues Education: [employer] Read: Yes Write: Yes Employment Status: Employed Name of Employer: [employer] Return to Work Plans: Plans to go back  Freight forwarder Issues: No issues Guardian/Conservator: None-according to MD pt is capable of making his own decisions while here.   Abuse/Neglect Physical Abuse: Denies Verbal Abuse: Denies Sexual Abuse: Denies Exploitation of patient/patient's resources: Denies Self-Neglect: Denies  Emotional Status Pt's affect, behavior  adn adjustment status: Pt is motivated and wants to regain his independence back, he wants to get his life back. He has been dealing with this for a while now and atleast he knows what the cause of his headaches is and has been taken care of with the stents. Now is worried aobut his daughter's and if they have one. Recent Psychosocial Issues: other health issues-migranies hopefully will go away now anneurysm has been dealt with Pyschiatric History: History of  anxiety takes medication for and feels it is helpful.  This worker feels he would benefit from seeing neuro-psych while here for coping. Will make referral. Substance Abuse History: No issues  Patient / Family Perceptions, Expectations & Goals Pt/Family understanding of illness & functional limitations: Pt is able to explain his surgery and his family history-his Mom died of an anneurysm. He talks with the MD daily and feels his questions and concerns have been addressed. Premorbid pt/family roles/activities: father, employee, friend, co-worker, etc Anticipated changes in roles/activities/participation: resume Pt/family expectations/goals: Pt states: " I want to be able to take care of myself, my hand is bothering me, but need to get up 14 stairs at home also."  [employer]: None Premorbid Home Care/DME Agencies: None Transportation available at discharge: Family and friends Resource referrals recommended: Neuropsychology, Support group (specify)  Discharge Planning Living Arrangements: Alone Support Systems: Children, Friends/neighbors Type of Residence: Private residence Insurance Resources: Multimedia programmer (specify) Psychologist, counselling) Financial Resources: Employment Museum/gallery curator Screen Referred: No Living Expenses: Higher education careers adviser Management: Patient Does the patient have any problems obtaining your medications?: No Home Management: Patient Patient/Family Preliminary Plans: Return home with intermittent assist from daughter and friends. Daughter has recently gotten a new job here and can not take any time off, but can come by in the evenings to assist. Pt has limited supports but is recovering from his anneurysm stenting. Will need to get him mod/i since, [facility] will not likely cover NH due to coming to rehab. Social Work Anticipated Follow Up Needs: HH/OP, Support Group  Clinical Impression Pleasant gentleman who is motivated to improve and recover form his anneurysm  stenting. He glad it was found and dealt with he has been having issues for months with headaches. He is worried about his daughter's now and want them to have MRI soon to check for one. He needs to get mod/i to be able to safely go home and it is unlikely [facility] will cover NH due to coming to rehab, pt made aware of this and he does not want to go to a NH, he would go home instead. He will benefit form neuro-psych will make referral. Await team' evaluations and work on a safe discharge plan.  Elease Hashimoto 09/24/2015, 10:07 AM

## 2015-09-24 NOTE — Progress Notes (Signed)
STROKE TEAM PROGRESS NOTE   SUBJECTIVE (INTERVAL HISTORY) No family at bedside. He still complains of intermittent lip and cheek and left hand numbness feeling. Noted to be anxious during round, frequent deep breath. Left facial droop much better and left hand weakness improving.   OBJECTIVE Temp:  [98 F (36.7 C)-98.3 F (36.8 C)] 98.3 F (36.8 C) (04/28 1348) Pulse Rate:  [61-76] 76 (04/28 1348) Cardiac Rhythm:  [-]  Resp:  [16-18] 16 (04/28 1348) BP: (110-135)/(55-80) 135/80 mmHg (04/28 1348) SpO2:  [97 %-98 %] 97 % (04/28 1348)  CBC:   Recent Labs Lab 09/21/15 0516 09/22/15 0354 09/24/15 0705  WBC 11.5* 9.5 10.0  NEUTROABS 8.2*  --  6.5  HGB 12.1* 12.1* 13.5  HCT 36.5* 37.5* 40.7  MCV 85.5 87.0 86.4  PLT 235 274 [id number]    Basic Metabolic Panel:   Recent Labs Lab 09/22/15 0354 09/24/15 0705  NA 140 139  K 3.8 3.5  CL 105 101  CO2 27 25  GLUCOSE 89 83  BUN 8 15  CREATININE 0.88 1.02  CALCIUM 8.3* 8.9    Lipid Panel:     Component Value Date/Time   CHOL 158 09/22/2015 0354   TRIG 145 09/22/2015 0354   HDL 34* 09/22/2015 0354   CHOLHDL 4.6 09/22/2015 0354   VLDL 29 09/22/2015 0354   LDLCALC 95 09/22/2015 0354   HgbA1c:  Lab Results  Component Value Date   HGBA1C 6.1* 09/22/2015   Urine Drug Screen:     Component Value Date/Time   LABOPIA NEG 06/07/2006 0039   COCAINSCRNUR NEG 06/07/2006 0039   LABBENZ NEG 06/07/2006 0039   AMPHETMU NEG 06/07/2006 0039      IMAGING I have personally reviewed the radiological images below and agree with the radiology interpretations.  Ct Head Wo Contrast 09/20/2015  1. No evidence of acute intracranial abnormality following recent endovascular pericallosal aneurysm treatment. 2. Mild chronic small vessel ischemic disease.   Ct Angio Head & Neck W/cm &/or Wo/cm 09/20/2015  1. No large or proximal arterial branch occlusion. No complication status post recent catheter directed arteriogram. 2. Successful placement  of pipeline device for pericallosal aneurysm. 3. Mild intracranial and extracranial atheromatous disease as above.   Mr Brain Wo Contrast 09/21/2015  Three small areas of acute infarct in the right MCA territory likely areas of embolic infarction. Pipeline stent left anterior cerebral artery. No evidence of acute intracranial hemorrhage.   TTE - - Left ventricle: The cavity size was normal. Systolic function was  normal. The estimated ejection fraction was in the range of 60%  to 65%. Wall motion was normal; there were no regional wall  motion abnormalities. Left ventricular diastolic function  parameters were normal. - Mitral valve: There was no regurgitation. - Right ventricle: The cavity size was normal. Wall thickness was  normal. Systolic function was normal. - Atrial septum: No defect or patent foramen ovale was identified  by color flow Doppler. - Tricuspid valve: There was no regurgitation. - Inferior vena cava: The vessel was normal in size. The  respirophasic diameter changes were in the normal range (>= 50%),  consistent with normal central venous pressure.  EEG - Clinical Interpretation: This normal EEG is recorded in the waking and sleep state. There was no seizure or seizure predisposition recorded on this study. Please note that a normal EEG does not preclude the possibility of epilepsy.    PHYSICAL EXAM  Temp:  [98 F (36.7 C)-98.3 F (36.8 C)] 98.3 F (  36.8 C) (04/28 1348) Pulse Rate:  [61-76] 76 (04/28 1348) Resp:  [16-18] 16 (04/28 1348) BP: (110-135)/(55-80) 135/80 mmHg (04/28 1348) SpO2:  [97 %-98 %] 97 % (04/28 1348)  General - Well nourished, well developed, in no apparent distress.  Ophthalmologic - Fundi not visualized due to eye movement.  Cardiovascular - Regular rate and rhythm with no murmur.  Mental Status -  Level of arousal and orientation to time, place, and person were intact. Language including expression, naming, repetition,  comprehension was assessed and found intact. Fund of Knowledge was assessed and was intact.  Cranial Nerves II - XII - II - Visual field intact OU. III, IV, VI - Extraocular movements intact. V - Facial sensation intact bilaterally. VII - left mild facial droop. VIII - Hearing & vestibular intact bilaterally. X - Palate elevates symmetrically, mild dysarthria. XI - Chin turning & shoulder shrug intact bilaterally. XII - Tongue protrusion intact.  Motor Strength - The patient's strength was normal in all extremities except LUE bicep 4+/5, tricep 4+/5, and hand grip 3/5 with dexterity difficulty and pronator drift was absent. Bulk was normal and fasciculations were absent.  Motor Tone - Muscle tone was assessed at the neck and appendages and was normal.  Reflexes - The patient's reflexes were 1+ in all extremities and he had no pathological reflexes.  Sensory - Light touch, temperature/pinprick were assessed and were symmetrical.   Coordination - The patient had normal movements in the hand with no ataxia or dysmetria. Tremor was absent.  Gait and Station - not tested due to safety concerns.   ASSESSMENT/PLAN [name] is a 60 y.o. male with history of HTN, HLD, migraine with aura, anxiety and depression presenting with intermittent left facial numbness/weakness, slurry speech and left hand weakness and numbness s/p pipeline stent placement. He did not receive IV t-PA due to recent stent placement.   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   Diet Carb Modified Fluid consistency:: Thin; Room service appropriate?: Yes  aspirin 325 mg daily and clopidogrel 75 mg daily prior to admission, now  on aspirin 325 mg daily and clopidogrel 75 mg daily. IV heparin stopped  Patient counseled to be compliant with his antithrombotic medications  Ongoing aggressive stroke risk factor management  Therapy recommendations:  CIR  Disposition:  CIR   Right ACA aneurysm s/p pipeline  IR on board  CTA head and neck showed good flow  Follow up with Dr. Estanislado Pandy  Left facial and left hand intermittent numbness - complicated migraine most likely  visual aura with tunnel vision and squiggly lines at home  bilateral face, lip and hands numbness at home  Intermittent left facial and left hand numbness with HA  On depakote for preventive meds       On tylenol and fioricet for abortive therapy  EEG negative for seizure  continue depakote to 750mg  Q8h  depakote level 86 in am - please adjust the depakote dose to keep level between 70-100  Hypertension  Stable  BP goal normotensive  Hyperlipidemia  Home meds:  On lipitor, resumed in hospital  LDL 95, goal < 70  Continue statin at discharge  Other Stroke Risk Factors  Obesity, Body mass index is 36.65 kg/(m^2).   Family hx stroke (mother)  Other Active Problems  Depression on [facility] day # 1  Neurology will sign off. Please call with questions. Pt will follow up with Dr. Erlinda Hong at [facility] in about 2 months. Thanks for the consult.   Rosalin Hawking, MD PhD Stroke Neurology 09/24/2015 6:23 PM    To contact Stroke Continuity provider, please refer to [email or web address] After hours, contact General Neurology

## 2015-09-24 NOTE — Progress Notes (Signed)
60 y.o. right handed male with history of hypertension, migraine headaches. Per chart review patient lives alone independently prior to admission. One level home 14 steps to entry. Patient works at [employer]. Presented with numerous ED evaluations for headaches. Placed on Depakote for headache prevention. Admitted 09/14/2015 with facial numbness and headache. MRI of the brain showed no acute intracranial process. 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 No emergent large vessel occlusion. 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 Underwent pipeline stenting 09/20/2015 per interventional radiology. EEG showed no seizure.. 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 Echocardiogram with ejection fraction of 65% no wall motion abnormalities  Subjective/Complaints: Episode of facial twitching and tingle,  ROS No CP/SOB, N/V/D Objective: Vital Signs: Blood pressure 114/61, pulse 61, temperature 98 F (36.7 C), temperature source Oral, resp. rate 18, height 5\' 8"  (1.727 m), weight 109.317 kg (241 lb), SpO2 98 %. No results found. Results for orders placed or performed during the hospital encounter of 09/23/15 (from the past 72 hour(s))  CBC WITH DIFFERENTIAL     Status: None   Collection Time: 09/24/15  7:05 AM  Result Value Ref Range   WBC 10.0 4.0 - 10.5 K/uL   RBC 4.71 4.22 - 5.81 MIL/uL   Hemoglobin 13.5 13.0 - 17.0 g/dL   HCT 40.7 39.0 - 52.0 %   MCV 86.4 78.0 - 100.0 fL   MCH 28.7 26.0 - 34.0 pg   MCHC 33.2 30.0 - 36.0 g/dL   RDW 13.6 11.5 - 15.5 %   Platelets 317 150 - 400 K/uL   Neutrophils Relative % 65 %   Neutro Abs 6.5 1.7 - 7.7 K/uL   Lymphocytes Relative 19 %   Lymphs Abs 1.9 0.7 - 4.0 K/uL   Monocytes Relative 9 %   Monocytes Absolute 0.9 0.1 - 1.0 K/uL   Eosinophils Relative 6 %   Eosinophils Absolute 0.6 0.0 - 0.7  K/uL   Basophils Relative 1 %   Basophils Absolute 0.1 0.0 - 0.1 K/uL  Valproic acid level     Status: None   Collection Time: 09/24/15  7:05 AM  Result Value Ref Range   Valproic Acid Lvl 86 50.0 - 100.0 ug/mL     HEENT: twitching at corner of mouth on left Cardio: RRR and no murmur Resp: CTA B/L and unlabored GI: BS positive and NT, ND Extremity:  Pulses positive and No Edema Skin:   Intact Neuro: Alert/Oriented, Cranial Nerve Abnormalities Left central VII, Normal Sensory and Abnormal Motor 3- in Left hand intrinsics, finger flexion and ext, 4+ in Bi, tri Delt Musc/Skel:  Normal Gen NAD   Assessment/Plan: 1. 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 Physiatrist is providing close team supervision and 24 hour management of active medical problems listed below. Physiatrist and rehab team continue to assess barriers to discharge/monitor patient progress toward functional and medical goals. FIM:                                   Medical Problem List and Plan: 1.  Headache, facial and left arm numbness secondary to right MCA aneurysm status pipeline stenting/right MCA embolic infarct- Initiate  CIR 2.  DVT Prophylaxis/Anticoagulation: SCDs. Monitor for any signs of DVT 3. Pain Management/migraine headaches: Baclofen 10 mg daily at bedtime, Depakote 500 mg every 12 hours, Fioricet as needed 4. Mood. Lexapro 10 mg daily 5. Neuropsych: This patient is capable of making decisions on his own behalf. 6. Skin/Wound Care: Routine skin checks 7. Fluids/Electrolytes/Nutrition: Routine I&O's with follow-up chemistries 8. Hypertension. Norvasc 10 mg daily, chlorthalidone 25 mg daily, lisinopril 40 mg daily, Lopressor 100 mg twice a day. Monitor with increased mobility 9. Hyperlipidemia. Lipitor 10. Prediabetic. Hemoglobin  A1c 6.1. Begin diabetic diet. No current plan to check blood sugars. Follow-up outpatient 11.  Left facial twitching likely migraine related as it has sensory component, discussed with Neuro Dr Erlinda Hong LOS (Days) 1 A FACE TO [place] E 09/24/2015, 8:44 AM

## 2015-09-24 NOTE — Evaluation (Signed)
Occupational Therapy Assessment and Plan  Patient Details  Name: ARMEN WARING MRN: [id number] Date of Birth: 1955-09-10  OT Diagnosis:disturbance of vision and muscle weakness (generalized) Rehab Potential: Rehab Potential (ACUTE ONLY): Excellent ELOS: 5-7 days   Today's Date: 09/24/2015 OT Individual Time:0730-0845 75 Minutes        Problem List:  Patient Active Problem List   Diagnosis Date Noted  . 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 Cerebral infarction due to embolism of right middle cerebral artery ([place]) 09/23/2015  . Gait disturbance, post-stroke   . Drooping of mouth   . Stroke ([place])   . Benign essential HTN   . Migraine with aura and without status migrainosus, not intractable   . Tachypnea   . Prediabetes   . Acute blood loss anemia   . Brain aneurysm 09/20/2015  . Type 2 diabetes mellitus ([place]) 09/15/2015  . Leukocytosis 09/15/2015  . TIA (transient ischemic attack) 09/14/2015  . History of tear of ACL (anterior cruciate ligament) 03/08/2011  . Depression 09/07/2010  . URI (upper respiratory infection) 08/24/2010  . DECREASED HEARING, LEFT EAR 09/06/2006  . Hyperlipidemia 04/23/2006  . Essential hypertension 04/23/2006  . INSOMNIA 04/23/2006    Past Medical History:  Past Medical History  Diagnosis Date  . Chest pain, atypical     12/2003:  negative cardiolyte  . HTN (hypertension)   . HLD (hyperlipidemia)   . Depression   . Hx of tear of ACL (anterior cruciate ligament)     right  . History of meniscal tear     bilateral  . Erectile dysfunction   . Insomnia   . Eczema   . Olecranon bursitis of left elbow 10/2009    s/p I&D by Dr Maxie Better, initially assessed by Dr. Nori Riis   . Allergy   . Anxiety   . Cataract   . Prediabetes   . TIA (transient ischemic attack)    Past Surgical History:  Past Surgical History  Procedure Laterality Date  . Skin tag removal      11 removed  . Knee arthroscopy    . Incise and drain abcess      L elbow due to  cellulitis/bursitis  . Inner ear surgery    . Radiology with anesthesia N/A 09/20/2015    Procedure: EMBOLIZATION         (RADIOLOGY WITH ANESTHESIA);  Surgeon: Luanne Bras, MD;  Location: [facility];  Service: Radiology;  Laterality: N/A;    Assessment & Plan Clinical Impression: Patient is a 60 y.o. right handed male with history of hypertension, migraine headaches. Per chart review patient lives alone independently prior to admission. One level home 14 steps to entry. Patient works at [employer]. Presented with numerous ED evaluations for headaches. Placed on Depakote for headache prevention. Admitted 09/14/2015 with facial numbness and headache. MRI of the brain showed no acute intracranial process. 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 No emergent large vessel occlusion. 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 Underwent pipeline stenting 09/20/2015 per interventional radiology. EEG showed no seizure.. 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 Echocardiogram with ejection fraction of 65% no wall motion abnormalities. Neurology follow-up currently continues on aspirin and Plavix therapy.  Patient transferred to CIR on 09/23/2015.  Patient currently requires minimum with basic self-care skills secondary to decreased coordination.  Prior to hospitalization, patient could  complete BADL and iADL independently.   Patient will benefit from skilled intervention to increase independence with basic self-care skills and increase level of independence with iADL prior to discharge home independently.  Anticipate patient will require intermittent supervision and follow up outpatient.  OT - End of Session Activity Tolerance: Tolerates 30+ min activity with multiple rests Endurance Deficit: Yes OT Assessment Rehab Potential (ACUTE ONLY):  Excellent Barriers to Discharge: Decreased caregiver support OT Patient demonstrates impairments in the following area(s): Safety;Vision;Endurance;Motor OT Basic ADL's Functional Problem(s): Bathing;Dressing;Toileting OT Advanced ADL's Functional Problem(s): Simple Meal Preparation;Light Housekeeping OT Transfers Functional Problem(s): Toilet;Tub/Shower OT Additional Impairment(s): Fuctional Use of Upper Extremity OT Plan OT Intensity: Minimum of 1-2 x/day, 45 to 90 minutes OT Frequency: 5 out of 7 days OT Duration/Estimated Length of Stay: 5-7 days OT Treatment/Interventions: Functional mobility training;Neuromuscular re-education;Therapeutic Activities;Therapeutic Exercise;UE/LE Coordination activities;UE/LE Strength taining/ROM;Patient/family education;Self Care/advanced ADL retraining;DME/adaptive equipment instruction;Discharge planning OT Self Feeding Anticipated Outcome(s): Mod I OT Basic Self-Care Anticipated Outcome(s): Mod I OT Toileting Anticipated Outcome(s): Mod I OT Bathroom Transfers Anticipated Outcome(s): Mod I OT Recommendation Recommendations for Other Services: Speech consult Patient destination: Home Follow Up Recommendations: Outpatient OT Equipment Recommended: To be determined   Skilled Therapeutic Intervention OT 1:1 evaluation completed with intervention provided to address improved safety awareness, orientation and education on methods and goals of treatment, and adapted bathing/dressing skills.   Pt ambulated in room w/o AD with close supervision, instructional cues, and intermitten rest breaks.  Pt observed with impaired [place] at left hand limiting prehension and manipulation of objects, containers, and clothing items.   Pt left at sink grooming after setup to shave.  OT Evaluation Precautions/Restrictions  Precautions Precautions: Fall Restrictions Weight Bearing Restrictions: No  General Chart Reviewed: Yes Family/Caregiver Present: No  Vital  Signs Therapy Vitals Temp: 98 F (36.7 C) Temp Source: Oral Pulse Rate: 61 Resp: 18 BP: 114/61 mmHg Patient Position (if appropriate): Lying Oxygen Therapy SpO2: 98 % O2 Device: Not Delivered  Pain Pain Assessment Pain Assessment: No/denies pain  Home Living/Prior Functioning Home Living Available Help at Discharge: Friend(s), Available PRN/intermittently Type of Home: Other(Comment) (Owns a Condo) Home Access: Stairs to enter Technical brewer of Steps: 14 Entrance Stairs-Rails: Left, Right, Can reach both Home Layout: One level Bathroom Shower/Tub: Door, Public librarian, Architectural technologist: Standard Bathroom Accessibility: Yes Additional Comments: 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  pt normally arrives home in am, showes, eats, naps a few hours and wakes, naps again before going to work by 8 pm.  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 . .  Prior Function Level of Independence: Independent with basic ADLs, Independent with homemaking with ambulation  Able to Take Stairs?: Yes Driving: Yes Vocation: Full time employment Vocation Requirements: Walks a lot on job, approx 4 miles/day, [employer], forklift Comments: pt drives.  Drove himself to hospital.  ADL See Functional Tool  Vision/Perception  Vision- History Baseline Vision/History: Wears glasses;Cataracts Wears Glasses: At all times (also wears contact  lenses) Patient Visual Report: Other (comment);Peripheral vision impairment Vision- Assessment Vision Assessment?: Vision impaired- to be further tested in functional  context Eye Alignment: Within Functional Limits Ocular Range of Motion: Impaired-to be further tested in functional context;Restricted on the left Alignment/Gaze Preference: Head tilt;Head turned Tracking/Visual Pursuits: Decreased smoothness of eye movement to LEFT superior field;Decreased smoothness of eye movement to LEFT inferior field;Requires cues, head turns, or add eye shifts to track;Impaired - to be further tested in functional context Saccades: Within functional limits Convergence: Within functional limits Visual Fields: Other (comment)   Cognition Orientation Level: Person;Place;Situation Person: Oriented Place: Oriented Situation: Oriented Year: 2017 Month: April Memory: Appears intact Immediate Memory Recall: Sock;Blue;Bed Memory Recall: Sock;Blue;Bed Memory Recall Sock: Without Cue Memory Recall Blue: With Cue Memory Recall Bed: With Cue Attention: Sustained Sustained Attention: Appears intact Awareness: Appears intact Problem Solving: Appears intact Safety/Judgment: Appears intact  Sensation Sensation Light Touch: Impaired Detail Light Touch Impaired Details: Impaired LUE Stereognosis: Appears Intact Hot/Cold: Appears Intact Proprioception: Appears Intact Coordination Gross Motor Movements are Fluid and Coordinated: Yes Fine Motor Movements are Fluid and Coordinated: Yes  Motor  Motor Motor: Other (comment) Motor - Skilled Clinical Observations: Mild left UE hemiparesis, Impared FMC/dexterity at left hand  Mobility  Bed Mobility Bed Mobility: Rolling Right Transfers Transfers: Sit to Stand;Stand to Sit Sit to Stand: 5: Supervision Stand to Sit: 5: Supervision   Trunk/Postural Assessment  Cervical Assessment Cervical Assessment: Exceptions to [facility] Cervical AROM Overall Cervical  AROM: Due to premorid status Overall Cervical AROM Comments: Has had chiropractic manipulation of c-spine, possible OA Cervical Strength Overall Cervical Strength: Within functional limits for tasks performed Thoracic Assessment Thoracic Assessment: Within Functional Limits Lumbar Assessment Lumbar Assessment: Within Functional Limits Postural Control Postural Control: Within Functional Limits   Balance Balance Balance Assessed: Yes Standardized Balance Assessment Standardized Balance Assessment: Berg Balance Test Berg Balance Test Sit to Stand: Able to stand without using hands and stabilize independently Standing Unsupported: Able to stand safely 2 minutes Sitting with Back Unsupported but Feet Supported on Floor or Stool: Able to sit safely and securely 2 minutes Stand to Sit: Sits safely with minimal use of hands Transfers: Able to transfer safely, minor use of hands Standing Unsupported with Eyes Closed: Able to stand 10 seconds with supervision Standing Ubsupported with Feet Together: Able to place feet together independently and stand for 1 minute with supervision From Standing, Reach Forward with Outstretched Arm: Can reach forward >12 cm safely (5") From Standing Position, Pick up Object from Floor: Able to pick up shoe safely and easily From Standing Position, Turn to Look Behind Over each Shoulder: Looks behind from both sides and weight shifts well Turn 360 Degrees: Able to turn 360 degrees safely but slowly Standing Unsupported, Alternately Place Feet on Step/Stool: Able to stand independently and safely and complete 8 steps in 20 seconds Standing Unsupported, One Foot in Front: Able to place foot tandem independently and hold 30 seconds Standing on One Leg: Able to lift leg independently and hold 5-10 seconds Total Score: 50  Extremity/Trunk Assessment RUE Assessment RUE Assessment: Within Functional Limits LUE Assessment LUE Assessment: Exceptions to [facility] LUE  Strength LUE Overall Strength: Deficits (4/5 at shoulders,weak grip/pinch strength)   See Function Navigator for Current Functional Status.   Refer to Care Plan for Long Term Goals  Recommendations for other services: None  Discharge Criteria: Patient will be discharged from OT if patient refuses treatment 3 consecutive times without medical reason, if treatment goals not met, if there is a change in medical status, if patient makes no progress towards goals or if patient is discharged from  hospital.  The above assessment, treatment plan, treatment alternatives and goals were discussed and mutually agreed upon: by patient  [facility] 09/24/2015, 12:56 PM

## 2015-09-25 ENCOUNTER — Inpatient Hospital Stay (HOSPITAL_COMMUNITY): Payer: Managed Care, Other (non HMO) | Admitting: Physical Therapy

## 2015-09-25 ENCOUNTER — Inpatient Hospital Stay (HOSPITAL_COMMUNITY): Payer: Managed Care, Other (non HMO) | Admitting: Speech Pathology

## 2015-09-25 ENCOUNTER — Inpatient Hospital Stay (HOSPITAL_COMMUNITY): Payer: Managed Care, Other (non HMO)

## 2015-09-25 DIAGNOSIS — F411 Generalized anxiety disorder: Secondary | ICD-10-CM

## 2015-09-25 DIAGNOSIS — G43909 Migraine, unspecified, not intractable, without status migrainosus: Secondary | ICD-10-CM

## 2015-09-25 LAB — VALPROIC ACID LEVEL: Valproic Acid Lvl: 82 ug/mL (ref 50.0–100.0)

## 2015-09-25 NOTE — Progress Notes (Signed)
Occupational Therapy Session Note  Patient Details  Name: [name] MRN: [id number] Date of Birth: 1956/01/18  Today's Date: 09/25/2015 OT Individual Time: 0900-1000 OT Individual Time Calculation (min): 60 min    Short Term Goals: Week 1:  OT Short Term Goal 1 (Week 1): STG=LTG d/t short LOS  Skilled Therapeutic Interventions/Progress Updates: ADL-retraining (45 min) with focus on toileting and safety awareness.   Pt received rising from his w/c and requesting assist to toilet.   Pt complained that he is requiring frequent toileting d/t ongoing diarrhea and staff are unable to assist him sufficiently d/t urgency.  OT assisted pt to toilet where he voided loose and watery BM, completing all clothing mgmt and hygiene unassisted.   Pt then progressed to shower and bathed but returned to toilet again after bathing.   Pt ambulated to w/c at sink to groom and dress and again returned to toilet one more time to void remaining loose BM.     After last BM, pt was willing to ambulate to [facility] gym to engage in exploration of visual impairment involving left peripheral field (15 min).   Pt oriented to dynavision and engaged in 3 sessions, 60 seconds each using just his right hand to reach targets, then left hand and finishing with both.   Pt observed with good reaction time in all tasks with mild delay only at left lower quadrant (1.2 seconds vs 1.1).   Pt's fastest times on task was using both hands with diagonal reaching pattern used frequently to cross midline.    Pt noted with mild ataxic gait during ambulation from and to his room (tightrope walking) and advised to stay within reach of handrail.     Therapy Documentation Precautions:  Precautions Precautions: Fall Restrictions Weight Bearing Restrictions: No  Vital Signs: Therapy Vitals BP: 125/75 mmHg   Pain: Pain Assessment Pain Assessment: No/denies pain  See Function Navigator for Current Functional Status.   Therapy/Group: Individual  Therapy  [place] 09/25/2015, 12:27 PM

## 2015-09-25 NOTE — Evaluation (Signed)
Speech Language Pathology Assessment and Plan  Patient Details  Name: DRAYVEN MARCHENA MRN: [id number] Date of Birth: 11/14/1955  SLP Diagnosis: Cognitive Impairments  Rehab Potential: Excellent ELOS: 5 to 7 days    Today's Date: 09/25/2015 SLP Individual Time: 0800-0900 SLP Individual Time Calculation (min): 60 min   Problem List:  Patient Active Problem List   Diagnosis Date Noted  . Complicated migraine   . Anxiety state   . 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 Cerebral infarction due to embolism of right middle cerebral artery ([place]) 09/23/2015  . Gait disturbance, post-stroke   . Drooping of mouth   . Stroke ([place])   . Benign essential HTN   . Migraine with aura and without status migrainosus, not intractable   . Tachypnea   . Prediabetes   . Acute blood loss anemia   . Brain aneurysm 09/20/2015  . Type 2 diabetes mellitus ([place]) 09/15/2015  . Leukocytosis 09/15/2015  . TIA (transient ischemic attack) 09/14/2015  . History of tear of ACL (anterior cruciate ligament) 03/08/2011  . Depression 09/07/2010  . URI (upper respiratory infection) 08/24/2010  . DECREASED HEARING, LEFT EAR 09/06/2006  . Hyperlipidemia 04/23/2006  . Essential hypertension 04/23/2006  . INSOMNIA 04/23/2006   Past Medical History:  Past Medical History  Diagnosis Date  . Chest pain, atypical     12/2003:  negative cardiolyte  . HTN (hypertension)   . HLD (hyperlipidemia)   . Depression   . Hx of tear of ACL (anterior cruciate ligament)     right  . History of meniscal tear     bilateral  . Erectile dysfunction   . Insomnia   . Eczema   . Olecranon bursitis of left elbow 10/2009    s/p I&D by Dr Maxie Better, initially assessed by Dr. Nori Riis   . Allergy   . Anxiety   . Cataract   . Prediabetes   . TIA (transient ischemic attack)    Past Surgical History:  Past Surgical History  Procedure Laterality Date  . Skin tag removal      11 removed  . Knee arthroscopy    . Incise and drain abcess       L elbow due to cellulitis/bursitis  . Inner ear surgery    . Radiology with anesthesia N/A 09/20/2015    Procedure: EMBOLIZATION         (RADIOLOGY WITH ANESTHESIA);  Surgeon: Luanne Bras, MD;  Location: [facility];  Service: Radiology;  Laterality: N/A;    Assessment / Plan / Recommendation Clinical Impression 60 y.o. right handed male with history of hypertension, migraine headaches. Per chart review patient lives alone independently prior to admission. One level home 14 steps to entry. Patient works at [employer]. Presented with numerous ED evaluations for headaches. Placed on Depakote for headache prevention. Admitted 09/14/2015 with facial numbness and headache. MRI of the brain showed no acute intracranial process. 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 No emergent large vessel occlusion. 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 Underwent pipeline stenting 09/20/2015 per interventional radiology. EEG showed no seizure.. 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 Echocardiogram with ejection fraction of 65% no wall motion abnormalities. Patient admitted to CIR on 09/24/2015. Cognitive-Linguistic evaluation completed on 09/25/2015. Pt received the scores that fell within normal limits on Cognitive Linguistic Quick Test. However, patient continues to  require Mod I support when solving complex problems concerning his deficits within his home and work environment. Pt reports that his left side facial weakness is better overall and only intermittently "feels weak." During assessment, patient didn't display any s/s of facial drooping or weakness. He was intelligible at the conversation level and appears to tolerate a regular diet with thin liquids no reports of aspiration/dysphagia. Overall patient demonstrated Mod I level of support during  evaluation and would benefit from skilled ST during inpatient rehabilitation to increase level to independent to improve function and increase safety prior to discharge.    Skilled Therapeutic Interventions          Cognitive Linguistic Evaluation completed. See above for details. Plan of care discussed with patient and he voiced agreement.    SLP Assessment  Patient will need skilled Speech Lanaguage Pathology Services during CIR admission    Recommendations  SLP Diet Recommendations: Age appropriate regular solids;Thin Liquid Administration via: Cup;Straw Medication Administration: Whole meds with liquid Supervision: Patient able to self feed Postural Changes and/or Swallow Maneuvers: Seated upright 90 degrees Oral Care Recommendations: Oral care BID Patient destination: Home Follow up Recommendations: Other (comment) (To be determined)    SLP Frequency 3 to 5 out of 7 days   SLP Duration  SLP Intensity  SLP Treatment/Interventions 5 to 7 days  Minumum of 1-2 x/day, 30 to 90 minutes  Cognitive remediation/compensation;Therapeutic Activities;Patient/family education;Functional tasks    Pain Pain Assessment Pain Assessment: No/denies pain Pain Score: 0-No pain  Prior Functioning Cognitive/Linguistic Baseline: Within functional limits Type of Home: Other(Comment) (Condo)  Lives With: Alone Available Help at Discharge: Family;Available PRN/intermittently Vocation: Full time employment  Function:   Cognition Comprehension Comprehension assist level: Follows complex conversation/direction with extra time/assistive device  Expression   Expression assist level: Expresses complex ideas: With extra time/assistive device  Social Interaction Social Interaction assist level: Interacts appropriately with others with medication or extra time (anti-anxiety, antidepressant).  Problem Solving Problem solving assist level: Solves complex problems: Recognizes & self-corrects  Memory  Memory assist level: Assistive device: No helper   Short Term Goals: Week 1: SLP Short Term Goal 1 (Week 1): Pt will utilize memory strategies and external adies with independence.  SLP Short Term Goal 2 (Week 1): Pt will demonstrate ability to complete complex problem solving tasks independently.  SLP Short Term Goal 3 (Week 1): Pt will complete complex math calculations independently.  SLP Short Term Goal 4 (Week 1): Pt will indepdently state how current physical deficits will impact his safetly within his home environment.  Refer to Care Plan for Long Term Goals  Recommendations for other services: None  Discharge Criteria: Patient will be discharged from SLP if patient refuses treatment 3 consecutive times without medical reason, if treatment goals not met, if there is a change in medical status, if patient makes no progress towards goals or if patient is discharged from hospital.  The above assessment, treatment plan, treatment alternatives and goals were discussed and mutually agreed upon: by patient  Adrinne Sze Rutherford Nail 09/25/2015, 3:39 PM

## 2015-09-25 NOTE — Progress Notes (Signed)
60 y.o. right handed male with history of hypertension, migraine headaches. Per chart review patient lives alone independently prior to admission. One level home 14 steps to entry. Patient works at [employer]. Presented with numerous ED evaluations for headaches. Placed on Depakote for headache prevention. Admitted 09/14/2015 with facial numbness and headache. MRI of the brain showed no acute intracranial process. 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 No emergent large vessel occlusion. 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 Underwent pipeline stenting 09/20/2015 per interventional radiology. EEG showed no seizure.. 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 Echocardiogram with ejection fraction of 65% no wall motion abnormalities  Subjective/Complaints: No complaints of facial tingling, Left hand is working better ROS No CP/SOB, N/V/D Objective: Vital Signs: Blood pressure 125/75, pulse 60, temperature 98.8 F (37.1 C), temperature source Oral, resp. rate 17, height _0  (1.727 m), weight 109.317 kg (241 lb), SpO2 96 %. No results found. Results for orders placed or performed during the hospital encounter of 09/23/15 (from the past 72 hour(s))  CBC WITH DIFFERENTIAL     Status: None   Collection Time: 09/24/15  7:05 AM  Result Value Ref Range   WBC 10.0 4.0 - 10.5 K/uL   RBC 4.71 4.22 - 5.81 MIL/uL   Hemoglobin 13.5 13.0 - 17.0 g/dL   HCT 40.7 39.0 - 52.0 %   MCV 86.4 78.0 - 100.0 fL   MCH 28.7 26.0 - 34.0 pg   MCHC 33.2 30.0 - 36.0 g/dL   RDW 13.6 11.5 - 15.5 %   Platelets 317 150 - 400 K/uL   Neutrophils Relative % 65 %   Neutro Abs 6.5 1.7 - 7.7 K/uL   Lymphocytes Relative 19 %   Lymphs Abs 1.9 0.7 - 4.0 K/uL   Monocytes Relative 9 %   Monocytes Absolute 0.9 0.1 - 1.0 K/uL   Eosinophils Relative 6 %   Eosinophils  Absolute 0.6 0.0 - 0.7 K/uL   Basophils Relative 1 %   Basophils Absolute 0.1 0.0 - 0.1 K/uL  Comprehensive metabolic panel     Status: Abnormal   Collection Time: 09/24/15  7:05 AM  Result Value Ref Range   Sodium 139 135 - 145 mmol/L   Potassium 3.5 3.5 - 5.1 mmol/L   Chloride 101 101 - 111 mmol/L   CO2 25 22 - 32 mmol/L   Glucose, Bld 83 65 - 99 mg/dL   BUN 15 6 - 20 mg/dL   Creatinine, Ser 1.02 0.61 - 1.24 mg/dL   Calcium 8.9 8.9 - 10.3 mg/dL   Total Protein 6.6 6.5 - 8.1 g/dL   Albumin 3.3 (L) 3.5 - 5.0 g/dL   AST 21 15 - 41 U/L   ALT 36 17 - 63 U/L   Alkaline Phosphatase 66 38 - 126 U/L   Total Bilirubin 0.7 0.3 - 1.2 mg/dL   GFR calc non Af Amer >60 >60 mL/min   GFR calc Af Amer >60 >60 mL/min    Comment: (NOTE) The eGFR has been calculated using the CKD EPI equation. This calculation has not been validated in all clinical situations. eGFR's persistently <60 mL/min signify possible Chronic Kidney Disease.    Anion gap 13 5 - 15  Valproic acid level     Status: None   Collection Time: 09/24/15  7:05 AM  Result Value Ref Range  Valproic Acid Lvl 86 50.0 - 100.0 ug/mL  Valproic acid level     Status: None   Collection Time: 09/25/15  6:39 AM  Result Value Ref Range   Valproic Acid Lvl 82 50.0 - 100.0 ug/mL     HEENT: twitching at corner of mouth on left Cardio: RRR and no murmur Resp: CTA B/L and unlabored GI: BS positive and NT, ND Extremity:  Pulses positive and No Edema Skin:   Intact Neuro: Alert/Oriented, Cranial Nerve Abnormalities Left central VII, Normal Sensory and Abnormal Motor 3- in Left hand intrinsics, finger flexion and ext, 4+ in Bi, tri Delt Musc/Skel:  Normal Gen NAD   Assessment/Plan: 1. 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 Physiatrist is providing close team supervision and 24 hour  management of active medical problems listed below. Physiatrist and rehab team continue to assess barriers to discharge/monitor patient progress toward functional and medical goals. FIM:          Function - Toilet Transfers Assist level to toilet: Supervision or verbal cues Assist level from toilet: Supervision or verbal cues  Function - Chair/bed transfer Chair/bed transfer method: Stand pivot Chair/bed transfer assist level: Touching or steadying assistance (Pt > 75%) Chair/bed transfer details: Verbal cues for technique, Verbal cues for precautions/safety  Function - Locomotion: Wheelchair Will patient use wheelchair at discharge?: No Type: Manual Max wheelchair distance: 179f Assist Level: Supervision or verbal cues Assist Level: Supervision or verbal cues Assist Level: Supervision or verbal cues Turns around,maneuvers to table,bed, and toilet,negotiates 3% grade,maneuvers on rugs and over doorsills: No Function - Locomotion: Ambulation Assistive device: No device Max distance: [id number] level: Touching or steadying assistance (Pt > 75%) Assist level: Touching or steadying assistance (Pt > 75%) Assist level: Touching or steadying assistance (Pt > 75%) Assist level: Touching or steadying assistance (Pt > 75%) Assist level: Touching or steadying assistance (Pt > 75%)  Function - Comprehension Comprehension: Auditory Comprehension assist level: Follows basic conversation/direction with extra time/assistive device  Function - Expression Expression: Verbal Expression assist level: Expresses basic 90% of the time/requires cueing < 10% of the time.  Function - Social Interaction Social Interaction assist level: Interacts appropriately 90% of the time - Needs monitoring or encouragement for participation or interaction.  Function - Problem Solving Problem solving assist level: Solves basic 90% of the time/requires cueing < 10% of the time  Function - Memory Memory  assist level: Requires cues to use assistive device Patient normally able to recall (first 3 days only): Current season, Location of own room, That he or she is in a hospital   Medical Problem List and Plan: 1.  Headache, facial and left arm numbness secondary to right MCA aneurysm status pipeline stenting/right MCA embolic infarct- Continue CIR 2.  DVT Prophylaxis/Anticoagulation: SCDs. Monitor for any signs of DVT 3. Pain Management/migraine headaches: Baclofen 10 mg daily at bedtime, Depakote 750 mg every 12 hours, Fioricet as needed 4. Mood. Lexapro 10 mg daily 5. Neuropsych: This patient is capable of making decisions on his own behalf. 6. Skin/Wound Care: Routine skin checks 7. Fluids/Electrolytes/Nutrition: Routine I&O's with follow-up chemistries 8. Hypertension. Norvasc 10 mg daily, chlorthalidone 25 mg daily, lisinopril 40 mg daily, Lopressor 100 mg twice a day. Monitor with increased mobility 9. Hyperlipidemia. Lipitor 10. Prediabetic. Hemoglobin A1c 6.1. Begin diabetic diet. No current plan to check blood sugars. Follow-up outpatient 11.  Left facial twitching Appreciate neurology note. Will keep Depakote level between 70 and 100. Depakote level 82 today. Continue Current dose LOS (Days) 2 A FACE TO FACE EVALUATION WAS PERFORMED  KIRSTEINS,ANDREW E 09/25/2015, 10:34 AM

## 2015-09-25 NOTE — Progress Notes (Signed)
Physical Therapy Session Note  Patient Details  Name: [name] MRN: [id number] Date of Birth: 01-01-1956  Today's Date: 09/25/2015 PT Individual Time: 1300-1400 PT Individual Time Calculation (min): 60 min   Short Term Goals: Week 1:  PT Short Term Goal 1 (Week 1): LTG = STG due to short LOS.   Skilled Therapeutic Interventions/Progress Updates:    Patient received supine in bed and agreeable to PT. Gait training in hall for 159ft without AD and 320ft x 2. Gait on uneven surface x 230ft including Gait outside and up/down 136ft ramp to parking garage. Throughout gait training patient did not utilize AD and PT provided Supervision A. Patient noted to have several minor LOB with head turns R>L that he was able to self correct without Assistance from PT.   Balance training Weave through 6 cones x 4. Step over 6 canes and 2 bolsters x 4. PT provided supervision A and min A x1 to prevent LOB while stepping over 6 inch bolster. Cues provided for increased concentration on task and improved step height.   SLS with toe taps on 1 cones x 12 and SLS with toe taps 2 of 3 cones in random order x 10 BLE.  Standing on airex pad reaching L and R to target x 12 each direction.  PT provided Supervision-min A for all balance training with cues for improved weight shift and increased ankle strategy to prevent LOB.   Patient returned to room and left supine in bed with call bell within reach.    Therapy Documentation Precautions:  Precautions Precautions: Fall Restrictions Weight Bearing Restrictions: No General:   Vital Signs: Therapy Vitals Temp: 97.9 F (36.6 C) Temp Source: Oral Pulse Rate: 67 Resp: 17 BP: (!) 110/57 mmHg Patient Position (if appropriate): Lying Oxygen Therapy SpO2: 97 % O2 Device: Not Delivered Pain:   Mobility:   Locomotion :    Trunk/Postural Assessment :    Balance:   Exercises:   Other Treatments:     See Function Navigator for Current Functional  Status.   Therapy/Group: Individual Therapy  Lorie Phenix 09/25/2015, 6:02 PM

## 2015-09-26 LAB — VALPROIC ACID LEVEL: Valproic Acid Lvl: 78 ug/mL (ref 50.0–100.0)

## 2015-09-26 NOTE — IPOC Note (Signed)
Overall Plan of Care [facility]) Patient Details Name: [name] MRN: [id number] DOB: 09-Jan-1956  Admitting Diagnosis: R MCA ANEURYSM  Hospital Problems: Principal Problem:   Gait disturbance, post-stroke Active Problems:   Middle cerebral aneurysm   Cerebral infarction due to embolism of right middle cerebral artery (HCC)   Complicated migraine   Anxiety state     Functional Problem List: Nursing Bladder, Medication Management, Pain, Safety  PT Balance, Behavior, Motor, Perception, Safety  OT Safety, Vision, Endurance, Motor  SLP Cognition  TR         Basic ADL's: OT Bathing, Dressing, Toileting     Advanced  ADL's: OT Simple Meal Preparation, Light Housekeeping     Transfers: PT Bed to Chair, Car, [employer], Floor  OT Toilet, Tub/Shower     Locomotion: PT Ambulation, Stairs     Additional Impairments: OT Fuctional Use of Upper Extremity  SLP        TR      Anticipated Outcomes Item Anticipated Outcome  Self Feeding Mod I  Swallowing      Basic self-care  Mod I  Toileting  Mod I   Bathroom Transfers Mod I  Bowel/Bladder  Decreased urinary urgency; no S/S infection, no retention.  Transfers  Mod I   Locomotion  Mod I   Communication     Cognition  Independent  Pain  Managed at 2/10.  Safety/Judgment  Increased safety awareness. No falls, injury this admission.   Therapy Plan: PT Intensity: Minimum of 1-2 x/day ,45 to 90 minutes PT Frequency: 5 out of 7 days PT Duration Estimated Length of Stay: 5-7 days.  OT Intensity: Minimum of 1-2 x/day, 45 to 90 minutes OT Frequency: 5 out of 7 days OT Duration/Estimated Length of Stay: 5-7 days SLP Intensity: Minumum of 1-2 x/day, 30 to 90 minutes SLP Frequency: 3 to 5 out of 7 days SLP Duration/Estimated Length of Stay: 5 to 7 days       Team Interventions: Nursing Interventions Patient/Family Education, Bladder Management, Disease Management/Prevention, Pain Management, Psychosocial Support  PT  interventions Ambulation/gait training, Training and development officer, Cognitive remediation/compensation, Community reintegration, Discharge planning, Disease management/prevention, DME/adaptive equipment instruction, Functional electrical stimulation, Functional mobility training, Neuromuscular re-education, Pain management, Patient/family education, Psychosocial support, Stair training, Splinting/orthotics, Therapeutic Activities, Therapeutic Exercise, Skin care/wound management, UE/LE Strength taining/ROM, UE/LE Coordination activities  OT Interventions Functional mobility training, Neuromuscular re-education, Therapeutic Activities, Therapeutic Exercise, UE/LE Coordination activities, UE/LE Strength taining/ROM, Patient/family education, Self Care/advanced ADL retraining, DME/adaptive equipment instruction, Discharge planning  SLP Interventions Cognitive remediation/compensation, Therapeutic Activities, Patient/family education, Functional tasks  TR Interventions    SW/CM Interventions Discharge Planning, Psychosocial Support, Patient/Family Education    Team Discharge Planning: Destination: PT-Home ,OT- Home , SLP-Home Projected Follow-up: PT-Home health PT, OT-  Outpatient OT, SLP-Other (comment) (To be determined) Projected Equipment Needs: PT-To be determined, OT- To be determined, SLP-  Equipment Details: PT- , OT-  Patient/family involved in discharge planning: PT- Patient,  OT-Patient, SLP-Patient  MD ELOS: 7-9d Medical Rehab Prognosis:  Excellent Assessment:   60 y.o. right handed male with history of hypertension, migraine headaches. Per chart review patient lives alone independently prior to admission. One level home 14 steps to entry. Patient works at [employer]. Presented with numerous ED evaluations for headaches. Placed on Depakote for headache prevention. Admitted 09/14/2015 with facial numbness and headache. MRI of the brain showed no acute intracranial  process. MRA showed an 8 x 6 mm saccular aneurysm to arise from left A2-3  junction. No emergent large vessel occlusion. 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 Underwent pipeline stenting 09/20/2015 per interventional radiology. EEG showed no seizure.. 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 Echocardiogram with ejection fraction of 65% no wall motion abnormalities     Now requiring 24/7 Rehab RN,MD, as well as CIR level PT, OT and SLP.  Treatment team will focus on ADLs and mobility with goals set at  [place]     See Team Conference Notes for weekly updates to the plan of care

## 2015-09-26 NOTE — Progress Notes (Signed)
60 y.o. right handed male with history of hypertension, migraine headaches. Per chart review patient lives alone independently prior to admission. One level home 14 steps to entry. Patient works at [employer]. Presented with numerous ED evaluations for headaches. Placed on Depakote for headache prevention. Admitted 09/14/2015 with facial numbness and headache. MRI of the brain showed no acute intracranial process. 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 No emergent large vessel occlusion. 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 Underwent pipeline stenting 09/20/2015 per interventional radiology. EEG showed no seizure.. 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 Echocardiogram with ejection fraction of 65% no wall motion abnormalities  Subjective/Complaints: No new issues overnight. Had a good day in therapy yesterday. ROS No CP/SOB, N/V/D Objective: Vital Signs: Blood pressure 112/65, pulse 61, temperature 98.5 F (36.9 C), temperature source Oral, resp. rate 18, height '5\' 8"'$  (1.727 m), weight 109.317 kg (241 lb), SpO2 97 %. No results found. Results for orders placed or performed during the hospital encounter of 09/23/15 (from the past 72 hour(s))  CBC WITH DIFFERENTIAL     Status: None   Collection Time: 09/24/15  7:05 AM  Result Value Ref Range   WBC 10.0 4.0 - 10.5 K/uL   RBC 4.71 4.22 - 5.81 MIL/uL   Hemoglobin 13.5 13.0 - 17.0 g/dL   HCT 40.7 39.0 - 52.0 %   MCV 86.4 78.0 - 100.0 fL   MCH 28.7 26.0 - 34.0 pg   MCHC 33.2 30.0 - 36.0 g/dL   RDW 13.6 11.5 - 15.5 %   Platelets 317 150 - 400 K/uL   Neutrophils Relative % 65 %   Neutro Abs 6.5 1.7 - 7.7 K/uL   Lymphocytes Relative 19 %   Lymphs Abs 1.9 0.7 - 4.0 K/uL   Monocytes Relative 9 %   Monocytes Absolute 0.9 0.1 - 1.0 K/uL   Eosinophils Relative 6 %   Eosinophils  Absolute 0.6 0.0 - 0.7 K/uL   Basophils Relative 1 %   Basophils Absolute 0.1 0.0 - 0.1 K/uL  Comprehensive metabolic panel     Status: Abnormal   Collection Time: 09/24/15  7:05 AM  Result Value Ref Range   Sodium 139 135 - 145 mmol/L   Potassium 3.5 3.5 - 5.1 mmol/L   Chloride 101 101 - 111 mmol/L   CO2 25 22 - 32 mmol/L   Glucose, Bld 83 65 - 99 mg/dL   BUN 15 6 - 20 mg/dL   Creatinine, Ser 1.02 0.61 - 1.24 mg/dL   Calcium 8.9 8.9 - 10.3 mg/dL   Total Protein 6.6 6.5 - 8.1 g/dL   Albumin 3.3 (L) 3.5 - 5.0 g/dL   AST 21 15 - 41 U/L   ALT 36 17 - 63 U/L   Alkaline Phosphatase 66 38 - 126 U/L   Total Bilirubin 0.7 0.3 - 1.2 mg/dL   GFR calc non Af Amer >60 >60 mL/min   GFR calc Af Amer >60 >60 mL/min    Comment: (NOTE) The eGFR has been calculated using the CKD EPI equation. This calculation has not been validated in all clinical situations. eGFR's persistently <60 mL/min signify possible Chronic Kidney Disease.    Anion gap 13 5 - 15  Valproic acid level     Status: None   Collection Time: 09/24/15  7:05 AM  Result Value Ref  Range   Valproic Acid Lvl 86 50.0 - 100.0 ug/mL  Valproic acid level     Status: None   Collection Time: 09/25/15  6:39 AM  Result Value Ref Range   Valproic Acid Lvl 82 50.0 - 100.0 ug/mL  Valproic acid level     Status: None   Collection Time: 09/26/15  7:14 AM  Result Value Ref Range   Valproic Acid Lvl 78 50.0 - 100.0 ug/mL     HEENT: twitching at corner of mouth on left Cardio: RRR and no murmur Resp: CTA B/L and unlabored GI: BS positive and NT, ND Extremity:  Pulses positive and No Edema Skin:   Intact Neuro: Alert/Oriented, Cranial Nerve Abnormalities Left central VII, Normal Sensory and Abnormal Motor 3- in Left hand intrinsics, finger flexion and ext, 4+ in Bi, tri Delt Musc/Skel:  Normal Gen NAD   Assessment/Plan: 1. 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 which require 3+ hours  per day of interdisciplinary therapy in a comprehensive inpatient rehab setting. Physiatrist is providing close team supervision and 24 hour management of active medical problems listed below. Physiatrist and rehab team continue to assess barriers to discharge/monitor patient progress toward functional and medical goals. FIM: Function - Bathing Position: Shower Body parts bathed by patient: Right arm, Left arm, Chest, Abdomen, Front perineal area, Buttocks, Right upper leg, Left upper leg, Right lower leg, Left lower leg Assist Level: Supervision or verbal cues  Function- Upper Body Dressing/Undressing What is the patient wearing?: Pull over shirt/dress Pull over shirt/dress - Perfomed by patient: Thread/unthread right sleeve, Put head through opening, Pull shirt over trunk, Thread/unthread left sleeve Assist Level: No help, No cues Function - Lower Body Dressing/Undressing What is the patient wearing?: Pants, Non-skid slipper socks Position: Wheelchair/chair at sink Pants- Performed by patient: Thread/unthread right pants leg, Pull pants up/down, Fasten/unfasten pants, Thread/unthread left pants leg Non-skid slipper socks- Performed by patient: Don/doff right sock, Don/doff left sock Assist for lower body dressing: Supervision or verbal cues, Set up  Function - Toileting Toileting steps completed by patient: Adjust clothing prior to toileting, Performs perineal hygiene, Adjust clothing after toileting Assist level: Supervision or verbal cues  Function Programmer, multimedia transfer assistive device: Grab bar Assist level to toilet: Supervision or verbal cues Assist level from toilet: Supervision or verbal cues  Function - Chair/bed transfer Chair/bed transfer method: Ambulatory Chair/bed transfer assist level: Supervision or verbal cues Chair/bed transfer details: Verbal cues for precautions/safety  Function - Locomotion: Wheelchair Will patient use wheelchair at discharge?:  No Type: Manual Max wheelchair distance: 169ft Assist Level: Supervision or verbal cues Assist Level: Supervision or verbal cues Assist Level: Supervision or verbal cues Turns around,maneuvers to table,bed, and toilet,negotiates 3% grade,maneuvers on rugs and over doorsills: No Function - Locomotion: Ambulation Assistive device: No device Max distance: 395ft Assist level: Supervision or verbal cues Assist level: Supervision or verbal cues Assist level: Supervision or verbal cues Assist level: Supervision or verbal cues Assist level: Supervision or verbal cues  Function - Comprehension Comprehension: Auditory Comprehension assist level: Follows basic conversation/direction with extra time/assistive device  Function - Expression Expression: Verbal Expression assist level: Expresses basic 90% of the time/requires cueing < 10% of the time.  Function - Social Interaction Social Interaction assist level: Interacts appropriately 90% of the time - Needs monitoring or encouragement for participation or interaction.  Function - Problem Solving Problem solving assist level: Solves basic 90% of the time/requires cueing < 10%  of the time  Function - Memory Memory assist level: Requires cues to use assistive device Patient normally able to recall (first 3 days only): Current season, Location of own room, That he or she is in a hospital   Medical Problem List and Plan: 1.  Headache, facial and left arm numbness secondary to right MCA aneurysm status pipeline stenting/right MCA embolic infarct- Continue CIR, PT, OT, speech therapy 2.  DVT Prophylaxis/Anticoagulation: SCDs. Monitor for any signs of DVT 3. Pain Management/migraine headaches: Baclofen 10 mg daily at bedtime, Depakote 750 mg every 12 hours, Fioricet as needed 4. Mood. Lexapro 10 mg daily 5. Neuropsych: This patient is capable of making decisions on his own behalf. 6. Skin/Wound Care: Routine skin checks 7.  Fluids/Electrolytes/Nutrition: Routine I&O's with follow-up chemistries 8. Hypertension. Norvasc 10 mg daily, chlorthalidone 25 mg daily, lisinopril 40 mg daily, Lopressor 100 mg twice a day. Monitor with increased mobility, We will cut back slightly on Lopressor due to mildly low BP and HR Filed Vitals:   09/26/15 0451 09/26/15 0832  BP: 107/57 112/65  Pulse: 56 61  Temp: 98.5 F (36.9 C)   Resp: 18    9. Hyperlipidemia. Lipitor 10. Prediabetic. Hemoglobin A1c 6.1. Begin diabetic diet. No current plan to check blood sugars. Follow-up outpatient 11.  Left facial twitching, Resolved Appreciate neurology note. Will keep Depakote level between 70 and 100. Depakote level 82 today. Continue Current dose, LOS (Days) 3 A FACE TO FACE EVALUATION WAS PERFORMED  KIRSTEINS,ANDREW E 09/26/2015, 10:55 AM

## 2015-09-27 ENCOUNTER — Inpatient Hospital Stay (HOSPITAL_COMMUNITY): Payer: Managed Care, Other (non HMO)

## 2015-09-27 ENCOUNTER — Inpatient Hospital Stay (HOSPITAL_COMMUNITY): Payer: Managed Care, Other (non HMO) | Admitting: Speech Pathology

## 2015-09-27 ENCOUNTER — Encounter (HOSPITAL_COMMUNITY): Payer: Managed Care, Other (non HMO)

## 2015-09-27 ENCOUNTER — Inpatient Hospital Stay (HOSPITAL_COMMUNITY): Payer: Managed Care, Other (non HMO) | Admitting: Occupational Therapy

## 2015-09-27 ENCOUNTER — Inpatient Hospital Stay (HOSPITAL_COMMUNITY): Payer: Managed Care, Other (non HMO) | Admitting: Physical Therapy

## 2015-09-27 LAB — VALPROIC ACID LEVEL: VALPROIC ACID LVL: 77 ug/mL (ref 50.0–100.0)

## 2015-09-27 MED ORDER — METOPROLOL TARTRATE 50 MG PO TABS
50.0000 mg | ORAL_TABLET | Freq: Two times a day (BID) | ORAL | Status: DC
  Administered 2015-09-27 – 2015-09-30 (×7): 50 mg via ORAL
  Filled 2015-09-27 (×6): qty 1

## 2015-09-27 NOTE — Progress Notes (Signed)
Physical Therapy Session Note  Patient Details  Name: [name] MRN: [id number] Date of Birth: 07/11/1955  Today's Date: 09/27/2015 PT Individual Time: 1101-1201 PT Individual Time Calculation (min): 60 min   Short Term Goals: Week 1:  PT Short Term Goal 1 (Week 1): LTG = STG due to short LOS.   Skilled Therapeutic Interventions/Progress Updates:    Pt received in hand off from OT & pt agreeable to PT session. Pt negotiated stairs with B rails 4 steps x 4 consecutive trials & 4 steps x 2 trials with only 1 rail to simulate pt's home environment. Pt able to negotiate stairs with reciprocal pattern & Mod I. Pt ambulated from main gym to [place] where dynavision was used to address pt's impaired peripheral vision. Pt able to complete 1 minute trials x 2 as well as endurance trials for 4 minutes x 2 alternating arms. Pt with almost equal reaction time to all quadrants with Both UE's. Pt did require rest breaks after 4 minute trials 2/2 fatigue & SOB; PT provided cuing for pursed lip breathing during activity. PT then placed small objects in day room, then hallway, and PT ambulated throughout rooms with scanning environment to locate objects. Pt continues to have difficulty locating objects on his left side but is more aware of decreased peripheral vision. Pt then performed standing side kicks to both sides with supervision for endurance training. At end of session pt returned to room & left in w/c with all needs within reach.  During session pt required supervision for all ambulation 2/2 intermittent lateral sway to L.   Therapy Documentation Precautions:  Precautions Precautions: Fall Restrictions Weight Bearing Restrictions: No   See Function Navigator for Current Functional Status.   Therapy/Group: Individual Therapy  [name] 09/27/2015, 12:08 PM

## 2015-09-27 NOTE — Progress Notes (Signed)
Occupational Therapy Session Note  Patient Details  Name: [name] MRN: [id number] Date of Birth: 1955/08/31  Today's Date: 09/27/2015 OT Individual Time: 1000-1100 OT Individual Time Calculation (min): 60 min    Short Term Goals: Week 1:  OT Short Term Goal 1 (Week 1): STG=LTG d/t short LOS  Skilled Therapeutic Interventions/Progress Updates:    Pt seen this session to work on dynamic balance activities. Pt reported that he showered and dressed early this morning with nurse tech in the room. Pt ambulated to the gym and worked on a variety of standing exercises including side/lateral lunges with wt shifts, balancing on one leg with forward kicking, sit ><stand with holding large ball and reaching overhead. Standing in open squat practicing karate arm blocks. Holding ball over head with lateral side bends.  Picking up and lifting large bolster overhead multiple times to prepare for vocational activities. Pt with no LOB, but did need to sit and rest after just a few minutes of exercise. Discussed pt's progress with strength. His L grip is now 94lb (vs 95 on the R).   Pt's PT arrived for his next session.  Therapy Documentation Precautions:  Precautions Precautions: Fall Restrictions Weight Bearing Restrictions: No     Pain: Pain Assessment Pain Assessment:  (mod headache, reported to nursing) ADL:  See Function Navigator for Current Functional Status.   Therapy/Group: Individual Therapy  [place] 09/27/2015, 1:00 PM

## 2015-09-27 NOTE — Progress Notes (Signed)
Speech Language Pathology Daily Session Note  Patient Details  Name: [name] MRN: [id number] Date of Birth: [birth year]  Today's Date: 09/27/2015 SLP Individual Time: 1345-1415 SLP Individual Time Calculation (min): 30 min  Short Term Goals: Week 1: SLP Short Term Goal 1 (Week 1): Pt will utilize memory strategies and external adies with independence.  SLP Short Term Goal 2 (Week 1): Pt will demonstrate ability to complete complex problem solving tasks independently.  SLP Short Term Goal 3 (Week 1): Pt will complete complex math calculations independently.  SLP Short Term Goal 4 (Week 1): Pt will indepdently state how current physical deficits will impact his safetly within his home environment.  Skilled Therapeutic Interventions: Skilled treatment session focused on cognitive goals. SLP facilitated session by providing supervision question and verbal cues for recall of his current medications and their functions. Will organize a 4 time per day pill box at next session to facilitate safety and recall with current medications. Patient also recalled current deficits and verbalized examples of how they will impact his safety at home and at work with Mod I. Patient left sitting EOB with family present. Continue with current plan of care.    Function:   Cognition Comprehension Comprehension assist level: Understands complex 90% of the time/cues 10% of the time  Expression   Expression assist level: Expresses complex 90% of the time/cues < 10% of the time  Social Interaction Social Interaction assist level: Interacts appropriately with others with medication or extra time (anti-anxiety, antidepressant).  Problem Solving Problem solving assist level: Solves complex 90% of the time/cues < 10% of the time  Memory Memory assist level: Requires cues to use assistive device    Pain Pain Assessment Pain Assessment: No/denies pain  Therapy/Group: Individual Therapy  Emira Eubanks,  Helyn Schwan 09/27/2015, 3:12 PM

## 2015-09-27 NOTE — Progress Notes (Signed)
Occupational Therapy Session Note  Patient Details  Name: [name] MRN: [id number] Date of Birth: Dec 09, 1955  Today's Date: 09/27/2015 OT Individual Time: 1300-1345 OT Individual Time Calculation (min): 45 min    Short Term Goals: Week 1:  OT Short Term Goal 1 (Week 1): STG=LTG d/t short LOS  Skilled Therapeutic Interventions/Progress Updates: Therapeutic activity with focus on general condition, functional mobility, dynamic standing balance, left attention and safety awareness during performance of home making (simple meal prep).    Pt ambulates to gym pushing his w/c approx 350' with min vc to attend to traffic in hallway.   Pt performs 10 of therex seated using NuStep (Levels 6-10); pt requests to perform max effort at last 30 seconds and maintain rate of 85 steps/min @ level 10 resistance.   HR 95 bpm immediately during recovery, bp 169/76; HR recovers to 86 bpm within 2 min and pt resumes activity.   Pt ambulates while dribbling basket ball but requires min vc to reduce intensity and pace d/t limited awareness of gait impairment (scissor gait) which increases when pt fatigues.    After brief rest break seated in w/c, pt is directed to kitchen and instructed on therapeutic intent of simple meal prep.   Pt completes task by frying an egg on stovetop.   Pt gathers all supplies and completes task while standing w/o need for cues after orientation to kitchen.   Pt accesses refrigerator, stove, countertop, cabinets w/o evidence of LOB.   Pt then ambulates back to his room where his brother has arrived for visiting at end of session.     Therapy Documentation Precautions:  Precautions Precautions: Fall Restrictions Weight Bearing Restrictions: No  Pain: Pain Assessment Pain Assessment: No/denies pain  Exercises: Cardiovascular Exercises NuStep: 10 minutes, level 6-10, 60 steps/min rate  See Function Navigator for Current Functional Status.   Therapy/Group: Individual  Therapy  [place] 09/27/2015, 2:03 PM

## 2015-09-27 NOTE — Progress Notes (Signed)
Subjective/Complaints: Patient finished with neuropsychology consult. No new issues overnight. Bowel and bladder doing well. ROS No CP/SOB, N/V/D Objective: Vital Signs: Blood pressure 110/50, pulse 53, temperature 98.5 F (36.9 C), temperature source Oral, resp. rate 18, height 5\' 8"  (1.727 m), weight 109.317 kg (241 lb), SpO2 97 %. No results found. Results for orders placed or performed during the hospital encounter of 09/23/15 (from the past 72 hour(s))  Valproic acid level     Status: None   Collection Time: 09/25/15  6:39 AM  Result Value Ref Range   Valproic Acid Lvl 82 50.0 - 100.0 ug/mL  Valproic acid level     Status: None   Collection Time: 09/26/15  7:14 AM  Result Value Ref Range   Valproic Acid Lvl 78 50.0 - 100.0 ug/mL  Valproic acid level     Status: None   Collection Time: 09/27/15  5:41 AM  Result Value Ref Range   Valproic Acid Lvl 77 50.0 - 100.0 ug/mL     HEENT: twitching at corner of mouth on left Cardio: RRR and no murmur Resp: CTA B/L and unlabored GI: BS positive and NT, ND Extremity:  Pulses positive and No Edema Skin:   Intact Neuro: Alert/Oriented, Cranial Nerve Abnormalities Left central VII, Normal Sensory and Abnormal Motor 3- in Left hand intrinsics, finger flexion and ext, 4+ in Bi, tri Delt Musc/Skel:  Normal Gen NAD   Assessment/Plan: 1. 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 Physiatrist is providing close team supervision and 24 hour management of active medical problems listed below. Physiatrist and rehab team continue to assess barriers to discharge/monitor patient progress toward functional and medical goals. FIM: Function - Bathing Position: Shower Body parts bathed by patient: Right arm, Left arm, Chest, Abdomen, Front perineal area, Buttocks, Right upper leg, Left upper leg,  Right lower leg, Left lower leg Assist Level: Supervision or verbal cues  Function- Upper Body Dressing/Undressing What is the patient wearing?: Pull over shirt/dress Pull over shirt/dress - Perfomed by patient: Thread/unthread right sleeve, Put head through opening, Pull shirt over trunk, Thread/unthread left sleeve Assist Level: No help, No cues Function - Lower Body Dressing/Undressing What is the patient wearing?: Pants, Non-skid slipper socks Position: Wheelchair/chair at sink Pants- Performed by patient: Thread/unthread right pants leg, Pull pants up/down, Fasten/unfasten pants, Thread/unthread left pants leg Non-skid slipper socks- Performed by patient: Don/doff right sock, Don/doff left sock Assist for lower body dressing: Supervision or verbal cues, Set up  Function - Toileting Toileting steps completed by patient: Adjust clothing prior to toileting, Performs perineal hygiene, Adjust clothing after toileting Assist level: Supervision or verbal cues  Function Midwife transfer assistive device: Grab bar Assist level to toilet: No Help, No cues Assist level from toilet: No Help, No cues  Function - Chair/bed transfer Chair/bed transfer method: Ambulatory Chair/bed transfer assist level: Supervision or verbal cues Chair/bed transfer details: Verbal cues for precautions/safety  Function - Locomotion: Wheelchair Will patient use wheelchair at discharge?: No Type: Manual Max wheelchair distance: 125ft Assist Level: Supervision or verbal cues Assist Level: Supervision or verbal cues Assist Level: Supervision or verbal cues Turns around,maneuvers to table,bed, and toilet,negotiates 3% grade,maneuvers on rugs and over doorsills: No Function - Locomotion: Ambulation Assistive device: No device Max distance: 332ft Assist level: Supervision or verbal cues Assist level: Supervision or verbal cues Assist level: Supervision or  verbal cues Assist level: Supervision  or verbal cues Assist level: Supervision or verbal cues  Function - Comprehension Comprehension: Auditory Comprehension assist level: Follows basic conversation/direction with extra time/assistive device  Function - Expression Expression: Verbal Expression assist level: Expresses basic 90% of the time/requires cueing < 10% of the time.  Function - Social Interaction Social Interaction assist level: Interacts appropriately 90% of the time - Needs monitoring or encouragement for participation or interaction.  Function - Problem Solving Problem solving assist level: Solves basic 90% of the time/requires cueing < 10% of the time  Function - Memory Memory assist level: Requires cues to use assistive device Patient normally able to recall (first 3 days only): Current season, Location of own room, That he or she is in a hospital   Medical Problem List and Plan: 1.  Headache, facial and left arm numbness secondary to right MCA aneurysm status pipeline stenting/right MCA embolic infarct- Continue CIR, PT, OT, speech therapy 2.  DVT Prophylaxis/Anticoagulation: SCDs. Monitor for any signs of DVT 3. Pain Management/migraine headaches: Baclofen 10 mg daily we'll discontinue this, Depakote 750 mg every 12 hours, Fioricet as needed 4. Mood. Lexapro 10 mg daily 5. Neuropsych: This patient is capable of making decisions on his own behalf. 6. Skin/Wound Care: Routine skin checks 7. Fluids/Electrolytes/Nutrition: Routine I&O's with follow-up chemistries 8. Hypertension. Norvasc 10 mg daily, chlorthalidone 25 mg daily, lisinopril 40 mg daily, Lopressor 100 mg twice a day. Monitor with increased mobility, We will reduced Lopressor to 50mg  BID due to mildly low BP and HR Filed Vitals:   09/26/15 1337 09/27/15 0614  BP: 103/51 110/50  Pulse: 62 53  Temp: 98 F (36.7 C) 98.5 F (36.9 C)  Resp: 18 18   9. Hyperlipidemia. Lipitor 10. Prediabetic. Hemoglobin A1c 6.1. Begin diabetic diet. No current  plan to check blood sugars. Follow-up outpatient 11.  Left facial twitching, Resolved Appreciate neurology note. Will keep Depakote level between 70 and 100. Depakote level 77,. Continue Current dose, LOS (Days) 4 A FACE TO FACE EVALUATION WAS PERFORMED  KIRSTEINS,ANDREW E 09/27/2015, 9:11 AM

## 2015-09-28 ENCOUNTER — Inpatient Hospital Stay (HOSPITAL_COMMUNITY): Payer: Managed Care, Other (non HMO) | Admitting: Physical Therapy

## 2015-09-28 ENCOUNTER — Inpatient Hospital Stay (HOSPITAL_COMMUNITY): Payer: Managed Care, Other (non HMO)

## 2015-09-28 ENCOUNTER — Inpatient Hospital Stay (HOSPITAL_COMMUNITY): Payer: Managed Care, Other (non HMO) | Admitting: Speech Pathology

## 2015-09-28 MED ORDER — SELENIUM SULFIDE 1 % EX LOTN
TOPICAL_LOTION | Freq: Every day | CUTANEOUS | Status: DC
  Administered 2015-09-28: 09:00:00 via TOPICAL
  Filled 2015-09-28: qty 207

## 2015-09-28 NOTE — Progress Notes (Signed)
Pt requesting a hall pass to visit family members on 6N and 2C. [place] pass authorized by Silvestre Mesi, PA for patient to visit family member without staff escort. Pt escorted to family's room during therapy session with Letta Moynahan, PT, and therapist felt that pt would be okay to visit family member w/o staff. Staff to provide unit telephone number in the event that patient required directional or other assistance during off unit visit.

## 2015-09-28 NOTE — Progress Notes (Signed)
Occupational Therapy Session Note  Patient Details  Name: [name] MRN: [id number] Date of Birth: 1955-10-24  Today's Date: 09/28/2015 OT Individual Time: 0845-1000 OT Individual Time Calculation (min): 75 min    Short Term Goals: Week 1:  OT Short Term Goal 1 (Week 1): STG=LTG d/t short LOS  Skilled Therapeutic Interventions/Progress Updates: ADL-retraining (45 min) at shower level with focus on improved dynamic standing balance, functional mobility, anticipatory awareness, and problem-solving.   Pt able to gather his clothing independently, and shower standing with only setup and supervision for safety.   Pt required supervision to educate on use of new bathroom since pt moved to new room.   Pt dressed at bench placed outside of shower d/t his preference to stand with adequate space to move while showering.   Pt returned to chair at sink and groomed unassisted.   Therapeutic activity (30 min) with focus on dynamic standing balance and weight-shifting using Nintendo Wii and Wii-Fit.   Pt completed challenge with improved awareness of his BMI (36) compared to optimal BMI of 22, requiring loss of approx 100 lbs body mass.   Pt demo'd mild confusion with use of device and exhibited excess compensations to move objects via balance board but his performance improved as session progressed.   Pt ambulated back to his room.   OT educated pt on planned outing and use of pt wash/dryer, recommended for alternate sessions.     Therapy Documentation Precautions:  Precautions Precautions: Fall Restrictions Weight Bearing Restrictions: No  Vital Signs: Therapy Vitals Pulse Rate: 68 BP: 128/68 mmHg   Pain: Pain Assessment Pain Assessment: No/denies pain  See Function Navigator for Current Functional Status.   Therapy/Group: Individual Therapy  [place] 09/28/2015, 11:50 AM
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
## 2015-09-28 NOTE — Progress Notes (Signed)
Speech Language Pathology Daily Session Note  Patient Details  Name: [name] MRN: [id number] Date of Birth: [birth year]  Today's Date: 09/28/2015 SLP Individual Time: 1315-1400 SLP Individual Time Calculation (min): 45 min  Short Term Goals: Week 1: SLP Short Term Goal 1 (Week 1): Pt will utilize memory strategies and external adies with independence.  SLP Short Term Goal 2 (Week 1): Pt will demonstrate ability to complete complex problem solving tasks independently.  SLP Short Term Goal 3 (Week 1): Pt will complete complex math calculations independently.  SLP Short Term Goal 4 (Week 1): Pt will indepdently state how current physical deficits will impact his safetly within his home environment.  Skilled Therapeutic Interventions:  Pt was seen for skilled ST targeting cognitive goals.  Session began late due to pt having a conference with nursing director about concerns related to his care.  Per report, pt was upset with staff members about not being allowed to ambulate around his room without assistance or to leave the unit without a staff member present.  Pt was amenable to participating in [place] with encouragement.  SLP facilitated the session with a medication management task targeting complex problem solving.  Pt organized pills into a 4x/day pill box for 100% accuracy with supervision verbal cues which SLP was able to fade to mod I following initial orientation to pill box.  Pt was left sitting at edge of bed with call bell within reach.  Continue per current plan of care.    Function:  Eating Eating                 Cognition Comprehension Comprehension assist level: Understands complex 90% of the time/cues 10% of the time  Expression   Expression assist level: Expresses complex 90% of the time/cues < 10% of the time  Social Interaction Social Interaction assist level: Interacts appropriately with others with medication or extra time (anti-anxiety, antidepressant).  Problem  Solving Problem solving assist level: Solves complex 90% of the time/cues < 10% of the time  Memory Memory assist level: Recognizes or recalls 90% of the time/requires cueing < 10% of the time    Pain Pain Assessment Pain Assessment: 0-10 Pain Score: 6  Pain Location: Head Pain Orientation: Mid Pain Descriptors / Indicators: Headache Patients Stated Pain Goal: 4 Pain Intervention(s): RN made aware  Therapy/Group: Individual Therapy  [name], Selinda Orion 09/28/2015, 3:55 PM

## 2015-09-28 NOTE — Plan of Care (Signed)
Problem: RH Balance Goal: LTG Patient will maintain dynamic standing balance (PT) LTG: Patient will maintain dynamic standing balance with assistance during mobility activities (PT)  Upgraded 2/2 pt progress     

## 2015-09-28 NOTE — Progress Notes (Signed)
Social Work Patient ID: [name], male   DOB: 02/18/1956, 60 y.o.   MRN: [id number] Team feels pt will be ready for discharge on Thursday checking with the MD. Will work on discharge needs.

## 2015-09-28 NOTE — Plan of Care (Signed)
Problem: RH Ambulation Goal: LTG Patient will ambulate in controlled environment (PT) LTG: Patient will ambulate in a controlled environment, # of feet with assistance (PT).  150 ft with LRAD; upgraded 2/2 progress Goal: LTG Patient will ambulate in community environment (PT) LTG: Patient will ambulate in community environment, # of feet with assistance (PT).  150 ft with LRAD; upgraded 2/2 pt progress     Problem: RH Stairs Goal: LTG Patient will ambulate up and down stairs w/assist (PT) LTG: Patient will ambulate up and down # of stairs with assistance (PT)  14 steps with B rails & 3 steps with 1 rail; upgraded 2/2 pt progress

## 2015-09-28 NOTE — Progress Notes (Signed)
Subjective/Complaints: Pt c/o dandruff requests shampoo ROS No CP/SOB, N/V/D Objective: Vital Signs: Blood pressure 118/63, pulse 57, temperature 98.2 F (36.8 C), temperature source Oral, resp. rate 18, height 5\' 8"  (1.727 m), weight 109.317 kg (241 lb), SpO2 97 %. No results found. Results for orders placed or performed during the hospital encounter of 09/23/15 (from the past 72 hour(s))  Valproic acid level     Status: None   Collection Time: 09/26/15  7:14 AM  Result Value Ref Range   Valproic Acid Lvl 78 50.0 - 100.0 ug/mL  Valproic acid level     Status: None   Collection Time: 09/27/15  5:41 AM  Result Value Ref Range   Valproic Acid Lvl 77 50.0 - 100.0 ug/mL     HEENT: twitching at corner of mouth on left Cardio: RRR and no murmur Resp: CTA B/L and unlabored GI: BS positive and NT, ND Extremity:  Pulses positive and No Edema Skin:   Intact Neuro: Alert/Oriented, Cranial Nerve Abnormalities Left central VII, Normal Sensory and Abnormal Motor 3- in Left hand intrinsics, finger flexion and ext, 4+ in Bi, tri Delt Musc/Skel:  Normal Gen NAD   Assessment/Plan: 1. 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 Physiatrist is providing close team supervision and 24 hour management of active medical problems listed below. Physiatrist and rehab team continue to assess barriers to discharge/monitor patient progress toward functional and medical goals. FIM: Function - Bathing Position: Shower Body parts bathed by patient: Right arm, Left arm, Chest, Abdomen, Front perineal area, Buttocks, Right upper leg, Left upper leg, Right lower leg, Left lower leg Assist Level: Supervision or verbal cues  Function- Upper Body Dressing/Undressing What is the patient wearing?: Pull over shirt/dress Pull over shirt/dress - Perfomed by patient:  Thread/unthread right sleeve, Put head through opening, Pull shirt over trunk, Thread/unthread left sleeve Assist Level: No help, No cues Function - Lower Body Dressing/Undressing What is the patient wearing?: Pants, Non-skid slipper socks Position: Wheelchair/chair at sink Pants- Performed by patient: Thread/unthread right pants leg, Pull pants up/down, Fasten/unfasten pants, Thread/unthread left pants leg Non-skid slipper socks- Performed by patient: Don/doff right sock, Don/doff left sock Assist for footwear: Supervision/touching assist Assist for lower body dressing: Supervision or verbal cues, Set up  Function - Toileting Toileting steps completed by patient: Adjust clothing prior to toileting, Performs perineal hygiene, Adjust clothing after toileting Assist level: Supervision or verbal cues  Function - Air cabin crew transfer assistive device: Grab bar Assist level to toilet: No Help, No cues Assist level from toilet: No Help, No cues  Function - Chair/bed transfer Chair/bed transfer method: Ambulatory Chair/bed transfer assist level: Supervision or verbal cues Chair/bed transfer details: Verbal cues for precautions/safety  Function - Locomotion: Wheelchair Will patient use wheelchair at discharge?: No Type: Manual Max wheelchair distance: 121ft Assist Level: Supervision or verbal cues Assist Level: Supervision or verbal cues Assist Level: Supervision or verbal cues Turns around,maneuvers to table,bed, and toilet,negotiates 3% grade,maneuvers on rugs and over doorsills: No Function - Locomotion: Ambulation Assistive device: No device Max distance: 200 ft Assist level: Supervision or verbal cues Assist level: Supervision or verbal cues Assist level: Supervision or verbal cues Assist level: Supervision or verbal cues Assist level: Supervision or verbal cues  Function - Comprehension Comprehension: Auditory Comprehension assist level: Understands complex 90% of  the time/cues 10% of the time  Function -  Expression Expression: Verbal Expression assist level: Expresses complex 90% of the time/cues < 10% of the time  Function - Social Interaction Social Interaction assist level: Interacts appropriately with others with medication or extra time (anti-anxiety, antidepressant).  Function - Problem Solving Problem solving assist level: Solves basic 90% of the time/requires cueing < 10% of the time  Function - Memory Memory assist level: Requires cues to use assistive device Patient normally able to recall (first 3 days only): Current season, Location of own room, That he or she is in a hospital   Medical Problem List and Plan: 1.  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  DVT Prophylaxis/Anticoagulation: SCDs. Monitor for any signs of DVT 3. Pain Management/migraine headaches: D.C. Baclofen , Depakote 750 mg every 12 hours, Fioricet as needed 4. Mood. Lexapro 10 mg daily 5. Neuropsych: This patient is capable of making decisions on his own behalf. 6. Skin/Wound Care: Routine skin checks 7. Fluids/Electrolytes/Nutrition: Routine I&O's with follow-up chemistries 8. Hypertension. Norvasc 10 mg daily, chlorthalidone 25 mg daily, lisinopril 40 mg daily,  Monitor with increased mobility,reduced Lopressor to 50mg  BID due to mildly low BP and HR on 5/1 monitor Filed Vitals:   09/27/15 2113 09/28/15 0556  BP: 140/66 118/63  Pulse: 71 57  Temp: 98.2 F (36.8 C) 98.2 F (36.8 C)  Resp: 18 18   9. Hyperlipidemia. Lipitor 10. Prediabetic. Hemoglobin A1c 6.1. Begin diabetic diet. No current plan to check blood sugars. Follow-up outpatient 11.  Left facial twitching, Resolved Appreciate neurology note. Will keep Depakote level between 70 and 100. Depakote level stable at 77,. Continue Current dose,Will reduce Valproate level to weekly LOS (Days) 5 A FACE TO  FACE EVALUATION WAS PERFORMED  KIRSTEINS,ANDREW E 09/28/2015, 6:56 AM
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
## 2015-09-28 NOTE — Progress Notes (Signed)
Physical Therapy Session Note  Patient Details  Name: [name] MRN: [id number] Date of Birth: 1956-05-13  Today's Date: 09/28/2015 PT Individual Time: 1110-1201 PT Individual Time Calculation (min): 51 min   Short Term Goals: Week 1:  PT Short Term Goal 1 (Week 1): LTG = STG due to short LOS.   Skilled Therapeutic Interventions/Progress Updates:    Pt received in handoff from previous PT & pt agreeable to treatment with this therapist. Pt performed car transfer with pt set at low sports car height and at truck height with mod I for both. Pt able to step in with LE & pull himself up into elevated truck seat, reporting that is how he normally does task & pt safe with doing so. PT educated pt on falling at home, as well as when he should call EMS afterwards. Pt able to demonstrate floor transfer with and without furniture support and pt with mod I with both methods. Pt then ambulated off of unit to gift shop & outside. Pt recognizes that he still needs to scan L peripheral by turning head but still forgets on occasion. Pt able to ambulate over grass without AD nor LOB; pt tolerated ambulating off of unit & back to unit without seated rest break & no c/o SOB. Pt performed toileting in room with mod I. Utilized rocker board with lateral challenges to balance with pt's eyes open & closed & pt able to self correct, sometimes requiring use of BUE's. At end of session pt ambulated back to room & left in recliner with all needs within reach. PT educated pt on recommendation for Outpatient PT follow up after d/c but pt does not seem to have transportation to appointments; notified CSW of PT recommendations.  Therapy Documentation Precautions:  Precautions Precautions: Fall Restrictions Weight Bearing Restrictions: No  Pain: Pain Assessment Pain Assessment: No/denies pain   See Function Navigator for Current Functional Status.   Therapy/Group: Individual Therapy  [name] 09/28/2015,  7:52 AM

## 2015-09-29 ENCOUNTER — Encounter (HOSPITAL_COMMUNITY): Payer: Managed Care, Other (non HMO) | Admitting: Physical Therapy

## 2015-09-29 ENCOUNTER — Inpatient Hospital Stay (HOSPITAL_COMMUNITY): Payer: Managed Care, Other (non HMO) | Admitting: Physical Therapy

## 2015-09-29 ENCOUNTER — Inpatient Hospital Stay (HOSPITAL_COMMUNITY): Payer: Managed Care, Other (non HMO) | Admitting: Speech Pathology

## 2015-09-29 ENCOUNTER — Inpatient Hospital Stay (HOSPITAL_COMMUNITY): Payer: Managed Care, Other (non HMO) | Admitting: Occupational Therapy

## 2015-09-29 NOTE — Progress Notes (Signed)
Occupational Therapy Discharge Summary  Patient Details  Name: [name] MRN: [id number] Date of Birth: [birth year]  Today's Date: 09/29/2015 OT Individual Time: 0800-0900 OT Individual Time Calculation (min): 60 min   1:1 GRAD DAY- Pt completed bathing, dressing and grooming at mod I level prior to therapist arrival. Continued to discuss d/c plans, recommendations for home safety, visual scanning to compensate for visual disturbance on left side, activity tolerance, and way to continue to build up endurance/ activity tolerance at home. Pt ambulated outside around the grounds of [place] with supervision to mod I. Pt does drift to either side with even terrain and uneven surfaces. Pt able to compensate for peripheral vision mod I. Pt with improved left hand coordination and strength for basic ADL tasks. Pt able to climb 3 flights of stairs without rest with hand rail on the right, at a steady pace. Pt able to decent 2 flights with extra time.  Return to room without cues.    Patient has met 15 of 15 long term goals due to improved activity tolerance, improved balance, postural control, ability to compensate for deficits, functional use of  LEFT upper and LEFT lower extremity and improved coordination.  Patient to discharge at overall Modified Independent level to complete BADLs and basic IADLs.  Pt continues to have intermittent diplopia and left peripheral loss in left field.  Patient's care partner pt's daughter to provide the necessary with IADLs prn assistance at discharge.    Reasons goals not met: n/a  Recommendation:  Patient will benefit from ongoing skilled OT services in home health setting to continue to advance functional skills in the area of iADL and Reduce care partner burden.  Equipment: No equipment provided  Reasons for discharge: treatment goals met and discharge from hospital  Patient/family agrees with progress made and goals achieved: Yes  OT  Discharge Precautions/Restrictions  Restrictions Weight Bearing Restrictions: No    Vital Signs Therapy Vitals Temp: 98.3 F (36.8 C) Temp Source: Oral Pulse Rate: 66 Resp: 18 BP: (!) 114/57 mmHg Patient Position (if appropriate): Lying Oxygen Therapy SpO2: 98 % O2 Device: Not Delivered Pain Pain Assessment Pain Assessment: No/denies pain ADL   Vision/Perception  Vision- History Baseline Vision/History:  (contacts) Wears Glasses: At all times Patient Visual Report: Peripheral vision impairment;Blurring of vision Vision- Assessment Vision Assessment?: Vision impaired- to be further tested in functional context Eye Alignment: Impaired (comment) (to the left) Ocular Range of Motion: Within Functional Limits Alignment/Gaze Preference: Head turned;Head tilt Tracking/Visual Pursuits: Able to track stimulus in all quads without difficulty Saccades: Decreased speed of saccadic movement Convergence: Impaired (comment) Visual Fields: Left visual field deficit Additional Comments: Diplopia in left field - inconsistently- improving with time - home exercise given  Cognition Arousal/Alertness: Awake/alert Orientation Level: Oriented X4 Attention: Alternating Sustained Attention: Appears intact Alternating Attention: Appears intact Memory: Appears intact Awareness: Appears intact Problem Solving: Appears intact Safety/Judgment: Appears intact Sensation Sensation Light Touch Impaired Details: Impaired LUE (numbness in palm - inconsistent through day) Stereognosis: Appears Intact Hot/Cold: Appears Intact Proprioception: Appears Intact Coordination Gross Motor Movements are Fluid and Coordinated: Yes Fine Motor Movements are Fluid and Coordinated: Yes Finger Nose Finger Test: Decreased speed of movements on the LUE - but improved Motor  Motor Motor - Discharge Observations: improved hemiparesis and coordination Mobility  Bed Mobility Rolling Right: 6: Modified  independent (Device/Increase time) Rolling Left: 6: Modified independent (Device/Increase time) Sit to Supine: 6: Modified independent (Device/Increase time) Transfers Transfers: Sit to Stand;Stand to Sit  Sit to Stand: 6: Modified independent (Device/Increase time)  Trunk/Postural Assessment  Cervical AROM Overall Cervical AROM Comments: Has had chiropractic manipulation of c-spine, possible OA Thoracic Assessment Thoracic Assessment: Within Functional Limits Lumbar Assessment Lumbar Assessment: Within Functional Limits Postural Control Postural Control: Within Functional Limits  Balance Dynamic Sitting Balance Sitting balance - Comments: mod I  Static Standing Balance Static Standing - Comment/# of Minutes: mod I  Dynamic Standing Balance Dynamic Standing - Comments: mod I  Extremity/Trunk Assessment RUE Assessment RUE Assessment: Within Functional Limits LUE Strength LUE Overall Strength: Within Functional Limits for tasks assessed   See Function Navigator for Current Functional Status.  Willeen Cass [facility] 09/29/2015, 8:57 AM

## 2015-09-29 NOTE — Progress Notes (Signed)
Speech Language Pathology Discharge Summary  Patient Details  Name: [name] MRN: [id number] Date of Birth: [birth year]  Today's Date: 09/29/2015 SLP Individual Time: 1430-1500 SLP Individual Time Calculation (min): 30 min   Skilled Therapeutic Interventions:  Pt was seen for skilled ST targeting cognitive.  SLP facilitated the session with money management tasks targeting functional problem solving.  Pt was 90% accurate independently for counting money and making change.  Pt was also able to write a check and balance a checkbook for 100% accuracy independently.  Pt reports that he was more cautious going through tasks but otherwise denied experiencing any cognitive changes.   No education is needed at this time as pt is likely at baseline for cognition.  Pt was encouraged to seek a consult for outpatient speech services if his cognitive changes became more apparent once he returned to work but SLP does not anticipate any immediate needs at discharge.  Pt was left in recliner as he is now cleared to move about his room mod I.      Patient has met 1 of 1 long term goals.  Patient to discharge at overall Independent level.  Reasons goals not met:  n/a   Clinical Impression/Discharge Summary:  Pt made functional gains while inpatient and is discharging having met 1 out of 1 long term goals.  Pt is independent for complex cognitive tasks and is returned to cognitive baseline.  Pt is discharging home.  No further ST needs or education is indicated at this time.   Care Partner:  Caregiver Able to Provide Assistance: Yes  Type of Caregiver Assistance: Cognitive  Recommendation:  None      Equipment: none recommended by SLP    Reasons for discharge: Discharged from hospital   Patient/Family Agrees with Progress Made and Goals Achieved: Yes   Function:  Eating Eating                 Cognition Comprehension Comprehension assist level: Follows complex conversation/direction with no  assist  Expression   Expression assist level: Expresses complex ideas: With no assist  Social Interaction Social Interaction assist level: Interacts appropriately with others with medication or extra time (anti-anxiety, antidepressant).  Problem Solving Problem solving assist level: Solves complex problems: With extra time  Memory Memory assist level: Complete Independence: No helper   Emilio Math 09/29/2015, 4:02 PM

## 2015-09-29 NOTE — Progress Notes (Signed)
Subjective/Complaints: Discussed d/c date ROS No CP/SOB, N/V/D Objective: Vital Signs: Blood pressure 114/57, pulse 66, temperature 98.3 F (36.8 C), temperature source Oral, resp. rate 18, height _0  (1.727 m), weight 109.317 kg (241 lb), SpO2 98 %. No results found. Results for orders placed or performed during the hospital encounter of 09/23/15 (from the past 72 hour(s))  Valproic acid level     Status: None   Collection Time: 09/27/15  5:41 AM  Result Value Ref Range   Valproic Acid Lvl 77 50.0 - 100.0 ug/mL     HEENT: twitching at corner of mouth on left Cardio: RRR and no murmur Resp: CTA B/L and unlabored GI: BS positive and NT, ND Extremity:  Pulses positive and No Edema Skin:   Intact Neuro: Alert/Oriented, Cranial Nerve Abnormalities Left central VII, Normal Sensory and Abnormal Motor 3- in Left hand intrinsics, finger flexion and ext, 4+ in Bi, tri Delt Musc/Skel:  Normal Gen NAD   Assessment/Plan: 1. 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 Physiatrist is providing close team supervision and 24 hour management of active medical problems listed below. Physiatrist and rehab team continue to assess barriers to discharge/monitor patient progress toward functional and medical goals. FIM: Function - Bathing Position: Shower Body parts bathed by patient: Right arm, Left arm, Chest, Abdomen, Front perineal area, Buttocks, Right upper leg, Left upper leg, Right lower leg, Left lower leg Bathing not applicable: Back Assist Level: Supervision or verbal cues  Function- Upper Body Dressing/Undressing What is the patient wearing?: Pull over shirt/dress Pull over shirt/dress - Perfomed by patient: Thread/unthread right sleeve, Put head through opening, Pull shirt over trunk, Thread/unthread left sleeve Assist Level: More  than reasonable time Function - Lower Body Dressing/Undressing What is the patient wearing?: Underwear, Pants, Socks, Shoes Position: Other (comment) (in bathroom, sitting and stanidng from chair) Underwear - Performed by patient: Thread/unthread right underwear leg, Thread/unthread left underwear leg, Pull underwear up/down Pants- Performed by patient: Thread/unthread right pants leg, Thread/unthread left pants leg, Pull pants up/down, Fasten/unfasten pants Non-skid slipper socks- Performed by patient: Don/doff right sock, Don/doff left sock Socks - Performed by patient: Don/doff right sock, Don/doff left sock Shoes - Performed by patient: Don/doff right shoe, Don/doff left shoe, Fasten right, Fasten left Assist for footwear: Supervision/touching assist Assist for lower body dressing: Supervision or verbal cues  Function - Toileting Toileting steps completed by patient: Adjust clothing prior to toileting, Performs perineal hygiene, Adjust clothing after toileting Assist level: No help/no cues  Function Midwife transfer assistive device: Grab bar Assist level to toilet: Supervision or verbal cues Assist level from toilet: Supervision or verbal cues  Function - Chair/bed transfer Chair/bed transfer method: Ambulatory Chair/bed transfer assist level: Supervision or verbal cues Chair/bed transfer assistive device: Armrests Chair/bed transfer details: Verbal cues for precautions/safety  Function - Locomotion: Wheelchair Will patient use wheelchair at discharge?: No Type: Manual Max wheelchair distance: 500 Assist Level: No help, No cues, assistive device, takes more than reasonable amount of time Assist Level: No help, No cues, assistive device, takes more than reasonable amount of time Assist Level: No help, No cues, assistive device, takes more than reasonable amount of time Turns around,maneuvers to table,bed, and toilet,negotiates 3% grade,maneuvers on rugs and  over doorsills: Yes Function - Locomotion: Ambulation Assistive device: No device Max distance: >200 ft Assist level: No help, No  cues, assistive device, takes more than a reasonable amount of time Assist level: No help, No cues, assistive device, takes more than a reasonable amount of time Assist level: No help, No cues, assistive device, takes more than a reasonable amount of time Assist level: No help, No cues, assistive device, takes more than a reasonable amount of time Assist level: No help, No cues, assistive device, takes more than a reasonable amount of time  Function - Comprehension Comprehension: Auditory Comprehension assist level: Follows complex conversation/direction with no assist  Function - Expression Expression: Verbal Expression assist level: Expresses complex ideas: With no assist  Function - Social Interaction Social Interaction assist level: Interacts appropriately with others - No medications needed.  Function - Problem Solving Problem solving assist level: Solves complex problems: Recognizes & self-corrects  Function - Memory Memory assist level: Complete Independence: No helper Patient normally able to recall (first 3 days only): Current season   Medical Problem List and Plan: 1.  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 Formulized individual treatment plan based on medical history, underlying problem and comorbidities. 2.  DVT Prophylaxis/Anticoagulation: SCDs. Monitor for any signs of DVT 3. Pain Management/migraine headaches: D.C. Baclofen , Depakote 750 mg every 12 hours, Fioricet as needed 4. Mood. Lexapro 10 mg daily 5. Neuropsych: This patient is capable of making decisions on his own behalf. 6. Skin/Wound Care: Routine  skin checks 7. Fluids/Electrolytes/Nutrition: Routine I&O's with follow-up chemistries 8. Hypertension. Norvasc 10 mg daily, chlorthalidone 25 mg daily, lisinopril 40 mg daily,  Monitor with increased mobility,reduced Lopressor to 43m BID due to mildly low BP and HR on 5/1 monitor Filed Vitals:   09/28/15 2227 09/29/15 0512  BP: 116/62 114/57  Pulse: 62 66  Temp:  98.3 F (36.8 C)  Resp:  18   9. Hyperlipidemia. Lipitor 10. Prediabetic. Hemoglobin A1c 6.1. Begin diabetic diet. No current plan to check blood sugars. Follow-up outpatient 11.  Left facial twitching, Resolved Appreciate neurology note. Will keep Depakote level between 70 and 100. Depakote level stable at 77,. Continue Current dose,Will reduce Valproate level to weekly LOS (Days) 6 A FACE TO FACE EVALUATION WAS PERFORMED  Carmichael Burdette E 09/29/2015, 7:52 AM

## 2015-09-29 NOTE — Plan of Care (Signed)
Problem: RH Ambulation Goal: LTG Patient will ambulate in controlled environment (PT) LTG: Patient will ambulate in a controlled environment, # of feet with assistance (PT).  Outcome: Completed/Met Date Met:  09/29/15 150 ft no AD Goal: LTG Patient will ambulate in home environment (PT) LTG: Patient will ambulate in home environment, # of feet with assistance (PT).  Outcome: Completed/Met Date Met:  09/29/15 50 ft no AD Goal: LTG Patient will ambulate in community environment (PT) LTG: Patient will ambulate in community environment, # of feet with assistance (PT).  Outcome: Completed/Met Date Met:  09/29/15 150 ft no AD  Problem: RH Stairs Goal: LTG Patient will ambulate up and down stairs w/assist (PT) LTG: Patient will ambulate up and down # of stairs with assistance (PT)  Outcome: Completed/Met Date Met:  09/29/15 16 steps B rails, 4 steps 1 rail

## 2015-09-29 NOTE — Progress Notes (Signed)
Occupational Therapy Session Note  Patient Details  Name: ASHDEN POTEETE MRN: [id number] Date of Birth: Dec 31, 1955  Today's Date: 09/29/2015 OT Individual Time: 1503-1530 OT Individual Time Calculation (min): 27 min    Skilled Therapeutic Interventions/Progress Updates:    Pt ambulated to the therapy gym with modified independence.  He was able to work on tossing a tennis ball from one hand to the other with increased efficiency.  Had him work on tossing a ball while walking as well from hand to hand while counting backwards from 100 by 3s.  Increased time needed to calculate simple subtraction while dividing attention.  Utilized Biodex as well for balance reactions.  Pt scoring 43-53% on Limits of stability with stable surface.  When therapist reduced platform to skill level 12, making it less stable, score decreased to 23%.  Pt ambulated back to room at end of session.    Therapy Documentation Precautions:  Precautions Precautions: Fall Restrictions Weight Bearing Restrictions: No  Pain: Pain Assessment Pain Assessment: No/denies pain ADL: See Function Navigator for Current Functional Status.   Therapy/Group: Individual Therapy  Riyaan Heroux OTR/L 09/29/2015, 5:00 PM

## 2015-09-29 NOTE — Progress Notes (Signed)
Occupational Therapy Session Note  Patient Details  Name: [name] MRN: [id number] Date of Birth: [birth year]  Today's Date: 09/29/2015 OT Individual Time: 1001-1029 OT Individual Time Calculation (min): 28 min    Skilled Therapeutic Interventions/Progress Updates:    Pt ambulated to the tub shower in the ADL apartment and completed tub shower transfer with modified independence.  Next had him ambulate to the [facility] gym for work on [employer].  He was able to locate all lights in under 1 second in all fields during testing.  Progressed to divided attention task using the Dyn[employer]ision with integration of green lights, as well as red, and 2-3 digit numbers.  Initially pt did poorly at only 50% of stating the digits with 100% accuracy on pressing the red lights in under 2 seconds.  When time was reduced to 1.5 seconds for red lights.  His accuracy was 95% with pushing the red buttons and 100% accuracy on 3 digit numbers and not pushing out the green buttons.  Pt with one episode of only 50% accuracy with red lights in the left inferior quadrant during testing divided attention on one occasion as his concentration focused more on stating the digits in the T-scope instead of on the red dots.  Pt ambulated back to room at end of session.  Modified independent level in the room at this time.    Therapy Documentation Precautions:  Precautions Precautions: Fall Restrictions Weight Bearing Restrictions: No  Pain: Pain Assessment Pain Assessment: No/denies pain ADL: See Function Navigator for Current Functional Status.   Therapy/Group: Individual Therapy  Bud Kaeser OTR/L 09/29/2015, 12:33 PM

## 2015-09-29 NOTE — Discharge Summary (Signed)
NAMETHANOS, CAPAN NO.:  [id number]  MEDICAL RECORD NO.:  [id number]  LOCATION:  4M05C                        FACILITY:  [facility]  PHYSICIAN:  Lauraine Rinne, P.A.  DATE OF BIRTH:  04-09-1956  DATE OF ADMISSION:  09/23/2015 DATE OF DISCHARGE:  09/29/2015                              DISCHARGE SUMMARY   DISCHARGE DIAGNOSES: 1. Right MCA aneurysm, status post pipeline stenting-right MCA embolic     infarct. 2. SCDs for DVT prophylaxis. 3. Pain management. 4. Depression. 5. Hypertension. 6. Hyperlipidemia. 7. Prediabetic.  HISTORY OF PRESENT ILLNESS:  This is a 60 year old right-handed male, with history of hypertension, migraine headaches, who lives alone independent prior to admission.  Presented with numerous ED evaluations for headaches.  Placed on Depakote for headache prevention.  Admitted on September 14, 2015 with facial numbness and headache.  MRI of the brain showed no acute intracranial process.  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  No emergent large vessel occlusion.  Cerebral angiogram again showed large right aneurysm, underwent stenting on September 20, 2015 per Interventional Radiology.  EEG showed no seizure.  Followup MRI after stenting showed 3 small areas of acute infarct in the right MCA territory.  Echocardiogram ejection fraction of 65%.  No wall motion abnormalities.  Neurology consulted, maintained on aspirin and Plavix therapy.  Maintained on a regular diet. Physical and occupational therapy ongoing.  The patient was admitted for comprehensive rehab program.  PAST MEDICAL HISTORY:  See discharge diagnoses.  SOCIAL HISTORY:  Lives alone, independent prior to admission.  FUNCTIONAL STATUS:  Upon admission to rehab services was minimal assist, 110 feet for ambulation, minimal guard sit to stand, min-mod assist activities of daily living.  PHYSICAL EXAMINATION:  VITAL SIGNS:  Blood pressure  136/74, pulse 59, temperature 98, and respirations 21. GENERAL:  This was an alert male, mild left facial droop.  He did exhibit some decrease awareness of higher executive safety.  He was oriented to person and place. LUNGS:  Clear to auscultation.  No wheeze. CARDIAC:  Regular rate and rhythm.  No murmur. ABDOMEN:  Soft and nontender.  Good bowel sounds.  REHABILITATION HOSPITAL COURSE:  The patient was admitted to inpatient rehab services with therapies initiated on a 3-hour daily basis consisting of physical therapy, occupational therapy, speech therapy, and rehabilitation nursing.  The following issues were addressed during the patient's rehabilitation stay.  Pertaining to Mr. Heyboer's right MCA aneurysm, he had undergone stenting, maintained on aspirin and Plavix therapy.  He would follow up Neurology Services.  Pain management. Migraine headaches.  He continued on Depakote scheduled as well as Fioricet as needed.  Blood pressures controlled on lisinopril, Lopressor, and Norvasc.  He would follow up with his primary MD as well as would continue with Hygroton.  Noted history of depression.  He continued on Lexapro.  He was participating with his therapy program. Prediabetic hemoglobin A1c of 6.1.  He was on Diabetic diet.  Received full diabetic teaching.  The patient received weekly collaborative interdisciplinary team conferences to discuss estimated length of stay, family teaching, any barriers to discharge.  Performed trial runs  of wheelchair mobility to visit his sister while in the hospital.  Modified independent for wheelchair mobility.  Performed car transfers with the patient modified independent.  Educated the patient on safety at home. Demonstrated floor transfers with and without furniture support modified independent.  The patient able to ambulate over grafts on even surfaces without assistive device.  Activities of daily living and homemaking, able to gather his  clothing, independently shower with only simple set up.  The patient could organize his pills into a 4-A-day pill box 100% accurately.  It was stressed again the need for the patient's ongoing safety and awareness.  Full family teaching was completed and planned discharge to home.  DISCHARGE MEDICATIONS: 1. Norvasc 10 mg p.o. daily. 2. Aspirin 325 mg p.o. daily. 3. Lipitor 10 mg p.o. daily. 4. Fioricet 50-325-40 mg 1 tablet every 8 hours as needed headache,     dispense of 60 tablets. 5. Hygroton 25 mg p.o. daily. 6. Plavix 75 mg p.o. daily. 7. Depakote 750 mg p.o. t.i.d. 8. Lexapro 10 mg p.o. daily. 9. Lisinopril 40 mg p.o. daily. 10.Lopressor 50 mg p.o. b.i.d.  DIET:  Diabetic diet.  FOLLOWUP:  The patient would follow up with Dr. Alysia Penna at the outpatient rehab center as directed; Dr. Eldridge Abrahams medical management, and Dr. Erlinda Hong at the [facility] in 2 months.  Ongoing therapies had been arranged as per rehab services.  SPECIAL INSTRUCTIONS:  No driving.     Lauraine Rinne, P.A.     DA/MEDQ  D:  09/29/2015  T:  09/29/2015  Job:  [id number]  cc:   Erlinda Hong Dr. Richarda Osmond Dr. Ronnald Ramp

## 2015-09-29 NOTE — Consult Note (Signed)
INITIAL DIAGNOSTIC EVALUATION - [facility]   MEDICAL NECESSITY:  [name] was seen on the [facility] for an initial diagnostic evaluation owing to the patient's diagnosis of cerebral infarction.   Records indicate that [name] is a "61 y.o. right handed male with history of hypertension, migraine headaches. Per chart review patient lives alone independently prior to admission. One level home 14 steps to entry. Patient works at [employer]. Presented with numerous ED evaluations for headaches. Placed on Depakote for headache prevention. Admitted 09/14/2015 with facial numbness and headache. MRI of the brain showed no acute intracranial process. 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 No emergent large vessel occlusion. 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 Underwent pipeline stenting 09/20/2015 per interventional radiology. EEG showed no seizure. 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 Echocardiogram with ejection fraction of 65% no wall motion abnormalities. Neurology follow-up currently continues on aspirin and Plavix therapy."   During today's visit, [name] denied suffering from any major cognitive deficits, though he said his children have observed his math abilities to be worse and there are instances when he will call them by the wrong name. He described mild left visual neglect, as well as left upper and lower extremity sensory loss and poor balance. No other cognitive, motor or sensory symptoms endorsed.   From an emotional standpoint, [name] reported having a mixed reaction to his current situation in that he has been angry, scared and does not wish to live with major disability. His self-image has changed since he had a stroke, so he now sees  himself as "unhealthy", "vulnerable" and weak. Patient has a history of treatment for anxiety via Lexapro that has been helpful. He has never participated in counseling. There is a significant family history of mortality secondary to aneurysms in his family so he feels somewhat lucky to be alive. He has generally maintained a positive attitude and is working hard at therapy. Suicidal/homicidal ideation, plan or intent was denied. No manic or hypomanic episodes were reported. The patient denied ever experiencing any auditory/visual hallucinations. No major behavioral or personality changes were endorsed.   [name] feels that progress is being made in therapy. He has been mostly satisfied with the rehabilitation staff, though there is one nurse tech at night that he does not feel comfortable with so this was discussed with the patient and his Education officer, museum. No barriers to therapy identified.   PROCEDURES: [1 unit 90791] Diagnostic clinical interview  Review of available records   IMPRESSION: Overall, [name] denied experiencing any major cognitive difficulties and no overt issues were observed during the evaluation. However, he expressed desire to undergo more formal evaluation of cognition given the mild deficits observed by his family. As such, he will be scheduled for outpatient neuropsychological testing.   Emotionally, [name] has been distraught given his present medical situation and it has negatively affected his sense of self. Time was spent normalizing his emotional reaction and providing support as well as coping strategies. Participation in stroke survival group and individual counseling were recommended. Patient seemed somewhat amenable but was unsure. Additional time was also spent discussing stroke recovery course and expectations. Neuropsychology will informally follow up with this patient throughout his admission. I asked a social worker please provide him with mental health resources  and in his area. Continue Lexapro.   DIAGNOSIS:  Adjustment disorder with mixed depression and anxiety   Rutha Bouchard, Psy.D.  Clinical Neuropsychologist

## 2015-09-29 NOTE — Discharge Summary (Signed)
Discharge summary job [id number]

## 2015-09-29 NOTE — Patient Care Conference (Signed)
Inpatient RehabilitationTeam Conference and Plan of Care Update Date: 09/29/2015   Time: 11:00 AM    Patient Name: [name]      Medical Record Number: [id number]  Date of Birth: [birth year] Sex: Male         Room/Bed: 4M05C/4M05C-01 Payor Info: Payor: [employer] / Plan: Market researcher / Product Type: *No Product type* /    Admitting Diagnosis: R MCA ANEURYSM  Admit Date/Time:  09/23/2015  3:10 PM Admission Comments: No comment available   Primary Diagnosis:  Gait disturbance, post-stroke Principal Problem: Gait disturbance, post-stroke  Patient Active Problem List   Diagnosis Date Noted  . Complicated migraine   . Anxiety state   . 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 Cerebral infarction due to embolism of right middle cerebral artery ([place]) 09/23/2015  . Gait disturbance, post-stroke   . Drooping of mouth   . Stroke ([place])   . Benign essential HTN   . Migraine with aura and without status migrainosus, not intractable   . Tachypnea   . Prediabetes   . Acute blood loss anemia   . Brain aneurysm 09/20/2015  . Type 2 diabetes mellitus ([place]) 09/15/2015  . Leukocytosis 09/15/2015  . TIA (transient ischemic attack) 09/14/2015  . History of tear of ACL (anterior cruciate ligament) 03/08/2011  . Depression 09/07/2010  . URI (upper respiratory infection) 08/24/2010  . DECREASED HEARING, LEFT EAR 09/06/2006  . Hyperlipidemia 04/23/2006  . Essential hypertension 04/23/2006  . INSOMNIA 04/23/2006    Expected Discharge Date: Expected Discharge Date: 09/30/15  Team Members Present: Physician leading conference: Dr. Alysia Penna Social Worker Present: Ovidio Kin, LCSW Nurse Present: Dorien Chihuahua, RN PT Present: Other (comment) Barrie Folk & Myrtie Hawk) OT Present: Willeen Cass, OT SLP Present: Windell Moulding, SLP PPS Coordinator present : Daiva Nakayama, RN, CRRN     Current Status/Progress Goal Weekly Team Focus  Medical   depressed mood, improved Left hand  strength  Home at mod I level, long term is return to drive and work  d/c planning   Bowel/Bladder   Continent of bowel and bladder. LBM 09/27/15  Continent of bowel and bladder  Monitor   Swallow/Nutrition/ Hydration     na        ADL's   Mod I for bathing/dressing, supervision for bath transfers and dynamic standing balance  Overall mod I  Improved awareness, dynamic standing balance, endurance, left attention, home making   Mobility   supervision progressing to mod I with gait without AD, Mod I for stair negotiation with 1 or 2 rails, supervision/mod I for standing balance, decreased L peripheral vision  Mod I for all gait training, transfers & stair negotiation  addressing decreased L peripheral vision, high level gait training, balance training   Communication     na        Safety/Cognition/ Behavioral Observations  mod I for semi-complex tasks  no unsafe behaviors Independent  completion of education prior to discharge    Pain   Tylenol 650mg  q 4hrs prn for discomfort  <4  Monitor for nonverbal cues of pain   Skin   Bruising to R groin, Cut to R foot per report  No additional skin breakdown  Assess skin q shift      *See Care Plan and progress notes for long and short-term goals.  Barriers to Discharge: lives alone    Possible Resolutions to Barriers:  upgrade to mod level    Discharge Planning/Teaching Needs:    Home alone  with intermittent assist from neighbors and daughter's. Will get HH follow up due to sister in car accident and unable to transport to OP therapies.     Team Discussion:  Pt reaching goals for DC tomorrow, going on an outing today. Has been visiting sister and niece in the hospital due to car accident. Medically stable for DC tomorrow-BP controlled and managed  Revisions to Treatment Plan:  DC tomorrow   Continued Need for Acute Rehabilitation Level of Care: The patient requires daily medical management by a physician with specialized training in  physical medicine and rehabilitation for the following conditions: Daily direction of a multidisciplinary physical rehabilitation program to ensure safe treatment while eliciting the highest outcome that is of practical value to the patient.: Yes Daily medical management of patient stability for increased activity during participation in an intensive rehabilitation regime.: Yes Daily analysis of laboratory values and/or radiology reports with any subsequent need for medication adjustment of medical intervention for : Neurological problems;Mood/behavior problems  Carleton Van[place], Gardiner Rhyme 09/29/2015, 1:28 PM

## 2015-09-29 NOTE — Progress Notes (Addendum)
Physical Therapy Discharge Summary  Patient Details  Name: [name] MRN: [id number] Date of Birth: 12-05-1955  Today's Date: 09/29/2015 PT Individual Time: 0903-0958 PT Individual Time Calculation (min): 55 min    Patient has met 9 of 9 long term goals due to improved activity tolerance, improved balance, improved postural control, increased strength, ability to compensate for deficits, improved attention and improved awareness.  Patient to discharge at an ambulatory level Modified Independent.   Patient does not have a care partner at home and is discharging at a Mod I ambulatory level without an AD.  Reasons goals not met: N/A  Recommendation:  Patient will benefit from ongoing skilled PT services in outpatient setting versus home health setting based on pt's ability to travel to appointments to continue to advance safe functional mobility, address ongoing impairments in decreased L peripheral vision, decreased high level balance & reaction times and minimize fall risk.  Equipment: No equipment provided  Reasons for discharge: treatment goals met  Patient/family agrees with progress made and goals achieved: Yes  Skilled PT intervention: Patient received in w/c & agreeable to PT, denying c/o pain. Pt able to ambulate >150 ft without AD & Mod I throughout session. Pt able to demonstrate Mod I with all transfers (car, sit<>Stand, floor), bed mobility, stair negotiation with 1 or 2 rails, and ambulation. Pt completed car transfer from significantly elevated car height as pt reports he will travel home in his daughter's raised truck. Pt also able to recall 3/4 situations in which he should call EMS after falling at home.  PT gave pt OTAGO Level B exercises & pt able to demonstrate exercises with mod I & good safety awareness. PT educated pt on need to hold on to stable surface (kitchen counter, sturdy couch) as needed when performing exercises. Pt reported he has no concerns regarding d/c  home alone. PT educated pt on benefits of OPPT versus HHPT for higher level balance training but pt is unsure whether he has transportation to/from outpatient appointments. Pt utilized nu-step on Level 7 for a total of 10 minutes for BLE strengthening/endurance training. At end of session pt returned to room & left in w/c with all needs within reach.   PT Discharge Precautions/Restrictions Restrictions Weight Bearing Restrictions: No  Pain Pain Assessment Pain Assessment: No/denies pain Vision/Perception  Vision - Assessment Decreased L peripheral vision Cognition Arousal/Alertness: Awake/alert Orientation Level: Oriented X4 Attention: Focused Sustained Attention: Appears intact Alternating Attention: Appears intact Memory: Appears intact Awareness: Appears intact Problem Solving: Appears intact Safety/Judgment: Appears intact Sensation Sensation Light Touch: Appears Intact (BLE) Coordination Gross Motor Movements are Fluid and Coordinated: Yes Fine Motor Movements are Fluid and Coordinated: Yes Heel Shin Test:  (3 seconds with L LE, 4 seconds with RLE) Motor  Motor Motor: Within Functional Limits Motor - Discharge Observations: improved hemiparesis and coordination  Mobility Bed Mobility Bed Mobility: Sit to Supine;Rolling Right;Rolling Left;Supine to Sit (regular bed) Rolling Right: 6: Modified independent (Device/Increase time) Rolling Left: 6: Modified independent (Device/Increase time) Supine to Sit: 6: Modified independent (Device/Increase time) Sit to Supine: 6: Modified independent (Device/Increase time) Transfers Transfers: Yes Sit to Stand: 6: Modified independent (Device/Increase time) Stand to Sit: 6: Modified independent (Device/Increase time) Stand Pivot Transfers: 6: Modified independent (Device/Increase time) Locomotion  Ambulation Ambulation: Yes Ambulation/Gait Assistance: 6: Modified independent (Device/Increase time) Ambulation Distance (Feet):  200 Feet Assistive device: None Gait Gait: Yes Gait Pattern:  (decreased LUE swing) High Level Ambulation High Level Ambulation: Side stepping Side Stepping:  (  Mod I without AD) Stairs / Additional Locomotion Stairs: Yes Stairs Assistance: 6: Modified independent (Device/Increase time) Stair Management Technique:  (4 steps with 1 rail, 16 steps with B rails) Height of Stairs: 6 (inches) Ramp: 6: Modified independent (Device) Wheelchair Mobility Wheelchair Mobility: No   Balance Balance Balance Assessed: Yes Standardized Balance Assessment Standardized Balance Assessment: 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 Sit to Stand: Able to stand without using hands and stabilize independently Standing Unsupported: Able to stand safely 2 minutes Sitting with Back Unsupported but Feet Supported on Floor or Stool: Able to sit safely and securely 2 minutes Stand to Sit: Sits safely with minimal use of hands Transfers: Able to transfer safely, minor use of hands Standing Unsupported with Eyes Closed: Able to stand 10 seconds safely Standing Ubsupported with Feet Together: Able to place feet together independently and stand 1 minute safely From Standing, Reach Forward with Outstretched Arm: Can reach confidently >25 cm (10") From Standing Position, Pick up Object from Floor: Able to pick up shoe safely and easily From Standing Position, Turn to Look Behind Over each Shoulder: Looks behind from both sides and weight shifts well Turn 360 Degrees: Able to turn 360 degrees safely in 4 seconds or less Standing Unsupported, Alternately Place Feet on Step/Stool: Able to stand independently and safely and complete 8 steps in 20 seconds (8 seconds) Standing Unsupported, One Foot in Front: Able to plae foot ahead of the other independently and hold 30 seconds Standing on One Leg: Able to lift leg independently and hold 5-10 seconds Total Score: 54  Extremity Assessment   RLE Assessment RLE Assessment:  Within Functional Limits LLE Assessment LLE Assessment: Exceptions to WFL LLE AROM (degrees) Overall AROM Left Lower Extremity: Within functional limits for tasks assessed LLE Strength Left Hip Flexion: 5/5 Left Knee Flexion: 5/5 Left Knee Extension: 5/5 Left Ankle Dorsiflexion: 4/5   See Function Navigator for Current Functional Status.  [name] 09/29/2015, 9:49 AM

## 2015-09-29 NOTE — Discharge Instructions (Signed)
Inpatient Rehab Discharge Instructions  [name] Discharge date and time: No discharge date for patient encounter.   Activities/Precautions/ Functional Status: Activity: activity as tolerated Diet: diabetic diet Wound Care: none needed Functional status:  ___ No restrictions     ___ Walk up steps independently ___ 24/7 supervision/assistance   ___ Walk up steps with assistance ___ Intermittent supervision/assistance  ___ Bathe/dress independently ___ Walk with walker     _x__ Bathe/dress with assistance ___ Walk Independently    ___ Shower independently ___ Walk with assistance    ___ Shower with assistance ___ No alcohol     ___ Return to work/school ________  Special Instructions:    COMMUNITY REFERRALS UPON DISCHARGE:    Home Health:   PT & RN  Agency:[facility] Phone:[phone]   Date of last service:09/30/2015   Medical Equipment/Items Ordered:NONE NEEDED     GENERAL COMMUNITY RESOURCES FOR PATIENT/FAMILY: Support Groups:[facility] SUPPORT GROUP EVERY SECOND Thursday @ 3:00-4:00 PM ON THE REHAB UNIT QUESTIONS CONTACT KATIE [id number]  My questions have been answered and I understand these instructions. I will adhere to these goals and the provided educational materials after my discharge from the hospital.  Patient/Caregiver Signature _______________________________ Date __________  Clinician Signature _______________________________________ Date __________  Please bring this form and your medication list with you to all your follow-up doctor's appointments.

## 2015-09-30 MED ORDER — CHLORTHALIDONE 25 MG PO TABS: 25.0000 mg | ORAL_TABLET | Freq: Every day | ORAL | Status: DC

## 2015-09-30 MED ORDER — LISINOPRIL 20 MG PO TABS: 40.0000 mg | ORAL_TABLET | Freq: Every morning | ORAL | Status: DC

## 2015-09-30 MED ORDER — BUTALBITAL-APAP-CAFFEINE 50-325-40 MG PO TABS: 1.0000 | ORAL_TABLET | Freq: Three times a day (TID) | ORAL | Status: DC | PRN

## 2015-09-30 MED ORDER — ESCITALOPRAM OXALATE 10 MG PO TABS: 10.0000 mg | ORAL_TABLET | Freq: Every day | ORAL | Status: DC

## 2015-09-30 MED ORDER — ATORVASTATIN CALCIUM 10 MG PO TABS: 10.0000 mg | ORAL_TABLET | Freq: Every day | ORAL | Status: DC

## 2015-09-30 MED ORDER — CLOPIDOGREL BISULFATE 75 MG PO TABS: 75.0000 mg | ORAL_TABLET | Freq: Every day | ORAL | Status: DC

## 2015-09-30 MED ORDER — DIVALPROEX SODIUM 250 MG PO DR TAB: 750.0000 mg | DELAYED_RELEASE_TABLET | Freq: Three times a day (TID) | ORAL | Status: DC

## 2015-09-30 MED ORDER — AMLODIPINE BESYLATE 10 MG PO TABS: 10.0000 mg | ORAL_TABLET | Freq: Every day | ORAL | Status: DC

## 2015-09-30 MED ORDER — METOPROLOL TARTRATE 50 MG PO TABS: 50.0000 mg | ORAL_TABLET | Freq: Two times a day (BID) | ORAL | Status: DC

## 2015-09-30 NOTE — Progress Notes (Signed)
Pt discharged to home with daughter. Discharge instructions given to pt and daughter by Silvestre Mesi, PA with verbal understanding. All belongings with pt.

## 2015-09-30 NOTE — Progress Notes (Signed)
Subjective/Complaints:  ROS No CP/SOB, N/V/D Objective: Vital Signs: Blood pressure 114/57, pulse 58, temperature 98.3 F (36.8 C), temperature source Oral, resp. rate 18, height 5\' 8"  (1.727 m), weight 109.317 kg (241 lb), SpO2 97 %. No results found. No results found for this or any previous visit (from the past 72 hour(s)).   HEENT: twitching at corner of mouth on left Cardio: RRR and no murmur Resp: CTA B/L and unlabored GI: BS positive and NT, ND Extremity:  Pulses positive and No Edema Skin:   Intact Neuro: Alert/Oriented, Cranial Nerve Abnormalities Left central VII, Normal Sensory and Abnormal Motor 3- in Left hand intrinsics, finger flexion and ext, 4+ in Bi, tri Delt Musc/Skel:  Normal Gen NAD   Assessment/Plan: 1. 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  Stable for D/C today F/u PCP in 3-4 weeks F/u PM&R 2 weeks No work or driving Driving to be cleared by Neuro Needs NS f/u as well See D/C summary See D/C instructions FIM: Function - Bathing Position: Shower Body parts bathed by patient: Right arm, Left arm, Chest, Abdomen, Front perineal area, Buttocks, Right upper leg, Left upper leg, Right lower leg, Left lower leg Bathing not applicable: Back Assist Level: More than reasonable time  Function- Upper Body Dressing/Undressing What is the patient wearing?: Pull over shirt/dress Pull over shirt/dress - Perfomed by patient: Thread/unthread right sleeve, Put head through opening, Pull shirt over trunk, Thread/unthread left sleeve Assist Level: No help, No cues Function - Lower Body Dressing/Undressing What is the patient wearing?: Underwear, Pants, Socks, Shoes Position:  (sit to stand) Underwear - Performed by patient: Thread/unthread right underwear leg, Thread/unthread left underwear leg, Pull underwear up/down Pants- Performed by patient: Thread/unthread right pants leg, Thread/unthread left pants leg,  Pull pants up/down, Fasten/unfasten pants Non-skid slipper socks- Performed by patient: Don/doff right sock, Don/doff left sock Socks - Performed by patient: Don/doff right sock, Don/doff left sock Shoes - Performed by patient: Don/doff right shoe, Don/doff left shoe, Fasten right, Fasten left Assist for footwear: Independent Assist for lower body dressing: More than reasonable time  Function - Toileting Toileting steps completed by patient: Adjust clothing prior to toileting, Performs perineal hygiene, Adjust clothing after toileting Assist level: No help/no cues  Function - Air cabin crew transfer assistive device: Grab bar Assist level to toilet: No Help, no cues, assistive device, takes more than a reasonable amount of time Assist level from toilet: No Help, no cues, assistive device, takes more than a reasonable amount of time  Function - Chair/bed transfer Chair/bed transfer method: Ambulatory Chair/bed transfer assist level: No Help, no cues, assistive device, takes more than a reasonable amount of time Chair/bed transfer assistive device: Armrests Chair/bed transfer details: Verbal cues for precautions/safety  Function - Locomotion: Wheelchair Will patient use wheelchair at discharge?: No Type: Manual Wheelchair activity did not occur: N/A Max wheelchair distance: 500 Assist Level: No help, No cues, assistive device, takes more than reasonable amount of time Wheel 50 feet with 2 turns activity did not occur: N/A Assist Level: No help, No cues, assistive device, takes more than reasonable amount of time Wheel 150 feet activity did not occur: N/A Assist Level: No help, No cues, assistive device, takes more than reasonable amount of time Turns around,maneuvers to table,bed, and toilet,negotiates 3% grade,maneuvers on rugs and over doorsills: Yes Function - Locomotion: Ambulation Assistive device: No device Max distance: >200 ft Assist level: No help, No cues,  assistive device, takes  more than a reasonable amount of time Assist level: No help, No cues, assistive device, takes more than a reasonable amount of time Assist level: No help, No cues, assistive device, takes more than a reasonable amount of time Assist level: No help, No cues, assistive device, takes more than a reasonable amount of time Assist level: No help, No cues, assistive device, takes more than a reasonable amount of time  Function - Comprehension Comprehension: Auditory Comprehension assist level: Follows complex conversation/direction with no assist  Function - Expression Expression: Verbal Expression assist level: Expresses complex ideas: With no assist  Function - Social Interaction Social Interaction assist level: Interacts appropriately with others with medication or extra time (anti-anxiety, antidepressant).  Function - Problem Solving Problem solving assist level: Solves complex problems: With extra time  Function - Memory Memory assist level: Complete Independence: No helper Patient normally able to recall (first 3 days only): Current season   Medical Problem List and Plan: 1.  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 Formulized individual treatment plan based on medical history, underlying problem and comorbidities. 2.  DVT Prophylaxis/Anticoagulation: SCDs. Monitor for any signs of DVT 3. Pain Management/migraine headaches:  , Depakote 750 mg every 12 hours, Fioricet as needed 4. Mood. Lexapro 10 mg daily 5. Neuropsych: This patient is capable of making decisions on his own behalf. 6. Skin/Wound Care: Routine skin checks 7. Fluids/Electrolytes/Nutrition: Routine I&O's with follow-up chemistries 8. Hypertension. Norvasc 10 mg daily, chlorthalidone 25 mg daily, lisinopril 40 mg daily,  Monitor with increased mobility,reduced Lopressor to 50mg  BID due to mildly low BP and HR on 5/1  monitor Filed Vitals:   09/29/15 2000 09/30/15 0519  BP: 131/60 114/57  Pulse: 67 58  Temp:  98.3 F (36.8 C)  Resp:  18   9. Hyperlipidemia. Lipitor 10. Prediabetic. Hemoglobin A1c 6.1. Begin diabetic diet. No current plan to check blood sugars. Follow-up outpatient 11.  Left facial twitching, Resolved Appreciate neurology note. Will keep Depakote level between 70 and 100. Depakote level stable at 77,. Continue Current dose,Will reduce Valproate level to weekly LOS (Days) 7 A FACE TO FACE EVALUATION WAS PERFORMED  Anastasios Melander E 09/30/2015, 7:16 AM
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
## 2015-10-01 ENCOUNTER — Telehealth: Payer: Self-pay

## 2015-10-01 NOTE — Telephone Encounter (Addendum)
1. Are you/is patient experiencing any problems since coming home? Are there any questions regarding any aspect of care? No issues stated. 2. Are there any questions regarding medications administration/dosing? Are meds being taken as prescribed? Patient should review meds with caller to confirm. Meds have been confirmed.  3. Have there been any falls? No falls.  4. Has Home Health been to the house and/or have they contacted you? If not, have you tried to contact them? Can we help you contact them? Home health PT has been out.  5. Are bowels and bladder emptying properly? Are there any unexpected incontinence issues? If applicable, is patient following bowel/bladder programs? No issues.  6. Any fevers, problems with breathing, unexpected pain? No issues.  7. Are there any skin problems or new areas of breakdown? No issues.  8. Has the patient/family member arranged specialty MD follow up (ie cardiology/neurology/renal/surgical/etc)?  Can we help arrange? Follow up appointments have been made. 9. Does the patient need any other services or support that we can help arrange? No 10. Are caregivers following through as expected in assisting the patient? Daughter has been helping.  11. Has the patient quit smoking, drinking alcohol, or using drugs as recommended? Pt is not smoking, drinking alcohol or use drugs.   Pt has an appointment on 10/12/15 @ 10:00 am.  Pt states to call daughter, [name], to confirm appointment at [phone].

## 2015-10-05 ENCOUNTER — Telehealth (HOSPITAL_COMMUNITY): Payer: Self-pay

## 2015-10-05 NOTE — Telephone Encounter (Signed)
Waiting on return call from [facility], daughter.

## 2015-10-05 NOTE — Telephone Encounter (Signed)
Called to schedule f/u with Dr. Estanislado Pandy. Left message for pt to call back. AW

## 2015-10-07 DIAGNOSIS — I6990 Unspecified sequelae of unspecified cerebrovascular disease: Secondary | ICD-10-CM | POA: Insufficient documentation

## 2015-10-12 ENCOUNTER — Encounter: Payer: Self-pay | Admitting: Physical Medicine & Rehabilitation

## 2015-10-12 ENCOUNTER — Ambulatory Visit (HOSPITAL_BASED_OUTPATIENT_CLINIC_OR_DEPARTMENT_OTHER): Payer: Managed Care, Other (non HMO) | Admitting: Physical Medicine & Rehabilitation

## 2015-10-12 ENCOUNTER — Encounter: Payer: Managed Care, Other (non HMO) | Attending: Physical Medicine & Rehabilitation

## 2015-10-12 VITALS — BP 125/73 | HR 76 | Resp 15

## 2015-10-12 DIAGNOSIS — N529 Male erectile dysfunction, unspecified: Secondary | ICD-10-CM | POA: Insufficient documentation

## 2015-10-12 DIAGNOSIS — G43109 Migraine with aura, not intractable, without status migrainosus: Secondary | ICD-10-CM

## 2015-10-12 DIAGNOSIS — I69398 Other sequelae of cerebral infarction: Secondary | ICD-10-CM | POA: Diagnosis not present

## 2015-10-12 DIAGNOSIS — R7303 Prediabetes: Secondary | ICD-10-CM | POA: Insufficient documentation

## 2015-10-12 DIAGNOSIS — I63411 Cerebral infarction due to embolism of right middle cerebral artery: Secondary | ICD-10-CM | POA: Insufficient documentation

## 2015-10-12 DIAGNOSIS — F329 Major depressive disorder, single episode, unspecified: Secondary | ICD-10-CM | POA: Diagnosis not present

## 2015-10-12 DIAGNOSIS — G4700 Insomnia, unspecified: Secondary | ICD-10-CM | POA: Insufficient documentation

## 2015-10-12 DIAGNOSIS — I10 Essential (primary) hypertension: Secondary | ICD-10-CM | POA: Insufficient documentation

## 2015-10-12 DIAGNOSIS — R269 Unspecified abnormalities of gait and mobility: Secondary | ICD-10-CM | POA: Diagnosis not present

## 2015-10-12 DIAGNOSIS — E785 Hyperlipidemia, unspecified: Secondary | ICD-10-CM | POA: Insufficient documentation

## 2015-10-12 NOTE — Progress Notes (Signed)
Subjective:    Patient ID: [name], male    DOB: August 20, 1955, 60 y.o.   MRN: [id number] 60 year old right-handed male, with history of hypertension, migraine headaches, who lives alone independent prior to admission.  Presented with numerous ED evaluations for headaches.  Placed on Depakote for headache prevention.  Admitted on September 14, 2015 with facial numbness and headache.  MRI of the brain showed no acute intracranial process.  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  No emergent large vessel occlusion.  Cerebral angiogram again showed large right aneurysm, underwent stenting on September 20, 2015 per Interventional Radiology.  EEG showed no seizure.  Followup MRI after stenting showed 3 small areas of acute infarct in the right MCA territory.  Echocardiogram ejection fraction of 65%.  No wall motion abnormalities.  Neurology consulted, maintained on aspirin and Plavix therapy. DATE OF ADMISSION: 09/23/2015 DATE OF DISCHARGE: 09/29/2015 HPI Seen by PCP post d/c.  Still very fatigued.  Independent with ADLs.  Not working , trying to get short term disability. Not driving until cleared by Neuro.  LLE weakness still an issue, old ACL injury with some subluxation  Walking around condo complex Goes up and down  Pain Inventory Average Pain 0 Pain Right Now 0 My pain is No Pain  In the last 24 hours, has pain interfered with the following? General activity 0 Relation with others 0 Enjoyment of life 0 What TIME of day is your pain at its worst? No Pain Sleep (in general) NA  Pain is worse with: NA Pain improves with: NA Relief from Meds: NA  Mobility walk without assistance ability to climb steps?  yes do you drive?  no  Function employed # of hrs/week 40 what is your job? [employer]- Currently not working due to health  Neuro/Psych weakness  Prior Studies Any changes since last visit?  no  Physicians involved in your  care Any changes since last visit?  no   Family History  Problem Relation Age of Onset  . Colon cancer Neg Hx   . Rectal cancer Neg Hx   . Stomach cancer Neg Hx   . Stroke Mother   . Hypertension Mother   . Aneurysm Mother 49    Died of brain aneursym  . Heart failure Father   . Emphysema Father   . Diabetes Mellitus II Sister    Social History   Social History  . Marital Status: Divorced    Spouse Name: N/A  . Number of Children: N/A  . Years of Education: N/A   Social History Main Topics  . Smoking status: Never Smoker   . Smokeless tobacco: Never Used  . Alcohol Use: No  . Drug Use: No  . Sexual Activity: Not Asked   Other Topics Concern  . None   Social History Narrative   Separated, has 2 daughters, works as a Forensic psychologist, Therapist, occupational   Past Surgical History  Procedure Laterality Date  . Skin tag removal      11 removed  . Knee arthroscopy    . Incise and drain abcess      L elbow due to cellulitis/bursitis  . Inner ear surgery    . Radiology with anesthesia N/A 09/20/2015    Procedure: EMBOLIZATION         (RADIOLOGY WITH ANESTHESIA);  Surgeon: Luanne Bras, MD;  Location: [facility];  Service: Radiology;  Laterality: N/A;   Past  Medical History  Diagnosis Date  . Chest pain, atypical     12/2003:  negative cardiolyte  . HTN (hypertension)   . HLD (hyperlipidemia)   . Depression   . Hx of tear of ACL (anterior cruciate ligament)     right  . History of meniscal tear     bilateral  . Erectile dysfunction   . Insomnia   . Eczema   . Olecranon bursitis of left elbow 10/2009    s/p I&D by Dr Maxie Better, initially assessed by Dr. Nori Riis   . Allergy   . Anxiety   . Cataract   . Prediabetes   . TIA (transient ischemic attack)    BP 125/73 mmHg  Pulse 76  Resp 15  SpO2 95%  Opioid Risk Score:   Fall Risk Score:  `1  Depression screen PHQ 2/9  Depression screen [facility] 2/9 10/12/2015 03/08/2011 08/24/2010  Decreased Interest 0 0 0  Down,  Depressed, Hopeless 3 0 0  PHQ - 2 Score 3 0 0  Altered sleeping 3 - -  Tired, decreased energy 3 - -  Change in appetite 0 - -  Feeling bad or failure about yourself  1 - -  Trouble concentrating 0 - -  Moving slowly or fidgety/restless 0 - -  Suicidal thoughts 0 - -  PHQ-9 Score 10 - -  Difficult doing work/chores Somewhat difficult - -      Review of Systems  Constitutional: Positive for unexpected weight change.  Neurological: Positive for weakness.  All other systems reviewed and are negative.      Objective:   Physical Exam  Constitutional: He appears well-developed and well-nourished.  HENT:  Head: Normocephalic and atraumatic.  Eyes: Conjunctivae and EOM are normal. Pupils are equal, round, and reactive to light.  Neck: Normal range of motion.  Cardiovascular: Normal rate and regular rhythm.   Pulmonary/Chest: Effort normal and breath sounds normal.  Abdominal: Soft. Bowel sounds are normal.  Musculoskeletal: Normal range of motion.  Psychiatric: He has a normal mood and affect.  Nursing note and vitals reviewed.   4+/5 LUE delt, bi tri grip 5/5 Left HF, KE, ADF 5/5 RUE and RLE       Assessment & Plan:  Medical Problem List and Plan: 1.  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 Return to clinic in one month, we'll discuss return to work, driving clearance will be per neurology 2.  DVT Prophylaxis/Anticoagulation: SCDs. Monitor for any signs of DVT 3. Pain Management/migraine headaches: , Depakote 500 mg every 12 hours, Fioricet as needed, Follow-up neurology 4. Mood. Lexapro 10 mg daily, Follow-up PCP 5. Neuropsych: This patient is capable of making decisions on his own behalf.   8. Hypertension. Norvasc 10 mg daily, chlorthalidone 25 mg daily, lisinopril 40 mg daily, Lopressor 100 mg twice a day.  Monitor with increased mobility, Follow-up PCP 9. Hyperlipidemia. Lipitor, Follow-up PCP 10. Prediabetic. Hemoglobin A1c 6.1. Begin diabetic diet. No current plan to check blood sugars. Follow-up PCP

## 2015-10-12 NOTE — Patient Instructions (Addendum)
Please asked Dr. Erlinda Hong or the neurologist that you see whether you can drive. Clarify whether it was migraines versus  Partial seizure Affecting the face  We will discuss return to work next visit  Walk as much as possible around condo complex

## 2015-10-15 ENCOUNTER — Encounter: Payer: Self-pay | Admitting: Neurology

## 2015-10-15 ENCOUNTER — Ambulatory Visit (INDEPENDENT_AMBULATORY_CARE_PROVIDER_SITE_OTHER): Payer: Managed Care, Other (non HMO) | Admitting: Neurology

## 2015-10-15 VITALS — BP 111/68 | HR 60 | Ht 68.0 in | Wt 252.0 lb

## 2015-10-15 DIAGNOSIS — I63411 Cerebral infarction due to embolism of right middle cerebral artery: Secondary | ICD-10-CM

## 2015-10-15 DIAGNOSIS — I69398 Other sequelae of cerebral infarction: Secondary | ICD-10-CM

## 2015-10-15 DIAGNOSIS — G43109 Migraine with aura, not intractable, without status migrainosus: Secondary | ICD-10-CM | POA: Diagnosis not present

## 2015-10-15 DIAGNOSIS — I671 Cerebral aneurysm, nonruptured: Secondary | ICD-10-CM | POA: Diagnosis not present

## 2015-10-15 DIAGNOSIS — R29818 Other symptoms and signs involving the nervous system: Secondary | ICD-10-CM | POA: Diagnosis not present

## 2015-10-15 DIAGNOSIS — R269 Unspecified abnormalities of gait and mobility: Secondary | ICD-10-CM | POA: Diagnosis not present

## 2015-10-15 DIAGNOSIS — H9192 Unspecified hearing loss, left ear: Secondary | ICD-10-CM

## 2015-10-15 MED ORDER — DIVALPROEX SODIUM ER 500 MG PO TB24: 1000.0000 mg | ORAL_TABLET | Freq: Every day | ORAL | Status: DC

## 2015-10-15 NOTE — Patient Instructions (Signed)
Change the butalbital combination pill to one a day for 1 week then stop. You can still take it up to 3 times a day when you have a headache.

## 2015-10-15 NOTE — Progress Notes (Signed)
[facility]  PATIENT: [name] DOB: 03/01/1956  REFERRING DOCTOR OR PCP:  Eldridge Abrahams (PCP)   Her phone is [phone]  _________________________________   HISTORICAL  CHIEF COMPLAINT:  Chief Complaint  Patient presents with  . New Patient   [place]    Post stroke, has seen Dr. Erlinda Hong  . Migraine    HISTORY OF PRESENT ILLNESS:  I had the pleasure of seeing your patient, [name], at [facility] for neurologic consultation regarding his headaches, recent aneurysm repair, recent stroke and possibility of seizures.   He is a 60 yo man who has had migraines since he was young.   A typical headache had an aura with tunnel vision x 15 minutes followed by a throbbing headache.  He had many when he was youg but less in middle age until the past couple years when they occurred every few months.   He had many more in March (every other day)  In March, he had an episode of altered vision with confusion and left arm numbness .    He had a CT scan and was told he had a TIA.   After that he had more frequent headache.   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   He had a pipeline stent device placed procedure 09/20/15.    That night, he had multiple (3) episodes with slurred speech, confusion and left arm numbness and weakness.  He ws treated with saline boluses and blood pressure was titrated to systolic [id number] to [id number] with Cardene.   The next day, he had a tingling mouth and spasms in his lips.   He also had cramping in his left hand.   He had changes in his medications    He had an MRI 09/21/15 showing several  punctate right hemispheric acute strokes on DWI.      He was on IV heparin for the next week.    He did PT/OT and the left side improved but not to baseline.      He had several episodes of numbness on the left with left hand clenching lasting seconds to minutes at a time.   The EEG 09/22/15 was normal.     Currently, the left  side is back to or close to baseline in strength.   He feels slightly clumsy on the left side and feels he is still improving.   Balance is better.    His numbness has resolved.    Speech is normal.    He notes some decreased peripheral vision on his left.      He has not had any headaches the past 10 days.      Data reviewed: Imaging and laboratory reports were reviewed. Multiple doctor notes from his April admission and rehabilitation notes were reviewed, summarized above.   EEG and other studies reviewed. Actual MRI images and CT images were reviewed on PACS  IMAGING I have personally reviewed the radiological images below and agree with the radiology interpretations  MRI HEAD 09/15/15: No acute intracranial process.  Susceptibility artifact LEFT caudate head could represent a cavernoma.  Moderate chronic small vessel ischemic disease.  MRA HEAD 09/15/15: Moderately motion degraded examination.  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 Poorly visualized RIGHT A2 segment. Recommend CTA head for further Characterization.     No emergent large vessel occlusion or high-grade stenosis though motion limits assessment.Ct Head  Wo Contrast  09/20/2015 1. No evidence of acute intracranial abnormality following recent endovascular pericallosal aneurysm treatment. 2. Mild chronic small vessel ischemic disease.   Ct Angio Head & Neck W/cm &/or Wo/cm 09/20/2015 1. No large or proximal arterial branch occlusion. No complication status post recent catheter directed arteriogram. 2. 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 3. Mild intracranial and extracranial atheromatous disease as above.   Mr Brain Wo Contrast 09/21/2015 Three small areas of acute infarct in the right MCA territory likely areas of embolic infarction. Pipeline stent left anterior cerebral artery. No evidence of acute intracranial hemorrhage.   TTE - - Left ventricle: The  cavity size was normal. Systolic function was  normal. The estimated ejection fraction was in the range of 60%  to 65%. Wall motion was normal; there were no regional wall  motion abnormalities. Left ventricular diastolic function  parameters were normal. - Mitral valve: There was no regurgitation. - Right ventricle: The cavity size was normal. Wall thickness was  normal. Systolic function was normal. - Atrial septum: No defect or patent foramen ovale was identified  by color flow Doppler. - Tricuspid valve: There was no regurgitation. - Inferior vena cava: The vessel was normal in size. The  respirophasic diameter changes were in the normal range (>= 50%),  consistent with normal central venous pressure.  EEG - Clinical Interpretation: This normal EEG is recorded in the waking and sleep state. There was no seizure or seizure predisposition recorded on this study. Please note that a normal EEG does not preclude the possibility of epilepsy.   REVIEW OF SYSTEMS: Constitutional: No fevers, chills, sweats, or change in appetite Eyes: No visual changes, double vision, eye pain Ear, nose and throat: No hearing loss, ear pain, nasal congestion, sore throat Cardiovascular: No chest pain, palpitations Respiratory: No shortness of breath at rest or with exertion.   No wheezes GastrointestinaI: No nausea, vomiting, diarrhea, abdominal pain, fecal incontinence Genitourinary: No dysuria, urinary retention or frequency.  No nocturia. Musculoskeletal: No neck pain, back pain Integumentary: No rash, pruritus, skin lesions Neurological: as above Psychiatric: No depression at this time.  No anxiety Endocrine: No palpitations, diaphoresis, change in appetite, change in weigh or increased thirst Hematologic/Lymphatic: No anemia, purpura, petechiae. Allergic/Immunologic: No itchy/runny eyes, nasal congestion, recent allergic reactions, rashes  ALLERGIES: No Known Allergies  HOME  MEDICATIONS:  Current outpatient prescriptions:  .  amLODipine (NORVASC) 10 MG tablet, Take 1 tablet (10 mg total) by mouth daily., Disp: 30 tablet, Rfl: 0 .  aspirin EC 325 MG EC tablet, Take 1 tablet (325 mg total) by mouth daily., Disp: 30 tablet, Rfl: 0 .  atorvastatin (LIPITOR) 10 MG tablet, Take 1 tablet (10 mg total) by mouth daily at 6 PM., Disp: 30 tablet, Rfl: 1 .  butalbital-acetaminophen-caffeine (FIORICET, ESGIC) 50-325-40 MG tablet, Take 1 tablet by mouth every 8 (eight) hours as needed for headache., Disp: 60 tablet, Rfl: 0 .  chlorthalidone (HYGROTON) 25 MG tablet, Take 1 tablet (25 mg total) by mouth daily., Disp: 30 tablet, Rfl: 1 .  clopidogrel (PLAVIX) 75 MG tablet, Take 1 tablet (75 mg total) by mouth daily., Disp: 30 tablet, Rfl: 0 .  divalproex (DEPAKOTE) 250 MG DR tablet, Take 3 tablets (750 mg total) by mouth every 8 (eight) hours., Disp: 90 tablet, Rfl: 2 .  escitalopram (LEXAPRO) 10 MG tablet, Take 1 tablet (10 mg total) by mouth daily., Disp: 30 tablet, Rfl: 1 .  fluticasone (FLONASE) 50 MCG/ACT nasal spray, Place  2 sprays into the nose daily. (Patient taking differently: Place 2 sprays into the nose daily as needed for allergies. ), Disp: 16 g, Rfl: 0 .  lisinopril (PRINIVIL,ZESTRIL) 40 MG tablet, Take 40 mg by mouth daily., Disp: , Rfl:  .  metoprolol (LOPRESSOR) 50 MG tablet, Take 1 tablet (50 mg total) by mouth 2 (two) times daily., Disp: 60 tablet, Rfl: 1  PAST MEDICAL HISTORY: Past Medical History  Diagnosis Date  . Chest pain, atypical     12/2003:  negative cardiolyte  . HTN (hypertension)   . HLD (hyperlipidemia)   . Depression   . Hx of tear of ACL (anterior cruciate ligament)     right  . History of meniscal tear     bilateral  . Erectile dysfunction   . Insomnia   . Eczema   . Olecranon bursitis of left elbow 10/2009    s/p I&D by Dr Maxie Better, initially assessed by Dr. Nori Riis   . Allergy   . Anxiety   . Cataract   . Prediabetes   . TIA (transient  ischemic attack)     PAST SURGICAL HISTORY: Past Surgical History  Procedure Laterality Date  . Skin tag removal      11 removed  . Knee arthroscopy    . Incise and drain abcess      L elbow due to cellulitis/bursitis  . Inner ear surgery    . Radiology with anesthesia N/A 09/20/2015    Procedure: EMBOLIZATION         (RADIOLOGY WITH ANESTHESIA);  Surgeon: Luanne Bras, MD;  Location: [facility];  Service: Radiology;  Laterality: N/A;    FAMILY HISTORY: Family History  Problem Relation Age of Onset  . Colon cancer Neg Hx   . Rectal cancer Neg Hx   . Stomach cancer Neg Hx   . Stroke Mother   . Hypertension Mother   . Aneurysm Mother 30    Died of brain aneursym  . Heart failure Father   . Emphysema Father   . Diabetes Mellitus II Sister     SOCIAL HISTORY:  Social History   Social History  . Marital Status: Divorced    Spouse Name: N/A  . Number of Children: N/A  . Years of Education: N/A   Occupational History  . Not on file.   Social History Main Topics  . Smoking status: Never Smoker   . Smokeless tobacco: Never Used  . Alcohol Use: No  . Drug Use: No  . Sexual Activity: Not on file   Other Topics Concern  . Not on file   Social History Narrative   Separated, has 2 daughters, works as a Forensic psychologist, Therapist, occupational     PHYSICAL EXAM  Filed Vitals:   10/15/15 0918  BP: 111/68  Pulse: 60  Height: 5\' 8"  (1.727 m)  Weight: 252 lb (114.306 kg)    Body mass index is 38.33 kg/(m^2).   General: The patient is well-developed and well-nourished and in no acute distress  Eyes:  Funduscopic exam shows normal optic discs and retinal vessels.  Neck: The neck is supple, no carotid bruits are noted.  The neck is nontender.  Cardiovascular: The heart has a regular rate and rhythm with a normal S1 and S2. There were no murmurs, gallops or rubs. Lungs are clear to auscultation.  Skin: Extremities are without significant edema.  Musculoskeletal:   Back is nontender  Neurologic Exam  Mental status: The patient is alert and oriented  x 3 at the time of the examination. The patient has apparent normal recent and remote memory, with an apparently normal attention span and concentration ability.   Speech is normal.  Cranial nerves: Extraocular movements are full. Pupils are equal, round, and reactive to light and accomodation.  Visual fields are full.  Facial symmetry is present. There is good facial sensation to soft touch bilaterally.Facial strength is normal.  Trapezius and sternocleidomastoid strength is normal. No dysarthria is noted.  The tongue is midline, and the patient has symmetric elevation of the soft palate. He has a left hearing loss but can feel the tuning fork on the mastoid well.  Motor:  Muscle bulk is normal.   Tone is normal. Strength is  5 / 5 in all 4 extremities.   Sensory: Sensory testing is intact to pinprick, soft touch and vibration sensation in all 4 extremities.  Coordination: Cerebellar testing reveals good finger-nose-finger and heel-to-shin bilaterally.  Gait and station: Station is normal.   Gait is normal. Tandem gait is wide. Romberg is negative.   Reflexes: Deep tendon reflexes are symmetric and normal bilaterally.   Plantar responses are flexor.    DIAGNOSTIC DATA (LABS, IMAGING, TESTING) - I reviewed patient records, labs, notes, testing and imaging myself where available.  Lab Results  Component Value Date   WBC 10.0 09/24/2015   HGB 13.5 09/24/2015   HCT 40.7 09/24/2015   MCV 86.4 09/24/2015   PLT 317 09/24/2015      Component Value Date/Time   NA 139 09/24/2015 0705   K 3.5 09/24/2015 0705   CL 101 09/24/2015 0705   CO2 25 09/24/2015 0705   GLUCOSE 83 09/24/2015 0705   BUN 15 09/24/2015 0705   CREATININE 1.02 09/24/2015 0705   CREATININE 0.83 08/24/2010 1141   CALCIUM 8.9 09/24/2015 0705   PROT 6.6 09/24/2015 0705   ALBUMIN 3.3* 09/24/2015 0705   AST 21 09/24/2015 0705   ALT 36  09/24/2015 0705   ALKPHOS 66 09/24/2015 0705   BILITOT 0.7 09/24/2015 0705   GFRNONAA >60 09/24/2015 0705   GFRAA >60 09/24/2015 0705   Lab Results  Component Value Date   CHOL 158 09/22/2015   HDL 34* 09/22/2015   LDLCALC 95 09/22/2015   TRIG 145 09/22/2015   CHOLHDL 4.6 09/22/2015   Lab Results  Component Value Date   HGBA1C 6.1* 09/22/2015   No results found for: [id number] Lab Results  Component Value Date   TSH 0.589 03/01/2007       ASSESSMENT AND PLAN  Cerebral infarction due to embolism of right middle cerebral artery (HCC)  Brain aneurysm  Migraine with aura and without status migrainosus, not intractable  Hearing loss, left  Gait disturbance, post-stroke  Transient neurologic deficit   In summary, [name] is a 60 year old man who was found to have an ACA aneurysm and had a few small scattered right-sided strokes afterwards.  He continues to improve and is getting closer to his baseline.  He had episodes of transient left-sided neurologic symptoms the night after his procedure. Activities more likely to represent event from vasospasm than seizures. However, as we cannot be certain, it is reasonable to stay on the Depakote for a while longer. Additionally, the Depakote may help his headaches.    As I think it is more likely that the spells were vasospasm related to his procedure rather than seizures, he continues to do well he should be safe to drive. I will simplify his Depakote therapy but  changing him to 1000 mg ER twice a day.  He has been taking the Fioricet 3 times a day, every day. I have advised him to taper himself off over the next week and to only use it when he actually has a headache.   He will stay on aspirin plus Plavix for the recent pipeline stent placement.  He will return to see me in 3-4 months or sooner if there are new or worsening neurologic symptoms.  Thank you for asking me to see Mr. Wachholz for neurologic consultation. Please let me  know positive be of further assistance with her or other patients in the future.  Secret Kristensen A. Felecia Shelling, MD, PhD [id number], [id number] AM Certified in Neurology, Clinical Neurophysiology, Sleep Medicine, Pain Medicine and Neuroimaging  [facility] [address] [place], [place] [address] [phone]

## 2015-10-26 ENCOUNTER — Telehealth (HOSPITAL_COMMUNITY): Payer: Self-pay

## 2015-10-26 ENCOUNTER — Telehealth (HOSPITAL_COMMUNITY): Payer: Self-pay | Admitting: *Deleted

## 2015-10-26 NOTE — Telephone Encounter (Signed)
Melchor Amour called patient and told him the need to reschedule,  Call dropped, she called back and left message with reschedule time of 11/04/15 at 12 asked that he call back to confirm

## 2015-10-29 ENCOUNTER — Ambulatory Visit (HOSPITAL_COMMUNITY): Admission: RE | Admit: 2015-10-29 | Payer: Managed Care, Other (non HMO) | Source: Ambulatory Visit

## 2015-10-30 ENCOUNTER — Other Ambulatory Visit: Payer: Self-pay | Admitting: Physical Medicine & Rehabilitation

## 2015-11-04 ENCOUNTER — Inpatient Hospital Stay (HOSPITAL_COMMUNITY): Admission: RE | Admit: 2015-11-04 | Payer: Managed Care, Other (non HMO) | Source: Ambulatory Visit

## 2015-11-12 ENCOUNTER — Encounter: Payer: Self-pay | Admitting: Physical Medicine & Rehabilitation

## 2015-11-12 ENCOUNTER — Encounter: Payer: Managed Care, Other (non HMO) | Attending: Physical Medicine & Rehabilitation

## 2015-11-12 ENCOUNTER — Ambulatory Visit (HOSPITAL_BASED_OUTPATIENT_CLINIC_OR_DEPARTMENT_OTHER): Payer: Managed Care, Other (non HMO) | Admitting: Physical Medicine & Rehabilitation

## 2015-11-12 VITALS — BP 129/79 | HR 70 | Resp 14

## 2015-11-12 DIAGNOSIS — I63411 Cerebral infarction due to embolism of right middle cerebral artery: Secondary | ICD-10-CM | POA: Diagnosis not present

## 2015-11-12 DIAGNOSIS — N529 Male erectile dysfunction, unspecified: Secondary | ICD-10-CM | POA: Insufficient documentation

## 2015-11-12 DIAGNOSIS — I69398 Other sequelae of cerebral infarction: Secondary | ICD-10-CM | POA: Diagnosis present

## 2015-11-12 DIAGNOSIS — I10 Essential (primary) hypertension: Secondary | ICD-10-CM | POA: Insufficient documentation

## 2015-11-12 DIAGNOSIS — F329 Major depressive disorder, single episode, unspecified: Secondary | ICD-10-CM | POA: Diagnosis not present

## 2015-11-12 DIAGNOSIS — G4700 Insomnia, unspecified: Secondary | ICD-10-CM | POA: Diagnosis not present

## 2015-11-12 DIAGNOSIS — Z8673 Personal history of transient ischemic attack (TIA), and cerebral infarction without residual deficits: Secondary | ICD-10-CM

## 2015-11-12 DIAGNOSIS — E785 Hyperlipidemia, unspecified: Secondary | ICD-10-CM | POA: Diagnosis not present

## 2015-11-12 DIAGNOSIS — G43109 Migraine with aura, not intractable, without status migrainosus: Secondary | ICD-10-CM | POA: Insufficient documentation

## 2015-11-12 DIAGNOSIS — R269 Unspecified abnormalities of gait and mobility: Secondary | ICD-10-CM | POA: Diagnosis present

## 2015-11-12 DIAGNOSIS — R7303 Prediabetes: Secondary | ICD-10-CM | POA: Insufficient documentation

## 2015-11-12 NOTE — Patient Instructions (Signed)
Do not recommend taking ibuprofen  Instead take either 3 regular strength Tylenol or 2 extra strength Tylenol for pain and stiffness.

## 2015-11-12 NOTE — Progress Notes (Signed)
Subjective:    Patient ID: [name], male    DOB: 1955-10-20, 60 y.o.   MRN: [id number] 60 year old right-handed male, with history of hypertension, migraine headaches, who lives alone independent prior to admission.  Presented with numerous ED evaluations for headaches.  Placed on Depakote for headache prevention.  Admitted on September 14, 2015 with facial numbness and headache.  MRI of the brain showed no acute intracranial process.  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  No emergent large vessel occlusion.  Cerebral angiogram again showed large right aneurysm, underwent stenting on September 20, 2015 per Interventional Radiology.  EEG showed no seizure.  Followup MRI after stenting showed 3 small areas of acute infarct in the right MCA territory.  Echocardiogram ejection fraction of 65%.  No wall motion abnormalities.  Neurology consulted, maintained on aspirin and Plavix therapy. DATE OF ADMISSION:  09/23/2015 DATE OF DISCHARGE:  09/29/2015  HPI   Feels essentially normal now.  Released to drive per Neuro , no issues. Occ memory issues  Independent with all self-care and mobility. Independent with all house work. Pain Inventory Average Pain 0 Pain Right Now 0 My pain is NA  In the last 24 hours, has pain interfered with the following? General activity 0 Relation with others 0 Enjoyment of life 0 What TIME of day is your pain at its worst? NA Sleep (in general) NA  Pain is worse with: NA Pain improves with: NA Relief from Meds: NA  Mobility walk without assistance Do you have any goals in this area?  no  Function employed # of hrs/week 40 Do you have any goals in this area?  no  Neuro/Psych No problems in this area  Prior Studies Any changes since last visit?  no  Physicians involved in your care Any changes since last visit?  no   Family History  Problem Relation Age of Onset  . Colon cancer Neg Hx   . Rectal  cancer Neg Hx   . Stomach cancer Neg Hx   . Stroke Mother   . Hypertension Mother   . Aneurysm Mother 46    Died of brain aneursym  . Heart failure Father   . Emphysema Father   . Diabetes Mellitus II Sister    Social History   Social History  . Marital Status: Divorced    Spouse Name: N/A  . Number of Children: N/A  . Years of Education: N/A   Social History Main Topics  . Smoking status: Never Smoker   . Smokeless tobacco: Never Used  . Alcohol Use: No  . Drug Use: No  . Sexual Activity: Not Asked   Other Topics Concern  . 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 Skin tag removal      11 removed  . Knee arthroscopy    . Incise and drain abcess      L elbow due to cellulitis/bursitis  . Inner ear surgery    . Radiology with anesthesia N/A 09/20/2015    Procedure: EMBOLIZATION         (RADIOLOGY WITH ANESTHESIA);  Surgeon: Luanne Bras, MD;  Location: [facility];  Service: Radiology;  Laterality: N/A;   Past Medical History  Diagnosis Date  . Chest pain, atypical  12/2003:  negative cardiolyte  . HTN (hypertension)   . HLD (hyperlipidemia)   . Depression   . Hx of tear of ACL (anterior cruciate ligament)     right  . History of meniscal tear     bilateral  . Erectile dysfunction   . Insomnia   . Eczema   . Olecranon bursitis of left elbow 10/2009    s/p I&D by Dr Maxie Better, initially assessed by Dr. Nori Riis   . Allergy   . Anxiety   . Cataract   . Prediabetes   . TIA (transient ischemic attack)    BP 129/79 mmHg  Pulse 70  Resp 14  SpO2 94%  Opioid Risk Score:   Fall Risk Score:  `1  Depression screen PHQ 2/9  Depression screen [facility] 2/9 11/12/2015 10/12/2015 03/08/2011 08/24/2010  Decreased Interest 0 0 0 0  Down, Depressed, Hopeless 0 3 0 0  PHQ - 2 Score 0 3 0 0  Altered  sleeping - 3 - -  Tired, decreased energy - 3 - -  Change in appetite - 0 - -  Feeling bad or failure about yourself  - 1 - -  Trouble concentrating - 0 - -  Moving slowly or fidgety/restless - 0 - -  Suicidal thoughts - 0 - -  PHQ-9 Score - 10 - -  Difficult doing work/chores - Somewhat difficult - -       Review of Systems  All other systems reviewed and are negative.      Objective:   Physical Exam  Constitutional: He is oriented to person, place, and time. He appears well-developed and well-nourished.  HENT:  Head: Normocephalic and atraumatic.  Eyes: Conjunctivae and EOM are normal. Pupils are equal, round, and reactive to light.  Neck: Normal range of motion.  Neurological: He is alert and oriented to person, place, and time.  Psychiatric: He has a normal mood and affect.  Nursing note and vitals reviewed.   Neuro:  Eyes without evidence of nystagmus  Tone is normal without evidence of spasticity Cerebellar exam shows no evidence of ataxia on finger nose finger or heel to shin testing No evidence of trunkal ataxia With Romberg Although patient feels a bit unsteady  Patient does have difficulty with tandem gait is able to walk with feet slightly apart. Able to toe walk.   Motor strength is 5/5 in bilateral deltoid, biceps, triceps, finger flexors and extensors, wrist flexors and extensors, hip flexors, knee flexors and extensors, ankle dorsiflexors, plantar flexors, invertors and evertors, toe flexors and extensors  Sensory exam is normal to pinprick, proprioception and light touch in the upper and lower limbs   Cranial nerves II- Visual fields are intact to confrontation testing, no blurring of vision III- no evidence of ptosis, upward, downward and medial gaze intact IV- no vertical diplopia or head tilt V- no facial numbness or masseter weakness VI- no pupil abduction weakness VII- no facial droop, good lid closure VII- normal auditory acuity IX- no  pharygeal weakness, gag nl X- no pharyngeal weakness, no hoarseness XI- no trap or SCM weakness XII- no glossal weakness        Assessment & Plan:  1. Small right MCA infarcts after stenting of aneurysm. No residual neurologic deficits. Patient states that his problems with tandem gait are long-term due to inner ear issues. Appreciate neurology note. He is back to driving. He would like to return to work. I do think he should be healed to return without  restrictions given his lack of neurologic deficits. I have written a letter to that effect. I will see the patient back on an as needed basis

## 2015-11-18 ENCOUNTER — Telehealth (HOSPITAL_COMMUNITY): Payer: Self-pay

## 2015-11-18 ENCOUNTER — Ambulatory Visit (HOSPITAL_COMMUNITY): Payer: Managed Care, Other (non HMO) | Attending: Radiology

## 2015-11-18 NOTE — Telephone Encounter (Signed)
Called to reschedule f/u, left message for pt to return call. AW

## 2015-11-22 NOTE — Consult Note (Signed)
Requesting Physician: Dr. Lovena Neighbours      Reason for consultation:  To evaluate left ACA aneurysm  HPI:                                                                                                                                         Patient is a 60 y.o. male with remote history of Migraine headaches, hypertension, diabetes who presented to the ED on 4/18 for worsening headache and right facial numbness. Per patient, his headache was throbbing, 8/10 at its worst, associated with some nausea and had zigzag lines in his visual field. 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 Neurology is consulted for further recommendations.  Past Medical History: Past Medical History  Diagnosis Date  . Chest pain, atypical     12/2003:  negative cardiolyte  . HTN (hypertension)   . HLD (hyperlipidemia)   . Depression   . Hx of tear of ACL (anterior cruciate ligament)     right  . History of meniscal tear     bilateral  . Erectile dysfunction   . Insomnia   . Eczema   . Olecranon bursitis of left elbow 10/2009    s/p I&D by Dr Maxie Better, initially assessed by Dr. Nori Riis   . Allergy   . Anxiety   . Cataract   . Prediabetes   . TIA (transient ischemic attack)     Past Surgical History  Procedure Laterality Date  . Skin tag removal      11 removed  . Knee arthroscopy    . Incise and drain abcess      L elbow due to cellulitis/bursitis  . Inner ear surgery    . Radiology with anesthesia N/A 09/20/2015    Procedure: EMBOLIZATION         (RADIOLOGY WITH ANESTHESIA);  Surgeon: Luanne Bras, MD;  Location: [place] [facility];  Service: Radiology;  Laterality: N/A;    Family History: Family History  Problem Relation Age of Onset  . Colon cancer Neg Hx   . Rectal cancer Neg Hx   . Stomach cancer Neg Hx   . Stroke Mother   . Hypertension Mother   . Aneurysm Mother 93    Died of brain aneursym  . Heart failure Father   . Emphysema  Father   . Diabetes Mellitus II Sister     Social History:   reports that he has never smoked. He has never used smokeless tobacco. He reports that he does not drink alcohol or use illicit drugs.  Allergies:  No Known Allergies   Medications:  No current facility-administered medications for this encounter.  Current outpatient prescriptions:  .  fluticasone (FLONASE) 50 MCG/ACT nasal spray, Place 2 sprays into the nose daily. (Patient taking differently: Place 2 sprays into the nose daily as needed for allergies. ), Disp: 16 g, Rfl: 0 .  amLODipine (NORVASC) 10 MG tablet, Take 1 tablet (10 mg total) by mouth daily., Disp: 30 tablet, Rfl: 0 .  aspirin EC 325 MG EC tablet, Take 1 tablet (325 mg total) by mouth daily., Disp: 30 tablet, Rfl: 0 .  atorvastatin (LIPITOR) 10 MG tablet, Take 1 tablet (10 mg total) by mouth daily at 6 PM., Disp: 30 tablet, Rfl: 1 .  butalbital-acetaminophen-caffeine (FIORICET, ESGIC) 50-325-40 MG tablet, Take 1 tablet by mouth every 8 (eight) hours as needed for headache., Disp: 60 tablet, Rfl: 0 .  chlorthalidone (HYGROTON) 25 MG tablet, Take 1 tablet (25 mg total) by mouth daily., Disp: 30 tablet, Rfl: 1 .  clopidogrel (PLAVIX) 75 MG tablet, Take 1 tablet (75 mg total) by mouth daily., Disp: 30 tablet, Rfl: 0 .  divalproex (DEPAKOTE ER) 500 MG 24 hr tablet, Take 2 tablets (1,000 mg total) by mouth daily., Disp: 120 tablet, Rfl: 5 .  escitalopram (LEXAPRO) 10 MG tablet, Take 1 tablet (10 mg total) by mouth daily., Disp: 30 tablet, Rfl: 1 .  lisinopril (PRINIVIL,ZESTRIL) 40 MG tablet, Take 40 mg by mouth daily., Disp: , Rfl:  .  metoprolol (LOPRESSOR) 50 MG tablet, Take 1 tablet (50 mg total) by mouth 2 (two) times daily., Disp: 60 tablet, Rfl: 1   ROS:                                                                                                                                        History obtained from the patient  General ROS: negative for - chills, fatigue, fever, night sweats, weight gain or weight loss Psychological ROS: negative for - behavioral disorder, hallucinations, memory difficulties, mood swings or suicidal ideation Ophthalmic ROS: negative for - blurry vision, double vision, eye pain or loss of vision ENT ROS: negative for - epistaxis, nasal discharge, oral lesions, sore throat, tinnitus or vertigo Allergy and Immunology ROS: negative for - hives or itchy/watery eyes Hematological and Lymphatic ROS: negative for - bleeding problems, bruising or swollen lymph nodes Endocrine ROS: negative for - galactorrhea, hair pattern changes, polydipsia/polyuria or temperature intolerance Respiratory ROS: negative for - cough, hemoptysis, shortness of breath or wheezing Cardiovascular ROS: negative for - chest pain, dyspnea on exertion, edema or irregular heartbeat Gastrointestinal ROS: negative for - abdominal pain, diarrhea, hematemesis, nausea/vomiting or stool incontinence Genito-Urinary ROS: negative for - dysuria, hematuria, incontinence or urinary frequency/urgency Musculoskeletal ROS: negative for - joint swelling or muscular weakness Neurological ROS: as noted in HPI Dermatological ROS: negative for rash and skin lesion changes  Neurologic Examination:  Today's Vitals   09/17/15 0000 09/17/15 0257 09/17/15 0614 09/17/15 1014  BP: 125/71 110/64 143/86 143/74  Pulse: 61 76 71 81  Temp:  98.1 F (36.7 C) 98.1 F (36.7 C) 97 F (36.1 C)  TempSrc:  Oral Oral Oral  Resp: 16 14 16 18   Height:      Weight:      SpO2: 98% 96% 98% 97%  PainSc:        Evaluation of higher integrative functions including: Level of alertness: Alert,  Oriented to time, place and person Speech: fluent, no evidence of dysarthria or aphasia noted.   Test the following cranial nerves: 2-12 grossly intact Motor examination: Normal tone, bulk, full 5/5 motor strength in all 4 extremities Examination of sensation : Normal and symmetric sensation proximally, diabetic polyneuropathy distally noted. Normal on face Examination of deep tendon reflexes: 1+, symmetric in all extremities, normal plantars bilaterally Test coordination: Normal finger nose testing, with no evidence of limb appendicular ataxia or abnormal involuntary movements or tremors noted.     Lab Results: Basic Metabolic Panel: No results for input(s): NA, K, CL, CO2, GLUCOSE, BUN, CREATININE, CALCIUM, MG, PHOS in the last 168 hours.  Liver Function Tests: No results for input(s): AST, ALT, ALKPHOS, BILITOT, PROT, ALBUMIN in the last 168 hours. No results for input(s): LIPASE, AMYLASE in the last 168 hours. No results for input(s): AMMONIA in the last 168 hours.  CBC: No results for input(s): WBC, NEUTROABS, HGB, HCT, MCV, PLT in the last 168 hours.  Cardiac Enzymes: No results for input(s): CKTOTAL, CKMB, CKMBINDEX, TROPONINI in the last 168 hours.  Lipid Panel: No results for input(s): CHOL, TRIG, HDL, CHOLHDL, VLDL, LDLCALC in the last 168 hours.  CBG: No results for input(s): GLUCAP in the last 168 hours.  Microbiology: Results for orders placed or performed during the hospital encounter of 09/14/15  Surgical pcr screen     Status: Abnormal   Collection Time: 09/15/15 11:34 PM  Result Value Ref Range Status   MRSA, PCR NEGATIVE NEGATIVE Final   Staphylococcus aureus POSITIVE (A) NEGATIVE Final    Comment:        The Xpert SA Assay (FDA approved for NASAL specimens in patients over 37 years of age), is one component of a comprehensive surveillance program.  Test performance has been validated by [facility] for patients greater than or equal to 34 year old. It is not intended to diagnose infection nor to guide or monitor treatment.      Assessment  and plan:   Patient is a 60 y.o. male with remote history of Migraine headaches, hypertension, diabetes who presented to the ED on 4/18 for worsening headache and right facial numbness. Per patient, his headache was throbbing, 8/10 at its worst, associated with some nausea and had zigzag lines in his visual field. 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 Neurology consulted, awaiting further recommendations.  Her presentation is suggestive of a complex migraine episode, no stroke on MRI brain.  Incidental ACA aneurysm noted on my review. Discussed case with Dr. Estanislado Pandy. He is planning to perform a cerebral arteriogram for further evaluation during this hospitalization. Will defer further treatment recommendations for the aneurysm to Dr. Estanislado Pandy Blood pressure control, keep goal systolic's under [id number].   No further recommendations from neurology. Please call if any questions.

## 2015-12-13 ENCOUNTER — Other Ambulatory Visit: Payer: Self-pay | Admitting: Physical Medicine & Rehabilitation

## 2015-12-15 ENCOUNTER — Telehealth: Payer: Self-pay | Admitting: Neurology

## 2015-12-15 NOTE — Telephone Encounter (Signed)
I have spoken with [name] this am and gave appt. 0900 12-16-15/fim

## 2015-12-15 NOTE — Telephone Encounter (Signed)
Pt called in about new numbness and tingleling in arms and hands. It started last Friday. Pt states it does not seem to be positional that it happens randomly. Also, it may be arms and some fingers or only one hand. He says that it is different all the time and it may last up to 30 min. I offered an appt for July 24th to see Dr. Felecia Shelling but pt wants to be seen sooner. Pt feels he may just go to the ER if he can not come in sooner. Pt said he would wait to hear from the nurse about either coming in today or tomorrow. Pt said he will wait till 3 or 4p today before he goes to ER. Please call [phone]

## 2015-12-16 ENCOUNTER — Telehealth: Payer: Self-pay | Admitting: Neurology

## 2015-12-16 ENCOUNTER — Encounter: Payer: Self-pay | Admitting: Neurology

## 2015-12-16 ENCOUNTER — Ambulatory Visit (INDEPENDENT_AMBULATORY_CARE_PROVIDER_SITE_OTHER): Payer: Managed Care, Other (non HMO) | Admitting: Neurology

## 2015-12-16 VITALS — BP 128/64 | HR 68 | Resp 22 | Ht 68.0 in | Wt 256.0 lb

## 2015-12-16 DIAGNOSIS — I63421 Cerebral infarction due to embolism of right anterior cerebral artery: Secondary | ICD-10-CM

## 2015-12-16 DIAGNOSIS — F411 Generalized anxiety disorder: Secondary | ICD-10-CM | POA: Diagnosis not present

## 2015-12-16 DIAGNOSIS — I671 Cerebral aneurysm, nonruptured: Secondary | ICD-10-CM

## 2015-12-16 DIAGNOSIS — G5603 Carpal tunnel syndrome, bilateral upper limbs: Secondary | ICD-10-CM

## 2015-12-16 DIAGNOSIS — G43009 Migraine without aura, not intractable, without status migrainosus: Secondary | ICD-10-CM

## 2015-12-16 MED ORDER — WRIST SPLINT LEFT/RIGHT MISC: Status: DC

## 2015-12-16 MED ORDER — BUTALBITAL-APAP-CAFFEINE 50-325-40 MG PO TABS: ORAL_TABLET | ORAL | Status: DC

## 2015-12-16 MED ORDER — ESCITALOPRAM OXALATE 20 MG PO TABS: 20.0000 mg | ORAL_TABLET | Freq: Every day | ORAL | Status: DC

## 2015-12-16 MED ORDER — ESCITALOPRAM OXALATE 20 MG PO TABS: 10.0000 mg | ORAL_TABLET | Freq: Every day | ORAL | Status: DC

## 2015-12-16 NOTE — Telephone Encounter (Signed)
[place] [phone] called, Rx received for Lexapro, patient thought he had 2 Rx's, 1 for Lexapro and 1 for pain medicine, patient is there now, please call to advise.

## 2015-12-16 NOTE — Telephone Encounter (Signed)
I have spoken with Estill Bamberg at [employer].  1--Rx's for Fioricet and cock up wrist splints faxed to her.  2-Lexapro rx. refaxed with correct directions/fim

## 2015-12-16 NOTE — Progress Notes (Signed)
[facility]  PATIENT: [name] DOB: [birth year]  REFERRING DOCTOR OR PCP:  Eldridge Abrahams (PCP)   Her phone is [phone]  _________________________________   HISTORICAL  CHIEF COMPLAINT:  Chief Complaint  Patient presents with  . Hx of CVA    Sts. intermittent numbness in bilat hands, fingers is worse.  Wakes him up at night.  Sts. is taking Plavix, ASA as rx'd/fim  . Hx of Aneurysm Repair    HISTORY OF PRESENT ILLNESS:   [name]  is a 60 yo man with h/o migraines since he was young.   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    Numbness:    He wakes up with tingling in his hands, especially the fingertips that can last 15 minutes.   This occurs 1-3 times a night.     He also notes it during the day, sometimes while sitting or driving.     He notes it most in the 2nd, 3rd and 4th fingers   HA:    A typical headache had an aura with tunnel vision x 15 minutes followed by a throbbing headache.  He had many when he was youg but less in middle age until the past couple years when they occurred every few months.   He had many more in March (every other day)  Currently, the left side is back to or close to baseline in strength.   He feels slightly clumsy on the left side and feels he is still improving.   Balance is better.    His numbness has resolved.    Speech is normal.    He notes some decreased peripheral vision on his left.      He has not had any headaches the past 10 days.      Aneurysm/stroke history:   In March, he had an episode of altered vision with confusion and left arm numbness. He had a CT scan and was told he had a TIA.   After that he had more frequent headache.   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   He had a pipeline stent device placed procedure 09/20/15.    That night, he had multiple (3) episodes with slurred  speech, confusion and left arm numbness and weakness.  He ws treated with saline boluses and blood pressure was titrated to systolic [id number] to [id number] with Cardene.   The next day, he had a tingling mouth and spasms in his lips.   He also had cramping in his left hand.   He had changes in his medications    He had an MRI 09/21/15 showing several  punctate right hemispheric acute strokes on DWI.      He was on IV heparin for the next week.    He did PT/OT and the left side improved but not to baseline.    He had several episodes of numbness on the left with left hand clenching lasting seconds to minutes at a time.   The EEG 09/22/15 was normal.     Data reviewed: Imaging and laboratory reports were reviewed. Multiple doctor notes from his April admission and rehabilitation notes were reviewed, summarized above.   EEG and other studies reviewed. Actual MRI images and CT images were reviewed on PACS  IMAGING I have personally reviewed the radiological images below and agree with the radiology interpretations  MRI HEAD 09/15/15: No acute intracranial process.  Susceptibility artifact LEFT caudate head could represent a cavernoma.  Moderate chronic small vessel ischemic disease.  MRA HEAD 09/15/15: Moderately motion degraded examination.  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 Poorly visualized RIGHT A2 segment. Recommend CTA head for further Characterization.     No emergent large vessel occlusion or high-grade stenosis though motion limits assessment.Ct Head Wo Contrast  09/20/2015 1. No evidence of acute intracranial abnormality following recent endovascular pericallosal aneurysm treatment. 2. Mild chronic small vessel ischemic disease.   Ct Angio Head & Neck W/cm &/or Wo/cm 09/20/2015 1. No large or proximal arterial branch occlusion. No complication status post recent catheter directed arteriogram. 2. 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  3. Mild intracranial and extracranial atheromatous disease as above.   Mr Brain Wo Contrast 09/21/2015 Three small areas of acute infarct in the right MCA territory likely areas of embolic infarction. Pipeline stent left anterior cerebral artery. No evidence of acute intracranial hemorrhage.   TTE - - Left ventricle: The cavity size was normal. Systolic function was  normal. The estimated ejection fraction was in the range of 60%  to 65%. Wall motion was normal; there were no regional wall  motion abnormalities. Left ventricular diastolic function  parameters were normal. - Mitral valve: There was no regurgitation. - Right ventricle: The cavity size was normal. Wall thickness was  normal. Systolic function was normal. - Atrial septum: No defect or patent foramen ovale was identified  by color flow Doppler. - Tricuspid valve: There was no regurgitation. - Inferior vena cava: The vessel was normal in size. The  respirophasic diameter changes were in the normal range (>= 50%),  consistent with normal central venous pressure.  EEG - Clinical Interpretation: This normal EEG is recorded in the waking and sleep state. There was no seizure or seizure predisposition recorded on this study. Please note that a normal EEG does not preclude the possibility of epilepsy.   REVIEW OF SYSTEMS: Constitutional: No fevers, chills, sweats, or change in appetite Eyes: No visual changes, double vision, eye pain Ear, nose and throat: No hearing loss, ear pain, nasal congestion, sore throat Cardiovascular: No chest pain, palpitations Respiratory: No shortness of breath at rest or with exertion.   No wheezes GastrointestinaI: No nausea, vomiting, diarrhea, abdominal pain, fecal incontinence Genitourinary: No dysuria, urinary retention or frequency.  No nocturia. Musculoskeletal: No neck pain, back pain Integumentary: No rash, pruritus, skin lesions Neurological: as above Psychiatric: No depression  at this time.  No anxiety Endocrine: No palpitations, diaphoresis, change in appetite, change in weigh or increased thirst Hematologic/Lymphatic: No anemia, purpura, petechiae. Allergic/Immunologic: No itchy/runny eyes, nasal congestion, recent allergic reactions, rashes  ALLERGIES: No Known Allergies  HOME MEDICATIONS:  Current outpatient prescriptions:  .  amLODipine (NORVASC) 10 MG tablet, Take 1 tablet (10 mg total) by mouth daily., Disp: 30 tablet, Rfl: 0 .  aspirin EC 325 MG EC tablet, Take 1 tablet (325 mg total) by mouth daily., Disp: 30 tablet, Rfl: 0 .  atorvastatin (LIPITOR) 10 MG tablet, Take 1 tablet (10 mg total) by mouth daily at 6 PM., Disp: 30 tablet, Rfl: 1 .  butalbital-acetaminophen-caffeine (FIORICET, ESGIC) 50-325-40 MG tablet, TAKE ONE TABLET BY MOUTH EVERY 8 HOURS AS NEEDED FOR HEADACHE, Disp: 60 tablet, Rfl: 0 .  chlorthalidone (HYGROTON) 25 MG tablet, Take 1 tablet (25 mg total) by mouth daily., Disp: 30 tablet, Rfl: 1 .  clopidogrel (  PLAVIX) 75 MG tablet, Take 1 tablet (75 mg total) by mouth daily., Disp: 30 tablet, Rfl: 0 .  escitalopram (LEXAPRO) 10 MG tablet, Take 1 tablet (10 mg total) by mouth daily., Disp: 30 tablet, Rfl: 1 .  fluticasone (FLONASE) 50 MCG/ACT nasal spray, Place 2 sprays into the nose daily. (Patient taking differently: Place 2 sprays into the nose daily as needed for allergies. ), Disp: 16 g, Rfl: 0 .  lisinopril (PRINIVIL,ZESTRIL) 40 MG tablet, Take 40 mg by mouth daily., Disp: , Rfl:  .  metoprolol (LOPRESSOR) 50 MG tablet, Take 1 tablet (50 mg total) by mouth 2 (two) times daily., Disp: 60 tablet, Rfl: 1 .  divalproex (DEPAKOTE ER) 500 MG 24 hr tablet, Take 2 tablets (1,000 mg total) by mouth daily. (Patient not taking: Reported on 12/16/2015), Disp: 120 tablet, Rfl: 5  PAST MEDICAL HISTORY: Past Medical History  Diagnosis Date  . Chest pain, atypical     12/2003:  negative cardiolyte  . HTN (hypertension)   . HLD (hyperlipidemia)   .  Depression   . Hx of tear of ACL (anterior cruciate ligament)     right  . History of meniscal tear     bilateral  . Erectile dysfunction   . Insomnia   . Eczema   . Olecranon bursitis of left elbow 10/2009    s/p I&D by Dr Maxie Better, initially assessed by Dr. Nori Riis   . Allergy   . Anxiety   . Cataract   . Prediabetes   . TIA (transient ischemic attack)     PAST SURGICAL HISTORY: Past Surgical History  Procedure Laterality Date  . Skin tag removal      11 removed  . Knee arthroscopy    . Incise and drain abcess      L elbow due to cellulitis/bursitis  . Inner ear surgery    . Radiology with anesthesia N/A 09/20/2015    Procedure: EMBOLIZATION         (RADIOLOGY WITH ANESTHESIA);  Surgeon: Luanne Bras, MD;  Location: [facility];  Service: Radiology;  Laterality: N/A;    FAMILY HISTORY: Family History  Problem Relation Age of Onset  . Colon cancer Neg Hx   . Rectal cancer Neg Hx   . Stomach cancer Neg Hx   . Stroke Mother   . Hypertension Mother   . Aneurysm Mother 32    Died of brain aneursym  . Heart failure Father   . Emphysema Father   . Diabetes Mellitus II Sister     SOCIAL HISTORY:  Social History   Social History  . Marital Status: Divorced    Spouse Name: N/A  . Number of Children: N/A  . Years of Education: N/A   Occupational History  . Not on file.   Social History Main Topics  . Smoking status: Never Smoker   . Smokeless tobacco: Never Used  . Alcohol Use: No  . Drug Use: No  . Sexual Activity: Not on file   Other Topics Concern  . 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   General: The patient is  well-developed and  well-nourished and in no acute distress  Neck: The neck is supple .  The neck is nontender.  Musculoskeletal:  Back is nontender  Neurologic Exam  Mental status: The patient is alert and oriented x 3 at the time of the examination. The patient has apparent normal recent and remote memory, with an apparently normal attention span and concentration ability.   Speech is normal.  Cranial nerves: Extraocular movements are full.   There is good facial sensation to soft touch bilaterally.Facial strength is normal.  Trapezius and sternocleidomastoid strength is normal. No dysarthria is noted.  The tongue is midline, and the patient has symmetric elevation of the soft palate. He has a left hearing loss but can feel the tuning fork on the mastoid well.  Motor:  Muscle bulk is normal.   Tone is normal. Strength is  5 / 5 in all 4 extremities except 4+/5 bilateral APB muscles  Sensory: Sensory testing is mildly altered to touch the [employer] of the right hand. Sensation was symmetric elsewhere in the arms and legs to touch and vibration..  Coordination: Cerebellar testing reveals good finger-nose-finger  bilaterally.  Gait and station: Station is normal.   Gait is normal. Tandem gait is wide. Romberg is negative.   Reflexes: Deep tendon reflexes are symmetric and normal bilaterally.     Other:   He has Tinel's signs and Phalen's signs at the wrist bilaterally.    DIAGNOSTIC DATA (LABS, IMAGING, TESTING) - I reviewed patient records, labs, notes, testing and imaging myself where available.  Lab Results  Component Value Date   WBC 10.0 09/24/2015   HGB 13.5 09/24/2015   HCT 40.7 09/24/2015   MCV 86.4 09/24/2015   PLT 317 09/24/2015      Component Value Date/Time   NA 139 09/24/2015 0705   K 3.5 09/24/2015 0705   CL 101 09/24/2015 0705   CO2 25 09/24/2015 0705   GLUCOSE 83 09/24/2015 0705   BUN 15 09/24/2015 0705   CREATININE 1.02 09/24/2015 0705   CREATININE 0.83 08/24/2010 1141     CALCIUM 8.9 09/24/2015 0705   PROT 6.6 09/24/2015 0705   ALBUMIN 3.3* 09/24/2015 0705   AST 21 09/24/2015 0705   ALT 36 09/24/2015 0705   ALKPHOS 66 09/24/2015 0705   BILITOT 0.7 09/24/2015 0705   GFRNONAA >60 09/24/2015 0705   GFRAA >60 09/24/2015 0705   Lab Results  Component Value Date   CHOL 158 09/22/2015   HDL 34* 09/22/2015   LDLCALC 95 09/22/2015   TRIG 145 09/22/2015   CHOLHDL 4.6 09/22/2015   Lab Results  Component Value Date   HGBA1C 6.1* 09/22/2015   No results found for: [id number] Lab Results  Component Value Date   TSH 0.589 03/01/2007       ASSESSMENT AND PLAN  Aneurysm, cerebral  Migraine without aura and without status migrainosus, not intractable  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      Bilateral carpal tunnel injection with a total of 2 mg Decadron in lidocaine. He tolerated the procedure well and there were no complications.       2.     obtain and wear carpal tunnel wrist splits for sleep.       3.     NCV/EMG.  4.     increase Lexapro for anxiety. Fioricet when necessary for headaches.  5.     he will return to see me for the NCV/EMG and I will follow up at that time.    He should call us sooner if he has new or worsening neurologic symptoms.                                                                                                                                                                                                                                                                                                                                                Jahel Wavra A. Felecia Shelling, MD, PhD [id number], [id number] AM Certified in Neurology, Clinical Neurophysiology, Sleep Medicine, Pain Medicine and Neuroimaging  [facility] [address] [place], [place] [address] [phone]

## 2015-12-22 ENCOUNTER — Telehealth: Payer: Self-pay | Admitting: Neurology

## 2015-12-22 NOTE — Telephone Encounter (Signed)
Patient is calling. He has fallen 2 times in the last 2 days. He says his knee gives out. Please call to discuss.

## 2015-12-22 NOTE — Telephone Encounter (Signed)
PC with pt. this am.  He c/o left knee buckling twice, causing falls.   Hx. of torn ACL, arthroscopy.  Advised pt. to f/u with ortho.  He verbalized understanding of same/fim

## 2015-12-24 ENCOUNTER — Inpatient Hospital Stay (HOSPITAL_COMMUNITY)
Admission: EM | Admit: 2015-12-24 | Discharge: 2015-12-31 | DRG: 041 | Disposition: A | Discharge status: Home / Self Care | Payer: Managed Care, Other (non HMO) | Attending: Internal Medicine | Admitting: Internal Medicine

## 2015-12-24 ENCOUNTER — Emergency Department (HOSPITAL_COMMUNITY): Payer: Managed Care, Other (non HMO)

## 2015-12-24 ENCOUNTER — Encounter: Payer: Managed Care, Other (non HMO) | Attending: Physical Medicine & Rehabilitation

## 2015-12-24 ENCOUNTER — Encounter: Payer: Self-pay | Admitting: Physical Medicine & Rehabilitation

## 2015-12-24 ENCOUNTER — Encounter (HOSPITAL_COMMUNITY): Payer: Self-pay | Admitting: Nurse Practitioner

## 2015-12-24 ENCOUNTER — Observation Stay (HOSPITAL_COMMUNITY): Payer: Managed Care, Other (non HMO)

## 2015-12-24 ENCOUNTER — Ambulatory Visit (HOSPITAL_BASED_OUTPATIENT_CLINIC_OR_DEPARTMENT_OTHER): Payer: Managed Care, Other (non HMO) | Admitting: Physical Medicine & Rehabilitation

## 2015-12-24 VITALS — BP 135/83 | HR 61 | Resp 17

## 2015-12-24 DIAGNOSIS — F32A Depression, unspecified: Secondary | ICD-10-CM | POA: Diagnosis present

## 2015-12-24 DIAGNOSIS — I6350 Cerebral infarction due to unspecified occlusion or stenosis of unspecified cerebral artery: Secondary | ICD-10-CM

## 2015-12-24 DIAGNOSIS — Z6838 Body mass index (BMI) 38.0-38.9, adult: Secondary | ICD-10-CM

## 2015-12-24 DIAGNOSIS — N529 Male erectile dysfunction, unspecified: Secondary | ICD-10-CM | POA: Diagnosis not present

## 2015-12-24 DIAGNOSIS — Z7902 Long term (current) use of antithrombotics/antiplatelets: Secondary | ICD-10-CM

## 2015-12-24 DIAGNOSIS — G8191 Hemiplegia, unspecified affecting right dominant side: Secondary | ICD-10-CM | POA: Diagnosis present

## 2015-12-24 DIAGNOSIS — Z79899 Other long term (current) drug therapy: Secondary | ICD-10-CM

## 2015-12-24 DIAGNOSIS — R269 Unspecified abnormalities of gait and mobility: Secondary | ICD-10-CM | POA: Insufficient documentation

## 2015-12-24 DIAGNOSIS — I69398 Other sequelae of cerebral infarction: Secondary | ICD-10-CM | POA: Diagnosis present

## 2015-12-24 DIAGNOSIS — G459 Transient cerebral ischemic attack, unspecified: Secondary | ICD-10-CM | POA: Diagnosis not present

## 2015-12-24 DIAGNOSIS — F419 Anxiety disorder, unspecified: Secondary | ICD-10-CM | POA: Diagnosis present

## 2015-12-24 DIAGNOSIS — I10 Essential (primary) hypertension: Secondary | ICD-10-CM

## 2015-12-24 DIAGNOSIS — I63411 Cerebral infarction due to embolism of right middle cerebral artery: Secondary | ICD-10-CM | POA: Diagnosis not present

## 2015-12-24 DIAGNOSIS — H9319 Tinnitus, unspecified ear: Secondary | ICD-10-CM | POA: Diagnosis present

## 2015-12-24 DIAGNOSIS — H9192 Unspecified hearing loss, left ear: Secondary | ICD-10-CM | POA: Diagnosis not present

## 2015-12-24 DIAGNOSIS — Z8673 Personal history of transient ischemic attack (TIA), and cerebral infarction without residual deficits: Secondary | ICD-10-CM

## 2015-12-24 DIAGNOSIS — R200 Anesthesia of skin: Secondary | ICD-10-CM

## 2015-12-24 DIAGNOSIS — Z7982 Long term (current) use of aspirin: Secondary | ICD-10-CM

## 2015-12-24 DIAGNOSIS — I63421 Cerebral infarction due to embolism of right anterior cerebral artery: Principal | ICD-10-CM | POA: Diagnosis present

## 2015-12-24 DIAGNOSIS — E785 Hyperlipidemia, unspecified: Secondary | ICD-10-CM

## 2015-12-24 DIAGNOSIS — G4700 Insomnia, unspecified: Secondary | ICD-10-CM | POA: Diagnosis present

## 2015-12-24 DIAGNOSIS — I639 Cerebral infarction, unspecified: Secondary | ICD-10-CM

## 2015-12-24 DIAGNOSIS — E119 Type 2 diabetes mellitus without complications: Secondary | ICD-10-CM

## 2015-12-24 DIAGNOSIS — G43109 Migraine with aura, not intractable, without status migrainosus: Secondary | ICD-10-CM | POA: Diagnosis not present

## 2015-12-24 DIAGNOSIS — R7303 Prediabetes: Secondary | ICD-10-CM | POA: Diagnosis not present

## 2015-12-24 DIAGNOSIS — T502X5A Adverse effect of carbonic-anhydrase inhibitors, benzothiadiazides and other diuretics, initial encounter: Secondary | ICD-10-CM | POA: Diagnosis present

## 2015-12-24 DIAGNOSIS — F329 Major depressive disorder, single episode, unspecified: Secondary | ICD-10-CM | POA: Diagnosis present

## 2015-12-24 DIAGNOSIS — I119 Hypertensive heart disease without heart failure: Secondary | ICD-10-CM | POA: Diagnosis present

## 2015-12-24 DIAGNOSIS — E876 Hypokalemia: Secondary | ICD-10-CM | POA: Diagnosis present

## 2015-12-24 DIAGNOSIS — Z8249 Family history of ischemic heart disease and other diseases of the circulatory system: Secondary | ICD-10-CM

## 2015-12-24 DIAGNOSIS — H9190 Unspecified hearing loss, unspecified ear: Secondary | ICD-10-CM

## 2015-12-24 DIAGNOSIS — E1165 Type 2 diabetes mellitus with hyperglycemia: Secondary | ICD-10-CM | POA: Diagnosis present

## 2015-12-24 DIAGNOSIS — D72829 Elevated white blood cell count, unspecified: Secondary | ICD-10-CM | POA: Diagnosis present

## 2015-12-24 DIAGNOSIS — R109 Unspecified abdominal pain: Secondary | ICD-10-CM

## 2015-12-24 DIAGNOSIS — Z823 Family history of stroke: Secondary | ICD-10-CM

## 2015-12-24 DIAGNOSIS — E669 Obesity, unspecified: Secondary | ICD-10-CM | POA: Diagnosis present

## 2015-12-24 DIAGNOSIS — H9193 Unspecified hearing loss, bilateral: Secondary | ICD-10-CM | POA: Diagnosis present

## 2015-12-24 DIAGNOSIS — S83512A Sprain of anterior cruciate ligament of left knee, initial encounter: Secondary | ICD-10-CM | POA: Diagnosis not present

## 2015-12-24 DIAGNOSIS — K439 Ventral hernia without obstruction or gangrene: Secondary | ICD-10-CM | POA: Diagnosis present

## 2015-12-24 HISTORY — DX: Nontraumatic intracerebral hemorrhage, unspecified: I61.9

## 2015-12-24 HISTORY — DX: Cerebral infarction, unspecified: I63.9

## 2015-12-24 LAB — DIFFERENTIAL
BASOS ABS: 0.1 10*3/uL (ref 0.0–0.1)
BASOS PCT: 0 %
Eosinophils Absolute: 0.7 10*3/uL (ref 0.0–0.7)
Eosinophils Relative: 6 %
LYMPHS ABS: 2.4 10*3/uL (ref 0.7–4.0)
Lymphocytes Relative: 21 %
MONOS PCT: 7 %
Monocytes Absolute: 0.8 10*3/uL (ref 0.1–1.0)
NEUTROS ABS: 7.8 10*3/uL — AB (ref 1.7–7.7)
Neutrophils Relative %: 66 %

## 2015-12-24 LAB — GLUCOSE, CAPILLARY: Glucose-Capillary: 103 mg/dL — ABNORMAL HIGH (ref 65–99)

## 2015-12-24 LAB — I-STAT TROPONIN, ED: TROPONIN I, POC: 0 ng/mL (ref 0.00–0.08)

## 2015-12-24 LAB — COMPREHENSIVE METABOLIC PANEL
ALT: 27 U/L (ref 17–63)
AST: 20 U/L (ref 15–41)
Albumin: 3.6 g/dL (ref 3.5–5.0)
Alkaline Phosphatase: 78 U/L (ref 38–126)
Anion gap: 8 (ref 5–15)
BUN: 20 mg/dL (ref 6–20)
CHLORIDE: 104 mmol/L (ref 101–111)
CO2: 26 mmol/L (ref 22–32)
CREATININE: 1.14 mg/dL (ref 0.61–1.24)
Calcium: 9.1 mg/dL (ref 8.9–10.3)
GFR calc Af Amer: >60 mL/min (ref >60)
GLUCOSE: 142 mg/dL — AB (ref 65–99)
Potassium: 3.3 mmol/L — ABNORMAL LOW (ref 3.5–5.1)
Sodium: 138 mmol/L (ref 135–145)
Total Bilirubin: 0.7 mg/dL (ref 0.3–1.2)
Total Protein: 6.7 g/dL (ref 6.5–8.1)

## 2015-12-24 LAB — CBC
HEMATOCRIT: 42.2 % (ref 39.0–52.0)
HEMOGLOBIN: 14.4 g/dL (ref 13.0–17.0)
MCH: 30.1 pg (ref 26.0–34.0)
MCHC: 34.1 g/dL (ref 30.0–36.0)
MCV: 88.1 fL (ref 78.0–100.0)
Platelets: 310 10*3/uL (ref 150–400)
RBC: 4.79 MIL/uL (ref 4.22–5.81)
RDW: 13.4 % (ref 11.5–15.5)
WBC: 11.7 10*3/uL — AB (ref 4.0–10.5)

## 2015-12-24 MED ORDER — LORAZEPAM 2 MG/ML IJ SOLN
1.0000 mg | INTRAMUSCULAR | Status: DC | PRN
  Administered 2015-12-24: 1 mg via INTRAVENOUS
  Filled 2015-12-24: qty 1

## 2015-12-24 MED ORDER — INSULIN ASPART 100 UNIT/ML ~~LOC~~ SOLN
0.0000 [IU] | Freq: Three times a day (TID) | SUBCUTANEOUS | Status: DC
  Administered 2015-12-25 (×2): 1 [IU] via SUBCUTANEOUS
  Administered 2015-12-26 – 2015-12-27 (×2): 2 [IU] via SUBCUTANEOUS
  Administered 2015-12-28: 1 [IU] via SUBCUTANEOUS

## 2015-12-24 MED ORDER — AMLODIPINE BESYLATE 10 MG PO TABS
10.0000 mg | ORAL_TABLET | Freq: Every day | ORAL | Status: DC
  Administered 2015-12-25 – 2015-12-27 (×3): 10 mg via ORAL
  Filled 2015-12-24 (×3): qty 1

## 2015-12-24 MED ORDER — ESCITALOPRAM OXALATE 10 MG PO TABS
20.0000 mg | ORAL_TABLET | Freq: Every day | ORAL | Status: DC
  Administered 2015-12-25 – 2015-12-31 (×7): 20 mg via ORAL
  Filled 2015-12-24 (×7): qty 2

## 2015-12-24 MED ORDER — LISINOPRIL 20 MG PO TABS
40.0000 mg | ORAL_TABLET | Freq: Every day | ORAL | Status: DC
  Administered 2015-12-25 – 2015-12-29 (×5): 40 mg via ORAL
  Filled 2015-12-24 (×5): qty 2

## 2015-12-24 MED ORDER — CHLORTHALIDONE 25 MG PO TABS
25.0000 mg | ORAL_TABLET | Freq: Every day | ORAL | Status: DC
  Administered 2015-12-25: 25 mg via ORAL
  Filled 2015-12-24: qty 1

## 2015-12-24 MED ORDER — CLOPIDOGREL BISULFATE 75 MG PO TABS
75.0000 mg | ORAL_TABLET | Freq: Every day | ORAL | Status: DC
  Administered 2015-12-25 – 2015-12-31 (×7): 75 mg via ORAL
  Filled 2015-12-24 (×7): qty 1

## 2015-12-24 MED ORDER — ASPIRIN EC 325 MG PO TBEC
325.0000 mg | DELAYED_RELEASE_TABLET | Freq: Every day | ORAL | Status: DC
  Administered 2015-12-25 – 2015-12-31 (×7): 325 mg via ORAL
  Filled 2015-12-24 (×7): qty 1

## 2015-12-24 MED ORDER — SENNOSIDES-DOCUSATE SODIUM 8.6-50 MG PO TABS: 1.0000 | ORAL_TABLET | Freq: Every evening | ORAL | Status: DC | PRN

## 2015-12-24 MED ORDER — METOPROLOL TARTRATE 50 MG PO TABS
50.0000 mg | ORAL_TABLET | Freq: Two times a day (BID) | ORAL | Status: DC
  Administered 2015-12-25 – 2015-12-27 (×5): 50 mg via ORAL
  Filled 2015-12-24 (×5): qty 1

## 2015-12-24 MED ORDER — ATORVASTATIN CALCIUM 10 MG PO TABS
10.0000 mg | ORAL_TABLET | Freq: Every day | ORAL | Status: DC
  Administered 2015-12-25 – 2015-12-27 (×3): 10 mg via ORAL
  Filled 2015-12-24 (×3): qty 1

## 2015-12-24 NOTE — Progress Notes (Signed)
Patient transported to MRI 

## 2015-12-24 NOTE — Progress Notes (Signed)
Patient is back from MRI  

## 2015-12-24 NOTE — ED Provider Notes (Signed)
[facility] DEPT Provider Note   CSN: [id number] Arrival date & time: 12/24/15  1332  First Provider Contact:  First MD Initiated Contact with Patient 12/24/15 1338        History   Chief Complaint Chief Complaint  Patient presents with  . Numbness    HPI KHMARI VANDERFORD is a 60 y.o. male.  The history is provided by the patient. No language interpreter was used.   JANUEL NUON is a 60 y.o. male who presents to the Emergency Department complaining of numbness.  Mr. Stefanovich presents for evaluation of right facial and arm numbness and weakness. Symptoms started just prior to ED arrival after leaving his doctor's office. He had weakness in the right arm that lasts about 15 minutes. His numbness resolved on arrival of EMS. He has a history of prior stroke in April of this year. He denies any fevers, chest pain, bowel pain. He reports 3 days of nausea, feeling poorly, slight cough and shortness of breath.  Past Medical History:  Diagnosis Date  . Allergy   . Anxiety   . Cataract   . Cerebral hemorrhage ([place])   . Chest pain, atypical    12/2003:  negative cardiolyte  . Depression   . Eczema   . Erectile dysfunction   . History of meniscal tear    bilateral  . HLD (hyperlipidemia)   . HTN (hypertension)   . Hx of tear of ACL (anterior cruciate ligament)    right  . Insomnia   . Olecranon bursitis of left elbow 10/2009   s/p I&D by Dr Maxie Better, initially assessed by Dr. Nori Riis   . Prediabetes   . Stroke ([place])   . TIA (transient ischemic attack)     Patient Active Problem List   Diagnosis Date Noted  . Carpal tunnel syndrome, bilateral 12/16/2015  . Status post stroke 11/12/2015  . Transient neurologic deficit 10/15/2015  . Cerebrovascular accident, late effects 10/07/2015  . Complicated migraine   . Anxiety state   . 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 Cerebral infarction due to embolism of right middle cerebral artery ([place]) 09/23/2015  . Cerebral infarction due to  embolism of cerebral artery ([place]) 09/23/2015  . Gait disturbance, post-stroke   . Drooping of mouth   . Stroke ([place])   . Benign essential HTN   . Migraine with aura and without status migrainosus, not intractable   . Tachypnea   . Prediabetes   . Acute blood loss anemia   . Brain aneurysm 09/20/2015  . Type 2 diabetes mellitus ([place]) 09/15/2015  . Leukocytosis 09/15/2015  . Aneurysm, cerebral 09/15/2015  . TIA (transient ischemic attack) 09/14/2015  . Temporary cerebral vascular dysfunction 09/14/2015  . H/O transient cerebral ischemia 07/29/2015  . At risk for falling 07/29/2015  . Bilateral hearing loss 10/25/2014  . Abnormal fear 08/08/2012  . Headache, migraine 08/08/2012  . Arthritis, degenerative 08/08/2012  . History of tear of ACL (anterior cruciate ligament) 03/08/2011  . History of knee problem 03/08/2011  . Depression 09/07/2010  . Depressive disorder, not elsewhere classified 09/07/2010  . URI (upper respiratory infection) 08/24/2010  . Hearing loss 09/06/2006  . Hyperlipidemia 04/23/2006  . Essential hypertension 04/23/2006  . INSOMNIA 04/23/2006    Past Surgical History:  Procedure Laterality Date  . INCISE AND DRAIN ABCESS     L elbow due to cellulitis/bursitis  . INNER EAR SURGERY    . KNEE ARTHROSCOPY    . RADIOLOGY WITH ANESTHESIA N/A 09/20/2015  Procedure: EMBOLIZATION         (RADIOLOGY WITH ANESTHESIA);  Surgeon: Luanne Bras, MD;  Location: [facility];  Service: Radiology;  Laterality: N/A;  . SKIN TAG REMOVAL     11 removed       Home Medications    Prior to Admission medications   Medication Sig Start Date End Date Taking? Authorizing Provider  amLODipine (NORVASC) 10 MG tablet Take 1 tablet (10 mg total) by mouth daily. 09/30/15   Lavon Paganini Angiulli, PA-C  aspirin EC 325 MG EC tablet Take 1 tablet (325 mg total) by mouth daily. 09/17/15   Shanker Kristeen Mans, MD  atorvastatin (LIPITOR) 10 MG tablet Take 1 tablet (10 mg total) by mouth daily at 6  PM. 09/30/15 09/29/16  Lavon Paganini Angiulli, PA-C  butalbital-acetaminophen-caffeine (FIORICET, ESGIC) 50-325-40 MG tablet 1-2 tablets as needed for headache.   No more than 8 per week 12/16/15   Britt Bottom, MD  chlorthalidone (HYGROTON) 25 MG tablet Take 1 tablet (25 mg total) by mouth daily. 09/30/15   Lavon Paganini Angiulli, PA-C  clopidogrel (PLAVIX) 75 MG tablet Take 1 tablet (75 mg total) by mouth daily. 09/30/15   Lavon Paganini Angiulli, PA-C  Elastic Bandages & Supports (WRIST SPLINT LEFT/RIGHT) MISC Bilateral cock-up wrist splints for carpal tunnel 12/16/15   Britt Bottom, MD  escitalopram (LEXAPRO) 20 MG tablet Take 1 tablet (20 mg total) by mouth daily. 12/16/15   Britt Bottom, MD  fluticasone (FLONASE) 50 MCG/ACT nasal spray Place 2 sprays into the nose daily. Patient taking differently: Place 2 sprays into the nose daily as needed for allergies.  09/07/12   Cleatrice Burke, PA-C  lisinopril (PRINIVIL,ZESTRIL) 40 MG tablet Take 40 mg by mouth daily.    Historical Provider, MD  metoprolol (LOPRESSOR) 50 MG tablet Take 1 tablet (50 mg total) by mouth 2 (two) times daily. 09/30/15   Lavon Paganini Angiulli, PA-C    Family History Family History  Problem Relation Age of Onset  . Stroke Mother   . Hypertension Mother   . Aneurysm Mother 43    Died of brain aneursym  . Heart failure Father   . Emphysema Father   . Diabetes Mellitus II Sister   . Colon cancer Neg Hx   . Rectal cancer Neg Hx   . Stomach cancer Neg Hx     Social History Social History  Substance Use Topics  . Smoking status: Never Smoker  . Smokeless tobacco: Never Used  . Alcohol use No     Allergies   Review of patient's allergies indicates no known allergies.   Review of Systems Review of Systems  All other systems reviewed and are negative.    Physical Exam Updated Vital Signs BP 123/75 (BP Location: Right Arm)   Pulse (!) 59   Temp 98.5 F (36.9 C)   Resp 21   Ht 5\' 8"  (1.727 m)   Wt 248 lb (112.5 kg)   SpO2  95%   BMI 37.71 kg/m   Physical Exam  Constitutional: He is oriented to person, place, and time. He appears well-developed and well-nourished.  HENT:  Head: Normocephalic and atraumatic.  Cardiovascular: Normal rate and regular rhythm.   No murmur heard. Pulmonary/Chest: Effort normal and breath sounds normal. No respiratory distress.  Abdominal: Soft. There is no tenderness. There is no rebound and no guarding.  Musculoskeletal: He exhibits no edema or tenderness.  Neurological: He is alert and oriented to person, place, and time.  No cranial nerve deficit. Coordination normal.  5 out of 5 strength in all 4 extremities, sensation to light touch intact in all four extremities.   Skin: Skin is warm and dry.  Psychiatric: He has a normal mood and affect. His behavior is normal.  Nursing note and vitals reviewed.    ED Treatments / Results  Labs (all labs ordered are listed, but only abnormal results are displayed) Labs Reviewed  CBC - Abnormal; Notable for the following:       Result Value   WBC 11.7 (*)    All other components within normal limits  DIFFERENTIAL - Abnormal; Notable for the following:    Neutro Abs 7.8 (*)    All other components within normal limits  COMPREHENSIVE METABOLIC PANEL - Abnormal; Notable for the following:    Potassium 3.3 (*)    Glucose, Bld 142 (*)    All other components within normal limits  CULTURE, BLOOD (ROUTINE X 2)  CULTURE, BLOOD (ROUTINE X 2)  URINE CULTURE  URINE RAPID DRUG SCREEN, HOSP PERFORMED  URINALYSIS, ROUTINE W REFLEX MICROSCOPIC (NOT AT [facility])  I-STAT TROPOININ, ED    EKG  EKG Interpretation  Date/Time:  Friday December 24 2015 13:34:35 EDT Ventricular Rate:  66 PR Interval:    QRS Duration: 101 QT Interval:  522 QTC Calculation: 547 R Axis:   69 Text Interpretation:  Sinus rhythm Consider left atrial enlargement Prolonged QT interval Confirmed by Hazle Coca [phone]) on 12/24/2015 2:03:42 PM       Radiology Dg Chest 2  View  Result Date: 12/24/2015 CLINICAL DATA:  Right facial numbness. EXAM: CHEST  2 VIEW COMPARISON:  Radiographs of September 15, 2015. FINDINGS: The heart size and mediastinal contours are within normal limits. Both lungs are clear. No pneumothorax or pleural effusion is noted. The visualized skeletal structures are unremarkable. IMPRESSION: No active cardiopulmonary disease. Electronically Signed   By: [name], M.D.   On: 12/24/2015 15:22  Ct Head Wo Contrast  Result Date: 12/24/2015 CLINICAL DATA:  Facial numbness beginning at 12 noon today. EXAM: CT HEAD WITHOUT CONTRAST TECHNIQUE: Contiguous axial images were obtained from the base of the skull through the vertex without intravenous contrast. COMPARISON:  Brain MRI 09/21/2015.  Head CT scan 09/20/2015. 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 There is some chronic microvascular ischemic change. No evidence of acute intracranial abnormality including hemorrhage, infarct, mass lesion, mass effect, midline shift or abnormal extra-axial fluid collection is seen. Mucous retention cysts or polyps in the maxillary sinuses are identified. Mild ethmoid air cell disease is noted. The calvarium is intact. IMPRESSION: No acute abnormality. Chronic microvascular ischemic change. 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 Electronically Signed   By: Inge Rise M.D.   On: 12/24/2015 14:33   Procedures Procedures (including critical care time)  Medications Ordered in ED Medications  escitalopram (LEXAPRO) tablet 20 mg (not administered)  lisinopril (PRINIVIL,ZESTRIL) tablet 40 mg (not administered)  amLODipine (NORVASC) tablet 10 mg (not administered)  atorvastatin (LIPITOR) tablet 10 mg (not administered)  chlorthalidone (HYGROTON) tablet 25 mg (not administered)  clopidogrel (PLAVIX) tablet 75 mg (not administered)  metoprolol tartrate (LOPRESSOR) tablet 50 mg (not  administered)  aspirin EC tablet 325 mg (not administered)  senna-docusate (Senokot-S) tablet 1 tablet (not administered)  insulin aspart (novoLOG) injection 0-9 Units (not administered)     Initial Impression / Assessment and Plan / ED Course  I  have reviewed the triage vital signs and the nursing notes.  Pertinent labs & imaging results that were available during my care of the patient were reviewed by me and considered in my medical decision making (see chart for details).  Clinical Course    Patient with history of recent stroke here with transient numbness and weakness to the right face and arm. His symptoms have resolved on ED arrival. Discussed with neurology - recommend admission for further workup. Hospitalist consulted for admission. Patient is in agreement with plan.  Final Clinical Impressions(s) / ED Diagnoses   Final diagnoses:  Numbness    New Prescriptions New Prescriptions   No medications on file     Quintella Reichert, MD 12/24/15 1727

## 2015-12-24 NOTE — ED Notes (Signed)
David Neuro PA at bedside.

## 2015-12-24 NOTE — Progress Notes (Signed)
Pt arrived from ED alert, verbal with no noted distress. Pt stable, neuro intact. Pt reported that he was experiencing numbness and tingling to scalp, face, gums, right arm and hand  which as resolved at this time. Pt denies pain or discomfort. Pt oriented to room. Safety measures in place. Call bell within reach. Will continue to monitor.

## 2015-12-24 NOTE — H&P (Signed)
History and Physical    [name] [id number] DOB: December 01, 1955 DOA: 12/24/2015   PCP: Berkley Harvey, NP   Patient coming from:  Home  Chief Complaint: right sided numbness    HPI: [name] is a 60 y.o. male with medical history significant for hypertension, hyperlipidemia, anxiety, tinnitus,  prior history of cerebral hemorrhage with stent on 4/20 , history of CVA, presented for EMS with right ear numbness and tingling, then spreading throughout the right side of his face, and then down to the right arm. Denies any slurred speech, left-sided weakness or numbness. Symptoms are now resolved. He denies any falls. No acute mental status changes. The patient is on blood thinners from previous strokes.Denies headaches. No dysarthria. No dysphagia. No confusion. No seizures. Denies any chest pain, or shortness of breath. Denies any fever or chills, or night sweats.Does not smoke. No new meds or hormonal supplements. Does take a regular ASA a day Patient is compliant with his medications. Denies any recent long distance trips. No recent surgeries. No sick contacts. No new stressors present in personal life.Patient is very active, exercising daily. He is not a diabetic. Neuro consult in progress.    ED Course:  BP 100/67   Pulse 60   Temp 98.5 F (36.9 C)   Resp 22   Ht 5\' 8"  (1.727 m)   Wt 112.5 kg (248 lb)   SpO2 93%   BMI 37.71 kg/m    white count 11.7 potassium 3.3 glucose 142 Troponin is 0 EEG to evaluate for possible seizure., and MRI of the brain not to be performed as per neuro note, as the patient had a recent stroke workup, and no further testing is indicated at this time. Dr. Tasia Catchings to see. Review of Systems: As per HPI otherwise 10 point review of systems negative.   Past Medical History:  Diagnosis Date  . Allergy   . Anxiety   . Cataract   . Cerebral hemorrhage ([place])   . Chest pain, atypical    12/2003:  negative cardiolyte  . Depression   . Eczema   .  Erectile dysfunction   . History of meniscal tear    bilateral  . HLD (hyperlipidemia)   . HTN (hypertension)   . Hx of tear of ACL (anterior cruciate ligament)    right  . Insomnia   . Olecranon bursitis of left elbow 10/2009   s/p I&D by Dr Maxie Better, initially assessed by Dr. Nori Riis   . Prediabetes   . Stroke ([place])   . TIA (transient ischemic attack)     Past Surgical History:  Procedure Laterality Date  . INCISE AND DRAIN ABCESS     L elbow due to cellulitis/bursitis  . INNER EAR SURGERY    . KNEE ARTHROSCOPY    . RADIOLOGY WITH ANESTHESIA N/A 09/20/2015   Procedure: EMBOLIZATION         (RADIOLOGY WITH ANESTHESIA);  Surgeon: Luanne Bras, MD;  Location: [facility];  Service: Radiology;  Laterality: N/A;  . SKIN TAG REMOVAL     11 removed    Social History Social History   Social History  . Marital status: Divorced    Spouse name: N/A  . Number of children: N/A  . Years of education: N/A   Occupational History  . Not on file.   Social History Main Topics  . Smoking status: Never Smoker  . Smokeless tobacco: Never Used  . Alcohol use No  . Drug use: No  .  Sexual activity: Not on file   Other Topics Concern  . Not on file   Social History Narrative   Separated, has 2 daughters, works as a Forensic psychologist, Therapist, occupational   Works at [employer], distribution center. 10-15-15     No Known Allergies  Family History  Problem Relation Age of Onset  . Stroke Mother   . Hypertension Mother   . Aneurysm Mother 60    Died of brain aneursym  . Heart failure Father   . Emphysema Father   . Diabetes Mellitus II Sister   . Colon cancer Neg Hx   . Rectal cancer Neg Hx   . Stomach cancer Neg Hx       Prior to Admission medications   Medication Sig Start Date End Date Taking? Authorizing Provider  amLODipine (NORVASC) 10 MG tablet Take 1 tablet (10 mg total) by mouth daily. 09/30/15   Lavon Paganini Angiulli, PA-C  aspirin EC 325 MG EC tablet Take 1 tablet (325 mg  total) by mouth daily. 09/17/15   Shanker Kristeen Mans, MD  atorvastatin (LIPITOR) 10 MG tablet Take 1 tablet (10 mg total) by mouth daily at 6 PM. 09/30/15 09/29/16  Lavon Paganini Angiulli, PA-C  butalbital-acetaminophen-caffeine (FIORICET, ESGIC) 50-325-40 MG tablet 1-2 tablets as needed for headache.   No more than 8 per week 12/16/15   Britt Bottom, MD  chlorthalidone (HYGROTON) 25 MG tablet Take 1 tablet (25 mg total) by mouth daily. 09/30/15   Lavon Paganini Angiulli, PA-C  clopidogrel (PLAVIX) 75 MG tablet Take 1 tablet (75 mg total) by mouth daily. 09/30/15   Lavon Paganini Angiulli, PA-C  Elastic Bandages & Supports (WRIST SPLINT LEFT/RIGHT) MISC Bilateral cock-up wrist splints for carpal tunnel 12/16/15   Britt Bottom, MD  escitalopram (LEXAPRO) 20 MG tablet Take 1 tablet (20 mg total) by mouth daily. 12/16/15   Britt Bottom, MD  fluticasone (FLONASE) 50 MCG/ACT nasal spray Place 2 sprays into the nose daily. Patient taking differently: Place 2 sprays into the nose daily as needed for allergies.  09/07/12   Cleatrice Burke, PA-C  lisinopril (PRINIVIL,ZESTRIL) 40 MG tablet Take 40 mg by mouth daily.    Historical Provider, MD  metoprolol (LOPRESSOR) 50 MG tablet Take 1 tablet (50 mg total) by mouth 2 (two) times daily. 09/30/15   Cathlyn Parsons, PA-C    Physical Exam:    Vitals:   12/24/15 1537 12/24/15 1545 12/24/15 1553 12/24/15 1600  BP: 114/57 112/62  100/67  Pulse: 65 65  60  Resp: 21 18  22   Temp:   98.5 F (36.9 C)   TempSrc:      SpO2: 95% 92%  93%  Weight:      Height:           Constitutional: NAD, mildly anxious Vitals:   12/24/15 1537 12/24/15 1545 12/24/15 1553 12/24/15 1600  BP: 114/57 112/62  100/67  Pulse: 65 65  60  Resp: 21 18  22   Temp:   98.5 F (36.9 C)   TempSrc:      SpO2: 95% 92%  93%  Weight:      Height:       Eyes: PERRL, lids and conjunctivae normal ENMT: Mucous membranes are moist. Posterior pharynx clear of any exudate or lesions.Normal dentition.  Neck:  normal, supple, no masses, no thyromegaly Respiratory: clear to auscultation bilaterally, no wheezing, no crackles. Normal respiratory effort. No accessory muscle use.  Cardiovascular: Regular rate and  rhythm, no murmurs / rubs / gallops. Trace RLE extremity edema. 2+ pedal pulses. No carotid bruits.  Abdomen: obese, no tenderness, no masses palpated. No hepatosplenomegaly. Bowel sounds positive.  Musculoskeletal: no clubbing / cyanosis. No joint deformity upper and lower extremities. Good ROM, no contractures. Normal muscle tone.  Skin: no rashes, lesions, ulcers.  Neurologic: CN 2-12 grossly intact. Sensation intact, DTR normal. Strength 5/5 in all 4.  Psychiatric: Normal judgment and insight. Alert and oriented x 3.anxious  mood.     Labs on Admission: I have personally reviewed following labs and imaging studies  CBC:  Recent Labs Lab 12/24/15 1348  WBC 11.7*  NEUTROABS 7.8*  HGB 14.4  HCT 42.2  MCV 88.1  PLT 99991111    Basic Metabolic Panel:  Recent Labs Lab 12/24/15 1348  NA 138  K 3.3*  CL 104  CO2 26  GLUCOSE 142*  BUN 20  CREATININE 1.14  CALCIUM 9.1    GFR: Estimated Creatinine Clearance: 82.8 mL/min (by C-G formula based on SCr of 1.14 mg/dL).  Liver Function Tests:  Recent Labs Lab 12/24/15 1348  AST 20  ALT 27  ALKPHOS 78  BILITOT 0.7  PROT 6.7  ALBUMIN 3.6   No results for input(s): LIPASE, AMYLASE in the last 168 hours. No results for input(s): AMMONIA in the last 168 hours.  Coagulation Profile: No results for input(s): INR, PROTIME in the last 168 hours.  Cardiac Enzymes: No results for input(s): CKTOTAL, CKMB, CKMBINDEX, TROPONINI in the last 168 hours.  BNP (last 3 results) No results for input(s): PROBNP in the last 8760 hours.  HbA1C: No results for input(s): HGBA1C in the last 72 hours.  CBG: No results for input(s): GLUCAP in the last 168 hours.  Lipid Profile: No results for input(s): CHOL, HDL, LDLCALC, TRIG, CHOLHDL,  LDLDIRECT in the last 72 hours.  Thyroid Function Tests: No results for input(s): TSH, T4TOTAL, FREET4, T3FREE, THYROIDAB in the last 72 hours.  Anemia Panel: No results for input(s): VITAMINB12, FOLATE, FERRITIN, TIBC, IRON, RETICCTPCT in the last 72 hours.  Urine analysis:    Component Value Date/Time   COLORURINE YELLOW 02/06/2008 [place] 02/06/2008 1047   LABSPEC 1.022 02/06/2008 1047   PHURINE 6.0 02/06/2008 1047   GLUCOSEU NEGATIVE 02/06/2008 1047   HGBUR SMALL (A) 02/06/2008 1047   HGBUR small 02/06/2008 0856   BILIRUBINUR NEGATIVE 02/06/2008 1047   KETONESUR NEGATIVE 02/06/2008 1047   PROTEINUR NEGATIVE 02/06/2008 1047   UROBILINOGEN 0.2 02/06/2008 1047   NITRITE NEGATIVE 02/06/2008 1047   LEUKOCYTESUR NEGATIVE 02/06/2008 1047    Sepsis Labs: @LABRCNTIP (procalcitonin:4,lacticidven:4) )No results found for this or any previous visit (from the past 240 hour(s)).   Radiological Exams on Admission: Dg Chest 2 View  Result Date: 12/24/2015 CLINICAL DATA:  Right facial numbness. EXAM: CHEST  2 VIEW COMPARISON:  Radiographs of September 15, 2015. FINDINGS: The heart size and mediastinal contours are within normal limits. Both lungs are clear. No pneumothorax or pleural effusion is noted. The visualized skeletal structures are unremarkable. IMPRESSION: No active cardiopulmonary disease. Electronically Signed   By: [name], M.D.   On: 12/24/2015 15:22  Ct Head Wo Contrast  Result Date: 12/24/2015 CLINICAL DATA:  Facial numbness beginning at 12 noon today. EXAM: CT HEAD WITHOUT CONTRAST TECHNIQUE: Contiguous axial images were obtained from the base of the skull through the vertex without intravenous contrast. COMPARISON:  Brain MRI 09/21/2015.  Head CT scan 09/20/2015. 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 There is some chronic microvascular ischemic change. No evidence of acute intracranial abnormality  including hemorrhage, infarct, mass lesion, mass effect, midline shift or abnormal extra-axial fluid collection is seen. Mucous retention cysts or polyps in the maxillary sinuses are identified. Mild ethmoid air cell disease is noted. The calvarium is intact. IMPRESSION: No acute abnormality. Chronic microvascular ischemic change. 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 Electronically Signed   By: Inge Rise M.D.   On: 12/24/2015 14:33   EKG: Independently reviewed.  Assessment/Plan Principal Problem:   TIA (transient ischemic attack) Active Problems:   Hyperlipidemia   Hearing loss   Essential hypertension   Depression   Type 2 diabetes mellitus (HCC)   Leukocytosis   Stroke (HCC)   Migraine with aura and without status migrainosus, not intractable   Status post stroke   Bilateral hearing loss     Stroke like symptoms in a patient with history of CVA, cerebral aneurysm with stent in 08/2015, now Concerning for TIA versus  Seizure. Neuro on board CT head unremarkable. Able to swallow without difficulty  Appreciate Neuro involvement, awaiting plan . Risk factor hypertension, HLD,  Recent CVA as above. LAst 2 D echo 08/2015, nl LVF, EF normal  Telemetry observation UDS  Tylenol for headaches   Hypertension BP 100/67   Pulse 60   Temp 98.5 F (36.9 C)   Resp 22   Ht 5\' 8"  (1.727 m)   Wt 112.5 kg (248 lb)   SpO2 93%   BMI 37.71 kg/m  Controlled. Allow permissive hypertension for now until Neuro recommendations  Continue home anti-hypertensive medications in am   Hyperlipidemia Continue home statins  Anxiety/ Depression Continue home meds including Lexapro    Leukocytosis, appears chronic 10-11 No apparent infection  WBC 11.7  CXR NAD Afebrile  Obtain cultures . Will hold antibiotics for now.   Repeat CBC in AM  Type II Diabetes Current blood sugar level is 142 Lab Results  Component Value Date   HGBA1C 6.1 (H) 09/22/2015  [facility]  healthy carb modified diet once passing bed  swallow screen by nursing.  DVT prophylaxis:   SCDs for now . Continue Plavix Code Status:   Full   Family Communication:  Discussed with patient Disposition Plan: Expect patient to be discharged to home after condition improves Consults called:    Neuro, Dr. Tasia Catchings Admission status:Tele  Obs    Sharene Butters E, PA-C [facility]   If 7PM-7AM, please contact night-coverage [email or web address] Password [facility]  12/24/2015, 4:52 PM

## 2015-12-24 NOTE — ED Notes (Signed)
NIH 0 Pt currently has no neurological symptoms Passed Swallow Screen Next neuro check and 1800 #[id number] [name]

## 2015-12-24 NOTE — Progress Notes (Signed)
Pt on telemetry monitoring

## 2015-12-24 NOTE — Progress Notes (Signed)
[facility] arrived to take pt to MRI but pt refused stating that he needed something for anxiety before test. He also requested a diet order. Md paged.

## 2015-12-24 NOTE — Progress Notes (Signed)
Pt refused scheduled medication stating that he has taken all of his daily medication this am because he works 3rd shift.

## 2015-12-24 NOTE — ED Notes (Signed)
Pt given urinal.  Will collect when pt is able to go.

## 2015-12-24 NOTE — Consult Note (Signed)
Requesting Physician: Dr. Ayesha Rumpf    Chief Complaint: right sided numbness which has resolved.   History obtained from:  Patient     HPI:                                                                                                                                         [name] is an 60 y.o. male with h/o migraines since he was young.   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 Today He was on the phone with his daughter when he noted his right ear became numb then his right face and gums--this progressed to his right arm with tunnel vision and wavy lines. After then numbness cleared his arm was heavy. This has fully cleared at this time. He had no HA prior or after. He does have a history of migraines but cannot recall what those symptoms were like. History is vague as he is hard of hearing and having a hard time understanding my questions and recalling specifics about the event.   Date last known well: Today Time last known well: Time: 12:30 tPA Given: No: symptoms resolved.     Past Medical History:  Diagnosis Date  . Allergy   . Anxiety   . Cataract   . Cerebral hemorrhage ([place])   . Chest pain, atypical    12/2003:  negative cardiolyte  . Depression   . Eczema   . Erectile dysfunction   . History of meniscal tear    bilateral  . HLD (hyperlipidemia)   . HTN (hypertension)   . Hx of tear of ACL (anterior cruciate ligament)    right  . Insomnia   . Olecranon bursitis of left elbow 10/2009   s/p I&D by Dr Maxie Better, initially assessed by Dr. Nori Riis   . Prediabetes   . Stroke ([place])   . TIA (transient ischemic attack)     Past Surgical History:  Procedure Laterality Date  . INCISE AND DRAIN ABCESS     L elbow due to cellulitis/bursitis  . INNER EAR SURGERY    . KNEE ARTHROSCOPY    . RADIOLOGY WITH ANESTHESIA N/A 09/20/2015   Procedure: EMBOLIZATION         (RADIOLOGY WITH  ANESTHESIA);  Surgeon: Luanne Bras, MD;  Location: [facility];  Service: Radiology;  Laterality: N/A;  . SKIN TAG REMOVAL     11 removed    Family History  Problem Relation Age of Onset  . Stroke Mother   . Hypertension Mother   . Aneurysm Mother 77    Died of brain aneursym  . Heart failure Father   . Emphysema Father   . Diabetes Mellitus II Sister   . Colon cancer Neg Hx   . Rectal cancer Neg Hx   . Stomach cancer Neg Hx  Social History:  reports that he has never smoked. He has never used smokeless tobacco. He reports that he does not drink alcohol or use drugs.  Allergies: No Known Allergies  Medications:                                                                                                                          No current facility-administered medications for this encounter.    Current Outpatient Prescriptions  Medication Sig Dispense Refill  . amLODipine (NORVASC) 10 MG tablet Take 1 tablet (10 mg total) by mouth daily. 30 tablet 0  . aspirin EC 325 MG EC tablet Take 1 tablet (325 mg total) by mouth daily. 30 tablet 0  . atorvastatin (LIPITOR) 10 MG tablet Take 1 tablet (10 mg total) by mouth daily at 6 PM. 30 tablet 1  . butalbital-acetaminophen-caffeine (FIORICET, ESGIC) 50-325-40 MG tablet 1-2 tablets as needed for headache.   No more than 8 per week 30 tablet 2  . chlorthalidone (HYGROTON) 25 MG tablet Take 1 tablet (25 mg total) by mouth daily. 30 tablet 1  . clopidogrel (PLAVIX) 75 MG tablet Take 1 tablet (75 mg total) by mouth daily. 30 tablet 0  . Elastic Bandages & Supports (WRIST SPLINT LEFT/RIGHT) MISC Bilateral cock-up wrist splints for carpal tunnel 2 each 0  . escitalopram (LEXAPRO) 20 MG tablet Take 1 tablet (20 mg total) by mouth daily. 30 tablet 5  . fluticasone (FLONASE) 50 MCG/ACT nasal spray Place 2 sprays into the nose daily. (Patient taking differently: Place 2 sprays into the nose daily as needed for allergies. ) 16 g 0  . lisinopril  (PRINIVIL,ZESTRIL) 40 MG tablet Take 40 mg by mouth daily.    . metoprolol (LOPRESSOR) 50 MG tablet Take 1 tablet (50 mg total) by mouth 2 (two) times daily. 60 tablet 1     ROS:                                                                                                                                       History obtained from the patient  General ROS: negative for - chills, fatigue, fever, night sweats, weight gain or weight loss Psychological ROS: negative for - behavioral disorder, hallucinations, memory difficulties, mood swings or suicidal ideation Ophthalmic ROS: negative for - blurry vision, double vision, eye pain or loss of vision ENT ROS: negative for -  epistaxis, nasal discharge, oral lesions, sore throat, tinnitus or vertigo Allergy and Immunology ROS: negative for - hives or itchy/watery eyes Hematological and Lymphatic ROS: negative for - bleeding problems, bruising or swollen lymph nodes Endocrine ROS: negative for - galactorrhea, hair pattern changes, polydipsia/polyuria or temperature intolerance Respiratory ROS: negative for - cough, hemoptysis, shortness of breath or wheezing Cardiovascular ROS: negative for - chest pain, dyspnea on exertion, edema or irregular heartbeat Gastrointestinal ROS: negative for - abdominal pain, diarrhea, hematemesis, nausea/vomiting or stool incontinence Genito-Urinary ROS: negative for - dysuria, hematuria, incontinence or urinary frequency/urgency Musculoskeletal ROS: negative for - joint swelling or muscular weakness Neurological ROS: as noted in HPI Dermatological ROS: negative for rash and skin lesion changes  Neurologic Examination:                                                                                                      Blood pressure 114/57, pulse 65, temperature 98.5 F (36.9 C), resp. rate 21, height 5\' 8"  (1.727 m), weight 112.5 kg (248 lb), SpO2 95 %.  HEENT-  Normocephalic, no lesions, without obvious  abnormality.  Normal external eye and conjunctiva.  Normal TM's bilaterally.  Normal auditory canals and external ears. Normal external nose, mucus membranes and septum.  Normal pharynx. Cardiovascular- S1, S2 normal, pulses palpable throughout   Lungs- chest clear, no wheezing, rales, normal symmetric air entry Abdomen- normal findings: bowel sounds normal Extremities- no edema Lymph-no adenopathy palpable Musculoskeletal-no joint tenderness, deformity or swelling Skin-warm and dry, no hyperpigmentation, vitiligo, or suspicious lesions  Neurological Examination Mental Status: Alert, oriented, thought content appropriate.  Speech fluent without evidence of aphasia.  Able to follow 3 step commands without difficulty. Cranial Nerves: II:  Visual fields grossly normal, pupils equal, round, reactive to light and accommodation III,IV, VI: ptosis not present, extra-ocular motions intact bilaterally V,VII: smile symmetric, facial light touch sensation normal bilaterally VIII: hearing significantly decreased on the left and also decreased on the right.  IX,X: uvula rises symmetrically XI: bilateral shoulder shrug XII: midline tongue extension Motor: Right : Upper extremity   5/5    Left:     Upper extremity   5/5  Lower extremity   5/5     Lower extremity   5/5 Tone and bulk:normal tone throughout; no atrophy noted Sensory: Pinprick and light touch intact throughout, bilaterally Deep Tendon Reflexes: 1+ and symmetric throughout Plantars: Right: downgoing   Left: downgoing Cerebellar: normal finger-to-nose, and normal heel-to-shin test Gait: not tested       Lab Results: Basic Metabolic Panel:  Recent Labs Lab 12/24/15 1348  NA 138  K 3.3*  CL 104  CO2 26  GLUCOSE 142*  BUN 20  CREATININE 1.14  CALCIUM 9.1    Liver Function Tests:  Recent Labs Lab 12/24/15 1348  AST 20  ALT 27  ALKPHOS 78  BILITOT 0.7  PROT 6.7  ALBUMIN 3.6   No results for input(s): LIPASE,  AMYLASE in the last 168 hours. No results for input(s): AMMONIA in the last 168 hours.  CBC:  Recent Labs Lab 12/24/15  1348  WBC 11.7*  NEUTROABS 7.8*  HGB 14.4  HCT 42.2  MCV 88.1  PLT 310    Cardiac Enzymes: No results for input(s): CKTOTAL, CKMB, CKMBINDEX, TROPONINI in the last 168 hours.  Lipid Panel: No results for input(s): CHOL, TRIG, HDL, CHOLHDL, VLDL, LDLCALC in the last 168 hours.  CBG: No results for input(s): GLUCAP in the last 168 hours.  Microbiology: Results for orders placed or performed during the hospital encounter of 09/14/15  Surgical pcr screen     Status: Abnormal   Collection Time: 09/15/15 11:34 PM  Result Value Ref Range Status   MRSA, PCR NEGATIVE NEGATIVE Final   Staphylococcus aureus POSITIVE (A) NEGATIVE Final    Comment:        The Xpert SA Assay (FDA approved for NASAL specimens in patients over 17 years of age), is one component of a comprehensive surveillance program.  Test performance has been validated by [facility] for patients greater than or equal to 14 year old. It is not intended to diagnose infection nor to guide or monitor treatment.     Coagulation Studies: No results for input(s): LABPROT, INR in the last 72 hours.  Imaging: Dg Chest 2 View  Result Date: 12/24/2015 CLINICAL DATA:  Right facial numbness. EXAM: CHEST  2 VIEW COMPARISON:  Radiographs of September 15, 2015. FINDINGS: The heart size and mediastinal contours are within normal limits. Both lungs are clear. No pneumothorax or pleural effusion is noted. The visualized skeletal structures are unremarkable. IMPRESSION: No active cardiopulmonary disease. Electronically Signed   By: [name], M.D.   On: 12/24/2015 15:22  Ct Head Wo Contrast  Result Date: 12/24/2015 CLINICAL DATA:  Facial numbness beginning at 12 noon today. EXAM: CT HEAD WITHOUT CONTRAST TECHNIQUE: Contiguous axial images were obtained from the base of the skull through the vertex without  intravenous contrast. COMPARISON:  Brain MRI 09/21/2015.  Head CT scan 09/20/2015. 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 There is some chronic microvascular ischemic change. No evidence of acute intracranial abnormality including hemorrhage, infarct, mass lesion, mass effect, midline shift or abnormal extra-axial fluid collection is seen. Mucous retention cysts or polyps in the maxillary sinuses are identified. Mild ethmoid air cell disease is noted. The calvarium is intact. IMPRESSION: No acute abnormality. Chronic microvascular ischemic change. 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 Electronically Signed   By: Inge Rise M.D.   On: 12/24/2015 14:33      Assessment and plan discussed with with attending physician and they are in agreement.    Etta Quill PA-C [facility] [phone]  12/24/2015, 4:05 PM   Assessment: 60 y.o. male with sudden onset of right ear/facial/and arm numbness and tingling followed by a sensation of right arm heaviness. The symptoms had a marching like pattern followed by visual scotoma. Given his history of his aneurysm and infarcts cannot rule out possible simple partial seizure versus complicated migraine versus CVA which is less likely.   Stroke Risk Factors - hyperlipidemia and hypertension  Recommend: 1) EEG 2) MRI brain (will need significant sedation as he tells me he is very claustrophobic) As he recently had a stroke work up and on both ASA and plavix do not see need to repeat full work up at this time.   Dr. Tasia Catchings to make further recommendations.    Neurology attending:  Hamidou is a pleasant 60 year old gentleman who is admitted to the hospital  for symptoms of right-sided facial numbness and numbness of the right upper extremity. [name] reports that yesterday he was speaking on the phone when he began to notice a sensation of numbness in his right ear the  subsequently progressed into his right upper extremity. He reported no other cranial nerve symptoms and he reported no focal weakness. [name] reports that the symptoms have presently cleared.  [name] reports that last week he had seen his outpatient neurologist and was reporting bilateral hand numbness. His neurologist performed trigger point injections in the wrist for possible carpal tunnel syndrome. [name] felt his symptoms improved with these trigger point injections. In addition to the symptoms involving the right side of the face and right upper extremity [name] reports he also had a general sensation of doing slightly disconnected and that something was not quite right.  [name] has a known prior history of a right pericallosal aneurysm with ischemic change in that distribution. He has already undergone stenting for this.  The MRI that was performed reveals the presence of a small punctate area of infarction in the right frontal region. There is also a subtle linear diffusion abnormality in the deep subcortical white matter of the right anterior frontal lobe. This is a new finding from the prior MRI performed on 09/21/2015.  At the present time the neurologic exam is unremarkable [name] appears to have some mild numbness distally in the fingertips but he is otherwise doing well.  Plan:  1. We will notify the stroke team of [name]'s presents for further recommendations and advice regarding his care.  Thank you for consulting the neurology team to assist in [name]'s care!  Elson Clan M.D. [facility] [phone]

## 2015-12-24 NOTE — ED Notes (Addendum)
Patient transported to CT by daniel tuttle  Paitent noted to have all symptoms of numbness resolved at present time.

## 2015-12-24 NOTE — ED Notes (Signed)
Patient states numbness in right hand returned mildly. No other neuro deficits noted. Dr. Ralene Bathe made aware.

## 2015-12-24 NOTE — ED Triage Notes (Signed)
Per EMS pt from home had come back from PCP and was talking to daughter on phone then started having right ear numbness and tingling which then spread throughout right side of face and then down into right arm. Patients right arm felt heavy.Patient was unable to feel himself touch the right side of his face. Upon EMS arrival patient numbness was just on face but was able to feel EMS touching him. Upon arrival to ED patient sts only gums are numb. Patient has no slurred speech, or weakness, facial droop. Patient denies pain or falls. Patient is on blood thinner from previous strokes.

## 2015-12-24 NOTE — Progress Notes (Signed)
Subjective:     Patient ID: [name], male   DOB: 10-19-1955, 60 y.o.   MRN: [id number]  HPI Interval medical history, developed tingling and numbness of both hands. He was evaluated by neurology and diagnosed with carpal tunnel. Received carpal tunnel injections which were helpful. Now was scheduled for EMG/NCV on August 7 with Dr. Felecia Shelling  Also, Lexapro was increased for anxiety symptoms and Fioricet was recommended for headaches which occur a couple times a week.  Fell while walking in a parking lot and once at home Hx of ACL rupture which was not  Pain Inventory Average Pain 0 Pain Right Now 0 My pain is NA  In the last 24 hours, has pain interfered with the following? General activity 0 Relation with others 0 Enjoyment of life 4 What TIME of day is your pain at its worst? NA Sleep (in general) Poor  Pain is worse with: NA Pain improves with: NA Relief from Meds: NA  Mobility walk without assistance ability to climb steps?  yes do you drive?  yes  Function employed # of hrs/week 40  Neuro/Psych trouble walking  Prior Studies Any changes since last visit?  no  Physicians involved in your care Any changes since last visit?  no   Family History  Problem Relation Age of Onset  . Stroke Mother   . Hypertension Mother   . Aneurysm Mother 74    Died of brain aneursym  . Heart failure Father   . Emphysema Father   . Diabetes Mellitus II Sister   . Colon cancer Neg Hx   . Rectal cancer Neg Hx   . Stomach cancer Neg Hx    Social History   Social History  . Marital status: Divorced    Spouse name: N/A  . Number of children: N/A  . Years of education: N/A   Social History Main Topics  . Smoking status: Never Smoker  . Smokeless tobacco: Never Used  . Alcohol use No  . Drug use: No  . Sexual activity: Not Asked   Other Topics Concern  . None   Social History Narrative   Separated, has 2 daughters, works as a Forensic psychologist, Therapist, occupational   Works at [employer], distribution center. 10-15-15   Past Surgical History:  Procedure Laterality Date  . INCISE AND DRAIN ABCESS     L elbow due to cellulitis/bursitis  . INNER EAR SURGERY    . KNEE ARTHROSCOPY    . RADIOLOGY WITH ANESTHESIA N/A 09/20/2015   Procedure: EMBOLIZATION         (RADIOLOGY WITH ANESTHESIA);  Surgeon: Luanne Bras, MD;  Location: [facility];  Service: Radiology;  Laterality: N/A;  . SKIN TAG REMOVAL     11 removed   Past Medical History:  Diagnosis Date  . Allergy   . Anxiety   . Cataract   . Chest pain, atypical    12/2003:  negative cardiolyte  . Depression   . Eczema   . Erectile dysfunction   . History of meniscal tear    bilateral  . HLD (hyperlipidemia)   . HTN (hypertension)   . Hx of tear of ACL (anterior cruciate ligament)    right  . Insomnia   . Olecranon bursitis of left elbow 10/2009   s/p I&D by Dr Maxie Better, initially assessed by Dr. Nori Riis   . Prediabetes   . TIA (transient ischemic attack)    BP 135/83   Pulse 61   Resp  17   SpO2 95%   Opioid Risk Score:   Fall Risk Score:  `1  Depression screen PHQ 2/9  Depression screen [facility] 2/9 11/12/2015 10/12/2015 03/08/2011 08/24/2010  Decreased Interest 0 0 0 0  Down, Depressed, Hopeless 0 3 0 0  PHQ - 2 Score 0 3 0 0  Altered sleeping - 3 - -  Tired, decreased energy - 3 - -  Change in appetite - 0 - -  Feeling bad or failure about yourself  - 1 - -  Trouble concentrating - 0 - -  Moving slowly or fidgety/restless - 0 - -  Suicidal thoughts - 0 - -  PHQ-9 Score - 10 - -  Difficult doing work/chores - Somewhat difficult - -    Review of Systems  Constitutional: Positive for unexpected weight change.  Respiratory: Positive for cough and wheezing.   Cardiovascular:       Limb swelling   Musculoskeletal: Positive for gait problem.  All other systems reviewed and are negative.      Objective:   Physical Exam Positive reverse Phalen's. Right hand causing tingling in second,  third and fourth digits. Negative Tinel's over bilateral wrists. Motor strength is 5/5 bilateral deltoid, biceps, triceps, grip, right hip flexor, knee extensor, ankle dorsiflexor Left knee positive Lachman sign Left knee negative effusion No medial lateral instability on the left side. Motor strength is 4/5 in the left knee extensor 5/5. Left ankle dorsiflexor and plantar flexor. No sensory deficits in the left knee area No pain with bending in the low back area. Negative straight leg raising    Assessment:     History of right cerebral infarcts, mild left lower extremity weakness, which may be causing some decreased stability at the left knee in a patient with chronic anterior cruciate ligament deficiency.     Plan:     We'll recommend strengthening exercises, Straight leg raises, described patient. May Ace wrap for now to increase proprioception, but I have ordered left knee arthrosis to be fitted by orthotist    Return to clinic when necessary basis  Follow-up with neurology in regards to carpal tunnel syndrome

## 2015-12-24 NOTE — ED Notes (Addendum)
Admitting PA at bedside.

## 2015-12-24 NOTE — ED Notes (Signed)
Pt unable to void at this time pt is aware of need void

## 2015-12-25 DIAGNOSIS — I340 Nonrheumatic mitral (valve) insufficiency: Secondary | ICD-10-CM | POA: Diagnosis not present

## 2015-12-25 DIAGNOSIS — T502X5A Adverse effect of carbonic-anhydrase inhibitors, benzothiadiazides and other diuretics, initial encounter: Secondary | ICD-10-CM | POA: Diagnosis present

## 2015-12-25 DIAGNOSIS — Z79899 Other long term (current) drug therapy: Secondary | ICD-10-CM | POA: Diagnosis not present

## 2015-12-25 DIAGNOSIS — K439 Ventral hernia without obstruction or gangrene: Secondary | ICD-10-CM | POA: Diagnosis present

## 2015-12-25 DIAGNOSIS — G459 Transient cerebral ischemic attack, unspecified: Secondary | ICD-10-CM | POA: Diagnosis not present

## 2015-12-25 DIAGNOSIS — G4700 Insomnia, unspecified: Secondary | ICD-10-CM | POA: Diagnosis present

## 2015-12-25 DIAGNOSIS — Z823 Family history of stroke: Secondary | ICD-10-CM | POA: Diagnosis not present

## 2015-12-25 DIAGNOSIS — Z7982 Long term (current) use of aspirin: Secondary | ICD-10-CM | POA: Diagnosis not present

## 2015-12-25 DIAGNOSIS — F419 Anxiety disorder, unspecified: Secondary | ICD-10-CM | POA: Diagnosis present

## 2015-12-25 DIAGNOSIS — I119 Hypertensive heart disease without heart failure: Secondary | ICD-10-CM | POA: Diagnosis present

## 2015-12-25 DIAGNOSIS — E1165 Type 2 diabetes mellitus with hyperglycemia: Secondary | ICD-10-CM | POA: Diagnosis present

## 2015-12-25 DIAGNOSIS — G8191 Hemiplegia, unspecified affecting right dominant side: Secondary | ICD-10-CM | POA: Diagnosis present

## 2015-12-25 DIAGNOSIS — Z6838 Body mass index (BMI) 38.0-38.9, adult: Secondary | ICD-10-CM | POA: Diagnosis not present

## 2015-12-25 DIAGNOSIS — G43109 Migraine with aura, not intractable, without status migrainosus: Secondary | ICD-10-CM | POA: Diagnosis not present

## 2015-12-25 DIAGNOSIS — Z8673 Personal history of transient ischemic attack (TIA), and cerebral infarction without residual deficits: Secondary | ICD-10-CM | POA: Diagnosis not present

## 2015-12-25 DIAGNOSIS — R200 Anesthesia of skin: Secondary | ICD-10-CM | POA: Diagnosis present

## 2015-12-25 DIAGNOSIS — E1159 Type 2 diabetes mellitus with other circulatory complications: Secondary | ICD-10-CM

## 2015-12-25 DIAGNOSIS — E876 Hypokalemia: Secondary | ICD-10-CM | POA: Diagnosis present

## 2015-12-25 DIAGNOSIS — Z7902 Long term (current) use of antithrombotics/antiplatelets: Secondary | ICD-10-CM | POA: Diagnosis not present

## 2015-12-25 DIAGNOSIS — H9193 Unspecified hearing loss, bilateral: Secondary | ICD-10-CM | POA: Diagnosis present

## 2015-12-25 DIAGNOSIS — E785 Hyperlipidemia, unspecified: Secondary | ICD-10-CM | POA: Diagnosis not present

## 2015-12-25 DIAGNOSIS — F329 Major depressive disorder, single episode, unspecified: Secondary | ICD-10-CM | POA: Diagnosis present

## 2015-12-25 DIAGNOSIS — E669 Obesity, unspecified: Secondary | ICD-10-CM | POA: Diagnosis present

## 2015-12-25 DIAGNOSIS — I10 Essential (primary) hypertension: Secondary | ICD-10-CM | POA: Diagnosis not present

## 2015-12-25 DIAGNOSIS — R1010 Upper abdominal pain, unspecified: Secondary | ICD-10-CM | POA: Diagnosis not present

## 2015-12-25 DIAGNOSIS — Z8249 Family history of ischemic heart disease and other diseases of the circulatory system: Secondary | ICD-10-CM | POA: Diagnosis not present

## 2015-12-25 DIAGNOSIS — I63421 Cerebral infarction due to embolism of right anterior cerebral artery: Secondary | ICD-10-CM | POA: Diagnosis present

## 2015-12-25 LAB — URINALYSIS, ROUTINE W REFLEX MICROSCOPIC
Bilirubin Urine: NEGATIVE
GLUCOSE, UA: NEGATIVE mg/dL
Hgb urine dipstick: NEGATIVE
Ketones, ur: NEGATIVE mg/dL
LEUKOCYTES UA: NEGATIVE
NITRITE: NEGATIVE
PH: 7 (ref 5.0–8.0)
PROTEIN: NEGATIVE mg/dL
Specific Gravity, Urine: 1.018 (ref 1.005–1.030)

## 2015-12-25 LAB — RAPID URINE DRUG SCREEN, HOSP PERFORMED
Amphetamines: NOT DETECTED
Barbiturates: NOT DETECTED
Benzodiazepines: NOT DETECTED
COCAINE: NOT DETECTED
OPIATES: NOT DETECTED
Tetrahydrocannabinol: NOT DETECTED

## 2015-12-25 LAB — GLUCOSE, CAPILLARY
GLUCOSE-CAPILLARY: 133 mg/dL — AB (ref 65–99)
GLUCOSE-CAPILLARY: 112 mg/dL — AB (ref 65–99)
Glucose-Capillary: 132 mg/dL — ABNORMAL HIGH (ref 65–99)
Glucose-Capillary: 137 mg/dL — ABNORMAL HIGH (ref 65–99)

## 2015-12-25 NOTE — Progress Notes (Signed)
PROGRESS NOTE    [name]  [id number] DOB: 07-Aug-1955 DOA: 12/24/2015 PCP: Berkley Harvey, NP (Confirm with patient/family/NH records and if not entered, this HAS to be entered at [facility] point of entry. "No PCP" if truly none.)   Brief Narrative: 60 y.o.malewith medical history significant for hypertension, hyperlipidemia, anxiety, tinnitus, prior history of cerebral hemorrhage with stent on 09/16/2015  , history of CVA, presented with right sided facial numbness, right arm heaviness. CT head with no acute finding, seen by neurology, differentials for simple partial seizures versus complicated migraine versus CVA which is less likely. MRI of the brain showed 5 mm punctate infarct on the right high frontal area, which does not correlate with patient's symptoms Assessment & Plan:   Principal Problem:   TIA (transient ischemic attack) Active Problems:   Hyperlipidemia   Hearing loss   Essential hypertension   Depression   Type 2 diabetes mellitus (HCC)   Leukocytosis   Stroke (HCC)   Migraine with aura and without status migrainosus, not intractable   Status post stroke   Bilateral hearing loss   Numbness  Stroke like symptoms in a patient with history of CVA, cerebral aneurysm with stent in 08/2015  DDX: TIA versus  Seizure, low normal blood pressure  Patient the symptoms do not correlate with the area lacunar infarct Continue with aspirin and Plavix Neuro on board-awaiting recommendations  CT head unremarkable.  Risk factor hypertension, HLD,  Recent CVA as above. LAst 2 D echo 08/2015, nl LVF, EF normal  Telemetry observation UDS     Hypertension BP 100/67   Pulse 60   Temp 98.5 F (36.9 C)   Resp 22   Ht 5\' 8"  (1.727 m)   Wt 112.5 kg (248 lb)   SpO2 93%   BMI 37.71 kg/m  Controlled. Allow permissive hypertension for now until Neuro recommendations  Continue home anti-hypertensive medications, holding HCTZ  Hypokalemia Replaced by  mouth   Hyperlipidemia Continue home statins  Anxiety/ Depression Continue home meds including Lexapro    Leukocytosis, appears chronic 10-11 No apparent infection  WBC 11.7  CXR NAD Afebrile  Obtain cultures . Will hold antibiotics for now.   Repeat CBC in AM  Type II Diabetes Current blood sugar level is 142 Continue the sliding scale insulin   DVT prophylaxis: Lovenox  Code Status: Full Family Communication: Patient Disposition Plan: Home.   Consultants:  Neurology    Subjective: Symptoms resolved. Concern about frequent TIAs  Objective: Vitals:   12/25/15 0200 12/25/15 0400 12/25/15 0600 12/25/15 0800  BP: 117/66 120/70 128/70 (!) 143/63  Pulse: 65 68 72 80  Resp: 19 20  18   Temp: 98.6 F (37 C) 98.3 F (36.8 C) 98 F (36.7 C) 98.5 F (36.9 C)  TempSrc: Oral Oral Oral Oral  SpO2: 96% 96% 96% 97%  Weight:    114.9 kg (253 lb 6.4 oz)  Height:    5\' 8"  (1.727 m)   No intake or output data in the 24 hours ending 12/25/15 1413 Filed Weights   12/24/15 1341 12/25/15 0800  Weight: 112.5 kg (248 lb) 114.9 kg (253 lb 6.4 oz)    Examination:  General exam: Appears calm and comfortable  Respiratory system: Clear to auscultation. Respiratory effort normal. Cardiovascular system: S1 & S2 heard, RRR. No JVD, murmurs, rubs, gallops or clicks. No pedal edema. Gastrointestinal system: Abdomen is nondistended, soft and nontender. No organomegaly or masses felt. Normal bowel sounds heard. Central nervous system: Alert and  oriented. No focal neurological deficits. Extremities: Symmetric 5 x 5 power. Skin: No rashes, lesions or ulcers Psychiatry: Judgement and insight appear normal. Mood & affect appropriate.     Data Reviewed: I have personally reviewed following labs and imaging studies  CBC:  Recent Labs Lab 12/24/15 1348  WBC 11.7*  NEUTROABS 7.8*  HGB 14.4  HCT 42.2  MCV 88.1  PLT 99991111   Basic Metabolic Panel:  Recent Labs Lab 12/24/15 1348   NA 138  K 3.3*  CL 104  CO2 26  GLUCOSE 142*  BUN 20  CREATININE 1.14  CALCIUM 9.1   GFR: Estimated Creatinine Clearance: 83.7 mL/min (by C-G formula based on SCr of 1.14 mg/dL). Liver Function Tests:  Recent Labs Lab 12/24/15 1348  AST 20  ALT 27  ALKPHOS 78  BILITOT 0.7  PROT 6.7  ALBUMIN 3.6   No results for input(s): LIPASE, AMYLASE in the last 168 hours. No results for input(s): AMMONIA in the last 168 hours. Coagulation Profile: No results for input(s): INR, PROTIME in the last 168 hours. Cardiac Enzymes: No results for input(s): CKTOTAL, CKMB, CKMBINDEX, TROPONINI in the last 168 hours. BNP (last 3 results) No results for input(s): PROBNP in the last 8760 hours. HbA1C: No results for input(s): HGBA1C in the last 72 hours. CBG:  Recent Labs Lab 12/24/15 1952 12/25/15 0623 12/25/15 1107  GLUCAP 103* 133* 112*   Lipid Profile: No results for input(s): CHOL, HDL, LDLCALC, TRIG, CHOLHDL, LDLDIRECT in the last 72 hours. Thyroid Function Tests: No results for input(s): TSH, T4TOTAL, FREET4, T3FREE, THYROIDAB in the last 72 hours. Anemia Panel: No results for input(s): VITAMINB12, FOLATE, FERRITIN, TIBC, IRON, RETICCTPCT in the last 72 hours. Sepsis Labs: No results for input(s): PROCALCITON, LATICACIDVEN in the last 168 hours.  Recent Results (from the past 240 hour(s))  Culture, blood (Routine X 2) w Reflex to ID Panel     Status: None (Preliminary result)   Collection Time: 12/24/15  6:26 PM  Result Value Ref Range Status   Specimen Description BLOOD RIGHT ANTECUBITAL  Final   Special Requests BOTTLES DRAWN AEROBIC AND ANAEROBIC 5CC  Final   Culture NO GROWTH < 24 HOURS  Final   Report Status PENDING  Incomplete  Culture, blood (Routine X 2) w Reflex to ID Panel     Status: None (Preliminary result)   Collection Time: 12/24/15  6:34 PM  Result Value Ref Range Status   Specimen Description BLOOD LEFT ANTECUBITAL  Final   Special Requests BOTTLES DRAWN  AEROBIC AND ANAEROBIC 5CC  Final   Culture NO GROWTH < 24 HOURS  Final   Report Status PENDING  Incomplete         Radiology Studies: Dg Chest 2 View  Result Date: 12/24/2015 CLINICAL DATA:  Right facial numbness. EXAM: CHEST  2 VIEW COMPARISON:  Radiographs of September 15, 2015. FINDINGS: The heart size and mediastinal contours are within normal limits. Both lungs are clear. No pneumothorax or pleural effusion is noted. The visualized skeletal structures are unremarkable. IMPRESSION: No active cardiopulmonary disease. Electronically Signed   By: [name], M.D.   On: 12/24/2015 15:22  Ct Head Wo Contrast  Result Date: 12/24/2015 CLINICAL DATA:  Facial numbness beginning at 12 noon today. EXAM: CT HEAD WITHOUT CONTRAST TECHNIQUE: Contiguous axial images were obtained from the base of the skull through the vertex without intravenous contrast. COMPARISON:  Brain MRI 09/21/2015.  Head CT scan 09/20/2015. 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 There is some chronic microvascular ischemic change. No evidence of acute intracranial abnormality including hemorrhage, infarct, mass lesion, mass effect, midline shift or abnormal extra-axial fluid collection is seen. Mucous retention cysts or polyps in the maxillary sinuses are identified. Mild ethmoid air cell disease is noted. The calvarium is intact. IMPRESSION: No acute abnormality. Chronic microvascular ischemic change. 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 Electronically Signed   By: Inge Rise M.D.   On: 12/24/2015 14:33  Mr Brain Wo Contrast  Result Date: 12/25/2015 CLINICAL DATA:  Initial evaluation for acute right-sided weakness. EXAM: MRI HEAD WITHOUT CONTRAST TECHNIQUE: Multiplanar, multiecho pulse sequences of the brain and surrounding structures were obtained without intravenous contrast. COMPARISON:  Prior CT from earlier the same day as well as previous MRI  from 09/21/2015. FINDINGS: Exam mildly degraded by motion artifact. Cerebral volume is stable from previous studies, and remains within normal limits for patient age. Mild scattered patchy T2/FLAIR hyperintensity within the periventricular white matter, most likely related to chronic small vessel ischemic disease. Mild chronic microvascular ischemic changes also noted within the pons. 5 mm focus of restricted diffusion present within the cortical gray matter of the anterior right frontal lobe near the vertex, consistent with a small acute ischemic cortical infarct (series 4, image 46). No associated hemorrhage or mass effect. This is right ACA territory. There is subtly increased linear diffusion abnormality within the deep/ subcortical white matter of the anterior right frontal lobe (series 4, image 36). No associated signal loss on ADC map. This demonstrates associated increased T2/FLAIR signal abnormality (series 7, image 18). This is new from prior, and likely reflects the sequela of late subacute to chronic white matter/small vessel ischemia. No other acute infarct. Gray-white matter differentiation otherwise maintained. Major intracranial vascular flow voids are preserved. Susceptibility artifact from pipeline device within the left ACA noted. 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 No acute intracranial hemorrhage seen on prior CT. No mass lesion, midline shift, or mass effect. No hydrocephalus. No extra-axial fluid collection. Major dural sinuses are grossly patent. Craniocervical junction normal. Visualized upper cervical spine unremarkable. Pituitary gland normal. No acute abnormality about the globes and orbits. Axial myopia noted. Scattered opacity mucosal thickening within the ethmoidal air cells and maxillary sinuses. Retention cyst noted within the right maxilla sinus. Small amount of layering opacity within the left sphenoid sinus.  No mastoid effusion. Inner ear structures grossly normal. Bone marrow signal intensity within normal limits. No scalp soft tissue abnormality. IMPRESSION: 1. Punctate 5 mm acute ischemic cortical infarct within the high right frontal lobe. No associated hemorrhage. 2. Subtle linear diffusion abnormality within the deep/subcortical white matter of the anterior right frontal lobe, most consistent with late subacute to chronic ischemic changes. This is consistent with progressive small vessel white matter disease, new relative to most recent MRI from 09/21/2015. 3. Pipeline stent in place left anterior cerebral artery. 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 Electronically Signed   By: Jeannine Boga M.D.   On: 12/25/2015 00:04       Scheduled Meds: . amLODipine  10 mg Oral Daily  . aspirin  325 mg Oral Daily  . atorvastatin  10 mg Oral q1800  . clopidogrel  75 mg Oral Daily  . escitalopram  20 mg Oral Daily  . insulin aspart  0-9 Units Subcutaneous TID WC  . lisinopril  40 mg Oral Daily  .  metoprolol  50 mg Oral BID   Continuous Infusions:    LOS: 0 days    Time spent: 50 minutes    Axxel Gude, MD [facility] Pager [phone]  If 7PM-7AM, please contact night-coverage [email or web address] Password [email or web address] 12/25/2015, 2:13 PM

## 2015-12-26 DIAGNOSIS — F329 Major depressive disorder, single episode, unspecified: Secondary | ICD-10-CM

## 2015-12-26 DIAGNOSIS — E1169 Type 2 diabetes mellitus with other specified complication: Secondary | ICD-10-CM

## 2015-12-26 LAB — GLUCOSE, CAPILLARY
GLUCOSE-CAPILLARY: 104 mg/dL — AB (ref 65–99)
GLUCOSE-CAPILLARY: 155 mg/dL — AB (ref 65–99)
Glucose-Capillary: 109 mg/dL — ABNORMAL HIGH (ref 65–99)
Glucose-Capillary: 110 mg/dL — ABNORMAL HIGH (ref 65–99)

## 2015-12-26 MED ORDER — STROKE: EARLY STAGES OF RECOVERY BOOK
Freq: Once | Status: AC
  Administered 2015-12-26: 22:00:00
  Filled 2015-12-26 (×2): qty 1

## 2015-12-26 MED ORDER — GUAIFENESIN-DM 100-10 MG/5ML PO SYRP
5.0000 mL | ORAL_SOLUTION | ORAL | Status: DC | PRN
  Administered 2015-12-26 – 2015-12-30 (×4): 5 mL via ORAL
  Filled 2015-12-26 (×4): qty 5

## 2015-12-26 NOTE — Progress Notes (Signed)
PROGRESS NOTE    [name]  [id number] DOB: 1955/09/10 DOA: 12/24/2015 PCP: Berkley Harvey, NP    Brief Narrative: 60 y.o.malewith medical history significant for hypertension, hyperlipidemia, anxiety, tinnitus, prior history of cerebral hemorrhage with stent on 09/16/2015  , history of CVA, presented with right sided facial numbness, right arm heaviness. CT head with no acute finding, seen by neurology, differentials for simple partial seizures versus complicated migraine versus CVA which is less likely. MRI of the brain showed 5 mm punctate infarct on the right high frontal area, which does not correlate with patient's symptoms. However concerning about patient's ongoing strokes 100 neurology recommends completing inpatient workup including EEG, TEE, loop recorder placement Assessment & Plan:   Principal Problem:   TIA (transient ischemic attack) Active Problems:   Hyperlipidemia   Hearing loss   Essential hypertension   Depression   Type 2 diabetes mellitus (HCC)   Leukocytosis   Stroke (HCC)   Migraine with aura and without status migrainosus, not intractable   Status post stroke   Bilateral hearing loss   Numbness  Stroke like symptoms in a patient with history of CVA, cerebral aneurysm with stent in 08/2015  MRI showed 0.5 cm stroke in high frontal area DDX: TIA versus  Seizure, low normal blood pressure Patient the symptoms do not correlate with the area lacunar infarct Continue with aspirin and Plavix Neuro on board-recommended complete inpatient workup with EEG, TEE, loop recorder placement.  Risk factor hypertension, HLD,  Recent CVA as above. LAst 2 D echo 08/2015, nl LVF, EF normal  Telemetry observation    Hypertension   Controlled. Allow permissive hypertension for now until Neuro recommendations  Continue home anti-hypertensive medications, holding HCTZ  Hypokalemia Replaced by mouth   Hyperlipidemia Continue home statins  Anxiety/  Depression Continue home meds including Lexapro    Leukocytosis, appears chronic 10-11 No apparent infection  WBC 11.7  CXR NAD Afebrile  Cultures- negative to date . Has chronically elevated WBC at this baseline     Type II Diabetes Current blood sugar level is 142 Continue the sliding scale insulin  Obesity BMI of 38 Counseling done regarding diet and exercise   DVT prophylaxis: Lovenox  Code Status: Full Family Communication: Patient Disposition Plan: Home.   Consultants:  Neurology    Subjective: Symptoms resolved. Concern about frequent TIAs  Objective: Vitals:   12/25/15 0800 12/26/15 0108 12/26/15 0526 12/26/15 1008  BP:  (!) 100/45 119/71 115/64  Pulse:  67 63 66  Resp:  18 18 16   Temp:  98.4 F (36.9 C) 98.5 F (36.9 C) 98.5 F (36.9 C)  TempSrc:  Oral Oral Oral  SpO2:  96% 96% 98%  Weight: 114.9 kg (253 lb 6.4 oz)     Height: 5\' 8"  (1.727 m)      No intake or output data in the 24 hours ending 12/26/15 1353 Filed Weights   12/24/15 1341 12/25/15 0800  Weight: 112.5 kg (248 lb) 114.9 kg (253 lb 6.4 oz)    Examination:  General exam: Appears calm and comfortable  Respiratory system: Clear to auscultation. Respiratory effort normal. Cardiovascular system: S1 & S2 heard, RRR. No JVD, murmurs, rubs, gallops or clicks. No pedal edema. Gastrointestinal system: Abdomen is nondistended, soft and nontender. No organomegaly or masses felt. Normal bowel sounds heard. Central nervous system: Alert and oriented. No focal neurological deficits. Extremities: Symmetric 5 x 5 power. Skin: No rashes, lesions or ulcers Psychiatry: Judgement and insight appear normal. Mood &  affect appropriate.     Data Reviewed: I have personally reviewed following labs and imaging studies  CBC:  Recent Labs Lab 12/24/15 1348  WBC 11.7*  NEUTROABS 7.8*  HGB 14.4  HCT 42.2  MCV 88.1  PLT 99991111   Basic Metabolic Panel:  Recent Labs Lab 12/24/15 1348  NA 138  K  3.3*  CL 104  CO2 26  GLUCOSE 142*  BUN 20  CREATININE 1.14  CALCIUM 9.1   GFR: Estimated Creatinine Clearance: 83.7 mL/min (by C-G formula based on SCr of 1.14 mg/dL). Liver Function Tests:  Recent Labs Lab 12/24/15 1348  AST 20  ALT 27  ALKPHOS 78  BILITOT 0.7  PROT 6.7  ALBUMIN 3.6   No results for input(s): LIPASE, AMYLASE in the last 168 hours. No results for input(s): AMMONIA in the last 168 hours. Coagulation Profile: No results for input(s): INR, PROTIME in the last 168 hours. Cardiac Enzymes: No results for input(s): CKTOTAL, CKMB, CKMBINDEX, TROPONINI in the last 168 hours. BNP (last 3 results) No results for input(s): PROBNP in the last 8760 hours. HbA1C: No results for input(s): HGBA1C in the last 72 hours. CBG:  Recent Labs Lab 12/25/15 1107 12/25/15 1607 12/25/15 2311 12/26/15 0630 12/26/15 1141  GLUCAP 112* 132* 137* 104* 155*   Lipid Profile: No results for input(s): CHOL, HDL, LDLCALC, TRIG, CHOLHDL, LDLDIRECT in the last 72 hours. Thyroid Function Tests: No results for input(s): TSH, T4TOTAL, FREET4, T3FREE, THYROIDAB in the last 72 hours. Anemia Panel: No results for input(s): VITAMINB12, FOLATE, FERRITIN, TIBC, IRON, RETICCTPCT in the last 72 hours. Sepsis Labs: No results for input(s): PROCALCITON, LATICACIDVEN in the last 168 hours.  Recent Results (from the past 240 hour(s))  Culture, blood (Routine X 2) w Reflex to ID Panel     Status: None (Preliminary result)   Collection Time: 12/24/15  6:26 PM  Result Value Ref Range Status   Specimen Description BLOOD RIGHT ANTECUBITAL  Final   Special Requests BOTTLES DRAWN AEROBIC AND ANAEROBIC 5CC  Final   Culture NO GROWTH 2 DAYS  Final   Report Status PENDING  Incomplete  Culture, blood (Routine X 2) w Reflex to ID Panel     Status: None (Preliminary result)   Collection Time: 12/24/15  6:34 PM  Result Value Ref Range Status   Specimen Description BLOOD LEFT ANTECUBITAL  Final   Special  Requests BOTTLES DRAWN AEROBIC AND ANAEROBIC 5CC  Final   Culture NO GROWTH 2 DAYS  Final   Report Status PENDING  Incomplete         Radiology Studies: Dg Chest 2 View  Result Date: 12/24/2015 CLINICAL DATA:  Right facial numbness. EXAM: CHEST  2 VIEW COMPARISON:  Radiographs of September 15, 2015. FINDINGS: The heart size and mediastinal contours are within normal limits. Both lungs are clear. No pneumothorax or pleural effusion is noted. The visualized skeletal structures are unremarkable. IMPRESSION: No active cardiopulmonary disease. Electronically Signed   By: [name], M.D.   On: 12/24/2015 15:22  Ct Head Wo Contrast  Result Date: 12/24/2015 CLINICAL DATA:  Facial numbness beginning at 12 noon today. EXAM: CT HEAD WITHOUT CONTRAST TECHNIQUE: Contiguous axial images were obtained from the base of the skull through the vertex without intravenous contrast. COMPARISON:  Brain MRI 09/21/2015.  Head CT scan 09/20/2015. 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 There is some chronic microvascular ischemic change. No evidence of acute intracranial abnormality  including hemorrhage, infarct, mass lesion, mass effect, midline shift or abnormal extra-axial fluid collection is seen. Mucous retention cysts or polyps in the maxillary sinuses are identified. Mild ethmoid air cell disease is noted. The calvarium is intact. IMPRESSION: No acute abnormality. Chronic microvascular ischemic change. 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 Electronically Signed   By: Inge Rise M.D.   On: 12/24/2015 14:33  Mr Brain Wo Contrast  Result Date: 12/25/2015 CLINICAL DATA:  Initial evaluation for acute right-sided weakness. EXAM: MRI HEAD WITHOUT CONTRAST TECHNIQUE: Multiplanar, multiecho pulse sequences of the brain and surrounding structures were obtained without intravenous contrast. COMPARISON:  Prior CT from earlier the same day as  well as previous MRI from 09/21/2015. FINDINGS: Exam mildly degraded by motion artifact. Cerebral volume is stable from previous studies, and remains within normal limits for patient age. Mild scattered patchy T2/FLAIR hyperintensity within the periventricular white matter, most likely related to chronic small vessel ischemic disease. Mild chronic microvascular ischemic changes also noted within the pons. 5 mm focus of restricted diffusion present within the cortical gray matter of the anterior right frontal lobe near the vertex, consistent with a small acute ischemic cortical infarct (series 4, image 46). No associated hemorrhage or mass effect. This is right ACA territory. There is subtly increased linear diffusion abnormality within the deep/ subcortical white matter of the anterior right frontal lobe (series 4, image 36). No associated signal loss on ADC map. This demonstrates associated increased T2/FLAIR signal abnormality (series 7, image 18). This is new from prior, and likely reflects the sequela of late subacute to chronic white matter/small vessel ischemia. No other acute infarct. Gray-white matter differentiation otherwise maintained. Major intracranial vascular flow voids are preserved. Susceptibility artifact from pipeline device within the left ACA noted. 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 No acute intracranial hemorrhage seen on prior CT. No mass lesion, midline shift, or mass effect. No hydrocephalus. No extra-axial fluid collection. Major dural sinuses are grossly patent. Craniocervical junction normal. Visualized upper cervical spine unremarkable. Pituitary gland normal. No acute abnormality about the globes and orbits. Axial myopia noted. Scattered opacity mucosal thickening within the ethmoidal air cells and maxillary sinuses. Retention cyst noted within the right maxilla sinus. Small amount of layering opacity within the  left sphenoid sinus. No mastoid effusion. Inner ear structures grossly normal. Bone marrow signal intensity within normal limits. No scalp soft tissue abnormality. IMPRESSION: 1. Punctate 5 mm acute ischemic cortical infarct within the high right frontal lobe. No associated hemorrhage. 2. Subtle linear diffusion abnormality within the deep/subcortical white matter of the anterior right frontal lobe, most consistent with late subacute to chronic ischemic changes. This is consistent with progressive small vessel white matter disease, new relative to most recent MRI from 09/21/2015. 3. Pipeline stent in place left anterior cerebral artery. 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 Electronically Signed   By: Jeannine Boga M.D.   On: 12/25/2015 00:04       Scheduled Meds: . amLODipine  10 mg Oral Daily  . aspirin  325 mg Oral Daily  . atorvastatin  10 mg Oral q1800  . clopidogrel  75 mg Oral Daily  . escitalopram  20 mg Oral Daily  . insulin aspart  0-9 Units Subcutaneous TID WC  . lisinopril  40 mg Oral Daily  . metoprolol  50 mg Oral BID   Continuous Infusions:    LOS: 1 day    Time spent:  50 minutes    Sadhana Frater, MD [facility] Pager [phone]  If 7PM-7AM, please contact night-coverage [email or web address] Password [email or web address] 12/26/2015, 1:53 PM

## 2015-12-26 NOTE — Progress Notes (Addendum)
STROKE TEAM PROGRESS NOTE   HISTORY OF PRESENT ILLNESS (per record) [name] is an 60 y.o. male with h/o migraines since he was young. 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 Today He was on the phone with his daughter when he noted his right ear became numb then his right face and gums--this progressed to his right arm with tunnel vision and wavy lines. After then numbness cleared his arm was heavy. This has fully cleared at this time. He had no HA prior or after. He does have a history of migraines but cannot recall what those symptoms were like. History is vague as he is hard of hearing and having a hard time understanding my questions and recalling specifics about the event.   Date last known well: Today Time last known well: Time: 12:30 tPA Given: No: symptoms resolved.    SUBJECTIVE (INTERVAL HISTORY) His family is not at the bedside.  Overall he feels his condition is completely resolved. He states he no longer has right scalp, face and arm tingling.  He reports that at onset of the event, the right side was also weak and now that has also resolved.  We discussed the fact that the stroke is on right side of the brain and the possible etiology of symptoms.  He stated understanding of the definition of TIA and partial seizure.  He recalled historically that one of the vascular events he had previously presented with was associated with a two hour period of time he "lost" with amnesia to the event.  I confirmed several historic facts:  He does have a PCP but does not want to return to the the same Nurse Practitioner in the future.  He will need close follow-up.  Thus he may need a recommendation for a new provider  He reports compliance with ASA and Plavix and event happened despsite dual anti-platelet therapy   OBJECTIVE Temp:  [97.7 F (36.5 C)-98.6 F (37 C)] 98.5 F (36.9 C)  (07/30 1008) Pulse Rate:  [61-68] 66 (07/30 1008) Cardiac Rhythm: Normal sinus rhythm (07/30 0708) Resp:  [16-18] 16 (07/30 1008) BP: (100-150)/(45-85) 115/64 (07/30 1008) SpO2:  [96 %-99 %] 98 % (07/30 1008)  CBC:   Recent Labs Lab 12/24/15 1348  WBC 11.7*  NEUTROABS 7.8*  HGB 14.4  HCT 42.2  MCV 88.1  PLT 99991111    Basic Metabolic Panel:   Recent Labs Lab 12/24/15 1348  NA 138  K 3.3*  CL 104  CO2 26  GLUCOSE 142*  BUN 20  CREATININE 1.14  CALCIUM 9.1    Lipid Panel:     Component Value Date/Time   CHOL 158 09/22/2015 0354   TRIG 145 09/22/2015 0354   HDL 34 (L) 09/22/2015 0354   CHOLHDL 4.6 09/22/2015 0354   VLDL 29 09/22/2015 0354   LDLCALC 95 09/22/2015 0354   HgbA1c:  Lab Results  Component Value Date   HGBA1C 6.1 (H) 09/22/2015   Urine Drug Screen:     Component Value Date/Time   LABOPIA NONE DETECTED 12/25/2015 2115   COCAINSCRNUR NONE DETECTED 12/25/2015 2115   COCAINSCRNUR NEG 06/07/2006 0039   LABBENZ NONE DETECTED 12/25/2015 2115   LABBENZ NEG 06/07/2006 0039   AMPHETMU NONE DETECTED 12/25/2015 2115   THCU NONE DETECTED 12/25/2015 2115   LABBARB NONE DETECTED 12/25/2015 2115      IMAGING   Dg Chest 2 View 12/24/2015 No  active cardiopulmonary disease.   Ct Head Wo Contrast 12/24/2015 No acute abnormality. Chronic microvascular ischemic change. 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   Mr Brain Wo Contrast 12/25/2015 1. Punctate 5 mm acute ischemic cortical infarct within the high right frontal lobe. No associated hemorrhage.  2. Subtle linear diffusion abnormality within the deep/subcortical white matter of the anterior right frontal lobe, most consistent with late subacute to chronic ischemic changes. This is consistent with progressive small vessel white matter disease, new relative to most recent MRI from 09/21/2015.  3. Pipeline stent in place left anterior cerebral artery. Progressive thrombus within the  known pericallosal aneurysm.   PHYSICAL EXAM HEENT-  Normocephalic, no lesions, without obvious abnormality.  Normal external eye and conjunctiva.   Cardiovascular- S1, S2 normal, pulses palpable throughout   Lungs- chest clear, no wheezing, rales, normal symmetric air entry Abdomen- normal findings: bowel sounds normal Extremities- no edema Lymph-no adenopathy palpable Musculoskeletal-no joint tenderness, deformity or swelling.  No left knee pain  Neurological Examination Mental Status: Alert, oriented, thought content appropriate.  Speech fluent without evidence of aphasia.  Able to follow 3 step commands without difficulty. Cranial Nerves: II:  Visual fields grossly normal, pupils equal, round, reactive to light and accommodation III,IV, VI: ptosis not present, extra-ocular motions intact bilaterally V,VII: smile symmetric, facial light touch sensation normal bilaterally VIII: hearing significantly decreased on the left and also decreased on the right.  IX,X: uvula rises symmetrically XI: bilateral shoulder shrug XII: midline tongue extension Motor: Right :            Upper extremity   5/5                                                                              Left:     Upper extremity   5/5                       Lower extremity   5/5                                                                                           Lower extremity   5/5 Tone and bulk:normal tone throughout; no atrophy noted Sensory: Light touch intact throughout, bilaterally Cerebellar: normal finger-to-nose; left hand tremulous but no pass pointing Gait: normal    ASSESSMENT/PLAN [name] is a 60 y.o. 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 He did not receive IV t-PA due to improvement in deficits.  Stroke:  Non-dominant infarct probably embolic from an unknown source.  Resultant  deficits resolved  MRI -  Punctate 5 mm acute ischemic cortical infarct within the high right frontal lobe  MRA - not performed  Carotid Doppler - not performed  2D Echo - not performed  LDL - not performed  HgbA1c - not performed  VTE prophylaxis - SCDs DIET SOFT Room service appropriate? Yes; Fluid consistency: Thin  aspirin 325 mg daily and clopidogrel 75 mg daily prior to admission, now on aspirin 325 mg daily and clopidogrel 75 mg daily  Patient counseled to be compliant with his antithrombotic medications  Ongoing aggressive stroke risk factor management  Therapy recommendations: Pending  Disposition: Pending  Hypertension  Blood pressure tends to run low. Medications to be adjusted.  Permissive hypertension (OK if < 220/120) but gradually normalize in 5-7 days  Long-term BP goal normotensive    Other Stroke Risk Factors  Advanced age  Obesity, Body mass index is 38.53 kg/m., recommend weight loss, diet and exercise as appropriate   Hx stroke/TIA  Family hx stroke (mother)  Migraines   Other Active Problems  Mild hypokalemia  Mild leukocytosis  ATTENDING NOTE: Patient was seen and examined by me personally. Documentation reflects findings. The laboratory and radiographic studies reviewed by me. ROS completed by me personally and pertinent positives fully documented Condition:  Stable  Assessment and plan completed by me personally and fully documented above. Plans/Recommendations include:     PLAN/RECOMMENDATIONS  Would recommend completion of stroke workup prior to discharge.  Ongoing events despite ASA / Plavix therapy.  Recommend TEE and possible loop.  Lipid profile  Carotids  An EEG has been ordered but not performed. Will advise regarding driving  after EEG has been read.  Consult Dr Estanislado Pandy to discuss possible stroke etiology.  Ongoing events despite ASA / Plavix therapy.  Primary team to address hypokalemia and leukocytosis noted on admission   SIGNED BY: Dr. Laddie Aquas day # 1     To contact Stroke Continuity provider, please refer to [email or web address] After hours, contact General Neurology

## 2015-12-27 ENCOUNTER — Encounter (HOSPITAL_COMMUNITY): Payer: Self-pay | Admitting: *Deleted

## 2015-12-27 ENCOUNTER — Inpatient Hospital Stay (HOSPITAL_COMMUNITY): Payer: Managed Care, Other (non HMO)

## 2015-12-27 DIAGNOSIS — I63421 Cerebral infarction due to embolism of right anterior cerebral artery: Secondary | ICD-10-CM

## 2015-12-27 LAB — GLUCOSE, CAPILLARY
GLUCOSE-CAPILLARY: 112 mg/dL — AB (ref 65–99)
GLUCOSE-CAPILLARY: 120 mg/dL — AB (ref 65–99)
GLUCOSE-CAPILLARY: 180 mg/dL — AB (ref 65–99)

## 2015-12-27 MED ORDER — METOPROLOL TARTRATE 25 MG PO TABS
25.0000 mg | ORAL_TABLET | Freq: Two times a day (BID) | ORAL | Status: DC
  Administered 2015-12-28 – 2015-12-31 (×7): 25 mg via ORAL
  Filled 2015-12-27 (×7): qty 1

## 2015-12-27 NOTE — Progress Notes (Signed)
VASCULAR LAB PRELIMINARY  PRELIMINARY  PRELIMINARY  PRELIMINARY  Carotid duplex has been completed.    Normal ICA's,No evidence of stenosis. Vertebral flow is antegrade.  Brenya Taulbee, RVT, RDMS 12/27/2015, 11:55 AM

## 2015-12-27 NOTE — Care Management Note (Signed)
Case Management Note  Patient Details  Name: [name] MRN: [id number] Date of Birth: May 18, 1956  Subjective/Objective: Pt admitted with CVA. He is from home alone.                  Action/Plan: Continued medical work up. CM following for discharge needs.   Expected Discharge Date:  12/27/15               Expected Discharge Plan:  Home/Self Care  In-House Referral:     Discharge planning Services     Post Acute Care Choice:    Choice offered to:     DME Arranged:    DME Agency:     HH Arranged:    HH Agency:     Status of Service:  In process, will continue to follow  If discussed at Long Length of Stay Meetings, dates discussed:    Additional Comments:  Pollie Friar, RN 12/27/2015, 3:26 PM

## 2015-12-27 NOTE — Progress Notes (Signed)
    [facility] has been requested to perform a transesophageal echocardiogram on this patient for stroke by internal medicine. After careful review of history and examination, the risks and benefits of transesophageal echocardiogram have been explained including risks of esophageal damage, perforation (1:10,000 risk), bleeding, pharyngeal hematoma as well as other potential complications associated with conscious sedation including aspiration, arrhythmia, respiratory failure and death. Alternatives to treatment were discussed, questions were answered.   The patient questioned whether or not to proceed as he was informed by neuro that he may need a cerebral angiogram instead. I spoke with Dr. Erlinda Hong who confirmed that behind the scenes there were discussions for angiogram rather than the TEE. Dr. Erlinda Hong asked [place] to hold off scheduling for now. I tried to call in the patient's room again but he did not answer so  I asked his nurse to relay the update.  Please let us know if further assistance is needed.  Charlie Pitter, PA-C 12/27/2015 3:59 PM

## 2015-12-27 NOTE — Progress Notes (Signed)
STROKE TEAM PROGRESS NOTE   SUBJECTIVE (INTERVAL HISTORY) No family at bedside. He stated that he had right head, face numbness and then moved to right hand ant then whole arm lasting 15-18min and now resolved. He is concerning that he had stroke. Primary team ordered TEE. I told pt that we would likely to look at his right ACA aneurysm and pipeline stent and if nothing found we will likely to do TEE at that time. He agreed.    OBJECTIVE Temp:  [97.7 F (36.5 C)-98.6 F (37 C)] 97.7 F (36.5 C) (07/31 1348) Pulse Rate:  [60-74] 67 (07/31 1348) Cardiac Rhythm: Normal sinus rhythm (07/31 0700) Resp:  [16-20] 18 (07/31 1348) BP: (97-122)/(49-72) 106/52 (07/31 1348) SpO2:  [94 %-99 %] 94 % (07/31 1348)  CBC:   Recent Labs Lab 12/24/15 1348  WBC 11.7*  NEUTROABS 7.8*  HGB 14.4  HCT 42.2  MCV 88.1  PLT 99991111    Basic Metabolic Panel:   Recent Labs Lab 12/24/15 1348  NA 138  K 3.3*  CL 104  CO2 26  GLUCOSE 142*  BUN 20  CREATININE 1.14  CALCIUM 9.1    Lipid Panel:     Component Value Date/Time   CHOL 158 09/22/2015 0354   TRIG 145 09/22/2015 0354   HDL 34 (L) 09/22/2015 0354   CHOLHDL 4.6 09/22/2015 0354   VLDL 29 09/22/2015 0354   LDLCALC 95 09/22/2015 0354   HgbA1c:  Lab Results  Component Value Date   HGBA1C 6.1 (H) 09/22/2015   Urine Drug Screen:     Component Value Date/Time   LABOPIA NONE DETECTED 12/25/2015 2115   COCAINSCRNUR NONE DETECTED 12/25/2015 2115   COCAINSCRNUR NEG 06/07/2006 0039   LABBENZ NONE DETECTED 12/25/2015 2115   LABBENZ NEG 06/07/2006 0039   AMPHETMU NONE DETECTED 12/25/2015 2115   THCU NONE DETECTED 12/25/2015 2115   LABBARB NONE DETECTED 12/25/2015 2115      IMAGING  I have personally reviewed the radiological images below and agree with the radiology interpretations.  Dg Chest 2 View 12/24/2015 No active cardiopulmonary disease.   Ct Head Wo Contrast 12/24/2015 No acute abnormality. Chronic microvascular ischemic  change. 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   Mr Brain Wo Contrast 12/25/2015 1. Punctate 5 mm acute ischemic cortical infarct within the high right frontal lobe. No associated hemorrhage.  2. Subtle linear diffusion abnormality within the deep/subcortical white matter of the anterior right frontal lobe, most consistent with late subacute to chronic ischemic changes. This is consistent with progressive small vessel white matter disease, new relative to most recent MRI from 09/21/2015.  3. Pipeline stent in place left anterior cerebral artery. 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   2D Echocardiogram  - Left ventricle: The cavity size was normal. Systolic function was normal. The estimated ejection fraction was in the range of 60% to 65%. Wall motion was normal; there were no regional wall motion abnormalities. Left ventricular diastolic function parameters were normal. - Mitral valve: There was no regurgitation. - Right ventricle: The cavity size was normal. Wall thickness was normal. Systolic function was normal. - Atrial septum: No defect or patent foramen ovale was identified by color flow Doppler. - Tricuspid valve: There was no regurgitation. - Inferior vena cava: The vessel was normal in size. The respirophasic diameter changes were in the normal range (>= 50%), consistent with normal central venous pressure.  Carotid Doppler   There is 1-39% bilateral ICA stenosis. Vertebral  artery flow is antegrade.      PHYSICAL EXAM HEENT-  Normocephalic, no lesions, without obvious abnormality.  Normal external eye and conjunctiva.   Cardiovascular- S1, S2 normal, pulses palpable throughout   Lungs- chest clear, no wheezing, rales, normal symmetric air entry Abdomen- normal findings: bowel sounds normal Extremities- no edema Lymph-no adenopathy palpable Musculoskeletal-no joint tenderness, deformity or swelling.  No left knee  pain  Neurological Examination Mental Status: Alert, oriented, thought content appropriate.  Speech fluent without evidence of aphasia.  Able to follow 3 step commands without difficulty. Cranial Nerves: II:  Visual fields grossly normal, pupils equal, round, reactive to light and accommodation III,IV, VI: ptosis not present, extra-ocular motions intact bilaterally V,VII: smile symmetric, facial light touch sensation normal bilaterally VIII: hearing significantly decreased on the left and also decreased on the right.  IX,X: uvula rises symmetrically XI: bilateral shoulder shrug XII: midline tongue extension Motor: Right :            Upper extremity   5/5                                                                              Left:     Upper extremity   5/5                       Lower extremity   5/5                                                                                           Lower extremity   5/5 Tone and bulk:normal tone throughout; no atrophy noted Sensory: Light touch intact throughout, bilaterally Cerebellar: normal finger-to-nose; left hand tremulous but no pass pointing Gait: normal   ASSESSMENT/PLAN Mr. DOLL STELTZ is a 60 y.o. 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 He did not receive IV t-PA due to improvement in deficits.  Stroke:  Incidental non-dominant R frontal infarcts in setting of recent R ACA pipeline stent placement, concern for re-stenosis or residue aneurysm  Resultant  deficits resolved  MRI - Punctate 5 mm acute ischemic cortical infarct within the high right frontal lobe  MRA - not performed  Carotid Doppler No significant stenosis   2D Echo EF 60-65%. No source of embolus   Cerebral angiogram planned tomorrow  May consider TEE depends on  cerebral angio findings   LDL - pending   HgbA1c - pending  VTE prophylaxis - SCDs DIET SOFT Room service appropriate? Yes; Fluid consistency: Thin Diet NPO time specified Except for: Sips with Meds  aspirin 325 mg daily and clopidogrel 75 mg daily prior to admission, now on aspirin 325 mg  daily and clopidogrel 75 mg daily. Will check P2Y12 level tomorrow am.  Patient counseled to be compliant with his antithrombotic medications  Ongoing aggressive stroke risk factor management  Therapy recommendations: Pending  Disposition: Pending  Right scalp, face and arm numbness - ? Complicated migraine  Not fit to the stroke this time  Likely due to complicated migraine equivalent  Prescribed depakote last admission  Will check depakote level in am  Hypertension BP tends to run low. Discontinue norvasc, and cut metoprolol in half, continue lisinopril Close monitoring Avoid hypotension BP goal normotensive  Hyperlipidemia  On home lipitor 10  LDL pending, goal < 70  Continue home lipitor  Other Stroke Risk Factors  Advanced age  Obesity, Body mass index is 38.53 kg/m., recommend weight loss, diet and exercise as appropriate   Hx stroke/TIA  Family hx stroke (mother)  Migraines  Other Active Problems  Mild hypokalemia  Mild leukocytosis  Hospital day # 2  Rosalin Hawking, MD PhD Stroke Neurology 12/27/2015 5:54 PM   To contact Stroke Continuity provider, please refer to [email or web address] After hours, contact General Neurology

## 2015-12-27 NOTE — Progress Notes (Signed)
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 Admitted for further strojke work up   Acute CVA. MRI brain: Punctate 5 mm acute ischemic cortical infarct within the high right frontal lobe. Symptoms have resolved. Neuro exam is non focal. Stroke work up is in progress, pend EEG per neurology, lipids. Recent blood work: HA1c-6.1. LDL-95. HDL-34 -cont ASA/plavix, statin. I have consulted cardiology for TEE/loop recorder , being scheduled for tomorrow. Defer further management to neurology. appreciate the input    Hypertension . Allowed permissive hypertension, then resumed home anti-hypertensive medications, holding HCTZ  Hypokalemia likely due to diuretic use. replaced by mouth. Recheck AM Hyperlipidemia Continue home statins Anxiety/ Depression Continue home meds including Lexapro  Code Status: full Family Communication:  D/w patient, RN (indicate person spoken with, relationship, and if by phone, the number) Disposition Plan: home pend TEE   Consultants:  Neurology   Procedures:  TEE 8/1  Antibiotics:  none (indicate start date, and stop date if known)  HPI/Subjective: Alert,. No distress, no focal neuro symptoms   Objective: Vitals:   12/27/15 0111 12/27/15 0531  BP: (!) 108/52 121/60  Pulse: 74 60  Resp: 18 18  Temp:  98.3 F (36.8 C)    Intake/Output Summary (Last 24 hours) at 12/27/15 0919 Last data filed at 12/27/15 0820  Gross per 24 hour  Intake              360 ml  Output                0 ml  Net              360 ml   Filed Weights   12/24/15 1341 12/25/15 0800  Weight: 112.5 kg (248 lb) 114.9 kg (253 lb 6.4  oz)    Exam:   General:  Comfortable   Cardiovascular: s1,s2 rrr  Respiratory: CTA BL  Abdomen: soft, nt,nd   Musculoskeletal: no leg edema    Data Reviewed: Basic Metabolic Panel:  Recent Labs Lab 12/24/15 1348  NA 138  K 3.3*  CL 104  CO2 26  GLUCOSE 142*  BUN 20  CREATININE 1.14  CALCIUM 9.1   Liver Function Tests:  Recent Labs Lab 12/24/15 1348  AST 20  ALT 27  ALKPHOS 78  BILITOT 0.7  PROT 6.7  ALBUMIN 3.6   No results for input(s): LIPASE, AMYLASE in the last 168 hours. No results for input(s): AMMONIA in the last 168 hours. CBC:  Recent Labs Lab 12/24/15 1348  WBC 11.7*  NEUTROABS 7.8*  HGB 14.4  HCT 42.2  MCV 88.1  PLT 310   Cardiac Enzymes: No results for input(s): CKTOTAL, CKMB, CKMBINDEX, TROPONINI in the last 168 hours. BNP (last 3 results) No results for input(s): BNP in the last 8760 hours.  ProBNP (last 3 results) No results for input(s): PROBNP in the last 8760 hours.  CBG:  Recent Labs Lab 12/26/15 0630 12/26/15 1141 12/26/15 1652 12/26/15 2130 12/27/15 0629  GLUCAP 104* 155* 109* 110* 112*    Recent Results (from the past 240 hour(s))  Culture, blood (Routine X 2) w Reflex to ID Panel     Status: None (Preliminary result)  Collection Time: 12/24/15  6:26 PM  Result Value Ref Range Status   Specimen Description BLOOD RIGHT ANTECUBITAL  Final   Special Requests BOTTLES DRAWN AEROBIC AND ANAEROBIC 5CC  Final   Culture NO GROWTH 2 DAYS  Final   Report Status PENDING  Incomplete  Culture, blood (Routine X 2) w Reflex to ID Panel     Status: None (Preliminary result)   Collection Time: 12/24/15  6:34 PM  Result Value Ref Range Status   Specimen Description BLOOD LEFT ANTECUBITAL  Final   Special Requests BOTTLES DRAWN AEROBIC AND ANAEROBIC 5CC  Final   Culture NO GROWTH 2 DAYS  Final   Report Status PENDING  Incomplete     Studies: No results found.  Scheduled Meds: . amLODipine  10 mg Oral Daily  .  aspirin  325 mg Oral Daily  . atorvastatin  10 mg Oral q1800  . clopidogrel  75 mg Oral Daily  . escitalopram  20 mg Oral Daily  . insulin aspart  0-9 Units Subcutaneous TID WC  . lisinopril  40 mg Oral Daily  . metoprolol  50 mg Oral BID   Continuous Infusions:   Principal Problem:   TIA (transient ischemic attack) Active Problems:   Hyperlipidemia   Hearing loss   Essential hypertension   Depression   Type 2 diabetes mellitus (HCC)   Leukocytosis   Cerebrovascular accident (CVA) due to embolism of right anterior cerebral artery ([place])   Migraine with aura and without status migrainosus, not intractable   Status post stroke   Bilateral hearing loss   Numbness    Time spent: >35 minutes     Kinnie Feil  [facility] Pager [phone]. If 7PM-7AM, please contact night-coverage at [email or web address], password [facility] 12/27/2015, 9:19 AM  LOS: 2 days

## 2015-12-28 ENCOUNTER — Inpatient Hospital Stay (HOSPITAL_COMMUNITY): Payer: Managed Care, Other (non HMO)

## 2015-12-28 ENCOUNTER — Encounter (HOSPITAL_COMMUNITY)
Admission: EM | Disposition: A | Discharge status: Home / Self Care | Payer: Self-pay | Source: Home / Self Care | Attending: Internal Medicine

## 2015-12-28 DIAGNOSIS — G43109 Migraine with aura, not intractable, without status migrainosus: Secondary | ICD-10-CM

## 2015-12-28 HISTORY — PX: IR GENERIC HISTORICAL: IMG1180011

## 2015-12-28 LAB — BASIC METABOLIC PANEL
Anion gap: 7 (ref 5–15)
BUN: 18 mg/dL (ref 6–20)
CHLORIDE: 104 mmol/L (ref 101–111)
CO2: 29 mmol/L (ref 22–32)
CREATININE: 0.91 mg/dL (ref 0.61–1.24)
Calcium: 8.9 mg/dL (ref 8.9–10.3)
GFR calc non Af Amer: >60 mL/min (ref >60)
Glucose, Bld: 114 mg/dL — ABNORMAL HIGH (ref 65–99)
POTASSIUM: 4.3 mmol/L (ref 3.5–5.1)
SODIUM: 140 mmol/L (ref 135–145)

## 2015-12-28 LAB — VAS US CAROTID
LCCAPDIAS: 19 cm/s
LEFT ECA DIAS: -18 cm/s
LEFT VERTEBRAL DIAS: -18 cm/s
LICAPDIAS: -26 cm/s
Left CCA dist dias: 21 cm/s
Left CCA dist sys: 96 cm/s
Left CCA prox sys: 124 cm/s
Left ICA dist dias: -27 cm/s
Left ICA dist sys: -83 cm/s
Left ICA prox sys: -86 cm/s
RCCADSYS: -58 cm/s
RCCAPDIAS: 19 cm/s
RIGHT ECA DIAS: -20 cm/s
RIGHT VERTEBRAL DIAS: -8 cm/s
Right CCA prox sys: 112 cm/s

## 2015-12-28 LAB — PROTIME-INR
INR: 1.02
Prothrombin Time: 13.4 seconds (ref 11.4–15.2)

## 2015-12-28 LAB — PLATELET INHIBITION P2Y12: Platelet Function  P2Y12: 182 [PRU] — ABNORMAL LOW (ref 194–418)

## 2015-12-28 LAB — CBC
HCT: 42.5 % (ref 39.0–52.0)
HEMOGLOBIN: 13.7 g/dL (ref 13.0–17.0)
MCH: 29 pg (ref 26.0–34.0)
MCHC: 32.2 g/dL (ref 30.0–36.0)
MCV: 89.9 fL (ref 78.0–100.0)
Platelets: 274 10*3/uL (ref 150–400)
RBC: 4.73 MIL/uL (ref 4.22–5.81)
RDW: 13.5 % (ref 11.5–15.5)
WBC: 8.9 10*3/uL (ref 4.0–10.5)

## 2015-12-28 LAB — LIPID PANEL
CHOL/HDL RATIO: 5.6 ratio
CHOLESTEROL: 200 mg/dL (ref 0–200)
HDL: 36 mg/dL — ABNORMAL LOW (ref >40)
LDL Cholesterol: 140 mg/dL — ABNORMAL HIGH (ref 0–99)
TRIGLYCERIDES: 121 mg/dL (ref <150)
VLDL: 24 mg/dL (ref 0–40)

## 2015-12-28 LAB — GLUCOSE, CAPILLARY
GLUCOSE-CAPILLARY: 182 mg/dL — AB (ref 65–99)
GLUCOSE-CAPILLARY: 119 mg/dL — AB (ref 65–99)
Glucose-Capillary: 87 mg/dL (ref 65–99)
Glucose-Capillary: 97 mg/dL (ref 65–99)

## 2015-12-28 LAB — VALPROIC ACID LEVEL: Valproic Acid Lvl: <10 ug/mL — ABNORMAL LOW (ref 50.0–100.0)

## 2015-12-28 LAB — APTT: aPTT: 25 seconds (ref 24–36)

## 2015-12-28 SURGERY — ECHOCARDIOGRAM, TRANSESOPHAGEAL
Anesthesia: Moderate Sedation

## 2015-12-28 MED ORDER — IOPAMIDOL (ISOVUE-300) INJECTION 61%
INTRAVENOUS | Status: AC
  Administered 2015-12-28: 15 mL
  Filled 2015-12-28: qty 50

## 2015-12-28 MED ORDER — SODIUM CHLORIDE 0.9 % IV SOLN
INTRAVENOUS | Status: AC
  Administered 2015-12-28: 18:00:00 via INTRAVENOUS

## 2015-12-28 MED ORDER — FENTANYL CITRATE (PF) 100 MCG/2ML IJ SOLN
INTRAMUSCULAR | Status: AC | PRN
  Administered 2015-12-28: 25 ug via INTRAVENOUS
  Administered 2015-12-28: 50 ug via INTRAVENOUS

## 2015-12-28 MED ORDER — LIDOCAINE HCL 1 % IJ SOLN
INTRAMUSCULAR | Status: AC
  Filled 2015-12-28: qty 20

## 2015-12-28 MED ORDER — IOPAMIDOL (ISOVUE-300) INJECTION 61%
INTRAVENOUS | Status: AC
  Administered 2015-12-28: 70 mL
  Filled 2015-12-28: qty 150

## 2015-12-28 MED ORDER — MIDAZOLAM HCL 2 MG/2ML IJ SOLN
INTRAMUSCULAR | Status: AC
  Filled 2015-12-28: qty 2

## 2015-12-28 MED ORDER — MIDAZOLAM HCL 2 MG/2ML IJ SOLN
INTRAMUSCULAR | Status: AC | PRN
  Administered 2015-12-28: 2 mg via INTRAVENOUS

## 2015-12-28 MED ORDER — HEPARIN SODIUM (PORCINE) 1000 UNIT/ML IJ SOLN
INTRAMUSCULAR | Status: AC
  Filled 2015-12-28: qty 1

## 2015-12-28 MED ORDER — LIDOCAINE HCL 1 % IJ SOLN
INTRAMUSCULAR | Status: AC | PRN
  Administered 2015-12-28: 20 mL

## 2015-12-28 MED ORDER — BUTALBITAL-APAP-CAFFEINE 50-325-40 MG PO TABS: 1.0000 | ORAL_TABLET | Freq: Every day | ORAL | Status: DC | PRN

## 2015-12-28 MED ORDER — SODIUM CHLORIDE 0.9 % IV SOLN
INTRAVENOUS | Status: DC
  Administered 2015-12-28: 12:00:00 via INTRAVENOUS

## 2015-12-28 MED ORDER — ATORVASTATIN CALCIUM 40 MG PO TABS
40.0000 mg | ORAL_TABLET | Freq: Every day | ORAL | Status: DC
  Administered 2015-12-28 – 2015-12-30 (×3): 40 mg via ORAL
  Filled 2015-12-28 (×3): qty 1

## 2015-12-28 MED ORDER — FENTANYL CITRATE (PF) 100 MCG/2ML IJ SOLN
INTRAMUSCULAR | Status: AC
  Filled 2015-12-28: qty 2

## 2015-12-28 MED ORDER — HEPARIN SODIUM (PORCINE) 1000 UNIT/ML IJ SOLN
INTRAMUSCULAR | Status: AC | PRN
  Administered 2015-12-28: 500 [IU] via INTRAVENOUS
  Administered 2015-12-28: 1000 [IU] via INTRAVENOUS

## 2015-12-28 NOTE — Progress Notes (Signed)
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   Subjective: The pt has no complaints at this time.  He states his R sided numbness has fully resolved.  He denies HA, focal weakness, confusion, cp, sob, n/v, or abdom pain.    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 concern for re-stenosis or residual aneurysm - Neuro following - to have formal angiogram today - TEE on hold for now until angiogram results available   Right scalp, face and arm numbness - ?Complex migraine Does not match distribution of small acute CVA noted on MRI - ?migraine sx - Neurology following - cont depakote  HTN BP currently reasonably controlled  HLD Cont lipitor   Hyperglycemia  Serum glucose elevated - A1c 6.1 in April - recheck now   DVT prophylaxis: SCDs Code Status: FULL CODE Family Communication: no family present at time of exam  Disposition Plan: awaiting further neuro w/u   Consultants:  Neurology IR   Procedures: 7/31 carotid dopplers - no evidence of stenosis B  Antimicrobials:  none  Objective: Blood pressure 134/71, pulse 72, temperature 97.9 F (36.6 C), temperature source Oral, resp. rate 18, height 5\' 8"  (1.727 m), weight 114.9 kg (253 lb 6.4 oz), SpO2 97 %.  Intake/Output Summary (Last 24 hours) at 12/28/15 1118 Last data filed at 12/27/15 1700  Gross per 24 hour  Intake              700 ml  Output                0 ml  Net              700 ml   Filed Weights   12/24/15 1341  12/25/15 0800  Weight: 112.5 kg (248 lb) 114.9 kg (253 lb 6.4 oz)    Examination: General: No acute respiratory distress Lungs: Clear to auscultation bilaterally without wheezes or crackles Cardiovascular: Regular rate and rhythm without murmur gallop or rub normal S1 and S2 Abdomen: Nontender, nondistended, soft, bowel sounds positive, no rebound, no ascites, no appreciable mass Extremities: No significant cyanosis, clubbing, or edema bilateral lower extremities  CBC:  Recent Labs Lab 12/24/15 1348 12/28/15 0621  WBC 11.7* 8.9  NEUTROABS 7.8*  --   HGB 14.4 13.7  HCT 42.2 42.5  MCV 88.1 89.9  PLT 310 123456   Basic Metabolic Panel:  Recent Labs Lab 12/24/15 1348 12/28/15 0621  NA 138 140  K 3.3* 4.3  CL 104 104  CO2 26 29  GLUCOSE 142* 114*  BUN 20 18  CREATININE 1.14 0.91  CALCIUM 9.1 8.9   GFR: Estimated Creatinine Clearance: 104.9 mL/min (by C-G formula based on SCr of 0.91 mg/dL).  Liver Function Tests:  Recent Labs Lab 12/24/15 1348  AST 20  ALT 27  ALKPHOS 78  BILITOT 0.7  PROT 6.7  ALBUMIN 3.6   Coagulation Profile:  Recent Labs Lab 12/28/15 0705  INR 1.02   HbA1C: Hgb A1c MFr  Bld  Date/Time Value Ref Range Status  09/22/2015 04:05 AM 6.1 (H) 4.8 - 5.6 % Final    Comment:    (NOTE)         Pre-diabetes: 5.7 - 6.4         Diabetes: >6.4         Glycemic control for adults with diabetes: <7.0   11/19/2009 05:15 PM (H) <5.7 % Final   5.8 (NOTE)                                                                       According to the ADA Clinical Practice Recommendations for 2011, when HbA1c is used as a screening test:   >=6.5%   Diagnostic of Diabetes Mellitus           (if abnormal result  is confirmed)  5.7-6.4%   Increased risk of developing Diabetes Mellitus  References:Diagnosis and Classification of Diabetes Mellitus,Diabetes [id number] 1):S62-S69 and Standards of Medical Care in         Diabetes - 2011,Diabetes Care,2011,34    (Suppl 1):S11-S61.    CBG:  Recent Labs Lab 12/27/15 1153 12/27/15 1625 12/27/15 2113 12/28/15 0627 12/28/15 1103  GLUCAP 120* 180* 182* 119* 87    Recent Results (from the past 240 hour(s))  Culture, blood (Routine X 2) w Reflex to ID Panel     Status: None (Preliminary result)   Collection Time: 12/24/15  6:26 PM  Result Value Ref Range Status   Specimen Description BLOOD RIGHT ANTECUBITAL  Final   Special Requests BOTTLES DRAWN AEROBIC AND ANAEROBIC 5CC  Final   Culture NO GROWTH 3 DAYS  Final   Report Status PENDING  Incomplete  Culture, blood (Routine X 2) w Reflex to ID Panel     Status: None (Preliminary result)   Collection Time: 12/24/15  6:34 PM  Result Value Ref Range Status   Specimen Description BLOOD LEFT ANTECUBITAL  Final   Special Requests BOTTLES DRAWN AEROBIC AND ANAEROBIC 5CC  Final   Culture NO GROWTH 3 DAYS  Final   Report Status PENDING  Incomplete     Scheduled Meds: . aspirin  325 mg Oral Daily  . atorvastatin  10 mg Oral q1800  . clopidogrel  75 mg Oral Daily  . escitalopram  20 mg Oral Daily  . insulin aspart  0-9 Units Subcutaneous TID WC  . lisinopril  40 mg Oral Daily  . metoprolol  25 mg Oral BID     LOS: 3 days   Cherene Altes, MD [facility] Office  [phone] Pager - Text Page per Amion as per below:  On-Call/Text Page:      [email or web address]      password [email or web address]  If 7PM-7AM, please contact night-coverage [email or web address] Password [email or web address] 12/28/2015, 11:18 AM

## 2015-12-28 NOTE — Progress Notes (Signed)
STROKE TEAM PROGRESS NOTE   SUBJECTIVE (INTERVAL HISTORY) No family at bedside. Patient up in the chair. Awaiting procedure today - he is unsure of the time. "they told me to be ready". Will do cerebral angio first and consider TEE if needed.   OBJECTIVE Temp:  [97.7 F (36.5 C)-98.7 F (37.1 C)] 97.9 F (36.6 C) (08/01 0945) Pulse Rate:  [65-74] 72 (08/01 0945) Cardiac Rhythm: Normal sinus rhythm (08/01 0700) Resp:  [18] 18 (08/01 0945) BP: (100-135)/(48-77) 134/71 (08/01 0945) SpO2:  [94 %-100 %] 97 % (08/01 0945)  CBC:   Recent Labs Lab 12/24/15 1348 12/28/15 0621  WBC 11.7* 8.9  NEUTROABS 7.8*  --   HGB 14.4 13.7  HCT 42.2 42.5  MCV 88.1 89.9  PLT 310 123456    Basic Metabolic Panel:   Recent Labs Lab 12/24/15 1348 12/28/15 0621  NA 138 140  K 3.3* 4.3  CL 104 104  CO2 26 29  GLUCOSE 142* 114*  BUN 20 18  CREATININE 1.14 0.91  CALCIUM 9.1 8.9    Lipid Panel:     Component Value Date/Time   CHOL 200 12/28/2015 0613   TRIG 121 12/28/2015 0613   HDL 36 (L) 12/28/2015 0613   CHOLHDL 5.6 12/28/2015 0613   VLDL 24 12/28/2015 0613   LDLCALC 140 (H) 12/28/2015 0613   HgbA1c:  Lab Results  Component Value Date   HGBA1C 6.1 (H) 09/22/2015   Urine Drug Screen:     Component Value Date/Time   LABOPIA NONE DETECTED 12/25/2015 2115   COCAINSCRNUR NONE DETECTED 12/25/2015 2115   COCAINSCRNUR NEG 06/07/2006 0039   LABBENZ NONE DETECTED 12/25/2015 2115   LABBENZ NEG 06/07/2006 0039   AMPHETMU NONE DETECTED 12/25/2015 2115   THCU NONE DETECTED 12/25/2015 2115   LABBARB NONE DETECTED 12/25/2015 2115      IMAGING  Dr. Erlinda Hong has personally reviewed the radiological images below and agree with the radiology interpretations.  Dg Chest 2 View 12/24/2015 No active cardiopulmonary disease.   Ct Head Wo Contrast 12/24/2015 No acute abnormality. Chronic microvascular ischemic change. 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   Mr Brain Wo  Contrast 12/25/2015 1. Punctate 5 mm acute ischemic cortical infarct within the high right frontal lobe. No associated hemorrhage.  2. Subtle linear diffusion abnormality within the deep/subcortical white matter of the anterior right frontal lobe, most consistent with late subacute to chronic ischemic changes. This is consistent with progressive small vessel white matter disease, new relative to most recent MRI from 09/21/2015.  3. Pipeline stent in place left anterior cerebral artery. 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   2D Echocardiogram  - Left ventricle: The cavity size was normal. Systolic function was normal. The estimated ejection fraction was in the range of 60% to 65%. Wall motion was normal; there were no regional wall motion abnormalities. Left ventricular diastolic function parameters were normal. - Mitral valve: There was no regurgitation. - Right ventricle: The cavity size was normal. Wall thickness was normal. Systolic function was normal. - Atrial septum: No defect or patent foramen ovale was identified by color flow Doppler. - Tricuspid valve: There was no regurgitation. - Inferior vena cava: The vessel was normal in size. The respirophasic diameter changes were in the normal range (>= 50%), consistent with normal central venous pressure.  Carotid Doppler   There is 1-39% bilateral ICA stenosis. Vertebral artery flow is antegrade.    Cerebral angio - pending   PHYSICAL EXAM  HEENT-  Normocephalic, no lesions, without obvious abnormality.  Normal external eye and conjunctiva.   Cardiovascular- S1, S2 normal, pulses palpable throughout   Lungs- chest clear, no wheezing, rales, normal symmetric air entry Abdomen- normal findings: bowel sounds normal Extremities- no edema Lymph-no adenopathy palpable Musculoskeletal-no joint tenderness, deformity or swelling.  No left knee pain  Neurological Examination Mental Status: Alert, oriented, thought  content appropriate.  Speech fluent without evidence of aphasia.  Able to follow 3 step commands without difficulty. Cranial Nerves: II:  Visual fields grossly normal, pupils equal, round, reactive to light and accommodation III,IV, VI: ptosis not present, extra-ocular motions intact bilaterally V,VII: smile symmetric, facial light touch sensation normal bilaterally VIII: hearing significantly decreased on the left and also decreased on the right.  IX,X: uvula rises symmetrically XI: bilateral shoulder shrug XII: midline tongue extension Motor: Right :            Upper extremity   5/5                                                                              Left:     Upper extremity   5/5                       Lower extremity   5/5                                                                                           Lower extremity   5/5 Tone and bulk:normal tone throughout; no atrophy noted Sensory: Light touch intact throughout, bilaterally Cerebellar: normal finger-to-nose; left hand tremulous but no pass pointing Gait: normal   ASSESSMENT/PLAN Mr. DAVIS MIKITA is a 60 y.o. 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 He did not receive IV t-PA due to improvement in deficits.  Stroke:  Incidental non-dominant R frontal infarcts in setting of recent R ACA pipeline stent placement, concern for re-stenosis or residue aneurysm  Resultant  deficits resolved  MRI - Punctate 5 mm acute ischemic cortical infarct within the high right frontal lobe  MRA - not performed  Carotid Doppler No significant stenosis   2D Echo EF 60-65%. No source of embolus   Cerebral angiogram planned for today  Postpone TEE pending cerebral angio findings   LDL - 140  HgbA1c - pending  VTE prophylaxis -  SCDs Diet NPO time specified Except for: Sips with Meds  aspirin 325 mg daily and clopidogrel 75 mg daily prior to admission, now on aspirin 325 mg daily and clopidogrel 75 mg daily. P2Y12 level 182, may need plavix load, will discuss with Dr. Estanislado Pandy.  Patient counseled to  be compliant with his antithrombotic medications  Ongoing aggressive stroke risk factor management  Therapy recommendations: Pending  Disposition: Pending  Right scalp, face and arm numbness - ? Complicated migraine  Not fit to the stroke this time  Likely due to complicated migraine equivalent  Prescribed depakote last admission  Pt not taking depakote at home  depakote level was low  Will consider to re-start depakote  Hypertension BP much better in 130s. Discontinue norvasc, and cut metoprolol in half, continue lisinopril Close monitoring BP goal normotensive  Hyperlipidemia  On home lipitor 10  LDL 140, goal < 70  Increase lipitor to 40mg   Continue lipitor on discharge  Other Stroke Risk Factors  Advanced age  Obesity, Body mass index is 38.53 kg/m., recommend weight loss, diet and exercise as appropriate   Hx stroke/TIA  Family hx stroke (mother)  Migraines  Other Active Problems  Mild hypokalemia  Mild leukocytosis  Hospital day # 3  Rosalin Hawking, MD PhD Stroke Neurology 12/28/2015 12:44 PM    To contact Stroke Continuity provider, please refer to [email or web address] After hours, contact General Neurology

## 2015-12-28 NOTE — Sedation Documentation (Signed)
Patient denies pain and is resting comfortably.  

## 2015-12-28 NOTE — Procedures (Signed)
S/P 4 vessel cerebra;l arteriogram. RT CFA approach. Findings. 1.completely obliterated Rt pericallosal aneurysm

## 2015-12-28 NOTE — Progress Notes (Signed)
Pt in bed at this time gauze dressing to right groin dry and intact. Pedal pulses present. Pt denies pain or discomfort. Will continue to monitor.

## 2015-12-28 NOTE — Progress Notes (Signed)
Pt returned from procedure at this time stable, with no noted distress. Gauze dressing  to right groin dry and intact. Pulses present. Pt denies pain or discomfort to site. Pt head of the bed flat. Will continue to monitor.

## 2015-12-28 NOTE — Sedation Documentation (Signed)
Patient is resting comfortably. 

## 2015-12-28 NOTE — Consult Note (Signed)
Chief Complaint: Patient was seen in consultation today for cerebral arteriogram Chief Complaint  Patient presents with  . Numbness   at the request of Dr Rosalin Hawking  Referring Physician(s): Dr Rosalin Hawking  Supervising Physician: Luanne Bras  Patient Status: Inpatient  History of Present Illness: [name] is a 60 y.o. 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 Punctate 5 mm acute ischemic cortical infarct within the high right frontal lobe. No associated hemorrhage. 2. Subtle linear diffusion abnormality within the deep/subcortical white matter of the anterior right frontal lobe, most consistent with late subacute to chronic ischemic changes. This is consistent with progressive small vessel white matter disease, new relative to most recent MRI from 09/21/2015. 3. Pipeline stent in place left anterior cerebral artery. 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 Allergy   . Anxiety   . Cataract   . Cerebral hemorrhage ([place])   . Chest pain, atypical    12/2003:  negative cardiolyte  . Depression   . Eczema   . Erectile dysfunction   . History of meniscal tear    bilateral  . HLD (hyperlipidemia)   . HTN (hypertension)   . Hx of tear of ACL (anterior cruciate ligament)    right  . Insomnia   . Olecranon bursitis of left elbow 10/2009   s/p I&D by Dr Maxie Better, initially assessed by Dr. Nori Riis   . Prediabetes   . Stroke ([place])   . TIA (transient ischemic attack)     Past Surgical History:  Procedure Laterality Date  . INCISE AND  DRAIN ABCESS     L elbow due to cellulitis/bursitis  . INNER EAR SURGERY    . KNEE ARTHROSCOPY    . RADIOLOGY WITH ANESTHESIA N/A 09/20/2015   Procedure: EMBOLIZATION         (RADIOLOGY WITH ANESTHESIA);  Surgeon: Luanne Bras, MD;  Location: [facility];  Service: Radiology;  Laterality: N/A;  . SKIN TAG REMOVAL     11 removed    Allergies: Review of patient's allergies indicates no known allergies.  Medications: Prior to Admission medications   Medication Sig Start Date End Date Taking? Authorizing Provider  aspirin EC 325 MG EC tablet Take 1 tablet (325 mg total) by mouth daily. 09/17/15  Yes Shanker Kristeen Mans, MD  atorvastatin (LIPITOR) 10 MG tablet Take 1 tablet (10 mg total) by mouth daily at 6 PM. Patient taking differently: Take 10 mg by mouth daily.  09/30/15 09/29/16 Yes Daniel J Angiulli, PA-C  butalbital-acetaminophen-caffeine (FIORICET, ESGIC) 50-325-40 MG tablet 1-2 tablets as needed for headache.   No more than 8 per week Patient taking differently: Take 1-2 tablets by mouth daily as needed for headache. No more than 8 per week 12/16/15  Yes Britt Bottom, MD  chlorthalidone (HYGROTON) 25 MG tablet Take 1 tablet (25 mg total) by mouth daily. 09/30/15  Yes Daniel J Angiulli, PA-C  clopidogrel (PLAVIX) 75 MG tablet Take 1 tablet (75 mg total) by mouth daily. 09/30/15  Yes Daniel J Angiulli, PA-C  escitalopram (LEXAPRO) 20 MG tablet Take 1 tablet (20 mg  total) by mouth daily. 12/16/15  Yes Britt Bottom, MD  fluticasone (FLONASE) 50 MCG/ACT nasal spray Place 2 sprays into the nose daily. Patient taking differently: Place 2 sprays into the nose daily as needed for allergies.  09/07/12  Yes Cleatrice Burke, PA-C  lisinopril (PRINIVIL,ZESTRIL) 20 MG tablet Take 40 mg by mouth daily. 11/21/15  Yes Historical Provider, MD  metoprolol (LOPRESSOR) 50 MG tablet Take 1 tablet (50 mg total) by mouth 2 (two) times daily. 09/30/15  Yes Daniel J Angiulli, PA-C  triamcinolone ointment (KENALOG) 0.1 %  Apply 1 application topically See admin instructions. Apply twice daily to affected area for 1 week, then apply twice daily as needed for irritation/rash 10/07/15  Yes Historical Provider, MD  amLODipine (NORVASC) 10 MG tablet Take 1 tablet (10 mg total) by mouth daily. 09/30/15   Lavon Paganini Angiulli, PA-C  Elastic Bandages & Supports (WRIST SPLINT LEFT/RIGHT) MISC Bilateral cock-up wrist splints for carpal tunnel 12/16/15   Britt Bottom, MD     Family History  Problem Relation Age of Onset  . Stroke Mother   . Hypertension Mother   . Aneurysm Mother 24    Died of brain aneursym  . Heart failure Father   . Emphysema Father   . Diabetes Mellitus II Sister   . Colon cancer Neg Hx   . Rectal cancer Neg Hx   . Stomach cancer Neg Hx     Social History   Social History  . Marital status: Divorced    Spouse name: N/A  . Number of children: N/A  . Years of education: N/A   Social History Main Topics  . Smoking status: Never Smoker  . Smokeless tobacco: Never Used  . Alcohol use No  . Drug use: No  . Sexual activity: No   Other Topics Concern  . None   Social History Narrative   Separated, has 2 daughters, works as a Forensic psychologist, Therapist, occupational   Works at [employer], distribution center. 10-15-15     Review of Systems: A 12 point ROS discussed and pertinent positives are indicated in the HPI above.  All other systems are negative.  Review of Systems  Constitutional: Negative for activity change, appetite change, diaphoresis, fatigue and fever.  HENT: Negative for tinnitus, trouble swallowing and voice change.   Eyes: Negative for visual disturbance.  Respiratory: Negative for chest tightness.   Gastrointestinal: Negative for abdominal pain.  Neurological: Positive for numbness and headaches. Negative for dizziness, tremors, seizures, syncope, facial asymmetry, speech difficulty, weakness and light-headedness.  Psychiatric/Behavioral: Negative for behavioral problems  and confusion.    Vital Signs: BP 135/77 (BP Location: Right Arm)   Pulse 65   Temp 98.4 F (36.9 C) (Oral)   Resp 18   Ht 5\' 8"  (1.727 m)   Wt 253 lb 6.4 oz (114.9 kg)   SpO2 98%   BMI 38.53 kg/m   Physical Exam  Constitutional: He is oriented to person, place, and time. He appears well-nourished.  Cardiovascular: Normal rate, regular rhythm and normal heart sounds.   Pulmonary/Chest: Effort normal. He has no wheezes.  Abdominal: Soft. Bowel sounds are normal. There is no tenderness.  Musculoskeletal: Normal range of motion. He exhibits no edema.  Neurological: He is alert and oriented to person, place, and time.  Skin: Skin is warm and dry.  Psychiatric: He has a normal mood and affect. His behavior is normal. Judgment and thought content normal.  Nursing note and vitals reviewed.  Mallampati Score:  MD Evaluation Airway: WNL Heart: WNL Abdomen: WNL Chest/ Lungs: WNL ASA  Classification: 3 Mallampati/Airway Score: One  Imaging: Dg Chest 2 View  Result Date: 12/24/2015 CLINICAL DATA:  Right facial numbness. EXAM: CHEST  2 VIEW COMPARISON:  Radiographs of September 15, 2015. FINDINGS: The heart size and mediastinal contours are within normal limits. Both lungs are clear. No pneumothorax or pleural effusion is noted. The visualized skeletal structures are unremarkable. IMPRESSION: No active cardiopulmonary disease. Electronically Signed   By: [name], M.D.   On: 12/24/2015 15:22  Ct Head Wo Contrast  Result Date: 12/24/2015 CLINICAL DATA:  Facial numbness beginning at 12 noon today. EXAM: CT HEAD WITHOUT CONTRAST TECHNIQUE: Contiguous axial images were obtained from the base of the skull through the vertex without intravenous contrast. COMPARISON:  Brain MRI 09/21/2015.  Head CT scan 09/20/2015. 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 There is some chronic microvascular ischemic change. No evidence of acute  intracranial abnormality including hemorrhage, infarct, mass lesion, mass effect, midline shift or abnormal extra-axial fluid collection is seen. Mucous retention cysts or polyps in the maxillary sinuses are identified. Mild ethmoid air cell disease is noted. The calvarium is intact. IMPRESSION: No acute abnormality. Chronic microvascular ischemic change. 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 Electronically Signed   By: Inge Rise M.D.   On: 12/24/2015 14:33  Mr Brain Wo Contrast  Result Date: 12/25/2015 CLINICAL DATA:  Initial evaluation for acute right-sided weakness. EXAM: MRI HEAD WITHOUT CONTRAST TECHNIQUE: Multiplanar, multiecho pulse sequences of the brain and surrounding structures were obtained without intravenous contrast. COMPARISON:  Prior CT from earlier the same day as well as previous MRI from 09/21/2015. FINDINGS: Exam mildly degraded by motion artifact. Cerebral volume is stable from previous studies, and remains within normal limits for patient age. Mild scattered patchy T2/FLAIR hyperintensity within the periventricular white matter, most likely related to chronic small vessel ischemic disease. Mild chronic microvascular ischemic changes also noted within the pons. 5 mm focus of restricted diffusion present within the cortical gray matter of the anterior right frontal lobe near the vertex, consistent with a small acute ischemic cortical infarct (series 4, image 46). No associated hemorrhage or mass effect. This is right ACA territory. There is subtly increased linear diffusion abnormality within the deep/ subcortical white matter of the anterior right frontal lobe (series 4, image 36). No associated signal loss on ADC map. This demonstrates associated increased T2/FLAIR signal abnormality (series 7, image 18). This is new from prior, and likely reflects the sequela of late subacute to chronic white matter/small vessel ischemia. No other acute infarct. Gray-white  matter differentiation otherwise maintained. Major intracranial vascular flow voids are preserved. Susceptibility artifact from pipeline device within the left ACA noted. 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 No acute intracranial hemorrhage seen on prior CT. No mass lesion, midline shift, or mass effect. No hydrocephalus. No extra-axial fluid collection. Major dural sinuses are grossly patent. Craniocervical junction normal. Visualized upper cervical spine unremarkable. Pituitary gland normal. No acute abnormality about the globes and orbits. Axial myopia noted. Scattered opacity mucosal thickening within the ethmoidal air cells and maxillary sinuses. Retention cyst noted within the right maxilla sinus. Small amount of layering opacity within the left sphenoid sinus. No mastoid effusion. Inner ear structures grossly normal. Bone marrow signal intensity within normal limits. No scalp soft tissue abnormality. IMPRESSION: 1.  Punctate 5 mm acute ischemic cortical infarct within the high right frontal lobe. No associated hemorrhage. 2. Subtle linear diffusion abnormality within the deep/subcortical white matter of the anterior right frontal lobe, most consistent with late subacute to chronic ischemic changes. This is consistent with progressive small vessel white matter disease, new relative to most recent MRI from 09/21/2015. 3. Pipeline stent in place left anterior cerebral artery. 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 Electronically Signed   By: Jeannine Boga M.D.   On: 12/25/2015 00:04   Labs:  CBC:  Recent Labs  09/22/15 0354 09/24/15 0705 12/24/15 1348 12/28/15 0621  WBC 9.5 10.0 11.7* 8.9  HGB 12.1* 13.5 14.4 13.7  HCT 37.5* 40.7 42.2 42.5  PLT 274 317 310 274    COAGS:  Recent Labs  09/14/15 2049 09/20/15 0905 12/28/15 0705  INR 1.10 0.99 1.02  APTT 25 26 25     BMP:  Recent Labs   09/22/15 0354 09/24/15 0705 12/24/15 1348 12/28/15 0621  NA 140 139 138 140  K 3.8 3.5 3.3* 4.3  CL 105 101 104 104  CO2 27 25 26 29   GLUCOSE 89 83 142* 114*  BUN 8 15 20 18   CALCIUM 8.3* 8.9 9.1 8.9  CREATININE 0.88 1.02 1.14 0.91  GFRNONAA >60 >60 >60 >60  GFRAA >60 >60 >60 >60    LIVER FUNCTION TESTS:  Recent Labs  09/14/15 2049 09/24/15 0705 12/24/15 1348  BILITOT 0.7 0.7 0.7  AST 21 21 20   ALT 26 36 27  ALKPHOS 74 66 78  PROT 7.6 6.6 6.7  ALBUMIN 3.9 3.3* 3.6    TUMOR MARKERS: No results for input(s): AFPTM, CEA, CA199, CHROMGRNA in the last 8760 hours.  Assessment and Plan:  New CVA Hx R ACA/bilobed Rt pericallosal aneurysm pipeline stent placement 09/20/15 Now scheduled for cerebral arteriogram Risks and Benefits discussed with the patient including, but not limited to bleeding, infection, vascular injury, contrast induced renal failure, stroke or even death. All of the patient's questions were answered, patient is agreeable to proceed. Consent signed and in chart.  Thank you for this interesting consult.  I greatly enjoyed meeting OBET BYFORD and look forward to participating in their care.  A copy of this report was sent to the requesting provider on this date.  Electronically Signed: Kinberly Perris A 12/28/2015, 8:19 AM   I spent a total of 40 Minutes    in face to face in clinical consultation, greater than 50% of which was counseling/coordinating care for cerebral arteriogram

## 2015-12-29 ENCOUNTER — Encounter (HOSPITAL_COMMUNITY): Payer: Self-pay | Admitting: *Deleted

## 2015-12-29 LAB — GLUCOSE, CAPILLARY
GLUCOSE-CAPILLARY: 141 mg/dL — AB (ref 65–99)
GLUCOSE-CAPILLARY: 110 mg/dL — AB (ref 65–99)
Glucose-Capillary: 103 mg/dL — ABNORMAL HIGH (ref 65–99)
Glucose-Capillary: 116 mg/dL — ABNORMAL HIGH (ref 65–99)
Glucose-Capillary: 143 mg/dL — ABNORMAL HIGH (ref 65–99)

## 2015-12-29 LAB — CULTURE, BLOOD (ROUTINE X 2)
Culture: NO GROWTH
Culture: NO GROWTH

## 2015-12-29 LAB — HEMOGLOBIN A1C
Hgb A1c MFr Bld: 6.2 % — ABNORMAL HIGH (ref 4.8–5.6)
Mean Plasma Glucose: 131 mg/dL

## 2015-12-29 MED ORDER — CLOPIDOGREL BISULFATE 75 MG PO TABS
225.0000 mg | ORAL_TABLET | Freq: Once | ORAL | Status: AC
  Administered 2015-12-29: 225 mg via ORAL
  Filled 2015-12-29: qty 3

## 2015-12-29 NOTE — Progress Notes (Signed)
STROKE TEAM PROGRESS NOTE   SUBJECTIVE (INTERVAL HISTORY) No family at bedside. Patient up in the chair, talking on the phone. Dr. Erlinda Hong discussed planned testing (TEE, lab studies) and possible Loop with pt. Questions answered. P2Y12 not at goal, will need plavix load and recheck in am.    OBJECTIVE Temp:  [97.6 F (36.4 C)-98.2 F (36.8 C)] 98.1 F (36.7 C) (08/02 0900) Pulse Rate:  [64-87] 73 (08/02 0900) Cardiac Rhythm: Normal sinus rhythm (08/02 0700) Resp:  [15-21] 18 (08/02 0900) BP: (97-159)/(44-87) 129/60 (08/02 0900) SpO2:  [94 %-100 %] 97 % (08/02 0900)  CBC:   Recent Labs Lab 12/24/15 1348 12/28/15 0621  WBC 11.7* 8.9  NEUTROABS 7.8*  --   HGB 14.4 13.7  HCT 42.2 42.5  MCV 88.1 89.9  PLT 310 [id number]    Basic Metabolic Panel:   Recent Labs Lab 12/24/15 1348 12/28/15 0621  NA 138 140  K 3.3* 4.3  CL 104 104  CO2 26 29  GLUCOSE 142* 114*  BUN 20 18  CREATININE 1.14 0.91  CALCIUM 9.1 8.9    Lipid Panel:     Component Value Date/Time   CHOL 200 12/28/2015 0613   TRIG 121 12/28/2015 0613   HDL 36 (L) 12/28/2015 0613   CHOLHDL 5.6 12/28/2015 0613   VLDL 24 12/28/2015 0613   LDLCALC 140 (H) 12/28/2015 0613   HgbA1c:  Lab Results  Component Value Date   HGBA1C 6.2 (H) 12/28/2015   Urine Drug Screen:     Component Value Date/Time   LABOPIA NONE DETECTED 12/25/2015 2115   COCAINSCRNUR NONE DETECTED 12/25/2015 2115   COCAINSCRNUR NEG 06/07/2006 0039   LABBENZ NONE DETECTED 12/25/2015 2115   LABBENZ NEG 06/07/2006 0039   AMPHETMU NONE DETECTED 12/25/2015 2115   THCU NONE DETECTED 12/25/2015 2115   LABBARB NONE DETECTED 12/25/2015 2115      IMAGING  Dr. Erlinda Hong has personally reviewed the radiological images below and agree with the radiology interpretations.  Dg Chest 2 View 12/24/2015 No active cardiopulmonary disease.   Ct Head Wo Contrast 12/24/2015 No acute abnormality. Chronic microvascular ischemic change. No change in a pipeline device for  left pericallosal aneurysm.   Mr Brain Wo Contrast 12/25/2015 1. Punctate 5 mm acute ischemic cortical infarct within the high right frontal lobe. No associated hemorrhage.  2. Subtle linear diffusion abnormality within the deep/subcortical white matter of the anterior right frontal lobe, most consistent with late subacute to chronic ischemic changes. This is consistent with progressive small vessel white matter disease, new relative to most recent MRI from 09/21/2015.  3. Pipeline stent in place left anterior cerebral artery. 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   2D Echocardiogram  - Left ventricle: The cavity size was normal. Systolic function was normal. The estimated ejection fraction was in the range of 60% to 65%. Wall motion was normal; there were no regional wall motion abnormalities. Left ventricular diastolic function parameters were normal. - Mitral valve: There was no regurgitation. - Right ventricle: The cavity size was normal. Wall thickness was normal. Systolic function was normal. - Atrial septum: No defect or patent foramen ovale was identified by color flow Doppler. - Tricuspid valve: There was no regurgitation. - Inferior vena cava: The vessel was normal in size. The respirophasic diameter changes were in the normal range (>= 50%), consistent with normal central venous pressure.  Carotid Doppler   There is 1-39% bilateral ICA stenosis. Vertebral artery flow is antegrade.    Cerebral angio -  completely obliterated Rt pericallosal aneurysm  TEE pending    PHYSICAL EXAM HEENT-  Normocephalic, no lesions, without obvious abnormality.  Normal external eye and conjunctiva.   Cardiovascular- S1, S2 normal, pulses palpable throughout   Lungs- chest clear, no wheezing, rales, normal symmetric air entry Abdomen- normal findings: bowel sounds normal Extremities- no edema Lymph-no adenopathy palpable Musculoskeletal-no joint tenderness, deformity or  swelling.  No left knee pain  Neurological Examination Mental Status: Alert, oriented, thought content appropriate.  Speech fluent without evidence of aphasia.  Able to follow 3 step commands without difficulty. Cranial Nerves: II:  Visual fields grossly normal, pupils equal, round, reactive to light and accommodation III,IV, VI: ptosis not present, extra-ocular motions intact bilaterally V,VII: smile symmetric, facial light touch sensation normal bilaterally VIII: hearing significantly decreased on the left and also decreased on the right.  IX,X: uvula rises symmetrically XI: bilateral shoulder shrug XII: midline tongue extension Motor: Right :            Upper extremity   5/5                                                                              Left:     Upper extremity   5/5                       Lower extremity   5/5                                                                                           Lower extremity   5/5 Tone and bulk:normal tone throughout; no atrophy noted Sensory: Light touch intact throughout, bilaterally Cerebellar: normal finger-to-nose; left hand tremulous but no pass pointing Gait: normal   ASSESSMENT/PLAN [name] is a 60 y.o. 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 He did not receive IV t-PA due to improvement in deficits.  Stroke:  Incidental non-dominant R frontal infarcts in setting of recent R ACA pipeline stent placement, concern for re-stenosis or residue aneurysm  Resultant  deficits resolved  MRI - Punctate 5 mm acute ischemic cortical infarct within the high right frontal lobe  MRA - not performed  Carotid Doppler No significant stenosis   2D Echo EF 60-65%. No source of embolus   Cerebral angiogram completely obliterated Rt  pericallosal aneurysm  TEE to look for embolic source. Arranged with [facility] for tomorrow.  If positive for PFO (patent foramen ovale), check bilateral lower extremity venous dopplers to rule out DVT as possible source of stroke. (I have made patient NPO after midnight tonight).   If TEE negative, a [facility] electrophysiologist will consult and consider placement of an implantable loop recorder to evaluate for atrial fibrillation as etiology of stroke. This has been explained to patient by Dr. Erlinda Hong and he is agreeable if needed.   LDL - 140  HgbA1c - 6.2  VTE prophylaxis - SCDs Diet regular Room service appropriate? Yes; Fluid consistency: Thin  aspirin 325 mg daily and clopidogrel 75 mg daily prior to admission, now on aspirin 325 mg daily and clopidogrel 75 mg daily. P2Y12 level 182, ordered plavix load today (total of 300 mg today) and recheck P2Y12 in am. Goal is 50-150. If above this range, may need additional plavix load given risk for in-stent clot. Dr. Erlinda Hong discussed with Dr. Estanislado Pandy.  Patient counseled to be compliant with his antithrombotic medications  Ongoing aggressive stroke risk factor management  Therapy recommendations: Pending  Disposition: Pending  Right scalp, face and arm numbness - ? Complicated migraine  Not fit to the stroke this time  Likely due to complicated migraine equivalent  Prescribed depakote last admission  Pt not taking depakote at home with no recurrence of HA  depakote level was low  Hypertension BP much better in 130s. Discontinue norvasc, and cut metoprolol in half, continue lisinopril Close monitoring BP goal normotensive  Hyperlipidemia  On home lipitor 10  LDL 140, goal < 70  Increase lipitor to 40mg   Continue lipitor on discharge  Other Stroke Risk Factors  Advanced age  Obesity, Body mass index is 38.53 kg/m., recommend weight loss, diet and exercise as appropriate    Hx stroke/TIA  Family hx stroke (mother)  Migraines  Other Active Problems  Mild hypokalemia  Mild leukocytosis  Hospital day # [facility] for Pager information 12/29/2015 10:29 AM   I, the attending vascular neurologist, have personally obtained a history, examined the patient, evaluated laboratory data, individually viewed imaging studies and agree with radiology interpretations. Together with the NP/PA, we formulated the assessment and plan of care which reflects our mutual decision.  I have made any additions or clarifications directly to the above note and agree with the findings and plan as currently documented.   Cerebral angio negative for residue aneurysm or in-stent thrombosis. However, P2Y12 not at goal. In-stent micro emboli not excluded. Will give plavix load 300mg  today. Recheck in am. However, discussed with pt and he would like more detailed stroke work up with TEE and loop. Scheduled for tomorrow.   Rosalin Hawking, MD PhD Stroke Neurology 12/29/2015 3:54 PM       To contact Stroke Continuity provider, please refer to [email or web address] After hours, contact General Neurology

## 2015-12-29 NOTE — Progress Notes (Signed)
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   Assessment & Plan:   Principal Problem:   TIA (transient ischemic attack) Active Problems:   Hyperlipidemia   Hearing loss   Essential hypertension   Depression   Type 2 diabetes mellitus (HCC)   Leukocytosis   Cerebrovascular accident (CVA) due to embolism of right anterior cerebral artery (HCC)   Migraine with aura and without status migrainosus, not intractable   Status post stroke   Bilateral hearing loss   Numbness   1. R frontal 5 mm infarct. Will continue dual antiplatelet therapy with asa and clopidogrel (sp load), statin therapy with atorvastatin 40 mg daily, continue blood pressure control, avoid hypotension. Patient on sinus rhythm. Follow on TEE. Continue neuro checks and physical therapy evaluation. Carotid US with no significant stenosis. Cerebral arteriogram with progressive thrombus within known pericallosal aneurysm.   2. HTN, Blood pressure control with lisinopril and metoprolol. Systolic blood pressure 97 to 125. Will hold on lisinopril to avoid worsening hypotension.  3. Hyperglycemia. Continue insulin sliding scale for glucose monitor and coverage. Serum glucose 97-141-103-116. Patient tolerating po well, Hb A1c 6.2   DVT prophylaxis: scd Code Status: full code Family Communication: none  Disposition Plan:   Consultants:   neruology  Procedures:  Cerebral angiogram.  Antimicrobials:  Subjective: Patient feeling well, no nausea or vomiting, tolerating po well, no weakness or paresthesias, out of the bed as tolerated. No  dysbalance.   Objective: Vitals:   12/28/15 2120 12/29/15 0107 12/29/15 0519 12/29/15 0900  BP: (!) 97/44 125/65 (!) 115/55 129/60  Pulse: 71 73 64 73  Resp: 18 18 18 18   Temp: 97.9 F (36.6 C) 98 F (36.7 C) 98.1 F (36.7 C) 98.1 F (36.7 C)  TempSrc: Oral Oral Oral Oral  SpO2: 95% 98% 95% 97%  Weight:      Height:        Intake/Output Summary (Last 24 hours) at 12/29/15 0913 Last data filed at 12/29/15 0900  Gross per 24 hour  Intake             1000 ml  Output                0 ml  Net             1000 ml   Filed Weights   12/24/15 1341 12/25/15 0800  Weight: 112.5 kg (248 lb) 114.9 kg (253 lb 6.4 oz)    Examination:  General exam: not in pain or dyspnea. E ENT: no conjunctival pallor, oral mucosa moist.  Respiratory system: Clear to auscultation. Respiratory effort normal. No wheezing, rales or rhonchi.  Cardiovascular system: S1 & S2 heard, RRR. No JVD, murmurs, rubs, gallops or clicks. No pedal edema. Gastrointestinal system: Abdomen is nondistended, soft and nontender. No organomegaly or masses felt. Normal bowel sounds heard. Central nervous system: Alert and oriented. No focal neurological deficits. Extremities: Symmetric 5 x 5 power. Skin: No rashes, lesions or ulcers   Data Reviewed: I have personally reviewed following labs and imaging studies  CBC:  Recent Labs Lab 12/24/15 1348 12/28/15  [id number]  WBC 11.7* 8.9  NEUTROABS 7.8*  --   HGB 14.4 13.7  HCT 42.2 42.5  MCV 88.1 89.9  PLT 310 [id number]   Basic Metabolic Panel:  Recent Labs Lab 12/24/15 1348 12/28/15 0621  NA 138 140  K 3.3* 4.3  CL 104 104  CO2 26 29  GLUCOSE 142* 114*  BUN 20 18  CREATININE 1.14 0.91  CALCIUM 9.1 8.9   GFR: Estimated Creatinine Clearance: 104.9 mL/min (by C-G formula based on SCr of 0.91 mg/dL). Liver Function Tests:  Recent Labs Lab 12/24/15 1348  AST 20  ALT 27  ALKPHOS 78  BILITOT 0.7  PROT 6.7  ALBUMIN 3.6   No results for input(s): LIPASE, AMYLASE  in the last 168 hours. No results for input(s): AMMONIA in the last 168 hours. Coagulation Profile:  Recent Labs Lab 12/28/15 0705  INR 1.02   Cardiac Enzymes: No results for input(s): CKTOTAL, CKMB, CKMBINDEX, TROPONINI in the last 168 hours. BNP (last 3 results) No results for input(s): PROBNP in the last 8760 hours. HbA1C:  Recent Labs  12/28/15 0621  HGBA1C 6.2*   CBG:  Recent Labs Lab 12/28/15 0627 12/28/15 1103 12/28/15 1602 12/28/15 2023 12/29/15 0621  GLUCAP 119* 87 97 141* 103*   Lipid Profile:  Recent Labs  12/28/15 0613  CHOL 200  HDL 36*  LDLCALC 140*  TRIG 121  CHOLHDL 5.6   Thyroid Function Tests: No results for input(s): TSH, T4TOTAL, FREET4, T3FREE, THYROIDAB in the last 72 hours. Anemia Panel: No results for input(s): VITAMINB12, FOLATE, FERRITIN, TIBC, IRON, RETICCTPCT in the last 72 hours. Sepsis Labs: No results for input(s): PROCALCITON, LATICACIDVEN in the last 168 hours.  Recent Results (from the past 240 hour(s))  Culture, blood (Routine X 2) w Reflex to ID Panel     Status: None (Preliminary result)   Collection Time: 12/24/15  6:26 PM  Result Value Ref Range Status   Specimen Description BLOOD RIGHT ANTECUBITAL  Final   Special Requests BOTTLES DRAWN AEROBIC AND ANAEROBIC 5CC  Final   Culture NO GROWTH 4 DAYS  Final   Report Status PENDING  Incomplete  Culture, blood (Routine X 2) w Reflex to ID Panel     Status: None (Preliminary result)   Collection Time: 12/24/15  6:34 PM  Result Value Ref Range Status   Specimen Description BLOOD LEFT ANTECUBITAL  Final   Special Requests BOTTLES DRAWN AEROBIC AND ANAEROBIC 5CC  Final   Culture NO GROWTH 4 DAYS  Final   Report Status PENDING  Incomplete         Radiology Studies: No results found.      Scheduled Meds: . aspirin  325 mg Oral Daily  . atorvastatin  40 mg Oral q1800  . clopidogrel  75 mg Oral Daily  . escitalopram  20 mg Oral Daily  . insulin aspart  0-9  Units Subcutaneous TID WC  . lisinopril  40 mg Oral Daily  . metoprolol  25 mg Oral BID   Continuous Infusions: . sodium chloride 50 mL/hr at 12/28/15 1222     LOS: 4 days       Karanveer Ramakrishnan Gerome Apley, MD [facility] Pager [phone]  If 7PM-7AM, please contact night-coverage [email or web address] Password [facility] 12/29/2015, 9:13 AM

## 2015-12-29 NOTE — Progress Notes (Signed)
    [facility] has been requested to perform a transesophageal echocardiogram on 12/29/2015 for stroke.  After careful review of history and examination, the risks and benefits of transesophageal echocardiogram have been explained including risks of esophageal damage, perforation (1:10,000 risk), bleeding, pharyngeal hematoma as well as other potential complications associated with conscious sedation including aspiration, arrhythmia, respiratory failure and death. Alternatives to treatment were discussed, questions were answered. Patient is willing to proceed.   60 yo presented with R sided numbness on 7/28, found to have punctate 72mm acute ischemic cortical infarcts within high R frontal lobe. Underwent brain angiogram on 8/1 which showed obliterated R pericallosal aneurysm. Cardiology consulted for TEE +/- loop. Vital stable, Hgb and platelet normal. No obvious contraindication.  Almyra Deforest, [place] 12/29/2015 12:46 PM

## 2015-12-30 ENCOUNTER — Inpatient Hospital Stay (HOSPITAL_COMMUNITY): Payer: Managed Care, Other (non HMO)

## 2015-12-30 ENCOUNTER — Encounter (HOSPITAL_COMMUNITY): Payer: Self-pay | Admitting: *Deleted

## 2015-12-30 ENCOUNTER — Encounter (HOSPITAL_COMMUNITY)
Admission: EM | Disposition: A | Discharge status: Home / Self Care | Payer: Self-pay | Source: Home / Self Care | Attending: Internal Medicine

## 2015-12-30 DIAGNOSIS — I63421 Cerebral infarction due to embolism of right anterior cerebral artery: Principal | ICD-10-CM

## 2015-12-30 DIAGNOSIS — R109 Unspecified abdominal pain: Secondary | ICD-10-CM

## 2015-12-30 DIAGNOSIS — R1010 Upper abdominal pain, unspecified: Secondary | ICD-10-CM

## 2015-12-30 DIAGNOSIS — I340 Nonrheumatic mitral (valve) insufficiency: Secondary | ICD-10-CM

## 2015-12-30 HISTORY — PX: EP IMPLANTABLE DEVICE: SHX172B

## 2015-12-30 HISTORY — PX: TEE WITHOUT CARDIOVERSION: SHX5443

## 2015-12-30 LAB — GLUCOSE, CAPILLARY
GLUCOSE-CAPILLARY: 110 mg/dL — AB (ref 65–99)
GLUCOSE-CAPILLARY: 77 mg/dL (ref 65–99)
GLUCOSE-CAPILLARY: 83 mg/dL (ref 65–99)
GLUCOSE-CAPILLARY: 168 mg/dL — AB (ref 65–99)
Glucose-Capillary: 80 mg/dL (ref 65–99)

## 2015-12-30 LAB — PLATELET INHIBITION P2Y12: PLATELET FUNCTION P2Y12: 169 [PRU] — AB (ref 194–418)

## 2015-12-30 SURGERY — LOOP RECORDER INSERTION
Anesthesia: LOCAL

## 2015-12-30 SURGERY — ECHOCARDIOGRAM, TRANSESOPHAGEAL
Anesthesia: Moderate Sedation

## 2015-12-30 MED ORDER — MIDAZOLAM HCL 10 MG/2ML IJ SOLN
INTRAMUSCULAR | Status: DC | PRN
  Administered 2015-12-30 (×3): 2 mg via INTRAVENOUS

## 2015-12-30 MED ORDER — LIDOCAINE-EPINEPHRINE 1 %-1:100000 IJ SOLN
INTRAMUSCULAR | Status: DC | PRN
  Administered 2015-12-30: 20 mL

## 2015-12-30 MED ORDER — SODIUM CHLORIDE 0.9 % IV SOLN: INTRAVENOUS | Status: DC

## 2015-12-30 MED ORDER — FENTANYL CITRATE (PF) 100 MCG/2ML IJ SOLN
INTRAMUSCULAR | Status: AC
  Filled 2015-12-30: qty 2

## 2015-12-30 MED ORDER — FENTANYL CITRATE (PF) 100 MCG/2ML IJ SOLN
INTRAMUSCULAR | Status: DC | PRN
  Administered 2015-12-30: 50 ug via INTRAVENOUS
  Administered 2015-12-30: 25 ug via INTRAVENOUS

## 2015-12-30 MED ORDER — GUAIFENESIN-DM 100-10 MG/5ML PO SYRP: 5.0000 mL | ORAL_SOLUTION | Freq: Four times a day (QID) | ORAL | Status: DC | PRN

## 2015-12-30 MED ORDER — MIDAZOLAM HCL 5 MG/ML IJ SOLN
INTRAMUSCULAR | Status: AC
Start: 2015-12-30 — End: 2015-12-30
  Filled 2015-12-30: qty 2

## 2015-12-30 MED ORDER — BUTAMBEN-TETRACAINE-BENZOCAINE 2-2-14 % EX AERO
INHALATION_SPRAY | CUTANEOUS | Status: DC | PRN
  Administered 2015-12-30: 2 via TOPICAL

## 2015-12-30 MED ORDER — LIDOCAINE-EPINEPHRINE 1 %-1:100000 IJ SOLN
INTRAMUSCULAR | Status: AC
  Filled 2015-12-30: qty 1

## 2015-12-30 SURGICAL SUPPLY — 2 items
LOOP REVEAL LINQSYS (Prosthesis & Implant Heart) ×2 IMPLANT
PACK LOOP INSERTION (CUSTOM PROCEDURE TRAY) ×2 IMPLANT

## 2015-12-30 NOTE — Progress Notes (Signed)
  Echocardiogram Echocardiogram Transesophageal has been performed.  [name] 12/30/2015, 2:56 PM

## 2015-12-30 NOTE — Consult Note (Signed)
ELECTROPHYSIOLOGY CONSULT NOTE  Patient ID: [name] MRN: [id number], DOB/AGE: 1955-09-03   Admit date: 12/24/2015 Date of Consult: 12/30/2015  Primary Physician: Berkley Harvey, NP Primary Cardiologist: [facility] Reason for Consultation: Cryptogenic stroke; recommendations regarding Implantable Loop Recorder  History of Present Illness [name] was admitted on 12/24/2015 with right sided facial and arm numbness. Imaging demonstrated punctate ischemic cortical infarct within the high right frontal lobe.  He has undergone workup for stroke including echocardiogram and carotid dopplers.  The patient has been monitored on telemetry which has demonstrated sinus rhythm with no arrhythmias.  Inpatient stroke work-up is to be completed with a TEE.   Echocardiogram 08/2015 demonstrated EF 60-65%, no RWMA, LA 33.  Lab work is reviewed.  Prior to admission, the patient denies chest pain, shortness of breath, dizziness, palpitations, or syncope.  They are recovering from their stroke with plans to return home at discharge.  EP has been asked to evaluate for placement of an implantable loop recorder to monitor for atrial fibrillation.  Past Medical History:  Diagnosis Date  . Allergy   . Anxiety   . Cataract   . Cerebral hemorrhage ([place])   . Chest pain, atypical    12/2003:  negative cardiolyte  . Depression   . Drug abuse   . Eczema   . Erectile dysfunction   . History of meniscal tear    bilateral  . HLD (hyperlipidemia)   . HTN (hypertension)   . Hx of tear of ACL (anterior cruciate ligament)    right  . Insomnia   . Olecranon bursitis of left elbow 10/2009   s/p I&D by Dr Maxie Better, initially assessed by Dr. Nori Riis   . Prediabetes   . Stroke ([place])   . TIA (transient ischemic attack)      Surgical History:  Past Surgical History:  Procedure Laterality Date  . INCISE AND DRAIN ABCESS     L elbow due to cellulitis/bursitis  . INNER EAR SURGERY    . KNEE ARTHROSCOPY      . RADIOLOGY WITH ANESTHESIA N/A 09/20/2015   Procedure: EMBOLIZATION         (RADIOLOGY WITH ANESTHESIA);  Surgeon: Luanne Bras, MD;  Location: [facility];  Service: Radiology;  Laterality: N/A;  . SKIN TAG REMOVAL     11 removed     Prescriptions Prior to Admission  Medication Sig Dispense Refill Last Dose  . aspirin EC 325 MG EC tablet Take 1 tablet (325 mg total) by mouth daily. 30 tablet 0 12/24/2015 at Unknown time  . atorvastatin (LIPITOR) 10 MG tablet Take 1 tablet (10 mg total) by mouth daily at 6 PM. (Patient taking differently: Take 10 mg by mouth daily. ) 30 tablet 1 12/24/2015 at Unknown time  . butalbital-acetaminophen-caffeine (FIORICET, ESGIC) 50-325-40 MG tablet 1-2 tablets as needed for headache.   No more than 8 per week (Patient taking differently: Take 1-2 tablets by mouth daily as needed for headache. No more than 8 per week) 30 tablet 2 week ago  . chlorthalidone (HYGROTON) 25 MG tablet Take 1 tablet (25 mg total) by mouth daily. 30 tablet 1 12/24/2015 at Unknown time  . clopidogrel (PLAVIX) 75 MG tablet Take 1 tablet (75 mg total) by mouth daily. 30 tablet 0 12/24/2015 at Unknown time  . escitalopram (LEXAPRO) 20 MG tablet Take 1 tablet (20 mg total) by mouth daily. 30 tablet 5 12/24/2015 at Unknown time  . fluticasone (FLONASE) 50 MCG/ACT nasal  spray Place 2 sprays into the nose daily. (Patient taking differently: Place 2 sprays into the nose daily as needed for allergies. ) 16 g 0 6 months ago  . lisinopril (PRINIVIL,ZESTRIL) 20 MG tablet Take 40 mg by mouth daily.   maybe 7/28  . metoprolol (LOPRESSOR) 50 MG tablet Take 1 tablet (50 mg total) by mouth 2 (two) times daily. 60 tablet 1 maybe 7/28 at 830  . triamcinolone ointment (KENALOG) 0.1 % Apply 1 application topically See admin instructions. Apply twice daily to affected area for 1 week, then apply twice daily as needed for irritation/rash   1-2 weeks ago  . amLODipine (NORVASC) 10 MG tablet Take 1 tablet (10 mg total) by  mouth daily. 30 tablet 0 maybe 7/28  . Elastic Bandages & Supports (WRIST SPLINT LEFT/RIGHT) MISC Bilateral cock-up wrist splints for carpal tunnel 2 each 0 Taking    Inpatient Medications:  . aspirin  325 mg Oral Daily  . atorvastatin  40 mg Oral q1800  . clopidogrel  75 mg Oral Daily  . escitalopram  20 mg Oral Daily  . insulin aspart  0-9 Units Subcutaneous TID WC  . metoprolol  25 mg Oral BID    Allergies: No Known Allergies  Social History   Social History  . Marital status: Divorced    Spouse name: N/A  . Number of children: N/A  . Years of education: N/A   Occupational History  . Not on file.   Social History Main Topics  . Smoking status: Never Smoker  . Smokeless tobacco: Never Used  . Alcohol use No  . Drug use: No  . Sexual activity: No   Other Topics Concern  . Not on file   Social History Narrative   Separated, has 2 daughters, works as a Forensic psychologist, Therapist, occupational   Works at [employer], distribution center. 10-15-15     Family History  Problem Relation Age of Onset  . Stroke Mother   . Hypertension Mother   . Aneurysm Mother 45    Died of brain aneursym  . Heart failure Father   . Emphysema Father   . Diabetes Mellitus II Sister   . Colon cancer Neg Hx   . Rectal cancer Neg Hx   . Stomach cancer Neg Hx       Review of Systems: All other systems reviewed and are otherwise negative except as noted above.  Physical Exam: Vitals:   12/29/15 1740 12/29/15 2114 12/30/15 0104 12/30/15 0453  BP: (!) 144/65 (!) 113/56 (!) 118/59 (!) 120/58  Pulse: 72 70 73 75  Resp: (!) 72 20 20 20   Temp: 97.5 F (36.4 C) 98.2 F (36.8 C) 98 F (36.7 C) 98.2 F (36.8 C)  TempSrc: Oral Oral Oral Oral  SpO2: 98% 97% 98% 98%  Weight:      Height:        GEN- The patient is obese appearing, alert and oriented x 3 today.   Head- normocephalic, atraumatic Eyes-  Sclera clear, conjunctiva pink Ears- hearing intact Oropharynx- clear Neck-  supple Lungs- Clear to ausculation bilaterally, normal work of breathing Heart- Regular rate and rhythm, no murmurs, rubs or gallops  GI- soft, NT, ND, + BS Extremities- no clubbing, cyanosis, or edema MS- no significant deformity or atrophy Skin- no rash or lesion Psych- euthymic mood, full affect   Labs:   Lab Results  Component Value Date   WBC 8.9 12/28/2015   HGB 13.7 12/28/2015  HCT 42.5 12/28/2015   MCV 89.9 12/28/2015   PLT 274 12/28/2015     Recent Labs Lab 12/24/15 1348 12/28/15 0621  NA 138 140  K 3.3* 4.3  CL 104 104  CO2 26 29  BUN 20 18  CREATININE 1.14 0.91  CALCIUM 9.1 8.9  PROT 6.7  --   BILITOT 0.7  --   ALKPHOS 78  --   ALT 27  --   AST 20  --   GLUCOSE 142* 114*    Radiology/Studies: Dg Chest 2 View Result Date: 12/24/2015 CLINICAL DATA:  Right facial numbness. EXAM: CHEST  2 VIEW COMPARISON:  Radiographs of September 15, 2015. FINDINGS: The heart size and mediastinal contours are within normal limits. Both lungs are clear. No pneumothorax or pleural effusion is noted. The visualized skeletal structures are unremarkable. IMPRESSION: No active cardiopulmonary disease. Electronically Signed   By: [name], M.D.   On: 12/24/2015 15:22  Ct Head Wo Contrast Result Date: 12/24/2015 CLINICAL DATA:  Facial numbness beginning at 12 noon today. EXAM: CT HEAD WITHOUT CONTRAST TECHNIQUE: Contiguous axial images were obtained from the base of the skull through the vertex without intravenous contrast. COMPARISON:  Brain MRI 09/21/2015.  Head CT scan 09/20/2015. 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 There is some chronic microvascular ischemic change. No evidence of acute intracranial abnormality including hemorrhage, infarct, mass lesion, mass effect, midline shift or abnormal extra-axial fluid collection is seen. Mucous retention cysts or polyps in the maxillary sinuses are identified. Mild ethmoid air cell  disease is noted. The calvarium is intact. IMPRESSION: No acute abnormality. Chronic microvascular ischemic change. 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 Electronically Signed   By: Inge Rise M.D.   On: 12/24/2015 14:33  Mr Brain Wo Contrast Result Date: 12/25/2015 CLINICAL DATA:  Initial evaluation for acute right-sided weakness. EXAM: MRI HEAD WITHOUT CONTRAST TECHNIQUE: Multiplanar, multiecho pulse sequences of the brain and surrounding structures were obtained without intravenous contrast. COMPARISON:  Prior CT from earlier the same day as well as previous MRI from 09/21/2015. FINDINGS: Exam mildly degraded by motion artifact. Cerebral volume is stable from previous studies, and remains within normal limits for patient age. Mild scattered patchy T2/FLAIR hyperintensity within the periventricular white matter, most likely related to chronic small vessel ischemic disease. Mild chronic microvascular ischemic changes also noted within the pons. 5 mm focus of restricted diffusion present within the cortical gray matter of the anterior right frontal lobe near the vertex, consistent with a small acute ischemic cortical infarct (series 4, image 46). No associated hemorrhage or mass effect. This is right ACA territory. There is subtly increased linear diffusion abnormality within the deep/ subcortical white matter of the anterior right frontal lobe (series 4, image 36). No associated signal loss on ADC map. This demonstrates associated increased T2/FLAIR signal abnormality (series 7, image 18). This is new from prior, and likely reflects the sequela of late subacute to chronic white matter/small vessel ischemia. No other acute infarct. Gray-white matter differentiation otherwise maintained. Major intracranial vascular flow voids are preserved. Susceptibility artifact from pipeline device within the left ACA noted. Intrinsic T1 signal intensity within the known pericallosal aneurysm  likely reflects intraluminal thrombus. No acute intracranial hemorrhage seen on prior CT. No mass lesion, midline shift, or mass effect. No hydrocephalus. No extra-axial fluid collection. Major dural sinuses are grossly patent. Craniocervical junction normal. Visualized upper cervical spine unremarkable. Pituitary gland  normal. No acute abnormality about the globes and orbits. Axial myopia noted. Scattered opacity mucosal thickening within the ethmoidal air cells and maxillary sinuses. Retention cyst noted within the right maxilla sinus. Small amount of layering opacity within the left sphenoid sinus. No mastoid effusion. Inner ear structures grossly normal. Bone marrow signal intensity within normal limits. No scalp soft tissue abnormality. IMPRESSION: 1. Punctate 5 mm acute ischemic cortical infarct within the high right frontal lobe. No associated hemorrhage. 2. Subtle linear diffusion abnormality within the deep/subcortical white matter of the anterior right frontal lobe, most consistent with late subacute to chronic ischemic changes. This is consistent with progressive small vessel white matter disease, new relative to most recent MRI from 09/21/2015. 3. Pipeline stent in place left anterior cerebral artery. 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 Electronically Signed   By: Jeannine Boga M.D.   On: 12/25/2015 00:04   12-lead ECG sinus rhythm All prior EKG's in EPIC reviewed with no documented atrial fibrillation  Telemetry sinus rhythm   Assessment and Plan:  1. Cryptogenic stroke The patient presents with cryptogenic stroke.  The patient has a TEE planned for this AM.  I spoke at length with the patient about monitoring for afib with either a 30 day event monitor or an implantable loop recorder.  Risks, benefits, and alteratives to implantable loop recorder were discussed with the patient today.   At this time, the patient is very clear in their decision to proceed with  implantable loop recorder.   Wound care was reviewed with the patient (keep incision clean and dry for 3 days).  Wound check scheduled for 01/06/16  Please call with questions.   Chanetta Marshall, NP 12/30/2015 8:24 AM  I have seen, examined the patient, and reviewed the above assessment and plan.  On exam, RRR. Changes to above are made where necessary.    Co Sign: Thompson Grayer, MD

## 2015-12-30 NOTE — Progress Notes (Signed)
STROKE TEAM PROGRESS NOTE   SUBJECTIVE (INTERVAL HISTORY) No family at bedside. Patient finished TEE and had loop recorder.   OBJECTIVE Temp:  [97.5 F (36.4 C)-98.5 F (36.9 C)] 98.5 F (36.9 C) (08/03 1454) Pulse Rate:  [64-81] 65 (08/03 1454) Cardiac Rhythm: Normal sinus rhythm (08/03 1335) Resp:  [11-72] 20 (08/03 1454) BP: (107-171)/(48-89) 126/68 (08/03 1454) SpO2:  [94 %-98 %] 95 % (08/03 1454)  CBC:   Recent Labs Lab 12/24/15 1348 12/28/15 0621  WBC 11.7* 8.9  NEUTROABS 7.8*  --   HGB 14.4 13.7  HCT 42.2 42.5  MCV 88.1 89.9  PLT 310 123456    Basic Metabolic Panel:   Recent Labs Lab 12/24/15 1348 12/28/15 0621  NA 138 140  K 3.3* 4.3  CL 104 104  CO2 26 29  GLUCOSE 142* 114*  BUN 20 18  CREATININE 1.14 0.91  CALCIUM 9.1 8.9    Lipid Panel:     Component Value Date/Time   CHOL 200 12/28/2015 0613   TRIG 121 12/28/2015 0613   HDL 36 (L) 12/28/2015 0613   CHOLHDL 5.6 12/28/2015 0613   VLDL 24 12/28/2015 0613   LDLCALC 140 (H) 12/28/2015 0613   HgbA1c:  Lab Results  Component Value Date   HGBA1C 6.2 (H) 12/28/2015   Urine Drug Screen:     Component Value Date/Time   LABOPIA NONE DETECTED 12/25/2015 2115   COCAINSCRNUR NONE DETECTED 12/25/2015 2115   COCAINSCRNUR NEG 06/07/2006 0039   LABBENZ NONE DETECTED 12/25/2015 2115   LABBENZ NEG 06/07/2006 0039   AMPHETMU NONE DETECTED 12/25/2015 2115   THCU NONE DETECTED 12/25/2015 2115   LABBARB NONE DETECTED 12/25/2015 2115      IMAGING  Dr. Erlinda Hong has personally reviewed the radiological images below and agree with the radiology interpretations.  Dg Chest 2 View 12/24/2015 No active cardiopulmonary disease.   Ct Head Wo Contrast 12/24/2015 No acute abnormality. Chronic microvascular ischemic change. 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   Mr Brain Wo Contrast 12/25/2015 1. Punctate 5 mm acute ischemic cortical infarct within the high right frontal lobe. No associated  hemorrhage.  2. Subtle linear diffusion abnormality within the deep/subcortical white matter of the anterior right frontal lobe, most consistent with late subacute to chronic ischemic changes. This is consistent with progressive small vessel white matter disease, new relative to most recent MRI from 09/21/2015.  3. Pipeline stent in place left anterior cerebral artery. 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   2D Echocardiogram  - Left ventricle: The cavity size was normal. Systolic function was normal. The estimated ejection fraction was in the range of 60% to 65%. Wall motion was normal; there were no regional wall motion abnormalities. Left ventricular diastolic function parameters were normal. - Mitral valve: There was no regurgitation. - Right ventricle: The cavity size was normal. Wall thickness was normal. Systolic function was normal. - Atrial septum: No defect or patent foramen ovale was identified by color flow Doppler. - Tricuspid valve: There was no regurgitation. - Inferior vena cava: The vessel was normal in size. The respirophasic diameter changes were in the normal range (>= 50%), consistent with normal central venous pressure.  Carotid Doppler   There is 1-39% bilateral ICA stenosis. Vertebral artery flow is antegrade.    Cerebral angio - completely obliterated Rt pericallosal aneurysm  TEE Normal LV size with mild LV hypertrophy.  EF 55-60%.  Normal RV size and systolic function.  Normal atrial sizes.  No LA appendage thrombus.  Mild MR.  Trivial TR.  Trileaflet aortic valve with no stenosis or regurgitation.  Normal aortic size, no significant plaque.  No ASD or PFO.    PHYSICAL EXAM HEENT-  Normocephalic, no lesions, without obvious abnormality.  Normal external eye and conjunctiva.   Cardiovascular- S1, S2 normal, pulses palpable throughout   Lungs- chest clear, no wheezing, rales, normal symmetric air entry Abdomen- normal findings: bowel sounds  normal Extremities- no edema Lymph-no adenopathy palpable Musculoskeletal-no joint tenderness, deformity or swelling.  No left knee pain  Neurological Examination Mental Status: Alert, oriented, thought content appropriate.  Speech fluent without evidence of aphasia.  Able to follow 3 step commands without difficulty. Cranial Nerves: II:  Visual fields grossly normal, pupils equal, round, reactive to light and accommodation III,IV, VI: ptosis not present, extra-ocular motions intact bilaterally V,VII: smile symmetric, facial light touch sensation normal bilaterally VIII: hearing significantly decreased on the left and also decreased on the right.  IX,X: uvula rises symmetrically XI: bilateral shoulder shrug XII: midline tongue extension Motor: Right :            Upper extremity   5/5                                                                              Left:     Upper extremity   5/5                       Lower extremity   5/5                                                                                           Lower extremity   5/5 Tone and bulk:normal tone throughout; no atrophy noted Sensory: Light touch intact throughout, bilaterally Cerebellar: normal finger-to-nose; left hand tremulous but no pass pointing Gait: normal   ASSESSMENT/PLAN [name] is a 60 y.o. 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 He did not receive IV t-PA due to improvement in deficits.  Stroke:  Incidental non-dominant R frontal infarcts in setting of recent R ACA pipeline stent placement, initially concern for re-stenosis or residue aneurysm but angio largely unremarkable. Give plavix load due to high P2Y12. To rule out afib, had TEE and loop recorder.  Resultant  deficits resolved  MRI -  Punctate 5 mm acute ischemic cortical infarct within the high right frontal lobe  MRA - not performed  Carotid Doppler No significant stenosis   2D Echo EF 60-65%. No source of embolus   TEE negative  Loop recorder placed  LDL - 140  HgbA1c - 6.2  VTE prophylaxis -  SCDs  aspirin 325 mg daily and clopidogrel 75 mg daily prior to admission, now on aspirin 325 mg daily and clopidogrel 75 mg daily. P2Y12 level 182, ordered plavix load today (total of 300 mg today) and recheck P2Y12 today 169. Discussed with Dr. Estanislado Pandy. He is ok to continue ASA 325 and plavix 75 as current dose.   Patient counseled to be compliant with his antithrombotic medications  Ongoing aggressive stroke risk factor management  Consider to be off work 2 weeks post discharge. Then gradually increase work load until full time schedule. Pt understood.  Follow up with Dr. Felecia Shelling at [facility] on 02/21/16.  Episode of right scalp, face and arm numbness - ? Complicated migraine  Not fit to the stroke this time  Likely due to complicated migraine equivalent  Prescribed depakote last admission  Pt not taking depakote at home with no recurrence of HA  Bilateral hand numbness  Questionable for neuropathy  Tolerable currently  Follow up with Dr. Felecia Shelling on 02/21/16 to consider EMG/NCS  Hypertension BP low at times. Discontinue norvasc, and cut metoprolol in half, continue lisinopril Close monitoring BP goal normotensive  Hyperlipidemia  On home lipitor 10  LDL 140, goal < 70  Increase lipitor to 40mg   Continue lipitor on discharge  Other Stroke Risk Factors  Advanced age  Obesity, Body mass index is 38.53 kg/m., recommend weight loss, diet and exercise as appropriate   Hx stroke/TIA  Family hx stroke (mother)  Migraines  Other Active Problems  Mild hypokalemia  Mild leukocytosis  Hospital day # 5  Neurology will sign off. Please call with questions. Pt will follow up with Dr. Felecia Shelling at  [facility] on 02/21/16. Thanks for the consult.  Rosalin Hawking, MD PhD Stroke Neurology 12/30/2015 4:39 PM       To contact Stroke Continuity provider, please refer to [email or web address] After hours, contact General Neurology

## 2015-12-30 NOTE — Progress Notes (Addendum)
Patient A/O, no noted distress. Discomfort upper mid left quad (abd), tender in the area,. Patient states "knot sometimes protrude outward and I just pushes it in." CBG 80. TEE placement tolerated well. Patient has been ambulating well in room without any discomfort. Affect flat. Patient anxious about the hernia after MD spoke with him. He states, "if I need surgery, I would like to have done now, so I will be able resume my short term disability at work."  Staff will continue to monitor and meet needs.

## 2015-12-30 NOTE — Progress Notes (Deleted)
Pt is admitted to 5M08. Admission vital sign is stable

## 2015-12-30 NOTE — Progress Notes (Signed)
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 R frontal 5 mm infarct. Will continue dual antiplatelet therapy with asa and clopidogrel (sp load), statin therapy with atorvastatin 40 mg daily, continue blood pressure control, avoid hypotension. Patient on sinus rhythm. Continue neuro checks and physical therapy evaluation. Carotid US with no significant stenosis. Cerebral arteriogram with progressive thrombus within known pericallosal aneurysm. TEE with no thrombus, loop recorder placed.   2. HTN, Blood pressure control with metoprolol. Systolic blood pressure [id number] to 126. Will hold on lisinopril to avoid worsening hypotension.  3. Hyperglycemia. Continue insulin sliding scale for glucose monitor and coverage. Serum glucose 80-77-83. Patient tolerating po well, Hb A1c 6.2  4. Abdominal pain. Noted small ventral hernia,  Reducible, will continue close obsevation, continue cough supressive therapy.     DVT prophylaxis: scd Code Status: full code Family Communication: none  Disposition Plan:    Consultants:   neurology  Procedures:  Antimicrobials:   Subjective: Patient  complains of cough, intermittent, associated with abdominal pain, dull in nature. Cough is dry, no improving or worsening factors. No nausea or vomiting.  Objective: Vitals:   12/30/15 1330 12/30/15 1335 12/30/15 1345 12/30/15 1454  BP: (!) 126/50 (!) 107/50 (!) 107/48 126/68  Pulse: 72 67 64 65  Resp: (!) 23 (!) 22 (!) 22 20  Temp:    98.5 F (36.9 C)  TempSrc:    Oral  SpO2: 97% 95% 94% 95%  Weight:      Height:        Intake/Output Summary (Last 24 hours) at 12/30/15 1533 Last data filed at 12/29/15 1700  Gross per 24 hour  Intake              360 ml  Output                0 ml  Net              360 ml   Filed Weights   12/24/15 1341 12/25/15 0800  Weight: 112.5 kg (248 lb) 114.9 kg (253 lb 6.4 oz)    Examination:  General exam: not in pain or dyspnea E ENT: no conjunctival pallor, oral mucosa moist. Respiratory system: Clear to auscultation. Respiratory effort normal. No wheezing, rales or rhonchi. Cardiovascular system: S1 & S2 heard, RRR. No JVD, murmurs, rubs, gallops or clicks. No pedal edema. Gastrointestinal system: Abdomen is nondistended, soft and nontender. No organomegaly or masses felt. Normal bowel sounds heard. Central nervous system: Alert and oriented. No focal neurological deficits. Extremities: Symmetric 5 x 5 power. Skin: No rashes, lesions or ulcers     Data Reviewed: I have  personally reviewed following labs and imaging studies  CBC:  Recent Labs Lab 12/24/15 1348 12/28/15 0621  WBC 11.7* 8.9  NEUTROABS 7.8*  --   HGB 14.4 13.7  HCT 42.2 42.5  MCV 88.1 89.9  PLT 310 123456   Basic Metabolic Panel:  Recent Labs Lab 12/24/15 1348 12/28/15 0621  NA 138 140  K 3.3* 4.3  CL 104 104  CO2 26 29  GLUCOSE 142* 114*  BUN 20 18  CREATININE 1.14 0.91  CALCIUM 9.1 8.9   GFR: Estimated Creatinine Clearance: 104.9 mL/min (by C-G formula based on SCr of 0.91 mg/dL). Liver Function Tests:  Recent Labs Lab 12/24/15 1348  AST 20  ALT  27  ALKPHOS 78  BILITOT 0.7  PROT 6.7  ALBUMIN 3.6   No results for input(s): LIPASE, AMYLASE in the last 168 hours. No results for input(s): AMMONIA in the last 168 hours. Coagulation Profile:  Recent Labs Lab 12/28/15 0705  INR 1.02   Cardiac Enzymes: No results for input(s): CKTOTAL, CKMB, CKMBINDEX, TROPONINI in the last 168 hours. BNP (last 3 results) No results for input(s): PROBNP in the last 8760 hours. HbA1C:  Recent Labs  12/28/15 0621  HGBA1C 6.2*   CBG:  Recent Labs Lab 12/29/15 1644 12/29/15 2016 12/30/15 0627 12/30/15 0826 12/30/15 1515  GLUCAP 110* 143* 110* 80 77   Lipid Profile:  Recent Labs  12/28/15 0613  CHOL 200  HDL 36*  LDLCALC 140*  TRIG 121  CHOLHDL 5.6   Thyroid Function Tests: No results for input(s): TSH, T4TOTAL, FREET4, T3FREE, THYROIDAB in the last 72 hours. Anemia Panel: No results for input(s): VITAMINB12, FOLATE, FERRITIN, TIBC, IRON, RETICCTPCT in the last 72 hours. Sepsis Labs: No results for input(s): PROCALCITON, LATICACIDVEN in the last 168 hours.  Recent Results (from the past 240 hour(s))  Culture, blood (Routine X 2) w Reflex to ID Panel     Status: None   Collection Time: 12/24/15  6:26 PM  Result Value Ref Range Status   Specimen Description BLOOD RIGHT ANTECUBITAL  Final   Special Requests BOTTLES DRAWN AEROBIC AND ANAEROBIC 5CC  Final   Culture NO GROWTH 5 DAYS  Final   Report Status 12/29/2015 FINAL  Final  Culture, blood (Routine X 2) w Reflex to ID Panel     Status: None   Collection Time: 12/24/15  6:34 PM  Result Value Ref Range Status   Specimen Description BLOOD LEFT ANTECUBITAL  Final   Special Requests BOTTLES DRAWN AEROBIC AND ANAEROBIC 5CC  Final   Culture NO GROWTH 5 DAYS  Final   Report Status 12/29/2015 FINAL  Final         Radiology Studies: No results found.      Scheduled Meds: . [MAR Hold] aspirin  325 mg Oral Daily  . [MAR Hold] atorvastatin  40 mg Oral q1800  . [MAR  Hold] clopidogrel  75 mg Oral Daily  . [MAR Hold] escitalopram  20 mg Oral Daily  . [MAR Hold] insulin aspart  0-9 Units Subcutaneous TID WC  . [facility] Hold] metoprolol  25 mg Oral BID   Continuous Infusions: . sodium chloride 50 mL/hr at 12/28/15 1222     LOS: 5 days       Pearly Apachito Gerome Apley, MD [facility] Pager [phone]  If 7PM-7AM, please contact night-coverage [email or web address] Password [facility] 12/30/2015, 3:33 PM

## 2015-12-30 NOTE — CV Procedure (Signed)
Procedure:TEE  Indication: CVA  Sedation: Versed 6 mg IV, Fentanyl 75 mcg IV  Findings: Please see echo section for full report.  Normal LV size with mild LV hypertrophy.  EF 55-60%.  Normal RV size and systolic function.  Normal atrial sizes.  No LA appendage thrombus.  Mild MR.  Trivial TR.  Trileaflet aortic valve with no stenosis or regurgitation.  Normal aortic size, no significant plaque.  No ASD or PFO.    No source of embolus.   [name] 12/30/2015 1:29 PM

## 2015-12-30 NOTE — Progress Notes (Signed)
Consent signed and in chart.   Yamilette Garretson, RN 

## 2015-12-30 NOTE — H&P (View-Only) (Signed)
    [facility] has been requested to perform a transesophageal echocardiogram on 12/29/2015 for stroke.  After careful review of history and examination, the risks and benefits of transesophageal echocardiogram have been explained including risks of esophageal damage, perforation (1:10,000 risk), bleeding, pharyngeal hematoma as well as other potential complications associated with conscious sedation including aspiration, arrhythmia, respiratory failure and death. Alternatives to treatment were discussed, questions were answered. Patient is willing to proceed.   60 yo presented with R sided numbness on 7/28, found to have punctate 69mm acute ischemic cortical infarcts within high R frontal lobe. Underwent brain angiogram on 8/1 which showed obliterated R pericallosal aneurysm. Cardiology consulted for TEE +/- loop. Vital stable, Hgb and platelet normal. No obvious contraindication.  Almyra Deforest, [place] 12/29/2015 12:46 PM

## 2015-12-30 NOTE — Interval H&P Note (Signed)
History and Physical Interval Note:  12/30/2015 1:17 PM  [name]  has presented today for surgery, with the diagnosis of stroke  The various methods of treatment have been discussed with the patient and family. After consideration of risks, benefits and other options for treatment, the patient has consented to  Procedure(s): TRANSESOPHAGEAL ECHOCARDIOGRAM (TEE) (N/A) as a surgical intervention .  The patient's history has been reviewed, patient examined, no change in status, stable for surgery.  I have reviewed the patient's chart and labs.  Questions were answered to the patient's satisfaction.     [place] [employer]

## 2015-12-31 ENCOUNTER — Encounter (HOSPITAL_COMMUNITY): Payer: Self-pay | Admitting: Interventional Radiology

## 2015-12-31 LAB — GLUCOSE, CAPILLARY
Glucose-Capillary: 99 mg/dL (ref 65–99)
Glucose-Capillary: 101 mg/dL — ABNORMAL HIGH (ref 65–99)

## 2015-12-31 MED ORDER — CHLORTHALIDONE 25 MG PO TABS: 25.0000 mg | ORAL_TABLET | Freq: Every day | ORAL | 0 refills | Status: DC

## 2015-12-31 MED ORDER — AMLODIPINE BESYLATE 10 MG PO TABS: 10.0000 mg | ORAL_TABLET | Freq: Every day | ORAL | 0 refills | Status: DC

## 2015-12-31 MED ORDER — GUAIFENESIN-DM 100-10 MG/5ML PO SYRP: 5.0000 mL | ORAL_SOLUTION | Freq: Three times a day (TID) | ORAL | 0 refills | Status: DC | PRN

## 2015-12-31 MED ORDER — METOPROLOL TARTRATE 50 MG PO TABS: 50.0000 mg | ORAL_TABLET | Freq: Two times a day (BID) | ORAL | 0 refills | Status: DC

## 2015-12-31 MED ORDER — ESCITALOPRAM OXALATE 20 MG PO TABS: 20.0000 mg | ORAL_TABLET | Freq: Every day | ORAL | 0 refills | Status: DC

## 2015-12-31 MED ORDER — LISINOPRIL 20 MG PO TABS: 40.0000 mg | ORAL_TABLET | Freq: Every day | ORAL | 0 refills | Status: DC

## 2015-12-31 MED ORDER — ATORVASTATIN CALCIUM 40 MG PO TABS: 40.0000 mg | ORAL_TABLET | Freq: Every day | ORAL | 0 refills | Status: DC

## 2015-12-31 MED ORDER — ASPIRIN 325 MG PO TBEC: 325.0000 mg | DELAYED_RELEASE_TABLET | Freq: Every day | ORAL | 0 refills | Status: DC

## 2015-12-31 MED ORDER — TRIAMCINOLONE ACETONIDE 0.1 % EX OINT: 1.0000 "application " | TOPICAL_OINTMENT | CUTANEOUS | 0 refills | Status: DC

## 2015-12-31 MED ORDER — FLUTICASONE PROPIONATE 50 MCG/ACT NA SUSP: 2.0000 | Freq: Every day | NASAL | 0 refills | Status: DC | PRN

## 2015-12-31 NOTE — Discharge Summary (Signed)
Physician Discharge Summary  [name] [id number] DOB: 27-Nov-1955 DOA: 12/24/2015  PCP: Berkley Harvey, NP  Admit date: 12/24/2015 Discharge date: 12/31/2015  Admitted From: Home Disposition:  Home  Recommendations for Outpatient Follow-up:  1. Follow up with PCP in 1-2 weeks 2.   Will need outpatient surgery referral for abdominal wall/ ventral hernia  Home Health: no Equipment/Devices: no  Discharge Condition: stable CODE STATUS: full Diet recommendation: Heart Healthy   Brief/Interim Summary: This is a 60 year old gentleman who presented to the hospital with the chief complaint of right-sided numbness. Symptoms were localized to the right side of his face, right arm. The time he was evaluated in the emergency department his symptoms had resolved. His blood pressure was 167, heart rate 60, temperature 98.5, respiratory 22. His mucous membranes were moist, neck was supple, his lungs were clear to auscultation bilaterally, heart S1-S2 present rhythmic, abdomen was soft nontender: Extremities had no edema, neurologically patient had strength 5 over 5 all 4 extremities with grossly intact cranial nerves II-12. His serum sodium was 138, potassium 3.3, creatinine 1.14, glucose 142. His white count was 11.7 with a hemoglobin of 14.4 and a blood count of 310. CT of the head was negative for acute changes.EKG was sinus rhythm.  Patient was admitted to the hospital with the working diagnosis of focal neurologic deficit rule out new CVA or simple partial seizures.  1. Right frontal lobe CVA. Patient underwent further workup with MRI which showed a punctuate 5 mm acute ischemic cortical infarct within the high right frontal lobe with no associated hemorrhage. 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 Carotid ultrasound was negative for significant stenosis. Echocardiogram was negative for intracardiac thrombus. 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 Transesophageal echocardiography showed ejection fraction 55 to 60% with no signs of thrombus, AST or PFO. P2y12 level 182, patient was loaded with clopidogrel, instructions to continue aspirin 325 and Plavix 75 g daily. Recommendation to stay off work for next 2 weeks and gradually increase work load until full-time schedule. Follow-up with Dr. Felecia Shelling on 02/21/2016.  2. Hypertension. Patient's antihypertensive agents were held besides metoprolol due to risk of hypotension, his systolic blood pressure has been ranging between 100 and 126. At time of discharge patient will resume his antihypertensive this including amlodipine, metoprolol, lisinopril, and chlorthalidone. Instructed to hold his medications a blood pressure less than [id number] systolic. He should have a close follow-up as an outpatient.  3. Abdominal hernia. Patient had incidental finding of a ventral abdominal hernia that is reducible. On further questioning patient had this hernia for last 5 years. Lately has been more symptomatic due to cough. Patient was placed on cough suppressant medicine and instructions to follow-up as an outpatient, patient may need a referral for surgical consultation.  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   aspirin 325 MG EC tablet Take 1 tablet (325 mg total) by mouth daily.   atorvastatin 40 MG tablet Commonly known as:  LIPITOR Take 1 tablet (40 mg total) by mouth daily at 6 PM. What changed:  medication strength  how much to take   chlorthalidone 25 MG tablet Commonly known as:  HYGROTON Take 1 tablet (25 mg total) by mouth daily.   clopidogrel 75 MG tablet Commonly known as:  PLAVIX Take 1 tablet (75 mg total) by mouth daily.   escitalopram 20 MG tablet Commonly known as:  LEXAPRO Take 1 tablet (20 mg total) by mouth daily.   fluticasone 50 MCG/ACT nasal spray Commonly known as:  FLONASE Place 2 sprays into both nostrils daily as needed for allergies. What changed:  how to take this  when to take this  reasons to take this   guaiFENesin-dextromethorphan 100-10 MG/5ML syrup Commonly known as:  ROBITUSSIN DM Take 5 mLs by mouth every 8 (eight) hours as needed for cough.   lisinopril 20 MG tablet Commonly known as:  PRINIVIL,ZESTRIL Take 2 tablets (40 mg total) by mouth daily.   metoprolol 50 MG tablet Commonly known as:  LOPRESSOR Take 1 tablet (50 mg total) by mouth 2 (two) times daily.   triamcinolone ointment 0.1 % Commonly known as:  KENALOG Apply 1 application topically See admin instructions. Apply twice daily to affected area for 1 week, then apply twice daily as needed for irritation/rash   Wrist Splint Left/Right Misc Bilateral cock-up wrist splints for carpal tunnel      Follow-up Information    [facility] Follow up on 01/06/2016.   Specialty:  Cardiology Why:  at [facility] for wound check 8/ Contact information: [address], [place] [phone]       Britt Bottom, MD. Go on 02/21/2016.   Specialty:  Neurology Contact information: [address] [place] [address] [phone]          No Known  Allergies  Consultations: Neurology   Procedures/Studies: Dg Chest 2 View  Result Date: 12/24/2015 CLINICAL DATA:  Right facial numbness. EXAM: CHEST  2 VIEW COMPARISON:  Radiographs of September 15, 2015. FINDINGS: The heart size and mediastinal contours are within normal limits. Both lungs are clear. No pneumothorax or pleural effusion is noted. The visualized skeletal structures are unremarkable. IMPRESSION: No active cardiopulmonary disease. Electronically Signed   By: [name], M.D.   On: 12/24/2015 15:22  Ct Head Wo Contrast  Result Date: 12/24/2015 CLINICAL DATA:  Facial numbness beginning at 12 noon today. EXAM: CT HEAD WITHOUT CONTRAST TECHNIQUE: Contiguous axial images were obtained from the base of the skull through the vertex without intravenous contrast. COMPARISON:  Brain MRI 09/21/2015.  Head CT scan 09/20/2015. 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 There is some chronic microvascular ischemic change. No evidence of acute intracranial abnormality including hemorrhage, infarct, mass lesion, mass effect, midline shift or abnormal extra-axial fluid collection is seen. Mucous retention cysts or polyps in the maxillary sinuses are identified. Mild ethmoid air cell disease is noted. The calvarium is intact. IMPRESSION: No acute abnormality. Chronic microvascular ischemic change. 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 Electronically Signed   By: Inge Rise M.D.   On: 12/24/2015 14:33  Mr Brain  Wo Contrast  Result Date: 12/25/2015 CLINICAL DATA:  Initial evaluation for acute right-sided weakness. EXAM: MRI HEAD WITHOUT CONTRAST TECHNIQUE: Multiplanar, multiecho pulse sequences of the brain and surrounding structures were obtained without intravenous contrast. COMPARISON:  Prior CT from earlier the same day as well as previous MRI from 09/21/2015. FINDINGS: Exam mildly degraded by motion artifact. Cerebral  volume is stable from previous studies, and remains within normal limits for patient age. Mild scattered patchy T2/FLAIR hyperintensity within the periventricular white matter, most likely related to chronic small vessel ischemic disease. Mild chronic microvascular ischemic changes also noted within the pons. 5 mm focus of restricted diffusion present within the cortical gray matter of the anterior right frontal lobe near the vertex, consistent with a small acute ischemic cortical infarct (series 4, image 46). No associated hemorrhage or mass effect. This is right ACA territory. There is subtly increased linear diffusion abnormality within the deep/ subcortical white matter of the anterior right frontal lobe (series 4, image 36). No associated signal loss on ADC map. This demonstrates associated increased T2/FLAIR signal abnormality (series 7, image 18). This is new from prior, and likely reflects the sequela of late subacute to chronic white matter/small vessel ischemia. No other acute infarct. Gray-white matter differentiation otherwise maintained. Major intracranial vascular flow voids are preserved. Susceptibility artifact from pipeline device within the left ACA noted. 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 No acute intracranial hemorrhage seen on prior CT. No mass lesion, midline shift, or mass effect. No hydrocephalus. No extra-axial fluid collection. Major dural sinuses are grossly patent. Craniocervical junction normal. Visualized upper cervical spine unremarkable. Pituitary gland normal. No acute abnormality about the globes and orbits. Axial myopia noted. Scattered opacity mucosal thickening within the ethmoidal air cells and maxillary sinuses. Retention cyst noted within the right maxilla sinus. Small amount of layering opacity within the left sphenoid sinus. No mastoid effusion. Inner ear structures grossly normal. Bone marrow signal  intensity within normal limits. No scalp soft tissue abnormality. IMPRESSION: 1. Punctate 5 mm acute ischemic cortical infarct within the high right frontal lobe. No associated hemorrhage. 2. Subtle linear diffusion abnormality within the deep/subcortical white matter of the anterior right frontal lobe, most consistent with late subacute to chronic ischemic changes. This is consistent with progressive small vessel white matter disease, new relative to most recent MRI from 09/21/2015. 3. Pipeline stent in place left anterior cerebral artery. 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 Electronically Signed   By: Jeannine Boga M.D.   On: 12/25/2015 00:04      Subjective: Patient is feeling much better, denies any chest pain or shortness of breath. Patient abdominal pain is stable, no nausea vomiting or diarrhea.  Discharge Exam: Vitals:   12/31/15 0127 12/31/15 0518  BP: 130/77 125/71  Pulse: 64 65  Resp: 18 18  Temp: 97.9 F (36.6 C) 98.2 F (36.8 C)   Vitals:   12/30/15 1901 12/30/15 2120 12/31/15 0127 12/31/15 0518  BP:  136/71 130/77 125/71  Pulse:  68 64 65  Resp:  18 18 18   Temp:  98.2 F (36.8 C) 97.9 F (36.6 C) 98.2 F (36.8 C)  TempSrc:  Oral Oral Oral  SpO2: 97% 98% 98% 96%  Weight:      Height:        General: Pt is alert, awake, not in acute distress Cardiovascular: RRR, S1/S2 +, no rubs, no gallops Respiratory: CTA bilaterally, no wheezing, no rhonchi Abdominal: Soft, NT,  ND, bowel sounds +Noted epigastric abdominal wall hernia, reducible Extremities: no edema, no cyanosis    The results of significant diagnostics from this hospitalization (including imaging, microbiology, ancillary and laboratory) are listed below for reference.     Microbiology: Recent Results (from the past 240 hour(s))  Culture, blood (Routine X 2) w Reflex to ID Panel     Status: None   Collection Time: 12/24/15  6:26 PM  Result Value Ref Range Status    Specimen Description BLOOD RIGHT ANTECUBITAL  Final   Special Requests BOTTLES DRAWN AEROBIC AND ANAEROBIC 5CC  Final   Culture NO GROWTH 5 DAYS  Final   Report Status 12/29/2015 FINAL  Final  Culture, blood (Routine X 2) w Reflex to ID Panel     Status: None   Collection Time: 12/24/15  6:34 PM  Result Value Ref Range Status   Specimen Description BLOOD LEFT ANTECUBITAL  Final   Special Requests BOTTLES DRAWN AEROBIC AND ANAEROBIC 5CC  Final   Culture NO GROWTH 5 DAYS  Final   Report Status 12/29/2015 FINAL  Final     Labs: BNP (last 3 results) No results for input(s): BNP in the last 8760 hours. Basic Metabolic Panel:  Recent Labs Lab 12/24/15 1348 12/28/15 0621  NA 138 140  K 3.3* 4.3  CL 104 104  CO2 26 29  GLUCOSE 142* 114*  BUN 20 18  CREATININE 1.14 0.91  CALCIUM 9.1 8.9   Liver Function Tests:  Recent Labs Lab 12/24/15 1348  AST 20  ALT 27  ALKPHOS 78  BILITOT 0.7  PROT 6.7  ALBUMIN 3.6   No results for input(s): LIPASE, AMYLASE in the last 168 hours. No results for input(s): AMMONIA in the last 168 hours. CBC:  Recent Labs Lab 12/24/15 1348 12/28/15 0621  WBC 11.7* 8.9  NEUTROABS 7.8*  --   HGB 14.4 13.7  HCT 42.2 42.5  MCV 88.1 89.9  PLT 310 274   Cardiac Enzymes: No results for input(s): CKTOTAL, CKMB, CKMBINDEX, TROPONINI in the last 168 hours. BNP: Invalid input(s): POCBNP CBG:  Recent Labs Lab 12/30/15 0826 12/30/15 1515 12/30/15 1628 12/30/15 2136 12/31/15 0637  GLUCAP 80 77 83 168* 99   D-Dimer No results for input(s): DDIMER in the last 72 hours. Hgb A1c No results for input(s): HGBA1C in the last 72 hours. Lipid Profile No results for input(s): CHOL, HDL, LDLCALC, TRIG, CHOLHDL, LDLDIRECT in the last 72 hours. Thyroid function studies No results for input(s): TSH, T4TOTAL, T3FREE, THYROIDAB in the last 72 hours.  Invalid input(s): FREET3 Anemia work up No results for input(s): VITAMINB12, FOLATE, FERRITIN, TIBC,  IRON, RETICCTPCT in the last 72 hours. Urinalysis    Component Value Date/Time   COLORURINE YELLOW 12/25/2015 2115   APPEARANCEUR CLEAR 12/25/2015 2115   LABSPEC 1.018 12/25/2015 2115   PHURINE 7.0 12/25/2015 2115   GLUCOSEU NEGATIVE 12/25/2015 2115   HGBUR NEGATIVE 12/25/2015 2115   HGBUR small 02/06/2008 0856   BILIRUBINUR NEGATIVE 12/25/2015 2115   KETONESUR NEGATIVE 12/25/2015 2115   PROTEINUR NEGATIVE 12/25/2015 2115   UROBILINOGEN 0.2 02/06/2008 1047   NITRITE NEGATIVE 12/25/2015 2115   LEUKOCYTESUR NEGATIVE 12/25/2015 2115   Sepsis Labs Invalid input(s): PROCALCITONIN,  WBC,  LACTICIDVEN Microbiology Recent Results (from the past 240 hour(s))  Culture, blood (Routine X 2) w Reflex to ID Panel     Status: None   Collection Time: 12/24/15  6:26 PM  Result Value Ref Range Status   Specimen Description BLOOD  RIGHT ANTECUBITAL  Final   Special Requests BOTTLES DRAWN AEROBIC AND ANAEROBIC 5CC  Final   Culture NO GROWTH 5 DAYS  Final   Report Status 12/29/2015 FINAL  Final  Culture, blood (Routine X 2) w Reflex to ID Panel     Status: None   Collection Time: 12/24/15  6:34 PM  Result Value Ref Range Status   Specimen Description BLOOD LEFT ANTECUBITAL  Final   Special Requests BOTTLES DRAWN AEROBIC AND ANAEROBIC 5CC  Final   Culture NO GROWTH 5 DAYS  Final   Report Status 12/29/2015 FINAL  Final     Time coordinating discharge: 45 minutes  SIGNED:   Tawni Millers, MD  [facility] 12/31/2015, 9:23 AM Pager   If 7PM-7AM, please contact night-coverage [email or web address] Password [email or web address]

## 2015-12-31 NOTE — Care Management Note (Signed)
Case Management Note  Patient Details  Name: [name] MRN: [id number] Date of Birth: 1955-08-21  Subjective/Objective:                    Action/Plan: Pt discharging home with self care. No further needs per CM.   Expected Discharge Date:  12/27/15               Expected Discharge Plan:  Home/Self Care  In-House Referral:     Discharge planning Services  CM Consult  Post Acute Care Choice:    Choice offered to:     DME Arranged:    DME Agency:     HH Arranged:    HH Agency:     Status of Service:  Completed, signed off  If discussed at [employer] of Stay Meetings, dates discussed:    Additional Comments:  Pollie Friar, RN 12/31/2015, 10:29 AM

## 2015-12-31 NOTE — Progress Notes (Signed)
Pt has been given all discharge instructions, pt understood. All rx given to pt.

## 2016-01-03 ENCOUNTER — Encounter: Payer: Managed Care, Other (non HMO) | Admitting: Neurology

## 2016-01-05 ENCOUNTER — Emergency Department (HOSPITAL_COMMUNITY): Payer: Managed Care, Other (non HMO)

## 2016-01-05 ENCOUNTER — Telehealth: Payer: Self-pay | Admitting: Neurology

## 2016-01-05 ENCOUNTER — Encounter (HOSPITAL_COMMUNITY): Payer: Self-pay | Admitting: Emergency Medicine

## 2016-01-05 DIAGNOSIS — T82897A Other specified complication of cardiac prosthetic devices, implants and grafts, initial encounter: Secondary | ICD-10-CM | POA: Diagnosis not present

## 2016-01-05 DIAGNOSIS — Z8673 Personal history of transient ischemic attack (TIA), and cerebral infarction without residual deficits: Secondary | ICD-10-CM | POA: Diagnosis not present

## 2016-01-05 DIAGNOSIS — E119 Type 2 diabetes mellitus without complications: Secondary | ICD-10-CM | POA: Diagnosis not present

## 2016-01-05 DIAGNOSIS — I10 Essential (primary) hypertension: Secondary | ICD-10-CM | POA: Insufficient documentation

## 2016-01-05 DIAGNOSIS — Z7982 Long term (current) use of aspirin: Secondary | ICD-10-CM | POA: Insufficient documentation

## 2016-01-05 DIAGNOSIS — Z79899 Other long term (current) drug therapy: Secondary | ICD-10-CM | POA: Insufficient documentation

## 2016-01-05 DIAGNOSIS — Y712 Prosthetic and other implants, materials and accessory cardiovascular devices associated with adverse incidents: Secondary | ICD-10-CM | POA: Diagnosis not present

## 2016-01-05 LAB — COMPREHENSIVE METABOLIC PANEL
ALBUMIN: 3.7 g/dL (ref 3.5–5.0)
ALT: 24 U/L (ref 17–63)
ANION GAP: 8 (ref 5–15)
AST: 19 U/L (ref 15–41)
Alkaline Phosphatase: 77 U/L (ref 38–126)
BILIRUBIN TOTAL: 0.4 mg/dL (ref 0.3–1.2)
BUN: 18 mg/dL (ref 6–20)
CO2: 25 mmol/L (ref 22–32)
Calcium: 8.8 mg/dL — ABNORMAL LOW (ref 8.9–10.3)
Chloride: 105 mmol/L (ref 101–111)
Creatinine, Ser: 0.9 mg/dL (ref 0.61–1.24)
GFR calc Af Amer: >60 mL/min (ref >60)
GFR calc non Af Amer: >60 mL/min (ref >60)
GLUCOSE: 98 mg/dL (ref 65–99)
POTASSIUM: 3.5 mmol/L (ref 3.5–5.1)
SODIUM: 138 mmol/L (ref 135–145)
TOTAL PROTEIN: 6.8 g/dL (ref 6.5–8.1)

## 2016-01-05 LAB — CBC WITH DIFFERENTIAL/PLATELET
BASOS ABS: 0.1 10*3/uL (ref 0.0–0.1)
BASOS PCT: 1 %
EOS ABS: 0.7 10*3/uL (ref 0.0–0.7)
Eosinophils Relative: 7 %
HEMATOCRIT: 42.5 % (ref 39.0–52.0)
HEMOGLOBIN: 14.1 g/dL (ref 13.0–17.0)
Lymphocytes Relative: 30 %
Lymphs Abs: 3.2 10*3/uL (ref 0.7–4.0)
MCH: 29.4 pg (ref 26.0–34.0)
MCHC: 33.2 g/dL (ref 30.0–36.0)
MCV: 88.5 fL (ref 78.0–100.0)
MONO ABS: 0.9 10*3/uL (ref 0.1–1.0)
Monocytes Relative: 8 %
NEUTROS ABS: 5.8 10*3/uL (ref 1.7–7.7)
NEUTROS PCT: 54 %
Platelets: 379 10*3/uL (ref 150–400)
RBC: 4.8 MIL/uL (ref 4.22–5.81)
RDW: 13.3 % (ref 11.5–15.5)
WBC: 10.7 10*3/uL — ABNORMAL HIGH (ref 4.0–10.5)

## 2016-01-05 LAB — I-STAT TROPONIN, ED: TROPONIN I, POC: 0 ng/mL (ref 0.00–0.08)

## 2016-01-05 NOTE — ED Triage Notes (Signed)
Pt. reported that his loop recorder is " vibratiing " today , denies chest pain or SOB . Denies emesis or diaphoresis .

## 2016-01-05 NOTE — Telephone Encounter (Signed)
I saw the pt in the hospital. He was Dr. Garth Bigness pt in the past and has appointment with Dr. Felecia Shelling on 02/21/16. I will do the disability form till 02/21/16 and he will see Dr. Felecia Shelling and then to further decide whether he should be continued with disability. Thanks.  Rosalin Hawking, MD PhD Stroke Neurology 01/05/2016 5:49 PM

## 2016-01-05 NOTE — Telephone Encounter (Signed)
Patient requesting a call back regarding [employer] forms being faxed over and if we have received them for the doctor to fill out. His best call back is [phone]

## 2016-01-05 NOTE — Telephone Encounter (Signed)
Patient is calling. He was discharged from [facility] on 12-31-15 and wants to discuss his treatment and short term disability.

## 2016-01-05 NOTE — Telephone Encounter (Signed)
Noted/fim 

## 2016-01-05 NOTE — Telephone Encounter (Signed)
I have spoken with [name] this afternoon.  As of this right now, I have not received any paperwork from [name].  He verbalized understanding of same/fim

## 2016-01-05 NOTE — Telephone Encounter (Signed)
I have spoken with pt. again--he sts. he will ask [employer] to refax disability paperwork.  He sts. he doesn't feel he should have to pay $50 form b/c he doesn't think [place] is asking for much info.  I have advised that once I receive forms, I will call him, but typical disability paperwork requires form fee to be paid/fim

## 2016-01-06 ENCOUNTER — Ambulatory Visit: Payer: Managed Care, Other (non HMO)

## 2016-01-06 ENCOUNTER — Emergency Department (HOSPITAL_COMMUNITY)
Admission: EM | Admit: 2016-01-06 | Discharge: 2016-01-06 | Disposition: A | Discharge status: Home / Self Care | Payer: Managed Care, Other (non HMO) | Attending: Emergency Medicine | Admitting: Emergency Medicine

## 2016-01-06 DIAGNOSIS — T829XXA Unspecified complication of cardiac and vascular prosthetic device, implant and graft, initial encounter: Secondary | ICD-10-CM

## 2016-01-06 NOTE — ED Provider Notes (Signed)
[facility] DEPT Provider Note   CSN: [id number] Arrival date & time: 01/05/16  2141  First Provider Contact:  First MD Initiated Contact with Patient 01/06/16 [phone]        History   Chief Complaint Chief Complaint  Patient presents with  . Other    Loop Recorder " vibrating".    HPI [name] is a 60 y.o. male.  Patient presents for feeling of "vibration" of his loop recorder This was implanted earlier this month He reports tonight he noted that everytime he was near interrogation device in his home, it felt as it would vibrate 10 seconds.  He could feel it move He reports mild nausea but no CP/SOB/weakness/syncope No falls/trauma No fever/vomiting His course is improving The onset was sudden, and it was intermittent   The history is provided by the patient.    Past Medical History:  Diagnosis Date  . Allergy   . Anxiety   . Cataract   . Cerebral hemorrhage ([place])   . Chest pain, atypical    12/2003:  negative cardiolyte  . Depression   . Drug abuse   . Eczema   . Erectile dysfunction   . History of meniscal tear    bilateral  . HLD (hyperlipidemia)   . HTN (hypertension)   . Hx of tear of ACL (anterior cruciate ligament)    right  . Insomnia   . Olecranon bursitis of left elbow 10/2009   s/p I&D by Dr Maxie Better, initially assessed by Dr. Nori Riis   . Prediabetes   . Stroke ([place])   . TIA (transient ischemic attack)     Patient Active Problem List   Diagnosis Date Noted  . Pain of upper abdomen   . Numbness   . Carpal tunnel syndrome, bilateral 12/16/2015  . Status post stroke 11/12/2015  . Transient neurologic deficit 10/15/2015  . Cerebrovascular accident, late effects 10/07/2015  . Complicated migraine   . Anxiety state   . 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 Cerebral infarction due to embolism of right middle cerebral artery ([place]) 09/23/2015  . Cerebral infarction due to embolism of cerebral artery ([place]) 09/23/2015  . Gait disturbance,  post-stroke   . Drooping of mouth   . Cerebrovascular accident (CVA) due to embolism of right anterior cerebral artery ([place])   . Benign essential HTN   . Migraine with aura and without status migrainosus, not intractable   . Tachypnea   . Prediabetes   . Acute blood loss anemia   . Brain aneurysm 09/20/2015  . Type 2 diabetes mellitus ([place]) 09/15/2015  . Leukocytosis 09/15/2015  . Aneurysm, cerebral, nonruptured 09/15/2015  . TIA (transient ischemic attack) 09/14/2015  . Temporary cerebral vascular dysfunction 09/14/2015  . H/O transient cerebral ischemia 07/29/2015  . At risk for falling 07/29/2015  . Bilateral hearing loss 10/25/2014  . Abnormal fear 08/08/2012  . Headache, migraine 08/08/2012  . Arthritis, degenerative 08/08/2012  . History of tear of ACL (anterior cruciate ligament) 03/08/2011  . History of knee problem 03/08/2011  . Depression 09/07/2010  . Depressive disorder, not elsewhere classified 09/07/2010  . URI (upper respiratory infection) 08/24/2010  . Hearing loss 09/06/2006  . Hyperlipidemia 04/23/2006  . Essential hypertension 04/23/2006  . INSOMNIA 04/23/2006    Past Surgical History:  Procedure Laterality Date  . EP IMPLANTABLE DEVICE N/A 12/30/2015   Procedure: Loop Recorder Insertion;  Surgeon: Thompson Grayer, MD;  Location: [facility];  Service: Cardiovascular;  Laterality: N/A;  . INCISE  AND DRAIN ABCESS     L elbow due to cellulitis/bursitis  . INNER EAR SURGERY    . IR GENERIC HISTORICAL  12/28/2015   IR ANGIO VERTEBRAL SEL VERTEBRAL UNI L MOD SED 12/28/2015 Luanne Bras, MD [facility]  . IR GENERIC HISTORICAL  12/28/2015   IR ANGIO VERTEBRAL SEL SUBCLAVIAN INNOMINATE UNI R MOD SED 12/28/2015 Luanne Bras, MD [facility]  . IR GENERIC HISTORICAL  12/28/2015   IR ANGIO INTRA EXTRACRAN SEL INTERNAL CAROTID BILAT MOD SED 12/28/2015 Luanne Bras, MD [facility]  . KNEE ARTHROSCOPY    . RADIOLOGY WITH ANESTHESIA N/A 09/20/2015    Procedure: EMBOLIZATION         (RADIOLOGY WITH ANESTHESIA);  Surgeon: Luanne Bras, MD;  Location: [facility];  Service: Radiology;  Laterality: N/A;  . SKIN TAG REMOVAL     11 removed  . TEE WITHOUT CARDIOVERSION N/A 12/30/2015   Procedure: TRANSESOPHAGEAL ECHOCARDIOGRAM (TEE);  Surgeon: Larey Dresser, MD;  Location: [facility];  Service: Cardiovascular;  Laterality: N/A;       Home Medications    Prior to Admission medications   Medication Sig Start Date End Date Taking? Authorizing Provider  amLODipine (NORVASC) 10 MG tablet Take 1 tablet (10 mg total) by mouth daily. 12/31/15   Mauricio Gerome Apley, MD  aspirin 325 MG EC tablet Take 1 tablet (325 mg total) by mouth daily. 12/31/15   Mauricio Gerome Apley, MD  atorvastatin (LIPITOR) 40 MG tablet Take 1 tablet (40 mg total) by mouth daily at 6 PM. 12/31/15   Mauricio Gerome Apley, MD  chlorthalidone (HYGROTON) 25 MG tablet Take 1 tablet (25 mg total) by mouth daily. 12/31/15   Mauricio Gerome Apley, MD  clopidogrel (PLAVIX) 75 MG tablet Take 1 tablet (75 mg total) by mouth daily. 09/30/15   Lavon Paganini Angiulli, PA-C  Elastic Bandages & Supports (WRIST SPLINT LEFT/RIGHT) MISC Bilateral cock-up wrist splints for carpal tunnel 12/16/15   Britt Bottom, MD  escitalopram (LEXAPRO) 20 MG tablet Take 1 tablet (20 mg total) by mouth daily. 12/31/15   Mauricio Gerome Apley, MD  fluticasone (FLONASE) 50 MCG/ACT nasal spray Place 2 sprays into both nostrils daily as needed for allergies. 12/31/15   Mauricio Gerome Apley, MD  guaiFENesin-dextromethorphan (ROBITUSSIN DM) 100-10 MG/5ML syrup Take 5 mLs by mouth every 8 (eight) hours as needed for cough. 12/31/15   Mauricio Gerome Apley, MD  lisinopril (PRINIVIL,ZESTRIL) 20 MG tablet Take 2 tablets (40 mg total) by mouth daily. 12/31/15   Mauricio Gerome Apley, MD  metoprolol (LOPRESSOR) 50 MG tablet Take 1 tablet (50 mg total) by mouth 2 (two) times daily. 12/31/15   Mauricio Gerome Apley, MD  triamcinolone  ointment (KENALOG) 0.1 % Apply 1 application topically See admin instructions. Apply twice daily to affected area for 1 week, then apply twice daily as needed for irritation/rash 12/31/15   Tawni Millers, MD    Family History Family History  Problem Relation Age of Onset  . Stroke Mother   . Hypertension Mother   . Aneurysm Mother 39    Died of brain aneursym  . Heart failure Father   . Emphysema Father   . Diabetes Mellitus II Sister   . Colon cancer Neg Hx   . Rectal cancer Neg Hx   . Stomach cancer Neg Hx     Social History Social History  Substance Use Topics  . Smoking status: Never Smoker  . Smokeless tobacco: Never Used  . Alcohol use No  Allergies   Review of patient's allergies indicates no known allergies.   Review of Systems Review of Systems  Constitutional: Negative for fever.  Respiratory: Negative for shortness of breath.   Cardiovascular: Negative for chest pain.  Gastrointestinal: Positive for nausea.  Neurological: Negative for syncope.  All other systems reviewed and are negative.    Physical Exam Updated Vital Signs BP 149/90   Pulse 70   Temp 98.4 F (36.9 C) (Oral)   Resp 18   Ht 5\' 8"  (1.727 m)   Wt 113.4 kg   SpO2 96%   BMI 38.01 kg/m   Physical Exam CONSTITUTIONAL: Well developed/well nourished HEAD: Normocephalic/atraumatic ENMT: Mucous membranes moist NECK: supple no meningeal signs SPINE/BACK:entire spine nontender CV: S1/S2 noted, no murmurs/rubs/gallops noted LUNGS: Lungs are clear to auscultation bilaterally, no apparent distress Chest - mild bruising to anterior chest, but no erythema/drainage noted.  No tenderness to chest wall is noted ABDOMEN: soft, nontender NEURO: Pt is awake/alert/appropriate, moves all extremitiesx4.  No facial droop.   EXTREMITIES: full ROM SKIN: warm, color normal PSYCH: no abnormalities of mood noted, alert and oriented to situation   ED Treatments / Results  Labs (all labs  ordered are listed, but only abnormal results are displayed) Labs Reviewed  CBC WITH DIFFERENTIAL/PLATELET - Abnormal; Notable for the following:       Result Value   WBC 10.7 (*)    All other components within normal limits  COMPREHENSIVE METABOLIC PANEL - Abnormal; Notable for the following:    Calcium 8.8 (*)    All other components within normal limits  I-STAT TROPOININ, ED    EKG  EKG Interpretation  Date/Time:  Wednesday January 05 2016 21:49:24 EDT Ventricular Rate:  67 PR Interval:  156 QRS Duration: 92 QT Interval:  450 QTC Calculation: 475 R Axis:   50 Text Interpretation:  Normal sinus rhythm Normal ECG No significant change since last tracing Confirmed by Christy Gentles  MD, Cleatus Goodin ([id number]) on 01/06/2016 5:09:16 AM       Radiology Dg Chest 2 View  Result Date: 01/05/2016 CLINICAL DATA:  Chest pain in region of loop recorder. EXAM: CHEST  2 VIEW COMPARISON:  12/24/2015 FINDINGS: The cardiomediastinal silhouette is unremarkable. Mild peribronchial thickening is unchanged. A loop recorder overlying the lower left chest is identified. There is no evidence of focal airspace disease, pulmonary edema, suspicious pulmonary nodule/mass, pleural effusion, or pneumothorax. No acute bony abnormalities are identified. IMPRESSION: No evidence of acute cardiopulmonary disease. Loop recorder overlying the lower left chest. Electronically Signed   By: Margarette Canada M.D.   On: 01/05/2016 22:25    Procedures Procedures (including critical care time)  Medications Ordered in ED Medications - No data to display   Initial Impression / Assessment and Plan / ED Course  I have reviewed the triage vital signs and the nursing notes.  Pertinent labs & imaging results that were available during my care of the patient were reviewed by me and considered in my medical decision making (see chart for details).  Clinical Course    Pt well appearing He recently had loop recorder implanted now with  feeling of movement of recorder Interrogation in the ED does not reveal any acute events All other labs/imaging unremarkable EKG is reassuring I feel he is appropriate for d/c home   Final Clinical Impressions(s) / ED Diagnoses   Final diagnoses:  Cardiac device, implant, or graft complication, initial encounter    New Prescriptions Discharge Medication List as of 01/06/2016  5:23 AM       Ripley Fraise, MD 01/06/16 [phone]

## 2016-01-06 NOTE — ED Notes (Signed)
Called Woody, Agricultural consultant, to perform interrogation of pt's loop recorder.

## 2016-01-06 NOTE — ED Notes (Signed)
Pt departed in NAD, refused use of wheelchair.  

## 2016-01-10 ENCOUNTER — Telehealth: Payer: Self-pay | Admitting: Neurology

## 2016-01-10 NOTE — Telephone Encounter (Signed)
I have spoken with [name] this morning and offered appt. with RAS tomorrow am.  He declined, stating he is in [place] and will not be back until 01-12-16 am.  Appt. given 01-12-16/fim

## 2016-01-10 NOTE — Telephone Encounter (Signed)
Pt called said he is having a lot of dizziness for the past 4 days increasing yesterday. Please call

## 2016-01-10 NOTE — Telephone Encounter (Signed)
error 

## 2016-01-12 ENCOUNTER — Encounter: Payer: Self-pay | Admitting: Internal Medicine

## 2016-01-12 ENCOUNTER — Ambulatory Visit: Payer: Self-pay | Admitting: Neurology

## 2016-01-21 ENCOUNTER — Telehealth: Payer: Self-pay | Admitting: Cardiology

## 2016-01-21 NOTE — Telephone Encounter (Signed)
Attempted to call pt about home monitor not updating. No answer and unable to leave a message.

## 2016-02-01 ENCOUNTER — Ambulatory Visit (INDEPENDENT_AMBULATORY_CARE_PROVIDER_SITE_OTHER): Payer: Managed Care, Other (non HMO) | Admitting: *Deleted

## 2016-02-01 DIAGNOSIS — I639 Cerebral infarction, unspecified: Secondary | ICD-10-CM | POA: Diagnosis not present

## 2016-02-02 ENCOUNTER — Other Ambulatory Visit: Payer: Self-pay | Admitting: Physical Medicine & Rehabilitation

## 2016-02-02 NOTE — Progress Notes (Signed)
Carelink Summary Report / Loop Recorder 

## 2016-02-14 ENCOUNTER — Encounter: Payer: Self-pay | Admitting: Internal Medicine

## 2016-02-14 ENCOUNTER — Ambulatory Visit (INDEPENDENT_AMBULATORY_CARE_PROVIDER_SITE_OTHER): Payer: Managed Care, Other (non HMO) | Admitting: *Deleted

## 2016-02-14 DIAGNOSIS — Z95818 Presence of other cardiac implants and grafts: Secondary | ICD-10-CM

## 2016-02-14 DIAGNOSIS — I639 Cerebral infarction, unspecified: Secondary | ICD-10-CM

## 2016-02-14 LAB — CUP PACEART INCLINIC DEVICE CHECK: Date Time Interrogation Session: 20170918175701

## 2016-02-14 NOTE — Progress Notes (Signed)
Wound check appointment, s/p ILR implant on 12/30/15. Steri-strips previously removed by patient. Wound without redness or edema. Incision edges approximated, wound well healed. Normal device function. Battery status: good. R-waves 0.43mV. No symptom, tachy, or AF episodes. Pause and brady detection off (no history of syncope). Patient instructed on use of home monitor. Monthly summary reports and ROV with JA PRN.

## 2016-02-15 ENCOUNTER — Encounter: Payer: Managed Care, Other (non HMO) | Admitting: Neurology

## 2016-02-16 ENCOUNTER — Encounter: Payer: Self-pay | Admitting: Neurology

## 2016-02-21 ENCOUNTER — Ambulatory Visit: Payer: Managed Care, Other (non HMO) | Admitting: Neurology

## 2016-02-25 LAB — CUP PACEART REMOTE DEVICE CHECK: MDC IDC SESS DTM: 20170902210541

## 2016-02-25 NOTE — Progress Notes (Signed)
Carelink summary report received. Battery status OK. Normal device function. No new symptom episodes, tachy episodes, brady, or pause episodes. No new AF episodes. Monthly summary reports and ROV/PRN 

## 2016-02-28 ENCOUNTER — Ambulatory Visit (INDEPENDENT_AMBULATORY_CARE_PROVIDER_SITE_OTHER): Payer: Managed Care, Other (non HMO) | Admitting: *Deleted

## 2016-02-28 DIAGNOSIS — I639 Cerebral infarction, unspecified: Secondary | ICD-10-CM | POA: Diagnosis not present

## 2016-03-01 ENCOUNTER — Encounter: Payer: Self-pay | Admitting: Neurology

## 2016-03-01 NOTE — Progress Notes (Signed)
Carelink Summary Report / Loop Recorder 

## 2016-03-23 ENCOUNTER — Emergency Department (HOSPITAL_COMMUNITY): Payer: Managed Care, Other (non HMO)

## 2016-03-23 ENCOUNTER — Emergency Department (HOSPITAL_COMMUNITY)
Admission: EM | Admit: 2016-03-23 | Discharge: 2016-03-23 | Disposition: A | Discharge status: Home / Self Care | Payer: Managed Care, Other (non HMO) | Attending: Emergency Medicine | Admitting: Emergency Medicine

## 2016-03-23 ENCOUNTER — Encounter (HOSPITAL_COMMUNITY): Payer: Self-pay | Admitting: *Deleted

## 2016-03-23 DIAGNOSIS — Z7982 Long term (current) use of aspirin: Secondary | ICD-10-CM | POA: Insufficient documentation

## 2016-03-23 DIAGNOSIS — Z79899 Other long term (current) drug therapy: Secondary | ICD-10-CM | POA: Diagnosis not present

## 2016-03-23 DIAGNOSIS — Z8673 Personal history of transient ischemic attack (TIA), and cerebral infarction without residual deficits: Secondary | ICD-10-CM | POA: Diagnosis not present

## 2016-03-23 DIAGNOSIS — I10 Essential (primary) hypertension: Secondary | ICD-10-CM | POA: Insufficient documentation

## 2016-03-23 DIAGNOSIS — R51 Headache: Secondary | ICD-10-CM | POA: Diagnosis present

## 2016-03-23 DIAGNOSIS — R519 Headache, unspecified: Secondary | ICD-10-CM

## 2016-03-23 DIAGNOSIS — E119 Type 2 diabetes mellitus without complications: Secondary | ICD-10-CM | POA: Diagnosis not present

## 2016-03-23 MED ORDER — KETOROLAC TROMETHAMINE 30 MG/ML IJ SOLN
30.0000 mg | Freq: Once | INTRAMUSCULAR | Status: AC
  Administered 2016-03-23: 30 mg via INTRAVENOUS
  Filled 2016-03-23: qty 1

## 2016-03-23 MED ORDER — DIPHENHYDRAMINE HCL 50 MG/ML IJ SOLN
25.0000 mg | Freq: Once | INTRAMUSCULAR | Status: AC
  Administered 2016-03-23: 25 mg via INTRAVENOUS
  Filled 2016-03-23: qty 1

## 2016-03-23 MED ORDER — PROCHLORPERAZINE EDISYLATE 5 MG/ML IJ SOLN
10.0000 mg | Freq: Once | INTRAMUSCULAR | Status: AC
  Administered 2016-03-23: 10 mg via INTRAVENOUS
  Filled 2016-03-23: qty 2

## 2016-03-23 MED ORDER — SODIUM CHLORIDE 0.9 % IV BOLUS (SEPSIS)
1000.0000 mL | Freq: Once | INTRAVENOUS | Status: AC
  Administered 2016-03-23: 1000 mL via INTRAVENOUS

## 2016-03-23 NOTE — Discharge Instructions (Signed)
Please follow-up with your doctor to make them aware of today's headache. They may want to change/add to your medications. Please return to the emergency department if you develop any new or worsening symptoms.

## 2016-03-23 NOTE — ED Notes (Signed)
Patient transported to CT 

## 2016-03-23 NOTE — ED Notes (Signed)
Patient left at this time with all belongings. 

## 2016-03-23 NOTE — ED Triage Notes (Signed)
Pt arrives from home with c/o h/a and nausea since 0930 this morning. Home health RN states that pt systolic BP > [id number] and MD advised pt call 911. Pt has a hx of HTN and CVAx4.

## 2016-03-23 NOTE — ED Provider Notes (Signed)
[facility] DEPT Provider Note   CSN: [id number] Arrival date & time: 03/23/16  1714     History   Chief Complaint Chief Complaint  Patient presents with  . Headache  . Nausea    HPI [name] is a 60 y.o. male with history of stroke (x4), migraine, hypertension who presents with a headache that began around 10 AM today. Patient reports that he can gradually. The headache is located behind his eyes and is throbbing. Patient reports numbness in his right fingers that occurred yesterday, but is now resolved. Patient had one episode of vomiting is felt nauseated throughout the day since onset. The patient's home health nurse came around 2 PM today and the patient had a blood pressure of 203/100. Patient denies any change in vision, numbness, weakness, chest pain, abdominal pain, urinary symptoms. Patient reports he has had a headache with past stroke in aneurysm. He states that this feels different. Patient has not taken any medications for his headache at home.  HPI  Past Medical History:  Diagnosis Date  . Allergy   . Anxiety   . Cataract   . Cerebral hemorrhage ([place])   . Chest pain, atypical    12/2003:  negative cardiolyte  . Depression   . Drug abuse   . Eczema   . Erectile dysfunction   . History of meniscal tear    bilateral  . HLD (hyperlipidemia)   . HTN (hypertension)   . Hx of tear of ACL (anterior cruciate ligament)    right  . Insomnia   . Olecranon bursitis of left elbow 10/2009   s/p I&D by Dr Maxie Better, initially assessed by Dr. Nori Riis   . Prediabetes   . Stroke ([place])   . TIA (transient ischemic attack)     Patient Active Problem List   Diagnosis Date Noted  . Pain of upper abdomen   . Numbness   . Carpal tunnel syndrome, bilateral 12/16/2015  . Status post stroke 11/12/2015  . Transient neurologic deficit 10/15/2015  . Cerebrovascular accident, late effects 10/07/2015  . Complicated migraine   . Anxiety state   . Middle cerebral aneurysm  09/23/2015  . Cerebral infarction due to embolism of right middle cerebral artery ([place]) 09/23/2015  . Cerebral infarction due to embolism of cerebral artery ([place]) 09/23/2015  . Gait disturbance, post-stroke   . Drooping of mouth   . Cerebrovascular accident (CVA) due to embolism of right anterior cerebral artery ([place])   . Benign essential HTN   . Migraine with aura and without status migrainosus, not intractable   . Tachypnea   . Prediabetes   . Acute blood loss anemia   . Brain aneurysm 09/20/2015  . Type 2 diabetes mellitus ([place]) 09/15/2015  . Leukocytosis 09/15/2015  . Aneurysm, cerebral, nonruptured 09/15/2015  . TIA (transient ischemic attack) 09/14/2015  . Temporary cerebral vascular dysfunction 09/14/2015  . H/O transient cerebral ischemia 07/29/2015  . At risk for falling 07/29/2015  . Bilateral hearing loss 10/25/2014  . Abnormal fear 08/08/2012  . Headache, migraine 08/08/2012  . Arthritis, degenerative 08/08/2012  . History of tear of ACL (anterior cruciate ligament) 03/08/2011  . History of knee problem 03/08/2011  . Depression 09/07/2010  . Depressive disorder, not elsewhere classified 09/07/2010  . URI (upper respiratory infection) 08/24/2010  . Hearing loss 09/06/2006  . Hyperlipidemia 04/23/2006  . Essential hypertension 04/23/2006  . INSOMNIA 04/23/2006    Past Surgical History:  Procedure Laterality Date  . EP IMPLANTABLE DEVICE N/A 12/30/2015  Procedure: Loop Recorder Insertion;  Surgeon: Thompson Grayer, MD;  Location: [facility];  Service: Cardiovascular;  Laterality: N/A;  . INCISE AND DRAIN ABCESS     L elbow due to cellulitis/bursitis  . INNER EAR SURGERY    . IR GENERIC HISTORICAL  12/28/2015   IR ANGIO VERTEBRAL SEL VERTEBRAL UNI L MOD SED 12/28/2015 Luanne Bras, MD [facility]  . IR GENERIC HISTORICAL  12/28/2015   IR ANGIO VERTEBRAL SEL SUBCLAVIAN INNOMINATE UNI R MOD SED 12/28/2015 Luanne Bras, MD [facility]  . IR GENERIC  HISTORICAL  12/28/2015   IR ANGIO INTRA EXTRACRAN SEL INTERNAL CAROTID BILAT MOD SED 12/28/2015 Luanne Bras, MD [facility]  . KNEE ARTHROSCOPY    . RADIOLOGY WITH ANESTHESIA N/A 09/20/2015   Procedure: EMBOLIZATION         (RADIOLOGY WITH ANESTHESIA);  Surgeon: Luanne Bras, MD;  Location: [facility];  Service: Radiology;  Laterality: N/A;  . SKIN TAG REMOVAL     11 removed  . TEE WITHOUT CARDIOVERSION N/A 12/30/2015   Procedure: TRANSESOPHAGEAL ECHOCARDIOGRAM (TEE);  Surgeon: Larey Dresser, MD;  Location: [facility];  Service: Cardiovascular;  Laterality: N/A;       Home Medications    Prior to Admission medications   Medication Sig Start Date End Date Taking? Authorizing Provider  amLODipine (NORVASC) 10 MG tablet Take 1 tablet (10 mg total) by mouth daily. 12/31/15   Mauricio Gerome Apley, MD  aspirin 325 MG EC tablet Take 1 tablet (325 mg total) by mouth daily. 12/31/15   Mauricio Gerome Apley, MD  atorvastatin (LIPITOR) 40 MG tablet Take 1 tablet (40 mg total) by mouth daily at 6 PM. 12/31/15   Mauricio Gerome Apley, MD  chlorthalidone (HYGROTON) 25 MG tablet Take 1 tablet (25 mg total) by mouth daily. 12/31/15   Mauricio Gerome Apley, MD  clopidogrel (PLAVIX) 75 MG tablet Take 1 tablet (75 mg total) by mouth daily. 09/30/15   Lavon Paganini Angiulli, PA-C  Elastic Bandages & Supports (WRIST SPLINT LEFT/RIGHT) MISC Bilateral cock-up wrist splints for carpal tunnel 12/16/15   Britt Bottom, MD  escitalopram (LEXAPRO) 20 MG tablet Take 1 tablet (20 mg total) by mouth daily. 12/31/15   Mauricio Gerome Apley, MD  fluticasone (FLONASE) 50 MCG/ACT nasal spray Place 2 sprays into both nostrils daily as needed for allergies. 12/31/15   Mauricio Gerome Apley, MD  guaiFENesin-dextromethorphan (ROBITUSSIN DM) 100-10 MG/5ML syrup Take 5 mLs by mouth every 8 (eight) hours as needed for cough. 12/31/15   Mauricio Gerome Apley, MD  lisinopril (PRINIVIL,ZESTRIL) 20 MG tablet Take 2 tablets (40 mg total) by  mouth daily. 12/31/15   Mauricio Gerome Apley, MD  metoprolol (LOPRESSOR) 50 MG tablet Take 1 tablet (50 mg total) by mouth 2 (two) times daily. 12/31/15   Mauricio Gerome Apley, MD  triamcinolone ointment (KENALOG) 0.1 % Apply 1 application topically See admin instructions. Apply twice daily to affected area for 1 week, then apply twice daily as needed for irritation/rash 12/31/15   Tawni Millers, MD    Family History Family History  Problem Relation Age of Onset  . Stroke Mother   . Hypertension Mother   . Aneurysm Mother 51    Died of brain aneursym  . Heart failure Father   . Emphysema Father   . Diabetes Mellitus II Sister   . Colon cancer Neg Hx   . Rectal cancer Neg Hx   . Stomach cancer Neg Hx     Social  History Social History  Substance Use Topics  . Smoking status: Never Smoker  . Smokeless tobacco: Never Used  . Alcohol use No     Allergies   Review of patient's allergies indicates no known allergies.   Review of Systems Review of Systems  Constitutional: Negative for chills and fever.  HENT: Negative for facial swelling and sore throat.   Respiratory: Negative for shortness of breath.   Cardiovascular: Negative for chest pain.  Gastrointestinal: Positive for nausea and vomiting. Negative for abdominal pain.  Genitourinary: Negative for dysuria.  Musculoskeletal: Negative for back pain.  Skin: Negative for rash and wound.  Neurological: Positive for dizziness (chronic vertigo) and headaches.  Psychiatric/Behavioral: The patient is not nervous/anxious.      Physical Exam Updated Vital Signs BP 128/68   Pulse 81   Temp 98.5 F (36.9 C) (Oral)   Resp 17   Ht 5\' 8"  (1.727 m)   Wt 117.9 kg   SpO2 93%   BMI 39.53 kg/m   Physical Exam  Constitutional: He appears well-developed and well-nourished. No distress.  HENT:  Head: Normocephalic and atraumatic.  Mouth/Throat: Oropharynx is clear and moist. No oropharyngeal exudate.  Eyes: Conjunctivae  and EOM are normal. Pupils are equal, round, and reactive to light. Right eye exhibits no discharge. Left eye exhibits no discharge. No scleral icterus.  Neck: Normal range of motion. Neck supple. No thyromegaly present.  Cardiovascular: Normal rate, regular rhythm, normal heart sounds and intact distal pulses.  Exam reveals no gallop and no friction rub.   No murmur heard. Pulmonary/Chest: Effort normal and breath sounds normal. No stridor. No respiratory distress. He has no wheezes. He has no rales.  Abdominal: Soft. Bowel sounds are normal. He exhibits no distension. There is no tenderness. There is no rebound and no guarding.  Musculoskeletal: He exhibits no edema.  Lymphadenopathy:    He has no cervical adenopathy.  Neurological: He is alert. Coordination normal.  CN 3-12 intact; normal sensation throughout; 5/5 strength in all 4 extremities; equal bilateral grip strength; no ataxia on finger to nose; heel to shin test normal   Skin: Skin is warm and dry. No rash noted. He is not diaphoretic. No pallor.  Psychiatric: He has a normal mood and affect.  Nursing note and vitals reviewed.    ED Treatments / Results  Labs (all labs ordered are listed, but only abnormal results are displayed) Labs Reviewed - No data to display  EKG  EKG Interpretation None       Radiology Ct Head Wo Contrast  Result Date: 03/23/2016 CLINICAL DATA:  Frontal headache starting today. EXAM: CT HEAD WITHOUT CONTRAST TECHNIQUE: Contiguous axial images were obtained from the base of the skull through the vertex without intravenous contrast. 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 Minimal small vessel ischemic changes of periventricular white matter. No acute large vascular territory infarction, hemorrhage or midline shift. Ventricles are normal size, shape and configuration. Basal cisterns are midline. Vascular: No hyperdense vessel or  unexpected calcification. Pericallosal pipeline stent is unchanged in appearance. Skull: Normal. Negative for fracture or focal lesion. Sinuses/Orbits: Mucous retention cysts noted of both maxillary sinuses. Moderate ethmoid sinus mucosal opacification more so on the left. Sphenoid and frontal sinuses appear clear. The orbits and globes are normal in appearance. Other: Left mastoidectomy. IMPRESSION: Chronic stable small vessel ischemic change of periventricular white matter. Moderate ethmoid sinus mucosal thickening. Maxillary mucous retention cyst noted bilaterally.  Stable pericallosal aneurysm with pipeline stent. Electronically Signed   By: Ashley Royalty M.D.   On: 03/23/2016 19:18    Procedures Procedures (including critical care time)  Medications Ordered in ED Medications  sodium chloride 0.9 % bolus 1,000 mL (0 mLs Intravenous Stopped 03/23/16 1941)  ketorolac (TORADOL) 30 MG/ML injection 30 mg (30 mg Intravenous Given 03/23/16 1824)  prochlorperazine (COMPAZINE) injection 10 mg (10 mg Intravenous Given 03/23/16 1824)  diphenhydrAMINE (BENADRYL) injection 25 mg (25 mg Intravenous Given 03/23/16 1824)     Initial Impression / Assessment and Plan / ED Course  I have reviewed the triage vital signs and the nursing notes.  Pertinent labs & imaging results that were available during my care of the patient were reviewed by me and considered in my medical decision making (see chart for details).  Clinical Course    CT head pending. Treat patient's headache with 1 L fluid bolus, Toradol, Compazine, Benadryl and reevaluate.  On reevaluation, the patient's headache is resolved after medications above. I suspect headache was a migraine. Normal neuro exam, no focal deficits. CT head shows chronic stable small vessel ischemic changes of periventricular white matter; moderate ethmoid sinus mucosal thickening, maxillary mucous retention cyst noted bilaterally. Stable pericallosal aneurysm with  pipeline stent. Patient advised to follow up with PCP about today's headache. Patient understands and agrees with plan. Patient stable throughout ED course and discharged in satisfactory condition.  Final Clinical Impressions(s) / ED Diagnoses   Final diagnoses:  Acute nonintractable headache, unspecified headache type    New Prescriptions New Prescriptions   No medications on file     Frederica Kuster, Hershal Coria 03/23/16 1959    Virgel Manifold, MD 03/26/16 [phone]

## 2016-03-29 ENCOUNTER — Ambulatory Visit (INDEPENDENT_AMBULATORY_CARE_PROVIDER_SITE_OTHER): Payer: Managed Care, Other (non HMO) | Admitting: *Deleted

## 2016-03-29 DIAGNOSIS — I639 Cerebral infarction, unspecified: Secondary | ICD-10-CM

## 2016-03-30 NOTE — Progress Notes (Signed)
Carelink Summary Report / Loop Recorder 

## 2016-04-07 LAB — CUP PACEART REMOTE DEVICE CHECK
Date Time Interrogation Session: 20171002211220
Implantable Pulse Generator Implant Date: 20170803

## 2016-04-07 NOTE — Progress Notes (Signed)
Carelink summary report received. Battery status OK. Normal device function. No new symptom episodes, tachy episodes, brady, or pause episodes. No new AF episodes. Monthly summary reports and ROV/PRN 

## 2016-04-28 ENCOUNTER — Ambulatory Visit (INDEPENDENT_AMBULATORY_CARE_PROVIDER_SITE_OTHER): Payer: Managed Care, Other (non HMO) | Admitting: *Deleted

## 2016-04-28 DIAGNOSIS — I639 Cerebral infarction, unspecified: Secondary | ICD-10-CM | POA: Diagnosis not present

## 2016-05-01 NOTE — Progress Notes (Signed)
Carelink Summary Report / Loop Recorder 

## 2016-05-06 LAB — CUP PACEART REMOTE DEVICE CHECK
Date Time Interrogation Session: 20171101213515
MDC IDC PG IMPLANT DT: 20170803

## 2016-05-06 NOTE — Progress Notes (Signed)
Carelink summary report received. Battery status OK. Normal device function. No new symptom episodes, tachy episodes, brady, or pause episodes. No new AF episodes. Monthly summary reports and ROV/PRN 

## 2016-05-30 ENCOUNTER — Ambulatory Visit (INDEPENDENT_AMBULATORY_CARE_PROVIDER_SITE_OTHER): Payer: Managed Care, Other (non HMO) | Admitting: *Deleted

## 2016-05-30 DIAGNOSIS — I639 Cerebral infarction, unspecified: Secondary | ICD-10-CM

## 2016-05-31 NOTE — Progress Notes (Signed)
[facility] Summary Report 

## 2016-06-07 LAB — CUP PACEART REMOTE DEVICE CHECK
Date Time Interrogation Session: 20171201223519
MDC IDC PG IMPLANT DT: 20170803

## 2016-06-15 ENCOUNTER — Observation Stay (HOSPITAL_COMMUNITY): Payer: Managed Care, Other (non HMO)

## 2016-06-15 ENCOUNTER — Encounter (HOSPITAL_COMMUNITY): Payer: Self-pay | Admitting: Radiology

## 2016-06-15 ENCOUNTER — Observation Stay (HOSPITAL_COMMUNITY)
Admission: EM | Admit: 2016-06-15 | Discharge: 2016-06-16 | Disposition: A | Discharge status: Home / Self Care | Payer: Managed Care, Other (non HMO) | Attending: Internal Medicine | Admitting: Internal Medicine

## 2016-06-15 ENCOUNTER — Observation Stay (HOSPITAL_BASED_OUTPATIENT_CLINIC_OR_DEPARTMENT_OTHER)
Admit: 2016-06-15 | Discharge: 2016-06-15 | Disposition: A | Discharge status: Home / Self Care | Payer: Managed Care, Other (non HMO) | Attending: Internal Medicine | Admitting: Internal Medicine

## 2016-06-15 ENCOUNTER — Emergency Department (HOSPITAL_COMMUNITY): Payer: Managed Care, Other (non HMO)

## 2016-06-15 DIAGNOSIS — E042 Nontoxic multinodular goiter: Secondary | ICD-10-CM | POA: Diagnosis not present

## 2016-06-15 DIAGNOSIS — I10 Essential (primary) hypertension: Secondary | ICD-10-CM | POA: Insufficient documentation

## 2016-06-15 DIAGNOSIS — I6523 Occlusion and stenosis of bilateral carotid arteries: Secondary | ICD-10-CM | POA: Diagnosis not present

## 2016-06-15 DIAGNOSIS — Z7902 Long term (current) use of antithrombotics/antiplatelets: Secondary | ICD-10-CM | POA: Diagnosis not present

## 2016-06-15 DIAGNOSIS — F411 Generalized anxiety disorder: Secondary | ICD-10-CM | POA: Insufficient documentation

## 2016-06-15 DIAGNOSIS — G4733 Obstructive sleep apnea (adult) (pediatric): Secondary | ICD-10-CM | POA: Insufficient documentation

## 2016-06-15 DIAGNOSIS — F329 Major depressive disorder, single episode, unspecified: Secondary | ICD-10-CM | POA: Insufficient documentation

## 2016-06-15 DIAGNOSIS — G4700 Insomnia, unspecified: Secondary | ICD-10-CM | POA: Insufficient documentation

## 2016-06-15 DIAGNOSIS — Z6841 Body Mass Index (BMI) 40.0 and over, adult: Secondary | ICD-10-CM | POA: Insufficient documentation

## 2016-06-15 DIAGNOSIS — G43009 Migraine without aura, not intractable, without status migrainosus: Secondary | ICD-10-CM

## 2016-06-15 DIAGNOSIS — R2689 Other abnormalities of gait and mobility: Secondary | ICD-10-CM | POA: Diagnosis not present

## 2016-06-15 DIAGNOSIS — R531 Weakness: Principal | ICD-10-CM | POA: Insufficient documentation

## 2016-06-15 DIAGNOSIS — G43909 Migraine, unspecified, not intractable, without status migrainosus: Secondary | ICD-10-CM | POA: Diagnosis not present

## 2016-06-15 DIAGNOSIS — I639 Cerebral infarction, unspecified: Secondary | ICD-10-CM

## 2016-06-15 DIAGNOSIS — Z8679 Personal history of other diseases of the circulatory system: Secondary | ICD-10-CM | POA: Insufficient documentation

## 2016-06-15 DIAGNOSIS — F32A Depression, unspecified: Secondary | ICD-10-CM | POA: Diagnosis present

## 2016-06-15 DIAGNOSIS — Z7982 Long term (current) use of aspirin: Secondary | ICD-10-CM | POA: Insufficient documentation

## 2016-06-15 DIAGNOSIS — G459 Transient cerebral ischemic attack, unspecified: Secondary | ICD-10-CM | POA: Diagnosis not present

## 2016-06-15 DIAGNOSIS — E785 Hyperlipidemia, unspecified: Secondary | ICD-10-CM | POA: Diagnosis not present

## 2016-06-15 DIAGNOSIS — M47812 Spondylosis without myelopathy or radiculopathy, cervical region: Secondary | ICD-10-CM | POA: Diagnosis not present

## 2016-06-15 DIAGNOSIS — I69398 Other sequelae of cerebral infarction: Secondary | ICD-10-CM | POA: Insufficient documentation

## 2016-06-15 DIAGNOSIS — E119 Type 2 diabetes mellitus without complications: Secondary | ICD-10-CM | POA: Diagnosis not present

## 2016-06-15 DIAGNOSIS — R911 Solitary pulmonary nodule: Secondary | ICD-10-CM | POA: Insufficient documentation

## 2016-06-15 DIAGNOSIS — R269 Unspecified abnormalities of gait and mobility: Secondary | ICD-10-CM

## 2016-06-15 DIAGNOSIS — I671 Cerebral aneurysm, nonruptured: Secondary | ICD-10-CM | POA: Diagnosis present

## 2016-06-15 LAB — I-STAT CHEM 8, ED
BUN: 22 mg/dL — AB (ref 6–20)
Calcium, Ion: 1.07 mmol/L — ABNORMAL LOW (ref 1.15–1.40)
Chloride: 99 mmol/L — ABNORMAL LOW (ref 101–111)
Creatinine, Ser: 0.9 mg/dL (ref 0.61–1.24)
GLUCOSE: 277 mg/dL — AB (ref 65–99)
HEMATOCRIT: 43 % (ref 39.0–52.0)
Hemoglobin: 14.6 g/dL (ref 13.0–17.0)
POTASSIUM: 3.7 mmol/L (ref 3.5–5.1)
Sodium: 138 mmol/L (ref 135–145)
TCO2: 25 mmol/L (ref 0–100)

## 2016-06-15 LAB — I-STAT TROPONIN, ED: TROPONIN I, POC: 0.01 ng/mL (ref 0.00–0.08)

## 2016-06-15 LAB — COMPREHENSIVE METABOLIC PANEL
ALBUMIN: 3.6 g/dL (ref 3.5–5.0)
ALT: 38 U/L (ref 17–63)
AST: 29 U/L (ref 15–41)
Alkaline Phosphatase: 89 U/L (ref 38–126)
Anion gap: 11 (ref 5–15)
BILIRUBIN TOTAL: 0.6 mg/dL (ref 0.3–1.2)
BUN: 18 mg/dL (ref 6–20)
CHLORIDE: 101 mmol/L (ref 101–111)
CO2: 24 mmol/L (ref 22–32)
CREATININE: 0.98 mg/dL (ref 0.61–1.24)
Calcium: 9.2 mg/dL (ref 8.9–10.3)
GFR calc Af Amer: >60 mL/min (ref >60)
GLUCOSE: 278 mg/dL — AB (ref 65–99)
POTASSIUM: 3.6 mmol/L (ref 3.5–5.1)
Sodium: 136 mmol/L (ref 135–145)
TOTAL PROTEIN: 7 g/dL (ref 6.5–8.1)

## 2016-06-15 LAB — DIFFERENTIAL
BASOS ABS: 0.1 10*3/uL (ref 0.0–0.1)
Basophils Relative: 1 %
EOS ABS: 0.5 10*3/uL (ref 0.0–0.7)
Eosinophils Relative: 4 %
LYMPHS ABS: 2.4 10*3/uL (ref 0.7–4.0)
Lymphocytes Relative: 20 %
MONOS PCT: 5 %
Monocytes Absolute: 0.6 10*3/uL (ref 0.1–1.0)
NEUTROS ABS: 8.6 10*3/uL — AB (ref 1.7–7.7)
NEUTROS PCT: 70 %

## 2016-06-15 LAB — CBC
HEMATOCRIT: 41.8 % (ref 39.0–52.0)
HEMOGLOBIN: 14.1 g/dL (ref 13.0–17.0)
MCH: 28.9 pg (ref 26.0–34.0)
MCHC: 33.7 g/dL (ref 30.0–36.0)
MCV: 85.7 fL (ref 78.0–100.0)
Platelets: 367 10*3/uL (ref 150–400)
RBC: 4.88 MIL/uL (ref 4.22–5.81)
RDW: 13.8 % (ref 11.5–15.5)
WBC: 12.2 10*3/uL — AB (ref 4.0–10.5)

## 2016-06-15 LAB — PROTIME-INR
INR: 0.98
Prothrombin Time: 13 seconds (ref 11.4–15.2)

## 2016-06-15 LAB — APTT: APTT: 24 s (ref 24–36)

## 2016-06-15 MED ORDER — SODIUM CHLORIDE 0.9 % IV SOLN: 250.0000 mL | INTRAVENOUS | Status: DC | PRN

## 2016-06-15 MED ORDER — IOPAMIDOL (ISOVUE-370) INJECTION 76%
INTRAVENOUS | Status: AC
  Administered 2016-06-15: 75 mL
  Filled 2016-06-15: qty 100

## 2016-06-15 MED ORDER — AMLODIPINE BESYLATE 10 MG PO TABS
10.0000 mg | ORAL_TABLET | Freq: Every day | ORAL | Status: DC
  Administered 2016-06-16: 10 mg via ORAL
  Filled 2016-06-15: qty 1

## 2016-06-15 MED ORDER — SENNOSIDES-DOCUSATE SODIUM 8.6-50 MG PO TABS: 1.0000 | ORAL_TABLET | Freq: Every evening | ORAL | Status: DC | PRN

## 2016-06-15 MED ORDER — STROKE: EARLY STAGES OF RECOVERY BOOK
Freq: Once | Status: DC
  Filled 2016-06-15: qty 1

## 2016-06-15 MED ORDER — ACETAMINOPHEN 650 MG RE SUPP: 650.0000 mg | RECTAL | Status: DC | PRN

## 2016-06-15 MED ORDER — LORAZEPAM 2 MG/ML IJ SOLN
0.5000 mg | INTRAMUSCULAR | Status: DC | PRN
  Administered 2016-06-15: 0.5 mg via INTRAVENOUS
  Filled 2016-06-15: qty 1

## 2016-06-15 MED ORDER — SODIUM CHLORIDE 0.9% FLUSH
3.0000 mL | Freq: Two times a day (BID) | INTRAVENOUS | Status: DC
  Administered 2016-06-15 – 2016-06-16 (×2): 3 mL via INTRAVENOUS

## 2016-06-15 MED ORDER — LISINOPRIL 20 MG PO TABS
40.0000 mg | ORAL_TABLET | Freq: Every day | ORAL | Status: DC
  Administered 2016-06-16: 40 mg via ORAL
  Filled 2016-06-15: qty 2

## 2016-06-15 MED ORDER — ACETAMINOPHEN 160 MG/5ML PO SOLN: 650.0000 mg | ORAL | Status: DC | PRN

## 2016-06-15 MED ORDER — ACETAMINOPHEN 325 MG PO TABS: 650.0000 mg | ORAL_TABLET | ORAL | Status: DC | PRN

## 2016-06-15 MED ORDER — METOPROLOL TARTRATE 50 MG PO TABS
50.0000 mg | ORAL_TABLET | Freq: Two times a day (BID) | ORAL | Status: DC
  Administered 2016-06-16: 50 mg via ORAL
  Filled 2016-06-15: qty 1

## 2016-06-15 MED ORDER — ATORVASTATIN CALCIUM 40 MG PO TABS: 40.0000 mg | ORAL_TABLET | Freq: Every day | ORAL | Status: DC

## 2016-06-15 MED ORDER — SODIUM CHLORIDE 0.9% FLUSH: 3.0000 mL | INTRAVENOUS | Status: DC | PRN

## 2016-06-15 MED ORDER — ENOXAPARIN SODIUM 40 MG/0.4ML ~~LOC~~ SOLN
40.0000 mg | Freq: Every day | SUBCUTANEOUS | Status: DC
  Administered 2016-06-16: 40 mg via SUBCUTANEOUS
  Filled 2016-06-15: qty 0.4

## 2016-06-15 MED ORDER — ASPIRIN EC 325 MG PO TBEC
325.0000 mg | DELAYED_RELEASE_TABLET | Freq: Every day | ORAL | Status: DC
  Administered 2016-06-16: 325 mg via ORAL
  Filled 2016-06-15: qty 1

## 2016-06-15 MED ORDER — CLOPIDOGREL BISULFATE 75 MG PO TABS
75.0000 mg | ORAL_TABLET | Freq: Every day | ORAL | Status: DC
  Administered 2016-06-16: 75 mg via ORAL
  Filled 2016-06-15: qty 1

## 2016-06-15 MED ORDER — BUTALBITAL-APAP-CAFFEINE 50-325-40 MG PO TABS
1.0000 | ORAL_TABLET | Freq: Four times a day (QID) | ORAL | Status: DC | PRN
  Administered 2016-06-16: 1 via ORAL
  Filled 2016-06-15: qty 1

## 2016-06-15 MED ORDER — ESCITALOPRAM OXALATE 10 MG PO TABS
20.0000 mg | ORAL_TABLET | Freq: Every day | ORAL | Status: DC
Start: 2016-06-16 — End: 2016-06-16
  Administered 2016-06-16: 20 mg via ORAL
  Filled 2016-06-15: qty 2

## 2016-06-15 NOTE — H&P (Signed)
[facility] History and Physical   Patient: [name] [id number]   PCP: Berkley Harvey, NP DOB: 1956/01/27   DOA: 06/15/2016   DOS: 06/15/2016   DOS: the patient was seen and examined on 06/15/2016  Patient coming from: The patient is coming from home  Chief Complaint: headache and left facial numbness  HPI: [name] is a 61 y.o. male with Past medical history of Cryptogenic stroke, generalized anxiety disorder, depression, HLD, HTN, OSA, migraine, MCA aneurysm, Loop recorder implant. The patient presented with complaints of headache started this afternoon, roughly 1 hour prior to arrival, followed by numbness involving the left side of the cheek as well as upper lips. He since this symptoms did not improve he decided to come to the ER. At the time of my evaluation symptoms totally resolved. He denies any vomiting but did have some nausea initially which is currently resolved. No chest pain and abdominal pain, no diarrhea no constipation active bleeding. Recently started on Lexapro for anxiety and depression and referred to psychiatry by his neurologist. Recently he was also told that he has orthostatic hypotension and was recommend to eat high salt diet.  ED Course: Code stroke activated, symptoms resolved on its own therefore no TPA given. Patient recommended to be admitted for further workup.  At his baseline ambulates without any support And is independent for most of his ADL; manages his medication on his own.  Review of Systems: as mentioned in the history of present illness.  All other systems reviewed and are negative.  Past Medical History:  Diagnosis Date  . Allergy   . Anxiety   . Cataract   . Cerebral hemorrhage ([place])   . Chest pain, atypical    12/2003:  negative cardiolyte  . Depression   . Drug abuse   . Eczema   . Erectile dysfunction   . History of meniscal tear    bilateral  . HLD (hyperlipidemia)   . HTN (hypertension)   . Hx of tear of  ACL (anterior cruciate ligament)    right  . Insomnia   . Olecranon bursitis of left elbow 10/2009   s/p I&D by Dr Maxie Better, initially assessed by Dr. Nori Riis   . Prediabetes   . Stroke ([place])   . TIA (transient ischemic attack)    Past Surgical History:  Procedure Laterality Date  . EP IMPLANTABLE DEVICE N/A 12/30/2015   Procedure: Loop Recorder Insertion;  Surgeon: Thompson Grayer, MD;  Location: [facility] LAB;  Service: Cardiovascular;  Laterality: N/A;  . INCISE AND DRAIN ABCESS     L elbow due to cellulitis/bursitis  . INNER EAR SURGERY    . IR GENERIC HISTORICAL  12/28/2015   IR ANGIO VERTEBRAL SEL VERTEBRAL UNI L MOD SED 12/28/2015 Luanne Bras, MD [facility]  . IR GENERIC HISTORICAL  12/28/2015   IR ANGIO VERTEBRAL SEL SUBCLAVIAN INNOMINATE UNI R MOD SED 12/28/2015 Luanne Bras, MD [facility]  . IR GENERIC HISTORICAL  12/28/2015   IR ANGIO INTRA EXTRACRAN SEL INTERNAL CAROTID BILAT MOD SED 12/28/2015 Luanne Bras, MD [facility]  . KNEE ARTHROSCOPY    . RADIOLOGY WITH ANESTHESIA N/A 09/20/2015   Procedure: EMBOLIZATION         (RADIOLOGY WITH ANESTHESIA);  Surgeon: Luanne Bras, MD;  Location: [facility];  Service: Radiology;  Laterality: N/A;  . SKIN TAG REMOVAL     11 removed  . TEE WITHOUT CARDIOVERSION N/A 12/30/2015   Procedure: TRANSESOPHAGEAL ECHOCARDIOGRAM (TEE);  Surgeon: Larey Dresser, MD;  Location: [facility];  Service: Cardiovascular;  Laterality: N/A;   Social History:  reports that he has never smoked. He has never used smokeless tobacco. He reports that he does not drink alcohol or use drugs.  No Known Allergies   Family History  Problem Relation Age of Onset  . Stroke Mother   . Hypertension Mother   . Aneurysm Mother 101    Died of brain aneursym  . Heart failure Father   . Emphysema Father   . Diabetes Mellitus II Sister   . Colon cancer Neg Hx   . Rectal cancer Neg Hx   . Stomach cancer Neg Hx      Prior to Admission medications     Medication Sig Start Date End Date Taking? Authorizing Provider  amLODipine (NORVASC) 10 MG tablet Take 1 tablet (10 mg total) by mouth daily. 12/31/15  Yes Mauricio Gerome Apley, MD  aspirin 325 MG EC tablet Take 1 tablet (325 mg total) by mouth daily. 12/31/15  Yes Mauricio Gerome Apley, MD  atorvastatin (LIPITOR) 40 MG tablet Take 1 tablet (40 mg total) by mouth daily at 6 PM. 12/31/15  Yes Mauricio Gerome Apley, MD  chlorthalidone (HYGROTON) 25 MG tablet Take 1 tablet (25 mg total) by mouth daily. 12/31/15  Yes Mauricio Gerome Apley, MD  clopidogrel (PLAVIX) 75 MG tablet Take 1 tablet (75 mg total) by mouth daily. 09/30/15  Yes Lavon Paganini Angiulli, PA-C  Elastic Bandages & Supports (WRIST SPLINT LEFT/RIGHT) MISC Bilateral cock-up wrist splints for carpal tunnel 12/16/15  Yes Britt Bottom, MD  escitalopram (LEXAPRO) 20 MG tablet Take 1 tablet (20 mg total) by mouth daily. 12/31/15  Yes Mauricio Gerome Apley, MD  lisinopril (PRINIVIL,ZESTRIL) 20 MG tablet Take 2 tablets (40 mg total) by mouth daily. 12/31/15  Yes Mauricio Gerome Apley, MD  metoprolol (LOPRESSOR) 50 MG tablet Take 1 tablet (50 mg total) by mouth 2 (two) times daily. 12/31/15  Yes Mauricio Gerome Apley, MD  triamcinolone ointment (KENALOG) 0.1 % Apply 1 application topically See admin instructions. Apply twice daily to affected area for 1 week, then apply twice daily as needed for irritation/rash 12/31/15  Yes Mauricio Gerome Apley, MD  fluticasone [facility]) 50 MCG/ACT nasal spray Place 2 sprays into both nostrils daily as needed for allergies. Patient not taking: Reported on 06/15/2016 12/31/15   Tawni Millers, MD  guaiFENesin-dextromethorphan [facility] DM) 100-10 MG/5ML syrup Take 5 mLs by mouth every 8 (eight) hours as needed for cough. Patient not taking: Reported on 06/15/2016 12/31/15   Tawni Millers, MD    Physical Exam: Vitals:   06/15/16 1601 06/15/16 1609 06/15/16 1812 06/15/16 1813  BP: 139/88  131/80   Pulse:  81   83  Resp: 20     Temp:  98.1 F (36.7 C)    TempSrc:  Oral    SpO2: 96%   95%  Weight:      Height:        General: Alert, Awake and Oriented to Time, Place and Person. Appear in mild distress, affect anxious Eyes: PERRL, Conjunctiva normal ENT: Oral Mucosa clear moist. Neck: no JVD, no Abnormal Mass Or lumps Cardiovascular: S1 and S2 Present, no Murmur, Peripheral Pulses Present Respiratory: Bilateral Air entry equal and Decreased, no use of accessory muscle, Clear to Auscultation, no Crackles, n wheezes Abdomen: Bowel Sound opresent, Soft and no tenderness Skin: no redness, no Rash, no induration Extremities: no Pedal edema, no calf tenderness  Neurologic: Mental status AAOx3, speech normal, attention normal,  Cranial Nerves PERRL, EOM normal and did have some dizziness which is chronic for him, facial sensation to light touch present,  Motor strength bilateral equal strength 5/5,  Sensation present to light touch,  Cerebellar test normal finger nose finger. Gait not checked due to patient safety concerns.   Labs on Admission:  CBC:  Recent Labs Lab 06/15/16 1546 06/15/16 1557  WBC 12.2*  --   NEUTROABS 8.6*  --   HGB 14.1 14.6  HCT 41.8 43.0  MCV 85.7  --   PLT 367  --    Basic Metabolic Panel:  Recent Labs Lab 06/15/16 1546 06/15/16 1557  NA 136 138  K 3.6 3.7  CL 101 99*  CO2 24  --   GLUCOSE 278* 277*  BUN 18 22*  CREATININE 0.98 0.90  CALCIUM 9.2  --    GFR: Estimated Creatinine Clearance: 110.6 mL/min (by C-G formula based on SCr of 0.9 mg/dL). Liver Function Tests:  Recent Labs Lab 06/15/16 1546  AST 29  ALT 38  ALKPHOS 89  BILITOT 0.6  PROT 7.0  ALBUMIN 3.6   Coagulation Profile:  Recent Labs Lab 06/15/16 1546  INR 0.98   Urine analysis:    Component Value Date/Time   COLORURINE YELLOW 12/25/2015 2115   APPEARANCEUR CLEAR 12/25/2015 2115   LABSPEC 1.018 12/25/2015 2115   PHURINE 7.0 12/25/2015 2115   GLUCOSEU NEGATIVE  12/25/2015 2115   HGBUR NEGATIVE 12/25/2015 2115   HGBUR small 02/06/2008 0856   BILIRUBINUR NEGATIVE 12/25/2015 2115   KETONESUR NEGATIVE 12/25/2015 2115   PROTEINUR NEGATIVE 12/25/2015 2115   UROBILINOGEN 0.2 02/06/2008 1047   NITRITE NEGATIVE 12/25/2015 2115   LEUKOCYTESUR NEGATIVE 12/25/2015 2115    Radiological Exams on Admission: Ct Head Code Stroke W/o Cm  Result Date: 06/15/2016 CLINICAL DATA:  Code stroke. LEFT cheek numbness. 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 History of hypertension, diabetes. EXAM: CT HEAD WITHOUT CONTRAST TECHNIQUE: Contiguous axial images were obtained from the base of the skull through the vertex without intravenous contrast. COMPARISON:  CT HEAD March 23, 2016 and MRI head December 24, 2015 FINDINGS: BRAIN: The ventricles and sulci are normal for age. No intraparenchymal hemorrhage, mass effect nor midline shift. Patchy similar supratentorial white matter hypodensities compatible with chronic small vessel ischemic disease. Old basal ganglia lacunar infarcts. No acute large vascular territory infarcts. No abnormal extra-axial fluid collections. Basal cisterns are patent. VASCULAR: Pipeline stent anterior cerebral artery. SKULL: No skull fracture. No significant scalp soft tissue swelling. SINUSES/ORBITS: The mastoid air-cells and included paranasal sinuses are well-aerated.The included ocular globes and orbital contents are non-suspicious. OTHER: None. ASPECTS [facility] Stroke Program Early CT Score) - Ganglionic level infarction (caudate, lentiform nuclei, internal capsule, insula, M1-M3 cortex): 7 - Supraganglionic infarction (M4-M6 cortex): 3 Total score (0-10 with 10 being normal): 10 IMPRESSION: 1. No acute intracranial process. Stable examination: ACA pipeline stent. Mild chronic small vessel ischemic disease and old basal ganglia lacunar infarcts. 2. ASPECTS is 10. Critical Value/emergent results were called by telephone at the time  of interpretation on 06/15/2016 at 4:14 pm to Dr. Lawana Pai, who verbally acknowledged these results. Electronically Signed   By: Elon Alas M.D.   On: 06/15/2016 16:15   EKG: Independently reviewed. normal sinus rhythm, nonspecific ST and T waves changes.  Assessment/Plan 1. TIA. Neurology consulted, symptoms currently resolved. Will check MRI brain MRA brain. Carotid Doppler and echo ordered. Neurology recommends to continue  aspirin and Plavix. Continue Lipitor. Check LDL and hemoglobin A1c. Permissive hypertension, holding blood pressure medications overnight and resuming tomorrow. PTOT and speech therapy consult. Cardiac diet if the patient passes stroke swallow test.  2. Depression and anxiety. Continuing home medications.  3. Prediabetes versus type 2 diabetes mellitus. Check hemoglobin A1c.  4. Migraine. Patient does have history of competent Migratine as well as headache. Next lab presents with headache this time. We'll use Fioricet.  Nutrition: Nothing by mouth pending stroke swallow test, cardiac diet if passes DVT Prophylaxis: subcutaneous Heparin  Advance goals of care discussion: Full code   Consults: Neurology  Family Communication: no family was present at bedside, at the time of interview.  Disposition: Admitted as observation, telemetry unit. Likely to be discharged home, in 1 days.  Author: Berle Mull, MD [facility] Pager: [phone] 06/15/2016  If 7PM-7AM, please contact night-coverage [email or web address] Password [email or web address]

## 2016-06-15 NOTE — ED Notes (Signed)
CT made aware pt ready for CTA. IV placed.

## 2016-06-15 NOTE — Progress Notes (Signed)
*  PRELIMINARY RESULTS* Vascular Ultrasound Carotid Duplex (Doppler) has been completed.  Preliminary findings: Bilateral 1-39% ICA stenosis, antegrade vertebral flow.   Everrett Coombe 06/15/2016, 7:26 PM

## 2016-06-15 NOTE — ED Triage Notes (Addendum)
Patient from home with [facility] for left sided cheek numbness and left leg weakness.  Patient is alert and oriented at this time and in no apparent distress.  20g saline lock in right hand.  Patient has history of frontal aneurysm.  CBG 281.

## 2016-06-15 NOTE — ED Notes (Signed)
Pt in MRI, 5C aware

## 2016-06-15 NOTE — Progress Notes (Addendum)
Pt arrived from ED around 2300 alert and oriented able to ambulate to rest room with stand by assist, his symptoms have resolved but he does have some residual issues from previous stroke/anuyism, current NIH is 0 he passed his stroke swallow screen. Will continue with Q2 neuro and vitals and put in a carb mod heart healthy diet for him. Pt also indicated paper work that was taken from him upon entering ED which hen says had medication information and other medical information from a recent Dr's visit which was not returned back to him when he left the ED area to come up to [facility]. I called down to the ED they never found it or responded back to me.

## 2016-06-15 NOTE — Consult Note (Signed)
Reason for Consult: possible stroke Referring Physician:  ED  [name] is an 61 y.o. male.  HPI: Patient brought to the ED as a code stroke for left facial numbness and slight left leg weakness.  Patient states he started having a headache earlier in the day.  Then a bit later he noticed his left cheek was numb and also had numbness of the lips and tongue on both sides.  He then became anxious and thought the left leg might be a bit week.  The symptoms quickly improved and now in the ED he only has slight numbness of the lips.  He states he takes ASA and Plavix daily along with Lipitor.  He does have a h/o TIA and stroke in the past along with a treated brain aneurysm.   Past Medical History:  Diagnosis Date  . Allergy   . Anxiety   . Cataract   . Cerebral hemorrhage (HCC)   . Chest pain, atypical    12/2003:  negative cardiolyte  . Depression   . Drug abuse   . Eczema   . Erectile dysfunction   . History of meniscal tear    bilateral  . HLD (hyperlipidemia)   . HTN (hypertension)   . Hx of tear of ACL (anterior cruciate ligament)    right  . Insomnia   . Olecranon bursitis of left elbow 10/2009   s/p I&D by Dr Jillyn Hidden, initially assessed by Dr. Jennette Kettle   . Prediabetes   . Stroke (HCC)   . TIA (transient ischemic attack)     Past Surgical History:  Procedure Laterality Date  . EP IMPLANTABLE DEVICE N/A 12/30/2015   Procedure: Loop Recorder Insertion;  Surgeon: Hillis Range, MD;  [facility];  Service: Cardiovascular;  Laterality: N/A;  . INCISE AND DRAIN ABCESS     L elbow due to cellulitis/bursitis  . INNER EAR SURGERY    . IR GENERIC HISTORICAL  12/28/2015   IR ANGIO VERTEBRAL SEL VERTEBRAL UNI L MOD SED 12/28/2015 Julieanne Cotton, MD [facility]-INTERV RAD  . IR GENERIC HISTORICAL  12/28/2015   IR ANGIO VERTEBRAL SEL SUBCLAVIAN INNOMINATE UNI R MOD SED 12/28/2015 Julieanne Cotton, MD [facility]  . IR GENERIC HISTORICAL  12/28/2015   IR ANGIO INTRA EXTRACRAN SEL INTERNAL  CAROTID BILAT MOD SED 12/28/2015 Julieanne Cotton, MD [facility]  . KNEE ARTHROSCOPY    . RADIOLOGY WITH ANESTHESIA N/A 09/20/2015   Procedure: EMBOLIZATION         (RADIOLOGY WITH ANESTHESIA);  Surgeon: Julieanne Cotton, MD;  Location: [facility];  Service: Radiology;  Laterality: N/A;  . SKIN TAG REMOVAL     11 removed  . TEE WITHOUT CARDIOVERSION N/A 12/30/2015   Procedure: TRANSESOPHAGEAL ECHOCARDIOGRAM (TEE);  Surgeon: Laurey Morale, MD;  Location: [facility] ENDOSCOPY;  Service: Cardiovascular;  Laterality: N/A;    Family History  Problem Relation Age of Onset  . Stroke Mother   . Hypertension Mother   . Aneurysm Mother 28    Died of brain aneursym  . Heart failure Father   . Emphysema Father   . Diabetes Mellitus II Sister   . Colon cancer Neg Hx   . Rectal cancer Neg Hx   . Stomach cancer Neg Hx     Social History:  reports that he has never smoked. He has never used smokeless tobacco. He reports that he does not drink alcohol or use drugs.  Allergies: No Known Allergies  Medications: I have reviewed the  patient's current medications.  Results for orders placed or performed during the hospital encounter of 06/15/16 (from the past 48 hour(s))  Protime-INR     Status: None   Collection Time: 06/15/16  3:46 PM  Result Value Ref Range   Prothrombin Time 13.0 11.4 - 15.2 seconds   INR 0.98   APTT     Status: None   Collection Time: 06/15/16  3:46 PM  Result Value Ref Range   aPTT 24 24 - 36 seconds  CBC     Status: Abnormal   Collection Time: 06/15/16  3:46 PM  Result Value Ref Range   WBC 12.2 (H) 4.0 - 10.5 K/uL   RBC 4.88 4.22 - 5.81 MIL/uL   Hemoglobin 14.1 13.0 - 17.0 g/dL   HCT 41.8 39.0 - 52.0 %   MCV 85.7 78.0 - 100.0 fL   MCH 28.9 26.0 - 34.0 pg   MCHC 33.7 30.0 - 36.0 g/dL   RDW 13.8 11.5 - 15.5 %   Platelets 367 150 - 400 K/uL  Differential     Status: Abnormal   Collection Time: 06/15/16  3:46 PM  Result Value Ref Range   Neutrophils Relative % 70 %    Neutro Abs 8.6 (H) 1.7 - 7.7 K/uL   Lymphocytes Relative 20 %   Lymphs Abs 2.4 0.7 - 4.0 K/uL   Monocytes Relative 5 %   Monocytes Absolute 0.6 0.1 - 1.0 K/uL   Eosinophils Relative 4 %   Eosinophils Absolute 0.5 0.0 - 0.7 K/uL   Basophils Relative 1 %   Basophils Absolute 0.1 0.0 - 0.1 K/uL  Comprehensive metabolic panel     Status: Abnormal   Collection Time: 06/15/16  3:46 PM  Result Value Ref Range   Sodium 136 135 - 145 mmol/L   Potassium 3.6 3.5 - 5.1 mmol/L   Chloride 101 101 - 111 mmol/L   CO2 24 22 - 32 mmol/L   Glucose, Bld 278 (H) 65 - 99 mg/dL   BUN 18 6 - 20 mg/dL   Creatinine, Ser 0.98 0.61 - 1.24 mg/dL   Calcium 9.2 8.9 - 10.3 mg/dL   Total Protein 7.0 6.5 - 8.1 g/dL   Albumin 3.6 3.5 - 5.0 g/dL   AST 29 15 - 41 U/L   ALT 38 17 - 63 U/L   Alkaline Phosphatase 89 38 - 126 U/L   Total Bilirubin 0.6 0.3 - 1.2 mg/dL   GFR calc non Af Amer >60 >60 mL/min   GFR calc Af Amer >60 >60 mL/min    Comment: (NOTE) The eGFR has been calculated using the CKD EPI equation. This calculation has not been validated in all clinical situations. eGFR's persistently <60 mL/min signify possible Chronic Kidney Disease.    Anion gap 11 5 - 15  I-stat troponin, ED     Status: None   Collection Time: 06/15/16  3:55 PM  Result Value Ref Range   Troponin i, poc 0.01 0.00 - 0.08 ng/mL   Comment 3            Comment: Due to the release kinetics of cTnI, a negative result within the first hours of the onset of symptoms does not rule out myocardial infarction with certainty. If myocardial infarction is still suspected, repeat the test at appropriate intervals.   I-Stat Chem 8, ED     Status: Abnormal   Collection Time: 06/15/16  3:57 PM  Result Value Ref Range   Sodium 138 135 -  145 mmol/L   Potassium 3.7 3.5 - 5.1 mmol/L   Chloride 99 (L) 101 - 111 mmol/L   BUN 22 (H) 6 - 20 mg/dL   Creatinine, Ser 0.90 0.61 - 1.24 mg/dL   Glucose, Bld 277 (H) 65 - 99 mg/dL   Calcium, Ion 1.07  (L) 1.15 - 1.40 mmol/L   TCO2 25 0 - 100 mmol/L   Hemoglobin 14.6 13.0 - 17.0 g/dL   HCT 43.0 39.0 - 52.0 %    Ct Head Code Stroke W/o Cm  Result Date: 06/15/2016 CLINICAL DATA:  Code stroke. LEFT cheek numbness. 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 History of hypertension, diabetes. EXAM: CT HEAD WITHOUT CONTRAST TECHNIQUE: Contiguous axial images were obtained from the base of the skull through the vertex without intravenous contrast. COMPARISON:  CT HEAD March 23, 2016 and MRI head December 24, 2015 FINDINGS: BRAIN: The ventricles and sulci are normal for age. No intraparenchymal hemorrhage, mass effect nor midline shift. Patchy similar supratentorial white matter hypodensities compatible with chronic small vessel ischemic disease. Old basal ganglia lacunar infarcts. No acute large vascular territory infarcts. No abnormal extra-axial fluid collections. Basal cisterns are patent. VASCULAR: Pipeline stent anterior cerebral artery. SKULL: No skull fracture. No significant scalp soft tissue swelling. SINUSES/ORBITS: The mastoid air-cells and included paranasal sinuses are well-aerated.The included ocular globes and orbital contents are non-suspicious. OTHER: None. ASPECTS [facility] Stroke Program Early CT Score) - Ganglionic level infarction (caudate, lentiform nuclei, internal capsule, insula, M1-M3 cortex): 7 - Supraganglionic infarction (M4-M6 cortex): 3 Total score (0-10 with 10 being normal): 10 IMPRESSION: 1. No acute intracranial process. Stable examination: ACA pipeline stent. Mild chronic small vessel ischemic disease and old basal ganglia lacunar infarcts. 2. ASPECTS is 10. Critical Value/emergent results were called by telephone at the time of interpretation on 06/15/2016 at 4:14 pm to Dr. Lawana Pai, who verbally acknowledged these results. Electronically Signed   By: Elon Alas M.D.   On: 06/15/2016 16:15    ROS  He had a HA but this is improved.  He denies any  difficulty with speech/swallow or vision  Blood pressure 139/88, pulse 81, temperature 98.1 F (36.7 C), temperature source Oral, resp. rate 20, height '5\' 8"'$  (1.727 m), weight 124.2 kg (273 lb 13 oz), SpO2 96 %. Physical Exam Appears slightly anxious but no distress NEURO MS - normal CN - has subjective sensory changes involving lips Motor - equal 5/5 strength  Sensory - no extinction Coordination - intact DTR - symmetric Station/gait - not tested  Assessment/Plan: 1. Possible TIA The patient is not a candidate for t-PA though he may have had a TIA.  He will be admitted for further work-up including an MRI brain and vessel imaging (CTA vs MRA) as per stroke team.  He should also get an echo if that has not been done recently.   He can continue his current anti-platelet and statin agents.   2. H/o cerebrovascular disease  3.Chronic medical disorders   Dr. Lawana Pai [facility] [phone]  06/15/2016, 5:06 PM

## 2016-06-15 NOTE — ED Notes (Signed)
Patient transported to CT 

## 2016-06-15 NOTE — Code Documentation (Signed)
61 y.o. Male arrives to [facility] ED via [facility] as a Code stroke. Around 1200 today the patient began having a "dull/throbbing" headache that was reported to be a 3 out of 10. About 1450 (LKW)  the patient reports he began to have a sudden onset of LLE hemiparesis and left cheek numbness. Upon arrival to ED, labs were drawn and pt taken to CT. CT showed no acute intracranial abnormalities. After CT, patient was taken back to the ED. NIHSS of 1 was assessed. See EMR for NIHSS and code stroke times. tPA was not given d/t symptoms mild, improving. Patient currently with numbness/tingling in his left cheek, left side of tongue and bilateral lips. Bedside handoff with ED RN Carlis Abbott.

## 2016-06-15 NOTE — ED Provider Notes (Signed)
[facility] DEPT Provider Note   CSN: [id number] Arrival date & time: 06/15/16  1544     History   Chief Complaint Chief Complaint  Patient presents with  . Code Stroke    HPI [name] is a 61 y.o. male.  Pt presents to the ED today as a Code Stroke.  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  Pt has been seen at [facility] and is followed at the aneurysm clinic and the stroke clinic there.  He is on ASA and Plavix.  He presents to the ED today with a headache, left facial numbness and left leg weakness.  Headache started at 12:00.  Numbness and weakness started about 1 hour later.  Stroke team met patient upon arrival to the ED.      Past Medical History:  Diagnosis Date  . Allergy   . Anxiety   . Cataract   . Cerebral hemorrhage ([place])   . Chest pain, atypical    12/2003:  negative cardiolyte  . Depression   . Drug abuse   . Eczema   . Erectile dysfunction   . History of meniscal tear    bilateral  . HLD (hyperlipidemia)   . HTN (hypertension)   . Hx of tear of ACL (anterior cruciate ligament)    right  . Insomnia   . Olecranon bursitis of left elbow 10/2009   s/p I&D by Dr Maxie Better, initially assessed by Dr. Nori Riis   . Prediabetes   . Stroke ([place])   . TIA (transient ischemic attack)     Patient Active Problem List   Diagnosis Date Noted  . CVA (cerebral vascular accident) ([place]) 06/15/2016  . Pain of upper abdomen   . Numbness   . Carpal tunnel syndrome, bilateral 12/16/2015  . Status post stroke 11/12/2015  . Transient neurologic deficit 10/15/2015  . Cerebrovascular accident, late effects 10/07/2015  . Complicated migraine   . Anxiety state   . 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 Cerebral infarction due to embolism of right middle cerebral artery ([place]) 09/23/2015  . Cerebral infarction due to embolism of cerebral artery ([place]) 09/23/2015  . Gait disturbance, post-stroke   . Drooping of mouth   . Cerebrovascular accident (CVA)  due to embolism of right anterior cerebral artery ([place])   . Benign essential HTN   . Migraine with aura and without status migrainosus, not intractable   . Tachypnea   . Prediabetes   . Acute blood loss anemia   . Brain aneurysm 09/20/2015  . Type 2 diabetes mellitus ([place]) 09/15/2015  . Leukocytosis 09/15/2015  . Aneurysm, cerebral, nonruptured 09/15/2015  . TIA (transient ischemic attack) 09/14/2015  . Temporary cerebral vascular dysfunction 09/14/2015  . H/O transient cerebral ischemia 07/29/2015  . At risk for falling 07/29/2015  . Bilateral hearing loss 10/25/2014  . Abnormal fear 08/08/2012  . Headache, migraine 08/08/2012  . Arthritis, degenerative 08/08/2012  . History of tear of ACL (anterior cruciate ligament) 03/08/2011  . History of knee problem 03/08/2011  . Depression 09/07/2010  . Depressive disorder, not elsewhere classified 09/07/2010  . URI (upper respiratory infection) 08/24/2010  . Hearing loss 09/06/2006  . Hyperlipidemia 04/23/2006  . Essential hypertension 04/23/2006  . INSOMNIA 04/23/2006    Past Surgical History:  Procedure Laterality Date  . EP IMPLANTABLE DEVICE N/A 12/30/2015   Procedure: Loop Recorder Insertion;  Surgeon: Thompson Grayer, MD;  Location: [facility] CV LAB;  Service: Cardiovascular;  Laterality: N/A;  .  INCISE AND DRAIN ABCESS     L elbow due to cellulitis/bursitis  . INNER EAR SURGERY    . IR GENERIC HISTORICAL  12/28/2015   IR ANGIO VERTEBRAL SEL VERTEBRAL UNI L MOD SED 12/28/2015 Luanne Bras, MD [facility]INTER[facility]  . IR GENERIC HISTORICAL  12/28/2015   IR ANGIO VERTEBRAL SEL SUBCLAVIAN INNOMINATE UNI R MOD SED 12/28/2015 Luanne Bras, MD [facility]  . IR GENERIC HISTORICAL  12/28/2015   IR ANGIO INTRA EXTRACRAN SEL INTERNAL CAROTID BILAT MOD SED 12/28/2015 Luanne Bras, MD [facility]  . KNEE ARTHROSCOPY    . RADIOLOGY WITH ANESTHESIA N/A 09/20/2015   Procedure: EMBOLIZATION         (RADIOLOGY WITH ANESTHESIA);  Surgeon: Luanne Bras, MD;  Location: [facility];  Service: Radiology;  Laterality: N/A;  . SKIN TAG REMOVAL     11 removed  . TEE WITHOUT CARDIOVERSION N/A 12/30/2015   Procedure: TRANSESOPHAGEAL ECHOCARDIOGRAM (TEE);  Surgeon: Larey Dresser, MD;  Location: [facility];  Service: Cardiovascular;  Laterality: N/A;       Home Medications    Prior to Admission medications   Medication Sig Start Date End Date Taking? Authorizing Provider  amLODipine (NORVASC) 10 MG tablet Take 1 tablet (10 mg total) by mouth daily. 12/31/15  Yes Mauricio Gerome Apley, MD  aspirin 325 MG EC tablet Take 1 tablet (325 mg total) by mouth daily. 12/31/15  Yes Mauricio Gerome Apley, MD  atorvastatin (LIPITOR) 40 MG tablet Take 1 tablet (40 mg total) by mouth daily at 6 PM. 12/31/15  Yes Mauricio Gerome Apley, MD  chlorthalidone (HYGROTON) 25 MG tablet Take 1 tablet (25 mg total) by mouth daily. 12/31/15  Yes Mauricio Gerome Apley, MD  clopidogrel (PLAVIX) 75 MG tablet Take 1 tablet (75 mg total) by mouth daily. 09/30/15  Yes Lavon Paganini Angiulli, PA-C  Elastic Bandages & Supports (WRIST SPLINT LEFT/RIGHT) MISC Bilateral cock-up wrist splints for carpal tunnel 12/16/15  Yes Britt Bottom, MD  escitalopram (LEXAPRO) 20 MG tablet Take 1 tablet (20 mg total) by mouth daily. 12/31/15  Yes Mauricio Gerome Apley, MD  lisinopril (PRINIVIL,ZESTRIL) 20 MG tablet Take 2 tablets (40 mg total) by mouth daily. 12/31/15  Yes Mauricio Gerome Apley, MD  metoprolol (LOPRESSOR) 50 MG tablet Take 1 tablet (50 mg total) by mouth 2 (two) times daily. 12/31/15  Yes Mauricio Gerome Apley, MD  triamcinolone ointment (KENALOG) 0.1 % Apply 1 application topically See admin instructions. Apply twice daily to affected area for 1 week, then apply twice daily as needed for irritation/rash 12/31/15  Yes Mauricio Gerome Apley, MD  fluticasone [facility]) 50 MCG/ACT nasal spray Place 2 sprays into both nostrils daily as needed for allergies. Patient not taking: Reported on  06/15/2016 12/31/15   Tawni Millers, MD  guaiFENesin-dextromethorphan [facility] DM) 100-10 MG/5ML syrup Take 5 mLs by mouth every 8 (eight) hours as needed for cough. Patient not taking: Reported on 06/15/2016 12/31/15   Tawni Millers, MD    Family History Family History  Problem Relation Age of Onset  . Stroke Mother   . Hypertension Mother   . Aneurysm Mother 44    Died of brain aneursym  . Heart failure Father   . Emphysema Father   . Diabetes Mellitus II Sister   . Colon cancer Neg Hx   . Rectal cancer Neg Hx   . Stomach cancer Neg Hx     Social History Social History  Substance Use Topics  . Smoking status:  Never Smoker  . Smokeless tobacco: Never Used  . Alcohol use No     Allergies   Patient has no known allergies.   Review of Systems Review of Systems  Neurological: Positive for weakness, numbness and headaches.  All other systems reviewed and are negative.    Physical Exam Updated Vital Signs BP 139/88 (BP Location: Right Arm)   Pulse 81   Temp 98.1 F (36.7 C) (Oral)   Resp 20   Ht _0  (1.727 m)   Wt 273 lb 13 oz (124.2 kg)   SpO2 96%   BMI 41.63 kg/m   Physical Exam  Constitutional: He is oriented to person, place, and time. He appears well-developed and well-nourished.  HENT:  Head: Normocephalic and atraumatic.  Right Ear: External ear normal.  Left Ear: External ear normal.  Nose: Nose normal.  Mouth/Throat: Oropharynx is clear and moist.  Eyes: Conjunctivae and EOM are normal. Pupils are equal, round, and reactive to light.  Neck: Normal range of motion. Neck supple.  Cardiovascular: Normal rate, regular rhythm, normal heart sounds and intact distal pulses.   Pulmonary/Chest: Effort normal and breath sounds normal.  Abdominal: Soft. Bowel sounds are normal.  Musculoskeletal: Normal range of motion.  Neurological: He is alert and oriented to person, place, and time.  Left facial numbness  Skin: Skin is warm.    Psychiatric: He has a normal mood and affect. His behavior is normal. Judgment and thought content normal.  Nursing note and vitals reviewed.    ED Treatments / Results  Labs (all labs ordered are listed, but only abnormal results are displayed) Labs Reviewed  CBC - Abnormal; Notable for the following:       Result Value   WBC 12.2 (*)    All other components within normal limits  DIFFERENTIAL - Abnormal; Notable for the following:    Neutro Abs 8.6 (*)    All other components within normal limits  COMPREHENSIVE METABOLIC PANEL - Abnormal; Notable for the following:    Glucose, Bld 278 (*)    All other components within normal limits  I-STAT CHEM 8, ED - Abnormal; Notable for the following:    Chloride 99 (*)    BUN 22 (*)    Glucose, Bld 277 (*)    Calcium, Ion 1.07 (*)    All other components within normal limits  PROTIME-INR  APTT  I-STAT TROPOININ, ED  CBG MONITORING, ED    EKG  EKG Interpretation  Date/Time:  Thursday June 15 2016 16:24:55 EST Ventricular Rate:  75 PR Interval:    QRS Duration: 107 QT Interval:  412 QTC Calculation: 461 R Axis:   45 Text Interpretation:  Sinus rhythm Confirmed by Jerney Baksh MD, Trev Boley ([id number]) on 06/15/2016 4:51:07 PM       Radiology Ct Head Code Stroke W/o Cm  Result Date: 06/15/2016 CLINICAL DATA:  Code stroke. LEFT cheek numbness. 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 History of hypertension, diabetes. EXAM: CT HEAD WITHOUT CONTRAST TECHNIQUE: Contiguous axial images were obtained from the base of the skull through the vertex without intravenous contrast. COMPARISON:  CT HEAD March 23, 2016 and MRI head December 24, 2015 FINDINGS: BRAIN: The ventricles and sulci are normal for age. No intraparenchymal hemorrhage, mass effect nor midline shift. Patchy similar supratentorial white matter hypodensities compatible with chronic small vessel ischemic disease. Old basal ganglia lacunar infarcts. No acute  large vascular territory infarcts. No abnormal extra-axial fluid collections. Basal cisterns are patent. VASCULAR:  Pipeline stent anterior cerebral artery. SKULL: No skull fracture. No significant scalp soft tissue swelling. SINUSES/ORBITS: The mastoid air-cells and included paranasal sinuses are well-aerated.The included ocular globes and orbital contents are non-suspicious. OTHER: None. ASPECTS [facility] Stroke Program Early CT Score) - Ganglionic level infarction (caudate, lentiform nuclei, internal capsule, insula, M1-M3 cortex): 7 - Supraganglionic infarction (M4-M6 cortex): 3 Total score (0-10 with 10 being normal): 10 IMPRESSION: 1. No acute intracranial process. Stable examination: ACA pipeline stent. Mild chronic small vessel ischemic disease and old basal ganglia lacunar infarcts. 2. ASPECTS is 10. Critical Value/emergent results were called by telephone at the time of interpretation on 06/15/2016 at 4:14 pm to Dr. Lawana Pai, who verbally acknowledged these results. Electronically Signed   By: Elon Alas M.D.   On: 06/15/2016 16:15    Procedures Procedures (including critical care time)  Medications Ordered in ED Medications - No data to display   Initial Impression / Assessment and Plan / ED Course  I have reviewed the triage vital signs and the nursing notes.  Pertinent labs & imaging results that were available during my care of the patient were reviewed by me and considered in my medical decision making (see chart for details).    Pt d/w stroke team.  They recommend admission for observation and they will follow.  Pt d/w Dr. Posey Pronto ([facility]ad) who will admit for observation  Final Clinical Impressions(s) / ED Diagnoses   Final diagnoses:  Cerebrovascular accident (CVA), unspecified mechanism ([place])    New Prescriptions New Prescriptions   No medications on file     Isla Pence, MD 06/15/16 1701

## 2016-06-16 ENCOUNTER — Observation Stay (HOSPITAL_BASED_OUTPATIENT_CLINIC_OR_DEPARTMENT_OTHER): Payer: Managed Care, Other (non HMO)

## 2016-06-16 DIAGNOSIS — I671 Cerebral aneurysm, nonruptured: Secondary | ICD-10-CM

## 2016-06-16 DIAGNOSIS — I69398 Other sequelae of cerebral infarction: Secondary | ICD-10-CM

## 2016-06-16 DIAGNOSIS — G459 Transient cerebral ischemic attack, unspecified: Secondary | ICD-10-CM | POA: Diagnosis not present

## 2016-06-16 DIAGNOSIS — R269 Unspecified abnormalities of gait and mobility: Secondary | ICD-10-CM | POA: Diagnosis not present

## 2016-06-16 DIAGNOSIS — I10 Essential (primary) hypertension: Secondary | ICD-10-CM

## 2016-06-16 DIAGNOSIS — G43009 Migraine without aura, not intractable, without status migrainosus: Secondary | ICD-10-CM | POA: Diagnosis not present

## 2016-06-16 LAB — LIPID PANEL
Cholesterol: 173 mg/dL (ref 0–200)
HDL: 38 mg/dL — ABNORMAL LOW
LDL Cholesterol: 94 mg/dL (ref 0–99)
Total CHOL/HDL Ratio: 4.6 ratio
Triglycerides: 203 mg/dL — ABNORMAL HIGH
VLDL: 41 mg/dL — ABNORMAL HIGH (ref 0–40)

## 2016-06-16 LAB — VAS US CAROTID
LEFT ECA DIAS: -29 cm/s
LEFT VERTEBRAL DIAS: -23 cm/s
Left CCA dist dias: -24 cm/s
Left CCA dist sys: -79 cm/s
Left CCA prox dias: 21 cm/s
Left CCA prox sys: 119 cm/s
Left ICA dist dias: -15 cm/s
Left ICA dist sys: -83 cm/s
Left ICA prox dias: -17 cm/s
Left ICA prox sys: -68 cm/s
RIGHT ECA DIAS: -22 cm/s
RIGHT VERTEBRAL DIAS: -15 cm/s
Right CCA prox dias: 29 cm/s
Right CCA prox sys: 98 cm/s
Right cca dist sys: -98 cm/s

## 2016-06-16 LAB — ECHOCARDIOGRAM COMPLETE
E decel time: 264 ms
E/e' ratio: 10.5
FS: 27 % — AB (ref 28–44)
Height: 68 in
IVS/LV PW RATIO, ED: 0.87
LA ID, A-P, ES: 30 mm
LA diam end sys: 30 mm
LA diam index: 1.21 cm/m2
LA vol A4C: 46.6 mL
LA vol index: 17.5 mL/m2
LA vol: 43.5 mL
LV E/e' medial: 10.5
LV E/e'average: 10.5
LV PW d: 13.5 mm — AB (ref 0.6–1.1)
LV e' LATERAL: 5.22 cm/s
LVOT area: 3.14 cm2
LVOT diameter: 20 mm
Lateral S' vel: 19.7 cm/s
MV Dec: 264
MV pk A vel: 68.4 m/s
MV pk E vel: 54.8 m/s
TDI e' lateral: 5.22
TDI e' medial: 5.44
Weight: 4324.54 [oz_av]

## 2016-06-16 LAB — BASIC METABOLIC PANEL WITH GFR
Anion gap: 12 (ref 5–15)
BUN: 17 mg/dL (ref 6–20)
CO2: 27 mmol/L (ref 22–32)
Calcium: 9.2 mg/dL (ref 8.9–10.3)
Chloride: 99 mmol/L — ABNORMAL LOW (ref 101–111)
Creatinine, Ser: 0.94 mg/dL (ref 0.61–1.24)
GFR calc Af Amer: >60 mL/min
GFR calc non Af Amer: >60 mL/min
Glucose, Bld: 169 mg/dL — ABNORMAL HIGH (ref 65–99)
Potassium: 3.4 mmol/L — ABNORMAL LOW (ref 3.5–5.1)
Sodium: 138 mmol/L (ref 135–145)

## 2016-06-16 LAB — GLUCOSE, CAPILLARY: GLUCOSE-CAPILLARY: 204 mg/dL — AB (ref 65–99)

## 2016-06-16 LAB — CBC
HEMATOCRIT: 43.5 % (ref 39.0–52.0)
HEMOGLOBIN: 14.7 g/dL (ref 13.0–17.0)
MCH: 29 pg (ref 26.0–34.0)
MCHC: 33.8 g/dL (ref 30.0–36.0)
MCV: 85.8 fL (ref 78.0–100.0)
Platelets: 378 10*3/uL (ref 150–400)
RBC: 5.07 MIL/uL (ref 4.22–5.81)
RDW: 14 % (ref 11.5–15.5)
WBC: 13.4 10*3/uL — ABNORMAL HIGH (ref 4.0–10.5)

## 2016-06-16 MED ORDER — PERFLUTREN LIPID MICROSPHERE
INTRAVENOUS | Status: AC
  Administered 2016-06-16: 2 mL via INTRAVENOUS
  Filled 2016-06-16: qty 10

## 2016-06-16 MED ORDER — INSULIN ASPART 100 UNIT/ML ~~LOC~~ SOLN: 0.0000 [IU] | Freq: Every day | SUBCUTANEOUS | Status: DC

## 2016-06-16 MED ORDER — PERFLUTREN LIPID MICROSPHERE
1.0000 mL | INTRAVENOUS | Status: AC | PRN
  Administered 2016-06-16: 2 mL via INTRAVENOUS
  Filled 2016-06-16: qty 10

## 2016-06-16 MED ORDER — INSULIN ASPART 100 UNIT/ML ~~LOC~~ SOLN
0.0000 [IU] | Freq: Three times a day (TID) | SUBCUTANEOUS | Status: DC
Start: 2016-06-16 — End: 2016-06-16
  Administered 2016-06-16: 5 [IU] via SUBCUTANEOUS

## 2016-06-16 NOTE — Care Management Note (Signed)
Case Management Note  Patient Details  Name: [name] MRN: [id number] Date of Birth: 02/06/1956  Subjective/Objective:                    Action/Plan: Pt discharging home with orders for [facility] services. Pt has [place] and [place] has to be arranged through [facility]. CM called and informed them of the [facility] need and information they requested was faxed to the number provided. Therapy is to start 06/19/2016.  Pt states he is not able to see psychiatrist he was referred to d/t the cost. CM spoke to CSW and they referred him to [facility] who takes [place]. Pt given number for [facility].  Pt c/o tooth ache and states he is unable to get his tooth fixed d/t the cost. He is on the waiting list at [facility]. CM provided him a list of low cost dental clinics for him to call and see if he can be seen.  Pt states he is not currently driving d/t his vertigo. Pt states he is only getting out when his daughter or sister are able to come and take him to appointments or the store. CM provided him the information and application for [facility].  Pt states he has transportation home today.   Expected Discharge Date:  06/16/16               Expected Discharge Plan:  [place]  In-House Referral:     Discharge planning Services  CM Consult  Post Acute Care Choice:  Home Health Choice offered to:   ([employer] --[facility] arranges [facility])  DME Arranged:    DME Agency:     HH Arranged:  PT HH Agency:   ([facility])  Status of Service:  Completed, signed off  If discussed at [place] of Stay Meetings, dates discussed:    Additional Comments:  Pollie Friar, RN 06/16/2016, 2:57 PM

## 2016-06-16 NOTE — Progress Notes (Signed)
Pt discharge education and instructions completed with pt and sister at bedside. Both voices understanding and denies any questions. Pt IV and telemetry removed. Pt discharge home with sister to transport him home. Pt declines wheelchair and ambulated off unit with sister and belongings to the side off unit. Delia Heady RN

## 2016-06-16 NOTE — Evaluation (Signed)
Physical Therapy Evaluation Patient Details Name: [name] MRN: [id number] DOB: 07/18/1955 Today's Date: 06/16/2016   History of Present Illness  OUSMANE Hoover is a 61 y.o. 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 Admiite this admssion for headache and left facial numbness  Clinical Impression  Patient presents with decreased mobility due to deficits listed in PT problem list.  Patient demonstrates decreased balance, decreased tolerance to activity with nausea and will benefit from skilled PT in the acute setting to allow d/c home with intermittent family support and follow up HHPT. Recommend frequent mobility with nursing to allow continued re-habituation due to visual and vestibular symptoms.      Follow Up Recommendations Home health PT    Equipment Recommendations  None recommended by PT    Recommendations for Other Services       Precautions / Restrictions Precautions Precautions: Fall Restrictions Weight Bearing Restrictions: No      Mobility  Bed Mobility Overal bed mobility: Independent                Transfers Overall transfer level: Modified independent Equipment used: None Transfers: Sit to/from Stand Sit to Stand: Modified independent (Device/Increase time)         General transfer comment: S due to history of vertigo that makes him "fall out". He says he has also been diagnosed with orthostatic HTN (this did not show during my session with vitals checked)  Ambulation/Gait Ambulation/Gait assistance: Supervision Ambulation Distance (Feet): 120 Feet Assistive device: None Gait Pattern/deviations: Step-through pattern;Decreased stride length;Wide base of support     General Gait Details: slow pace, mildly unsteady and reaching for  rail x 1  Stairs Stairs: Yes Stairs assistance: Supervision Stair Management: Two rails;Step to pattern;Forwards Number of Stairs: 10 General stair comments: supervision for safety, slow pace, leads up with R first, down with R first (states feels his L won't accept weight to descend without buckling)  Wheelchair Mobility    Modified Rankin (Stroke Patients Only) Modified Rankin (Stroke Patients Only) Pre-Morbid Rankin Score: Slight disability Modified Rankin: Moderate disability     Balance Overall balance assessment: Needs assistance Sitting-balance support: No upper extremity supported;Feet supported Sitting balance-Leahy Scale: Good     Standing balance support: No upper extremity supported Standing balance-Leahy Scale: Fair Standing balance comment: stands 30 sec feet together with increased sway, stands feet apart and looks to L and R no LOB; min c/o nausea                             Pertinent Vitals/Pain Pain Assessment: No/denies pain    Home Living Family/patient expects to be discharged to:: Private residence Living Arrangements: Alone Available Help at Discharge: Family;Available PRN/intermittently Type of Home: Apartment Home Access: Stairs to enter Entrance Stairs-Rails: Left;Right;Can reach both Entrance Stairs-Number of Steps: 3 + 14 Home Layout: One level Home Equipment: None Additional Comments: dtr and sister live out of town; 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    Prior Function Level of Independence: Independent         Comments: No longer drives     Hand Dominance   Dominant Hand: Right    Extremity/Trunk Assessment   Upper Extremity Assessment Upper Extremity Assessment: Defer  to OT evaluation    Lower Extremity Assessment Lower Extremity Assessment: LLE deficits/detail LLE Deficits / Details: AROM WFL, strength hip flexion 3+/5, knee extension 4-/5, ankle DF 4-/5        Communication   Communication: HOH (decreased hearing L ear)  Cognition Arousal/Alertness: Awake/alert Behavior During Therapy: WFL for tasks assessed/performed (tearful relating the change in his life since stroke) Overall Cognitive Status: Within Functional Limits for tasks assessed                      General Comments General comments (skin integrity, edema, etc.): Patient with nausea increased and heaving, no vomiting after walking and negotiating stairs.  Discussed likely due to TIA and not sleeping last pm.    Exercises     Assessment/Plan    PT Assessment Patient needs continued PT services  PT Problem List Decreased balance;Decreased activity tolerance;Decreased mobility;Decreased safety awareness          PT Treatment Interventions Therapeutic activities;Gait training;Therapeutic exercise;Patient/family education;Stair training;Balance training;Functional mobility training;Other (comment) (vestibular rehab)    PT Goals (Current goals can be found in the Care Plan section)  Acute Rehab PT Goals Patient Stated Goal: to get my old self back PT Goal Formulation: With patient Time For Goal Achievement: 06/23/16 Potential to Achieve Goals: Good    Frequency Min 4X/week   Barriers to discharge Decreased caregiver support      Co-evaluation               End of Session Equipment Utilized During Treatment: Gait belt Activity Tolerance: Other (comment) (limited by nausea) Patient left: in bed;with call bell/phone within reach      Functional Assessment Tool Used: Clinical Judgement Functional Limitation: Mobility: Walking and moving around Mobility: Walking and Moving Around Current Status [id number]): At least 1 percent but less than 20 percent impaired, limited or restricted Mobility: Walking and Moving Around Goal Status [id number]): At least 1 percent but less than 20 percent impaired, limited or restricted    Time: 0923-0947 PT Time Calculation (min)  (ACUTE ONLY): 24 min   Charges:   PT Evaluation $PT Eval Moderate Complexity: 1 Procedure PT Treatments $Gait Training: 8-22 mins   PT G Codes:   PT G-Codes **NOT FOR INPATIENT CLASS** Functional Assessment Tool Used: Clinical Judgement Functional Limitation: Mobility: Walking and moving around Mobility: Walking and Moving Around Current Status [id number]): At least 1 percent but less than 20 percent impaired, limited or restricted Mobility: Walking and Moving Around Goal Status [phone]): At least 1 percent but less than 20 percent impaired, limited or restricted    Reginia Naas 06/16/2016, 10:19 AM Magda Kiel, PT [phone] 06/16/2016

## 2016-06-16 NOTE — Evaluation (Signed)
Occupational Therapy Evaluation Patient Details Name: [name] MRN: [id number] DOB: 08-09-1955 Today's Date: 06/16/2016    History of Present Illness [name] is a 61 y.o. 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 Admitted this admssion for headache and left facial numbness with symptom resolvement.   Clinical Impression   This 61 yo male admitted with above presents to acute OT with deficits below (see OT problem list) thus affecting pt's safety and independence with basic ADLs and IADLs. He will benefit from acute OT without need for follow up.     Follow Up Recommendations  No OT follow up    Equipment Recommendations  Other (comment) (I have recommened a tub seat, but pt does not want to consider one)       Precautions / Restrictions Precautions Precautions: Fall Restrictions Weight Bearing Restrictions: No      Mobility Bed Mobility Overal bed mobility: Independent                Transfers Overall transfer level: Needs assistance Equipment used: None Transfers: Sit to/from Stand Sit to Stand: Supervision         General transfer comment: S due to history of vertigo that makes him "fall out". He says he has also been diagnosed with orthostatic HTN (this did not show during my session with vitals checked)    Balance Overall balance assessment: Needs assistance Sitting-balance support: No upper extremity supported;Feet supported Sitting balance-Leahy Scale: Good     Standing balance support: No upper extremity supported Standing balance-Leahy Scale: Fair                              ADL Overall ADL's : Needs assistance/impaired                                       General ADL Comments: S overall  due to he has a history of "falling out" due to vertigo since CVA 8 months ago     Vision Vision Assessment?: Yes Eye Alignment: Within Functional Limits Ocular Range of Motion: Within Functional Limits Alignment/Gaze Preference: Within Defined Limits Tracking/Visual Pursuits:  (when he tracks to left he cannot maintain due to the "feeling" it gives him (his eyes automatically shoot back to midline). However when you have him look to the left with eyes only he is able to maintain to the left) Visual Fields: Left inferior homonymous quadranopsia          Pertinent Vitals/Pain Pain Assessment: No/denies pain     Hand Dominance Right   Extremity/Trunk Assessment Upper Extremity Assessment Upper Extremity Assessment: Overall WFL for tasks assessed           Communication Communication Communication: Expressive difficulties (has trouble getting words out at times)   Cognition Arousal/Alertness: Awake/alert Behavior During Therapy: Flat affect Overall Cognitive Status: Within Functional Limits for tasks assessed                                Home Living Family/patient expects to be discharged to:: Private residence Living Arrangements: Alone Available Help at Discharge: Family;Available PRN/intermittently Type of Home: Apartment  Home Access: Stairs to enter Entrance Stairs-Number of Steps: 3 + 14 Entrance Stairs-Rails: Left;Right;Can reach both Home Layout: One level     Bathroom Shower/Tub: Corporate investment banker: Standard Bathroom Accessibility: Yes   Home Equipment: None   Additional Comments: dtr and sister live out of town; 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      Prior Functioning/Environment Level of Independence: Independent        Comments: No longer drives        OT Problem List: Impaired vision/perception;Impaired balance (sitting and/or standing)   OT  Treatment/Interventions: Self-care/ADL training;Therapeutic activities;Visual/perceptual remediation/compensation;Patient/family education;Balance training    OT Goals(Current goals can be found in the care plan section) Acute Rehab OT Goals Patient Stated Goal: to get my old self back OT Goal Formulation: With patient Time For Goal Achievement: 06/23/16 Potential to Achieve Goals: Good  OT Frequency: Min 2X/week   Barriers to D/C: Decreased caregiver support             End of Session Equipment Utilized During Treatment: Gait belt  Activity Tolerance: Patient tolerated treatment well Patient left: in chair;with call bell/phone within reach;with chair alarm set   Time: [id number] OT Time Calculation (min): 35 min Charges:  OT General Charges $OT Visit: 1 Procedure OT Evaluation $OT Eval Moderate Complexity: 1 Procedure OT Treatments $Self Care/Home Management : 8-22 mins G-Codes: OT G-codes **NOT FOR INPATIENT CLASS** Functional Assessment Tool Used:  (clinical observation) Functional Limitation: Self care Self Care Current Status [id number]): At least 1 percent but less than 20 percent impaired, limited or restricted Self Care Goal Status [id number]): At least 1 percent but less than 20 percent impaired, limited or restricted  Almon Register [id number] 06/16/2016, 9:17 AM

## 2016-06-16 NOTE — Care Management Note (Signed)
Case Management Note  Patient Details  Name: [name] MRN: [id number] Date of Birth: 07/22/1955  Subjective/Objective:                  Patient presented with HA, left facial numbness. Lives at home alone. CM will follow for discharge needs pending PT/OT evals and physician orders.   Action/Plan:   Expected Discharge Date:                  Expected Discharge Plan:     In-House Referral:     Discharge planning Services     Post Acute Care Choice:    Choice offered to:     DME Arranged:    DME Agency:     HH Arranged:    HH Agency:     Status of Service:     If discussed at [employer] of Stay Meetings, dates discussed:    Additional Comments:  Rolm Baptise, RN 06/16/2016, 11:54 AM

## 2016-06-16 NOTE — Progress Notes (Signed)
  Echocardiogram 2D Echocardiogram with Definity has been performed.  [name] 06/16/2016, 11:09 AM

## 2016-06-16 NOTE — Discharge Summary (Signed)
Physician Discharge Summary  [name] [id number] DOB: 12/30/1955 DOA: 06/15/2016  PCP: Berkley Harvey, NP  Admit date: 06/15/2016 Discharge date: 06/16/2016  Admitted From: Home Disposition:  home  Recommendations for Outpatient Follow-up:  1. Follow up with PCP in 1-2 weeks 2. Please obtain BMP/CBC in one week 3. Please follow up on the following pending results:  Home Health:PT   Discharge Condition:Stable CODE STATUS:Full Diet recommendation: Heart healthy, diabetic   Brief/Interim Summary: 61 y.o. male with Past medical history of Cryptogenic stroke, generalized anxiety disorder, depression, HLD, HTN, OSA, migraine, MCA aneurysm, Loop recorder implant. The patient presented with complaints of headache started this afternoon, roughly 1 hour prior to arrival, followed by numbness involving the left side of the cheek as well as upper lips. He since this symptoms did not improve he decided to come to the ER. At the time of my evaluation symptoms totally resolved. He denies any vomiting but did have some nausea initially which is currently resolved. No chest pain and abdominal pain, no diarrhea no constipation active bleeding. Recently started on Lexapro for anxiety and depression and referred to psychiatry by his neurologist. Recently he was also told that he has orthostatic hypotension and was recommend to eat high salt diet  In the ED, code stroke activated, symptoms resolved on its own therefore no TPA given. Patient recommended to be admitted for further workup  1. Weakness Neurology consulted, symptoms resolved by time of admission MRI brain MRA brain neg for acute CVA Carotid Doppler and echo ordered, both unremarkable Neurology recommended to continue aspirin and Plavix. Continued Lipitor. PTOT and speech therapy consulted with recs for home health PT Discussed case with Neurology, pt is cleared for discharge with close f/u with primary Neurologist  2. Depression  and anxiety. Continuing home medications.  3. Prediabetes versus type 2 diabetes mellitus. Remained stable  4. Migraine. Patient does have history of competent Migratine as well as headache.  Remained stable  Discharge Diagnoses:  Principal Problem:   TIA (transient ischemic attack) Active Problems:   Hyperlipidemia   Depression   Type 2 diabetes mellitus (HCC)   Benign essential HTN   Middle cerebral aneurysm   Gait disturbance, post-stroke   Headache, migraine    Discharge Instructions   Allergies as of 06/16/2016   No Known Allergies     Medication List    STOP taking these medications   guaiFENesin-dextromethorphan 100-10 MG/5ML syrup Commonly known as:  ROBITUSSIN DM     TAKE these medications   amLODipine 10 MG tablet Commonly known as:  NORVASC Take 1 tablet (10 mg total) by mouth daily.   aspirin 325 MG EC tablet Take 1 tablet (325 mg total) by mouth daily.   atorvastatin 40 MG tablet Commonly known as:  LIPITOR Take 1 tablet (40 mg total) by mouth daily at 6 PM.   chlorthalidone 25 MG tablet Commonly known as:  HYGROTON Take 1 tablet (25 mg total) by mouth daily.   clopidogrel 75 MG tablet Commonly known as:  PLAVIX Take 1 tablet (75 mg total) by mouth daily.   escitalopram 20 MG tablet Commonly known as:  LEXAPRO Take 1 tablet (20 mg total) by mouth daily.   fluticasone 50 MCG/ACT nasal spray Commonly known as:  FLONASE Place 2 sprays into both nostrils daily as needed for allergies.   lisinopril 20 MG tablet Commonly known as:  PRINIVIL,ZESTRIL Take 2 tablets (40 mg total) by mouth daily.   metoprolol 50 MG tablet Commonly  known as:  LOPRESSOR Take 1 tablet (50 mg total) by mouth 2 (two) times daily.   triamcinolone ointment 0.1 % Commonly known as:  KENALOG Apply 1 application topically See admin instructions. Apply twice daily to affected area for 1 week, then apply twice daily as needed for irritation/rash   Wrist Splint  Left/Right Misc Bilateral cock-up wrist splints for carpal tunnel      Follow-up Information    Berkley Harvey, NP. Schedule an appointment as soon as possible for a visit in 1 week(s).   Specialty:  Nurse Practitioner Contact information: [place] [address] [id number]        Nancy Fetter, MD. Schedule an appointment as soon as possible for a visit in 2 week(s).   Specialty:  Student Contact information: [place] [address] [phone]          No Known Allergies  Consultations:  Neurology/Stroke Team  Procedures/Studies: Ct Angio Head W Or Wo Contrast  Result Date: 06/15/2016 CLINICAL DATA:  61 y/o  M; follow up of code stroke. EXAM: CT ANGIOGRAPHY HEAD AND NECK TECHNIQUE: Multidetector CT imaging of the head and neck was performed using the standard protocol during bolus administration of intravenous contrast. Multiplanar CT image reconstructions and MIPs were obtained to evaluate the vascular anatomy. Carotid stenosis measurements (when applicable) are obtained utilizing NASCET criteria, using the distal internal carotid diameter as the denominator. CONTRAST:  75 cc Isovue 370 COMPARISON:  06/15/2016 CT of the head. 09/20/2015 CT angiogram head and neck. FINDINGS: CTA NECK FINDINGS Aortic arch: Mild motion degradation. No aneurysm of the aortic arch or dissection. Bovine anatomy, normal variant. Right carotid system: No evidence of dissection, stenosis (50% or greater) or occlusion. Left carotid system: No evidence of dissection, stenosis (50% or greater) or occlusion. Minimal calcified plaque of the left carotid bifurcation. Vertebral arteries: Left dominant. Right vertebral artery origin obscured by streak artifact from contrast bolus. No evidence of dissection, stenosis (50% or greater) or occlusion. Skeleton: Stable moderate cervical spondylosis with both discogenic and facet degenerative changes. Degenerative changes are  greatest at the C5 through C7 levels where there is pronounced disc space narrowing. Canal stenosis is probably greatest at the C6-7 level where it is at least moderate there multiple levels of mild to moderate bony neural foraminal narrowing. Other neck: Stable nodule in left lobe of thyroid measuring 7 mm and subcentimeter nodules in right thyroid lobe. Upper chest: 4 mm pulmonary nodule along the major fissure (series 501, image 32) present on prior study, likely benign. Review of the MIP images confirms the above findings CTA HEAD FINDINGS Anterior circulation: No significant stenosis, proximal occlusion, aneurysm, or vascular malformation. Mild stable position of ACA pipeline stent. There is a faint blush of increased density within the region of the prior aneurysm (series 501, image 143) which may represent minimal enhancement or mineralization of the thrombosed aneurysm. No discrete aneurysm lumen is identified. Posterior circulation: No significant stenosis, proximal occlusion, aneurysm, or vascular malformation. Venous sinuses: As permitted by contrast timing, patent. Anatomic variants: Partial Azygos ACA. Patent anterior communicating artery. Right fetal PCA with large right posterior communicating artery and small right P1 segment. Delayed phase: No abnormal intracranial enhancement. Review of the MIP images confirms the above findings IMPRESSION: 1. No significant stenosis, proximal occlusion, or vascular malformation of circle of Willis. 2. Stable ACA pipeline stent. Faint blush of density in the location of prior aneurysm may represent mineralization or minimal enhancement  of thrombosed aneurysm. No residual aneurysm lumen. 3. Carotid and vertebral arteries of the neck are widely patent. These results were called by telephone at the time of interpretation on 06/15/2016 at 10:19 pm to Dr. Zenia Resides, who verbally acknowledged these results. Electronically Signed   By: Kristine Garbe M.D.   On:  06/15/2016 22:22   Ct Angio Neck W Or Wo Contrast  Result Date: 06/15/2016 CLINICAL DATA:  61 y/o  M; follow up of code stroke. EXAM: CT ANGIOGRAPHY HEAD AND NECK TECHNIQUE: Multidetector CT imaging of the head and neck was performed using the standard protocol during bolus administration of intravenous contrast. Multiplanar CT image reconstructions and MIPs were obtained to evaluate the vascular anatomy. Carotid stenosis measurements (when applicable) are obtained utilizing NASCET criteria, using the distal internal carotid diameter as the denominator. CONTRAST:  75 cc Isovue 370 COMPARISON:  06/15/2016 CT of the head. 09/20/2015 CT angiogram head and neck. FINDINGS: CTA NECK FINDINGS Aortic arch: Mild motion degradation. No aneurysm of the aortic arch or dissection. Bovine anatomy, normal variant. Right carotid system: No evidence of dissection, stenosis (50% or greater) or occlusion. Left carotid system: No evidence of dissection, stenosis (50% or greater) or occlusion. Minimal calcified plaque of the left carotid bifurcation. Vertebral arteries: Left dominant. Right vertebral artery origin obscured by streak artifact from contrast bolus. No evidence of dissection, stenosis (50% or greater) or occlusion. Skeleton: Stable moderate cervical spondylosis with both discogenic and facet degenerative changes. Degenerative changes are greatest at the C5 through C7 levels where there is pronounced disc space narrowing. Canal stenosis is probably greatest at the C6-7 level where it is at least moderate there multiple levels of mild to moderate bony neural foraminal narrowing. Other neck: Stable nodule in left lobe of thyroid measuring 7 mm and subcentimeter nodules in right thyroid lobe. Upper chest: 4 mm pulmonary nodule along the major fissure (series 501, image 32) present on prior study, likely benign. Review of the MIP images confirms the above findings CTA HEAD FINDINGS Anterior circulation: No significant  stenosis, proximal occlusion, aneurysm, or vascular malformation. Mild stable position of ACA pipeline stent. There is a faint blush of increased density within the region of the prior aneurysm (series 501, image 143) which may represent minimal enhancement or mineralization of the thrombosed aneurysm. No discrete aneurysm lumen is identified. Posterior circulation: No significant stenosis, proximal occlusion, aneurysm, or vascular malformation. Venous sinuses: As permitted by contrast timing, patent. Anatomic variants: Partial Azygos ACA. Patent anterior communicating artery. Right fetal PCA with large right posterior communicating artery and small right P1 segment. Delayed phase: No abnormal intracranial enhancement. Review of the MIP images confirms the above findings IMPRESSION: 1. No significant stenosis, proximal occlusion, or vascular malformation of circle of Willis. 2. Stable ACA pipeline stent. Faint blush of density in the location of prior aneurysm may represent mineralization or minimal enhancement of thrombosed aneurysm. No residual aneurysm lumen. 3. Carotid and vertebral arteries of the neck are widely patent. These results were called by telephone at the time of interpretation on 06/15/2016 at 10:19 pm to Dr. Zenia Resides, who verbally acknowledged these results. Electronically Signed   By: Kristine Garbe M.D.   On: 06/15/2016 22:22   Mr Brain Wo Contrast  Result Date: 06/15/2016 CLINICAL DATA:  61 y/o M; headache followed by numbness involving the left-sided cheek and upper lips. EXAM: MRI HEAD WITHOUT CONTRAST TECHNIQUE: Multiplanar, multiecho pulse sequences of the brain and surrounding structures were obtained without intravenous contrast. COMPARISON:  06/15/2016  CT angiogram head and neck and CT head. 09/21/2015 MRI of the brain. FINDINGS: Brain: Small area of diffusion hyperintensity surrounds the pipeline stent which is likely due to susceptibility artifact effect from the presence of  the stent. There is no diffusion restriction to suggest acute or early subacute infarct. No new abnormal susceptibility hypointensity to suggest interval intracranial hemorrhage. Several scattered foci of T2 FLAIR hyperintense signal abnormality in subcortical and periventricular white matter are stable from the prior MRI and compatible with mild chronic microvascular ischemic changes. Additionally, there are stable T2 hyperintense FLAIR foci within the right corona radiata, left thalamus, and in the pons likely representing chronic lacunar infarcts and stable. Stable ventricle size. No focal mass effect. No extra-axial collection. Vascular: Susceptibility artifact from [place] pipeline stent noted. Skull and upper cervical spine: Normal marrow signal. Sinuses/Orbits: Paranasal sinus disease predominantly in ethmoid and maxillary sinuses with mucous retention cyst. No abnormal signal of mastoid air cells. Orbits are unremarkable. Other: None. IMPRESSION: 1. No acute intracranial abnormality identified. 2. Susceptibility artifact from the [place] pipeline stent noted. No flow-void in the region of prior aneurysm to suggest residual aneurysm lumen. 3. Stable mild chronic microvascular ischemic changes and parenchymal volume loss of the brain. 4. Mild paranasal sinus disease. Electronically Signed   By: Kristine Garbe M.D.   On: 06/15/2016 22:57   Ct Head Code Stroke W/o Cm  Result Date: 06/15/2016 CLINICAL DATA:  Code stroke. LEFT cheek numbness. 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 History of hypertension, diabetes. EXAM: CT HEAD WITHOUT CONTRAST TECHNIQUE: Contiguous axial images were obtained from the base of the skull through the vertex without intravenous contrast. COMPARISON:  CT HEAD March 23, 2016 and MRI head December 24, 2015 FINDINGS: BRAIN: The ventricles and sulci are normal for age. No intraparenchymal hemorrhage, mass effect nor midline shift. Patchy similar  supratentorial white matter hypodensities compatible with chronic small vessel ischemic disease. Old basal ganglia lacunar infarcts. No acute large vascular territory infarcts. No abnormal extra-axial fluid collections. Basal cisterns are patent. VASCULAR: Pipeline stent anterior cerebral artery. SKULL: No skull fracture. No significant scalp soft tissue swelling. SINUSES/ORBITS: The mastoid air-cells and included paranasal sinuses are well-aerated.The included ocular globes and orbital contents are non-suspicious. OTHER: None. ASPECTS [facility] Stroke Program Early CT Score) - Ganglionic level infarction (caudate, lentiform nuclei, internal capsule, insula, M1-M3 cortex): 7 - Supraganglionic infarction (M4-M6 cortex): 3 Total score (0-10 with 10 being normal): 10 IMPRESSION: 1. No acute intracranial process. Stable examination: ACA pipeline stent. Mild chronic small vessel ischemic disease and old basal ganglia lacunar infarcts. 2. ASPECTS is 10. Critical Value/emergent results were called by telephone at the time of interpretation on 06/15/2016 at 4:14 pm to Dr. Lawana Pai, who verbally acknowledged these results. Electronically Signed   By: Elon Alas M.D.   On: 06/15/2016 16:15    Subjective: No complaints  Discharge Exam: Vitals:   06/16/16 1045 06/16/16 1305  BP: 117/67 121/83  Pulse: 83 85  Resp: 16 15  Temp: 98.6 F (37 C) 98.7 F (37.1 C)   Vitals:   06/16/16 0700 06/16/16 0900 06/16/16 1045 06/16/16 1305  BP: 122/74 (!) 148/80 117/67 121/83  Pulse: 78 93 83 85  Resp: 16  16 15   Temp: 98.4 F (36.9 C)  98.6 F (37 C) 98.7 F (37.1 C)  TempSrc: Oral  Oral Oral  SpO2: 95% 95% 95% 98%  Weight:      Height:        General:  Pt is alert, awake, not in acute distress Cardiovascular: RRR, S1/S2 +, no rubs, no gallops Respiratory: CTA bilaterally, no wheezing, no rhonchi Abdominal: Soft, NT, ND, bowel sounds + Extremities: no edema, no cyanosis   The results of significant  diagnostics from this hospitalization (including imaging, microbiology, ancillary and laboratory) are listed below for reference.     Microbiology: No results found for this or any previous visit (from the past 240 hour(s)).   Labs: BNP (last 3 results) No results for input(s): BNP in the last 8760 hours. Basic Metabolic Panel:  Recent Labs Lab 06/15/16 1546 06/15/16 1557 06/16/16 0803  NA 136 138 138  K 3.6 3.7 3.4*  CL 101 99* 99*  CO2 24  --  27  GLUCOSE 278* 277* 169*  BUN 18 22* 17  CREATININE 0.98 0.90 0.94  CALCIUM 9.2  --  9.2   Liver Function Tests:  Recent Labs Lab 06/15/16 1546  AST 29  ALT 38  ALKPHOS 89  BILITOT 0.6  PROT 7.0  ALBUMIN 3.6   No results for input(s): LIPASE, AMYLASE in the last 168 hours. No results for input(s): AMMONIA in the last 168 hours. CBC:  Recent Labs Lab 06/15/16 1546 06/15/16 1557 06/16/16 0803  WBC 12.2*  --  13.4*  NEUTROABS 8.6*  --   --   HGB 14.1 14.6 14.7  HCT 41.8 43.0 43.5  MCV 85.7  --  85.8  PLT 367  --  378   Cardiac Enzymes: No results for input(s): CKTOTAL, CKMB, CKMBINDEX, TROPONINI in the last 168 hours. BNP: Invalid input(s): POCBNP CBG:  Recent Labs Lab 06/16/16 1136  GLUCAP 204*   D-Dimer No results for input(s): DDIMER in the last 72 hours. Hgb A1c No results for input(s): HGBA1C in the last 72 hours. Lipid Profile  Recent Labs  06/16/16 0803  CHOL 173  HDL 38*  LDLCALC 94  TRIG 203*  CHOLHDL 4.6   Thyroid function studies No results for input(s): TSH, T4TOTAL, T3FREE, THYROIDAB in the last 72 hours.  Invalid input(s): FREET3 Anemia work up No results for input(s): VITAMINB12, FOLATE, FERRITIN, TIBC, IRON, RETICCTPCT in the last 72 hours. Urinalysis    Component Value Date/Time   COLORURINE YELLOW 12/25/2015 2115   APPEARANCEUR CLEAR 12/25/2015 2115   LABSPEC 1.018 12/25/2015 2115   PHURINE 7.0 12/25/2015 2115   GLUCOSEU NEGATIVE 12/25/2015 2115   HGBUR NEGATIVE  12/25/2015 2115   HGBUR small 02/06/2008 0856   BILIRUBINUR NEGATIVE 12/25/2015 2115   KETONESUR NEGATIVE 12/25/2015 2115   PROTEINUR NEGATIVE 12/25/2015 2115   UROBILINOGEN 0.2 02/06/2008 1047   NITRITE NEGATIVE 12/25/2015 2115   LEUKOCYTESUR NEGATIVE 12/25/2015 2115   Sepsis Labs Invalid input(s): PROCALCITONIN,  WBC,  LACTICIDVEN Microbiology No results found for this or any previous visit (from the past 240 hour(s)).   SIGNED:   Donne Hazel, MD  [facility] 06/16/2016, 1:21 PM  If 7PM-7AM, please contact night-coverage [email or web address] Password [email or web address]

## 2016-06-16 NOTE — Evaluation (Signed)
Speech Language Pathology Evaluation Patient Details Name: [name] MRN: [id number] DOB: 03-30-1956 Today's Date: 06/16/2016 Time: 1555-1610 SLP Time Calculation (min) (ACUTE ONLY): 15 min  Problem List:  Patient Active Problem List   Diagnosis Date Noted  . CVA (cerebral vascular accident) ([place]) 06/15/2016  . Pain of upper abdomen   . Numbness   . Carpal tunnel syndrome, bilateral 12/16/2015  . Status post stroke 11/12/2015  . Transient neurologic deficit 10/15/2015  . Cerebrovascular accident, late effects 10/07/2015  . Complicated migraine   . Anxiety state   . 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 Cerebral infarction due to embolism of right middle cerebral artery ([place]) 09/23/2015  . Cerebral infarction due to embolism of cerebral artery ([place]) 09/23/2015  . Gait disturbance, post-stroke   . Drooping of mouth   . Cerebrovascular accident (CVA) due to embolism of right anterior cerebral artery ([place])   . Benign essential HTN   . Migraine with aura and without status migrainosus, not intractable   . Tachypnea   . Prediabetes   . Acute blood loss anemia   . Brain aneurysm 09/20/2015  . Type 2 diabetes mellitus ([place]) 09/15/2015  . Leukocytosis 09/15/2015  . Aneurysm, cerebral, nonruptured 09/15/2015  . TIA (transient ischemic attack) 09/14/2015  . Temporary cerebral vascular dysfunction 09/14/2015  . H/O transient cerebral ischemia 07/29/2015  . At risk for falling 07/29/2015  . Bilateral hearing loss 10/25/2014  . Abnormal fear 08/08/2012  . Headache, migraine 08/08/2012  . Arthritis, degenerative 08/08/2012  . History of tear of ACL (anterior cruciate ligament) 03/08/2011  . History of knee problem 03/08/2011  . Depression 09/07/2010  . Depressive disorder, not elsewhere classified 09/07/2010  . URI (upper respiratory infection) 08/24/2010  . Hearing loss 09/06/2006  . Hyperlipidemia 04/23/2006  . Essential hypertension 04/23/2006  . INSOMNIA 04/23/2006   Past  Medical History:  Past Medical History:  Diagnosis Date  . Allergy   . Anxiety   . Cataract   . Cerebral hemorrhage ([place])   . Chest pain, atypical    12/2003:  negative cardiolyte  . Depression   . Drug abuse   . Eczema   . Erectile dysfunction   . History of meniscal tear    bilateral  . HLD (hyperlipidemia)   . HTN (hypertension)   . Hx of tear of ACL (anterior cruciate ligament)    right  . Insomnia   . Olecranon bursitis of left elbow 10/2009   s/p I&D by Dr Maxie Better, initially assessed by Dr. Nori Riis   . Prediabetes   . Stroke ([place])   . TIA (transient ischemic attack)    Past Surgical History:  Past Surgical History:  Procedure Laterality Date  . EP IMPLANTABLE DEVICE N/A 12/30/2015   Procedure: Loop Recorder Insertion;  Surgeon: Thompson Grayer, MD;  Location: [facility];  Service: Cardiovascular;  Laterality: N/A;  . INCISE AND DRAIN ABCESS     L elbow due to cellulitis/bursitis  . INNER EAR SURGERY    . IR GENERIC HISTORICAL  12/28/2015   IR ANGIO VERTEBRAL SEL VERTEBRAL UNI L MOD SED 12/28/2015 Luanne Bras, MD [facility]  . IR GENERIC HISTORICAL  12/28/2015   IR ANGIO VERTEBRAL SEL SUBCLAVIAN INNOMINATE UNI R MOD SED 12/28/2015 Luanne Bras, MD [facility]  . IR GENERIC HISTORICAL  12/28/2015   IR ANGIO INTRA EXTRACRAN SEL INTERNAL CAROTID BILAT MOD SED 12/28/2015 Luanne Bras, MD [facility]  . KNEE ARTHROSCOPY    . RADIOLOGY WITH ANESTHESIA  N/A 09/20/2015   Procedure: EMBOLIZATION         (RADIOLOGY WITH ANESTHESIA);  Surgeon: Luanne Bras, MD;  Location: [facility];  Service: Radiology;  Laterality: N/A;  . SKIN TAG REMOVAL     11 removed  . TEE WITHOUT CARDIOVERSION N/A 12/30/2015   Procedure: TRANSESOPHAGEAL ECHOCARDIOGRAM (TEE);  Surgeon: Larey Dresser, MD;  [facility];  Service: Cardiovascular;  Laterality: N/A;   HPI:  [name] is a 61 y.o. male with Past medical history of Cryptogenic stroke (8 months ago--right frontal cortex,  high right parietal cortex, and posterior right parietal cortex), generalized anxiety disorder, depression, HLD, HTN, OSA, migraine, MCA aneurysm, Loop recorder implant. Admiite this admssion for headache and left facial numbness. MRI revealed No acute intracranial abnormality, Stable mild chronic microvascular ischemic changes and parenchymal volume loss of the brain, Mild paranasal sinus disease. Pt received skilled ST services for cognitive communication during prior admission in 09/2015.   Assessment / Plan / Recommendation Clinical Impression  Patient presents with report of functional decline in cognitive communication status including problems "keeping up" in conversations and short term memory. Administered MOCA and patient scored WNL (28/30), however based on patient complains, suspect higher level deficits. Patient would benefit from follow-up with [facility] to further assess cognitive communication and impact on functional communication. Patient is discharging; no further follow-up in acute care. Recommending HH at this time.    SLP Assessment  All further Speech Lanaguage Pathology  needs can be addressed in the next venue of care    Follow Up Recommendations  Home health SLP    Frequency and Duration           SLP Evaluation Cognition  Overall Cognitive Status: History of cognitive impairments - at baseline Arousal/Alertness: Awake/alert Orientation Level: Oriented X4 Attention: Sustained;Focused Focused Attention: Impaired Focused Attention Impairment: Functional complex Sustained Attention: Impaired Sustained Attention Impairment: Functional complex Memory: Appears intact Awareness: Appears intact Problem Solving: Appears intact Executive Function: Reasoning;Sequencing;Organizing;Decision Making;Self Monitoring;Self Correcting;Initiating Reasoning: Appears intact Sequencing: Appears intact Organizing: Appears intact Decision Making: Appears intact Initiating: Appears  intact Self Monitoring: Appears intact Self Correcting: Appears intact       Comprehension  Auditory Comprehension Overall Auditory Comprehension: Appears within functional limits for tasks assessed Visual Recognition/Discrimination Discrimination: Within Function Limits Reading Comprehension Reading Status: Not tested    Expression Expression Primary Mode of Expression: Verbal Verbal Expression Overall Verbal Expression: Appears within functional limits for tasks assessed Written Expression Dominant Hand: Right Written Expression: Not tested   Oral / Motor  Oral Motor/Sensory Function Overall Oral Motor/Sensory Function: Within functional limits Motor Speech Overall Motor Speech: Appears within functional limits for tasks assessed   [place], Melvin CF-SLP Speech-Language Pathologist [phone] Aliene Altes 06/16/2016, 4:22 PM

## 2016-06-16 NOTE — Progress Notes (Signed)
STROKE TEAM PROGRESS NOTE   HISTORY OF PRESENT ILLNESS (per record) [name] is an 61 y.o. male. Who was brought to the ED as a code stroke for left facial numbness and slight left leg weakness.  Patient states he started having a headache earlier in the day.  Then a bit later he noticed his left cheek was numb and also had numbness of the lips and tongue on both sides.  He then became anxious and thought the left leg might be a bit week.  The symptoms quickly improved and now in the ED he only has slight numbness of the lips.  He states he takes ASA and Plavix daily along with Lipitor.  He does have a h/o TIA and stroke in the past along with a treated brain aneurysm. Patient was not administered IV t-PA. He was admitted for further evaluation and treatment.    SUBJECTIVE (INTERVAL HISTORY) Echo technologist is in the room along with the nurse. Pt stated that his symptoms resolved. Symptoms started yesterday along with HA, right facial numbness. Now all resolved. So far stroke work up negative and he follows with [facility].    OBJECTIVE Temp:  [98 F (36.7 C)-98.8 F (37.1 C)] 98.4 F (36.9 C) (01/19 0700) Pulse Rate:  [70-93] 93 (01/19 0900) Cardiac Rhythm: Normal sinus rhythm (01/19 0700) Resp:  [15-20] 16 (01/19 0700) BP: (116-148)/(72-98) 148/80 (01/19 0900) SpO2:  [93 %-97 %] 95 % (01/19 0900) Weight:  [122.6 kg (270 lb 4.5 oz)-124.2 kg (273 lb 13 oz)] 122.6 kg (270 lb 4.5 oz) (01/18 2300)  CBC:  Recent Labs Lab 06/15/16 1546 06/15/16 1557 06/16/16 0803  WBC 12.2*  --  13.4*  NEUTROABS 8.6*  --   --   HGB 14.1 14.6 14.7  HCT 41.8 43.0 43.5  MCV 85.7  --  85.8  PLT 367  --  [id number]    Basic Metabolic Panel:  Recent Labs Lab 06/15/16 1546 06/15/16 1557 06/16/16 0803  NA 136 138 138  K 3.6 3.7 3.4*  CL 101 99* 99*  CO2 24  --  27  GLUCOSE 278* 277* 169*  BUN 18 22* 17  CREATININE 0.98 0.90 0.94  CALCIUM 9.2  --  9.2    Lipid Panel:    Component  Value Date/Time   CHOL 173 06/16/2016 0803   TRIG 203 (H) 06/16/2016 0803   HDL 38 (L) 06/16/2016 0803   CHOLHDL 4.6 06/16/2016 0803   VLDL 41 (H) 06/16/2016 0803   LDLCALC 94 06/16/2016 0803   HgbA1c:  Lab Results  Component Value Date   HGBA1C 6.2 (H) 12/28/2015   Urine Drug Screen:    Component Value Date/Time   LABOPIA NONE DETECTED 12/25/2015 2115   COCAINSCRNUR NONE DETECTED 12/25/2015 2115   COCAINSCRNUR NEG 06/07/2006 0039   LABBENZ NONE DETECTED 12/25/2015 2115   LABBENZ NEG 06/07/2006 0039   AMPHETMU NONE DETECTED 12/25/2015 2115   THCU NONE DETECTED 12/25/2015 2115   LABBARB NONE DETECTED 12/25/2015 2115      IMAGING I have personally reviewed the radiological images below and agree with the radiology interpretations.  Ct Head Code Stroke W/o Cm 06/15/2016 1. No acute intracranial process. Stable examination: ACA pipeline stent. Mild chronic small vessel ischemic disease and old basal ganglia lacunar infarcts. 2. ASPECTS is 10.   Ct Angio Head W Or Wo Contrast Ct Angio Neck W Or Wo Contrast 06/15/2016 1. No significant stenosis, proximal occlusion, or vascular malformation of circle  of Willis. 2. Stable ACA pipeline stent. Faint blush of density in the location of prior aneurysm may represent mineralization or minimal enhancement of thrombosed aneurysm. No residual aneurysm lumen. 3. Carotid and vertebral arteries of the neck are widely patent.   Mr Brain Wo Contrast 06/15/2016 1. No acute intracranial abnormality identified. 2. Susceptibility artifact from the [place] pipeline stent noted. No flow-void in the region of prior aneurysm to suggest residual aneurysm lumen. 3. Stable mild chronic microvascular ischemic changes and parenchymal volume loss of the brain. 4. Mild paranasal sinus disease.   Carotid Doppler   There is 1-39% bilateral ICA stenosis. Vertebral artery flow is antegrade.   TTE - Left ventricle: The cavity size was normal. Wall thickness was    increased in a pattern of mild LVH. Systolic function was normal.   The estimated ejection fraction was in the range of 60% to 65%.   Wall motion was normal; there were no regional wall motion   abnormalities. Doppler parameters are consistent with abnormal   left ventricular relaxation (grade 1 diastolic dysfunction). - Aortic valve: Trileaflet; mildly thickened, mildly calcified   leaflets. - Right ventricle: The cavity size was mildly dilated. Wall   thickness was normal. - Pericardium, extracardiac: A prominent pericardial fat pad was   present. Impressions: - No cardiac source of emboli was indentified. Compared to the   prior study, there has been no significant interval change.   PHYSICAL EXAM  Temp:  [98 F (36.7 C)-98.8 F (37.1 C)] 98.7 F (37.1 C) (01/19 1305) Pulse Rate:  [70-93] 85 (01/19 1305) Resp:  [15-18] 15 (01/19 1305) BP: (116-148)/(67-98) 121/83 (01/19 1305) SpO2:  [93 %-98 %] 98 % (01/19 1305) Weight:  [270 lb 4.5 oz (122.6 kg)] 270 lb 4.5 oz (122.6 kg) (01/18 2300)  General - Well nourished, well developed, in no apparent distress.  Ophthalmologic - Sharp disc margins OU.   Cardiovascular - Regular rate and rhythm.  Mental Status -  Level of arousal and orientation to time, place, and person were intact. Language including expression, naming, repetition, comprehension was assessed and found intact. Attention span and concentration were normal. Fund of Knowledge was assessed and was intact.  Cranial Nerves II - XII - II - Visual field intact OU. III, IV, VI - Extraocular movements intact. V - Facial sensation intact bilaterally. VII - Facial movement intact bilaterally. VIII - Hearing & vestibular intact bilaterally. X - Palate elevates symmetrically. XI - Chin turning & shoulder shrug intact bilaterally. XII - Tongue protrusion intact.  Motor Strength - The patient's strength was normal in all extremities and pronator drift was absent.  Bulk  was normal and fasciculations were absent.   Motor Tone - Muscle tone was assessed at the neck and appendages and was normal.  Reflexes - The patient's reflexes were 1+ in all extremities and he had no pathological reflexes.  Sensory - Light touch, temperature/pinprick were assessed and were symmetrical.    Coordination - The patient had normal movements in the hands with no ataxia or dysmetria.  Tremor was absent.  Gait and Station - deferred due to in the middle of a procedure.   ASSESSMENT/PLAN [name] is a 61 y.o. male with history of cryptogenic stroke, generalized anxiety disorder, depression, hyperlipidemia, hypertension, obstructive sleep apnea, migraine, MCA aneurysm and loop recorder presenting with headache and left facial numbness. He did not receive IV t-PA.   Headache and left facial numbness History of right scalp, face and arm  numbness - likely complicated migraine   Code stroke CT normal   CTA head and neck  no significant stenosis. Stable ACA pipeline stent with no residual aneurysm lumen   MRI  no acute stroke. Susceptibility artifact from the ACA pipeline stent noted  Carotid Doppler no significant stenosis   2D Echo  EF 60-65%  LDL 94  HgbA1c pending  Loop recorder  Lovenox 40 mg sq daily for VTE prophylaxis  Diet heart healthy/carb modified Room service appropriate? Yes; Fluid consistency: Thin  aspirin 325 mg daily and clopidogrel 75 mg daily prior to admission, now on aspirin 325 mg daily and clopidogrel 75 mg daily. Continue DAPT on discharge.  Patient counseled to be compliant with his antithrombotic medications  Ongoing aggressive stroke risk factor management  Therapy recommendations:  HHPT, no OT  Disposition:  Home today  Bilateral hand numbness  Questionable for neuropathy  Followed by Dr. Felecia Shelling in the past  Now follows with Dr. Debe Coder at [facility]  Hypertension  Stable  BP goal  normotensive  Hyperlipidemia  Home meds:  Lipitor 40, resumed in hospital  LDL 94, goal < 70  Continue statin at discharge  Other Stroke Risk Factors  Advanced age  Morbid Obesity, Body mass index is 41.1 kg/m., recommend weight loss, diet and exercise as appropriate   Hx stroke/TIA  12/2015 . Incidental right frontal infarct in setting of recent R ACA pipeline stent, loop recorder placed to rule out atrial fibrillation - so far loop negative for afib  Family hx stroke (mother)  Migraines  Other Active Problems  Depression and anxiety  Hospital day # 0  Neurology will sign off. Please call with questions. Pt will follow up with Dr. Debe Coder at [facility]. Thanks for the consult.  Rosalin Hawking, MD PhD Stroke Neurology 06/16/2016 4:33 PM   To contact Stroke Continuity provider, please refer to [email or web address] After hours, contact General Neurology

## 2016-06-17 LAB — HEMOGLOBIN A1C
Hgb A1c MFr Bld: 8 % — ABNORMAL HIGH (ref 4.8–5.6)
Mean Plasma Glucose: 183 mg/dL

## 2016-06-27 ENCOUNTER — Ambulatory Visit (INDEPENDENT_AMBULATORY_CARE_PROVIDER_SITE_OTHER): Payer: Managed Care, Other (non HMO) | Admitting: *Deleted

## 2016-06-27 DIAGNOSIS — I639 Cerebral infarction, unspecified: Secondary | ICD-10-CM | POA: Diagnosis not present

## 2016-06-28 NOTE — Progress Notes (Signed)
Carelink Summary Report / Loop Recorder 

## 2016-07-05 ENCOUNTER — Telehealth (HOSPITAL_COMMUNITY): Payer: Self-pay

## 2016-07-05 NOTE — Telephone Encounter (Signed)
Pt agreed to f/u in 6 months with angiogram. AW 

## 2016-07-05 NOTE — Telephone Encounter (Signed)
Left message for pt to call back. AW 

## 2016-07-10 LAB — CUP PACEART REMOTE DEVICE CHECK
Date Time Interrogation Session: 20171231231107
Implantable Pulse Generator Implant Date: 20170803

## 2016-07-20 ENCOUNTER — Encounter (INDEPENDENT_AMBULATORY_CARE_PROVIDER_SITE_OTHER): Payer: Self-pay

## 2016-07-20 ENCOUNTER — Encounter (HOSPITAL_COMMUNITY): Payer: Self-pay | Admitting: Psychiatry

## 2016-07-20 ENCOUNTER — Ambulatory Visit (INDEPENDENT_AMBULATORY_CARE_PROVIDER_SITE_OTHER): Payer: 59 | Admitting: Psychiatry

## 2016-07-20 VITALS — BP 150/92 | HR 102 | Ht 68.0 in | Wt 266.6 lb

## 2016-07-20 DIAGNOSIS — Z79899 Other long term (current) drug therapy: Secondary | ICD-10-CM | POA: Diagnosis not present

## 2016-07-20 DIAGNOSIS — Z7982 Long term (current) use of aspirin: Secondary | ICD-10-CM

## 2016-07-20 DIAGNOSIS — F332 Major depressive disorder, recurrent severe without psychotic features: Secondary | ICD-10-CM | POA: Diagnosis not present

## 2016-07-20 DIAGNOSIS — F5104 Psychophysiologic insomnia: Secondary | ICD-10-CM | POA: Diagnosis not present

## 2016-07-20 DIAGNOSIS — F482 Pseudobulbar affect: Secondary | ICD-10-CM | POA: Insufficient documentation

## 2016-07-20 DIAGNOSIS — Z810 Family history of intellectual disabilities: Secondary | ICD-10-CM | POA: Diagnosis not present

## 2016-07-20 MED ORDER — FLUOXETINE HCL 20 MG PO CAPS: 20.0000 mg | ORAL_CAPSULE | Freq: Every day | ORAL | 2 refills | Status: DC

## 2016-07-20 MED ORDER — OLANZAPINE 2.5 MG PO TABS: 2.5000 mg | ORAL_TABLET | Freq: Every day | ORAL | 2 refills | Status: DC

## 2016-07-20 NOTE — Progress Notes (Signed)
Psychiatric Initial Adult Assessment   Patient Identification: [name] MRN:  [id number] Date of Evaluation:  07/20/2016 Referral Source: patient, self Chief Complaint:  severe depression Visit Diagnosis:    ICD-9-CM ICD-10-CM   1. Severe episode of recurrent major depressive disorder, without psychotic features ([place]) 296.33 F33.2   2. Pseudobulbar affect 310.81 F48.2    lability of mood and tearfulness  3. Psychophysiological insomnia 307.42 F51.04     History of Present Illness:  [name] is a 61 year old male with a significant medical history of cryptogenic stroke, MCA aneurysm, HTN, HLD, OSA, Migraine headaches, and a history of anxiety and depression.  He has sustained injury to right frontal cortex, high right parietal cortex, and posterior right parietal cortex. This is complicated by microvascular disease. On presentation today, he is notably tearful and labile in his interaction with nursing when having his vitals taken.  When patient and writer began speaking, he was visibly tearful, and would positive for periods to cry. He continuously apologized for his tearfulness, and continued with pseudobulbar lability and tearfulness throughout our interaction. He reports that his substantial health issues over the past year have taken a toll on his mental and emotional well-being. He realizes that not only has he sustained damage to the parts of his brain associated with emotions, but this is in the setting of pre-existing depression and anxiety struggles. He reports that he has trouble looking forward to life, and doesn't know why "God would keep me this way". He has contemplated suicide, but his family support and his hope to see his grandchild being born in 3 months, has kept him from doing anything to take his life. I spent time with the patient, validating his difficulties, and expressing grief with him about his circumstances.  He admits that he thinks about death and dying  often, but has never taken any steps to end his life.  He identifies as [place], and his faith is a protective factor for him. He does not have a firearm at home, or accessible to him.    He shares that it's very difficult for him to get around, attend all his ADLs, remember things, he is no longer able to drive, he feels guilty and like a burden to much of his family, he has difficulty concentrating, and he contrasts this from what he used to be like last year. 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 Now, he feels embarrassed, and ashamed of his level of debility.  He feels that his dignity has been taken.  He reports that he has substantial difficulty sleeping at night, and this is a very frustrating process for him, as he has terrible nightmares, and maybe only gets about 20 minutes of sleep per night. He also reports that he has racing thoughts every night, and can't seem to "shut off". He reports that various medications have been tried, including Xanax, trazodone, clonazepam. He reports that he has never had any benefit from any of these medicines, because his brain and mind just won't shut off.  Regarding his past struggle with depression and anxiety he reports that he has always been a "up and down " person in terms of his mood, and at previous points in his life, providers had thought that maybe he was bipolar. He does not describe any episodes typical for mania  but does describe mood lability.  He reports that he had a difficult childhood with his mom passing away at age 76 and dad passing away when he was age 80.  He reports that he has worked all his life since age 23, and has struggled with significant periods of poverty, but worked hard to give whatever he could to his family. He is close with his 2 children who are in their 62s, and he is close with his  siblings.  I spent time with the patient discussing medication options, including a switch from Lexapro to fluoxetine. He had been on Prozac in the past with good results. I also cited the FLAME study which has looked at fluoxetine used after CVA. Regarding his sleep, I explored various options with the patient, but ultimately suggested that we consider using Zyprexa. I warned him about the risks associated with antipsychotic use in the elderly with cardiovascular disease. However weighing the risks and benefits, including his struggle with pseudobulbar affect lability, Zyprexa may improve his quality of life substantially.  Patient agrees to proceed as discussed and return to clinic in 2 weeks. He was tearful throughout the interview off and on, expressed gratitude to the writer for not "judging me" for his tearfulness and lability.  Associated Signs/Symptoms: Depression Symptoms:  depressed mood, anhedonia, insomnia, psychomotor retardation, fatigue, feelings of worthlessness/guilt, difficulty concentrating, hopelessness, recurrent thoughts of death, (Hypo) Manic Symptoms:  Distractibility, Irritable Mood, Labiality of Mood, Anxiety Symptoms:  Excessive Worry, Social Anxiety, Psychotic Symptoms:  perseveration PTSD Symptoms: Negative  Past Psychiatric History: Patient has a history of anxiety and depression management by PCP, with previous success with Lexapro. He had been tried on Zoloft 100 mg daily but this was not effective.  Previous Psychotropic Medications: Yes   Substance Abuse History in the last 12 months:  No.  Consequences of Substance Abuse: Negative  Past Medical History:  Past Medical History:  Diagnosis Date  . Allergy   . Anxiety   . Cataract   . Cerebral hemorrhage ([place])   . Chest pain, atypical    12/2003:  negative cardiolyte  . Depression   . Drug abuse   . Eczema   . Erectile dysfunction   . History of meniscal tear    bilateral  . HLD  (hyperlipidemia)   . HTN (hypertension)   . Hx of tear of ACL (anterior cruciate ligament)    right  . Insomnia   . Olecranon bursitis of left elbow 10/2009   s/p I&D by Dr Maxie Better, initially assessed by Dr. Nori Riis   . Prediabetes   . Stroke ([place])   . TIA (transient ischemic attack)     Past Surgical History:  Procedure Laterality Date  . EP IMPLANTABLE DEVICE N/A 12/30/2015   Procedure: Loop Recorder Insertion;  Surgeon: Thompson Grayer, MD;  Location: [facility];  Service: Cardiovascular;  Laterality: N/A;  . INCISE AND DRAIN ABCESS     L elbow due to cellulitis/bursitis  . INNER EAR SURGERY    . IR GENERIC HISTORICAL  12/28/2015   IR ANGIO VERTEBRAL SEL VERTEBRAL UNI L MOD SED 12/28/2015 Luanne Bras, MD [facility]  . IR GENERIC HISTORICAL  12/28/2015   IR ANGIO VERTEBRAL SEL SUBCLAVIAN INNOMINATE UNI R MOD SED 12/28/2015 Luanne Bras, MD [facility]  . IR GENERIC HISTORICAL  12/28/2015   IR ANGIO INTRA EXTRACRAN SEL INTERNAL CAROTID BILAT MOD SED 12/28/2015 Luanne Bras, MD [facility]  . KNEE ARTHROSCOPY    .  RADIOLOGY WITH ANESTHESIA N/A 09/20/2015   Procedure: EMBOLIZATION         (RADIOLOGY WITH ANESTHESIA);  Surgeon: Luanne Bras, MD;  Location: [facility];  Service: Radiology;  Laterality: N/A;  . SKIN TAG REMOVAL     11 removed  . TEE WITHOUT CARDIOVERSION N/A 12/30/2015   Procedure: TRANSESOPHAGEAL ECHOCARDIOGRAM (TEE);  Surgeon: Larey Dresser, MD;  Location: [facility];  Service: Cardiovascular;  Laterality: N/A;    Family Psychiatric History: Patient reports a family psychiatric history of strokes and Alzheimer's  Family History:  Family History  Problem Relation Age of Onset  . Stroke Mother   . Hypertension Mother   . Aneurysm Mother 48    Died of brain aneursym  . Heart failure Father   . Emphysema Father   . Diabetes Mellitus II Sister   . Colon cancer Neg Hx   . Rectal cancer Neg Hx   . Stomach cancer Neg Hx     Social History:   Social  History   Social History  . Marital status: Divorced    Spouse name: N/A  . Number of children: N/A  . Years of education: N/A   Social History Main Topics  . Smoking status: Never Smoker  . Smokeless tobacco: Never Used  . Alcohol use No  . Drug use: No  . Sexual activity: No   Other Topics Concern  . 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  He used to work at [employer], but has not been able to work any longer because of his CVA. 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  Allergies:  No Known Allergies  Metabolic Disorder Labs: Lab Results  Component Value Date   HGBA1C 8.0 (H) 06/16/2016   MPG 183 06/16/2016   MPG 131 12/28/2015   No results found for: PROLACTIN Lab Results  Component Value Date   CHOL 173 06/16/2016   TRIG 203 (H) 06/16/2016   HDL 38 (L) 06/16/2016   CHOLHDL 4.6 06/16/2016   VLDL 41 (H) 06/16/2016   LDLCALC 94 06/16/2016   LDLCALC 140 (H) 12/28/2015     Current Medications: Current Outpatient Prescriptions  Medication Sig Dispense Refill  . amLODipine (NORVASC) 10 MG tablet Take 1 tablet (10 mg total) by mouth daily. 30 tablet 0  . aspirin 325 MG EC tablet Take 1 tablet (325 mg total) by mouth daily. 30 tablet 0  . atorvastatin (LIPITOR) 40 MG tablet Take 1 tablet (40 mg total) by mouth daily at 6 PM. 30 tablet 0  . chlorthalidone (HYGROTON) 25 MG tablet Take 1 tablet (25 mg total) by mouth daily. 30 tablet 0  . clopidogrel (PLAVIX) 75 MG tablet Take 1 tablet (75 mg total) by mouth daily. 30 tablet 0  . Elastic Bandages & Supports (WRIST SPLINT LEFT/RIGHT) MISC Bilateral  cock-up wrist splints for carpal tunnel 2 each 0  . FLUoxetine (PROZAC) 20 MG capsule Take 1 capsule (20 mg total) by mouth daily. 30 capsule 2  . fluticasone (FLONASE) 50 MCG/ACT nasal spray Place 2 sprays into both nostrils daily as needed for allergies. (  Patient not taking: Reported on 06/15/2016) 16 g 0  . lisinopril (PRINIVIL,ZESTRIL) 20 MG tablet Take 2 tablets (40 mg total) by mouth daily. 30 tablet 0  . metoprolol (LOPRESSOR) 50 MG tablet Take 1 tablet (50 mg total) by mouth 2 (two) times daily. 60 tablet 0  . OLANZapine (ZYPREXA) 2.5 MG tablet Take 1 tablet (2.5 mg total) by mouth at bedtime. Okay to take 2 at night if needed 30 tablet 2  . triamcinolone ointment (KENALOG) 0.1 % Apply 1 application topically See admin instructions. Apply twice daily to affected area for 1 week, then apply twice daily as needed for irritation/rash 30 g 0   No current facility-administered medications for this visit.     Neurologic: Headache: Yes Seizure: Negative Paresthesias:Negative  Musculoskeletal: Strength & Muscle Tone: within normal limits Gait & Station: shuffle Patient leans: N/A  Psychiatric Specialty Exam: Review of Systems  Constitutional: Negative.   HENT: Positive for hearing loss.   Respiratory: Negative.   Cardiovascular: Negative.   Gastrointestinal: Negative.   Genitourinary: Negative.   Musculoskeletal: Positive for back pain and joint pain.  Neurological: Positive for dizziness, speech change and headaches.  Psychiatric/Behavioral: Positive for depression, memory loss and suicidal ideas. The patient has insomnia.     Blood pressure (!) 150/92, pulse (!) 102, height 5\' 8"  (1.727 m), weight 120.9 kg (266 lb 9.6 oz).Body mass index is 40.54 kg/m.  General Appearance: Casual and Fairly Groomed  Eye Contact:  Fair  Speech:  Clear and Coherent and Slow  Volume:  Normal  Mood:  labile, tearful  Affect:  labile, pseudobulbar affect  Thought Process:  Goal Directed   Orientation:  Full (Time, Place, and Person)  Thought Content:  Logical and Rumination  Suicidal Thoughts:  Yes.  without intent/plan  Homicidal Thoughts:  No  Memory:  Immediate;   Fair  Judgement:  Fair  Insight:  Fair and Present  Psychomotor Activity:  Normal  Concentration:  Concentration: Fair and Attention Span: Fair  Recall:  NA  Fund of Knowledge:Fair  Language: Fair  Akathisia:  Negative  Handed:  Right  AIMS (if indicated):  n/a  Assets:  Communication Skills Desire for Improvement Financial Resources/Insurance Housing Leisure Time Social Support Transportation  ADL's:  Intact  Cognition: Impaired,  Mild  Sleep:  Reports 20 minutes off and on, tossing and turning all night    Treatment Plan Summary: [name] is a 61 year old male with a psychiatric history of depression and anxiety. He has multiple medical contributors including numerous CVAs, and an MCA aneurysm, with injuries affecting right frontal lobe and parietal lobes. He presents with pseudobulbar affect, with mood lability and tearfulness throughout our interview. He does not present as grossly psychotic or manic. He has had substantial difficulty in coping with his numerous health issues recently. He reports continued depressive symptoms, hopelessness, and continued thoughts about death and dying.  He reports that he has strong family support, a strong sense of faith, and is hopeful to see his grandchild born in 33 months.  He has moderate to high risk factors for suicide, but does not appear to be acutely planning or intending to harm himself.  His sense of depression and grief has been ongoing for approximately 8 months and he has not made any attempts to harm himself.  He does not have access to firearms.  His quality of life has been significantly effected by his illness.  He has worked all his life, earned high degree black belts  in martial arts, raised two successful children, and has lived a physically  active and productive lifestyle. His sense of hopelessness and grief about his medical status is very understandable.  He responded very well today, to being heard, and validated in his grief, and then was able to engage in a productive discussion about how best to address his mood and sleep symptoms.  I believe he will benefit from medication changes as below, with continued close follow-up.  Major Depressive Disorder, Depression secondary to medical illness - Discontinue Lexapro - Initiate fluoxetine 20 mg daily, FLAME studies indicate benefit post stroke  Pseudobulbar affect, mood lability, insomnia - Initiate Zyprexa 2.5 mg qhs, okay to increase to 5 mg qhs as tolerated - Reviewed the risks and benefits, including the black box warning given his age and cardiovascular risk factors.  The potential benefits outweigh the risks at this time, as Zyprexa may be effective in reducing his mood lability and helping him to have a restful sleep  Aundra Dubin, MD 2/22/20184:15 PM

## 2016-07-20 NOTE — Patient Instructions (Signed)
STOP Lexapro  START Prozac 20 mg daily  START Zyprexa 2.5 mg at night, okay to take 5 mg (2 tablets) if needed

## 2016-07-26 ENCOUNTER — Telehealth (HOSPITAL_COMMUNITY): Payer: Self-pay

## 2016-07-26 LAB — CUP PACEART REMOTE DEVICE CHECK
Implantable Pulse Generator Implant Date: 20170803
MDC IDC SESS DTM: 20180131001353

## 2016-07-26 NOTE — Telephone Encounter (Signed)
I received a call from Santiago Glad with [employer], she is helping the patient with his social security benefits. Collateral states that patient told her he wants to kill himself, he told her he is trying to find a weapon (he does not currently have one) Collateral was worried enough to call us. I spoke with Dr. Daron Offer and he felt we should call [employer] to do a well check. I called and spoke to [employer] - he took the pertinent information and will call me after they make contact with the patient.

## 2016-07-27 ENCOUNTER — Ambulatory Visit (INDEPENDENT_AMBULATORY_CARE_PROVIDER_SITE_OTHER): Payer: Managed Care, Other (non HMO) | Admitting: *Deleted

## 2016-07-27 DIAGNOSIS — I639 Cerebral infarction, unspecified: Secondary | ICD-10-CM | POA: Diagnosis not present

## 2016-07-31 NOTE — Progress Notes (Signed)
Carelink Summary Report / Loop Recorder 

## 2016-08-02 ENCOUNTER — Telehealth (HOSPITAL_COMMUNITY): Payer: Self-pay

## 2016-08-02 ENCOUNTER — Other Ambulatory Visit: Payer: Self-pay | Admitting: Psychiatry

## 2016-08-02 MED ORDER — OLANZAPINE 7.5 MG PO TABS: 7.5000 mg | ORAL_TABLET | Freq: Every day | ORAL | 2 refills | Status: DC

## 2016-08-02 NOTE — Telephone Encounter (Signed)
Patient is calling with questions about his medication, he did not specify, but he was adamant that he had to talk to you. Patients call back number is [phone]

## 2016-08-02 NOTE — Telephone Encounter (Signed)
No worries Im calling him now

## 2016-08-03 ENCOUNTER — Ambulatory Visit (HOSPITAL_COMMUNITY): Payer: Self-pay | Admitting: Psychiatry

## 2016-08-08 ENCOUNTER — Ambulatory Visit (INDEPENDENT_AMBULATORY_CARE_PROVIDER_SITE_OTHER): Payer: 59 | Admitting: Psychiatry

## 2016-08-08 DIAGNOSIS — Z79899 Other long term (current) drug therapy: Secondary | ICD-10-CM

## 2016-08-08 DIAGNOSIS — Z7982 Long term (current) use of aspirin: Secondary | ICD-10-CM | POA: Diagnosis not present

## 2016-08-08 DIAGNOSIS — F5104 Psychophysiologic insomnia: Secondary | ICD-10-CM

## 2016-08-08 DIAGNOSIS — F332 Major depressive disorder, recurrent severe without psychotic features: Secondary | ICD-10-CM | POA: Diagnosis not present

## 2016-08-08 DIAGNOSIS — F482 Pseudobulbar affect: Secondary | ICD-10-CM | POA: Diagnosis not present

## 2016-08-08 MED ORDER — FLUOXETINE HCL 40 MG PO CAPS: 40.0000 mg | ORAL_CAPSULE | Freq: Every day | ORAL | 0 refills | Status: DC

## 2016-08-08 NOTE — Progress Notes (Signed)
BH MD/PA/NP OP Progress Note  08/08/2016 12:18 PM [name]  MRN:  [id number]  Chief Complaint: still some nightmares, but doing better  Subjective:  Patient reports that he is doing a lot better. He reports that he feels like he is getting back to being his usual self. He reports that he's had the motivation to actually clean up his house, do some chores, go for a walk. He reports that he is decided he is going to get a dog, something that he had avoided considering at our last session, for fear that he would end up being too depressed to take care of the dog. He reports that his sister, and his daughters, who have told him that he is getting back to being his usual self. He reports that he is sleeping very well at night, and reports that he is sleeping about 8-9 hours nightly. He reports that he still has some nightmares and sometimes weird dreams about being buried alive, or being trapped, and admits that he is very claustrophobic, so he has more of these dreams during periods of anxiety or stress.  He describes experiences consistent with hypnagogic and hypnopompic hallucinations, and describes seeing a figure at the corner of his bed as he was emerging from being awake, and even swatted at the figure before it eventually faded away from his vision.  Spent time reassuring the patient, and discussing internal coping strategies.  He reports that he has tolerated Zyprexa without any issue, but notes that his primary care physician has been worried about the increased blood sugar. I reassured the patient, that the risks of elevated glucose, are far outweighed by the benefits he has seen in his mood, desire to go on living, and sleep. Discussed that the goal will be eventually to taper and discontinue Zyprexa, but in the short-term it isn't effective treatment for some of his up-and-down mood, severe depression, sleep difficulties, and suicidal thinking due to depression.  In her conversation, the  patient was able to reflect on many positive interactions he's had over the past 2 weeks, and he expressed much less pessimism. He was notably much less emotionally labile, and did not have any episodes of crying or tearfulness during our encounter.  The patient and I spoke on the phone last week, when he was notably upset about billing costs that he continued to receive from [facility]. I validated the patient's frustration, and grief and receiving these calls, and provided him with the medical assistance program pamphlet to go over with his family.  Discussed increasing fluoxetine to 40 mg daily, to further target some of the REM associated sleep difficulties, and to further target some of his depressed mood, ruminative symptoms, poor frustration tolerance.   At present, the patient denies any suicidal thoughts. He admits that he was in a very dark place a few weeks ago, but now he is "180 difference". I spoke with the patient's sister briefly, who was present at the office, and she reports that she feels like she has her brother back, and expressed gratitude to Probation officer.  Discussed follow-up in 3 weeks, so we can consider tapering regimen for the Zyprexa.  Visit Diagnosis:    ICD-9-CM ICD-10-CM   1. 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 Severe episode of recurrent major depressive disorder, without psychotic features ([place]) 296.33 F33.2   3. 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 Reviewed, with no updates at  this time.   Past Medical History:  Past Medical History:  Diagnosis Date  . Allergy   . Anxiety   . Cataract   . Cerebral hemorrhage ([place])   . Chest pain, atypical    12/2003:  negative cardiolyte  . Depression   . Drug abuse   . Eczema   . Erectile dysfunction   . History of meniscal tear    bilateral  . HLD (hyperlipidemia)   . HTN (hypertension)   . Hx of tear of ACL (anterior cruciate ligament)    right  .  Insomnia   . Olecranon bursitis of left elbow 10/2009   s/p I&D by Dr Maxie Better, initially assessed by Dr. Nori Riis   . Prediabetes   . Stroke ([place])   . TIA (transient ischemic attack)     Past Surgical History:  Procedure Laterality Date  . EP IMPLANTABLE DEVICE N/A 12/30/2015   Procedure: Loop Recorder Insertion;  Surgeon: Thompson Grayer, MD;  Location: [place] [facility];  Service: Cardiovascular;  Laterality: N/A;  . INCISE AND DRAIN ABCESS     L elbow due to cellulitis/bursitis  . INNER EAR SURGERY    . IR GENERIC HISTORICAL  12/28/2015   IR ANGIO VERTEBRAL SEL VERTEBRAL UNI L MOD SED 12/28/2015 Luanne Bras, MD [facility]  . IR GENERIC HISTORICAL  12/28/2015   IR ANGIO VERTEBRAL SEL SUBCLAVIAN INNOMINATE UNI R MOD SED 12/28/2015 Luanne Bras, MD [facility]  . IR GENERIC HISTORICAL  12/28/2015   IR ANGIO INTRA EXTRACRAN SEL INTERNAL CAROTID BILAT MOD SED 12/28/2015 Luanne Bras, MD [facility]  . KNEE ARTHROSCOPY    . RADIOLOGY WITH ANESTHESIA N/A 09/20/2015   Procedure: EMBOLIZATION         (RADIOLOGY WITH ANESTHESIA);  Surgeon: Luanne Bras, MD;  Location: [place];  Service: Radiology;  Laterality: N/A;  . SKIN TAG REMOVAL     11 removed  . TEE WITHOUT CARDIOVERSION N/A 12/30/2015   Procedure: TRANSESOPHAGEAL ECHOCARDIOGRAM (TEE);  Surgeon: Larey Dresser, MD;  Location: [facility];  Service: Cardiovascular;  Laterality: N/A;    Family Psychiatric History: See intake H&P for full details. Reviewed, with no updates at this time.   Family History:  Family History  Problem Relation Age of Onset  . Stroke Mother   . Hypertension Mother   . Aneurysm Mother 82    Died of brain aneursym  . Heart failure Father   . Emphysema Father   . Diabetes Mellitus II Sister   . Colon cancer Neg Hx   . Rectal cancer Neg Hx   . Stomach cancer Neg Hx     Social History:  Social History   Social History  . Marital status: Divorced    Spouse name: N/A  . Number of children: N/A   . Years of education: N/A   Social History Main Topics  . Smoking status: Never Smoker  . Smokeless tobacco: Never Used  . Alcohol use No  . Drug use: No  . Sexual activity: No   Other Topics Concern  . 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 amLODipine (NORVASC) 10 MG tablet Take 1 tablet (10 mg total) by mouth daily. 30 tablet 0  . aspirin 325 MG EC tablet Take 1 tablet (325 mg total) by mouth daily. 30 tablet 0  . atorvastatin (LIPITOR) 40 MG tablet Take 1 tablet (40 mg total) by mouth daily at 6 PM. 30 tablet 0  . chlorthalidone (HYGROTON) 25 MG tablet Take 1 tablet (25 mg total) by mouth daily. 30 tablet 0  . clopidogrel (PLAVIX) 75 MG tablet Take 1 tablet (75 mg total) by mouth daily. 30 tablet 0  . Elastic Bandages & Supports (WRIST SPLINT LEFT/RIGHT) MISC Bilateral cock-up wrist splints for carpal tunnel 2 each 0  . FLUoxetine (PROZAC) 40 MG capsule Take 1 capsule (40 mg total) by mouth daily. 90 capsule 0  . fluticasone (FLONASE) 50 MCG/ACT nasal spray Place 2 sprays into both nostrils daily as needed for allergies. (Patient not taking: Reported on 06/15/2016) 16 g 0  . lisinopril (PRINIVIL,ZESTRIL) 20 MG tablet Take 2 tablets (40 mg total) by mouth daily. 30 tablet 0  . metoprolol (LOPRESSOR) 50 MG tablet Take 1 tablet (50 mg total) by  mouth 2 (two) times daily. 60 tablet 0  . OLANZapine (ZYPREXA) 7.5 MG tablet Take 1 tablet (7.5 mg total) by mouth at bedtime. Okay to take 2 at night if needed 30 tablet 2  . triamcinolone ointment (KENALOG) 0.1 % Apply 1 application topically See admin instructions. Apply twice daily to affected area for 1 week, then apply twice daily as needed for irritation/rash 30 g 0   No current facility-administered medications for this visit.     Neurologic: Headache: Negative Seizure: Negative Paresthesias: Negative  Musculoskeletal: Strength & Muscle Tone: within normal limits Gait & Station: normal Patient leans: N/A  Psychiatric Specialty Exam: ROS  There were no vitals taken for this visit.There is no height or weight on file to calculate BMI.  General Appearance: Casual and Fairly Groomed  Eye Contact:  Good  Speech:  Clear and Coherent  Volume:  Normal  Mood:  Euthymic and hopeful  Affect:  Appropriate and Congruent  Thought Process:  Goal Directed  Orientation:  Full (Time, Place, and Person)  Thought Content: Logical   Suicidal Thoughts:  No  Homicidal Thoughts:  No  Memory:  Immediate;   Good  Judgement:  Good  Insight:  Good  Psychomotor Activity:  Normal  Concentration:  Concentration: Fair  Recall:  NA  Fund of Knowledge: Good  Language: Good  Akathisia:  Negative  Handed:  Right  AIMS (if indicated):  n/a  Assets:  Communication Skills Desire for Improvement Financial Resources/Insurance Housing Intimacy Leisure Time Social Support Transportation  ADL's:  Intact  Cognition: WNL  Sleep:  8-9 hours nightly   Treatment Plan Summary: REEVE TURNLEY is a 61 year old male with no significant psychiatric history, who presents for follow-up medication management. He presents with symptoms of major depressive disorder, severe, with recent suicidal thinking, and an affective lability consistent with pseudobulbar affect. The patient also presents with internal  subjective emotional lability, and as such we have titrated Zyprexa, in addition to Prozac for aggressive treatment of his depressive  symptoms. He has had a significant improvement in the past 2 weeks with fluoxetine and Zyprexa. Will further titrate fluoxetine, and ultimately the goal will be to taper and discontinue Zyprexa given the metabolically deleterious effects. I reviewed the risks and benefits of both medications with the patient. Although Zyprexa comes with a number of metabolic and cardiovascular risks, the patient's depression has been so severe that he has contemplated taking his life. He presents today with a reduction in suicidal thinking, and feels more hopeful about the future, which I largely attributed to the mood and sleep improvements associated with Zyprexa, as Prozac is only had a couple weeks to take effect.  I anticipate he will need Zyprexa on board for an additional 6-8 weeks until fluoxetine can be fully optimized and in effect.  He denies any acute suicidality today, and he has an excellent social support system. We'll follow-up in 3 weeks.  1. Pseudobulbar affect   2. Severe episode of recurrent major depressive disorder, without psychotic features ([place])   3. Psychophysiological insomnia    - Increase Prozac to 40 mg daily - Zyprexa maintained at 7.5 mg daily at bedtime - Patient understands the metabolic risks of Zyprexa, and the plan to eventually taper and discontinue this medication - Follow-up in 3 weeks - Patient's most recent HbA1c was on 1/19 - it was at 8.0, and this is before Zyprexa was initiated - Will follow-up with an additional HbA1c in May 2018  Aundra Dubin, MD 08/08/2016, 12:18 PM

## 2016-08-15 LAB — CUP PACEART REMOTE DEVICE CHECK
Implantable Pulse Generator Implant Date: 20170803
MDC IDC SESS DTM: 20180301234302

## 2016-08-28 ENCOUNTER — Ambulatory Visit (INDEPENDENT_AMBULATORY_CARE_PROVIDER_SITE_OTHER): Payer: Managed Care, Other (non HMO) | Admitting: *Deleted

## 2016-08-28 DIAGNOSIS — I639 Cerebral infarction, unspecified: Secondary | ICD-10-CM | POA: Diagnosis not present

## 2016-08-28 NOTE — Progress Notes (Signed)
Carelink Summary Report / Loop Recorder 

## 2016-08-30 LAB — CUP PACEART REMOTE DEVICE CHECK
Date Time Interrogation Session: 20180401001747
MDC IDC PG IMPLANT DT: 20170803

## 2016-09-06 ENCOUNTER — Ambulatory Visit (INDEPENDENT_AMBULATORY_CARE_PROVIDER_SITE_OTHER): Payer: Managed Care, Other (non HMO) | Admitting: Psychiatry

## 2016-09-06 VITALS — BP 130/71 | Ht 68.25 in | Wt 267.0 lb

## 2016-09-06 DIAGNOSIS — F482 Pseudobulbar affect: Secondary | ICD-10-CM

## 2016-09-06 DIAGNOSIS — F332 Major depressive disorder, recurrent severe without psychotic features: Secondary | ICD-10-CM

## 2016-09-06 DIAGNOSIS — F5104 Psychophysiologic insomnia: Secondary | ICD-10-CM | POA: Diagnosis not present

## 2016-09-06 DIAGNOSIS — Z79899 Other long term (current) drug therapy: Secondary | ICD-10-CM | POA: Diagnosis not present

## 2016-09-06 MED ORDER — FLUOXETINE HCL 20 MG PO CAPS: 20.0000 mg | ORAL_CAPSULE | Freq: Every day | ORAL | 1 refills | Status: DC

## 2016-09-06 MED ORDER — FLUOXETINE HCL 40 MG PO CAPS: 40.0000 mg | ORAL_CAPSULE | Freq: Every day | ORAL | 1 refills | Status: DC

## 2016-09-06 NOTE — Progress Notes (Signed)
BH MD/PA/NP OP Progress Note  09/06/2016 2:27 PM DAXON KYNE  MRN:  [id number]  Chief Complaint:  some irritability and rumination, overall much better  Subjective:  Star Age Syler presents today for medication management follow-up. He reports that he accidentally took a double dose of his medications today, as he put aside his medicines to take, but then accidentally took them out of his weekly medicine pillbox as well. I reviewed the medications with the patient, and reassured him, reviewed any symptoms to be mindful of, and confirmed that he can resume his normal daily scheduled tomorrow.  He reports that his mood and anxiety and depression have generally been very well managed. He reports that he is so grateful for the care, and shares that approximately 8 weeks ago when he came to this office he was feeling very depressed and suicidal and hopeless about life, and he feels like he's gotten his life back. He reports that he wants to live very much, and he likes himself again, despite the strokes, and his impairments. He reports that he generally is able to sleep very well with the Zyprexa at night. He slept 10 hours last night, and felt extremely well rested. He reports that he has some periods of irritability and he continues to be ruminative on any negative interactions that he might have in the day, but he is able to disconnect himself from those rumination thoughts much more easily. He reports that he no longer has any suicidal thoughts, and he feels hopeful for the future.  He reports that his family has been so pleased to have him back to his normal self in terms of mood. He is looking forward to his grandson who will be born in 4-5 months. He just celebrated his daughter's wedding recently. He reports that he has been working hard on cutting out sugar and sweets, and making sure to stay active and go for a short walk during the day. We discussed increasing his Prozac to 60 mg daily, one cap of  40 and 1 cap of 20.  We reviewed the risks and benefits, and the expectation that this would help with some of his ruminative symptoms and anxiety and stress tolerance. He was very much on board with the increase. We discussed that we would potentially be able to reduce Zyprexa to 5 mg at our follow-up, and he was also on board with this.  Visit Diagnosis:    ICD-9-CM ICD-10-CM   1. Severe episode of recurrent major depressive disorder, without psychotic features ([place]) 296.33 F33.2 FLUoxetine (PROZAC) 20 MG capsule     FLUoxetine (PROZAC) 40 MG capsule  2. 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 Reviewed, with no updates at this time.  Past Medical History:  Past Medical History:  Diagnosis Date  . Allergy   . Anxiety   . Cataract   . Cerebral hemorrhage ([place])   . Chest pain, atypical    12/2003:  negative cardiolyte  . Depression   . Drug abuse   . Eczema   . Erectile dysfunction   . History of meniscal tear    bilateral  . HLD (hyperlipidemia)   . HTN (hypertension)   . Hx of tear of ACL (anterior cruciate ligament)    right  . Insomnia   . Olecranon bursitis of left elbow 10/2009   s/p I&D by Dr Maxie Better, initially assessed by  Dr. Nori Riis   . Prediabetes   . Stroke ([place])   . TIA (transient ischemic attack)     Past Surgical History:  Procedure Laterality Date  . EP IMPLANTABLE DEVICE N/A 12/30/2015   Procedure: Loop Recorder Insertion;  Surgeon: Thompson Grayer, MD;  Location: [place] [facility];  Service: Cardiovascular;  Laterality: N/A;  . INCISE AND DRAIN ABCESS     L elbow due to cellulitis/bursitis  . INNER EAR SURGERY    . IR GENERIC HISTORICAL  12/28/2015   IR ANGIO VERTEBRAL SEL VERTEBRAL UNI L MOD SED 12/28/2015 Luanne Bras, MD [facility]  . IR GENERIC HISTORICAL  12/28/2015   IR ANGIO VERTEBRAL SEL SUBCLAVIAN INNOMINATE UNI R MOD SED 12/28/2015 Luanne Bras, MD  [facility]  . IR GENERIC HISTORICAL  12/28/2015   IR ANGIO INTRA EXTRACRAN SEL INTERNAL CAROTID BILAT MOD SED 12/28/2015 Luanne Bras, MD [facility]  . KNEE ARTHROSCOPY    . RADIOLOGY WITH ANESTHESIA N/A 09/20/2015   Procedure: EMBOLIZATION         (RADIOLOGY WITH ANESTHESIA);  Surgeon: Luanne Bras, MD;  Location: [place];  Service: Radiology;  Laterality: N/A;  . SKIN TAG REMOVAL     11 removed  . TEE WITHOUT CARDIOVERSION N/A 12/30/2015   Procedure: TRANSESOPHAGEAL ECHOCARDIOGRAM (TEE);  Surgeon: Larey Dresser, MD;  Location: [facility];  Service: Cardiovascular;  Laterality: N/A;    Family Psychiatric History: See intake H&P for full details. Reviewed, with no updates at this time.   Family History:  Family History  Problem Relation Age of Onset  . Stroke Mother   . Hypertension Mother   . Aneurysm Mother 53    Died of brain aneursym  . Heart failure Father   . Emphysema Father   . Diabetes Mellitus II Sister   . Colon cancer Neg Hx   . Rectal cancer Neg Hx   . Stomach cancer Neg Hx     Social History:  Social History   Social History  . Marital status: Divorced    Spouse name: N/A  . Number of children: N/A  . Years of education: N/A   Social History Main Topics  . Smoking status: Never Smoker  . Smokeless tobacco: Never Used  . Alcohol use No  . Drug use: No  . Sexual activity: No   Other Topics Concern  . 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 amLODipine (NORVASC) 10 MG tablet Take 1 tablet (10 mg total) by mouth daily. 30 tablet 0  . aspirin 325 MG EC tablet Take 1 tablet (325 mg total) by mouth daily. 30 tablet 0  . atorvastatin (LIPITOR) 40 MG tablet Take 1 tablet (40 mg total) by mouth daily at 6 PM. 30 tablet 0  .  chlorthalidone (HYGROTON) 25 MG tablet Take 1 tablet (25 mg total) by mouth daily. 30 tablet 0  . clopidogrel (PLAVIX) 75 MG tablet Take 1 tablet (75 mg total) by mouth daily. 30 tablet 0  . Elastic Bandages & Supports (WRIST SPLINT LEFT/RIGHT) MISC Bilateral cock-up wrist splints for carpal tunnel 2 each 0  . FLUoxetine (PROZAC) 20 MG capsule Take 1 capsule (20 mg total) by mouth daily. Take with 40 mg for a total of 60 mg 90 capsule 1  . FLUoxetine (PROZAC) 40 MG capsule Take 1 capsule (40 mg total) by mouth daily. Take with 20 mg for a total of 60 mg 90 capsule 1  . fluticasone (FLONASE) 50 MCG/ACT nasal spray Place 2 sprays into both nostrils daily as needed for allergies. (Patient not taking: Reported on 06/15/2016) 16 g 0  . lisinopril (PRINIVIL,ZESTRIL) 20 MG tablet Take 2 tablets (40 mg total) by mouth daily. 30 tablet 0  . metoprolol (LOPRESSOR) 50 MG tablet Take 1 tablet (50 mg total) by mouth 2 (two) times daily. 60 tablet 0  . OLANZapine (ZYPREXA) 7.5 MG tablet Take 1 tablet (7.5 mg total) by mouth at bedtime. Okay to take 2 at night if needed 30 tablet 2  . triamcinolone ointment (KENALOG) 0.1 % Apply 1 application topically See admin instructions. Apply twice daily to affected area for 1 week, then apply twice daily as needed for irritation/rash 30 g 0   No current facility-administered medications for this visit.     Neurologic: Headache: Negative Seizure: Negative Paresthesias: Negative  Musculoskeletal: Strength & Muscle Tone: within normal limits Gait  & Station: normal Patient leans: N/A  Psychiatric Specialty Exam: ROS  Blood pressure 130/71, height 5' 8.25" (1.734 m), weight 267 lb (121.1 kg).Body mass index is 40.3 kg/m.  General Appearance: Casual and Fairly Groomed  Eye Contact:  Good  Speech:  Clear and Coherent  Volume:  Normal  Mood:  Euthymic and hopeful  Affect:  Appropriate and Congruent  Thought Process:  Goal Directed  Orientation:  Full (Time, Place, and Person)  Thought Content: Logical   Suicidal Thoughts:  No  Homicidal Thoughts:  No  Memory:  Immediate;   Good  Judgement:  Good  Insight:  Good  Psychomotor Activity:  Normal  Concentration:  Concentration: Fair  Recall:  NA  Fund of Knowledge: Good  Language: Good  Akathisia:  Negative  Handed:  Right  AIMS (if indicated):  n/a  Assets:  Communication Skills Desire for Improvement Financial Resources/Insurance Housing Intimacy Leisure Time Social Support Transportation  ADL's:  Intact  Cognition: WNL  Sleep:  8-9 hours nightly   Treatment Plan Summary: KINGSTIN HEIMS is a 61 year old male with no significant psychiatric history, who presents for follow-up medication management. He presents with symptoms of major depressive disorder, severe, with recent suicidal thinking, and an affective lability consistent with pseudobulbar affect.  Over the past 8 weeks, with Prozac plus Zyprexa, he has had substantial improvements in his mood, resolution of suicidal thinking, and increased participation in his family and social activities.  His symptoms are significantly improved, but he would benefit from a further increase in Prozac to 60 mg. Ultimately we aim to taper and discontinue Zyprexa, but at this time the metabolic risks are far outweighed by the benefits that it has provided for the patient. He is working on behavioral and dietary strategies to reduce his glucose and weight, and has been able to show success with 4 pounds of weight loss.  He has an  excellent social support system, and agrees to follow-up with this Probation officer in 6 weeks.  1. Severe episode of recurrent major depressive disorder, without psychotic features ([place])   2. Pseudobulbar affect   3. Psychophysiological insomnia    - Increase Prozac to 60 mg daily; (prescribed as Fluoxetine 40 mg + Fluoxetine 20 mg) - Zyprexa maintained at 7.5 mg daily at bedtime for sleep and affective lability - Patient understands the metabolic risks of Zyprexa, and the plan to eventually taper and discontinue this medication - Follow-up in 6 weeks - Follow-up HbA1c per this clinic or primary care if drawn there  Aundra Dubin, MD 09/06/2016, 2:27 PM

## 2016-09-22 ENCOUNTER — Telehealth (HOSPITAL_COMMUNITY): Payer: Self-pay

## 2016-09-22 NOTE — Telephone Encounter (Signed)
Patient is calling, he said he is having a reaction to the Zyprexa - it is causing diarrhea and nausea. He only wanted to talk to you .

## 2016-09-22 NOTE — Telephone Encounter (Signed)
Okay all set. He wants to stop zyprexa immediately because of weight gain concerns.  I will send in restoril for him to start for sleep since he has taken other sleep aids without benefit. I reviewed the risks and benefits with him.  Thank you!

## 2016-09-25 ENCOUNTER — Ambulatory Visit (INDEPENDENT_AMBULATORY_CARE_PROVIDER_SITE_OTHER): Payer: Managed Care, Other (non HMO) | Admitting: *Deleted

## 2016-09-25 DIAGNOSIS — I639 Cerebral infarction, unspecified: Secondary | ICD-10-CM

## 2016-09-26 NOTE — Progress Notes (Signed)
Carelink Summary Report / Loop Recorder 

## 2016-10-01 ENCOUNTER — Inpatient Hospital Stay (HOSPITAL_COMMUNITY)
Admission: EM | Admit: 2016-10-01 | Discharge: 2016-10-05 | DRG: 392 | Disposition: A | Discharge status: Home Health | Payer: Managed Care, Other (non HMO) | Attending: Family Medicine | Admitting: Family Medicine

## 2016-10-01 ENCOUNTER — Inpatient Hospital Stay (HOSPITAL_COMMUNITY): Payer: Managed Care, Other (non HMO)

## 2016-10-01 ENCOUNTER — Encounter (HOSPITAL_COMMUNITY): Payer: Self-pay

## 2016-10-01 ENCOUNTER — Other Ambulatory Visit (HOSPITAL_COMMUNITY): Payer: Self-pay | Admitting: Psychiatry

## 2016-10-01 DIAGNOSIS — H9193 Unspecified hearing loss, bilateral: Secondary | ICD-10-CM | POA: Diagnosis present

## 2016-10-01 DIAGNOSIS — Z79899 Other long term (current) drug therapy: Secondary | ICD-10-CM

## 2016-10-01 DIAGNOSIS — D72829 Elevated white blood cell count, unspecified: Secondary | ICD-10-CM | POA: Diagnosis not present

## 2016-10-01 DIAGNOSIS — Z8249 Family history of ischemic heart disease and other diseases of the circulatory system: Secondary | ICD-10-CM

## 2016-10-01 DIAGNOSIS — R1110 Vomiting, unspecified: Secondary | ICD-10-CM

## 2016-10-01 DIAGNOSIS — K222 Esophageal obstruction: Secondary | ICD-10-CM

## 2016-10-01 DIAGNOSIS — K2100 Gastro-esophageal reflux disease with esophagitis, without bleeding: Secondary | ICD-10-CM

## 2016-10-01 DIAGNOSIS — R1310 Dysphagia, unspecified: Secondary | ICD-10-CM | POA: Diagnosis not present

## 2016-10-01 DIAGNOSIS — Z7984 Long term (current) use of oral hypoglycemic drugs: Secondary | ICD-10-CM

## 2016-10-01 DIAGNOSIS — G4700 Insomnia, unspecified: Secondary | ICD-10-CM | POA: Diagnosis present

## 2016-10-01 DIAGNOSIS — Z7902 Long term (current) use of antithrombotics/antiplatelets: Secondary | ICD-10-CM | POA: Diagnosis not present

## 2016-10-01 DIAGNOSIS — K224 Dyskinesia of esophagus: Secondary | ICD-10-CM | POA: Diagnosis present

## 2016-10-01 DIAGNOSIS — Z823 Family history of stroke: Secondary | ICD-10-CM

## 2016-10-01 DIAGNOSIS — R112 Nausea with vomiting, unspecified: Principal | ICD-10-CM | POA: Diagnosis present

## 2016-10-01 DIAGNOSIS — E1143 Type 2 diabetes mellitus with diabetic autonomic (poly)neuropathy: Secondary | ICD-10-CM | POA: Diagnosis present

## 2016-10-01 DIAGNOSIS — Z7982 Long term (current) use of aspirin: Secondary | ICD-10-CM

## 2016-10-01 DIAGNOSIS — K439 Ventral hernia without obstruction or gangrene: Secondary | ICD-10-CM | POA: Diagnosis present

## 2016-10-01 DIAGNOSIS — R42 Dizziness and giddiness: Secondary | ICD-10-CM | POA: Diagnosis present

## 2016-10-01 DIAGNOSIS — E872 Acidosis: Secondary | ICD-10-CM | POA: Diagnosis not present

## 2016-10-01 DIAGNOSIS — Z8679 Personal history of other diseases of the circulatory system: Secondary | ICD-10-CM | POA: Diagnosis not present

## 2016-10-01 DIAGNOSIS — Z833 Family history of diabetes mellitus: Secondary | ICD-10-CM

## 2016-10-01 DIAGNOSIS — Z8673 Personal history of transient ischemic attack (TIA), and cerebral infarction without residual deficits: Secondary | ICD-10-CM | POA: Diagnosis not present

## 2016-10-01 DIAGNOSIS — N179 Acute kidney failure, unspecified: Secondary | ICD-10-CM | POA: Diagnosis not present

## 2016-10-01 DIAGNOSIS — F411 Generalized anxiety disorder: Secondary | ICD-10-CM | POA: Diagnosis present

## 2016-10-01 DIAGNOSIS — R109 Unspecified abdominal pain: Secondary | ICD-10-CM

## 2016-10-01 DIAGNOSIS — Z6841 Body Mass Index (BMI) 40.0 and over, adult: Secondary | ICD-10-CM | POA: Diagnosis not present

## 2016-10-01 DIAGNOSIS — E0590 Thyrotoxicosis, unspecified without thyrotoxic crisis or storm: Secondary | ICD-10-CM | POA: Diagnosis present

## 2016-10-01 DIAGNOSIS — I10 Essential (primary) hypertension: Secondary | ICD-10-CM | POA: Diagnosis present

## 2016-10-01 DIAGNOSIS — R1013 Epigastric pain: Secondary | ICD-10-CM

## 2016-10-01 DIAGNOSIS — E876 Hypokalemia: Secondary | ICD-10-CM | POA: Diagnosis present

## 2016-10-01 DIAGNOSIS — K3184 Gastroparesis: Secondary | ICD-10-CM | POA: Diagnosis present

## 2016-10-01 DIAGNOSIS — E785 Hyperlipidemia, unspecified: Secondary | ICD-10-CM | POA: Diagnosis present

## 2016-10-01 DIAGNOSIS — F332 Major depressive disorder, recurrent severe without psychotic features: Secondary | ICD-10-CM | POA: Diagnosis present

## 2016-10-01 DIAGNOSIS — R9431 Abnormal electrocardiogram [ECG] [EKG]: Secondary | ICD-10-CM | POA: Diagnosis not present

## 2016-10-01 DIAGNOSIS — F482 Pseudobulbar affect: Secondary | ICD-10-CM | POA: Diagnosis present

## 2016-10-01 DIAGNOSIS — E669 Obesity, unspecified: Secondary | ICD-10-CM | POA: Diagnosis present

## 2016-10-01 LAB — CBC WITH DIFFERENTIAL/PLATELET
Basophils Absolute: 0.1 10*3/uL (ref 0.0–0.1)
Basophils Relative: 1 %
EOS ABS: 0.5 10*3/uL (ref 0.0–0.7)
Eosinophils Relative: 3 %
HEMATOCRIT: 44.5 % (ref 39.0–52.0)
HEMOGLOBIN: 14.9 g/dL (ref 13.0–17.0)
LYMPHS ABS: 1.5 10*3/uL (ref 0.7–4.0)
Lymphocytes Relative: 10 %
MCH: 28.5 pg (ref 26.0–34.0)
MCHC: 33.5 g/dL (ref 30.0–36.0)
MCV: 85.1 fL (ref 78.0–100.0)
MONOS PCT: 7 %
Monocytes Absolute: 1 10*3/uL (ref 0.1–1.0)
NEUTROS PCT: 79 %
Neutro Abs: 11.2 10*3/uL — ABNORMAL HIGH (ref 1.7–7.7)
PLATELETS: 291 10*3/uL (ref 150–400)
RBC: 5.23 MIL/uL (ref 4.22–5.81)
RDW: 13.9 % (ref 11.5–15.5)
WBC: 14.3 10*3/uL — ABNORMAL HIGH (ref 4.0–10.5)

## 2016-10-01 LAB — I-STAT CHEM 8, ED
BUN: 18 mg/dL (ref 6–20)
CALCIUM ION: 0.99 mmol/L — AB (ref 1.15–1.40)
CREATININE: 1.2 mg/dL (ref 0.61–1.24)
Chloride: 95 mmol/L — ABNORMAL LOW (ref 101–111)
Glucose, Bld: 168 mg/dL — ABNORMAL HIGH (ref 65–99)
HCT: 46 % (ref 39.0–52.0)
HEMOGLOBIN: 15.6 g/dL (ref 13.0–17.0)
Potassium: 2.7 mmol/L — CL (ref 3.5–5.1)
Sodium: 140 mmol/L (ref 135–145)
TCO2: 31 mmol/L (ref 0–100)

## 2016-10-01 LAB — URINALYSIS, ROUTINE W REFLEX MICROSCOPIC
BILIRUBIN URINE: NEGATIVE
Bacteria, UA: NONE SEEN
GLUCOSE, UA: 50 mg/dL — AB
Hgb urine dipstick: NEGATIVE
KETONES UR: 5 mg/dL — AB
LEUKOCYTES UA: NEGATIVE
Nitrite: NEGATIVE
PROTEIN: 30 mg/dL — AB
Specific Gravity, Urine: 1.021 (ref 1.005–1.030)
WBC UA: NONE SEEN WBC/hpf (ref 0–5)
pH: 6 (ref 5.0–8.0)

## 2016-10-01 LAB — COMPREHENSIVE METABOLIC PANEL
ALBUMIN: 3.7 g/dL (ref 3.5–5.0)
ALT: 52 U/L (ref 17–63)
AST: 45 U/L — AB (ref 15–41)
Alkaline Phosphatase: 101 U/L (ref 38–126)
Anion gap: 14 (ref 5–15)
BILIRUBIN TOTAL: 1.4 mg/dL — AB (ref 0.3–1.2)
BUN: 13 mg/dL (ref 6–20)
CHLORIDE: 97 mmol/L — AB (ref 101–111)
CO2: 27 mmol/L (ref 22–32)
CREATININE: 1.23 mg/dL (ref 0.61–1.24)
Calcium: 8.7 mg/dL — ABNORMAL LOW (ref 8.9–10.3)
GFR calc Af Amer: >60 mL/min (ref >60)
GLUCOSE: 166 mg/dL — AB (ref 65–99)
POTASSIUM: 2.7 mmol/L — AB (ref 3.5–5.1)
Sodium: 138 mmol/L (ref 135–145)
Total Protein: 7.6 g/dL (ref 6.5–8.1)

## 2016-10-01 LAB — ACETAMINOPHEN LEVEL

## 2016-10-01 LAB — MAGNESIUM: Magnesium: 1.9 mg/dL (ref 1.7–2.4)

## 2016-10-01 LAB — ETHANOL: Alcohol, Ethyl (B): <5 mg/dL (ref <5)

## 2016-10-01 LAB — TSH: TSH: 0.374 u[IU]/mL (ref 0.350–4.500)

## 2016-10-01 LAB — I-STAT TROPONIN, ED: Troponin i, poc: 0 ng/mL (ref 0.00–0.08)

## 2016-10-01 LAB — LIPASE, BLOOD: LIPASE: 29 U/L (ref 11–51)

## 2016-10-01 MED ORDER — ENOXAPARIN SODIUM 40 MG/0.4ML ~~LOC~~ SOLN: 40.0000 mg | SUBCUTANEOUS | Status: DC

## 2016-10-01 MED ORDER — DEXTROSE-NACL 5-0.45 % IV SOLN
INTRAVENOUS | Status: DC
  Administered 2016-10-01 – 2016-10-04 (×7): via INTRAVENOUS

## 2016-10-01 MED ORDER — GABAPENTIN 300 MG PO CAPS
300.0000 mg | ORAL_CAPSULE | Freq: Every day | ORAL | Status: DC
  Administered 2016-10-01 – 2016-10-04 (×4): 300 mg via ORAL
  Filled 2016-10-01 (×4): qty 1

## 2016-10-01 MED ORDER — ENOXAPARIN SODIUM 40 MG/0.4ML ~~LOC~~ SOLN
40.0000 mg | SUBCUTANEOUS | Status: DC
  Administered 2016-10-01 – 2016-10-03 (×3): 40 mg via SUBCUTANEOUS
  Filled 2016-10-01 (×4): qty 0.4

## 2016-10-01 MED ORDER — METOPROLOL TARTRATE 50 MG PO TABS
50.0000 mg | ORAL_TABLET | Freq: Two times a day (BID) | ORAL | Status: DC
  Administered 2016-10-02 – 2016-10-05 (×7): 50 mg via ORAL
  Filled 2016-10-01 (×7): qty 1

## 2016-10-01 MED ORDER — POTASSIUM CHLORIDE 10 MEQ/100ML IV SOLN
10.0000 meq | INTRAVENOUS | Status: AC
  Administered 2016-10-01 – 2016-10-02 (×2): 10 meq via INTRAVENOUS
  Filled 2016-10-01 (×2): qty 100

## 2016-10-01 MED ORDER — ATORVASTATIN CALCIUM 40 MG PO TABS
40.0000 mg | ORAL_TABLET | Freq: Every day | ORAL | Status: DC
  Administered 2016-10-02 – 2016-10-04 (×3): 40 mg via ORAL
  Filled 2016-10-01 (×4): qty 1

## 2016-10-01 MED ORDER — SODIUM CHLORIDE 0.9 % IV BOLUS (SEPSIS)
2000.0000 mL | Freq: Once | INTRAVENOUS | Status: AC
  Administered 2016-10-01: 2000 mL via INTRAVENOUS

## 2016-10-01 MED ORDER — IOPAMIDOL (ISOVUE-300) INJECTION 61%
INTRAVENOUS | Status: AC
  Filled 2016-10-01: qty 100

## 2016-10-01 MED ORDER — CLOPIDOGREL BISULFATE 75 MG PO TABS
75.0000 mg | ORAL_TABLET | Freq: Every day | ORAL | Status: DC
  Administered 2016-10-01 – 2016-10-02 (×2): 75 mg via ORAL
  Filled 2016-10-01 (×2): qty 1

## 2016-10-01 MED ORDER — POTASSIUM CHLORIDE 10 MEQ/100ML IV SOLN
10.0000 meq | INTRAVENOUS | Status: AC
  Administered 2016-10-01 (×2): 10 meq via INTRAVENOUS
  Filled 2016-10-01 (×4): qty 100

## 2016-10-01 MED ORDER — ONDANSETRON HCL 4 MG/2ML IJ SOLN
4.0000 mg | Freq: Once | INTRAMUSCULAR | Status: AC
  Administered 2016-10-01: 4 mg via INTRAVENOUS
  Filled 2016-10-01: qty 2

## 2016-10-01 MED ORDER — POTASSIUM CHLORIDE CRYS ER 20 MEQ PO TBCR
60.0000 meq | EXTENDED_RELEASE_TABLET | Freq: Once | ORAL | Status: AC
  Administered 2016-10-01: 60 meq via ORAL
  Filled 2016-10-01: qty 3

## 2016-10-01 MED ORDER — FLUOXETINE HCL 20 MG PO CAPS
40.0000 mg | ORAL_CAPSULE | Freq: Every day | ORAL | Status: DC
  Administered 2016-10-01 – 2016-10-05 (×5): 40 mg via ORAL
  Filled 2016-10-01 (×5): qty 2

## 2016-10-01 MED ORDER — INSULIN ASPART 100 UNIT/ML ~~LOC~~ SOLN
0.0000 [IU] | Freq: Three times a day (TID) | SUBCUTANEOUS | Status: DC
  Administered 2016-10-02 – 2016-10-05 (×2): 1 [IU] via SUBCUTANEOUS

## 2016-10-01 MED ORDER — AMLODIPINE BESYLATE 10 MG PO TABS
10.0000 mg | ORAL_TABLET | Freq: Every day | ORAL | Status: DC
  Administered 2016-10-02 – 2016-10-05 (×4): 10 mg via ORAL
  Filled 2016-10-01 (×5): qty 1

## 2016-10-01 MED ORDER — METOCLOPRAMIDE HCL 5 MG/ML IJ SOLN
5.0000 mg | Freq: Four times a day (QID) | INTRAMUSCULAR | Status: DC | PRN
  Administered 2016-10-01 – 2016-10-03 (×2): 5 mg via INTRAVENOUS
  Filled 2016-10-01 (×2): qty 2

## 2016-10-01 MED ORDER — ASPIRIN EC 81 MG PO TBEC
81.0000 mg | DELAYED_RELEASE_TABLET | Freq: Every day | ORAL | Status: DC
  Administered 2016-10-02 – 2016-10-05 (×4): 81 mg via ORAL
  Filled 2016-10-01 (×5): qty 1

## 2016-10-01 NOTE — ED Triage Notes (Signed)
Patient arrived by [facility] for 3 days of generalized abdominal cramping with nausea,vomiting. Denies abdominal pain. States that he cannot keep anything down. Alert and oriented, NAD

## 2016-10-01 NOTE — H&P (Signed)
[facility] Admission History and Physical Service Pager: [phone]  Patient name: [name] Medical record number: [id number] Date of birth: 10/22/1955 Age: 61 y.o. Gender: male  Primary Care Provider: Berkley Harvey, NP Consultants: None  Code Status: Full    Chief Complaint: Intractable Vomiting   Assessment and Plan: [name] is a 61 y.o. male presenting with intractable vomiting . PMH is significant for T2DM, CVA, history of severe depression, history of middle cerebral aneurysm, HTN, history of migraine headaches, HLD,  Intractable Nausea/Vomiting: Patient presenting with 2 weeks of nausea and vomiting, vitals significant for tachycardia to 104 on admission. Potassium of 2.7. WBC of 14.3. Lipase 29. AST/ALT wnl.  Indicates a 10 pound weight loss over the last week, however per review of chart weight 264 pounds in April and currently 264 lbs; therefore no significant weight loss.  Indicates nausea and vomiting associated with onset of new medications for diabetes and depression. Abdominal examination benign . Indicates history of vertigo, though denies any room spinning associated with nausea or vomiting currently. Differential medications vs. gastroparesis, possibly vertigo . Less likely possibly neurologic symptoms, however patient with neurologic exam and no associated vomiting with nighttime awakens/recent MRI. - Admit to Med-surg observation, attending Dr. Andria Frames  - Metoclopramide 5 mg prn  q6hr,  - Received fluid bolus x 2 in ED - Continue D51/2 NS @125   - TSH/HA1C  - CBC and CMET in the AM - Check tylenol, UDS, ethanol level given hx of severe depression  - Consult to vestibular rehab  - Abdominal xray   Hypokalemia. Threw up [facility] initial given in the ED, will give IV runs  - Replete potassium,IV runs  - recheck potassium tonight  - Magnesium  HTN: currently on chlorthalidone, Norvasc, lisinopril, and lopressor - Restart Norvasc and  lopressor - hold lisinopril and chlorthalidone - restart tomorrow after adequate hydration   T2DM: A1C 8.0, glipizide, possibly Lantus 12 units  per care everywhere  - Continues SSI   Headache: Not severe per patient. 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 Stable mild chronic microvascular ischemic changes and parenchymal volume loss of the brain. - continue Plavix  - continue ASA 81 mg  - continue Lipitor 40mg     Severe depression: Denies any SI, denies any depression currently. Denies ingestion any substances or any drug use. Currently takes prozac (40 mg in AM and 20 mg in the PM) and Zyprexa (recently started)  - Tylenol, ethanol, and UDS - Continue Prozac   - Hold home Zyprexa (recently started)   Sleep Disorder  - Holding Restoril   Prolonged QTc  - Avoid QT prolonging medications  - Repeat EKG in the AM   FEN/GI: Advance diet as tolerated  Prophylaxis: Lovenox   Disposition: Home   History of Present Illness:  [name] is a 61 y.o. male presenting with 3-4 weeks of nausea and vomiting. He states about 4 weeks ago he was diagnosed with diabetes. He was started on medications including metformin and glipizide. He developed diarrhea and vomiting. They stop the metformin about 3 weeks ago; continued the glipizide and an injectable( once weekly) medication which he did not know the name. They then stopped that injectable medication about 1 week ago as patient continued to have symptoms of indigestion and throwing up. Patient indicates having  some gas cramps on and off but denies any significant abdominal pain associated with this nausea and vomiting. He indicates he has not had a bowel movement in several days. He denies any fevers, chills. Indicates having a weight loss of about 10 pounds in 1 week. He denies having a slight  headache. He states he has a history of vertigo however he does not feel like this is similar to his vertigo episodes. Denies any neurologic symptoms. Denies any chest pain or shortness of breath.   Review Of Systems: Per HPI with the following additions:   Review of Systems  Constitutional: Negative for chills and fever.  HENT: Negative for hearing loss and tinnitus.   Eyes: Negative for blurred vision and double vision.  Respiratory: Negative for cough and shortness of breath.   Cardiovascular: Negative for chest pain and palpitations.  Gastrointestinal: Positive for constipation, nausea and vomiting. Negative for abdominal pain, blood in stool, diarrhea and melena.  Genitourinary: Negative for dysuria, frequency and urgency.  Skin: Negative for rash.  Neurological: Positive for headaches.  Psychiatric/Behavioral: Negative for depression and suicidal ideas.    Patient Active Problem List   Diagnosis Date Noted  . 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 Severe episode of recurrent major depressive disorder, without psychotic features ([place]) 07/20/2016  . CVA (cerebral vascular accident) ([place]) 06/15/2016  . Pain of upper abdomen   . Numbness   . Carpal tunnel syndrome, bilateral 12/16/2015  . Status post stroke 11/12/2015  . Transient neurologic deficit 10/15/2015  . Cerebrovascular accident, late effects 10/07/2015  . Complicated migraine   . Anxiety state   . 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 Cerebral infarction due to embolism of right middle cerebral artery ([place]) 09/23/2015  . Cerebral infarction due to embolism of cerebral artery ([place]) 09/23/2015  . Gait disturbance, post-stroke   . Drooping of mouth   . Cerebrovascular accident (CVA) due to embolism of right anterior cerebral artery ([place])   . Benign essential HTN   . Migraine with aura and without status migrainosus, not intractable   . Tachypnea   . Prediabetes   . Acute blood loss anemia   . Brain aneurysm 09/20/2015   . Type 2 diabetes mellitus ([place]) 09/15/2015  . Leukocytosis 09/15/2015  . Aneurysm, cerebral, nonruptured 09/15/2015  . TIA (transient ischemic attack) 09/14/2015  . Temporary cerebral vascular dysfunction 09/14/2015  . H/O transient cerebral ischemia 07/29/2015  . At risk for falling 07/29/2015  . Bilateral hearing loss 10/25/2014  . Abnormal fear 08/08/2012  . Headache, migraine 08/08/2012  . Arthritis, degenerative 08/08/2012  . History of tear of ACL (anterior cruciate ligament) 03/08/2011  . History of knee problem 03/08/2011  . Depression 09/07/2010  . Depressive disorder, not elsewhere classified 09/07/2010  . URI (upper respiratory infection) 08/24/2010  . Hearing loss 09/06/2006  . Hyperlipidemia 04/23/2006  . Essential hypertension 04/23/2006  . Psychophysiological insomnia 04/23/2006    Past Medical History: Past Medical History:  Diagnosis Date  . Allergy   . Anxiety   . Cataract   . Cerebral hemorrhage ([place])   . Chest pain, atypical    12/2003:  negative cardiolyte  . Depression   . Drug abuse   . Eczema   . Erectile dysfunction   . History of meniscal tear    bilateral  . HLD (hyperlipidemia)   . HTN (hypertension)   . Hx of tear of ACL (anterior cruciate ligament)    right  . Insomnia   . Olecranon bursitis of  left elbow 10/2009   s/p I&D by Dr Maxie Better, initially assessed by Dr. Nori Riis   . Prediabetes   . Stroke ([place])   . TIA (transient ischemic attack)     Past Surgical History: Past Surgical History:  Procedure Laterality Date  . EP IMPLANTABLE DEVICE N/A 12/30/2015   Procedure: Loop Recorder Insertion;  Surgeon: Thompson Grayer, MD;  Location: [facility];  Service: Cardiovascular;  Laterality: N/A;  . INCISE AND DRAIN ABCESS     L elbow due to cellulitis/bursitis  . INNER EAR SURGERY    . IR GENERIC HISTORICAL  12/28/2015   IR ANGIO VERTEBRAL SEL VERTEBRAL UNI L MOD SED 12/28/2015 Luanne Bras, MD [facility]  . IR GENERIC HISTORICAL   12/28/2015   IR ANGIO VERTEBRAL SEL SUBCLAVIAN INNOMINATE UNI R MOD SED 12/28/2015 Luanne Bras, MD [facility]  . IR GENERIC HISTORICAL  12/28/2015   IR ANGIO INTRA EXTRACRAN SEL INTERNAL CAROTID BILAT MOD SED 12/28/2015 Luanne Bras, MD [facility]  . KNEE ARTHROSCOPY    . RADIOLOGY WITH ANESTHESIA N/A 09/20/2015   Procedure: EMBOLIZATION         (RADIOLOGY WITH ANESTHESIA);  Surgeon: Luanne Bras, MD;  Location: [facility];  Service: Radiology;  Laterality: N/A;  . SKIN TAG REMOVAL     11 removed  . TEE WITHOUT CARDIOVERSION N/A 12/30/2015   Procedure: TRANSESOPHAGEAL ECHOCARDIOGRAM (TEE);  Surgeon: Larey Dresser, MD;  Location: [facility];  Service: Cardiovascular;  Laterality: N/A;    Social History: Social History  Substance Use Topics  . Smoking status: Never Smoker  . Smokeless tobacco: Never Used  . Alcohol use No   Please also refer to relevant sections of EMR.  Family History: Family History  Problem Relation Age of Onset  . Stroke Mother   . Hypertension Mother   . Aneurysm Mother 62    Died of brain aneursym  . Heart failure Father   . Emphysema Father   . Diabetes Mellitus II Sister   . Colon cancer Neg Hx   . Rectal cancer Neg Hx   . Stomach cancer Neg Hx     Allergies and Medications: No Known Allergies No current facility-administered medications on file prior to encounter.    Current Outpatient Prescriptions on File Prior to Encounter  Medication Sig Dispense Refill  . amLODipine (NORVASC) 10 MG tablet Take 1 tablet (10 mg total) by mouth daily. 30 tablet 0  . atorvastatin (LIPITOR) 40 MG tablet Take 1 tablet (40 mg total) by mouth daily at 6 PM. 30 tablet 0  . chlorthalidone (HYGROTON) 25 MG tablet Take 1 tablet (25 mg total) by mouth daily. 30 tablet 0  . clopidogrel (PLAVIX) 75 MG tablet Take 1 tablet (75 mg total) by mouth daily. 30 tablet 0  . FLUoxetine (PROZAC) 20 MG capsule Take 1 capsule (20 mg total) by mouth daily. Take with 40 mg for  a total of 60 mg 90 capsule 1  . FLUoxetine (PROZAC) 40 MG capsule Take 1 capsule (40 mg total) by mouth daily. Take with 20 mg for a total of 60 mg 90 capsule 1  . lisinopril (PRINIVIL,ZESTRIL) 20 MG tablet Take 2 tablets (40 mg total) by mouth daily. 30 tablet 0  . metoprolol (LOPRESSOR) 50 MG tablet Take 1 tablet (50 mg total) by mouth 2 (two) times daily. 60 tablet 0  . OLANZapine (ZYPREXA) 7.5 MG tablet TAKE ONE TABLET BY MOUTH EVERY NIGHT AT BEDTIME 30 tablet 0  Objective: BP (!) 168/91   Pulse (!) 104   Temp 98.6 F (37 C) (Oral)   Resp 20   SpO2 94%  Exam: General: Obese elderly male, no acute distress Eyes: PERRL ENTM: Moist mucosa membranes  Neck: No lymphadenopathy, no meningism  Cardiovascular: RRR, no murmurs, rubs or gallops Respiratory: CTAB, no wheezes, no rhonchi  Gastrointestinal: BS+, very slight epigastric tenderness, no distention  MSK: No lower extremity edema Derm: No ulcers or lesions Neuro: Cn 2-7 intact Strength equal & normal in upper & lower extremities, finger to nose  Psych:Psych:  Cognition and judgment appear intact. Alert, communicative  and cooperative with normal attention span and concentration. No apparent delusions, illusions, hallucinations   Labs and Imaging: CBC BMET   Recent Labs Lab 10/01/16 1330 10/01/16 1405  WBC 14.3*  --   HGB 14.9 15.6  HCT 44.5 46.0  PLT 291  --     Recent Labs Lab 10/01/16 1330 10/01/16 1405  NA 138 140  K 2.7* 2.7*  CL 97* 95*  CO2 27  --   BUN 13 18  CREATININE 1.23 1.20  GLUCOSE 166* 168*  CALCIUM 8.7*  --      I stat troponin 0  Tonette Bihari, MD 10/01/2016, 3:43 PM PGY-2, [place] pager: [phone], text pages welcome

## 2016-10-01 NOTE — ED Notes (Signed)
Attempted report x1. 

## 2016-10-01 NOTE — Progress Notes (Signed)
NURSING PROGRESS NOTE  [name] [id number] Transfer Data: 10/01/2016 7:38 PM Attending Provider: Zenia Resides, MD AXK:[id number], Sherrill Raring, NP Code Status: Full   [name] is a 61 y.o. male patient transferred from ED -No acute distress noted.  -No complaints of shortness of breath.  -No complaints of chest pain.   Cardiac Monitoring: Box # 13 in place. Cardiac monitor yields: SR.  Blood pressure (!) 142/84, pulse 92, temperature 98.6 F (37 C), temperature source Oral, resp. rate 20, SpO2 94 %.   IV Fluids:  IV in place, occlusive dsg intact without redness, IV cath IV located in left ac D51/2NS infusing at 125 hr}.   Allergies:  Patient has no known allergies.  Past Medical History:   has a past medical history of Allergy; Anxiety; Cataract; Cerebral hemorrhage ([place]); Chest pain, atypical; Depression; Drug abuse; Eczema; Erectile dysfunction; History of meniscal tear; HLD (hyperlipidemia); HTN (hypertension); tear of ACL (anterior cruciate ligament); Insomnia; Olecranon bursitis of left elbow (10/2009); Prediabetes; Stroke [facility]); and TIA (transient ischemic attack).  Past Surgical History:   has a past surgical history that includes Skin tag removal; Knee arthroscopy; Incise and drain abcess; Inner ear surgery; Radiology with anesthesia (N/A, 09/20/2015); Cardiac catheterization (N/A, 12/30/2015); TEE without cardioversion (N/A, 12/30/2015); ir generic historical (12/28/2015); ir generic historical (12/28/2015); and ir generic historical (12/28/2015).  Social History:   reports that he has never smoked. He has never used smokeless tobacco. He reports that he does not drink alcohol or use drugs.  Skin: intact  Patient/Family orientated to room. Information packet given to patient/family. Admission inpatient armband information verified with patient/family to include name and date of birth and placed on patient arm. Side rails up x 2, fall assessment and education completed with  patient/family. Patient/family able to verbalize understanding of risk associated with falls and verbalized understanding to call for assistance before getting out of bed. Call light within reach. Patient/family able to voice and demonstrate understanding of unit orientation instructions.    Will continue to evaluate and treat per MD orders.

## 2016-10-01 NOTE — ED Provider Notes (Signed)
[facility] DEPT Provider Note   CSN: [id number] Arrival date & time: 10/01/16  1313     History   Chief Complaint Chief Complaint  Patient presents with  . Emesis    HPI [name] is a 61 y.o. male past medical history of cerebrovascular accident. Newly diagnosed with diabetes. He presents emergency department with chief complaint of nausea, vomiting and diarrhea. The patient states that this has been ongoing for the past 9 days. Patient thinks it is because of the new diabetes medicine. He was started on. He is unsure of the name of the medicine. He states it's "a pill and a shot" in his belly. The patient states that he has been unable to hold down his regular medications secondary to vomiting. He endorses weakness. He denies chest pain or shortness of breath. He does have abdominal cramping. He states that his vomiting is nonbloody, nonbilious. He states he has been unable to really hold down a lot of fluids. He has not been able to eat foods. He complains of severe nausea. He has no previous history of abdominal surgeries. He states that he has not had a bowel movement in the past 4 days which she attributes to having not been able to eat.  HPI  Past Medical History:  Diagnosis Date  . Allergy   . Anxiety   . Cataract   . Cerebral hemorrhage ([place])   . Chest pain, atypical    12/2003:  negative cardiolyte  . Depression   . Drug abuse   . Eczema   . Erectile dysfunction   . History of meniscal tear    bilateral  . HLD (hyperlipidemia)   . HTN (hypertension)   . Hx of tear of ACL (anterior cruciate ligament)    right  . Insomnia   . Olecranon bursitis of left elbow 10/2009   s/p I&D by Dr Maxie Better, initially assessed by Dr. Nori Riis   . Prediabetes   . Stroke ([place])   . TIA (transient ischemic attack)     Patient Active Problem List   Diagnosis Date Noted  . Intractable vomiting with nausea 10/01/2016  . 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 Severe episode of recurrent  major depressive disorder, without psychotic features ([place]) 07/20/2016  . CVA (cerebral vascular accident) ([place]) 06/15/2016  . Pain of upper abdomen   . Numbness   . Carpal tunnel syndrome, bilateral 12/16/2015  . Status post stroke 11/12/2015  . Transient neurologic deficit 10/15/2015  . Cerebrovascular accident, late effects 10/07/2015  . Complicated migraine   . Anxiety state   . 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 Cerebral infarction due to embolism of right middle cerebral artery ([place]) 09/23/2015  . Cerebral infarction due to embolism of cerebral artery ([place]) 09/23/2015  . Gait disturbance, post-stroke   . Drooping of mouth   . Cerebrovascular accident (CVA) due to embolism of right anterior cerebral artery ([place])   . Benign essential HTN   . Migraine with aura and without status migrainosus, not intractable   . Tachypnea   . Prediabetes   . Acute blood loss anemia   . Brain aneurysm 09/20/2015  . Type 2 diabetes mellitus ([place]) 09/15/2015  . Leukocytosis 09/15/2015  . Aneurysm, cerebral, nonruptured 09/15/2015  . TIA (transient ischemic attack) 09/14/2015  . Temporary cerebral vascular dysfunction 09/14/2015  . H/O transient cerebral ischemia 07/29/2015  . At risk for falling 07/29/2015  . Bilateral hearing loss 10/25/2014  . Abnormal fear 08/08/2012  . Headache, migraine 08/08/2012  .  Arthritis, degenerative 08/08/2012  . History of tear of ACL (anterior cruciate ligament) 03/08/2011  . History of knee problem 03/08/2011  . Depression 09/07/2010  . Depressive disorder, not elsewhere classified 09/07/2010  . URI (upper respiratory infection) 08/24/2010  . Hearing loss 09/06/2006  . Hyperlipidemia 04/23/2006  . Essential hypertension 04/23/2006  . Psychophysiological insomnia 04/23/2006    Past Surgical History:  Procedure Laterality Date  . EP IMPLANTABLE DEVICE N/A 12/30/2015   Procedure: Loop Recorder Insertion;  Surgeon: Thompson Grayer, MD;  Location: [facility];  Service: Cardiovascular;  Laterality: N/A;  . INCISE AND DRAIN ABCESS     L elbow due to cellulitis/bursitis  . INNER EAR SURGERY    . IR GENERIC HISTORICAL  12/28/2015   IR ANGIO VERTEBRAL SEL VERTEBRAL UNI L MOD SED 12/28/2015 Luanne Bras, MD [facility]  . IR GENERIC HISTORICAL  12/28/2015   IR ANGIO VERTEBRAL SEL SUBCLAVIAN INNOMINATE UNI R MOD SED 12/28/2015 Luanne Bras, MD [facility]  . IR GENERIC HISTORICAL  12/28/2015   IR ANGIO INTRA EXTRACRAN SEL INTERNAL CAROTID BILAT MOD SED 12/28/2015 Luanne Bras, MD [facility]  . KNEE ARTHROSCOPY    . RADIOLOGY WITH ANESTHESIA N/A 09/20/2015   Procedure: EMBOLIZATION         (RADIOLOGY WITH ANESTHESIA);  Surgeon: Luanne Bras, MD;  Location: [facility];  Service: Radiology;  Laterality: N/A;  . SKIN TAG REMOVAL     11 removed  . TEE WITHOUT CARDIOVERSION N/A 12/30/2015   Procedure: TRANSESOPHAGEAL ECHOCARDIOGRAM (TEE);  Surgeon: Larey Dresser, MD;  Location: [facility];  Service: Cardiovascular;  Laterality: N/A;       Home Medications    Prior to Admission medications   Medication Sig Start Date End Date Taking? Authorizing Provider  amLODipine (NORVASC) 10 MG tablet Take 1 tablet (10 mg total) by mouth daily. 12/31/15  Yes Arrien, Jimmy Picket, MD  aspirin EC 81 MG tablet Take 81 mg by mouth daily.   Yes [provider]  atorvastatin (LIPITOR) 40 MG tablet Take 1 tablet (40 mg total) by mouth daily at 6 PM. 12/31/15  Yes Arrien, Jimmy Picket, MD  chlorthalidone (HYGROTON) 25 MG tablet Take 1 tablet (25 mg total) by mouth daily. 12/31/15  Yes Arrien, Jimmy Picket, MD  clopidogrel (PLAVIX) 75 MG tablet Take 1 tablet (75 mg total) by mouth daily. 09/30/15  Yes Angiulli, Lavon Paganini, PA-C  FLUoxetine (PROZAC) 20 MG capsule Take 1 capsule (20 mg total) by mouth daily. Take with 40 mg for a total of 60 mg 09/06/16  Yes Eksir, Richard Miu, MD  FLUoxetine (PROZAC) 40 MG capsule Take 1 capsule (40 mg total) by  mouth daily. Take with 20 mg for a total of 60 mg 09/06/16 09/06/17 Yes Eksir, Richard Miu, MD  gabapentin (NEURONTIN) 300 MG capsule Take 300 mg by mouth at bedtime.   Yes [provider]  glipiZIDE (GLUCOTROL) 10 MG tablet Take 10 mg by mouth daily before breakfast.   Yes [provider]  HYDROcodone-acetaminophen (NORCO/VICODIN) 5-325 MG tablet Take 1 tablet by mouth 2 (two) times daily.   Yes [provider]  lisinopril (PRINIVIL,ZESTRIL) 20 MG tablet Take 2 tablets (40 mg total) by mouth daily. 12/31/15  Yes Arrien, Jimmy Picket, MD  metoprolol (LOPRESSOR) 50 MG tablet Take 1 tablet (50 mg total) by mouth 2 (two) times daily. 12/31/15  Yes Arrien, Jimmy Picket, MD  OLANZapine (ZYPREXA) 7.5 MG tablet TAKE ONE TABLET BY MOUTH EVERY NIGHT AT  BEDTIME 10/01/16  Yes Eksir, Richard Miu, MD  temazepam (RESTORIL) 15 MG capsule Take 15-30 mg by mouth at bedtime as needed for sleep.   Yes [provider]    Family History Family History  Problem Relation Age of Onset  . Stroke Mother   . Hypertension Mother   . Aneurysm Mother 85    Died of brain aneursym  . Heart failure Father   . Emphysema Father   . Diabetes Mellitus II Sister   . Colon cancer Neg Hx   . Rectal cancer Neg Hx   . Stomach cancer Neg Hx     Social History Social History  Substance Use Topics  . Smoking status: Never Smoker  . Smokeless tobacco: Never Used  . Alcohol use No     Allergies   Patient has no known allergies.   Review of Systems Review of Systems  Ten systems reviewed and are negative for acute change, except as noted in the HPI.   Physical Exam Updated Vital Signs BP (!) 160/96   Pulse 91   Temp 98.6 F (37 C) (Oral)   Resp 20   SpO2 97%   Physical Exam  Constitutional: He is oriented to person, place, and time. He appears well-developed and well-nourished. No distress.  Morbidly obese male in no acute distress. Patient weak and sick.  HENT:  Head:  Normocephalic and atraumatic.  Eyes: Conjunctivae are normal. No scleral icterus.  Neck: Normal range of motion. Neck supple.  Cardiovascular: Normal rate, regular rhythm and normal heart sounds.   Pulmonary/Chest: Effort normal and breath sounds normal. No respiratory distress.  Abdominal: Soft. He exhibits no distension and no mass. There is no tenderness. There is no guarding.  Small ecchymotic region secondary to injection site. No evidence of infection  Musculoskeletal: He exhibits no edema.  Neurological: He is alert and oriented to person, place, and time.  Skin: Skin is warm and dry. He is not diaphoretic.  Psychiatric: His behavior is normal.  Nursing note and vitals reviewed.    ED Treatments / Results  Labs (all labs ordered are listed, but only abnormal results are displayed) Labs Reviewed  CBC WITH DIFFERENTIAL/PLATELET - Abnormal; Notable for the following:       Result Value   WBC 14.3 (*)    Neutro Abs 11.2 (*)    All other components within normal limits  COMPREHENSIVE METABOLIC PANEL - Abnormal; Notable for the following:    Potassium 2.7 (*)    Chloride 97 (*)    Glucose, Bld 166 (*)    Calcium 8.7 (*)    AST 45 (*)    Total Bilirubin 1.4 (*)    All other components within normal limits  I-STAT CHEM 8, ED - Abnormal; Notable for the following:    Potassium 2.7 (*)    Chloride 95 (*)    Glucose, Bld 168 (*)    Calcium, Ion 0.99 (*)    All other components within normal limits  LIPASE, BLOOD  MAGNESIUM  URINALYSIS, ROUTINE W REFLEX MICROSCOPIC  RAPID URINE DRUG SCREEN, HOSP PERFORMED  HIV ANTIBODY (ROUTINE TESTING)  TSH  I-STAT TROPOININ, ED    EKG  EKG Interpretation  Date/Time:  Sunday Oct 01 2016 13:22:48 EDT Ventricular Rate:  107 PR Interval:    QRS Duration: 105 QT Interval:  400 QTC Calculation: 534 R Axis:   74 Text Interpretation:  Sinus tachycardia Borderline repolarization abnormality Prolonged QT interval - new No acute changes  Confirmed  by Varney Biles [phone]) on 10/01/2016 1:45:08 PM Also confirmed by Varney Biles [phone]), editor Drema Pry [phone])  on 10/01/2016 2:06:57 PM       Radiology No results found.  Procedures Procedures (including critical care time)  Medications Ordered in ED Medications  potassium chloride 10 mEq in 100 mL IVPB (not administered)  clopidogrel (PLAVIX) tablet 75 mg (not administered)  enoxaparin (LOVENOX) injection 40 mg (not administered)  dextrose 5 %-0.45 % sodium chloride infusion (not administered)  insulin aspart (novoLOG) injection 0-9 Units (not administered)  ondansetron (ZOFRAN) injection 4 mg (4 mg Intravenous Given 10/01/16 1339)  sodium chloride 0.9 % bolus 2,000 mL (2,000 mLs Intravenous New Bag/Given 10/01/16 1455)  potassium chloride SA (K-DUR,KLOR-CON) CR tablet 60 mEq (60 mEq Oral Given 10/01/16 1455)     Initial Impression / Assessment and Plan / ED Course  I have reviewed the triage vital signs and the nursing notes.  Pertinent labs & imaging results that were available during my care of the patient were reviewed by me and considered in my medical decision making (see chart for details).  Clinical Course as of Oct 02 1634  Sun Oct 01, 2016  1435 Marked hypokalemia  Potassium: (!!) 2.7 [AH]  1435 leukocytosis WBC: (!) 14.3 [AH]  1436 EKG with prolonged QT. We will  ED EKG [AH]  1632 The patient with hypokalemia. Abdomen benign abdominal exam, but then able to hold down foods or fluid at this time. Patient will be admitted by family medicine.  [AH]    Clinical Course User Index [AH] Margarita Mail, PA-C      Final Clinical Impressions(s) / ED Diagnoses   Final diagnoses:  Hypokalemia  QT prolongation  Abdominal pain    New Prescriptions New Prescriptions   No medications on file     Margarita Mail, PA-C 10/01/16 [place], [place], MD 10/01/16 1706

## 2016-10-02 LAB — RAPID URINE DRUG SCREEN, HOSP PERFORMED
AMPHETAMINES: NOT DETECTED
Barbiturates: NOT DETECTED
Benzodiazepines: NOT DETECTED
COCAINE: NOT DETECTED
OPIATES: NOT DETECTED
TETRAHYDROCANNABINOL: NOT DETECTED

## 2016-10-02 LAB — CBC
HEMATOCRIT: 39 % (ref 39.0–52.0)
HEMOGLOBIN: 12.8 g/dL — AB (ref 13.0–17.0)
MCH: 28.3 pg (ref 26.0–34.0)
MCHC: 32.8 g/dL (ref 30.0–36.0)
MCV: 86.3 fL (ref 78.0–100.0)
Platelets: 261 10*3/uL (ref 150–400)
RBC: 4.52 MIL/uL (ref 4.22–5.81)
RDW: 14.2 % (ref 11.5–15.5)
WBC: 12.1 10*3/uL — ABNORMAL HIGH (ref 4.0–10.5)

## 2016-10-02 LAB — COMPREHENSIVE METABOLIC PANEL
ALK PHOS: 76 U/L (ref 38–126)
ALT: 47 U/L (ref 17–63)
ANION GAP: 8 (ref 5–15)
AST: 45 U/L — AB (ref 15–41)
Albumin: 3 g/dL — ABNORMAL LOW (ref 3.5–5.0)
BILIRUBIN TOTAL: 0.8 mg/dL (ref 0.3–1.2)
BUN: 8 mg/dL (ref 6–20)
CALCIUM: 7.4 mg/dL — AB (ref 8.9–10.3)
CO2: 25 mmol/L (ref 22–32)
Chloride: 104 mmol/L (ref 101–111)
Creatinine, Ser: 0.85 mg/dL (ref 0.61–1.24)
GFR calc Af Amer: >60 mL/min (ref >60)
Glucose, Bld: 124 mg/dL — ABNORMAL HIGH (ref 65–99)
POTASSIUM: 2.9 mmol/L — AB (ref 3.5–5.1)
Sodium: 137 mmol/L (ref 135–145)
TOTAL PROTEIN: 5.8 g/dL — AB (ref 6.5–8.1)

## 2016-10-02 LAB — GLUCOSE, CAPILLARY
GLUCOSE-CAPILLARY: 135 mg/dL — AB (ref 65–99)
Glucose-Capillary: 148 mg/dL — ABNORMAL HIGH (ref 65–99)
Glucose-Capillary: 121 mg/dL — ABNORMAL HIGH (ref 65–99)
Glucose-Capillary: 146 mg/dL — ABNORMAL HIGH (ref 65–99)

## 2016-10-02 LAB — HIV ANTIBODY (ROUTINE TESTING W REFLEX): HIV Screen 4th Generation wRfx: NONREACTIVE

## 2016-10-02 MED ORDER — HYDROCODONE-ACETAMINOPHEN 5-325 MG PO TABS
1.0000 | ORAL_TABLET | Freq: Once | ORAL | Status: AC
  Administered 2016-10-02: 1 via ORAL
  Filled 2016-10-02: qty 1

## 2016-10-02 MED ORDER — POTASSIUM CHLORIDE CRYS ER 20 MEQ PO TBCR
40.0000 meq | EXTENDED_RELEASE_TABLET | Freq: Two times a day (BID) | ORAL | Status: AC
  Administered 2016-10-02 (×2): 40 meq via ORAL
  Filled 2016-10-02 (×2): qty 2

## 2016-10-02 MED ORDER — MAGNESIUM SULFATE 2 GM/50ML IV SOLN
2.0000 g | Freq: Once | INTRAVENOUS | Status: AC
  Administered 2016-10-02: 2 g via INTRAVENOUS
  Filled 2016-10-02: qty 50

## 2016-10-02 MED ORDER — GI COCKTAIL ~~LOC~~
30.0000 mL | Freq: Once | ORAL | Status: AC
Start: 2016-10-02 — End: 2016-10-02
  Administered 2016-10-02: 30 mL via ORAL
  Filled 2016-10-02: qty 30

## 2016-10-02 MED ORDER — POTASSIUM CHLORIDE 10 MEQ/100ML IV SOLN
10.0000 meq | INTRAVENOUS | Status: AC
  Administered 2016-10-02 (×3): 10 meq via INTRAVENOUS
  Filled 2016-10-02: qty 100

## 2016-10-02 NOTE — Plan of Care (Signed)
Problem: Safety: Goal: Ability to remain free from injury will improve Outcome: Progressing No incidence of falls during this time. Call bell within reach. Bed in low and locked position. Clean and clear environment maintained. Patient alert and oriented. Patient verbalized understanding of safety instruction. 3/4 siderails in place.   Problem: Bowel/Gastric: Goal: Will not experience complications related to bowel motility Outcome: Progressing Patient complaining of nausea and heartburn still, but was able to keep down a few pills and a popsicle at this time. Patient states he thinks the heartburn is due to a "sore" esophagus from the last week and a half of vomiting.

## 2016-10-02 NOTE — Progress Notes (Signed)
PT Cancellation Note  Patient Details Name: [name] MRN: [id number] DOB: 04-07-1956   Cancelled Treatment:    Reason Eval/Treat Not Completed: Other (comment).  Pt did not want to work with PT today stating that he did not want to, "push the envelope" and make himself feel sick by working with me.  He reports he has had central vestibular symptoms since his stroke last summer and that this is different. He is also preoccupied with a possible procedure that he is having tomorrow.  He is agreeable for PT to check back on him tomorrow after his procedure and did report he got up to the chair for a little while yesterday.    Thanks,    Barbarann Ehlers. [place], [place], DPT [phone]   10/02/2016, 4:51 PM

## 2016-10-02 NOTE — Progress Notes (Signed)
Was able to speak w/ Dr. Scarlette Shorts of [facility], who will be able to see pt tomorrow.    Because his schedule for endoscopy is uncertain, he advised that pt should be allowed to eat rather than being kept NPO, so I have re-ordered a clear liq diet for pt and advised his nurse to let pt know that.  Cleotis Nipper, M.D. Pager [phone] If no answer or after 5 PM call [phone]

## 2016-10-02 NOTE — Progress Notes (Signed)
Pt seen at request of [facility] for recent-onset n/v/food intolerance..  It turns out that pt has had outpt colonoscopy by Dr. Fuller Plan of [facility], so will ask their covering physician to see pt.  In the meantime, since they might not be able to see pt until tomorrow, will make pt NPO after midnight to keep open the option of egd or UGI tomrrow, at discretion of [place] MD.  Cleotis Nipper, M.D. Pager [phone] If no answer or after 5 PM call [phone]

## 2016-10-02 NOTE — Discharge Summary (Signed)
[facility] Discharge Summary  Patient name: [name] Medical record number: [id number] Date of birth: 1955-06-27 Age: 61 y.o. Gender: male Date of Admission: 10/01/2016  Date of Discharge: 10/05/16  Admitting Physician: Zenia Resides, MD  Primary Care Provider: Berkley Harvey, NP Consultants: GI  Indication for Hospitalization: intractable nausea and vomiting  Discharge Diagnoses/Problem List:  HTN History of multiple CVAs T2DM Severe depression Sleep disorder Prolonged QTc  Disposition: home  Discharge Condition: stable  Discharge Exam: see progress note from day of discharge  Brief Hospital Course:  Patient presents with 3-4 weeks of nausea and vomiting, around the time when he was started on metformin and then glipizide. Nausea and vomiting thought to be secondary to medications versus gastroparesis and possibly vertigo. Additionally, patient later endorse a feeling of food getting stuck while he was swallowing. GI was consulted for dysphagia, EGD was performed with benign-appearing Schatzki ring and mild esophagitis. GI recommended a gastric emptying study, which was performed and found to be normal despite slowed emptying in the first 2 phases. GI recommended PPI and outpatient follow-up. Barium swallow was also ordered and found nonspecific esophageal motility disorder. Patient was able to tolerate clear and then solid food for the 2 days prior to discharge. PT was consult to for vestibular rehabilitation exercises, which were helpful to the patient.   Issues for Follow Up:  1. May require additional titration of mood medications - patient continues to be depressed.  2. BP -  consider consolidation of BP meds, including possibly switching norvasc to only lisinopril for renal protection. BP well controlled on only norvasc and metoprolol during this admission.  3. Recommend sleep study given resistant HTN and obesity. 4. Patient wants to see a  general surgeon regarding his ventral hernia. Please arrange. 5. Will defer additional DM meds to PCP - patient's nausea and vomiting may be related to new DM meds. Patient required no sliding scale while admitted, although some of this time was nothing by mouth and his home diet may not be reflective of this. 6. Wrote for additional vestibular rehabilitation after discharge.  Significant Procedures: EGD  Significant Labs and Imaging:   Recent Labs Lab 10/02/16 0706 10/04/16 0759 10/05/16 0715  WBC 12.1* 9.6 11.4*  HGB 12.8* 12.8* 14.4  HCT 39.0 39.3 43.3  PLT 261 237 280    Recent Labs Lab 10/01/16 1330 10/01/16 1405 10/02/16 0706 10/03/16 0609 10/04/16 0759 10/05/16 0715  NA 138 140 137 138 138 137  K 2.7* 2.7* 2.9* 3.9 3.3* 3.9  CL 97* 95* 104 105 106 105  CO2 27  --  25 27 26 23   GLUCOSE 166* 168* 124* 118* 131* 111*  BUN 13 18 8  5* <5* 6  CREATININE 1.23 1.20 0.85 0.77 0.80 0.83  CALCIUM 8.7*  --  7.4* 7.3* 7.4* 8.2*  MG 1.9  --   --  2.2  --   --   PHOS  --   --   --  1.6*  --   --   ALKPHOS 101  --  76  --   --   --   AST 45*  --  45*  --   --   --   ALT 52  --  47  --   --   --   ALBUMIN 3.7  --  3.0* 2.7*  --   --     Nm Gastric Emptying  Result Date: 10/04/2016 CLINICAL DATA:  Nausea  and vomiting for 2 weeks EXAM: NUCLEAR MEDICINE GASTRIC EMPTYING SCAN TECHNIQUE: After oral ingestion of radiolabeled meal, sequential abdominal images were obtained for 4 hours. Percentage of activity emptying the stomach was calculated at 1 hour, 2 hour, 3 hour, and 4 hours. RADIOPHARMACEUTICALS:  2 mCi Tc-22m sulfur colloid in standardized meal COMPARISON:  None. FINDINGS: Expected location of the stomach in the left upper quadrant. Ingested meal empties the stomach gradually over the course of the study. 1% emptied at 1 hr ( normal >= 10%) 28% emptied at 2 hr ( normal >= 40%) 26% emptied at 3 hr ( normal >= 70%) 90% emptied at 4 hr ( normal >= 90%) IMPRESSION: Slow initial  gastric emptying though the patient did demonstrate normal gastric emptying by the conclusion of the exam at 4 hours. Electronically Signed   By: Lavonia Dana M.D.   On: 10/04/2016 15:22   Dg Esophagus  Result Date: 10/03/2016 CLINICAL DATA:  Difficulty swallowing. EXAM: ESOPHOGRAM/BARIUM SWALLOW TECHNIQUE: Single contrast examination was performed using  thin barium. FLUOROSCOPY TIME:  Fluoroscopy Time:  2 minutes and 6 seconds. Radiation Exposure Index (if provided by the fluoroscopic device): 18.5 mGy Number of Acquired Spot Images: 0 COMPARISON:  None. FINDINGS: Study was limited by patient immobility. Patient had EGD earlier today and appeared somewhat lethargic at the time of exam. Oblique views of the esophagus shows no diverticulum, gross ulceration, or mass lesion. Mild Schatzki's ring evident. Patient was placed supine LPO relative to the fluoro table and monitored fluoroscopically while swallowing. This demonstrates disruption of primary peristalsis on multiple swallows with some mild tertiary contractions but no overt presbyesophagus. A 13 mm barium tablet did pass into the stomach when taken with water. IMPRESSION: 1. Nonspecific esophageal motility disorder. At one point during the exam with stasis of barium in the esophagus, the patient reported that this was similar to the symptoms he was having at home. 2. Mild Schatzki ring without obstruction to passage of a 13 mm barium tablet. Electronically Signed   By: Misty Stanley M.D.   On: 10/03/2016 16:03   Dg Abd 2 Views  Result Date: 10/01/2016 CLINICAL DATA:  Vomiting.  Abdominal pain. EXAM: ABDOMEN - 2 VIEW COMPARISON:  None. FINDINGS: The bowel gas pattern is normal. There is no evidence of free air. No radio-opaque calculi or other significant radiographic abnormality is seen. IMPRESSION: Negative. Electronically Signed   By: Dorise Bullion III M.D   On: 10/01/2016 21:33   US Abdomen Limited Ruq  Result Date: 10/04/2016 CLINICAL DATA:   Nausea vomiting for 2 weeks. EXAM: US ABDOMEN LIMITED - RIGHT UPPER QUADRANT COMPARISON:  None. FINDINGS: Gallbladder: No gallstones or wall thickening visualized. No sonographic Murphy sign noted by sonographer. Common bile duct: Diameter: 7.1 mm.  No intraductal calculus is seen. Liver: Heterogeneously increased echogenicity. IMPRESSION: No evidence of cholelithiasis. Borderline increased in size extrahepatic common bile duct measuring 7.1 mm. Diffusely increased heterogeneous echogenicity of the liver, usually associated with intrinsic liver disease. Electronically Signed   By: Fidela Salisbury M.D.   On: 10/04/2016 07:29   EGD -  1. Mild esophagitis either primary from GERD or secondary from vomiting. 2. Incidental large caliber distal esophageal ring 3. Otherwise normal EGD  Results/Tests Pending at Time of Discharge: none  Discharge Medications:  Allergies as of 10/05/2016   No Known Allergies     Medication List    STOP taking these medications   chlorthalidone 25 MG tablet Commonly known as:  HYGROTON  glipiZIDE 10 MG tablet Commonly known as:  GLUCOTROL   HYDROcodone-acetaminophen 5-325 MG tablet Commonly known as:  NORCO/VICODIN   lisinopril 20 MG tablet Commonly known as:  PRINIVIL,ZESTRIL   OLANZapine 7.5 MG tablet Commonly known as:  ZYPREXA     TAKE these medications   amLODipine 10 MG tablet Commonly known as:  NORVASC Take 1 tablet (10 mg total) by mouth daily.   aspirin EC 81 MG tablet Take 81 mg by mouth daily.   atorvastatin 40 MG tablet Commonly known as:  LIPITOR Take 1 tablet (40 mg total) by mouth daily at 6 PM.   clopidogrel 75 MG tablet Commonly known as:  PLAVIX Take 1 tablet (75 mg total) by mouth daily.   FLUoxetine 20 MG capsule Commonly known as:  PROZAC Take 1 capsule (20 mg total) by mouth daily. Take with 40 mg for a total of 60 mg What changed:  Another medication with the same name was removed. Continue taking this medication,  and follow the directions you see here.   gabapentin 300 MG capsule Commonly known as:  NEURONTIN Take 300 mg by mouth at bedtime.   metoprolol 50 MG tablet Commonly known as:  LOPRESSOR Take 1 tablet (50 mg total) by mouth 2 (two) times daily.   pantoprazole 40 MG tablet Commonly known as:  PROTONIX Take 1 tablet (40 mg total) by mouth daily. Start taking on:  10/06/2016   temazepam 15 MG capsule Commonly known as:  RESTORIL Take 15-30 mg by mouth at bedtime as needed for sleep.       Discharge Instructions: Please refer to Patient Instructions section of EMR for full details.  Patient was counseled important signs and symptoms that should prompt return to medical care, changes in medications, dietary instructions, activity restrictions, and follow up appointments.   Follow-Up Appointments: Follow-up Information    Berkley Harvey, NP. Schedule an appointment as soon as possible for a visit in 1 week(s).   Specialty:  Nurse Practitioner Why:  for hospital follow up Contact information: [address] [address] [phone]           Sela Hilding, MD 10/05/2016, 1:31 PM PGY-1, [place]

## 2016-10-02 NOTE — Progress Notes (Signed)
Family Medicine Teaching Service Daily Progress Note Intern Pager: [phone]  Patient name: [name] Medical record number: [id number] Date of birth: 03/15/1956 Age: 61 y.o. Gender: male  Primary Care Provider: Berkley Harvey, NP Consultants: none Code Status: FULL  Pt Overview and Major Events to Date:  5/6 admitted with intractable N/V  Assessment and Plan: [name] is a 61 y.o. male presenting with intractable vomiting . PMH is significant for T2DM, CVA, history of severe depression, history of middle cerebral aneurysm, HTN, history of migraine headaches, HLD.  Intractable Nausea/Vomiting: Patient presenting with 2 weeks of nausea and vomiting, vitals significant for tachycardia to 104 on admission. Differential medications vs. gastroparesis, possibly vertigo. - Metoclopramide 5 mg prn  q6hr - Continue D51/2 NS @125   - TSH/HgbA1C  - CBC and CMET - Consult to vestibular rehab   Hypokalemia. K on admit 2.7 > 2.9 after 3 IV runs.  Mg 1.9.  -replete Mg 2 g - Replete potassium, Kdur 40 meQ x 2 doses, IV runs   HTN: home meds are chlorthalidone, Norvasc, lisinopril, and lopressor.  - Restart Norvasc and lopressor - consider switching norvasc for his home lisinopril for renal protection as BP well controlled on 2 meds  T2DM: A1C 8.0, glipizide, possibly Lantus 12 units per care everywhere  - Continues SSI   Headache: Not severe per patient. 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 Stable mild chronic microvascular ischemic changes and parenchymal volume loss of the brain. - continue Plavix  - continue ASA 81 mg  - continue Lipitor 40mg     Severe depression: Denies any SI, denies any depression currently. Denies ingestion any substances or any drug use. Currently takes prozac (40 mg in AM and 20 mg in the PM) and  Zyprexa (recently started)  - Tylenol, ethanol, and UDS - Continue Prozac   - Hold home Zyprexa (recently started)   Sleep Disorder  - Holding Restoril   Prolonged QTc  - Avoid QT prolonging medications  - Repeat EKG in the AM   FEN/GI: Advance diet as tolerated  Prophylaxis: Lovenox   Disposition: continued inpatient management of hypokalemia  Subjective:  Patient denies vomiting since last night. Denies diarrhea. Reports he had some severe heartburn last night. Wants to try liquid breakfast this am.   Objective: Temp:  [98.2 F (36.8 C)-98.6 F (37 C)] 98.2 F (36.8 C) (05/07 0646) Pulse Rate:  [85-106] 85 (05/07 0646) Resp:  [18-21] 18 (05/07 0646) BP: (126-168)/(64-96) 126/71 (05/07 0646) SpO2:  [93 %-97 %] 93 % (05/07 0646) Weight:  [264 lb 11.2 oz (120.1 kg)] 264 lb 11.2 oz (120.1 kg) (05/06 1900) Physical Exam: General: obese male lying in bed in NAD.  Cardiovascular: RRR, no murmur Respiratory: CTAB, no crackles or wheezes Abdomen: SNTND, +BS Extremities: no edema, warm  Laboratory:  Recent Labs Lab 10/01/16 1330 10/01/16 1405 10/02/16 0706  WBC 14.3*  --  PENDING  HGB 14.9 15.6 12.8*  HCT 44.5 46.0 39.0  PLT 291  --  261    Recent Labs Lab 10/01/16 1330 10/01/16 1405 10/02/16 0706  NA 138 140 137  K 2.7* 2.7* 2.9*  CL 97* 95* 104  CO2 27  --  25  BUN 13 18 8   CREATININE 1.23 1.20 0.85  CALCIUM 8.7*  --  7.4*  PROT 7.6  --  5.8*  BILITOT 1.4*  --  0.8  ALKPHOS 101  --  76  ALT 52  --  47  AST 45*  --  45*  GLUCOSE 166* 168* 124*     Imaging/Diagnostic Tests: Dg Abd 2 Views  Result Date: 10/01/2016 CLINICAL DATA:  Vomiting.  Abdominal pain. EXAM: ABDOMEN - 2 VIEW COMPARISON:  None. FINDINGS: The bowel gas pattern is normal. There is no evidence of free air. No radio-opaque calculi or other significant radiographic abnormality is seen. IMPRESSION: Negative. Electronically Signed   By: Dorise Bullion III M.D   On: 10/01/2016 21:33      Sela Hilding, MD 10/02/2016, 9:48 AM PGY-1, [place] Intern pager: [phone], text pages welcome

## 2016-10-03 ENCOUNTER — Inpatient Hospital Stay (HOSPITAL_COMMUNITY): Payer: Managed Care, Other (non HMO) | Admitting: Anesthesiology

## 2016-10-03 ENCOUNTER — Inpatient Hospital Stay (HOSPITAL_COMMUNITY): Payer: Managed Care, Other (non HMO)

## 2016-10-03 ENCOUNTER — Encounter (HOSPITAL_COMMUNITY)
Admission: EM | Disposition: A | Discharge status: Home Health | Payer: Self-pay | Source: Home / Self Care | Attending: Family Medicine

## 2016-10-03 ENCOUNTER — Encounter (HOSPITAL_COMMUNITY): Payer: Self-pay | Admitting: Physician Assistant

## 2016-10-03 DIAGNOSIS — K2100 Gastro-esophageal reflux disease with esophagitis, without bleeding: Secondary | ICD-10-CM

## 2016-10-03 DIAGNOSIS — K222 Esophageal obstruction: Secondary | ICD-10-CM

## 2016-10-03 DIAGNOSIS — R112 Nausea with vomiting, unspecified: Principal | ICD-10-CM

## 2016-10-03 HISTORY — PX: ESOPHAGOGASTRODUODENOSCOPY: SHX5428

## 2016-10-03 LAB — RENAL FUNCTION PANEL
ALBUMIN: 2.7 g/dL — AB (ref 3.5–5.0)
ANION GAP: 6 (ref 5–15)
BUN: 5 mg/dL — ABNORMAL LOW (ref 6–20)
CALCIUM: 7.3 mg/dL — AB (ref 8.9–10.3)
CO2: 27 mmol/L (ref 22–32)
CREATININE: 0.77 mg/dL (ref 0.61–1.24)
Chloride: 105 mmol/L (ref 101–111)
GFR calc non Af Amer: >60 mL/min (ref >60)
GLUCOSE: 118 mg/dL — AB (ref 65–99)
PHOSPHORUS: 1.6 mg/dL — AB (ref 2.5–4.6)
Potassium: 3.9 mmol/L (ref 3.5–5.1)
SODIUM: 138 mmol/L (ref 135–145)

## 2016-10-03 LAB — GLUCOSE, CAPILLARY
GLUCOSE-CAPILLARY: 113 mg/dL — AB (ref 65–99)
GLUCOSE-CAPILLARY: 92 mg/dL (ref 65–99)
GLUCOSE-CAPILLARY: 106 mg/dL — AB (ref 65–99)
GLUCOSE-CAPILLARY: 148 mg/dL — AB (ref 65–99)
Glucose-Capillary: 115 mg/dL — ABNORMAL HIGH (ref 65–99)

## 2016-10-03 LAB — MAGNESIUM: Magnesium: 2.2 mg/dL (ref 1.7–2.4)

## 2016-10-03 SURGERY — EGD (ESOPHAGOGASTRODUODENOSCOPY)
Anesthesia: Monitor Anesthesia Care

## 2016-10-03 MED ORDER — LACTATED RINGERS IV SOLN
INTRAVENOUS | Status: DC | PRN
  Administered 2016-10-03: 13:00:00 via INTRAVENOUS

## 2016-10-03 MED ORDER — PROPOFOL 500 MG/50ML IV EMUL
INTRAVENOUS | Status: DC | PRN
  Administered 2016-10-03: 100 ug/kg/min via INTRAVENOUS

## 2016-10-03 MED ORDER — K PHOS MONO-SOD PHOS DI & MONO 155-852-130 MG PO TABS
250.0000 mg | ORAL_TABLET | Freq: Every day | ORAL | Status: DC
  Administered 2016-10-03 – 2016-10-05 (×3): 250 mg via ORAL
  Filled 2016-10-03 (×3): qty 1

## 2016-10-03 MED ORDER — MAGNESIUM SULFATE 2 GM/50ML IV SOLN
2.0000 g | Freq: Once | INTRAVENOUS | Status: AC
  Administered 2016-10-03: 2 g via INTRAVENOUS
  Filled 2016-10-03: qty 50

## 2016-10-03 MED ORDER — PANTOPRAZOLE SODIUM 40 MG PO TBEC
40.0000 mg | DELAYED_RELEASE_TABLET | Freq: Every day | ORAL | Status: DC
  Administered 2016-10-03 – 2016-10-05 (×3): 40 mg via ORAL
  Filled 2016-10-03 (×3): qty 1

## 2016-10-03 MED ORDER — PHENOL 1.4 % MT LIQD
1.0000 | OROMUCOSAL | Status: DC | PRN
  Administered 2016-10-03: 1 via OROMUCOSAL
  Filled 2016-10-03: qty 177

## 2016-10-03 NOTE — Anesthesia Preprocedure Evaluation (Signed)
Anesthesia Evaluation  Patient identified by MRN, date of birth, ID band Patient awake    Reviewed: Allergy & Precautions, NPO status , Patient's Chart, lab work & pertinent test results  History of Anesthesia Complications Negative for: history of anesthetic complications  Airway Mallampati: I  TM Distance: >3 FB Neck ROM: Full    Dental  (+) Teeth Intact, Dental Advisory Given   Pulmonary neg pulmonary ROS,   Upper airway expiratory wheeze, lungs clear bilaterally   breath sounds clear to auscultation       Cardiovascular hypertension, Pt. on medications + Peripheral Vascular Disease   Rhythm:Regular Rate:Normal     Neuro/Psych  Headaches, PSYCHIATRIC DISORDERS Anxiety Depression TIA Neuromuscular disease CVA    GI/Hepatic Neg liver ROS, Nausea and vomiting requiring admission. None today   Endo/Other  diabetes, Type 2Morbid obesity  Renal/GU negative Renal ROS     Musculoskeletal  (+) Arthritis ,   Abdominal (+) + obese,   Peds  Hematology  (+) anemia ,   Anesthesia Other Findings   Reproductive/Obstetrics                             Anesthesia Physical Anesthesia Plan  ASA: III  Anesthesia Plan: MAC   Post-op Pain Management:    Induction: Intravenous  Airway Management Planned: Nasal Cannula, Natural Airway and Simple Face Mask  Additional Equipment: None  Intra-op Plan:   Post-operative Plan:   Informed Consent: I have reviewed the patients History and Physical, chart, labs and discussed the procedure including the risks, benefits and alternatives for the proposed anesthesia with the patient or authorized representative who has indicated his/her understanding and acceptance.   Dental advisory given  Plan Discussed with: CRNA and Surgeon  Anesthesia Plan Comments:         Anesthesia Quick Evaluation

## 2016-10-03 NOTE — Progress Notes (Signed)
No order found for patient to be on telemetry. Removed and called central telemetry for notification

## 2016-10-03 NOTE — Consult Note (Addendum)
[facility] Consult: 8:16 AM 10/03/2016  LOS: 2 days    Referring Provider: Dr Andria Frames  Primary Care Physician:  Berkley Harvey, NP Primary Gastroenterologist:  Dr. Fuller Plan    Reason for Consultation:  Nausea and vomiting.   HPI: [name] is a 61 y.o. male.  PMH depression with pseudo bulbar affect.   DM 2 on oral meds, A1c 8 in 05/2016.  Brain aneurysm.  CVA 12/2015.   Migraines.  HTN.  On Plavix.  s/p implantation loop recorder to assess for ? A fib as source of cryptogenic CVA.  05/2014 Colonoscopy, Dr Fuller Plan.  Screening study.  Two 4 - 5 mm polyps (TAs), diverticulosis.     Psychiatrist stopped Lexapro and started Prozac and Zyprexa 07/20/16.   Started on diabetic med Bydureon 4/13 for uncontrolled DM.  After that developed N/V and some loose stools initially so that med stopped and pt started on Glucotrol but sxs of bilious n/v continued, unable to tolerate po.  No CG or bloody N/V.  Normally daily BM pattern stopped and last BM was 6 days ago.  Was never Rxd any GI or nausea meds.  Patient does have a prescription for when necessary use of hydrocodone for periodic dental pain. He is unable to afford the dental care that is needed.. The consumes about 10 tablets over the course of 3 months.       Lipase normal.  AST slightly elevated to 45 but otherwise normal LFTs.  WBCs to 14.3 >> 12.1.  No anemia.   Abdominal films unremarkable.  No PPI etc at home. Takes four 81 mg ASA daily as per MD rec.  No NSAIDS, no ETOH, no tobacco.   Occasional pain in the upper abdominal ventral hernia, was worse when he still worked in [employer].     Past Medical History:  Diagnosis Date  . Allergy   . Anxiety   . Cataract   . Cerebral hemorrhage ([place])   . Chest pain, atypical    12/2003:   negative cardiolyte  . Depression   . Drug abuse   . Eczema   . Erectile dysfunction   . History of meniscal tear    bilateral  . HLD (hyperlipidemia)   . HTN (hypertension)   . Hx of tear of ACL (anterior cruciate ligament)    right  . Insomnia   . Olecranon bursitis of left elbow 10/2009   s/p I&D by Dr Maxie Better, initially assessed by Dr. Nori Riis   . Prediabetes   . Stroke ([place])   . TIA (transient ischemic attack)     Past Surgical History:  Procedure Laterality Date  . EP IMPLANTABLE DEVICE N/A 12/30/2015   Procedure: Loop Recorder Insertion;  Surgeon: Thompson Grayer, MD;  Location: [facility];  Service: Cardiovascular;  Laterality: N/A;  . INCISE AND DRAIN ABCESS     L elbow due to cellulitis/bursitis  . INNER EAR SURGERY    . IR GENERIC HISTORICAL  12/28/2015   IR ANGIO VERTEBRAL SEL VERTEBRAL UNI L MOD  SED 12/28/2015 Luanne Bras, MD [facility]  . IR GENERIC HISTORICAL  12/28/2015   IR ANGIO VERTEBRAL SEL SUBCLAVIAN INNOMINATE UNI R MOD SED 12/28/2015 Luanne Bras, MD [facility]  . IR GENERIC HISTORICAL  12/28/2015   IR ANGIO INTRA EXTRACRAN SEL INTERNAL CAROTID BILAT MOD SED 12/28/2015 Luanne Bras, MD [facility]  . KNEE ARTHROSCOPY    . RADIOLOGY WITH ANESTHESIA N/A 09/20/2015   Procedure: EMBOLIZATION         (RADIOLOGY WITH ANESTHESIA);  Surgeon: Luanne Bras, MD;  Location: [facility];  Service: Radiology;  Laterality: N/A;  . SKIN TAG REMOVAL     11 removed  . TEE WITHOUT CARDIOVERSION N/A 12/30/2015   Procedure: TRANSESOPHAGEAL ECHOCARDIOGRAM (TEE);  Surgeon: Larey Dresser, MD;  Location: [facility];  Service: Cardiovascular;  Laterality: N/A;    Prior to Admission medications   Medication Sig Start Date End Date Taking? Authorizing Provider  amLODipine (NORVASC) 10 MG tablet Take 1 tablet (10 mg total) by mouth daily. 12/31/15  Yes Arrien, Jimmy Picket, MD  aspirin EC 81 MG tablet Take 81 mg by mouth daily.   Yes [provider]    atorvastatin (LIPITOR) 40 MG tablet Take 1 tablet (40 mg total) by mouth daily at 6 PM. 12/31/15  Yes Arrien, Jimmy Picket, MD  chlorthalidone (HYGROTON) 25 MG tablet Take 1 tablet (25 mg total) by mouth daily. 12/31/15  Yes Arrien, Jimmy Picket, MD  clopidogrel (PLAVIX) 75 MG tablet Take 1 tablet (75 mg total) by mouth daily. 09/30/15  Yes Angiulli, Lavon Paganini, PA-C  FLUoxetine (PROZAC) 20 MG capsule Take 1 capsule (20 mg total) by mouth daily. Take with 40 mg for a total of 60 mg 09/06/16  Yes Eksir, Richard Miu, MD  FLUoxetine (PROZAC) 40 MG capsule Take 1 capsule (40 mg total) by mouth daily. Take with 20 mg for a total of 60 mg 09/06/16 09/06/17 Yes Eksir, Richard Miu, MD  gabapentin (NEURONTIN) 300 MG capsule Take 300 mg by mouth at bedtime.   Yes [provider]  glipiZIDE (GLUCOTROL) 10 MG tablet Take 10 mg by mouth daily before breakfast.   Yes [provider]  HYDROcodone-acetaminophen (NORCO/VICODIN) 5-325 MG tablet Take 1 tablet by mouth 2 (two) times daily.   Yes [provider]  lisinopril (PRINIVIL,ZESTRIL) 20 MG tablet Take 2 tablets (40 mg total) by mouth daily. 12/31/15  Yes Arrien, Jimmy Picket, MD  metoprolol (LOPRESSOR) 50 MG tablet Take 1 tablet (50 mg total) by mouth 2 (two) times daily. 12/31/15  Yes Arrien, Jimmy Picket, MD  OLANZapine (ZYPREXA) 7.5 MG tablet TAKE ONE TABLET BY MOUTH EVERY NIGHT AT BEDTIME 10/01/16  Yes Eksir, Richard Miu, MD  temazepam (RESTORIL) 15 MG capsule Take 15-30 mg by mouth at bedtime as needed for sleep.   Yes [provider]    Scheduled Meds: . amLODipine  10 mg Oral Daily  . aspirin EC  81 mg Oral Daily  . atorvastatin  40 mg Oral q1800  . clopidogrel  75 mg Oral Daily  . enoxaparin (LOVENOX) injection  40 mg Subcutaneous Q24H  . FLUoxetine  40 mg Oral Daily  . gabapentin  300 mg Oral QHS  . insulin aspart  0-9 Units Subcutaneous TID WC  . metoprolol  50 mg Oral BID   Infusions: . dextrose 5 %  and 0.45% NaCl 125 mL/hr at 10/03/16 0532   PRN Meds: metoCLOPramide (REGLAN) injection, phenol   Allergies as of 10/01/2016  . (No Known  Allergies)    Family History  Problem Relation Age of Onset  . Stroke Mother   . Hypertension Mother   . Aneurysm Mother 72    Died of brain aneursym  . Heart failure Father   . Emphysema Father   . Diabetes Mellitus II Sister   . Colon cancer Neg Hx   . Rectal cancer Neg Hx   . Stomach cancer Neg Hx     Social History   Social History  . Marital status: Divorced    Spouse name: N/A  . Number of children: N/A  . Years of education: N/A   Occupational History  . Not on file.   Social History Main Topics  . Smoking status: Never Smoker  . Smokeless tobacco: Never Used  . Alcohol use No  . Drug use: No  . Sexual activity: No   Other Topics Concern  . Not on file   Social History Narrative   Separated, has 2 daughters, works as a Forensic psychologist, Therapist, occupational   Works at [employer], distribution center. 10-15-15    REVIEW OF SYSTEMS: Constitutional:  Generally weak, fatigued.   ENT:  No nose bleeds Pulm:  Upper airway wheezing just started while inpt.  indetermitnate sleep study at [facility], has appt for repeat sleep study in a few weeks CV:  No palpitations, no LE edema.  GU:  No hematuria.  + frequency.  + weak stream and hesitancy.  GI:  Per HPI Heme:  No unusual bleeding or bruising.    Transfusions:  none Neuro:  No headaches, no peripheral tingling or numbness.  Left sided weakness. Derm:  No itching, no rash or sores.  Endocrine:  No sweats or chills.  No polyuria or dysuria Immunization:  Not queried Travel:  None beyond local counties in last few months.    PHYSICAL EXAM: Vital signs in last 24 hours: Vitals:   10/02/16 2133 10/03/16 0650  BP: 126/83 119/70  Pulse: 80 72  Resp: 18 18  Temp: 98 F (36.7 C) 98 F (36.7 C)   Wt Readings from Last 3 Encounters:  10/03/16 122.7 kg (270 lb 8 oz)    06/15/16 122.6 kg (270 lb 4.5 oz)  03/23/16 117.9 kg (260 lb)    General: anxious, looks worried and unhealthy.  Not acutely ill Head:  No asymmetry Or swelling.  Eyes:  No scleral icterus. No conjunctival pallor. EOMI. Ears:  Hard of hearing.  Nose:  No discharge or congestion. Mouth:  Most of his teeth are intact. Some dental caries. Oral mucosa is moist and clear. Tongue midline. Neck:  No masses, no thyromegaly, no JVD. Lungs:  Lungs with overall reduced breath sounds but clear. There is audible upper airway wheezing/roaring resp sounds Heart: RRR. No MRG. S1, S2 present. Abdomen:  Upper midline ventral hernia, reduces when laying flat. Nontender. Active bowel sounds. No HSM or masses..   Rectal: Deferred   Musc/Skeltl: No gross joint deformities or swelling. Extremities:  No CCE.  Neurologic:  Alert. Oriented times 3. Moves all 4 limbs, strength not tested. No tremor. No gross deficits. Skin:  No telangiectasia, rashes or sores   Psych:  Emotionally labile, anxious.  Intake/Output from previous day: 05/07 0701 - 05/08 0700 In: 240 [P.O.:240] Out: 1575 [Urine:1575] Intake/Output this shift: No intake/output data recorded.  LAB RESULTS:  Recent Labs  10/01/16 1330 10/01/16 1405 10/02/16 0706  WBC 14.3*  --  12.1*  HGB 14.9 15.6 12.8*  HCT 44.5 46.0 39.0  PLT 291  --  261   BMET Lab Results  Component Value Date   NA 138 10/03/2016   NA 137 10/02/2016   NA 140 10/01/2016   K 3.9 10/03/2016   K 2.9 (L) 10/02/2016   K 2.7 (LL) 10/01/2016   CL 105 10/03/2016   CL 104 10/02/2016   CL 95 (L) 10/01/2016   CO2 27 10/03/2016   CO2 25 10/02/2016   CO2 27 10/01/2016   GLUCOSE 118 (H) 10/03/2016   GLUCOSE 124 (H) 10/02/2016   GLUCOSE 168 (H) 10/01/2016   BUN 5 (L) 10/03/2016   BUN 8 10/02/2016   BUN 18 10/01/2016   CREATININE 0.77 10/03/2016   CREATININE 0.85 10/02/2016   CREATININE 1.20 10/01/2016   CALCIUM 7.3 (L) 10/03/2016   CALCIUM 7.4 (L) 10/02/2016    CALCIUM 8.7 (L) 10/01/2016   LFT  Recent Labs  10/01/16 1330 10/02/16 0706 10/03/16 0609  PROT 7.6 5.8*  --   ALBUMIN 3.7 3.0* 2.7*  AST 45* 45*  --   ALT 52 47  --   ALKPHOS 101 76  --   BILITOT 1.4* 0.8  --    PT/INR Lab Results  Component Value Date   INR 0.98 06/15/2016   INR 1.02 12/28/2015   INR 0.99 09/20/2015   Hepatitis Panel No results for input(s): HEPBSAG, HCVAB, HEPAIGM, HEPBIGM in the last 72 hours. C-Diff No components found for: CDIFF Lipase     Component Value Date/Time   LIPASE 29 10/01/2016 1330    Drugs of Abuse     Component Value Date/Time   LABOPIA NONE DETECTED 10/01/2016 1100   COCAINSCRNUR NONE DETECTED 10/01/2016 1100   COCAINSCRNUR NEG 06/07/2006 0039   LABBENZ NONE DETECTED 10/01/2016 1100   LABBENZ NEG 06/07/2006 0039   AMPHETMU NONE DETECTED 10/01/2016 1100   THCU NONE DETECTED 10/01/2016 1100   LABBARB NONE DETECTED 10/01/2016 1100     RADIOLOGY STUDIES: Dg Abd 2 Views  Result Date: 10/01/2016 CLINICAL DATA:  Vomiting.  Abdominal pain. EXAM: ABDOMEN - 2 VIEW COMPARISON:  None. FINDINGS: The bowel gas pattern is normal. There is no evidence of free air. No radio-opaque calculi or other significant radiographic abnormality is seen. IMPRESSION: Negative. Electronically Signed   By: Dorise Bullion III M.D   On: 10/01/2016 21:33   *  IMPRESSION:   *  Nausea and vomiting coinciding with initiation of diabetes medications but sxs have not ceased with changes and cessation of meds  *  Chronic Plavix, last dose 5/7  *  Hx cryptogenic CVA.    *  Hx tubular adenomatous colon polyp 2016  *  Depression, anxiety, pseudobulbar affect.  Psych med changes since 06/2016 include d/c of Lexapro and starting Prozac and xyprexa.     PLAN:     *  EGD.  Question timing of this. With the Plavix on board, he would be able to undergo a diagnostic endoscopy only. For any endoscopic intervention, would need to hold Plavix for 5 days. I did go  ahead and hold his Plavix this morning.  *  Start empiric PPI.    Azucena Freed  10/03/2016, 8:16 AM Pager: [phone]  GI ATTENDING  History, laboratories, x-rays reviewed. Patient personally seen and examined. Agree with comprehensive consultation note as outlined above. Patient with multiple significant medical problems as noted. Admitted with intractable nausea and vomiting. Has a history of intermittent problems with nausea and vomiting though nothing as protracted as this. Is on multiple medications.  Has a psychiatric history as well as the history of brain and migraines. Plan EGD today.The nature of the procedure, as well as the risks, benefits, and alternatives were carefully and thoroughly reviewed with the patient. Ample time for discussion and questions allowed. The patient understood, was satisfied, and agreed to proceed. Agree with empiric treatment for PPI. Symptomatic therapies with antiemetics. If no GI cause for symptoms found and patient does not respond to empiric GI therapy, would recommend primary service investigate for central causes of intractable nausea and vomiting. Thank you  Docia Chuck. Geri Seminole., M.D. [facility] Division of Gastroenterology  NOTE: Recommended Zofran in his endoscopy report. However, prolonged QT interval noted. Thus. Would not use Zofran. Thanks. Continue with other recommendations. Thanks

## 2016-10-03 NOTE — Evaluation (Signed)
Physical Therapy Evaluation Patient Details Name: [name] MRN: [id number] DOB: [birth year] Today's Date: 10/03/2016   History of Present Illness  Allen R Dixonis a 61 y.o.malepresenting with intractable vomiting with hypokalemia. PMH is significant for T2DM, CVA, history of severe depression, history of middle cerebral aneurysm, HTN, history of migraine headaches, HLD, hx of left ear surgeries with malfunction since 61 years old.  Clinical Impression  Pt admitted with above diagnosis. Pt currently with functional limitations due to the deficits listed below (see PT Problem List). Pt with positive left BPPV.  Treated with canalith repositioning maneuver with pt dry heaving at end of treatment.  Pt rested sitting EOB for 10 minutes and then assisted back to bed as he is having a test later.  Will follow up and continue vestibular treatment as pt tolerates.  Will follow acutely.  Pt will benefit from skilled PT to increase their independence and safety with mobility to allow discharge to the venue listed below.      Follow Up Recommendations Supervision/Assistance - 24 hour;Home health PT (Vestibular rehab)    Equipment Recommendations  None recommended by PT    Recommendations for Other Services       Precautions / Restrictions Precautions Precautions: Fall Restrictions Weight Bearing Restrictions: No      Mobility  Bed Mobility Overal bed mobility: Independent             General bed mobility comments: Tested for BPPV with positive tesing for left posterior canal BPPV via canalith repositioning maneuver.  Pt almost vomited after treatment.  Had to sit EOB for 10 min to recover.  Informed pt to not lie flat for next 18 hours.  Pt agrees.  Pt did not want to walk or get OOB due to pending testing this pm.  He is NPO due to testing and has no strength.   Transfers                 General transfer comment: NT   Ambulation/Gait             General Gait Details:  NT     Balance Overall balance assessment: Needs assistance Sitting-balance support: No upper extremity supported;Feet supported Sitting balance-Leahy Scale: Good                                       Pertinent Vitals/Pain Pain Assessment: No/denies pain    Home Living Family/patient expects to be discharged to:: Private residence Living Arrangements: Alone Available Help at Discharge: Family;Available PRN/intermittently (sister drives pt) Type of Home:  (condo) Home Access: Stairs to enter Entrance Stairs-Rails: Left;Right;Can reach both Entrance Stairs-Number of Steps: 3 + 14 Home Layout: One level Home Equipment: None Additional Comments: dtr and sister live out of town; 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  Pt reports he wall walks and furniture walks.       Hand Dominance   Dominant Hand: Right    Extremity/Trunk Assessment   Upper Extremity Assessment Upper Extremity Assessment: Defer to OT evaluation    Lower Extremity Assessment Lower Extremity Assessment: Generalized weakness    Cervical / Trunk Assessment Cervical / Trunk Assessment: Normal  Communication   Communication: HOH (decreased hearing L ear)  Cognition Arousal/Alertness: Awake/alert Behavior During Therapy: Flat affect Overall Cognitive Status: Within Functional Limits for tasks assessed                                                   Assessment/Plan    PT Assessment Patient needs continued PT services  PT Problem List Decreased activity tolerance;Decreased balance;Decreased mobility;Decreased knowledge of use of DME;Decreased safety awareness;Decreased knowledge of precautions       PT Treatment Interventions DME instruction;Gait training;Functional mobility training;Therapeutic activities;Therapeutic  exercise;Stair training;Balance training;Patient/family education    PT Goals (Current goals can be found in the Care Plan section)  Acute Rehab PT Goals Patient Stated Goal: to go home PT Goal Formulation: With patient Time For Goal Achievement: 10/17/16 Potential to Achieve Goals: Good    Frequency Min 3X/week   Barriers to discharge Decreased caregiver support      Co-evaluation               AM-PAC PT "6 Clicks" Daily Activity  Outcome Measure Difficulty turning over in bed (including adjusting bedclothes, sheets and blankets)?: None Difficulty moving from lying on back to sitting on the side of the bed? : None Difficulty sitting down on and standing up from a chair with arms (e.g., wheelchair, bedside commode, etc,.)?: Total Help needed moving to and from a bed to chair (including a wheelchair)?: Total Help needed walking in hospital room?: Total Help needed climbing 3-5 steps with a railing? : Total 6 Click Score: 12    End of Session   Activity Tolerance: Patient limited by fatigue (limited by nausea) Patient left: in bed;with call bell/phone within reach Nurse Communication: Mobility status PT Visit Diagnosis: Dizziness and giddiness (R42)    Time: 9390-3009 PT Time Calculation (min) (ACUTE ONLY): 30 min   Charges:   PT Evaluation $PT Eval Moderate Complexity: 1 Procedure PT Treatments $Canalith Rep Proc: 8-22 mins   PT G Codes:        Reita Shindler,PT Acute Rehabilitation [phone]  Denice Paradise 10/03/2016, 9:54 AM

## 2016-10-03 NOTE — Op Note (Signed)
[facility] Patient Name: [name] Procedure Date : 10/03/2016 MRN: [id number] Attending MD: Docia Chuck. Henrene Pastor , MD Date of Birth: 07-03-1955 CSN: [id number] Age: 61 Admit Type: Inpatient Procedure:                Upper GI endoscopy Indications:              Nausea with vomiting, Persistent vomiting Providers:                Docia Chuck. Henrene Pastor, MD, Cleda Daub, RN, Cletis [place],                            Technician Referring MD:              Medicines:                Monitored Anesthesia Care Complications:            No immediate complications. Estimated Blood Loss:     Estimated blood loss: none. Procedure:                Pre-Anesthesia Assessment:                           - Prior to the procedure, a History and Physical                            was performed, and patient medications and                            allergies were reviewed. The patient's tolerance of                            previous anesthesia was also reviewed. The risks                            and benefits of the procedure and the sedation                            options and risks were discussed with the patient.                            All questions were answered, and informed consent                            was obtained. Prior Anticoagulants: The patient has                            taken no previous anticoagulant or antiplatelet                            agents. ASA Grade Assessment: III - A patient with                            severe systemic disease. After reviewing the risks  and benefits, the patient was deemed in                            satisfactory condition to undergo the procedure.                           After obtaining informed consent, the endoscope was                            passed under direct vision. Throughout the                            procedure, the patient's blood pressure, pulse, and                            oxygen  saturations were monitored continuously. The                            Endoscope was introduced through the mouth, and                            advanced to the second part of duodenum. The upper                            GI endoscopy was accomplished without difficulty.                            The patient tolerated the procedure well. Scope In: Scope Out: Findings:      The Proximal and mid esophagus was normal.      The stomach was normal.      The examined duodenum was normal.      The cardia and gastric fundus were normal on retroflexion.      Non-severe esophagitis was found distally.      One mild benign-appearing, large-caliber ring was found. Impression:               1. Mild esophagitis either primary from GERD or                            secondary from vomiting.                           2. Incidental large caliber distal esophageal ring                           3. Otherwise normal EGD                           4. Etiology of recurrent nausea and vomiting                            uncertain. Moderate Sedation:      none Recommendation:           1. Pantoprazole 40 mg daily  2. Discontinue metoclopramide                           3. Zofran 4 mg IV every 6 hours                           4. Schedule solid-phase gastric emptying scan to                            rule out significant gastroparesis                           5. Resume Plavix                           6. Patient wants to see a general surgeon regarding                            his ventral hernia. Please arrange.                           7. GI hospital service will continue to follow for                            now. Outpatient GI care with Dr. Fuller Plan as needed. Procedure Code(s):        --- Professional ---                           [phone], Esophagogastroduodenoscopy, flexible,                            transoral; diagnostic, including collection of                             specimen(s) by brushing or washing, when performed                            (separate procedure) Diagnosis Code(s):        --- Professional ---                           K21.0, Gastro-esophageal reflux disease with                            esophagitis                           K22.2, Esophageal obstruction                           R11.2, Nausea with vomiting, unspecified                           R11.10, Vomiting, unspecified CPT copyright 2016 [employer]. All rights reserved. The codes documented in this report are preliminary and upon coder review may  be revised to meet current compliance requirements. Docia Chuck. Henrene Pastor, MD 10/03/2016  1:47:33 PM This report has been signed electronically. Number of Addenda: 0

## 2016-10-03 NOTE — Transfer of Care (Signed)
Immediate Anesthesia Transfer of Care Note  Patient: [name]  Procedure(s) Performed: Procedure(s): ESOPHAGOGASTRODUODENOSCOPY (EGD) (N/A)  Patient Location: PACU  Anesthesia Type:MAC  Level of Consciousness: patient cooperative and responds to stimulation  Airway & Oxygen Therapy: Patient Spontanous Breathing and Patient connected to nasal cannula oxygen  Post-op Assessment: Report given to RN and Post -op Vital signs reviewed and stable  Post vital signs: Reviewed and stable  Last Vitals:  Vitals:   10/03/16 1332 10/03/16 1335  BP: (!) 94/42 (!) 104/43  Pulse: 76 74  Resp: 14 (!) 21  Temp: 36.6 C     Last Pain:  Vitals:   10/03/16 1332  TempSrc: Oral  PainSc:          Complications: No apparent anesthesia complications

## 2016-10-04 ENCOUNTER — Inpatient Hospital Stay (HOSPITAL_COMMUNITY): Payer: Managed Care, Other (non HMO)

## 2016-10-04 ENCOUNTER — Encounter (HOSPITAL_COMMUNITY): Payer: Self-pay | Admitting: Internal Medicine

## 2016-10-04 LAB — CBC
HEMATOCRIT: 39.3 % (ref 39.0–52.0)
HEMOGLOBIN: 12.8 g/dL — AB (ref 13.0–17.0)
MCH: 27.9 pg (ref 26.0–34.0)
MCHC: 32.6 g/dL (ref 30.0–36.0)
MCV: 85.8 fL (ref 78.0–100.0)
Platelets: 237 10*3/uL (ref 150–400)
RBC: 4.58 MIL/uL (ref 4.22–5.81)
RDW: 13.7 % (ref 11.5–15.5)
WBC: 9.6 10*3/uL (ref 4.0–10.5)

## 2016-10-04 LAB — BASIC METABOLIC PANEL
ANION GAP: 6 (ref 5–15)
BUN: <5 mg/dL — ABNORMAL LOW (ref 6–20)
CALCIUM: 7.4 mg/dL — AB (ref 8.9–10.3)
CHLORIDE: 106 mmol/L (ref 101–111)
CO2: 26 mmol/L (ref 22–32)
Creatinine, Ser: 0.8 mg/dL (ref 0.61–1.24)
GFR calc Af Amer: >60 mL/min (ref >60)
GFR calc non Af Amer: >60 mL/min (ref >60)
GLUCOSE: 131 mg/dL — AB (ref 65–99)
POTASSIUM: 3.3 mmol/L — AB (ref 3.5–5.1)
Sodium: 138 mmol/L (ref 135–145)

## 2016-10-04 LAB — T4, FREE: Free T4: 1.03 ng/dL (ref 0.61–1.12)

## 2016-10-04 LAB — GLUCOSE, CAPILLARY
GLUCOSE-CAPILLARY: 122 mg/dL — AB (ref 65–99)
GLUCOSE-CAPILLARY: 88 mg/dL (ref 65–99)
Glucose-Capillary: 113 mg/dL — ABNORMAL HIGH (ref 65–99)

## 2016-10-04 MED ORDER — TECHNETIUM TC 99M SULFUR COLLOID
2.0000 | Freq: Once | INTRAVENOUS | Status: AC | PRN
  Administered 2016-10-04: 2 via INTRAVENOUS

## 2016-10-04 MED ORDER — CLOPIDOGREL BISULFATE 75 MG PO TABS
75.0000 mg | ORAL_TABLET | Freq: Every day | ORAL | Status: DC
  Administered 2016-10-04 – 2016-10-05 (×2): 75 mg via ORAL
  Filled 2016-10-04 (×2): qty 1

## 2016-10-04 NOTE — Progress Notes (Signed)
THIS IS A LATE ENTRY NOTE FROM 5/8.  Family Medicine Teaching Service Daily Progress Note Intern Pager: [phone]  Patient name: [name] Medical record number: [id number] Date of birth: 02-Mar-1956 Age: 61 y.o. Gender: male  Primary Care Provider: Berkley Harvey, NP Consultants: none Code Status: FULL  Pt Overview and Major Events to Date:  5/6 admitted with intractable N/V  Assessment and Plan: [name] is a 61 y.o. male presenting with intractable vomiting . PMH is significant for T2DM, CVA, history of severe depression, history of middle cerebral aneurysm, HTN, history of migraine headaches, HLD.  Intractable Nausea/Vomiting: Patient presenting with 2 weeks of nausea and vomiting, vitals significant for tachycardia to 104 on admission. Differential medications vs. gastroparesis, possibly vertigo. - consulted GI, appreciate recs: possible EGD - Continue D51/2 NS @125   - CBC and CMET - Consult to vestibular rehab   Hypokalemia. Resolved. K on admit 2.7 > 2.9 after 3 IV runs.  Mg 1.9.  -continue to monitor  HTN: home meds are chlorthalidone, Norvasc, lisinopril, and lopressor.  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 Stable mild chronic microvascular ischemic changes and parenchymal volume loss of the brain. - hold Plavix for possible EGD - continue ASA 81 mg  - continue Lipitor 40mg     Severe depression: Denies any SI, denies any depression currently. Denies ingestion any substances or any drug use. Currently takes prozac (40 mg in AM and 20 mg in the PM) and Zyprexa (recently started)  - Tylenol, ethanol, and UDS - Continue Prozac   - Hold home Zyprexa  (recently started)   Sleep Disorder  - Holding Restoril   Prolonged QTc  - Avoid QT prolonging medications    FEN/GI: Advance diet as tolerated  Prophylaxis: Lovenox   Disposition: continued inpatient management of N/V  Subjective:  Patient vomiting this morning working with PT for vestibular rehab.   Objective: Temp:  [97.8 F (36.6 C)-100.4 F (38 C)] 97.8 F (36.6 C) (05/09 0500) Pulse Rate:  [70-82] 73 (05/09 0500) Resp:  [14-21] 17 (05/09 0500) BP: (94-138)/(42-79) 100/48 (05/09 0500) SpO2:  [93 %-97 %] 95 % (05/09 0500) Weight:  [263 lb 14.4 oz (119.7 kg)] 263 lb 14.4 oz (119.7 kg) (05/09 0500) Physical Exam: General: obese male lying in bed in NAD.  Cardiovascular: RRR, no murmur Respiratory: CTAB, no crackles or wheezes Abdomen: SNTND, +BS Extremities: no edema, warm  Laboratory:  Recent Labs Lab 10/01/16 1330 10/01/16 1405 10/02/16 0706  WBC 14.3*  --  12.1*  HGB 14.9 15.6 12.8*  HCT 44.5 46.0 39.0  PLT 291  --  261    Recent Labs Lab 10/01/16 1330 10/01/16 1405 10/02/16 0706 10/03/16 0609  NA 138 140 137 138  K 2.7* 2.7* 2.9* 3.9  CL 97* 95* 104 105  CO2 27  --  25 27  BUN 13 18 8  5*  CREATININE 1.23 1.20 0.85 0.77  CALCIUM 8.7*  --  7.4* 7.3*  PROT 7.6  --  5.8*  --   BILITOT 1.4*  --  0.8  --   ALKPHOS 101  --  76  --   ALT 52  --  47  --  AST 45*  --  45*  --   GLUCOSE 166* 168* 124* 118*     Imaging/Diagnostic Tests: Barium swallow ordered.    Sela Hilding, MD 10/03/2016, 4:00 PM PGY-1, [place] Intern pager: [phone], text pages welcome

## 2016-10-04 NOTE — Progress Notes (Signed)
Daily Rounding Note  10/04/2016, 9:19 AM  LOS: 3 days   SUBJECTIVE:   Chief complaint: nausea and vomiting.  Has been NPO > 24 hours.  No further nausea or vomiting.  Wonders when he well be allowed po.   No abd pain.  OBJECTIVE:         Vital signs in last 24 hours:    Temp:  [97.8 F (36.6 C)-100.4 F (38 C)] 97.8 F (36.6 C) (05/09 0500) Pulse Rate:  [70-82] 73 (05/09 0500) Resp:  [14-21] 17 (05/09 0500) BP: (94-138)/(42-79) 100/48 (05/09 0500) SpO2:  [93 %-97 %] 95 % (05/09 0500) Weight:  [119.7 kg (263 lb 14.4 oz)] 119.7 kg (263 lb 14.4 oz) (05/09 0500) Last BM Date: 10/02/16 Filed Weights   10/01/16 1900 10/03/16 0500 10/04/16 0500  Weight: 120.1 kg (264 lb 11.2 oz) 122.7 kg (270 lb 8 oz) 119.7 kg (263 lb 14.4 oz)   General: obese, anxious.  Comfortable.    Heart: RRR Chest: clear bil Abdomen: soft, active BS.  NT.  obese  Extremities:  No CCE Neuro/Psych:  Fully alert, oriented x 3.  No obvious weakness or deficits.  Mild anxiety.    Intake/Output from previous day: 05/08 0701 - 05/09 0700 In: 1423.3 [P.O.:240; I.V.:1133.3; IV Piggyback:50] Out: 2525 [Urine:2525]  Intake/Output this shift: Total I/O In: -  Out: 550 [Urine:550]  Lab Results:  Recent Labs  10/01/16 1330 10/01/16 1405 10/02/16 0706 10/04/16 0759  WBC 14.3*  --  12.1* 9.6  HGB 14.9 15.6 12.8* 12.8*  HCT 44.5 46.0 39.0 39.3  PLT 291  --  261 237   BMET  Recent Labs  10/02/16 0706 10/03/16 0609 10/04/16 0759  NA 137 138 138  K 2.9* 3.9 3.3*  CL 104 105 106  CO2 25 27 26   GLUCOSE 124* 118* 131*  BUN 8 5* <5*  CREATININE 0.85 0.77 0.80  CALCIUM 7.4* 7.3* 7.4*   LFT  Recent Labs  10/01/16 1330 10/02/16 0706 10/03/16 0609  PROT 7.6 5.8*  --   ALBUMIN 3.7 3.0* 2.7*  AST 45* 45*  --   ALT 52 47  --   ALKPHOS 101 76  --   BILITOT 1.4* 0.8  --    PT/INR No results for input(s): LABPROT, INR in the last 72  hours. Hepatitis Panel No results for input(s): HEPBSAG, HCVAB, HEPAIGM, HEPBIGM in the last 72 hours.  Studies/Results: Dg Esophagus  Result Date: 10/03/2016 CLINICAL DATA:  Difficulty swallowing. EXAM: ESOPHOGRAM/BARIUM SWALLOW TECHNIQUE: Single contrast examination was performed using  thin barium. FLUOROSCOPY TIME:  Fluoroscopy Time:  2 minutes and 6 seconds. Radiation Exposure Index (if provided by the fluoroscopic device): 18.5 mGy Number of Acquired Spot Images: 0 COMPARISON:  None. FINDINGS: Study was limited by patient immobility. Patient had EGD earlier today and appeared somewhat lethargic at the time of exam. Oblique views of the esophagus shows no diverticulum, gross ulceration, or mass lesion. Mild Schatzki's ring evident. Patient was placed supine LPO relative to the fluoro table and monitored fluoroscopically while swallowing. This demonstrates disruption of primary peristalsis on multiple swallows with some mild tertiary contractions but no overt presbyesophagus. A 13 mm barium tablet did pass into the stomach when taken with water. IMPRESSION: 1. Nonspecific esophageal motility disorder. At one point during the exam with stasis of barium in the esophagus, the patient reported that this was similar to the symptoms he was having at home. 2. Mild  Schatzki ring without obstruction to passage of a 13 mm barium tablet. Electronically Signed   By: Misty Stanley M.D.   On: 10/03/2016 16:03   US Abdomen Limited Ruq  Result Date: 10/04/2016 CLINICAL DATA:  Nausea vomiting for 2 weeks. EXAM: US ABDOMEN LIMITED - RIGHT UPPER QUADRANT COMPARISON:  None. FINDINGS: Gallbladder: No gallstones or wall thickening visualized. No sonographic Murphy sign noted by sonographer. Common bile duct: Diameter: 7.1 mm.  No intraductal calculus is seen. Liver: Heterogeneously increased echogenicity. IMPRESSION: No evidence of cholelithiasis. Borderline increased in size extrahepatic common bile duct measuring 7.1 mm.  Diffusely increased heterogeneous echogenicity of the liver, usually associated with intrinsic liver disease. Electronically Signed   By: Fidela Salisbury M.D.   On: 10/04/2016 07:29    ASSESMENT:   *  Nausea and vomiting.   5/8 EGD: vomiting related mild esophagitis.  Non-obstructing esophageal ring.  No etiology for n/v.  Note borderline large CBD and abnormal heterogeneous liver on ultrasound.  Minor increase of AST. Minor  Increase T bili has resolved.  Esophagram with non-specific esophageal dysmotility.     PLAN   *  Gastric emptying study. If this is unrevealing, may want to pursue brain imaging to assess for central cause of sxs or HIDA, though ultrasound quite benign.  . Allow clears after GES.    Azucena Freed  10/04/2016, 9:19 AM Pager: [phone]  GI ATTENDING  Interval history and data reviewed. Patient seen and examined. Agree with interval progress note. EGD with minimal findings as noted. On PPI. No significant gastroparesis on emptying scan. Reglan discontinued. Not a candidate for Zofran (prolonged QT). No GI cause for nausea and vomiting identified. This may be medical nausea or possibly central given his history of CNS disease. Recommend evaluating for central causes for nausea and vomiting. Advance diet as tolerated. No further GI workup planned at this time. Outpatient GI follow-up with Dr. Lucio Edward as needed. Will sign off. Thank you.  Docia Chuck. Geri [place]in[place]., M.D. [facility] Division of Gastroenterology

## 2016-10-04 NOTE — Anesthesia Postprocedure Evaluation (Addendum)
Anesthesia Post Note  Patient: [name]  Procedure(s) Performed: Procedure(s) (LRB): ESOPHAGOGASTRODUODENOSCOPY (EGD) (N/A)  Patient location during evaluation: Endoscopy Anesthesia Type: MAC Level of consciousness: awake and alert Pain management: pain level controlled Vital Signs Assessment: post-procedure vital signs reviewed and stable Respiratory status: spontaneous breathing, nonlabored ventilation, respiratory function stable and patient connected to nasal cannula oxygen Cardiovascular status: stable and blood pressure returned to baseline Anesthetic complications: no       Last Vitals:  Vitals:   10/04/16 0500 10/04/16 1514  BP: (!) 100/48 138/67  Pulse: 73 82  Resp: 17 18  Temp: 36.6 C 36.8 C    Last Pain:  Vitals:   10/04/16 0500  TempSrc: Oral  PainSc:                  [name]

## 2016-10-04 NOTE — Progress Notes (Signed)
Family Medicine Teaching Service Daily Progress Note Intern Pager: [phone]  Patient name: [name] Medical record number: [id number] Date of birth: 15-Jan-1956 Age: 61 y.o. Gender: male  Primary Care Provider: Berkley Harvey, NP Consultants: none Code Status: FULL  Pt Overview and Major Events to Date:  5/6 admitted with intractable N/V 5/8 EGD with benign appearing ring,   Assessment and Plan: [name] is a 61 y.o. male presenting with intractable vomiting . PMH is significant for T2DM, CVA, history of severe depression, history of middle cerebral aneurysm, HTN, history of migraine headaches, HLD.  Intractable Nausea/Vomiting: Patient presenting with 2 weeks of nausea and vomiting, vitals significant for tachycardia to 104 on admission. Differential medications, gastroparesis, esophageal motility disorder.  - GI consulted, appreciate recs: gastric emptying study today, continue PPI - RUQ [place]: no cholelithiasis, CBD 7.39mm (borderline increased) - Continue D51/2 NS @125   - CBC and CMET - Consult to vestibular rehab - doing exercises  Hypokalemia. Resolved. K on admit 2.7 > 2.9 after 3 IV runs.  Mg 1.9.  - daily BMP  HTN: home meds are chlorthalidone, Norvasc, lisinopril, and lopressor.  - holding all meds this am due to gastric emptying study - consider switching norvasc for his home lisinopril for renal protection as BP well controlled on 2 meds  Hyperthyroidism: TSH 0.3 on screening lab. 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 Stable mild chronic microvascular ischemic changes and parenchymal volume loss of the brain. - continue Plavix  - continue ASA 81 mg  - continue Lipitor 40mg     Severe depression: Denies any SI, denies any depression currently. Denies ingestion  any substances or any drug use. Currently takes prozac (40 mg in AM and 20 mg in the PM) and Zyprexa (recently started)  - Tylenol, ethanol, and UDS - Continue Prozac   - Hold home Zyprexa (recently started)   Sleep Disorder  - Holding Restoril   Prolonged QTc  - Avoid QT prolonging medications   FEN/GI: Advance diet as tolerated  Prophylaxis: Lovenox   Disposition: continued inpatient management of N/V  Subjective:  Patient denies vomiting since yesterday morning while working with PT vestibular rehab. He would really like to take a shower. He is hungry as he has been NPO for two days.   Objective: Temp:  [97.8 F (36.6 C)-100.4 F (38 C)] 97.8 F (36.6 C) (05/09 0500) Pulse Rate:  [70-82] 73 (05/09 0500) Resp:  [14-21] 17 (05/09 0500) BP: (94-138)/(42-79) 100/48 (05/09 0500) SpO2:  [93 %-97 %] 95 % (05/09 0500) Weight:  [263 lb 14.4 oz (119.7 kg)] 263 lb 14.4 oz (119.7 kg) (05/09 0500) Physical Exam: General: obese male lying in bed in NAD.  Cardiovascular: RRR, no murmur Respiratory: CTAB, no crackles or wheezes Abdomen: SNTND, +BS Extremities: no edema, warm  Laboratory:  Recent Labs Lab 10/01/16 1330 10/01/16 1405 10/02/16 0706  WBC 14.3*  --  12.1*  HGB 14.9 15.6 12.8*  HCT 44.5 46.0 39.0  PLT 291  --  261    Recent Labs Lab 10/01/16 1330 10/01/16 1405 10/02/16 0706 10/03/16 0609  NA 138 140 137 138  K 2.7* 2.7* 2.9* 3.9  CL 97* 95* 104 105  CO2 27  --  25 27  BUN 13 18 8  5*  CREATININE 1.23 1.20 0.85 0.77  CALCIUM 8.7*  --  7.4* 7.3*  PROT 7.6  --  5.8*  --   BILITOT 1.4*  --  0.8  --   ALKPHOS 101  --  76  --   ALT 52  --  47  --   AST 45*  --  45*  --   GLUCOSE 166* 168* 124* 118*     Imaging/Diagnostic Tests: Dg Esophagus  Result Date: 10/03/2016 CLINICAL DATA:  Difficulty swallowing. EXAM: ESOPHOGRAM/BARIUM SWALLOW TECHNIQUE: Single contrast examination was performed using  thin barium. FLUOROSCOPY TIME:  Fluoroscopy Time:  2  minutes and 6 seconds. Radiation Exposure Index (if provided by the fluoroscopic device): 18.5 mGy Number of Acquired Spot Images: 0 COMPARISON:  None. FINDINGS: Study was limited by patient immobility. Patient had EGD earlier today and appeared somewhat lethargic at the time of exam. Oblique views of the esophagus shows no diverticulum, gross ulceration, or mass lesion. Mild Schatzki's ring evident. Patient was placed supine LPO relative to the fluoro table and monitored fluoroscopically while swallowing. This demonstrates disruption of primary peristalsis on multiple swallows with some mild tertiary contractions but no overt presbyesophagus. A 13 mm barium tablet did pass into the stomach when taken with water. IMPRESSION: 1. Nonspecific esophageal motility disorder. At one point during the exam with stasis of barium in the esophagus, the patient reported that this was similar to the symptoms he was having at home. 2. Mild Schatzki ring without obstruction to passage of a 13 mm barium tablet. Electronically Signed   By: Misty Stanley M.D.   On: 10/03/2016 16:03   Dg Abd 2 Views  Result Date: 10/01/2016 CLINICAL DATA:  Vomiting.  Abdominal pain. EXAM: ABDOMEN - 2 VIEW COMPARISON:  None. FINDINGS: The bowel gas pattern is normal. There is no evidence of free air. No radio-opaque calculi or other significant radiographic abnormality is seen. IMPRESSION: Negative. Electronically Signed   By: Dorise Bullion III M.D   On: 10/01/2016 21:33    Sela Hilding, MD 10/04/2016, 7:27 AM PGY-1, [place] Intern pager: [phone], text pages welcome

## 2016-10-04 NOTE — Progress Notes (Signed)
Gave patient clear liquids and he has had egg and toast with gastric empty study and not nausea present. Will advance to a soft carb modified diet. Patient has fluids with dextrose, will update DR know patient has started on diet.

## 2016-10-04 NOTE — Progress Notes (Signed)
PT Cancellation Note  Patient Details Name: [name] MRN: [id number] DOB: 04/16/1956   Cancelled Treatment:    Reason Eval/Treat Not Completed: Patient at procedure or test/unavailable (Pt at gastric emptying test all am.  )Will return as able.  Thanks.    Denice Paradise 10/04/2016, 12:58 PM  Sala Tague,PT Acute Rehabilitation [phone] (pager)

## 2016-10-05 DIAGNOSIS — R1310 Dysphagia, unspecified: Secondary | ICD-10-CM

## 2016-10-05 LAB — BASIC METABOLIC PANEL
Anion gap: 9 (ref 5–15)
BUN: 6 mg/dL (ref 6–20)
CALCIUM: 8.2 mg/dL — AB (ref 8.9–10.3)
CO2: 23 mmol/L (ref 22–32)
CREATININE: 0.83 mg/dL (ref 0.61–1.24)
Chloride: 105 mmol/L (ref 101–111)
GFR calc Af Amer: >60 mL/min (ref >60)
Glucose, Bld: 111 mg/dL — ABNORMAL HIGH (ref 65–99)
POTASSIUM: 3.9 mmol/L (ref 3.5–5.1)
SODIUM: 137 mmol/L (ref 135–145)

## 2016-10-05 LAB — CBC
HCT: 43.3 % (ref 39.0–52.0)
Hemoglobin: 14.4 g/dL (ref 13.0–17.0)
MCH: 28.6 pg (ref 26.0–34.0)
MCHC: 33.3 g/dL (ref 30.0–36.0)
MCV: 85.9 fL (ref 78.0–100.0)
PLATELETS: 280 10*3/uL (ref 150–400)
RBC: 5.04 MIL/uL (ref 4.22–5.81)
RDW: 14 % (ref 11.5–15.5)
WBC: 11.4 10*3/uL — ABNORMAL HIGH (ref 4.0–10.5)

## 2016-10-05 LAB — GLUCOSE, CAPILLARY
GLUCOSE-CAPILLARY: 109 mg/dL — AB (ref 65–99)
GLUCOSE-CAPILLARY: 126 mg/dL — AB (ref 65–99)

## 2016-10-05 LAB — T3, FREE: T3, Free: 3.6 pg/mL (ref 2.0–4.4)

## 2016-10-05 MED ORDER — PANTOPRAZOLE SODIUM 40 MG PO TBEC: 40.0000 mg | DELAYED_RELEASE_TABLET | Freq: Every day | ORAL | 0 refills | Status: DC

## 2016-10-05 NOTE — Progress Notes (Signed)
Family Medicine Teaching Service Daily Progress Note Intern Pager: [phone]  Patient name: [name] Medical record number: [id number] Date of birth: 12/06/1955 Age: 61 y.o. Gender: male  Primary Care Provider: Berkley Harvey, NP Consultants: none Code Status: FULL  Pt Overview and Major Events to Date:  5/6 admitted with intractable N/V 5/8 EGD with benign appearing ring  Assessment and Plan: [name] is a 62 y.o. male presenting with intractable vomiting . PMH is significant for T2DM, CVA, history of severe depression, history of middle cerebral aneurysm, HTN, history of migraine headaches, HLD.  Intractable Nausea/Vomiting, resolved:Tolerating soft diet. Differential medications, gastroparesis, esophageal motility disorder. Normal gastric emptying study.  - GI consulted, appreciate recs: possible central cause of N/V, signed off, outpatient follow up  - RUQ [place]: no cholelithiasis, CBD 7.3mm (borderline increased) - Consult to vestibular rehab - doing exercises  Hypokalemia. Resolved. K on admit 2.7 > 2.9 after 3 IV runs.  Mg 1.9.  - daily BMP  HTN: home meds are chlorthalidone, Norvasc, lisinopril, and lopressor.  - consider switching norvasc for his home lisinopril for renal protection as BP well controlled on 2 meds  T2DM: A1C 8.0, glipizide, possibly Lantus 12 units per care everywhere. No SSI used over the last 24 hrs.  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 Stable mild chronic microvascular ischemic changes and parenchymal volume loss of the brain. - continue Plavix  - continue ASA 81 mg  - continue Lipitor 40mg     Severe depression: Denies any SI, denies any depression currently. Denies ingestion any substances or any drug use. Currently takes prozac (40 mg in AM and 20 mg in the PM) and Zyprexa (recently started)  - Tylenol, ethanol, and UDS -  Continue Prozac   - Hold home Zyprexa (recently started)   Sleep Disorder  - Holding Restoril   Prolonged QTc  - Avoid QT prolonging medications   FEN/GI: Advance diet as tolerated  Prophylaxis: Lovenox   Disposition: discharge today  Subjective:  Patient denies vomiting. Feels groggy this morning.   Objective: Temp:  [97.7 F (36.5 C)-98.3 F (36.8 C)] 97.7 F (36.5 C) (05/10 0521) Pulse Rate:  [67-82] 67 (05/10 0521) Resp:  [16-20] 20 (05/10 0521) BP: (117-138)/(60-78) 129/78 (05/10 0521) SpO2:  [96 %-98 %] 98 % (05/10 0521) Weight:  [264 lb 9.6 oz (120 kg)] 264 lb 9.6 oz (120 kg) (05/10 0500) Physical Exam: General: obese male sitting up in chair eating breakfast in NAD.  Cardiovascular: RRR, no murmur Respiratory: CTAB, no crackles or wheezes Abdomen: SNTND, +BS Extremities: no edema, warm  Laboratory:  Recent Labs Lab 10/02/16 0706 10/04/16 0759 10/05/16 0715  WBC 12.1* 9.6 11.4*  HGB 12.8* 12.8* 14.4  HCT 39.0 39.3 43.3  PLT 261 237 280    Recent Labs Lab 10/01/16 1330  10/02/16 0706 10/03/16 0609 10/04/16 0759 10/05/16 0715  NA 138  < > 137 138 138 137  K 2.7*  < > 2.9* 3.9 3.3* 3.9  CL 97*  < > 104 105 106 105  CO2 27  --  25 27 26 23   BUN 13  < > 8 5* <5* 6  CREATININE 1.23  < > 0.85 0.77 0.80 0.83  CALCIUM 8.7*  --  7.4* 7.3* 7.4* 8.2*  PROT 7.6  --  5.8*  --   --   --   BILITOT  1.4*  --  0.8  --   --   --   ALKPHOS 101  --  76  --   --   --   ALT 52  --  47  --   --   --   AST 45*  --  45*  --   --   --   GLUCOSE 166*  < > 124* 118* 131* 111*  < > = values in this interval not displayed.   Imaging/Diagnostic Tests: Nm Gastric Emptying  Result Date: 10/04/2016 CLINICAL DATA:  Nausea and vomiting for 2 weeks EXAM: NUCLEAR MEDICINE GASTRIC EMPTYING SCAN TECHNIQUE: After oral ingestion of radiolabeled meal, sequential abdominal images were obtained for 4 hours. Percentage of activity emptying the stomach was calculated at 1 hour, 2  hour, 3 hour, and 4 hours. RADIOPHARMACEUTICALS:  2 mCi Tc-43m sulfur colloid in standardized meal COMPARISON:  None. FINDINGS: Expected location of the stomach in the left upper quadrant. Ingested meal empties the stomach gradually over the course of the study. 1% emptied at 1 hr ( normal >= 10%) 28% emptied at 2 hr ( normal >= 40%) 26% emptied at 3 hr ( normal >= 70%) 90% emptied at 4 hr ( normal >= 90%) IMPRESSION: Slow initial gastric emptying though the patient did demonstrate normal gastric emptying by the conclusion of the exam at 4 hours. Electronically Signed   By: Lavonia Dana M.D.   On: 10/04/2016 15:22   Dg Esophagus  Result Date: 10/03/2016 CLINICAL DATA:  Difficulty swallowing. EXAM: ESOPHOGRAM/BARIUM SWALLOW TECHNIQUE: Single contrast examination was performed using  thin barium. FLUOROSCOPY TIME:  Fluoroscopy Time:  2 minutes and 6 seconds. Radiation Exposure Index (if provided by the fluoroscopic device): 18.5 mGy Number of Acquired Spot Images: 0 COMPARISON:  None. FINDINGS: Study was limited by patient immobility. Patient had EGD earlier today and appeared somewhat lethargic at the time of exam. Oblique views of the esophagus shows no diverticulum, gross ulceration, or mass lesion. Mild Schatzki's ring evident. Patient was placed supine LPO relative to the fluoro table and monitored fluoroscopically while swallowing. This demonstrates disruption of primary peristalsis on multiple swallows with some mild tertiary contractions but no overt presbyesophagus. A 13 mm barium tablet did pass into the stomach when taken with water. IMPRESSION: 1. Nonspecific esophageal motility disorder. At one point during the exam with stasis of barium in the esophagus, the patient reported that this was similar to the symptoms he was having at home. 2. Mild Schatzki ring without obstruction to passage of a 13 mm barium tablet. Electronically Signed   By: Misty Stanley M.D.   On: 10/03/2016 16:03   Dg Abd 2  Views  Result Date: 10/01/2016 CLINICAL DATA:  Vomiting.  Abdominal pain. EXAM: ABDOMEN - 2 VIEW COMPARISON:  None. FINDINGS: The bowel gas pattern is normal. There is no evidence of free air. No radio-opaque calculi or other significant radiographic abnormality is seen. IMPRESSION: Negative. Electronically Signed   By: Dorise Bullion III M.D   On: 10/01/2016 21:33   US Abdomen Limited Ruq  Result Date: 10/04/2016 CLINICAL DATA:  Nausea vomiting for 2 weeks. EXAM: US ABDOMEN LIMITED - RIGHT UPPER QUADRANT COMPARISON:  None. FINDINGS: Gallbladder: No gallstones or wall thickening visualized. No sonographic Murphy sign noted by sonographer. Common bile duct: Diameter: 7.1 mm.  No intraductal calculus is seen. Liver: Heterogeneously increased echogenicity. IMPRESSION: No evidence of cholelithiasis. Borderline increased in size extrahepatic common bile duct measuring 7.1 mm. Diffusely increased heterogeneous  echogenicity of the liver, usually associated with intrinsic liver disease. Electronically Signed   By: Fidela Salisbury M.D.   On: 10/04/2016 07:29    Sela Hilding, MD 10/05/2016, 9:25 AM PGY-1, [facility] Intern pager: [phone], text pages welcome

## 2016-10-05 NOTE — Care Management Note (Signed)
Case Management Note  Patient Details  Name: ASUNCION SHIBATA MRN: [id number] Date of Birth: August 20, 1955  Subjective/Objective:                  Presented with intractable vomiting. PMH is significant for T2DM, CVA, history of severe depression, history of middle cerebral aneurysm, HTN, history of migraine headaches, HLD.  PCP: Eldridge Abrahams  Action/Plan: Discharge to home.  Expected Discharge Date:  10/05/16               Expected Discharge Plan:  [place] (Resides alone. Pt has sister, [name]. [name] lives  in  [place].)  In-House Referral:     Discharge planning Services  CM Consult  Post Acute Care Choice:    Choice offered to:  Patient  DME Arranged:    DME Agency:     HH Arranged:  [facility] [place] arranging home health PT Vestibular therapy. Informed CM request has been processed to staffing department.  Status of Service:  Completed, signed off  If discussed at [place] of Stay Meetings, dates discussed:    Additional Comments:  Sharin Mons, RN 10/05/2016, 6:46 PM

## 2016-10-05 NOTE — Progress Notes (Signed)
[name] to be D/C'd Home per MD order.  Discussed with the patient and all questions fully answered.  VSS, Skin clean, dry and intact without evidence of skin break down, no evidence of skin tears noted. IV catheter discontinued intact. Site without signs and symptoms of complications. Dressing and pressure applied.  An After Visit Summary was printed and given to the patient. Patient received prescription.  D/c education completed with patient/family including follow up instructions, medication list, d/c activities limitations if indicated, with other d/c instructions as indicated by MD - patient able to verbalize understanding, all questions fully answered.   Patient instructed to return to ED, call 911, or call MD for any changes in condition.   Patient escorted via [place], and D/C home via private auto.  Luci Bank 10/05/2016 12:57 PM

## 2016-10-08 LAB — CUP PACEART REMOTE DEVICE CHECK
Date Time Interrogation Session: 20180501003832
MDC IDC PG IMPLANT DT: 20170803

## 2016-10-08 NOTE — Progress Notes (Signed)
Carelink summary report received. Battery status OK. Normal device function. No new symptom episodes, tachy episodes, brady, or pause episodes. No new AF episodes. Monthly summary reports and ROV/PRN 

## 2016-10-20 ENCOUNTER — Encounter (HOSPITAL_COMMUNITY): Payer: Self-pay

## 2016-10-20 ENCOUNTER — Emergency Department (HOSPITAL_COMMUNITY): Payer: Managed Care, Other (non HMO)

## 2016-10-20 ENCOUNTER — Inpatient Hospital Stay (HOSPITAL_COMMUNITY)
Admission: EM | Admit: 2016-10-20 | Discharge: 2016-10-24 | DRG: 392 | Disposition: A | Discharge status: Home / Self Care | Payer: Managed Care, Other (non HMO) | Attending: Family Medicine | Admitting: Family Medicine

## 2016-10-20 DIAGNOSIS — I10 Essential (primary) hypertension: Secondary | ICD-10-CM

## 2016-10-20 DIAGNOSIS — E876 Hypokalemia: Secondary | ICD-10-CM | POA: Diagnosis present

## 2016-10-20 DIAGNOSIS — E785 Hyperlipidemia, unspecified: Secondary | ICD-10-CM | POA: Diagnosis present

## 2016-10-20 DIAGNOSIS — Z7982 Long term (current) use of aspirin: Secondary | ICD-10-CM | POA: Diagnosis not present

## 2016-10-20 DIAGNOSIS — E119 Type 2 diabetes mellitus without complications: Secondary | ICD-10-CM | POA: Diagnosis present

## 2016-10-20 DIAGNOSIS — R112 Nausea with vomiting, unspecified: Secondary | ICD-10-CM | POA: Diagnosis present

## 2016-10-20 DIAGNOSIS — F411 Generalized anxiety disorder: Secondary | ICD-10-CM | POA: Diagnosis present

## 2016-10-20 DIAGNOSIS — G43109 Migraine with aura, not intractable, without status migrainosus: Secondary | ICD-10-CM | POA: Diagnosis present

## 2016-10-20 DIAGNOSIS — E860 Dehydration: Secondary | ICD-10-CM | POA: Diagnosis present

## 2016-10-20 DIAGNOSIS — Z79899 Other long term (current) drug therapy: Secondary | ICD-10-CM | POA: Diagnosis not present

## 2016-10-20 DIAGNOSIS — Z8673 Personal history of transient ischemic attack (TIA), and cerebral infarction without residual deficits: Secondary | ICD-10-CM | POA: Diagnosis not present

## 2016-10-20 DIAGNOSIS — E872 Acidosis, unspecified: Secondary | ICD-10-CM

## 2016-10-20 DIAGNOSIS — Z8249 Family history of ischemic heart disease and other diseases of the circulatory system: Secondary | ICD-10-CM | POA: Diagnosis not present

## 2016-10-20 DIAGNOSIS — Z8679 Personal history of other diseases of the circulatory system: Secondary | ICD-10-CM | POA: Diagnosis not present

## 2016-10-20 DIAGNOSIS — D72829 Elevated white blood cell count, unspecified: Secondary | ICD-10-CM

## 2016-10-20 DIAGNOSIS — D649 Anemia, unspecified: Secondary | ICD-10-CM | POA: Diagnosis present

## 2016-10-20 DIAGNOSIS — K4090 Unilateral inguinal hernia, without obstruction or gangrene, not specified as recurrent: Secondary | ICD-10-CM | POA: Diagnosis present

## 2016-10-20 DIAGNOSIS — K210 Gastro-esophageal reflux disease with esophagitis: Secondary | ICD-10-CM | POA: Diagnosis present

## 2016-10-20 DIAGNOSIS — N179 Acute kidney failure, unspecified: Secondary | ICD-10-CM | POA: Diagnosis present

## 2016-10-20 DIAGNOSIS — F332 Major depressive disorder, recurrent severe without psychotic features: Secondary | ICD-10-CM | POA: Diagnosis present

## 2016-10-20 DIAGNOSIS — G479 Sleep disorder, unspecified: Secondary | ICD-10-CM | POA: Diagnosis present

## 2016-10-20 DIAGNOSIS — R197 Diarrhea, unspecified: Secondary | ICD-10-CM | POA: Diagnosis present

## 2016-10-20 DIAGNOSIS — R9431 Abnormal electrocardiogram [ECG] [EKG]: Secondary | ICD-10-CM | POA: Diagnosis not present

## 2016-10-20 DIAGNOSIS — R1110 Vomiting, unspecified: Secondary | ICD-10-CM | POA: Diagnosis present

## 2016-10-20 LAB — URINALYSIS, ROUTINE W REFLEX MICROSCOPIC
BILIRUBIN URINE: NEGATIVE
Glucose, UA: 50 mg/dL — AB
Hgb urine dipstick: NEGATIVE
Ketones, ur: NEGATIVE mg/dL
Leukocytes, UA: NEGATIVE
NITRITE: NEGATIVE
Protein, ur: NEGATIVE mg/dL
SPECIFIC GRAVITY, URINE: 1.041 — AB (ref 1.005–1.030)
pH: 6 (ref 5.0–8.0)

## 2016-10-20 LAB — CBC
HEMATOCRIT: 45.3 % (ref 39.0–52.0)
HEMOGLOBIN: 15.6 g/dL (ref 13.0–17.0)
MCH: 29.3 pg (ref 26.0–34.0)
MCHC: 34.4 g/dL (ref 30.0–36.0)
MCV: 85 fL (ref 78.0–100.0)
Platelets: 398 10*3/uL (ref 150–400)
RBC: 5.33 MIL/uL (ref 4.22–5.81)
RDW: 13.9 % (ref 11.5–15.5)
WBC: 13.6 10*3/uL — ABNORMAL HIGH (ref 4.0–10.5)

## 2016-10-20 LAB — COMPREHENSIVE METABOLIC PANEL
ALBUMIN: 4.2 g/dL (ref 3.5–5.0)
ALT: 37 U/L (ref 17–63)
ANION GAP: 12 (ref 5–15)
AST: 30 U/L (ref 15–41)
Alkaline Phosphatase: 101 U/L (ref 38–126)
BILIRUBIN TOTAL: 1 mg/dL (ref 0.3–1.2)
BUN: 14 mg/dL (ref 6–20)
CHLORIDE: 101 mmol/L (ref 101–111)
CO2: 24 mmol/L (ref 22–32)
Calcium: 9 mg/dL (ref 8.9–10.3)
Creatinine, Ser: 1.12 mg/dL (ref 0.61–1.24)
GFR calc Af Amer: >60 mL/min (ref >60)
GFR calc non Af Amer: >60 mL/min (ref >60)
GLUCOSE: 171 mg/dL — AB (ref 65–99)
POTASSIUM: 3.1 mmol/L — AB (ref 3.5–5.1)
SODIUM: 137 mmol/L (ref 135–145)
TOTAL PROTEIN: 7.8 g/dL (ref 6.5–8.1)

## 2016-10-20 LAB — I-STAT CG4 LACTIC ACID, ED: Lactic Acid, Venous: 2.45 mmol/L (ref 0.5–1.9)

## 2016-10-20 LAB — I-STAT TROPONIN, ED: TROPONIN I, POC: 0.01 ng/mL (ref 0.00–0.08)

## 2016-10-20 LAB — LACTIC ACID, PLASMA
LACTIC ACID, VENOUS: 1.2 mmol/L (ref 0.5–1.9)
Lactic Acid, Venous: 1.5 mmol/L (ref 0.5–1.9)

## 2016-10-20 LAB — LIPASE, BLOOD: LIPASE: 32 U/L (ref 11–51)

## 2016-10-20 LAB — MAGNESIUM: Magnesium: 2 mg/dL (ref 1.7–2.4)

## 2016-10-20 MED ORDER — FLUOXETINE HCL 20 MG PO CAPS
60.0000 mg | ORAL_CAPSULE | Freq: Every day | ORAL | Status: DC
  Administered 2016-10-20 – 2016-10-24 (×5): 60 mg via ORAL
  Filled 2016-10-20 (×5): qty 3

## 2016-10-20 MED ORDER — ACETAMINOPHEN 325 MG PO TABS
650.0000 mg | ORAL_TABLET | Freq: Four times a day (QID) | ORAL | Status: DC | PRN
  Administered 2016-10-20: 650 mg via ORAL
  Filled 2016-10-20: qty 2

## 2016-10-20 MED ORDER — MECLIZINE HCL 25 MG PO TABS
25.0000 mg | ORAL_TABLET | Freq: Three times a day (TID) | ORAL | Status: DC | PRN
  Filled 2016-10-20: qty 1

## 2016-10-20 MED ORDER — PANTOPRAZOLE SODIUM 40 MG PO TBEC
40.0000 mg | DELAYED_RELEASE_TABLET | Freq: Every day | ORAL | Status: DC
Start: 2016-10-20 — End: 2016-10-24
  Administered 2016-10-20 – 2016-10-24 (×5): 40 mg via ORAL
  Filled 2016-10-20 (×5): qty 1

## 2016-10-20 MED ORDER — IOPAMIDOL (ISOVUE-300) INJECTION 61%
INTRAVENOUS | Status: AC
  Administered 2016-10-20: 100 mL via INTRAVENOUS
  Filled 2016-10-20: qty 100

## 2016-10-20 MED ORDER — LORAZEPAM 2 MG/ML IJ SOLN
0.5000 mg | Freq: Once | INTRAMUSCULAR | Status: AC
  Administered 2016-10-20: 0.5 mg via INTRAVENOUS
  Filled 2016-10-20: qty 1

## 2016-10-20 MED ORDER — SODIUM CHLORIDE 0.9 % IV SOLN
INTRAVENOUS | Status: AC
  Administered 2016-10-20: 18:00:00 via INTRAVENOUS

## 2016-10-20 MED ORDER — ENOXAPARIN SODIUM 60 MG/0.6ML ~~LOC~~ SOLN
60.0000 mg | SUBCUTANEOUS | Status: DC
  Administered 2016-10-20 – 2016-10-23 (×4): 60 mg via SUBCUTANEOUS
  Filled 2016-10-20 (×4): qty 0.6

## 2016-10-20 MED ORDER — SODIUM CHLORIDE 0.9 % IV BOLUS (SEPSIS)
1000.0000 mL | Freq: Once | INTRAVENOUS | Status: AC
  Administered 2016-10-20: 1000 mL via INTRAVENOUS

## 2016-10-20 MED ORDER — METOPROLOL TARTRATE 50 MG PO TABS
50.0000 mg | ORAL_TABLET | Freq: Two times a day (BID) | ORAL | Status: DC
  Administered 2016-10-20 – 2016-10-24 (×8): 50 mg via ORAL
  Filled 2016-10-20 (×8): qty 1

## 2016-10-20 MED ORDER — CLOPIDOGREL BISULFATE 75 MG PO TABS
75.0000 mg | ORAL_TABLET | Freq: Every day | ORAL | Status: DC
  Administered 2016-10-20 – 2016-10-24 (×5): 75 mg via ORAL
  Filled 2016-10-20 (×5): qty 1

## 2016-10-20 MED ORDER — ACETAMINOPHEN 650 MG RE SUPP: 650.0000 mg | Freq: Four times a day (QID) | RECTAL | Status: DC | PRN

## 2016-10-20 MED ORDER — POTASSIUM CHLORIDE CRYS ER 20 MEQ PO TBCR
40.0000 meq | EXTENDED_RELEASE_TABLET | Freq: Once | ORAL | Status: AC
  Administered 2016-10-20: 40 meq via ORAL
  Filled 2016-10-20: qty 2

## 2016-10-20 MED ORDER — ASPIRIN EC 81 MG PO TBEC
81.0000 mg | DELAYED_RELEASE_TABLET | Freq: Every day | ORAL | Status: DC
  Administered 2016-10-20 – 2016-10-24 (×5): 81 mg via ORAL
  Filled 2016-10-20 (×5): qty 1

## 2016-10-20 MED ORDER — SODIUM CHLORIDE 0.9 % IV SOLN
Freq: Once | INTRAVENOUS | Status: AC
  Administered 2016-10-20: 16:00:00 via INTRAVENOUS

## 2016-10-20 MED ORDER — LORAZEPAM 2 MG/ML IJ SOLN
1.0000 mg | Freq: Once | INTRAMUSCULAR | Status: AC | PRN
  Administered 2016-10-22: 1 mg via INTRAVENOUS
  Filled 2016-10-20: qty 1

## 2016-10-20 NOTE — H&P (Signed)
[facility] Admission History and Physical Service Pager: [phone]  Patient name: [name] Medical record number: [id number] Date of birth: [birth year] Age: 61 y.o. Gender: male  Primary Care Provider: Berkley Harvey, NP Consultants: none Code Status: FULL  Chief Complaint: intractable nausea and vomiting  Assessment and Plan: MACHAEL Hoover is a 61 y.o. male presenting with  intractable nausea and vomiting. PMH is significant for T2DM, CVA, history of severe depression, history of middle cerebral aneurysm, HTN, history of migraine headaches, HLD, vertigo, Hx migraine.  Intractable Nausea/Vomiting: Patient presenting with 2 weeks of nausea and vomiting after recent admission for the same. Potassium of 3.1. WBC of 13.6. Lipase 32. AST/ALT wnl.  Indicates a 10 pound weight loss since recent discharge, however appears to be 3lbs per chart. After extensive workup (gastric emptying study normal, EGD with benign-appearing Schatzki ring and mild esophagitis, barium swallow with nonspecific esophageal motility disorder) at most recent admission, etiology thought to be secondary to possible medical nausea or even central cause. Known history of vertigo, reporting worsening of vomiting with movements and dizziness. Differential severe vertigo vs. Central cause of nausea vs cyclic vomiting? Vs psych. No sick contacts, less likely viral or bacteria given prolonged course. Increased intracranial pressure or meningitis is unlikely. Orthostatic vitals negative.  - Admit to med surg, Dr. Ree Kida attending  - Received fluid bolus x 1 in ED - consider GIPP if repeated diarrhea - Continue NS @125   - CBC and CMET in the AM - Vestibular PT - Meclizine PRN  Lactic Acidosis: Likely in the setting of dehydration due to vomiting. Normal anion gap. LA 2.45 - trend LA   AKI: Cr 1.12 on admission. Baseline around 0.8.  - IVF   Leukocytosis: Mild elevation to 13.6. Afebrile. Likely  reactive. History and Physical does not point to an infectious source.  - monitor   Hypokalemia. K 3.1 - s/p 57meQ Kdur   - Magnesium  HTN: currently on Norvasc, and lopressor. BP slightly elevated  - Restart lopressor - hold norvasc, - restart after adequate hydration   T2DM: A1C 8.5 on 10/20/16. On glipizide - holding glipizide - Continue SSI   Headache: Not severe per patient. Normal neurologic exam.  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 Stable mild chronic microvascular ischemic changes and parenchymal volume loss of the brain. He reports he is scheduled for a repeat MRI/MRA in June at [facility] where his neurologist is.  - continue Plavix  - continue ASA 81 mg  - continue Lipitor 40mg   - will order MRI/MRA head    Severe depression: Denies any SI, denies any depression currently. Denies ingestion any substances or any drug use. Currently takes prozac  - Tylenol, ethanol, and UDS - Continue Prozac    Sleep Disorder  - Holding Restoril   Prolonged QTc  - Avoid QT prolonging medications  - Repeat EKG in the AM   FEN/GI: Advance diet as tolerated  Prophylaxis: Lovenox   Disposition: admit to med-surg   History of Present Illness:  [name] is a 61 y.o. male presenting with intractable nausea and vomiting after recent admission for the same. Since leaving the hospital, sometimes food will stay down but more often it will not. Oddly, he can always keep down cold watermelon. Food would stay down  for maybe an hour. Emesis is slighlty brown and liquid. If he moves, he feels sick. He has dizzines with standing, or even moving his eyes rapidly. Has history of HA since his strokes. He has a HA now it is in the back of the head and radiates to the neck. No specific pattern, not a continuous.  Has HA almost daily. HA may have changed, usually in the temples. Reports he lost 10 more lbs since discharge (appears as 3lbs from chart). Has been having some BMs with last BM yesterday. Possibly some diarrhea. No fevers, chills, chest pain , SOB, abdominal pain (other than soreness at his hiatal hernia). Reports he is schduled for MRI/MRA by neurologist in June. Did report 2 days of diarrhea last week  In ED, orthostatics positive. S/p 2L NS bolus. CT abdomen pelvis without acute findings.  Review Of Systems: Per HPI with the following additions:   Review of Systems  Constitutional: Positive for weight loss. Negative for chills and fever.  Respiratory: Negative for shortness of breath.   Cardiovascular: Negative for chest pain and palpitations.  Gastrointestinal: Positive for diarrhea, nausea and vomiting. Negative for abdominal pain.  Genitourinary: Negative for dysuria and frequency.    Patient Active Problem List   Diagnosis Date Noted  . Dysphagia   . Esophageal ring   . Gastroesophageal reflux disease with esophagitis   . Intractable vomiting with nausea 10/01/2016  . 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 Severe episode of recurrent major depressive disorder, without psychotic features ([place]) 07/20/2016  . CVA (cerebral vascular accident) ([place]) 06/15/2016  . Abdominal pain   . Numbness   . Carpal tunnel syndrome, bilateral 12/16/2015  . Status post stroke 11/12/2015  . Transient neurologic deficit 10/15/2015  . Cerebrovascular accident, late effects 10/07/2015  . Complicated migraine   . Anxiety state   . 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 Cerebral infarction due to embolism of right middle cerebral artery ([place]) 09/23/2015  . Cerebral infarction due to embolism of cerebral artery ([place]) 09/23/2015  . Gait disturbance, post-stroke   . Drooping of mouth   . Cerebrovascular accident (CVA) due to embolism of right anterior cerebral artery ([place])   . Benign essential HTN   .  Migraine with aura and without status migrainosus, not intractable   . Tachypnea   . Prediabetes   . Acute blood loss anemia   . Brain aneurysm 09/20/2015  . Type 2 diabetes mellitus ([place]) 09/15/2015  . Leukocytosis 09/15/2015  . Aneurysm, cerebral, nonruptured 09/15/2015  . TIA (transient ischemic attack) 09/14/2015  . Temporary cerebral vascular dysfunction 09/14/2015  . H/O transient cerebral ischemia 07/29/2015  . At risk for falling 07/29/2015  . Bilateral hearing loss 10/25/2014  . Abnormal fear 08/08/2012  . Headache, migraine 08/08/2012  . Arthritis, degenerative 08/08/2012  . History of tear of ACL (anterior cruciate ligament) 03/08/2011  . History of knee problem 03/08/2011  . Depression 09/07/2010  . Depressive disorder, not elsewhere classified 09/07/2010  . URI (upper respiratory infection) 08/24/2010  . Hearing loss 09/06/2006  . Hyperlipidemia 04/23/2006  . Essential hypertension 04/23/2006  . Psychophysiological insomnia 04/23/2006    Past Medical History: Past Medical History:  Diagnosis Date  . Allergy   . Anxiety   . Cataract   . Cerebral hemorrhage ([place])   . Chest pain, atypical    12/2003:  negative cardiolyte  . Depression   . Drug abuse   . Eczema   . Erectile dysfunction   . History of meniscal  tear    bilateral  . HLD (hyperlipidemia)   . HTN (hypertension)   . Hx of tear of ACL (anterior cruciate ligament)    right  . Insomnia   . Olecranon bursitis of left elbow 10/2009   s/p I&D by Dr Maxie Better, initially assessed by Dr. Nori Riis   . Prediabetes   . Stroke ([place])   . TIA (transient ischemic attack)     Past Surgical History: Past Surgical History:  Procedure Laterality Date  . EP IMPLANTABLE DEVICE N/A 12/30/2015   Procedure: Loop Recorder Insertion;  Surgeon: Thompson Grayer, MD;  Location: [place] CV LAB;  Service: Cardiovascular;  Laterality: N/A;  . ESOPHAGOGASTRODUODENOSCOPY N/A 10/03/2016   Procedure: ESOPHAGOGASTRODUODENOSCOPY (EGD);   Surgeon: Irene Shipper, MD;  Location: [facility] ENDOSCOPY;  Service: Endoscopy;  Laterality: N/A;  . INCISE AND DRAIN ABCESS     L elbow due to cellulitis/bursitis  . INNER EAR SURGERY    . IR GENERIC HISTORICAL  12/28/2015   IR ANGIO VERTEBRAL SEL VERTEBRAL UNI L MOD SED 12/28/2015 Luanne Bras, MD [facility]INTER[facility] RAD  . IR GENERIC HISTORICAL  12/28/2015   IR ANGIO VERTEBRAL SEL SUBCLAVIAN INNOMINATE UNI R MOD SED 12/28/2015 Luanne Bras, MD [facility]  . IR GENERIC HISTORICAL  12/28/2015   IR ANGIO INTRA EXTRACRAN SEL INTERNAL CAROTID BILAT MOD SED 12/28/2015 Luanne Bras, MD [facility]  . KNEE ARTHROSCOPY    . RADIOLOGY WITH ANESTHESIA N/A 09/20/2015   Procedure: EMBOLIZATION         (RADIOLOGY WITH ANESTHESIA);  Surgeon: Luanne Bras, MD;  Location: [facility];  Service: Radiology;  Laterality: N/A;  . SKIN TAG REMOVAL     11 removed  . TEE WITHOUT CARDIOVERSION N/A 12/30/2015   Procedure: TRANSESOPHAGEAL ECHOCARDIOGRAM (TEE);  Surgeon: Larey Dresser, MD;  Location: [facility] ENDOSCOPY;  Service: Cardiovascular;  Laterality: N/A;    Social History: Social History  Substance Use Topics  . Smoking status: Never Smoker  . Smokeless tobacco: Never Used  . Alcohol use No   Additional social history: no alcohol, drugs, or tobacco.   Please also refer to relevant sections of EMR.  Family History: Family History  Problem Relation Age of Onset  . Stroke Mother   . Hypertension Mother   . Aneurysm Mother 87       Died of brain aneursym  . Heart failure Father   . Emphysema Father   . Diabetes Mellitus II Sister   . Colon cancer Neg Hx   . Rectal cancer Neg Hx   . Stomach cancer Neg Hx    Allergies and Medications: No Known Allergies No current facility-administered medications on file prior to encounter.    Current Outpatient Prescriptions on File Prior to Encounter  Medication Sig Dispense Refill  . amLODipine (NORVASC) 10 MG tablet Take 1 tablet (10 mg total) by mouth daily. 30  tablet 0  . aspirin EC 81 MG tablet Take 81 mg by mouth daily.    Marland Kitchen atorvastatin (LIPITOR) 40 MG tablet Take 1 tablet (40 mg total) by mouth daily at 6 PM. 30 tablet 0  . clopidogrel (PLAVIX) 75 MG tablet Take 1 tablet (75 mg total) by mouth daily. 30 tablet 0  . FLUoxetine (PROZAC) 20 MG capsule Take 1 capsule (20 mg total) by mouth daily. Take with 40 mg for a total of 60 mg 90 capsule 1  . gabapentin (NEURONTIN) 300 MG capsule Take 300 mg by mouth at bedtime.    Marland Kitchen glipiZIDE (GLUCOTROL)  10 MG tablet Take 10 mg by mouth daily before breakfast.    . metoprolol (LOPRESSOR) 50 MG tablet Take 1 tablet (50 mg total) by mouth 2 (two) times daily. 60 tablet 0  . pantoprazole (PROTONIX) 40 MG tablet Take 1 tablet (40 mg total) by mouth daily. 30 tablet 0  . temazepam (RESTORIL) 15 MG capsule Take 15-30 mg by mouth at bedtime as needed for sleep.      Objective: BP (!) 163/90   Pulse 98   Temp 99.4 F (37.4 C) (Oral)   Resp 14   SpO2 94%  Exam: General: Obese male lying in bed in NAD.  Eyes: nausea induced by EOM testing, PEERLA ENTM: MMM, poor dentition  Neck: supple, no JVD  Cardiovascular: RRR, no murmur  Respiratory: CTAB, easy WOB Gastrointestinal: soft, protuberant, nondistended, midline epigastric TTP, +BS MSK: normal muscle tone and bulk Derm: no rashes on visualized skin  Neuro: CN II-XII grossly intact, FTN testing normal Psych: melancholy mood, affect appropriate  Labs and Imaging: CBC BMET   Recent Labs Lab 10/20/16 1308  WBC 13.6*  HGB 15.6  HCT 45.3  PLT 398    Recent Labs Lab 10/20/16 1308  NA 137  K 3.1*  CL 101  CO2 24  BUN 14  CREATININE 1.12  GLUCOSE 171*  CALCIUM 9.0    LFT: normal Lipase 32 Lactic acid 2.45 Trop 0.01  EKG: tachycardia, sinus rhythm, no changes from prior.   CT Abdomen/Pelvis:  IMPRESSION: 1. No acute abdominal findings, mass lesions or adenopathy. 2. Multiple bladder calculi. 3. Colonic diverticulosis without findings for  acute diverticulitis. 4. Multiple small anterior abdominal wall hernias containing fat. There is also a right inguinal hernia.  Sela Hilding, MD 10/20/2016, 4:29 PM PGY-1, [place] Intern pager: [phone], text pages welcome  UPPER LEVEL ADDENDUM  I have read the above note and made revisions highlighted in blue.  Smiley Houseman, MD PGY-2 [facility] Pager [phone]

## 2016-10-20 NOTE — ED Provider Notes (Signed)
[facility] DEPT Provider Note   CSN: [id number] Arrival date & time: 10/20/16  1258     History   Chief Complaint Chief Complaint  Patient presents with  . Emesis    HPI [name] is a 61 y.o. male.  HPI [name] is a 61 y.o. male with hx of CVA, cerebral hemorrhage, HTN, vertigo, diabetes, Presents to emergency department complaining of nausea, vomiting, generalized malaise. Patient states that he has been sick for approximately 3 weeks. He states that he was admitted 2 weeks ago, states that he went home not feeling any better. Since discharge she has been laying in bed every day and has had intermittent nausea and vomiting. He states he lost 20 pounds since beginning of illness. He reports intermittent abdominal pain. Reports bloating. Denies any chest pressure or shortness of breath. Reports dizziness. States that he was told his symptoms may be due to gastroparesis or possibly vertigo from prior strokes. Patient went to primary care doctor today and was told to come here. Patient states he is taking some medications that he was prescribed for this but states they're not helping and does not recall the name of the medications.  Past Medical History:  Diagnosis Date  . Allergy   . Anxiety   . Cataract   . Cerebral hemorrhage ([place])   . Chest pain, atypical    12/2003:  negative cardiolyte  . Depression   . Drug abuse   . Eczema   . Erectile dysfunction   . History of meniscal tear    bilateral  . HLD (hyperlipidemia)   . HTN (hypertension)   . Hx of tear of ACL (anterior cruciate ligament)    right  . Insomnia   . Olecranon bursitis of left elbow 10/2009   s/p I&D by Dr Maxie Better, initially assessed by Dr. Nori Riis   . Prediabetes   . Stroke ([place])   . TIA (transient ischemic attack)     Patient Active Problem List   Diagnosis Date Noted  . Dysphagia   . Esophageal ring   . Gastroesophageal reflux disease with esophagitis   . Intractable vomiting with nausea  10/01/2016  . 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 Severe episode of recurrent major depressive disorder, without psychotic features ([place]) 07/20/2016  . CVA (cerebral vascular accident) ([place]) 06/15/2016  . Abdominal pain   . Numbness   . Carpal tunnel syndrome, bilateral 12/16/2015  . Status post stroke 11/12/2015  . Transient neurologic deficit 10/15/2015  . Cerebrovascular accident, late effects 10/07/2015  . Complicated migraine   . Anxiety state   . 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 Cerebral infarction due to embolism of right middle cerebral artery ([place]) 09/23/2015  . Cerebral infarction due to embolism of cerebral artery ([place]) 09/23/2015  . Gait disturbance, post-stroke   . Drooping of mouth   . Cerebrovascular accident (CVA) due to embolism of right anterior cerebral artery ([place])   . Benign essential HTN   . Migraine with aura and without status migrainosus, not intractable   . Tachypnea   . Prediabetes   . Acute blood loss anemia   . Brain aneurysm 09/20/2015  . Type 2 diabetes mellitus ([place]) 09/15/2015  . Leukocytosis 09/15/2015  . Aneurysm, cerebral, nonruptured 09/15/2015  . TIA (transient ischemic attack) 09/14/2015  . Temporary cerebral vascular dysfunction 09/14/2015  . H/O transient cerebral ischemia 07/29/2015  . At risk for falling 07/29/2015  . Bilateral hearing loss 10/25/2014  . Abnormal fear 08/08/2012  .  Headache, migraine 08/08/2012  . Arthritis, degenerative 08/08/2012  . History of tear of ACL (anterior cruciate ligament) 03/08/2011  . History of knee problem 03/08/2011  . Depression 09/07/2010  . Depressive disorder, not elsewhere classified 09/07/2010  . URI (upper respiratory infection) 08/24/2010  . Hearing loss 09/06/2006  . Hyperlipidemia 04/23/2006  . Essential hypertension 04/23/2006  . Psychophysiological insomnia 04/23/2006    Past Surgical History:  Procedure Laterality Date  . EP IMPLANTABLE DEVICE N/A 12/30/2015   Procedure: Loop  Recorder Insertion;  Surgeon: Thompson Grayer, MD;  Location: [facility];  Service: Cardiovascular;  Laterality: N/A;  . ESOPHAGOGASTRODUODENOSCOPY N/A 10/03/2016   Procedure: ESOPHAGOGASTRODUODENOSCOPY (EGD);  Surgeon: Irene Shipper, MD;  Location: [facility] ENDOSCOPY;  Service: Endoscopy;  Laterality: N/A;  . INCISE AND DRAIN ABCESS     L elbow due to cellulitis/bursitis  . INNER EAR SURGERY    . IR GENERIC HISTORICAL  12/28/2015   IR ANGIO VERTEBRAL SEL VERTEBRAL UNI L MOD SED 12/28/2015 Luanne Bras, MD [facility]  . IR GENERIC HISTORICAL  12/28/2015   IR ANGIO VERTEBRAL SEL SUBCLAVIAN INNOMINATE UNI R MOD SED 12/28/2015 Luanne Bras, MD [facility]  . IR GENERIC HISTORICAL  12/28/2015   IR ANGIO INTRA EXTRACRAN SEL INTERNAL CAROTID BILAT MOD SED 12/28/2015 Luanne Bras, MD [facility]  . KNEE ARTHROSCOPY    . RADIOLOGY WITH ANESTHESIA N/A 09/20/2015   Procedure: EMBOLIZATION         (RADIOLOGY WITH ANESTHESIA);  Surgeon: Luanne Bras, MD;  Location: [facility];  Service: Radiology;  Laterality: N/A;  . SKIN TAG REMOVAL     11 removed  . TEE WITHOUT CARDIOVERSION N/A 12/30/2015   Procedure: TRANSESOPHAGEAL ECHOCARDIOGRAM (TEE);  Surgeon: Larey Dresser, MD;  Location: [facility];  Service: Cardiovascular;  Laterality: N/A;       Home Medications    Prior to Admission medications   Medication Sig Start Date End Date Taking? Authorizing Provider  amLODipine (NORVASC) 10 MG tablet Take 1 tablet (10 mg total) by mouth daily. 12/31/15   Arrien, Jimmy Picket, MD  aspirin EC 81 MG tablet Take 81 mg by mouth daily.    [provider]  atorvastatin (LIPITOR) 40 MG tablet Take 1 tablet (40 mg total) by mouth daily at 6 PM. 12/31/15   Arrien, Jimmy Picket, MD  clopidogrel (PLAVIX) 75 MG tablet Take 1 tablet (75 mg total) by mouth daily. 09/30/15   Angiulli, Lavon Paganini, PA-C  FLUoxetine (PROZAC) 20 MG capsule Take 1 capsule (20 mg total) by mouth daily. Take with 40 mg for a  total of 60 mg 09/06/16   Eksir, Richard Miu, MD  gabapentin (NEURONTIN) 300 MG capsule Take 300 mg by mouth at bedtime.    [provider]  glipiZIDE (GLUCOTROL) 10 MG tablet Take 10 mg by mouth daily before breakfast.    [provider]  metoprolol (LOPRESSOR) 50 MG tablet Take 1 tablet (50 mg total) by mouth 2 (two) times daily. 12/31/15   Arrien, Jimmy Picket, MD  pantoprazole (PROTONIX) 40 MG tablet Take 1 tablet (40 mg total) by mouth daily. 10/06/16   Sela Hilding, MD  temazepam (RESTORIL) 15 MG capsule Take 15-30 mg by mouth at bedtime as needed for sleep.    [provider]    Family History Family History  Problem Relation Age of Onset  . Stroke Mother   . Hypertension Mother   . Aneurysm Mother 46       Died of  brain aneursym  . Heart failure Father   . Emphysema Father   . Diabetes Mellitus II Sister   . Colon cancer Neg Hx   . Rectal cancer Neg Hx   . Stomach cancer Neg Hx     Social History Social History  Substance Use Topics  . Smoking status: Never Smoker  . Smokeless tobacco: Never Used  . Alcohol use No     Allergies   Patient has no known allergies.   Review of Systems Review of Systems  Constitutional: Negative for chills and fever.  Respiratory: Negative for cough, chest tightness and shortness of breath.   Cardiovascular: Negative for chest pain, palpitations and leg swelling.  Gastrointestinal: Positive for abdominal pain, nausea and vomiting. Negative for abdominal distention and diarrhea.  Genitourinary: Negative for dysuria, frequency, hematuria and urgency.  Musculoskeletal: Negative for arthralgias, myalgias, neck pain and neck stiffness.  Skin: Negative for rash.  Allergic/Immunologic: Negative for immunocompromised state.  Neurological: Positive for dizziness and light-headedness. Negative for weakness, numbness and headaches.  All other systems reviewed and are negative.    Physical Exam Updated  Vital Signs BP (!) 158/94 (BP Location: Left Arm)   Pulse (!) 113   Temp 99.4 F (37.4 C) (Oral)   Resp 18   SpO2 95%   Physical Exam  Constitutional: He is oriented to person, place, and time. He appears well-developed and well-nourished. No distress.  HENT:  Head: Normocephalic and atraumatic.  Oral mucosa dry  Eyes: Conjunctivae are normal.  Neck: Neck supple.  Cardiovascular: Normal rate, regular rhythm and normal heart sounds.   Pulmonary/Chest: Effort normal. No respiratory distress. He has no wheezes. He has no rales.  Abdominal: Soft. Bowel sounds are normal. He exhibits no distension. There is tenderness. There is no rebound.  Diffuse mild discomfort. Soft ventral hernia, reducible  Musculoskeletal: He exhibits no edema.  Neurological: He is alert and oriented to person, place, and time.  Skin: Skin is warm and dry.  Nursing note and vitals reviewed.    ED Treatments / Results  Labs (all labs ordered are listed, but only abnormal results are displayed) Labs Reviewed  COMPREHENSIVE METABOLIC PANEL - Abnormal; Notable for the following:       Result Value   Potassium 3.1 (*)    Glucose, Bld 171 (*)    All other components within normal limits  CBC - Abnormal; Notable for the following:    WBC 13.6 (*)    All other components within normal limits  I-STAT CG4 LACTIC ACID, ED - Abnormal; Notable for the following:    Lactic Acid, Venous 2.45 (*)    All other components within normal limits  LIPASE, BLOOD  URINALYSIS, ROUTINE W REFLEX MICROSCOPIC  I-STAT TROPOININ, ED    EKG  EKG Interpretation  Date/Time:  Friday Oct 20 2016 13:09:38 EDT Ventricular Rate:  116 PR Interval:  140 QRS Duration: 90 QT Interval:  360 QTC Calculation: 500 R Axis:   51 Text Interpretation:  Sinus tachycardia with occasional Premature ventricular complexes Cannot rule out Anterior infarct , age undetermined Abnormal ECG Confirmed by RAY MD, Andee Poles [phone]) on 10/20/2016 4:39:58  PM       Radiology Ct Abdomen Pelvis W Contrast  Result Date: 10/20/2016 CLINICAL DATA:  One month history of nausea. EXAM: CT ABDOMEN AND PELVIS WITH CONTRAST TECHNIQUE: Multidetector CT imaging of the abdomen and pelvis was performed using the standard protocol following bolus administration of intravenous contrast. CONTRAST:  149mL ISOVUE-300 IOPAMIDOL (ISOVUE-300)  INJECTION 61% COMPARISON:  None. FINDINGS: Lower chest: The lung bases are clear of acute process. No pleural effusion or pulmonary lesions. The heart is normal in size. No pericardial effusion. The distal esophagus and aorta are unremarkable. Hepatobiliary: No focal hepatic lesions or intrahepatic biliary dilatation. The gallbladder appears normal. No common bile duct dilatation. Pancreas: No mass, inflammation or ductal dilatation. Spleen: Normal size.  No focal lesions. Adrenals/Urinary Tract: The adrenal glands are normal. The kidneys demonstrate normal enhancement/ perfusion. No worrisome renal lesions. No collecting system abnormalities on the delayed images. The ureters appear normal. Multiple bladder calculi are noted. Stomach/Bowel: The stomach, duodenum, small bowel and colon are grossly normal without oral contrast. No inflammatory changes, mass lesions or obstructive findings. The terminal ileum and appendix are normal. Scattered colonic diverticulosis without findings for acute diverticulitis. Vascular/Lymphatic: The aorta is normal in caliber. No dissection. The branch vessels are patent. The major venous structures are patent. No mesenteric or retroperitoneal mass or adenopathy. Small scattered lymph nodes are noted. Reproductive: The prostate gland is mildly enlarged. The seminal vesicles appear normal. Other: Small right inguinal hernia containing fat. There is also a small periumbilical abdominal wall hernia containing fat. Slightly more superiorly there is a small anterior abdominal wall hernia containing fat but no  incarceration. The third hernia is noted more superiorly at the level of the stomach. This contains fat and vessels. No incarceration. Musculoskeletal: No significant bony findings. IMPRESSION: 1. No acute abdominal findings, mass lesions or adenopathy. 2. Multiple bladder calculi. 3. Colonic diverticulosis without findings for acute diverticulitis. 4. Multiple small anterior abdominal wall hernias containing fat. There is also a right inguinal hernia. Electronically Signed   By: Marijo Sanes M.D.   On: 10/20/2016 15:55    Procedures Procedures (including critical care time)  Medications Ordered in ED Medications  sodium chloride 0.9 % bolus 1,000 mL (not administered)  LORazepam (ATIVAN) injection 0.5 mg (not administered)     Initial Impression / Assessment and Plan / ED Course  I have reviewed the triage vital signs and the nursing notes.  Pertinent labs & imaging results that were available during my care of the patient were reviewed by me and considered in my medical decision making (see chart for details).   patient in emergency department with persistent nausea and vomiting for the last 3 weeks. He is tachycardic, appears to feeling poorly. He was recently admitted for similar complaints and has had pretty extensive evaluation. He had an endoscopy, ultrasound abdomen, nuclear med gastric emptying study.  We'll give fluids, will administer Ativan for possible vertigo. Patient did have some dizziness moving around in bed. Check labs and CT abdomen.   4:35 PM Labs with no significant findings other than slightly elevated lactic acid of 2.45, potassium 3.1. CT with no acute findings. Patient received 1 L of fluids, he continues to be tachycardic upon standing with heart rate going as high as 120. I believe he'll need further hydration and evaluation. We'll continue IV fluids and admitted. Spoke with family practice who with the patient.  Vitals:   10/20/16 1303 10/20/16 1613 10/20/16  1615  BP: (!) 158/94 (!) 153/82 (!) 163/90  Pulse: (!) 113 98 98  Resp: 18 14   Temp: 99.4 F (37.4 C)    TempSrc: Oral    SpO2: 95% 94% 94%    Final Clinical Impressions(s) / ED Diagnoses   Final diagnoses:  Nausea and vomiting, intractability of vomiting not specified, unspecified vomiting type  New Prescriptions New Prescriptions   No medications on file     Jeannett Senior, PA-C 10/20/16 1638    Jeannett Senior, PA-C 10/20/16 1642    Pattricia Boss, MD 10/22/16 [phone]

## 2016-10-20 NOTE — ED Notes (Signed)
Attempted report 

## 2016-10-20 NOTE — ED Triage Notes (Signed)
Pt was sent over by his PCP for weakness, loss of appetite and emesis. He reports emesis x 3 today. He denies being in any pain, denies diarrhea.

## 2016-10-20 NOTE — ED Notes (Signed)
Pt states he feels very weak, feels warm to the touch and has clammy skin. Acuity increased to 2. EKG done at triage.

## 2016-10-20 NOTE — Progress Notes (Signed)
MD in room

## 2016-10-20 NOTE — ED Notes (Signed)
Patient transported to CT 

## 2016-10-21 DIAGNOSIS — Z8249 Family history of ischemic heart disease and other diseases of the circulatory system: Secondary | ICD-10-CM

## 2016-10-21 LAB — BASIC METABOLIC PANEL
Anion gap: 7 (ref 5–15)
BUN: 7 mg/dL (ref 6–20)
CALCIUM: 7.8 mg/dL — AB (ref 8.9–10.3)
CO2: 26 mmol/L (ref 22–32)
CREATININE: 0.83 mg/dL (ref 0.61–1.24)
Chloride: 106 mmol/L (ref 101–111)
GFR calc non Af Amer: >60 mL/min (ref >60)
Glucose, Bld: 100 mg/dL — ABNORMAL HIGH (ref 65–99)
Potassium: 3.2 mmol/L — ABNORMAL LOW (ref 3.5–5.1)
SODIUM: 139 mmol/L (ref 135–145)

## 2016-10-21 LAB — CBC
HCT: 39.8 % (ref 39.0–52.0)
Hemoglobin: 12.8 g/dL — ABNORMAL LOW (ref 13.0–17.0)
MCH: 27.9 pg (ref 26.0–34.0)
MCHC: 32.2 g/dL (ref 30.0–36.0)
MCV: 86.9 fL (ref 78.0–100.0)
Platelets: 330 10*3/uL (ref 150–400)
RBC: 4.58 MIL/uL (ref 4.22–5.81)
RDW: 14.2 % (ref 11.5–15.5)
WBC: 8.8 10*3/uL (ref 4.0–10.5)

## 2016-10-21 LAB — GLUCOSE, CAPILLARY
GLUCOSE-CAPILLARY: 95 mg/dL (ref 65–99)
Glucose-Capillary: 126 mg/dL — ABNORMAL HIGH (ref 65–99)
Glucose-Capillary: 102 mg/dL — ABNORMAL HIGH (ref 65–99)

## 2016-10-21 MED ORDER — INSULIN ASPART 100 UNIT/ML ~~LOC~~ SOLN
0.0000 [IU] | Freq: Three times a day (TID) | SUBCUTANEOUS | Status: DC
  Administered 2016-10-21 – 2016-10-24 (×3): 2 [IU] via SUBCUTANEOUS

## 2016-10-21 MED ORDER — POTASSIUM CHLORIDE CRYS ER 20 MEQ PO TBCR
40.0000 meq | EXTENDED_RELEASE_TABLET | Freq: Two times a day (BID) | ORAL | Status: AC
  Administered 2016-10-21 (×2): 40 meq via ORAL
  Filled 2016-10-21 (×2): qty 2

## 2016-10-21 MED ORDER — MECLIZINE HCL 25 MG PO TABS
25.0000 mg | ORAL_TABLET | Freq: Three times a day (TID) | ORAL | Status: AC
  Administered 2016-10-21 (×3): 25 mg via ORAL
  Filled 2016-10-21 (×3): qty 1

## 2016-10-21 MED ORDER — LORAZEPAM 2 MG/ML IJ SOLN: 2.0000 mg | Freq: Once | INTRAMUSCULAR | Status: DC

## 2016-10-21 NOTE — Evaluation (Signed)
Physical Therapy Evaluation Patient Details Name: [name] MRN: [id number] DOB: May 04, 1956 Today's Date: 10/21/2016   History of Present Illness  Allen R Dixonis a 61 y.o.malepresenting with intractable vomiting with hypokalemia. PMH is significant for T2DM, CVA, history of severe depression, history of middle cerebral aneurysm, HTN, history of migraine headaches, HLD, hx of left ear surgeries with malfunction since 61 years old.  Clinical Impression  During Vestibular assessment pt found to have R posterior canal BPPV and was treated with canal re-positioning maneuvers. However once pt in chair pt reports he feels it the most when he turns his head to the R and he needs to brace for it when he has to turn R. Pt was not tested for R horizontal canal BPPV due to pt with dry heaving and + vomitting with posterior canal testing. PT to re-assess pt for horizontal canal as able. Pt also re-educated on gaze stabilization techniques during transitions and movement. Pt was just admitted a couple of weeks ago for the vertigo and was treated then by our rehab team however pt reports the HHPT was not provided because "everyone was busy and was placed on a list." PT to follow up to ensure pt receives HHPT for vestibular rehab as pt is unable to drive.    Follow Up Recommendations Home health PT;Supervision/Assistance - 24 hour (for vestibular)    Equipment Recommendations  None recommended by PT    Recommendations for Other Services       Precautions / Restrictions Precautions Precautions: Fall Restrictions Weight Bearing Restrictions: No      Mobility  Bed Mobility Overal bed mobility: Independent                Transfers Overall transfer level: Needs assistance Equipment used: 1 person hand held assist Transfers: Sit to/from Stand Sit to Stand: Min guard         General transfer comment: v/c's for gaze stabilization, slow and guarded  Ambulation/Gait Ambulation/Gait  assistance: Min guard Ambulation Distance (Feet): 15 Feet Assistive device: 1 person hand held assist Gait Pattern/deviations: Step-through pattern;Decreased stride length;Wide base of support Gait velocity: slow Gait velocity interpretation: Below normal speed for age/gender General Gait Details: v/c's for gaze stabilization and slow movement to minimize dizziness  Stairs            Wheelchair Mobility    Modified Rankin (Stroke Patients Only)       Balance Overall balance assessment: Needs assistance Sitting-balance support: No upper extremity supported;Feet supported Sitting balance-Leahy Scale: Good     Standing balance support: Single extremity supported Standing balance-Leahy Scale: Fair Standing balance comment: slow and guarded due to having just underwent vestibular treatment                             Pertinent Vitals/Pain Pain Assessment: No/denies pain    Home Living Family/patient expects to be discharged to:: Private residence Living Arrangements: Alone Available Help at Discharge: Family;Available PRN/intermittently Type of Home: Apartment Home Access: Stairs to enter Entrance Stairs-Rails: Left;Right;Can reach both Entrance Stairs-Number of Steps: 3 + 14 Home Layout: One level Home Equipment: None      Prior Function Level of Independence: Independent         Comments: No longer drives.  Pt reports he wall walks and furniture walks.       Hand Dominance   Dominant Hand: Right    Extremity/Trunk Assessment   Upper Extremity Assessment Upper  Extremity Assessment: Overall WFL for tasks assessed (pt reports L side to be weaker)    Lower Extremity Assessment Lower Extremity Assessment: Overall WFL for tasks assessed (pt reports L sides weakness)    Cervical / Trunk Assessment Cervical / Trunk Assessment: Normal  Communication   Communication: HOH  Cognition Arousal/Alertness: Awake/alert Behavior During Therapy: Flat  affect (depressed spirits) Overall Cognitive Status: Within Functional Limits for tasks assessed                                 General Comments: pt known to have severe depression      General Comments General comments (skin integrity, edema, etc.): Vestibular Assessment: Pt placed in both L and R dix hallpike position and tested positive for R posterior canal BPPV. Pt with report of quick sensation in "wave" form when testing for L posterior canal. Pt with + dry heaving for both but worse with spitting when tested on the R. Pt treated with canal positioning maneuver.     Exercises     Assessment/Plan    PT Assessment Patient needs continued PT services  PT Problem List Decreased activity tolerance;Decreased balance;Decreased mobility;Decreased knowledge of use of DME;Decreased safety awareness;Decreased knowledge of precautions       PT Treatment Interventions      PT Goals (Current goals can be found in the Care Plan section)  Acute Rehab PT Goals Patient Stated Goal: to get better PT Goal Formulation: With patient Time For Goal Achievement: 10/28/16 Potential to Achieve Goals: Good Additional Goals Additional Goal #1: Pt will be indep in using gaze stabilization technique during all transfers and ambulation.    Frequency Min 3X/week   Barriers to discharge Decreased caregiver support      Co-evaluation               AM-PAC PT "6 Clicks" Daily Activity  Outcome Measure Difficulty turning over in bed (including adjusting bedclothes, sheets and blankets)?: None Difficulty moving from lying on back to sitting on the side of the bed? : None Difficulty sitting down on and standing up from a chair with arms (e.g., wheelchair, bedside commode, etc,.)?: A Little Help needed moving to and from a bed to chair (including a wheelchair)?: A Little Help needed walking in hospital room?: A Little Help needed climbing 3-5 steps with a railing? : A Little 6 Click  Score: 20    End of Session   Activity Tolerance:  (limited by nausea and dry heaving) Patient left: in chair;with call bell/phone within reach Nurse Communication: Mobility status PT Visit Diagnosis: BPPV;Dizziness and giddiness (R42) BPPV - Right/Left : Right    Time: 5681-2751 PT Time Calculation (min) (ACUTE ONLY): 30 min   Charges:   PT Evaluation $PT Eval Moderate Complexity: 1 Procedure PT Treatments $Canalith Rep Proc: 8-22 mins   PT G Codes:        Kittie Plater, PT, DPT Pager #: [phone] Office #: [phone]   Samaa Ueda M Jeramy Dimmick 10/21/2016, 4:21 PM

## 2016-10-21 NOTE — Progress Notes (Signed)
Family Medicine Teaching Service Daily Progress Note Intern Pager: [phone]  Patient name: [name] Medical record number: [id number] Date of birth: Sep 12, 1955 Age: 61 y.o. Gender: male  Primary Care Provider: Berkley Harvey, NP Consultants: none Code Status: FULL  Pt Overview and Major Events to Date:  5/25: admit for intractable vomiting  Assessment and Plan: [name] is a 61 y.o. male presenting with  intractable nausea and vomiting. PMH is significant for T2DM, CVA, history of severe depression, history of middle cerebral aneurysm, HTN, history of migraine headaches, HLD, vertigo, Hx migraine.  Intractable Nausea/Vomiting, improved: Differential severe vertigo vs. Central cause of nausea vs cyclic vomiting?  - MRI/MRA head ordered and pending - Vestibular PT - will schedule Meclizine 25 TID for 1 day to help patient work with PT.  Hypokalemia. K 3.2. Magnesium 2.  - kdur 40meq x 2 doses   HTN:BP stable and controlled - continue Lopressor - currently holding Norvasc due to normotension. Add back if needed.    T2DM:A1C 8.5 on 10/20/16. On glipizide. CBGs controlled. - holding glipizide - Continue SSI   Prolonged QTc  - Avoid QT prolonging medications (would continue Prozac in the setting off severe depression).  - Repeat EKG this AM with QTc improved to 492  Normocytic Anemia: hgb 12.8 (from 15.6) likely dilutional. Baseline hgb 12.8.  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 Stable mild chronic microvascular ischemic changes and parenchymal volume loss of the brain. He reports he is scheduled for a repeat MRI/MRA in June at [facility] where his neurologist is.  - Plavix  - ASA 81 mg  - Lipitor 40mg   - MRI/MRA  head   Severe depression: Denies any SI, denies any depression currently. Denies ingestion any substances or any drug use. Currently takes prozac  - Tylenol, ethanol, and UDS - Continue Prozac   Sleep Disorder  - Holding Restoril   AKI, resolved: Cr 1.12 on admission > 0.8. Baseline around 0.8.  - IVF   Leukocytosis,resolved  Lactic Acidosis, resolved: Likely in the setting of dehydration due to vomiting. Normal anion gap. LA 2.45 >1.5 >1.2   FEN/GI: Advance diet as tolerated  Prophylaxis: Lovenox   Disposition: pending evaluation  Subjective:  Patient doing better this morning. Still had dizziness when he stood up from bed, but able to ambulate to bathroom and able to drink liquids this morning.   Objective: Temp:  [97.7 F (36.5 C)-99.4 F (37.4 C)] 97.7 F (36.5 C) (05/26 0551) Pulse Rate:  [76-113] 78 (05/26 0551) Resp:  [14-18] 17 (05/26 0551) BP: (128-163)/(72-94) 128/76 (05/26 0551) SpO2:  [93 %-98 %] 98 % (05/26 0551) Weight:  [118.8 kg (261 lb 14.5 oz)] 118.8 kg (261 lb 14.5 oz) (05/25 1715) Physical Exam: GEN: NAD HEENT: EOMI (reports of dizziness with right lateral gaze and when looking down but no nystagmus noted) CV: RRR, no murmurs, rubs, or gallops PULM: CTAB, normal effort ABD: Soft, nontender, nondistended, NABS, no organomegaly SKIN: No rash or cyanosis; warm and well-perfused EXTR: No lower extremity edema or calf tenderness PSYCH: Mood and affect euthymic, normal rate and volume of speech NEURO: Awake, alert, no focal deficits grossly, normal speech   Laboratory:  Recent Labs Lab 10/20/16 1308 10/21/16 0453  WBC 13.6*  8.8  HGB 15.6 12.8*  HCT 45.3 39.8  PLT 398 330    Recent Labs Lab 10/20/16 1308 10/21/16 0453  NA 137 139  K 3.1* 3.2*  CL 101 106  CO2 24 26  BUN 14 7  CREATININE 1.12 0.83  CALCIUM 9.0 7.8*  PROT 7.8  --   BILITOT 1.0  --   ALKPHOS 101  --   ALT 37  --   AST 30  --   GLUCOSE 171* 100*      Smiley Houseman, MD 10/21/2016, 8:55 AM PGY-2, [place] Intern pager: [phone], text pages welcome

## 2016-10-21 NOTE — Progress Notes (Signed)
PT Cancellation Note  Patient Details Name: [name] MRN: [id number] DOB: 18-Aug-1955   Cancelled Treatment:    Reason Eval/Treat Not Completed: Other (comment). Pt requested PT to come back because he just finished lunch and he knows the vestibular treatment will make him vomit. Acute PT to return as able.   Vernis Cabacungan M Kalmen Lollar 10/21/2016, 3:23 PM   Kittie Plater, PT, DPT Pager #: [phone] Office #: [phone]

## 2016-10-21 NOTE — Progress Notes (Signed)
Pt for MRI of the head, I called the MRI asking what time is the test, indefinite as per the tech, pt aware.

## 2016-10-22 ENCOUNTER — Inpatient Hospital Stay (HOSPITAL_COMMUNITY): Payer: Managed Care, Other (non HMO)

## 2016-10-22 LAB — BASIC METABOLIC PANEL
ANION GAP: 7 (ref 5–15)
BUN: <5 mg/dL — ABNORMAL LOW (ref 6–20)
CALCIUM: 8.2 mg/dL — AB (ref 8.9–10.3)
CO2: 26 mmol/L (ref 22–32)
Chloride: 104 mmol/L (ref 101–111)
Creatinine, Ser: 0.84 mg/dL (ref 0.61–1.24)
GFR calc Af Amer: >60 mL/min (ref >60)
GFR calc non Af Amer: >60 mL/min (ref >60)
Glucose, Bld: 100 mg/dL — ABNORMAL HIGH (ref 65–99)
Potassium: 3.3 mmol/L — ABNORMAL LOW (ref 3.5–5.1)
Sodium: 137 mmol/L (ref 135–145)

## 2016-10-22 LAB — GLUCOSE, CAPILLARY
GLUCOSE-CAPILLARY: 94 mg/dL (ref 65–99)
GLUCOSE-CAPILLARY: 107 mg/dL — AB (ref 65–99)
Glucose-Capillary: 78 mg/dL (ref 65–99)
Glucose-Capillary: 118 mg/dL — ABNORMAL HIGH (ref 65–99)

## 2016-10-22 LAB — CBC
HCT: 40.3 % (ref 39.0–52.0)
Hemoglobin: 13.3 g/dL (ref 13.0–17.0)
MCH: 28.5 pg (ref 26.0–34.0)
MCHC: 33 g/dL (ref 30.0–36.0)
MCV: 86.5 fL (ref 78.0–100.0)
Platelets: 319 10*3/uL (ref 150–400)
RBC: 4.66 MIL/uL (ref 4.22–5.81)
RDW: 14 % (ref 11.5–15.5)
WBC: 10 10*3/uL (ref 4.0–10.5)

## 2016-10-22 MED ORDER — POTASSIUM CHLORIDE CRYS ER 20 MEQ PO TBCR
40.0000 meq | EXTENDED_RELEASE_TABLET | Freq: Two times a day (BID) | ORAL | Status: AC
Start: 2016-10-22 — End: 2016-10-22
  Administered 2016-10-22 (×2): 40 meq via ORAL
  Filled 2016-10-22 (×2): qty 2

## 2016-10-22 MED ORDER — MECLIZINE HCL 25 MG PO TABS
25.0000 mg | ORAL_TABLET | Freq: Three times a day (TID) | ORAL | Status: AC
  Administered 2016-10-22 (×2): 25 mg via ORAL
  Filled 2016-10-22 (×3): qty 1

## 2016-10-22 MED ORDER — GADOBENATE DIMEGLUMINE 529 MG/ML IV SOLN
20.0000 mL | Freq: Once | INTRAVENOUS | Status: AC | PRN
  Administered 2016-10-22: 20 mL via INTRAVENOUS

## 2016-10-22 NOTE — Progress Notes (Signed)
Family Medicine Teaching Service Daily Progress Note Intern Pager: [phone]  Patient name: [name] Medical record number: [id number] Date of birth: 1955/07/14 Age: 61 y.o. Gender: male  Primary Care Provider: Berkley Harvey, NP Consultants: None Code Status: FULL  Assessment and Plan: 61 y.o. male presenting with  intractable nausea and vomiting. PMH is significant for T2DM, CVA, history of severe depression, history of middle cerebral aneurysm, HTN, history of migraine headaches, HLD, vertigo, Hx migraine.  Intractable Nausea/Vomiting, improved: UDS in 09/2016 pan-negative. 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  - Follow up with ENT as outpatient  Hypokalemia. K 3.2>3.3. Magnesium 2.  - Kdur 21meq x 2 doses   HTN:BP stable and controlled - Continue Lopressor - Currently holding Norvasc due to normotension. Add back if indicated.  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 MRI with no acute process, stable ischemic disease. MRA with recurrent right pericallosal ACA 5x71mm aneurysm s/p pipeline stent diversion, not noted on CTA in 05/2016. - Plavix  - ASA 81 mg  - Lipitor 40mg   - Neurointerventional Radiology consulted, appreciate recommendations. Will come to evaluate 5/27 or 5/28. Anticipate some intervention, but will discuss with patient first.  FEN/GI: Advance diet as tolerated  Prophylaxis: Lovenox   Disposition: pending evaluation  Subjective:  Continues to note dizziness, nausea, and vomiting. Reports his symptoms were worse during therapy yesterday, but improved afterwards. Only tolerating liquids.  Follows with Dr. Guerry Bruin at [facility] for his history of aneurysm.   Objective: Temp:  [98.1 F (36.7 C)-98.2 F (36.8 C)] (P) 98.2 F (36.8 C) (05/27 0503) Pulse Rate:  [73-88] (P) 88 (05/27 0503) Resp:  [18-19] (P) 18 (05/27 0503) BP: (125)/(66-74) (P) 121/61 (05/27 0503) SpO2:  [98 %] 98 % (05/26 2037) Physical Exam: GEN: NAD, no apparent distress HEENT: EOMI (reports of dizziness with right lateral gaze and when looking down but no nystagmus noted) CV: RRR, no murmurs, rubs, or gallops PULM: CTAB, normal effort ABD: Soft, nontender, nondistended SKIN: No rash or cyanosis; warm and well-perfused EXTR: No lower extremity edema or calf tenderness PSYCH: flat affect, but just woke from sleep NEURO: Alert, no focal deficits grossly, normal speech  Laboratory:  Recent Labs Lab 10/20/16 1308 10/21/16 0453 10/22/16 0442  WBC 13.6* 8.8 10.0  HGB 15.6 12.8* 13.3  HCT 45.3 39.8 40.3  PLT 398 330 319    Recent Labs Lab 10/20/16 1308 10/21/16 0453 10/22/16 0442  NA 137 139 137  K 3.1* 3.2* 3.3*  CL 101 106 104  CO2 24 26 26   BUN 14 7 <5*  CREATININE 1.12 0.83 0.84  CALCIUM 9.0 7.8* 8.2*  PROT 7.8  --   --   BILITOT 1.0  --   --   ALKPHOS 101  --   --   ALT 37  --   --   AST 30  --   --   GLUCOSE 171* 100* 100*  - LA 2.45>1.2  Mr [facility] Contrast  Result Date: 10/22/2016 CLINICAL DATA:  Vertigo, intractable nausea and vomiting. History of stroke, diabetes, migraines. Status post RIGHT pericallosal pipeline stent. EXAM: MRI HEAD WITHOUT AND WITH CONTRAST MRA HEAD WITHOUT  CONTRAST TECHNIQUE: Multiplanar, multiecho pulse sequences of the brain and surrounding structures were obtained without and with intravenous contrast. Angiographic images of the head were obtained using MRA technique without contrast. CONTRAST:  20 cc MultiHance COMPARISON:  MRI of the head June 15, 2016 FINDINGS: MRI HEAD FINDINGS- sequences vary from mildly to moderately motion degraded. INTRACRANIAL  CONTENTS: No reduced diffusion to suggest acute ischemia. No susceptibility artifact to suggest hemorrhage. Faint chronic mineralization LEFT basal ganglia. The ventricles and sulci are normal for patient's age. No suspicious parenchymal signal, masses, mass effect. Similar scattered supratentorial and patchy pontine white matter FLAIR T2 hyperintensities. Prominent basal ganglia perivascular spaces associated with chronic small vessel ischemic disease No abnormal intraparenchymal or extra-axial enhancement. No abnormal extra-axial fluid collections. No extra-axial masses. VASCULAR: Normal major intracranial vascular flow voids present at skull base. Susceptibility artifact along ACA vessels corresponding known pipeline stent dura border. SKULL AND UPPER CERVICAL SPINE: No abnormal sellar expansion. No suspicious calvarial bone marrow signal. Craniocervical junction maintained. SINUSES/ORBITS: Bilateral maxillary mucosal retention cyst. Chronic ethmoid mucosal thickening. Mastoid air cells are well aerated.The included ocular globes and orbital contents are non-suspicious. OTHER: None. MRA HEAD FINDINGS ANTERIOR CIRCULATION: Normal flow related enhancement of the included cervical, petrous, cavernous and supraclinoid internal carotid arteries. Patent anterior communicating artery. Normal flow related enhancement of the anterior and middle cerebral arteries, including distal segments. Superiorly directed bilobed 5 x 3 mm aneurysm at site of prior 8 x 7 mm aneurysm RIGHT approximate A2-3 junction status post RIGHT ACA pipeline stent diverter. No large vessel occlusion, high-grade stenosis, abnormal luminal irregularity. POSTERIOR CIRCULATION: Codominant vertebral artery's. Basilar artery is patent, with normal flow related enhancement of the main branch vessels. Normal flow related enhancement of the posterior cerebral arteries. Robust RIGHT posterior communicating artery present. No large vessel occlusion, high-grade  stenosis, abnormal luminal irregularity, aneurysm. ANATOMIC VARIANTS: None. Source images and MIP images were reviewed. IMPRESSION: MRI HEAD: No acute intracranial process on this motion degraded examination. Stable examination including mild to moderate chronic small vessel ischemic disease. 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 No emergent large vessel occlusion or severe stenosis. Electronically Signed   By: Elon Alas M.D.   On: 10/22/2016 03:37   Mr Jeri Cos BJ Contrast  Result Date: 10/22/2016 CLINICAL DATA:  Vertigo, intractable nausea and vomiting. History of stroke, diabetes, migraines. Status post RIGHT pericallosal pipeline stent. EXAM: MRI HEAD WITHOUT AND WITH CONTRAST MRA HEAD WITHOUT CONTRAST TECHNIQUE: Multiplanar, multiecho pulse sequences of the brain and surrounding structures were obtained without and with intravenous contrast. Angiographic images of the head were obtained using MRA technique without contrast. CONTRAST:  20 cc MultiHance COMPARISON:  MRI of the head June 15, 2016 FINDINGS: MRI HEAD FINDINGS- sequences vary from mildly to moderately motion degraded. INTRACRANIAL CONTENTS: No reduced diffusion to suggest acute ischemia. No susceptibility artifact to suggest hemorrhage. Faint chronic mineralization LEFT basal ganglia. The ventricles and sulci are normal for patient's age. No suspicious parenchymal signal, masses, mass effect. Similar scattered supratentorial and patchy pontine white matter FLAIR T2 hyperintensities. Prominent basal ganglia perivascular spaces associated with chronic small vessel ischemic disease No abnormal intraparenchymal or extra-axial enhancement. No abnormal extra-axial fluid collections. No extra-axial masses. VASCULAR: Normal major intracranial vascular flow voids present at skull base. Susceptibility artifact along ACA vessels corresponding known pipeline stent dura border. SKULL AND  UPPER CERVICAL SPINE: No abnormal sellar expansion. No suspicious calvarial bone marrow signal. Craniocervical junction  maintained. SINUSES/ORBITS: Bilateral maxillary mucosal retention cyst. Chronic ethmoid mucosal thickening. Mastoid air cells are well aerated.The included ocular globes and orbital contents are non-suspicious. OTHER: None. MRA HEAD FINDINGS ANTERIOR CIRCULATION: Normal flow related enhancement of the included cervical, petrous, cavernous and supraclinoid internal carotid arteries. Patent anterior communicating artery. Normal flow related enhancement of the anterior and middle cerebral arteries, including distal segments. Superiorly directed bilobed 5 x 3 mm aneurysm at site of prior 8 x 7 mm aneurysm RIGHT approximate A2-3 junction status post RIGHT ACA pipeline stent diverter. No large vessel occlusion, high-grade stenosis, abnormal luminal irregularity. POSTERIOR CIRCULATION: Codominant vertebral artery's. Basilar artery is patent, with normal flow related enhancement of the main branch vessels. Normal flow related enhancement of the posterior cerebral arteries. Robust RIGHT posterior communicating artery present. No large vessel occlusion, high-grade stenosis, abnormal luminal irregularity, aneurysm. ANATOMIC VARIANTS: None. Source images and MIP images were reviewed. IMPRESSION: MRI HEAD: No acute intracranial process on this motion degraded examination. Stable examination including mild to moderate chronic small vessel ischemic disease. 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 No emergent large vessel occlusion or severe stenosis. Electronically Signed   By: Elon Alas M.D.   On: 10/22/2016 03:37   Nm Gastric Emptying  Result Date: 10/04/2016 CLINICAL DATA:  Nausea and vomiting for 2 weeks EXAM: NUCLEAR MEDICINE GASTRIC EMPTYING SCAN TECHNIQUE: After oral ingestion of radiolabeled meal, sequential abdominal images were  obtained for 4 hours. Percentage of activity emptying the stomach was calculated at 1 hour, 2 hour, 3 hour, and 4 hours. RADIOPHARMACEUTICALS:  2 mCi Tc-58m sulfur colloid in standardized meal COMPARISON:  None. FINDINGS: Expected location of the stomach in the left upper quadrant. Ingested meal empties the stomach gradually over the course of the study. 1% emptied at 1 hr ( normal >= 10%) 28% emptied at 2 hr ( normal >= 40%) 26% emptied at 3 hr ( normal >= 70%) 90% emptied at 4 hr ( normal >= 90%) IMPRESSION: Slow initial gastric emptying though the patient did demonstrate normal gastric emptying by the conclusion of the exam at 4 hours. Electronically Signed   By: Lavonia Dana M.D.   On: 10/04/2016 15:22   Ct Abdomen Pelvis W Contrast  Result Date: 10/20/2016 CLINICAL DATA:  One month history of nausea. EXAM: CT ABDOMEN AND PELVIS WITH CONTRAST TECHNIQUE: Multidetector CT imaging of the abdomen and pelvis was performed using the standard protocol following bolus administration of intravenous contrast. CONTRAST:  186mL ISOVUE-300 IOPAMIDOL (ISOVUE-300) INJECTION 61% COMPARISON:  None. FINDINGS: Lower chest: The lung bases are clear of acute process. No pleural effusion or pulmonary lesions. The heart is normal in size. No pericardial effusion. The distal esophagus and aorta are unremarkable. Hepatobiliary: No focal hepatic lesions or intrahepatic biliary dilatation. The gallbladder appears normal. No common bile duct dilatation. Pancreas: No mass, inflammation or ductal dilatation. Spleen: Normal size.  No focal lesions. Adrenals/Urinary Tract: The adrenal glands are normal. The kidneys demonstrate normal enhancement/ perfusion. No worrisome renal lesions. No collecting system abnormalities on the delayed images. The ureters appear normal. Multiple bladder calculi are noted. Stomach/Bowel: The stomach, duodenum, small bowel and colon are grossly normal without oral contrast. No inflammatory changes, mass lesions  or obstructive findings. The terminal ileum and appendix are normal. Scattered colonic diverticulosis without findings for acute diverticulitis. Vascular/Lymphatic: The aorta is normal in caliber. No dissection. The branch vessels are patent. The major venous structures are patent. No mesenteric  or retroperitoneal mass or adenopathy. Small scattered lymph nodes are noted. Reproductive: The prostate gland is mildly enlarged. The seminal vesicles appear normal. Other: Small right inguinal hernia containing fat. There is also a small periumbilical abdominal wall hernia containing fat. Slightly more superiorly there is a small anterior abdominal wall hernia containing fat but no incarceration. The third hernia is noted more superiorly at the level of the stomach. This contains fat and vessels. No incarceration. Musculoskeletal: No significant bony findings. IMPRESSION: 1. No acute abdominal findings, mass lesions or adenopathy. 2. Multiple bladder calculi. 3. Colonic diverticulosis without findings for acute diverticulitis. 4. Multiple small anterior abdominal wall hernias containing fat. There is also a right inguinal hernia. Electronically Signed   By: Marijo Sanes M.D.   On: 10/20/2016 15:55   Dg Esophagus  Result Date: 10/03/2016 CLINICAL DATA:  Difficulty swallowing. EXAM: ESOPHOGRAM/BARIUM SWALLOW TECHNIQUE: Single contrast examination was performed using  thin barium. FLUOROSCOPY TIME:  Fluoroscopy Time:  2 minutes and 6 seconds. Radiation Exposure Index (if provided by the fluoroscopic device): 18.5 mGy Number of Acquired Spot Images: 0 COMPARISON:  None. FINDINGS: Study was limited by patient immobility. Patient had EGD earlier today and appeared somewhat lethargic at the time of exam. Oblique views of the esophagus shows no diverticulum, gross ulceration, or mass lesion. Mild Schatzki's ring evident. Patient was placed supine LPO relative to the fluoro table and monitored fluoroscopically while swallowing.  This demonstrates disruption of primary peristalsis on multiple swallows with some mild tertiary contractions but no overt presbyesophagus. A 13 mm barium tablet did pass into the stomach when taken with water. IMPRESSION: 1. Nonspecific esophageal motility disorder. At one point during the exam with stasis of barium in the esophagus, the patient reported that this was similar to the symptoms he was having at home. 2. Mild Schatzki ring without obstruction to passage of a 13 mm barium tablet. Electronically Signed   By: Misty Stanley M.D.   On: 10/03/2016 16:03   Dg Abd 2 Views  Result Date: 10/01/2016 CLINICAL DATA:  Vomiting.  Abdominal pain. EXAM: ABDOMEN - 2 VIEW COMPARISON:  None. FINDINGS: The bowel gas pattern is normal. There is no evidence of free air. No radio-opaque calculi or other significant radiographic abnormality is seen. IMPRESSION: Negative. Electronically Signed   By: Dorise Bullion III M.D   On: 10/01/2016 21:33   US Abdomen Limited Ruq  Result Date: 10/04/2016 CLINICAL DATA:  Nausea vomiting for 2 weeks. EXAM: US ABDOMEN LIMITED - RIGHT UPPER QUADRANT COMPARISON:  None. FINDINGS: Gallbladder: No gallstones or wall thickening visualized. No sonographic Murphy sign noted by sonographer. Common bile duct: Diameter: 7.1 mm.  No intraductal calculus is seen. Liver: Heterogeneously increased echogenicity. IMPRESSION: No evidence of cholelithiasis. Borderline increased in size extrahepatic common bile duct measuring 7.1 mm. Diffusely increased heterogeneous echogenicity of the liver, usually associated with intrinsic liver disease. Electronically Signed   By: Fidela Salisbury M.D.   On: 10/04/2016 07:29   Lorna Few, DO 10/22/2016, 10:05 AM PGY-3, [place] Intern pager: [phone], text pages welcome

## 2016-10-22 NOTE — Progress Notes (Signed)
PT Cancellation Note  Patient Details Name: [name] MRN: [id number] DOB: Nov 06, 1955   Cancelled Treatment:    Reason Eval/Treat Not Completed: Medical issues which prohibited therapy. Pt reports that he had an MRI early this AM and was diagnosed with another aneurysm in his brain. Pt is awaiting consultation for interventions. Will check back when stable to continue.    Scheryl Marten PT, DPT  [phone]  10/22/2016, 3:14 PM

## 2016-10-23 LAB — GLUCOSE, CAPILLARY
GLUCOSE-CAPILLARY: 126 mg/dL — AB (ref 65–99)
Glucose-Capillary: 100 mg/dL — ABNORMAL HIGH (ref 65–99)
Glucose-Capillary: 85 mg/dL (ref 65–99)
Glucose-Capillary: 123 mg/dL — ABNORMAL HIGH (ref 65–99)

## 2016-10-23 MED ORDER — MECLIZINE HCL 25 MG PO TABS
25.0000 mg | ORAL_TABLET | Freq: Three times a day (TID) | ORAL | Status: AC | PRN
  Filled 2016-10-23: qty 1

## 2016-10-23 MED ORDER — ENSURE ENLIVE PO LIQD: 237.0000 mL | Freq: Two times a day (BID) | ORAL | Status: DC

## 2016-10-23 MED ORDER — MECLIZINE HCL 25 MG PO TABS: 25.0000 mg | ORAL_TABLET | Freq: Three times a day (TID) | ORAL | Status: DC

## 2016-10-23 NOTE — Progress Notes (Signed)
Patient ID: [name], male   DOB: 1955/07/06, 61 y.o.   MRN: [id number]    Referring Physician(s): Dr. 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  He had a follow up angiogram done on 12-30-15 which revealed obliteration of the aneurysm and wide patency of the stent.    He had been admitted now secondary to vertigo and N/V with a 19lbs weight loss secondary to the aforementioned.  He had a MRA to evaluate his head.  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  We have been asked to evaluate him to determine if these are related.  The patient denies any vision changes.  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  He states movement makes him feel "car sick" and subsequently nauseated.  He denies any numbness or tingling around his mouth or face.    Allergies: Patient has no known allergies.  Medications: Prior to Admission medications   Medication Sig Start Date End Date Taking? Authorizing Provider  amLODipine (NORVASC) 10 MG tablet Take 1 tablet (10 mg total) by mouth daily. 12/31/15  Yes Arrien, Jimmy Picket, MD  aspirin EC 81 MG tablet Take 81 mg by mouth daily.   Yes [provider]  atorvastatin (LIPITOR) 40 MG tablet Take 1 tablet (40 mg total) by mouth daily at 6 PM. 12/31/15  Yes Arrien, Jimmy Picket, MD  clopidogrel (PLAVIX) 75 MG tablet Take 1 tablet (75 mg total) by mouth daily. 09/30/15  Yes Angiulli, Lavon Paganini, PA-C  FLUoxetine (PROZAC) 20 MG capsule Take 1 capsule (20 mg total) by mouth daily. Take with 40 mg for a total of 60 mg 09/06/16  Yes Eksir, Richard Miu, MD  gabapentin (NEURONTIN) 300 MG capsule Take 300 mg by mouth at  bedtime.   Yes [provider]  glipiZIDE (GLUCOTROL) 10 MG tablet Take 10 mg by mouth daily before breakfast.   Yes [provider]  metoprolol (LOPRESSOR) 50 MG tablet Take 1 tablet (50 mg total) by mouth 2 (two) times daily. 12/31/15  Yes Arrien, Jimmy Picket, MD  pantoprazole (PROTONIX) 40 MG tablet Take 1 tablet (40 mg total) by mouth daily. 10/06/16  Yes Sela Hilding, MD  temazepam (RESTORIL) 15 MG capsule Take 15-30 mg by mouth at bedtime as needed for sleep.   Yes [provider]    Vital Signs: BP 130/79   Pulse 69   Temp 98.2 F (36.8 C)   Resp 18   Wt 261 lb 14.5 oz (118.8 kg)   SpO2 97%   BMI 39.82 kg/m   Physical Exam: Gen: NAD, obese white male Heart: regular rate and rhythm Lungs: CTAB Abd: soft, NT, ND, obese, +BS  Imaging: Mr Virgel Paling YW Contrast  Result Date: 10/22/2016 CLINICAL DATA:  Vertigo, intractable nausea and vomiting. History of stroke, diabetes, migraines. Status post RIGHT pericallosal pipeline stent. EXAM: MRI HEAD WITHOUT AND WITH CONTRAST MRA HEAD WITHOUT CONTRAST TECHNIQUE: Multiplanar, multiecho pulse sequences of the brain and surrounding structures were obtained without and with intravenous contrast. Angiographic images of the head were obtained using MRA technique without contrast. CONTRAST:  20 cc MultiHance COMPARISON:  MRI of the head June 15, 2016 FINDINGS: MRI  HEAD FINDINGS- sequences vary from mildly to moderately motion degraded. INTRACRANIAL CONTENTS: No reduced diffusion to suggest acute ischemia. No susceptibility artifact to suggest hemorrhage. Faint chronic mineralization LEFT basal ganglia. The ventricles and sulci are normal for patient's age. No suspicious parenchymal signal, masses, mass effect. Similar scattered supratentorial and patchy pontine white matter FLAIR T2 hyperintensities. Prominent basal ganglia perivascular spaces associated with chronic small vessel ischemic disease No abnormal  intraparenchymal or extra-axial enhancement. No abnormal extra-axial fluid collections. No extra-axial masses. VASCULAR: Normal major intracranial vascular flow voids present at skull base. Susceptibility artifact along ACA vessels corresponding known pipeline stent dura border. SKULL AND UPPER CERVICAL SPINE: No abnormal sellar expansion. No suspicious calvarial bone marrow signal. Craniocervical junction maintained. SINUSES/ORBITS: Bilateral maxillary mucosal retention cyst. Chronic ethmoid mucosal thickening. Mastoid air cells are well aerated.The included ocular globes and orbital contents are non-suspicious. OTHER: None. MRA HEAD FINDINGS ANTERIOR CIRCULATION: Normal flow related enhancement of the included cervical, petrous, cavernous and supraclinoid internal carotid arteries. Patent anterior communicating artery. Normal flow related enhancement of the anterior and middle cerebral arteries, including distal segments. Superiorly directed bilobed 5 x 3 mm aneurysm at site of prior 8 x 7 mm aneurysm RIGHT approximate A2-3 junction status post RIGHT ACA pipeline stent diverter. No large vessel occlusion, high-grade stenosis, abnormal luminal irregularity. POSTERIOR CIRCULATION: Codominant vertebral artery's. Basilar artery is patent, with normal flow related enhancement of the main branch vessels. Normal flow related enhancement of the posterior cerebral arteries. Robust RIGHT posterior communicating artery present. No large vessel occlusion, high-grade stenosis, abnormal luminal irregularity, aneurysm. ANATOMIC VARIANTS: None. Source images and MIP images were reviewed. IMPRESSION: MRI HEAD: No acute intracranial process on this motion degraded examination. Stable examination including mild to moderate chronic small vessel ischemic disease. 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 No emergent large vessel occlusion or severe stenosis. Electronically  Signed   By: Elon Alas M.D.   On: 10/22/2016 03:37   Mr Jeri Cos XK Contrast  Result Date: 10/22/2016 CLINICAL DATA:  Vertigo, intractable nausea and vomiting. History of stroke, diabetes, migraines. Status post RIGHT pericallosal pipeline stent. EXAM: MRI HEAD WITHOUT AND WITH CONTRAST MRA HEAD WITHOUT CONTRAST TECHNIQUE: Multiplanar, multiecho pulse sequences of the brain and surrounding structures were obtained without and with intravenous contrast. Angiographic images of the head were obtained using MRA technique without contrast. CONTRAST:  20 cc MultiHance COMPARISON:  MRI of the head June 15, 2016 FINDINGS: MRI HEAD FINDINGS- sequences vary from mildly to moderately motion degraded. INTRACRANIAL CONTENTS: No reduced diffusion to suggest acute ischemia. No susceptibility artifact to suggest hemorrhage. Faint chronic mineralization LEFT basal ganglia. The ventricles and sulci are normal for patient's age. No suspicious parenchymal signal, masses, mass effect. Similar scattered supratentorial and patchy pontine white matter FLAIR T2 hyperintensities. Prominent basal ganglia perivascular spaces associated with chronic small vessel ischemic disease No abnormal intraparenchymal or extra-axial enhancement. No abnormal extra-axial fluid collections. No extra-axial masses. VASCULAR: Normal major intracranial vascular flow voids present at skull base. Susceptibility artifact along ACA vessels corresponding known pipeline stent dura border. SKULL AND UPPER CERVICAL SPINE: No abnormal sellar expansion. No suspicious calvarial bone marrow signal. Craniocervical junction maintained. SINUSES/ORBITS: Bilateral maxillary mucosal retention cyst. Chronic ethmoid mucosal thickening. Mastoid air cells are well aerated.The included ocular globes and orbital contents are non-suspicious. OTHER: None. MRA HEAD FINDINGS ANTERIOR CIRCULATION: Normal flow related enhancement of the included cervical, petrous, cavernous and  supraclinoid internal carotid arteries.  Patent anterior communicating artery. Normal flow related enhancement of the anterior and middle cerebral arteries, including distal segments. Superiorly directed bilobed 5 x 3 mm aneurysm at site of prior 8 x 7 mm aneurysm RIGHT approximate A2-3 junction status post RIGHT ACA pipeline stent diverter. No large vessel occlusion, high-grade stenosis, abnormal luminal irregularity. POSTERIOR CIRCULATION: Codominant vertebral artery's. Basilar artery is patent, with normal flow related enhancement of the main branch vessels. Normal flow related enhancement of the posterior cerebral arteries. Robust RIGHT posterior communicating artery present. No large vessel occlusion, high-grade stenosis, abnormal luminal irregularity, aneurysm. ANATOMIC VARIANTS: None. Source images and MIP images were reviewed. IMPRESSION: MRI HEAD: No acute intracranial process on this motion degraded examination. Stable examination including mild to moderate chronic small vessel ischemic disease. 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 No emergent large vessel occlusion or severe stenosis. Electronically Signed   By: Elon Alas M.D.   On: 10/22/2016 03:37   Ct Abdomen Pelvis W Contrast  Result Date: 10/20/2016 CLINICAL DATA:  One month history of nausea. EXAM: CT ABDOMEN AND PELVIS WITH CONTRAST TECHNIQUE: Multidetector CT imaging of the abdomen and pelvis was performed using the standard protocol following bolus administration of intravenous contrast. CONTRAST:  169mL ISOVUE-300 IOPAMIDOL (ISOVUE-300) INJECTION 61% COMPARISON:  None. FINDINGS: Lower chest: The lung bases are clear of acute process. No pleural effusion or pulmonary lesions. The heart is normal in size. No pericardial effusion. The distal esophagus and aorta are unremarkable. Hepatobiliary: No focal hepatic lesions or intrahepatic biliary dilatation. The gallbladder  appears normal. No common bile duct dilatation. Pancreas: No mass, inflammation or ductal dilatation. Spleen: Normal size.  No focal lesions. Adrenals/Urinary Tract: The adrenal glands are normal. The kidneys demonstrate normal enhancement/ perfusion. No worrisome renal lesions. No collecting system abnormalities on the delayed images. The ureters appear normal. Multiple bladder calculi are noted. Stomach/Bowel: The stomach, duodenum, small bowel and colon are grossly normal without oral contrast. No inflammatory changes, mass lesions or obstructive findings. The terminal ileum and appendix are normal. Scattered colonic diverticulosis without findings for acute diverticulitis. Vascular/Lymphatic: The aorta is normal in caliber. No dissection. The branch vessels are patent. The major venous structures are patent. No mesenteric or retroperitoneal mass or adenopathy. Small scattered lymph nodes are noted. Reproductive: The prostate gland is mildly enlarged. The seminal vesicles appear normal. Other: Small right inguinal hernia containing fat. There is also a small periumbilical abdominal wall hernia containing fat. Slightly more superiorly there is a small anterior abdominal wall hernia containing fat but no incarceration. The third hernia is noted more superiorly at the level of the stomach. This contains fat and vessels. No incarceration. Musculoskeletal: No significant bony findings. IMPRESSION: 1. No acute abdominal findings, mass lesions or adenopathy. 2. Multiple bladder calculi. 3. Colonic diverticulosis without findings for acute diverticulitis. 4. Multiple small anterior abdominal wall hernias containing fat. There is also a right inguinal hernia. Electronically Signed   By: Marijo Sanes M.D.   On: 10/20/2016 15:55    Labs:  CBC:  Recent Labs  10/05/16 0715 10/20/16 1308 10/21/16 0453 10/22/16 0442  WBC 11.4* 13.6* 8.8 10.0  HGB 14.4 15.6 12.8* 13.3  HCT 43.3 45.3 39.8 40.3  PLT 280 398 330  319    COAGS:  Recent Labs  12/28/15 0705 06/15/16 1546  INR 1.02 0.98  APTT 25 24    BMP:  Recent Labs  10/05/16 0715 10/20/16 1308 10/21/16 0453 10/22/16 0442  NA 137 137 139 137  K 3.9 3.1* 3.2* 3.3*  CL 105 101 106 104  CO2 23 24 26 26   GLUCOSE 111* 171* 100* 100*  BUN 6 14 7  <5*  CALCIUM 8.2* 9.0 7.8* 8.2*  CREATININE 0.83 1.12 0.83 0.84  GFRNONAA >60 >60 >60 >60  GFRAA >60 >60 >60 >60    LIVER FUNCTION TESTS:  Recent Labs  06/15/16 1546 10/01/16 1330 10/02/16 0706 10/03/16 0609 10/20/16 1308  BILITOT 0.6 1.4* 0.8  --  1.0  AST 29 45* 45*  --  30  ALT 38 52 47  --  37  ALKPHOS 89 101 76  --  101  PROT 7.0 7.6 5.8*  --  7.8  ALBUMIN 3.6 3.7 3.0* 2.7* 4.2    Assessment and Plan: 1. H/o pipeline diverting stent placement for a pericallosal aneurysm with possible recurrence.  The patient's symptoms are not consistent, according to [place]. Deveshwar, with his MRA findings.  He believes his vertigo and N/V are from some other source.  However, he would like to proceed with either a CTA or outpatient diagnostic cerebral angiogram to further assess this area to determine if any other follow up treatment is needed.  This is not emergent and can be scheduled as an outpatient once his current symptoms are improved.  This was relayed to the patient as well as the primary service.  The patient understood and was agreeable.  Electronically Signed: Henreitta Cea 10/23/2016, 11:56 AM   I spent a total of 25 Minutes at the the patient's bedside AND on the patient's hospital floor or unit, greater than 50% of which was counseling/coordinating care for pericallosal aneurysm

## 2016-10-23 NOTE — Progress Notes (Signed)
Family Medicine Teaching Service Daily Progress Note Intern Pager: [phone]  Patient name: [name] Medical record number: [id number] Date of birth: 07-24-1955 Age: 61 y.o. Gender: male  Primary Care Provider: Berkley Harvey, NP Consultants: None Code Status: FULL  Assessment and Plan: 61 y.o. male presenting with  intractable nausea and vomiting. PMH is significant for T2DM, CVA, history of severe depression, history of middle cerebral aneurysm, HTN, history of migraine headaches, HLD, vertigo, Hx migraine.  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 Patient with a pipeline stent for a pericallosal ACA aneurysm, multiple embolic strokes noted on previous MRIs. MRI with no acute process, stable ischemic disease.  - Plavix  - ASA 81 mg  - Lipitor 40mg   - Neurointerventional Radiology consulted, appreciate recommendations.   Intractable Nausea/Vomiting, improved: UDS in 09/2016 negative. 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 - Will add back meclizine 25 TID if needed in the future - Given readmission and problems establishing with home health, high risk for bounce back.  - Follow up with ENT as outpatient  Hypokalemia. K 3.2>3.3 > not obtained today. Magnesium 2.  - will get BMET 5/29  HTN:BP stable and controlled - Continue Lopressor - Currently holding Norvasc due to normotension. Add back if indicated.  FEN/GI: Advance diet as tolerated  Prophylaxis: Lovenox   Disposition: pending evaluation  Subjective:  Patient doing well. Still having intermittent dizziness if he moves. Very concerned about his aneurysm, wants it addressed today. Has mild headache in the temples bilaterally.   Objective: Temp:  [98 F (36.7 C)-98.2 F (36.8 C)] 98.2 F (36.8 C) (05/28 0500) Pulse Rate:  [69-74] 69 (05/28  0500) Resp:  [18] 18 (05/28 0500) BP: (121-130)/(60-79) 130/79 (05/28 0500) SpO2:  [96 %-97 %] 97 % (05/28 0500) Physical Exam: GEN: NAD, no apparent distress HEENT: EOMI, no nystagmus noted.  CV: RRR, no murmurs, rubs, or gallops PULM: CTAB, normal effort ABD: Soft, nontender, nondistended EXTR: No lower extremity edema or calf tenderness PSYCH: flat affect NEURO: Alert however sometimes seems to get lost in conversation, no focal deficits grossly, normal speech   Laboratory:  Recent Labs Lab 10/20/16 1308 10/21/16 0453 10/22/16 0442  WBC 13.6* 8.8 10.0  HGB 15.6 12.8* 13.3  HCT 45.3 39.8 40.3  PLT 398 330 319    Recent Labs Lab 10/20/16 1308 10/21/16 0453 10/22/16 0442  NA 137 139 137  K 3.1* 3.2* 3.3*  CL 101 106 104  CO2 24 26 26   BUN 14 7 <5*  CREATININE 1.12 0.83 0.84  CALCIUM 9.0 7.8* 8.2*  PROT 7.8  --   --   BILITOT 1.0  --   --   ALKPHOS 101  --   --   ALT 37  --   --   AST 30  --   --   GLUCOSE 171* 100* 100*   LA 2.45>1.2  Mr Jodene Nam Head Wo Contrast  Result Date: 10/22/2016 CLINICAL DATA:  Vertigo, intractable nausea and vomiting. History of stroke, diabetes, migraines. Status post RIGHT pericallosal pipeline stent. EXAM: MRI HEAD WITHOUT AND WITH CONTRAST MRA HEAD WITHOUT CONTRAST TECHNIQUE: Multiplanar, multiecho pulse sequences of the brain and surrounding structures were obtained without and with intravenous contrast. Angiographic images of the head were obtained using MRA technique without contrast. CONTRAST:  20 cc MultiHance COMPARISON:  MRI of the head June 15, 2016 FINDINGS: MRI HEAD FINDINGS- sequences vary from mildly to moderately motion degraded. INTRACRANIAL CONTENTS: No reduced diffusion to suggest acute ischemia. No susceptibility artifact to suggest hemorrhage. Faint chronic mineralization LEFT basal ganglia. The ventricles and sulci are normal for patient's age. No suspicious parenchymal signal, masses, mass effect. Similar scattered  supratentorial and patchy pontine white matter FLAIR T2 hyperintensities. Prominent basal ganglia perivascular spaces associated with chronic small vessel ischemic disease No abnormal intraparenchymal or extra-axial enhancement. No abnormal extra-axial fluid collections. No extra-axial masses. VASCULAR: Normal major intracranial vascular flow voids present at skull base. Susceptibility artifact along ACA vessels corresponding known pipeline stent dura border. SKULL AND UPPER CERVICAL SPINE: No abnormal sellar expansion. No suspicious calvarial bone marrow signal. Craniocervical junction maintained. SINUSES/ORBITS: Bilateral maxillary mucosal retention cyst. Chronic ethmoid mucosal thickening. Mastoid air cells are well aerated.The included ocular globes and orbital contents are non-suspicious. OTHER: None. MRA HEAD FINDINGS ANTERIOR CIRCULATION: Normal flow related enhancement of the included cervical, petrous, cavernous and supraclinoid internal carotid arteries. Patent anterior communicating artery. Normal flow related enhancement of the anterior and middle cerebral arteries, including distal segments. Superiorly directed bilobed 5 x 3 mm aneurysm at site of prior 8 x 7 mm aneurysm RIGHT approximate A2-3 junction status post RIGHT ACA pipeline stent diverter. No large vessel occlusion, high-grade stenosis, abnormal luminal irregularity. POSTERIOR CIRCULATION: Codominant vertebral artery's. Basilar artery is patent, with normal flow related enhancement of the main branch vessels. Normal flow related enhancement of the posterior cerebral arteries. Robust RIGHT posterior communicating artery present. No large vessel occlusion, high-grade stenosis, abnormal luminal irregularity, aneurysm. ANATOMIC VARIANTS: None. Source images and MIP images were reviewed. IMPRESSION: MRI HEAD: No acute intracranial process on this motion degraded examination. Stable examination including mild to moderate chronic small vessel ischemic  disease. 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 No emergent large vessel occlusion or severe stenosis. Electronically Signed   By: Elon Alas M.D.   On: 10/22/2016 03:37   Mr Jeri Cos GB Contrast  Result Date: 10/22/2016 CLINICAL DATA:  Vertigo, intractable nausea and vomiting. History of stroke, diabetes, migraines. Status post RIGHT pericallosal pipeline stent. EXAM: MRI HEAD WITHOUT AND WITH CONTRAST MRA HEAD WITHOUT CONTRAST TECHNIQUE: Multiplanar, multiecho pulse sequences of the brain and surrounding structures were obtained without and with intravenous contrast. Angiographic images of the head were obtained using MRA technique without contrast. CONTRAST:  20 cc MultiHance COMPARISON:  MRI of the head June 15, 2016 FINDINGS: MRI HEAD FINDINGS- sequences vary from mildly to moderately motion degraded. INTRACRANIAL CONTENTS: No reduced diffusion to suggest acute ischemia. No susceptibility artifact to suggest hemorrhage. Faint chronic mineralization LEFT basal ganglia. The ventricles and sulci are normal for patient's age. No suspicious parenchymal signal, masses, mass effect. Similar scattered supratentorial and patchy pontine white matter FLAIR T2 hyperintensities. Prominent basal ganglia perivascular spaces associated with chronic small vessel ischemic disease No abnormal intraparenchymal or extra-axial enhancement. No abnormal extra-axial fluid collections. No extra-axial masses. VASCULAR: Normal major intracranial vascular flow voids present at skull base. Susceptibility artifact along ACA vessels corresponding known pipeline stent dura border. SKULL AND UPPER CERVICAL SPINE: No abnormal sellar expansion. No suspicious calvarial bone marrow signal. Craniocervical junction maintained. SINUSES/ORBITS: Bilateral maxillary mucosal retention cyst. Chronic ethmoid mucosal thickening. Mastoid air cells are well aerated.The included ocular  globes and orbital contents are non-suspicious. OTHER: None. MRA HEAD FINDINGS ANTERIOR CIRCULATION: Normal flow related enhancement of the included cervical, petrous, cavernous and  supraclinoid internal carotid arteries. Patent anterior communicating artery. Normal flow related enhancement of the anterior and middle cerebral arteries, including distal segments. Superiorly directed bilobed 5 x 3 mm aneurysm at site of prior 8 x 7 mm aneurysm RIGHT approximate A2-3 junction status post RIGHT ACA pipeline stent diverter. No large vessel occlusion, high-grade stenosis, abnormal luminal irregularity. POSTERIOR CIRCULATION: Codominant vertebral artery's. Basilar artery is patent, with normal flow related enhancement of the main branch vessels. Normal flow related enhancement of the posterior cerebral arteries. Robust RIGHT posterior communicating artery present. No large vessel occlusion, high-grade stenosis, abnormal luminal irregularity, aneurysm. ANATOMIC VARIANTS: None. Source images and MIP images were reviewed. IMPRESSION: MRI HEAD: No acute intracranial process on this motion degraded examination. Stable examination including mild to moderate chronic small vessel ischemic disease. 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 No emergent large vessel occlusion or severe stenosis. Electronically Signed   By: Elon Alas M.D.   On: 10/22/2016 03:37   Nm Gastric Emptying  Result Date: 10/04/2016 CLINICAL DATA:  Nausea and vomiting for 2 weeks EXAM: NUCLEAR MEDICINE GASTRIC EMPTYING SCAN TECHNIQUE: After oral ingestion of radiolabeled meal, sequential abdominal images were obtained for 4 hours. Percentage of activity emptying the stomach was calculated at 1 hour, 2 hour, 3 hour, and 4 hours. RADIOPHARMACEUTICALS:  2 mCi Tc-35m sulfur colloid in standardized meal COMPARISON:  None. FINDINGS: Expected location of the stomach in the left upper quadrant.  Ingested meal empties the stomach gradually over the course of the study. 1% emptied at 1 hr ( normal >= 10%) 28% emptied at 2 hr ( normal >= 40%) 26% emptied at 3 hr ( normal >= 70%) 90% emptied at 4 hr ( normal >= 90%) IMPRESSION: Slow initial gastric emptying though the patient did demonstrate normal gastric emptying by the conclusion of the exam at 4 hours. Electronically Signed   By: Lavonia Dana M.D.   On: 10/04/2016 15:22   Ct Abdomen Pelvis W Contrast  Result Date: 10/20/2016 CLINICAL DATA:  One month history of nausea. EXAM: CT ABDOMEN AND PELVIS WITH CONTRAST TECHNIQUE: Multidetector CT imaging of the abdomen and pelvis was performed using the standard protocol following bolus administration of intravenous contrast. CONTRAST:  175mL ISOVUE-300 IOPAMIDOL (ISOVUE-300) INJECTION 61% COMPARISON:  None. FINDINGS: Lower chest: The lung bases are clear of acute process. No pleural effusion or pulmonary lesions. The heart is normal in size. No pericardial effusion. The distal esophagus and aorta are unremarkable. Hepatobiliary: No focal hepatic lesions or intrahepatic biliary dilatation. The gallbladder appears normal. No common bile duct dilatation. Pancreas: No mass, inflammation or ductal dilatation. Spleen: Normal size.  No focal lesions. Adrenals/Urinary Tract: The adrenal glands are normal. The kidneys demonstrate normal enhancement/ perfusion. No worrisome renal lesions. No collecting system abnormalities on the delayed images. The ureters appear normal. Multiple bladder calculi are noted. Stomach/Bowel: The stomach, duodenum, small bowel and colon are grossly normal without oral contrast. No inflammatory changes, mass lesions or obstructive findings. The terminal ileum and appendix are normal. Scattered colonic diverticulosis without findings for acute diverticulitis. Vascular/Lymphatic: The aorta is normal in caliber. No dissection. The branch vessels are patent. The major venous structures are patent.  No mesenteric or retroperitoneal mass or adenopathy. Small scattered lymph nodes are noted. Reproductive: The prostate gland is mildly enlarged. The seminal vesicles appear normal. Other: Small right inguinal hernia containing fat. There is also a small periumbilical abdominal wall hernia containing fat. Slightly more  superiorly there is a small anterior abdominal wall hernia containing fat but no incarceration. The third hernia is noted more superiorly at the level of the stomach. This contains fat and vessels. No incarceration. Musculoskeletal: No significant bony findings. IMPRESSION: 1. No acute abdominal findings, mass lesions or adenopathy. 2. Multiple bladder calculi. 3. Colonic diverticulosis without findings for acute diverticulitis. 4. Multiple small anterior abdominal wall hernias containing fat. There is also a right inguinal hernia. Electronically Signed   By: Marijo Sanes M.D.   On: 10/20/2016 15:55   Dg Esophagus  Result Date: 10/03/2016 CLINICAL DATA:  Difficulty swallowing. EXAM: ESOPHOGRAM/BARIUM SWALLOW TECHNIQUE: Single contrast examination was performed using  thin barium. FLUOROSCOPY TIME:  Fluoroscopy Time:  2 minutes and 6 seconds. Radiation Exposure Index (if provided by the fluoroscopic device): 18.5 mGy Number of Acquired Spot Images: 0 COMPARISON:  None. FINDINGS: Study was limited by patient immobility. Patient had EGD earlier today and appeared somewhat lethargic at the time of exam. Oblique views of the esophagus shows no diverticulum, gross ulceration, or mass lesion. Mild Schatzki's ring evident. Patient was placed supine LPO relative to the fluoro table and monitored fluoroscopically while swallowing. This demonstrates disruption of primary peristalsis on multiple swallows with some mild tertiary contractions but no overt presbyesophagus. A 13 mm barium tablet did pass into the stomach when taken with water. IMPRESSION: 1. Nonspecific esophageal motility disorder. At one point  during the exam with stasis of barium in the esophagus, the patient reported that this was similar to the symptoms he was having at home. 2. Mild Schatzki ring without obstruction to passage of a 13 mm barium tablet. Electronically Signed   By: Misty Stanley M.D.   On: 10/03/2016 16:03   Dg Abd 2 Views  Result Date: 10/01/2016 CLINICAL DATA:  Vomiting.  Abdominal pain. EXAM: ABDOMEN - 2 VIEW COMPARISON:  None. FINDINGS: The bowel gas pattern is normal. There is no evidence of free air. No radio-opaque calculi or other significant radiographic abnormality is seen. IMPRESSION: Negative. Electronically Signed   By: Dorise Bullion III M.D   On: 10/01/2016 21:33   US Abdomen Limited Ruq  Result Date: 10/04/2016 CLINICAL DATA:  Nausea vomiting for 2 weeks. EXAM: US ABDOMEN LIMITED - RIGHT UPPER QUADRANT COMPARISON:  None. FINDINGS: Gallbladder: No gallstones or wall thickening visualized. No sonographic Murphy sign noted by sonographer. Common bile duct: Diameter: 7.1 mm.  No intraductal calculus is seen. Liver: Heterogeneously increased echogenicity. IMPRESSION: No evidence of cholelithiasis. Borderline increased in size extrahepatic common bile duct measuring 7.1 mm. Diffusely increased heterogeneous echogenicity of the liver, usually associated with intrinsic liver disease. Electronically Signed   By: Fidela Salisbury M.D.   On: 10/04/2016 07:29   Archie Patten, MD 10/23/2016, 7:19 AM PGY-3, [facility] Intern pager: [phone], text pages welcome

## 2016-10-23 NOTE — Progress Notes (Signed)
Physical Therapy Treatment Patient Details Name: [name] MRN: [id number] DOB: 08/10/1955 Today's Date: 10/23/2016    History of Present Illness Allen R Dixonis a 61 y.o.malepresenting with intractable vomiting with hypokalemia. PMH is significant for T2DM, CVA, history of severe depression, history of middle cerebral aneurysm, HTN, history of migraine headaches, HLD, hx of left ear surgeries with malfunction since 61 years old.    PT Comments    Pt admitted with above diagnosis. Pt currently with functional limitations due to balance and endurance deficits. Pt was able to tolerate canalith repositioning for right BPPV.  Pt did not want to do more than the maneuver as he was napping on arrival.   Pt will benefit from skilled PT to increase their independence and safety with mobility to allow discharge to the venue listed below.     Follow Up Recommendations  Home health PT;Supervision/Assistance - 24 hour (for vestibular rehab)     Equipment Recommendations  None recommended by PT    Recommendations for Other Services       Precautions / Restrictions Precautions Precautions: Fall Restrictions Weight Bearing Restrictions: No    Mobility  Bed Mobility Overal bed mobility: Independent                Transfers                    Ambulation/Gait                 Stairs            Wheelchair Mobility    Modified Rankin (Stroke Patients Only)       Balance                                            Cognition Arousal/Alertness: Awake/alert Behavior During Therapy: Flat affect (depressed spirits) Overall Cognitive Status: Within Functional Limits for tasks assessed                                 General Comments: pt known to have severe depression      Exercises      General Comments General comments (skin integrity, edema, etc.): Vestibular testing reveals right BPPV posterior canal therefore  repositioned with canalith repositioning maneuver.  Pt still felt nauseated but did not vomit today.  Instructed to not lie flat.  Pt had been awakened on PT arrival therefore agreed to the manuevers but did not want to do more.       Pertinent Vitals/Pain Pain Assessment: No/denies pain    Home Living                      Prior Function            PT Goals (current goals can now be found in the care plan section) Acute Rehab PT Goals Patient Stated Goal: to get better Progress towards PT goals: Progressing toward goals    Frequency    Min 3X/week      PT Plan Current plan remains appropriate    Co-evaluation              AM-PAC PT "6 Clicks" Daily Activity  Outcome Measure  Difficulty turning over in bed (including adjusting bedclothes, sheets and blankets)?: None Difficulty moving from lying  on back to sitting on the side of the bed? : None Difficulty sitting down on and standing up from a chair with arms (e.g., wheelchair, bedside commode, etc,.)?: A Little Help needed moving to and from a bed to chair (including a wheelchair)?: A Little Help needed walking in hospital room?: A Little Help needed climbing 3-5 steps with a railing? : A Little 6 Click Score: 20    End of Session   Activity Tolerance: Patient limited by fatigue Patient left: with call bell/phone within reach;in bed Nurse Communication: Mobility status PT Visit Diagnosis: BPPV;Dizziness and giddiness (R42) BPPV - Right/Left : Right     Time: 9935-7017 PT Time Calculation (min) (ACUTE ONLY): 16 min  Charges:  $Canalith Rep Proc: 8-22 mins                    G Codes:       Johany Hansman,PT Acute Rehabilitation [phone] (pager)    Denice Paradise 10/23/2016, 4:37 PM

## 2016-10-23 NOTE — Progress Notes (Signed)
INTERVAL NOTE  Patient frustrated and confused, as he's been told multiple things concerning his aneurysm.  He states he was up all night worried about a new aneurysm. Discussed that I believe the confusion came from the MRI starting there was a RECURRENT aneurysm instead of residual. Dr.Deveshwar, IR neurology, felt the aneurysm was actually getting smaller. Stated it had nothing to do with the pt's current symptoms (all due to BPPV). Recommended further imaging outpatient, nothing to do currently.   He was also upset because someone told him we would contact his neurologist yesterday and this has not been done. I apologized for the confusion but noted it was a holiday and the offices were closed, additionally noted that Dr. Guerry Bruin input would not change inpatient management. The patient will attempt to call his neurologist tomorrow, however I continued to try to stress it would not affect his current inpatient treatment (as he is here for vertigo and continues to have significant dizziness with movement).  Stressed pt should try to continue working with vestibular PT, he notes he does not like this as he gets dizzy. Stated we have meclizine PRN that he can take prior to help with the dizziness. Pt voiced understanding.  Archie Patten, MD [facility] Family Medicine Resident  10/23/2016, 2:51 PM

## 2016-10-24 ENCOUNTER — Ambulatory Visit (HOSPITAL_COMMUNITY): Payer: Self-pay | Admitting: Psychiatry

## 2016-10-24 LAB — BASIC METABOLIC PANEL
Anion gap: 8 (ref 5–15)
BUN: 14 mg/dL (ref 6–20)
CALCIUM: 8.7 mg/dL — AB (ref 8.9–10.3)
CO2: 23 mmol/L (ref 22–32)
Chloride: 106 mmol/L (ref 101–111)
Creatinine, Ser: 0.79 mg/dL (ref 0.61–1.24)
GFR calc non Af Amer: >60 mL/min (ref >60)
Glucose, Bld: 107 mg/dL — ABNORMAL HIGH (ref 65–99)
Potassium: 3.9 mmol/L (ref 3.5–5.1)
SODIUM: 137 mmol/L (ref 135–145)

## 2016-10-24 LAB — GLUCOSE, CAPILLARY
GLUCOSE-CAPILLARY: 110 mg/dL — AB (ref 65–99)
Glucose-Capillary: 132 mg/dL — ABNORMAL HIGH (ref 65–99)

## 2016-10-24 MED ORDER — MECLIZINE HCL 32 MG PO TABS: 32.0000 mg | ORAL_TABLET | Freq: Three times a day (TID) | ORAL | 0 refills | Status: DC | PRN

## 2016-10-24 NOTE — Discharge Summary (Signed)
[facility] Discharge Summary  Patient name: [name] Medical record number: [id number] Date of birth: 1956/01/30 Age: 61 y.o. 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 Extensive GI workup completed at prior hospitalization. Vestibular PT exam consistent with BPPV at that time. Patient endorses frequent vomiting at home, worsened by moving. On initial presentation, lactic acid elevated to 2.45 likely in the setting of dehydration, along with AKI to 1.12. Both of these resolved with hydration. MRI/MRA head ordered to work up any central cause of vomiting, stable aneurysm with slightly reduced size found. Dr. Estanislado Pandy with neuro IR was consulted as MRA initially read as recurrent aneurysm, but he felt this was smaller in size and unrelated to vomiting. Patient was able to take PO and move around his room without vomiting x 2 days prior to discharge.   Issues for Follow Up:  1. Consider referral to ENT if persistent BPPV.  2. Please refer patient to continued outpatient vestibular rehab PT.  3. May need additional titration of psych medications - depressed mood.   Significant Procedures: MRA  head  Significant Labs and Imaging:   Recent Labs Lab 10/20/16 1308 10/21/16 0453 10/22/16 0442  WBC 13.6* 8.8 10.0  HGB 15.6 12.8* 13.3  HCT 45.3 39.8 40.3  PLT 398 330 319    Recent  Labs Lab 10/20/16 1308 10/20/16 2030 10/21/16 0453 10/22/16 0442 10/24/16 0523  NA 137  --  139 137 137  K 3.1*  --  3.2* 3.3* 3.9  CL 101  --  106 104 106  CO2 24  --  26 26 23   GLUCOSE 171*  --  100* 100* 107*  BUN 14  --  7 <5* 14  CREATININE 1.12  --  0.83 0.84 0.79  CALCIUM 9.0  --  7.8* 8.2* 8.7*  MG  --  2.0  --   --   --   ALKPHOS 101  --   --   --   --   AST 30  --   --   --   --   ALT 37  --   --   --   --   ALBUMIN 4.2  --   --   --   --    Mr Jodene Nam Head Wo Contrast  Result Date: 10/22/2016 CLINICAL DATA:  Vertigo, intractable nausea and vomiting. History of stroke, diabetes, migraines. Status post RIGHT pericallosal pipeline stent. EXAM: MRI HEAD WITHOUT AND WITH CONTRAST MRA HEAD WITHOUT CONTRAST TECHNIQUE: Multiplanar, multiecho pulse sequences of the brain and surrounding structures were obtained without and with intravenous contrast. Angiographic images of the head were obtained using MRA technique without contrast. CONTRAST:  20 cc MultiHance COMPARISON:  MRI of the head June 15, 2016 FINDINGS: MRI HEAD FINDINGS- sequences vary from mildly to moderately motion degraded. INTRACRANIAL CONTENTS: No reduced diffusion to suggest acute ischemia. No susceptibility artifact to suggest hemorrhage. Faint chronic mineralization LEFT basal ganglia. The ventricles and sulci are normal for patient's age. No  suspicious parenchymal signal, masses, mass effect. Similar scattered supratentorial and patchy pontine white matter FLAIR T2 hyperintensities. Prominent basal ganglia perivascular spaces associated with chronic small vessel ischemic disease No abnormal intraparenchymal or extra-axial enhancement. No abnormal extra-axial fluid collections. No extra-axial masses. VASCULAR: Normal major intracranial vascular flow voids present at skull base. Susceptibility artifact along ACA vessels corresponding known pipeline stent dura border. SKULL AND UPPER CERVICAL SPINE: No abnormal sellar  expansion. No suspicious calvarial bone marrow signal. Craniocervical junction maintained. SINUSES/ORBITS: Bilateral maxillary mucosal retention cyst. Chronic ethmoid mucosal thickening. Mastoid air cells are well aerated.The included ocular globes and orbital contents are non-suspicious. OTHER: None. MRA HEAD FINDINGS ANTERIOR CIRCULATION: Normal flow related enhancement of the included cervical, petrous, cavernous and supraclinoid internal carotid arteries. Patent anterior communicating artery. Normal flow related enhancement of the anterior and middle cerebral arteries, including distal segments. Superiorly directed bilobed 5 x 3 mm aneurysm at site of prior 8 x 7 mm aneurysm RIGHT approximate A2-3 junction status post RIGHT ACA pipeline stent diverter. No large vessel occlusion, high-grade stenosis, abnormal luminal irregularity. POSTERIOR CIRCULATION: Codominant vertebral artery's. Basilar artery is patent, with normal flow related enhancement of the main branch vessels. Normal flow related enhancement of the posterior cerebral arteries. Robust RIGHT posterior communicating artery present. No large vessel occlusion, high-grade stenosis, abnormal luminal irregularity, aneurysm. ANATOMIC VARIANTS: None. Source images and MIP images were reviewed. IMPRESSION: MRI HEAD: No acute intracranial process on this motion degraded examination. Stable examination including mild to moderate chronic small vessel ischemic disease. 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 No emergent large vessel occlusion or severe stenosis. Electronically Signed   By: Elon Alas M.D.   On: 10/22/2016 03:37   Mr Jeri Cos BB Contrast  Result Date: 10/22/2016 CLINICAL DATA:  Vertigo, intractable nausea and vomiting. History of stroke, diabetes, migraines. Status post RIGHT pericallosal pipeline stent. EXAM: MRI HEAD WITHOUT AND WITH CONTRAST MRA HEAD WITHOUT CONTRAST TECHNIQUE:  Multiplanar, multiecho pulse sequences of the brain and surrounding structures were obtained without and with intravenous contrast. Angiographic images of the head were obtained using MRA technique without contrast. CONTRAST:  20 cc MultiHance COMPARISON:  MRI of the head June 15, 2016 FINDINGS: MRI HEAD FINDINGS- sequences vary from mildly to moderately motion degraded. INTRACRANIAL CONTENTS: No reduced diffusion to suggest acute ischemia. No susceptibility artifact to suggest hemorrhage. Faint chronic mineralization LEFT basal ganglia. The ventricles and sulci are normal for patient's age. No suspicious parenchymal signal, masses, mass effect. Similar scattered supratentorial and patchy pontine white matter FLAIR T2 hyperintensities. Prominent basal ganglia perivascular spaces associated with chronic small vessel ischemic disease No abnormal intraparenchymal or extra-axial enhancement. No abnormal extra-axial fluid collections. No extra-axial masses. VASCULAR: Normal major intracranial vascular flow voids present at skull base. Susceptibility artifact along ACA vessels corresponding known pipeline stent dura border. SKULL AND UPPER CERVICAL SPINE: No abnormal sellar expansion. No suspicious calvarial bone marrow signal. Craniocervical junction maintained. SINUSES/ORBITS: Bilateral maxillary mucosal retention cyst. Chronic ethmoid mucosal thickening. Mastoid air cells are well aerated.The included ocular globes and orbital contents are non-suspicious. OTHER: None. MRA HEAD FINDINGS ANTERIOR CIRCULATION: Normal flow related enhancement of the included cervical, petrous, cavernous and supraclinoid internal carotid arteries. Patent anterior communicating artery. Normal flow related enhancement of the anterior and middle cerebral arteries, including distal segments. Superiorly directed bilobed 5 x 3 mm aneurysm at site of prior 8 x 7 mm aneurysm RIGHT approximate A2-3 junction status post RIGHT  ACA pipeline stent  diverter. No large vessel occlusion, high-grade stenosis, abnormal luminal irregularity. POSTERIOR CIRCULATION: Codominant vertebral artery's. Basilar artery is patent, with normal flow related enhancement of the main branch vessels. Normal flow related enhancement of the posterior cerebral arteries. Robust RIGHT posterior communicating artery present. No large vessel occlusion, high-grade stenosis, abnormal luminal irregularity, aneurysm. ANATOMIC VARIANTS: None. Source images and MIP images were reviewed. IMPRESSION: MRI HEAD: No acute intracranial process on this motion degraded examination. Stable examination including mild to moderate chronic small vessel ischemic disease. 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 No emergent large vessel occlusion or severe stenosis. Electronically Signed   By: Elon Alas M.D.   On: 10/22/2016 03:37   Nm Gastric Emptying  Result Date: 10/04/2016 CLINICAL DATA:  Nausea and vomiting for 2 weeks EXAM: NUCLEAR MEDICINE GASTRIC EMPTYING SCAN TECHNIQUE: After oral ingestion of radiolabeled meal, sequential abdominal images were obtained for 4 hours. Percentage of activity emptying the stomach was calculated at 1 hour, 2 hour, 3 hour, and 4 hours. RADIOPHARMACEUTICALS:  2 mCi Tc-63m sulfur colloid in standardized meal COMPARISON:  None. FINDINGS: Expected location of the stomach in the left upper quadrant. Ingested meal empties the stomach gradually over the course of the study. 1% emptied at 1 hr ( normal >= 10%) 28% emptied at 2 hr ( normal >= 40%) 26% emptied at 3 hr ( normal >= 70%) 90% emptied at 4 hr ( normal >= 90%) IMPRESSION: Slow initial gastric emptying though the patient did demonstrate normal gastric emptying by the conclusion of the exam at 4 hours. Electronically Signed   By: Lavonia Dana M.D.   On: 10/04/2016 15:22   Ct Abdomen Pelvis W Contrast  Result Date: 10/20/2016 CLINICAL DATA:  One month  history of nausea. EXAM: CT ABDOMEN AND PELVIS WITH CONTRAST TECHNIQUE: Multidetector CT imaging of the abdomen and pelvis was performed using the standard protocol following bolus administration of intravenous contrast. CONTRAST:  125mL ISOVUE-300 IOPAMIDOL (ISOVUE-300) INJECTION 61% COMPARISON:  None. FINDINGS: Lower chest: The lung bases are clear of acute process. No pleural effusion or pulmonary lesions. The heart is normal in size. No pericardial effusion. The distal esophagus and aorta are unremarkable. Hepatobiliary: No focal hepatic lesions or intrahepatic biliary dilatation. The gallbladder appears normal. No common bile duct dilatation. Pancreas: No mass, inflammation or ductal dilatation. Spleen: Normal size.  No focal lesions. Adrenals/Urinary Tract: The adrenal glands are normal. The kidneys demonstrate normal enhancement/ perfusion. No worrisome renal lesions. No collecting system abnormalities on the delayed images. The ureters appear normal. Multiple bladder calculi are noted. Stomach/Bowel: The stomach, duodenum, small bowel and colon are grossly normal without oral contrast. No inflammatory changes, mass lesions or obstructive findings. The terminal ileum and appendix are normal. Scattered colonic diverticulosis without findings for acute diverticulitis. Vascular/Lymphatic: The aorta is normal in caliber. No dissection. The branch vessels are patent. The major venous structures are patent. No mesenteric or retroperitoneal mass or adenopathy. Small scattered lymph nodes are noted. Reproductive: The prostate gland is mildly enlarged. The seminal vesicles appear normal. Other: Small right inguinal hernia containing fat. There is also a small periumbilical abdominal wall hernia containing fat. Slightly more superiorly there is a small anterior abdominal wall hernia containing fat but no incarceration. The third hernia is noted more superiorly at the level of the stomach. This contains fat and vessels.  No incarceration. Musculoskeletal: No significant bony findings. IMPRESSION: 1. No acute abdominal findings,  mass lesions or adenopathy. 2. Multiple bladder calculi. 3. Colonic diverticulosis without findings for acute diverticulitis. 4. Multiple small anterior abdominal wall hernias containing fat. There is also a right inguinal hernia. Electronically Signed   By: Marijo Sanes M.D.   On: 10/20/2016 15:55   Dg Esophagus  Result Date: 10/03/2016 CLINICAL DATA:  Difficulty swallowing. EXAM: ESOPHOGRAM/BARIUM SWALLOW TECHNIQUE: Single contrast examination was performed using  thin barium. FLUOROSCOPY TIME:  Fluoroscopy Time:  2 minutes and 6 seconds. Radiation Exposure Index (if provided by the fluoroscopic device): 18.5 mGy Number of Acquired Spot Images: 0 COMPARISON:  None. FINDINGS: Study was limited by patient immobility. Patient had EGD earlier today and appeared somewhat lethargic at the time of exam. Oblique views of the esophagus shows no diverticulum, gross ulceration, or mass lesion. Mild Schatzki's ring evident. Patient was placed supine LPO relative to the fluoro table and monitored fluoroscopically while swallowing. This demonstrates disruption of primary peristalsis on multiple swallows with some mild tertiary contractions but no overt presbyesophagus. A 13 mm barium tablet did pass into the stomach when taken with water. IMPRESSION: 1. Nonspecific esophageal motility disorder. At one point during the exam with stasis of barium in the esophagus, the patient reported that this was similar to the symptoms he was having at home. 2. Mild Schatzki ring without obstruction to passage of a 13 mm barium tablet. Electronically Signed   By: Misty Stanley M.D.   On: 10/03/2016 16:03   Dg Abd 2 Views  Result Date: 10/01/2016 CLINICAL DATA:  Vomiting.  Abdominal pain. EXAM: ABDOMEN - 2 VIEW COMPARISON:  None. FINDINGS: The bowel gas pattern is normal. There is no evidence of free air. No radio-opaque calculi  or other significant radiographic abnormality is seen. IMPRESSION: Negative. Electronically Signed   By: Dorise Bullion III M.D   On: 10/01/2016 21:33   US Abdomen Limited Ruq  Result Date: 10/04/2016 CLINICAL DATA:  Nausea vomiting for 2 weeks. EXAM: US ABDOMEN LIMITED - RIGHT UPPER QUADRANT COMPARISON:  None. FINDINGS: Gallbladder: No gallstones or wall thickening visualized. No sonographic Murphy sign noted by sonographer. Common bile duct: Diameter: 7.1 mm.  No intraductal calculus is seen. Liver: Heterogeneously increased echogenicity. IMPRESSION: No evidence of cholelithiasis. Borderline increased in size extrahepatic common bile duct measuring 7.1 mm. Diffusely increased heterogeneous echogenicity of the liver, usually associated with intrinsic liver disease. Electronically Signed   By: Fidela Salisbury M.D.   On: 10/04/2016 07:29    Lactic acid 2.45 > 1.2  Results/Tests Pending at Time of Discharge: none  Discharge Medications:  Allergies as of 10/24/2016   No Known Allergies     Medication List    STOP taking these medications   temazepam 15 MG capsule Commonly known as:  RESTORIL     TAKE these medications   amLODipine 10 MG tablet Commonly known as:  NORVASC Take 1 tablet (10 mg total) by mouth daily.   aspirin EC 81 MG tablet Take 81 mg by mouth daily.   atorvastatin 40 MG tablet Commonly known as:  LIPITOR Take 1 tablet (40 mg total) by mouth daily at 6 PM.   clopidogrel 75 MG tablet Commonly known as:  PLAVIX Take 1 tablet (75 mg total) by mouth daily.   FLUoxetine 20 MG capsule Commonly known as:  PROZAC Take 1 capsule (20 mg total) by mouth daily. Take with 40 mg for a total of 60 mg   gabapentin 300 MG capsule Commonly known as:  NEURONTIN Take 300 mg  by mouth at bedtime.   glipiZIDE 10 MG tablet Commonly known as:  GLUCOTROL Take 10 mg by mouth daily before breakfast.   meclizine 32 MG tablet Commonly known as:  ANTIVERT Take 1 tablet (32 mg  total) by mouth 3 (three) times daily as needed.   metoprolol tartrate 50 MG tablet Commonly known as:  LOPRESSOR Take 1 tablet (50 mg total) by mouth 2 (two) times daily.   pantoprazole 40 MG tablet Commonly known as:  PROTONIX Take 1 tablet (40 mg total) by mouth daily.       Discharge Instructions: Please refer to Patient Instructions section of EMR for full details.  Patient was counseled important signs and symptoms that should prompt return to medical care, changes in medications, dietary instructions, activity restrictions, and follow up appointments.   Follow-Up Appointments: Patient instructed to follow up with PCP within 1 week.   Sela Hilding, MD 10/25/2016, 9:40 AM PGY-1, [place]

## 2016-10-24 NOTE — Progress Notes (Signed)
Family Medicine Teaching Service Daily Progress Note Intern Pager: [phone]  Patient name: [name] Medical record number: [id number] Date of birth: 03-31-1956 Age: 61 y.o. Gender: male  Primary Care Provider: Berkley Harvey, NP Consultants: None Code Status: FULL  Assessment and Plan: 61 y.o. male presenting with  intractable nausea and vomiting. PMH is significant for T2DM, CVA, history of severe depression, history of middle cerebral aneurysm, HTN, history of migraine headaches, HLD, vertigo, Hx migraine.  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 Patient with a pipeline stent for a pericallosal ACA aneurysm, multiple embolic strokes noted on previous MRIs. MRI with no acute process, stable ischemic disease.  - Plavix  - ASA 81 mg  - Lipitor 40mg   - Neurointerventional Radiology consulted, appreciate recommendations:   Intractable Nausea/Vomiting, improved: UDS in 09/2016 negative. 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 - Will add back meclizine 25 TID if needed in the future, patient feels this helped - Follow up with ENT as outpatient  Hypokalemia. K 3.2>3.3 > not obtained today. Magnesium 2.  - will get BMET 5/29  HTN:BP stable and controlled - Continue Lopressor - Currently holding Norvasc due to normotension. Add back if indicated.  FEN/GI: Advance diet as tolerated  Prophylaxis: Lovenox   Disposition: pending evaluation  Subjective:  Patient still upset about confusion regarding aneursym. He is amenable to discharge today. Notes that he has not had any vomiting x 2 days.   Objective: Temp:  [98.1 F (36.7 C)-98.8 F (37.1 C)] 98.1 F (36.7 C) (05/29 0542) Pulse Rate:  [66-68] 67 (05/29 0542) Resp:  [18-19] 19 (05/29 0542) BP: (122-139)/(69-78) 139/69 (05/29 0542) SpO2:  [97 %-99 %] 99  % (05/29 0542) Physical Exam: GEN: NAD, no apparent distress HEENT: EOMI, no nystagmus noted.  CV: RRR, no murmurs, rubs, or gallops PULM: CTAB, normal effort ABD: Soft, nontender, nondistended EXTR: No lower extremity edema or calf tenderness PSYCH: flat affect  Laboratory:  Recent Labs Lab 10/20/16 1308 10/21/16 0453 10/22/16 0442  WBC 13.6* 8.8 10.0  HGB 15.6 12.8* 13.3  HCT 45.3 39.8 40.3  PLT 398 330 319    Recent Labs Lab 10/20/16 1308 10/21/16 0453 10/22/16 0442 10/24/16 0523  NA 137 139 137 137  K 3.1* 3.2* 3.3* 3.9  CL 101 106 104 106  CO2 24 26 26 23   BUN 14 7 <5* 14  CREATININE 1.12 0.83 0.84 0.79  CALCIUM 9.0 7.8* 8.2* 8.7*  PROT 7.8  --   --   --   BILITOT 1.0  --   --   --   ALKPHOS 101  --   --   --   ALT 37  --   --   --   AST 30  --   --   --   GLUCOSE 171* 100* 100* 107*   LA 2.45>1.2  Mr Jodene Nam Head Wo Contrast  Result Date: 10/22/2016 CLINICAL DATA:  Vertigo, intractable nausea and vomiting. History of stroke, diabetes, migraines. Status post RIGHT pericallosal pipeline stent. EXAM: MRI HEAD WITHOUT AND WITH CONTRAST MRA HEAD WITHOUT CONTRAST TECHNIQUE: Multiplanar, multiecho pulse sequences of the brain and surrounding structures were obtained without and with intravenous contrast. Angiographic images of the head were obtained using MRA technique without contrast. CONTRAST:  20 cc MultiHance COMPARISON:  MRI of the head June 15, 2016 FINDINGS: MRI HEAD  FINDINGS- sequences vary from mildly to moderately motion degraded. INTRACRANIAL CONTENTS: No reduced diffusion to suggest acute ischemia. No susceptibility artifact to suggest hemorrhage. Faint chronic mineralization LEFT basal ganglia. The ventricles and sulci are normal for patient's age. No suspicious parenchymal signal, masses, mass effect. Similar scattered supratentorial and patchy pontine white matter FLAIR T2 hyperintensities. Prominent basal ganglia perivascular spaces associated with  chronic small vessel ischemic disease No abnormal intraparenchymal or extra-axial enhancement. No abnormal extra-axial fluid collections. No extra-axial masses. VASCULAR: Normal major intracranial vascular flow voids present at skull base. Susceptibility artifact along ACA vessels corresponding known pipeline stent dura border. SKULL AND UPPER CERVICAL SPINE: No abnormal sellar expansion. No suspicious calvarial bone marrow signal. Craniocervical junction maintained. SINUSES/ORBITS: Bilateral maxillary mucosal retention cyst. Chronic ethmoid mucosal thickening. Mastoid air cells are well aerated.The included ocular globes and orbital contents are non-suspicious. OTHER: None. MRA HEAD FINDINGS ANTERIOR CIRCULATION: Normal flow related enhancement of the included cervical, petrous, cavernous and supraclinoid internal carotid arteries. Patent anterior communicating artery. Normal flow related enhancement of the anterior and middle cerebral arteries, including distal segments. Superiorly directed bilobed 5 x 3 mm aneurysm at site of prior 8 x 7 mm aneurysm RIGHT approximate A2-3 junction status post RIGHT ACA pipeline stent diverter. No large vessel occlusion, high-grade stenosis, abnormal luminal irregularity. POSTERIOR CIRCULATION: Codominant vertebral artery's. Basilar artery is patent, with normal flow related enhancement of the main branch vessels. Normal flow related enhancement of the posterior cerebral arteries. Robust RIGHT posterior communicating artery present. No large vessel occlusion, high-grade stenosis, abnormal luminal irregularity, aneurysm. ANATOMIC VARIANTS: None. Source images and MIP images were reviewed. IMPRESSION: MRI HEAD: No acute intracranial process on this motion degraded examination. Stable examination including mild to moderate chronic small vessel ischemic disease. 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 No emergent large vessel  occlusion or severe stenosis. Electronically Signed   By: Elon Alas M.D.   On: 10/22/2016 03:37   Mr Jeri Cos EL Contrast  Result Date: 10/22/2016 CLINICAL DATA:  Vertigo, intractable nausea and vomiting. History of stroke, diabetes, migraines. Status post RIGHT pericallosal pipeline stent. EXAM: MRI HEAD WITHOUT AND WITH CONTRAST MRA HEAD WITHOUT CONTRAST TECHNIQUE: Multiplanar, multiecho pulse sequences of the brain and surrounding structures were obtained without and with intravenous contrast. Angiographic images of the head were obtained using MRA technique without contrast. CONTRAST:  20 cc MultiHance COMPARISON:  MRI of the head June 15, 2016 FINDINGS: MRI HEAD FINDINGS- sequences vary from mildly to moderately motion degraded. INTRACRANIAL CONTENTS: No reduced diffusion to suggest acute ischemia. No susceptibility artifact to suggest hemorrhage. Faint chronic mineralization LEFT basal ganglia. The ventricles and sulci are normal for patient's age. No suspicious parenchymal signal, masses, mass effect. Similar scattered supratentorial and patchy pontine white matter FLAIR T2 hyperintensities. Prominent basal ganglia perivascular spaces associated with chronic small vessel ischemic disease No abnormal intraparenchymal or extra-axial enhancement. No abnormal extra-axial fluid collections. No extra-axial masses. VASCULAR: Normal major intracranial vascular flow voids present at skull base. Susceptibility artifact along ACA vessels corresponding known pipeline stent dura border. SKULL AND UPPER CERVICAL SPINE: No abnormal sellar expansion. No suspicious calvarial bone marrow signal. Craniocervical junction maintained. SINUSES/ORBITS: Bilateral maxillary mucosal retention cyst. Chronic ethmoid mucosal thickening. Mastoid air cells are well aerated.The included ocular globes and orbital contents are non-suspicious. OTHER: None. MRA HEAD FINDINGS ANTERIOR CIRCULATION: Normal flow related enhancement of  the included cervical, petrous, cavernous and supraclinoid internal carotid arteries. Patent  anterior communicating artery. Normal flow related enhancement of the anterior and middle cerebral arteries, including distal segments. Superiorly directed bilobed 5 x 3 mm aneurysm at site of prior 8 x 7 mm aneurysm RIGHT approximate A2-3 junction status post RIGHT ACA pipeline stent diverter. No large vessel occlusion, high-grade stenosis, abnormal luminal irregularity. POSTERIOR CIRCULATION: Codominant vertebral artery's. Basilar artery is patent, with normal flow related enhancement of the main branch vessels. Normal flow related enhancement of the posterior cerebral arteries. Robust RIGHT posterior communicating artery present. No large vessel occlusion, high-grade stenosis, abnormal luminal irregularity, aneurysm. ANATOMIC VARIANTS: None. Source images and MIP images were reviewed. IMPRESSION: MRI HEAD: No acute intracranial process on this motion degraded examination. Stable examination including mild to moderate chronic small vessel ischemic disease. 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 No emergent large vessel occlusion or severe stenosis. Electronically Signed   By: Elon Alas M.D.   On: 10/22/2016 03:37   Nm Gastric Emptying  Result Date: 10/04/2016 CLINICAL DATA:  Nausea and vomiting for 2 weeks EXAM: NUCLEAR MEDICINE GASTRIC EMPTYING SCAN TECHNIQUE: After oral ingestion of radiolabeled meal, sequential abdominal images were obtained for 4 hours. Percentage of activity emptying the stomach was calculated at 1 hour, 2 hour, 3 hour, and 4 hours. RADIOPHARMACEUTICALS:  2 mCi Tc-22m sulfur colloid in standardized meal COMPARISON:  None. FINDINGS: Expected location of the stomach in the left upper quadrant. Ingested meal empties the stomach gradually over the course of the study. 1% emptied at 1 hr ( normal >= 10%) 28% emptied at 2 hr ( normal >=  40%) 26% emptied at 3 hr ( normal >= 70%) 90% emptied at 4 hr ( normal >= 90%) IMPRESSION: Slow initial gastric emptying though the patient did demonstrate normal gastric emptying by the conclusion of the exam at 4 hours. Electronically Signed   By: Lavonia Dana M.D.   On: 10/04/2016 15:22   Ct Abdomen Pelvis W Contrast  Result Date: 10/20/2016 CLINICAL DATA:  One month history of nausea. EXAM: CT ABDOMEN AND PELVIS WITH CONTRAST TECHNIQUE: Multidetector CT imaging of the abdomen and pelvis was performed using the standard protocol following bolus administration of intravenous contrast. CONTRAST:  146mL ISOVUE-300 IOPAMIDOL (ISOVUE-300) INJECTION 61% COMPARISON:  None. FINDINGS: Lower chest: The lung bases are clear of acute process. No pleural effusion or pulmonary lesions. The heart is normal in size. No pericardial effusion. The distal esophagus and aorta are unremarkable. Hepatobiliary: No focal hepatic lesions or intrahepatic biliary dilatation. The gallbladder appears normal. No common bile duct dilatation. Pancreas: No mass, inflammation or ductal dilatation. Spleen: Normal size.  No focal lesions. Adrenals/Urinary Tract: The adrenal glands are normal. The kidneys demonstrate normal enhancement/ perfusion. No worrisome renal lesions. No collecting system abnormalities on the delayed images. The ureters appear normal. Multiple bladder calculi are noted. Stomach/Bowel: The stomach, duodenum, small bowel and colon are grossly normal without oral contrast. No inflammatory changes, mass lesions or obstructive findings. The terminal ileum and appendix are normal. Scattered colonic diverticulosis without findings for acute diverticulitis. Vascular/Lymphatic: The aorta is normal in caliber. No dissection. The branch vessels are patent. The major venous structures are patent. No mesenteric or retroperitoneal mass or adenopathy. Small scattered lymph nodes are noted. Reproductive: The prostate gland is mildly  enlarged. The seminal vesicles appear normal. Other: Small right inguinal hernia containing fat. There is also a small periumbilical abdominal wall hernia containing fat. Slightly more superiorly there is a small  anterior abdominal wall hernia containing fat but no incarceration. The third hernia is noted more superiorly at the level of the stomach. This contains fat and vessels. No incarceration. Musculoskeletal: No significant bony findings. IMPRESSION: 1. No acute abdominal findings, mass lesions or adenopathy. 2. Multiple bladder calculi. 3. Colonic diverticulosis without findings for acute diverticulitis. 4. Multiple small anterior abdominal wall hernias containing fat. There is also a right inguinal hernia. Electronically Signed   By: Marijo Sanes M.D.   On: 10/20/2016 15:55   Dg Esophagus  Result Date: 10/03/2016 CLINICAL DATA:  Difficulty swallowing. EXAM: ESOPHOGRAM/BARIUM SWALLOW TECHNIQUE: Single contrast examination was performed using  thin barium. FLUOROSCOPY TIME:  Fluoroscopy Time:  2 minutes and 6 seconds. Radiation Exposure Index (if provided by the fluoroscopic device): 18.5 mGy Number of Acquired Spot Images: 0 COMPARISON:  None. FINDINGS: Study was limited by patient immobility. Patient had EGD earlier today and appeared somewhat lethargic at the time of exam. Oblique views of the esophagus shows no diverticulum, gross ulceration, or mass lesion. Mild Schatzki's ring evident. Patient was placed supine LPO relative to the fluoro table and monitored fluoroscopically while swallowing. This demonstrates disruption of primary peristalsis on multiple swallows with some mild tertiary contractions but no overt presbyesophagus. A 13 mm barium tablet did pass into the stomach when taken with water. IMPRESSION: 1. Nonspecific esophageal motility disorder. At one point during the exam with stasis of barium in the esophagus, the patient reported that this was similar to the symptoms he was having at  home. 2. Mild Schatzki ring without obstruction to passage of a 13 mm barium tablet. Electronically Signed   By: Misty Stanley M.D.   On: 10/03/2016 16:03   Dg Abd 2 Views  Result Date: 10/01/2016 CLINICAL DATA:  Vomiting.  Abdominal pain. EXAM: ABDOMEN - 2 VIEW COMPARISON:  None. FINDINGS: The bowel gas pattern is normal. There is no evidence of free air. No radio-opaque calculi or other significant radiographic abnormality is seen. IMPRESSION: Negative. Electronically Signed   By: Dorise Bullion III M.D   On: 10/01/2016 21:33   US Abdomen Limited Ruq  Result Date: 10/04/2016 CLINICAL DATA:  Nausea vomiting for 2 weeks. EXAM: US ABDOMEN LIMITED - RIGHT UPPER QUADRANT COMPARISON:  None. FINDINGS: Gallbladder: No gallstones or wall thickening visualized. No sonographic Murphy sign noted by sonographer. Common bile duct: Diameter: 7.1 mm.  No intraductal calculus is seen. Liver: Heterogeneously increased echogenicity. IMPRESSION: No evidence of cholelithiasis. Borderline increased in size extrahepatic common bile duct measuring 7.1 mm. Diffusely increased heterogeneous echogenicity of the liver, usually associated with intrinsic liver disease. Electronically Signed   By: Fidela Salisbury M.D.   On: 10/04/2016 07:29   Sela Hilding, MD 10/24/2016, 8:15 AM PGY-1, [facility] Intern pager: [phone], text pages welcome

## 2016-10-24 NOTE — Care Management Note (Signed)
Case Management Note  Patient Details  Name: [name] MRN: [id number] Date of Birth: 08/20/1955  Subjective/Objective:   Allen R Dixonis a 61 y.o.malepresenting with intractable vomiting with hypokalemia.   PTA, pt independent, lives alone.                  Action/Plan: Pt medically stable for discharge home today.  PT recommending HHPT for vestibular rehab.  Pt agreeable to [facility] set up.  Referral to [facility], intake ID [id number].  Pt has [employer] insurance--[facility] will arrange [facility] and call pt when [facility] provider is found.  Pt made aware of process for [facility] set up.    Expected Discharge Date:  10/24/16               Expected Discharge Plan:  [place]  In-House Referral:     Discharge planning Services  CM Consult  Post Acute Care Choice:  Home Health Choice offered to:  Patient  DME Arranged:    DME Agency:     HH Arranged:  PT HH Agency:     Status of Service:  Completed, signed off  If discussed at [place] of Stay Meetings, dates discussed:    Additional Comments:  Reinaldo Raddle, RN, BSN  Trauma/Neuro ICU Case Manager [phone]

## 2016-10-24 NOTE — Progress Notes (Signed)
Pt has orders to be discharged. Discharge instructions given and pt has no additional questions at this time. Medication regimen reviewed and pt educated. Pt verbalized understanding and has no additional questions. IV removed and site in good condition. Pt stable and waiting for transportation.  Clarise Cruz RN

## 2016-10-24 NOTE — Progress Notes (Signed)
PT Cancellation Note  Patient Details Name: [name] MRN: [id number] DOB: 04-03-1956   Cancelled Treatment:    Reason Eval/Treat Not Completed: Other (comment). Pt declined PT reporting "my nerves are shot. They told me I had an aneurysm and then said they read the reports wrong." "I'm leaving today and following up with my doctor in [place]. I just want to sit here, eat my lunch, and wait for my family to come get me. I'm hoping watching TV with calm my nerves." Pt educated on the importance of OOB. When pt asked how the dizziness was pt reported "I just don't move fast. " "I got up and took a shower and did fine." Acute PT to return as able.   Tadashi Burkel M Ailyne Pawley 10/24/2016, 1:00 PM   Kittie Plater, PT, DPT Pager #: [phone] Office #: [phone]

## 2016-10-25 ENCOUNTER — Ambulatory Visit (INDEPENDENT_AMBULATORY_CARE_PROVIDER_SITE_OTHER): Payer: Managed Care, Other (non HMO) | Admitting: *Deleted

## 2016-10-25 DIAGNOSIS — I639 Cerebral infarction, unspecified: Secondary | ICD-10-CM | POA: Diagnosis not present

## 2016-10-26 NOTE — Progress Notes (Signed)
[facility] Summary Report 

## 2016-10-28 LAB — CUP PACEART REMOTE DEVICE CHECK
Date Time Interrogation Session: 20180531003929
MDC IDC PG IMPLANT DT: 20170803

## 2016-10-28 NOTE — Progress Notes (Signed)
Carelink summary report received. Battery status OK. Normal device function. No new symptom episodes, tachy episodes, brady, or pause episodes. No new AF episodes. Monthly summary reports and ROV/PRN 

## 2016-10-30 NOTE — Addendum Note (Signed)
Addendum  created 10/30/16 1452 by Oleta Mouse, MD   Sign clinical note

## 2016-11-01 ENCOUNTER — Other Ambulatory Visit: Payer: Self-pay | Admitting: Family Medicine

## 2016-11-02 ENCOUNTER — Telehealth (HOSPITAL_COMMUNITY): Payer: Self-pay

## 2016-11-02 NOTE — Telephone Encounter (Signed)
Called to schedule consult, left message for pt to return call. AW 

## 2016-11-20 ENCOUNTER — Telehealth (HOSPITAL_COMMUNITY): Payer: Self-pay

## 2016-11-20 NOTE — Telephone Encounter (Signed)
Called to schedule angio, left message for pt to return call. AW 

## 2016-11-23 ENCOUNTER — Telehealth: Payer: Self-pay | Admitting: Cardiology

## 2016-11-23 NOTE — Telephone Encounter (Signed)
LMOVM requesting that pt send manual transmission b/c home monitor has not updated in at least 14 days.    

## 2016-11-24 ENCOUNTER — Ambulatory Visit (INDEPENDENT_AMBULATORY_CARE_PROVIDER_SITE_OTHER): Payer: Managed Care, Other (non HMO) | Admitting: *Deleted

## 2016-11-24 DIAGNOSIS — I639 Cerebral infarction, unspecified: Secondary | ICD-10-CM | POA: Diagnosis not present

## 2016-11-27 NOTE — Progress Notes (Signed)
Carelink Summary Report / Loop Recorder 

## 2016-12-04 LAB — CUP PACEART REMOTE DEVICE CHECK
Date Time Interrogation Session: 20180630004325
MDC IDC PG IMPLANT DT: 20170803

## 2016-12-25 ENCOUNTER — Ambulatory Visit (INDEPENDENT_AMBULATORY_CARE_PROVIDER_SITE_OTHER): Payer: Self-pay | Admitting: *Deleted

## 2016-12-25 DIAGNOSIS — I639 Cerebral infarction, unspecified: Secondary | ICD-10-CM

## 2016-12-25 NOTE — Progress Notes (Signed)
Carelink Summary Report / Loop Recorder 

## 2017-01-06 LAB — CUP PACEART REMOTE DEVICE CHECK
Date Time Interrogation Session: 20180730014014
Implantable Pulse Generator Implant Date: 20170803

## 2017-01-23 ENCOUNTER — Ambulatory Visit (INDEPENDENT_AMBULATORY_CARE_PROVIDER_SITE_OTHER): Payer: Self-pay | Admitting: *Deleted

## 2017-01-23 DIAGNOSIS — G459 Transient cerebral ischemic attack, unspecified: Secondary | ICD-10-CM

## 2017-01-24 LAB — CUP PACEART REMOTE DEVICE CHECK
Date Time Interrogation Session: 20180829022001
Implantable Pulse Generator Implant Date: 20170803

## 2017-01-24 NOTE — Progress Notes (Signed)
LOOP RECORDER SUMMARY REPORT

## 2017-02-09 ENCOUNTER — Ambulatory Visit (HOSPITAL_COMMUNITY): Payer: Self-pay | Admitting: Psychiatry

## 2017-02-22 ENCOUNTER — Ambulatory Visit (INDEPENDENT_AMBULATORY_CARE_PROVIDER_SITE_OTHER): Payer: Self-pay | Admitting: *Deleted

## 2017-02-22 DIAGNOSIS — I639 Cerebral infarction, unspecified: Secondary | ICD-10-CM

## 2017-02-23 LAB — CUP PACEART REMOTE DEVICE CHECK
Date Time Interrogation Session: 20180928023939
MDC IDC PG IMPLANT DT: 20170803

## 2017-02-23 NOTE — Progress Notes (Signed)
Carelink Summary Report / Loop Recorder 

## 2017-03-01 ENCOUNTER — Ambulatory Visit (HOSPITAL_COMMUNITY): Payer: Self-pay | Admitting: Psychiatry

## 2017-03-12 ENCOUNTER — Emergency Department (HOSPITAL_COMMUNITY)
Admission: EM | Admit: 2017-03-12 | Discharge: 2017-03-13 | Disposition: A | Discharge status: Home / Self Care | Payer: Medicaid Other | Attending: Emergency Medicine | Admitting: Emergency Medicine

## 2017-03-12 DIAGNOSIS — F329 Major depressive disorder, single episode, unspecified: Secondary | ICD-10-CM | POA: Diagnosis not present

## 2017-03-12 DIAGNOSIS — I10 Essential (primary) hypertension: Secondary | ICD-10-CM | POA: Insufficient documentation

## 2017-03-12 DIAGNOSIS — Z7984 Long term (current) use of oral hypoglycemic drugs: Secondary | ICD-10-CM | POA: Insufficient documentation

## 2017-03-12 DIAGNOSIS — F32A Depression, unspecified: Secondary | ICD-10-CM | POA: Diagnosis present

## 2017-03-12 DIAGNOSIS — Z7982 Long term (current) use of aspirin: Secondary | ICD-10-CM | POA: Insufficient documentation

## 2017-03-12 DIAGNOSIS — Z8679 Personal history of other diseases of the circulatory system: Secondary | ICD-10-CM | POA: Diagnosis not present

## 2017-03-12 DIAGNOSIS — E119 Type 2 diabetes mellitus without complications: Secondary | ICD-10-CM | POA: Insufficient documentation

## 2017-03-12 DIAGNOSIS — Z79899 Other long term (current) drug therapy: Secondary | ICD-10-CM | POA: Diagnosis not present

## 2017-03-12 DIAGNOSIS — Z7902 Long term (current) use of antithrombotics/antiplatelets: Secondary | ICD-10-CM | POA: Insufficient documentation

## 2017-03-12 DIAGNOSIS — F322 Major depressive disorder, single episode, severe without psychotic features: Secondary | ICD-10-CM

## 2017-03-12 DIAGNOSIS — F419 Anxiety disorder, unspecified: Secondary | ICD-10-CM | POA: Diagnosis present

## 2017-03-12 LAB — CBC WITH DIFFERENTIAL/PLATELET
BASOS PCT: 0 %
Basophils Absolute: 0 10*3/uL (ref 0.0–0.1)
EOS ABS: 0.1 10*3/uL (ref 0.0–0.7)
Eosinophils Relative: 1 %
HCT: 41.3 % (ref 39.0–52.0)
HEMOGLOBIN: 13.7 g/dL (ref 13.0–17.0)
Lymphocytes Relative: 13 %
Lymphs Abs: 1.7 10*3/uL (ref 0.7–4.0)
MCH: 28.7 pg (ref 26.0–34.0)
MCHC: 33.2 g/dL (ref 30.0–36.0)
MCV: 86.4 fL (ref 78.0–100.0)
MONOS PCT: 6 %
Monocytes Absolute: 0.7 10*3/uL (ref 0.1–1.0)
NEUTROS ABS: 10.3 10*3/uL — AB (ref 1.7–7.7)
NEUTROS PCT: 80 %
Platelets: 348 10*3/uL (ref 150–400)
RBC: 4.78 MIL/uL (ref 4.22–5.81)
RDW: 14 % (ref 11.5–15.5)
WBC: 12.8 10*3/uL — AB (ref 4.0–10.5)

## 2017-03-12 LAB — COMPREHENSIVE METABOLIC PANEL WITH GFR
ALT: 21 U/L (ref 17–63)
AST: 20 U/L (ref 15–41)
Albumin: 3.6 g/dL (ref 3.5–5.0)
Alkaline Phosphatase: 89 U/L (ref 38–126)
Anion gap: 8 (ref 5–15)
BUN: 12 mg/dL (ref 6–20)
CO2: 25 mmol/L (ref 22–32)
Calcium: 8.6 mg/dL — ABNORMAL LOW (ref 8.9–10.3)
Chloride: 105 mmol/L (ref 101–111)
Creatinine, Ser: 1.02 mg/dL (ref 0.61–1.24)
GFR calc Af Amer: >60 mL/min
GFR calc non Af Amer: >60 mL/min
Glucose, Bld: 166 mg/dL — ABNORMAL HIGH (ref 65–99)
Potassium: 3.8 mmol/L (ref 3.5–5.1)
Sodium: 138 mmol/L (ref 135–145)
Total Bilirubin: 0.6 mg/dL (ref 0.3–1.2)
Total Protein: 6.9 g/dL (ref 6.5–8.1)

## 2017-03-12 LAB — CBG MONITORING, ED: GLUCOSE-CAPILLARY: 75 mg/dL (ref 65–99)

## 2017-03-12 LAB — ETHANOL: Alcohol, Ethyl (B): <10 mg/dL (ref <10)

## 2017-03-12 MED ORDER — FLUOXETINE HCL 20 MG PO CAPS: 20.0000 mg | ORAL_CAPSULE | Freq: Every day | ORAL | Status: DC

## 2017-03-12 MED ORDER — GLIPIZIDE 10 MG PO TABS
10.0000 mg | ORAL_TABLET | Freq: Every day | ORAL | Status: DC
  Administered 2017-03-12 – 2017-03-13 (×2): 10 mg via ORAL
  Filled 2017-03-12 (×2): qty 1

## 2017-03-12 MED ORDER — LISINOPRIL 20 MG PO TABS: 40.0000 mg | ORAL_TABLET | Freq: Every day | ORAL | Status: DC

## 2017-03-12 MED ORDER — ATORVASTATIN CALCIUM 40 MG PO TABS: 40.0000 mg | ORAL_TABLET | Freq: Every day | ORAL | Status: DC

## 2017-03-12 MED ORDER — CLOPIDOGREL BISULFATE 75 MG PO TABS
75.0000 mg | ORAL_TABLET | Freq: Every day | ORAL | Status: DC
  Filled 2017-03-12: qty 1

## 2017-03-12 MED ORDER — METOPROLOL TARTRATE 25 MG PO TABS
50.0000 mg | ORAL_TABLET | Freq: Two times a day (BID) | ORAL | Status: DC
  Administered 2017-03-12: 50 mg via ORAL
  Filled 2017-03-12: qty 2

## 2017-03-12 MED ORDER — GABAPENTIN 300 MG PO CAPS
300.0000 mg | ORAL_CAPSULE | Freq: Every day | ORAL | Status: DC
  Administered 2017-03-13: 300 mg via ORAL
  Filled 2017-03-12: qty 1

## 2017-03-12 MED ORDER — AMLODIPINE BESYLATE 5 MG PO TABS
10.0000 mg | ORAL_TABLET | Freq: Every day | ORAL | Status: DC
  Administered 2017-03-12: 10 mg via ORAL
  Filled 2017-03-12: qty 2

## 2017-03-12 NOTE — ED Triage Notes (Signed)
Pt arrived via [facility] with a c/c of anxiety. Ptwas told to come to the ED by his PCM after recent bouts with depression. Pt has hx of a cerebral aneurysim (placed within the last years, but pt unable to give date). Pt states he lost his job in July due to his inability to get to work. Pt states he does not want to continue to live the way he is living, referring to his depression. Pt is alert and oriented x4.

## 2017-03-12 NOTE — ED Provider Notes (Addendum)
[facility] EMERGENCY DEPARTMENT Provider Note   CSN: [id number] Arrival date & time: 03/12/17  1802     History   Chief Complaint Chief Complaint  Patient presents with  . Anxiety  level V caveat, psychiatric complaint  HPI [name] is a 61 y.o. male.  HPIcomplains of depression since 2016 when he learned he had a cerebral aneurysm he reports for the past one week history depression has intensified to the point where he can't function at home. He cannot guarantee that he is safe at home and he cannot guarantee that he will not harm himself. He has no access to guns. He is depressed over his poor memory after cerebral aneurysm stenting and unemployment.  Past Medical History:  Diagnosis Date  . Allergy   . Anxiety   . Cataract   . Cerebral hemorrhage ([place])   . Chest pain, atypical    12/2003:  negative cardiolyte  . Depression   . Drug abuse   . Eczema   . Erectile dysfunction   . History of meniscal tear    bilateral  . HLD (hyperlipidemia)   . HTN (hypertension)   . Hx of tear of ACL (anterior cruciate ligament)    right  . Insomnia   . Olecranon bursitis of left elbow 10/2009   s/p I&D by Dr Maxie Better, initially assessed by Dr. Nori Riis   . Prediabetes   . Stroke ([place])   . TIA (transient ischemic attack)     Patient Active Problem List   Diagnosis Date Noted  . Family history of brain aneurysm   . Intractable vomiting 10/20/2016  . History of intracranial aneurysm   . Hypokalemia   . AKI (acute kidney injury) ([place])   . Prolonged QT interval   . Lactic acidosis   . Dysphagia   . Esophageal ring   . Gastroesophageal reflux disease with esophagitis   . Nausea and vomiting 10/01/2016  . 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 Severe episode of recurrent major depressive disorder, without psychotic features ([place]) 07/20/2016  . CVA (cerebral vascular accident) ([place]) 06/15/2016  . Abdominal pain   . Numbness   . Carpal tunnel syndrome, bilateral  12/16/2015  . Status post stroke 11/12/2015  . Transient neurologic deficit 10/15/2015  . Cerebrovascular accident, late effects 10/07/2015  . Complicated migraine   . Anxiety state   . 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 Cerebral infarction due to embolism of right middle cerebral artery ([place]) 09/23/2015  . Cerebral infarction due to embolism of cerebral artery ([place]) 09/23/2015  . Gait disturbance, post-stroke   . Drooping of mouth   . Cerebrovascular accident (CVA) due to embolism of right anterior cerebral artery ([place])   . Benign essential HTN   . Migraine with aura and without status migrainosus, not intractable   . Tachypnea   . Prediabetes   . Acute blood loss anemia   . Brain aneurysm 09/20/2015  . Type 2 diabetes mellitus ([place]) 09/15/2015  . Leukocytosis 09/15/2015  . Aneurysm, cerebral, nonruptured 09/15/2015  . TIA (transient ischemic attack) 09/14/2015  . Temporary cerebral vascular dysfunction 09/14/2015  . H/O transient cerebral ischemia 07/29/2015  . At risk for falling 07/29/2015  . Bilateral hearing loss 10/25/2014  . Abnormal fear 08/08/2012  . Headache, migraine 08/08/2012  . Arthritis, degenerative 08/08/2012  . History of tear of ACL (anterior cruciate ligament) 03/08/2011  . History of knee problem 03/08/2011  . Depression 09/07/2010  . Depressive disorder, not elsewhere classified 09/07/2010  .  URI (upper respiratory infection) 08/24/2010  . Hearing loss 09/06/2006  . Hyperlipidemia 04/23/2006  . Essential hypertension 04/23/2006  . Psychophysiological insomnia 04/23/2006    Past Surgical History:  Procedure Laterality Date  . EP IMPLANTABLE DEVICE N/A 12/30/2015   Procedure: Loop Recorder Insertion;  Surgeon: Thompson Grayer, MD;  Location: [facility];  Service: Cardiovascular;  Laterality: N/A;  . ESOPHAGOGASTRODUODENOSCOPY N/A 10/03/2016   Procedure: ESOPHAGOGASTRODUODENOSCOPY (EGD);  Surgeon: Irene Shipper, MD;  Location: [facility] ENDOSCOPY;   Service: Endoscopy;  Laterality: N/A;  . INCISE AND DRAIN ABCESS     L elbow due to cellulitis/bursitis  . INNER EAR SURGERY    . IR GENERIC HISTORICAL  12/28/2015   IR ANGIO VERTEBRAL SEL VERTEBRAL UNI L MOD SED 12/28/2015 Luanne Bras, MD [facility]  . IR GENERIC HISTORICAL  12/28/2015   IR ANGIO VERTEBRAL SEL SUBCLAVIAN INNOMINATE UNI R MOD SED 12/28/2015 Luanne Bras, MD [facility]  . IR GENERIC HISTORICAL  12/28/2015   IR ANGIO INTRA EXTRACRAN SEL INTERNAL CAROTID BILAT MOD SED 12/28/2015 Luanne Bras, MD [facility]  . KNEE ARTHROSCOPY    . RADIOLOGY WITH ANESTHESIA N/A 09/20/2015   Procedure: EMBOLIZATION         (RADIOLOGY WITH ANESTHESIA);  Surgeon: Luanne Bras, MD;  Location: [facility];  Service: Radiology;  Laterality: N/A;  . SKIN TAG REMOVAL     11 removed  . TEE WITHOUT CARDIOVERSION N/A 12/30/2015   Procedure: TRANSESOPHAGEAL ECHOCARDIOGRAM (TEE);  Surgeon: Larey Dresser, MD;  Location: [facility];  Service: Cardiovascular;  Laterality: N/A;       Home Medications    Prior to Admission medications   Medication Sig Start Date End Date Taking? Authorizing Provider  amLODipine (NORVASC) 10 MG tablet Take 1 tablet (10 mg total) by mouth daily. 12/31/15   Arrien, Jimmy Picket, MD  aspirin EC 81 MG tablet Take 81 mg by mouth daily.    [provider]  atorvastatin (LIPITOR) 40 MG tablet Take 1 tablet (40 mg total) by mouth daily at 6 PM. 12/31/15   Arrien, Jimmy Picket, MD  clopidogrel (PLAVIX) 75 MG tablet Take 1 tablet (75 mg total) by mouth daily. 09/30/15   Angiulli, Lavon Paganini, PA-C  FLUoxetine (PROZAC) 20 MG capsule Take 1 capsule (20 mg total) by mouth daily. Take with 40 mg for a total of 60 mg 09/06/16   Eksir, Allen Miu, MD  gabapentin (NEURONTIN) 300 MG capsule Take 300 mg by mouth at bedtime.    [provider]  glipiZIDE (GLUCOTROL) 10 MG tablet Take 10 mg by mouth daily before breakfast.    [provider]  meclizine  (ANTIVERT) 32 MG tablet Take 1 tablet (32 mg total) by mouth 3 (three) times daily as needed. 10/24/16   Sela Hilding, MD  metoprolol (LOPRESSOR) 50 MG tablet Take 1 tablet (50 mg total) by mouth 2 (two) times daily. 12/31/15   Arrien, Jimmy Picket, MD  pantoprazole (PROTONIX) 40 MG tablet Take 1 tablet (40 mg total) by mouth daily. 10/06/16   Sela Hilding, MD    Family History Family History  Problem Relation Age of Onset  . Stroke Mother   . Hypertension Mother   . Aneurysm Mother 24       Died of brain aneursym  . Heart failure Father   . Emphysema Father   . Diabetes Mellitus II Sister   . Colon cancer Neg Hx   . Rectal cancer Neg Hx   . Stomach  cancer Neg Hx     Social History Social History  Substance Use Topics  . Smoking status: Never Smoker  . Smokeless tobacco: Never Used  . Alcohol use No     Allergies   Patient has no known allergies.   Review of Systems Review of Systems  Unable to perform ROS: Psychiatric disorder  Psychiatric/Behavioral: Positive for dysphoric mood and suicidal ideas. The patient is nervous/anxious.      Physical Exam Updated Vital Signs BP (!) 158/86 (BP Location: Right Arm)   Pulse 100   Temp 97.8 F (36.6 C) (Oral)   Resp 16   SpO2 100% Comment: Simultaneous filing. User may not have seen previous data.  Physical Exam  Constitutional: He is oriented to person, place, and time. He appears well-developed and well-nourished.  HENT:  Head: Normocephalic and atraumatic.  Eyes: Pupils are equal, round, and reactive to light. Conjunctivae are normal.  Neck: Neck supple. No tracheal deviation present. No thyromegaly present.  Cardiovascular: Normal rate and regular rhythm.   No murmur heard. Pulmonary/Chest: Effort normal and breath sounds normal.  Abdominal: Soft. Bowel sounds are normal. He exhibits no distension. There is no tenderness.  obese  Musculoskeletal: Normal range of motion. He exhibits no edema or  tenderness.  Neurological: He is alert and oriented to person, place, and time. Coordination normal.  Skin: Skin is warm and dry. No rash noted.  Psychiatric:  Depressed affect  Nursing note and vitals reviewed.    ED Treatments / Results  Labs (all labs ordered are listed, but only abnormal results are displayed) Labs Reviewed  ETHANOL  RAPID URINE DRUG SCREEN, HOSP PERFORMED  CBC WITH DIFFERENTIAL/PLATELET  COMPREHENSIVE METABOLIC PANEL    EKG  EKG Interpretation  Date/Time:  Monday March 12 2017 20:16:21 EDT Ventricular Rate:  91 PR Interval:    QRS Duration: 96 QT Interval:  392 QTC Calculation: 483 R Axis:   56 Text Interpretation:  Sinus rhythm Consider left atrial enlargement Low voltage, precordial leads Borderline prolonged QT interval No significant change since last tracing Confirmed by Orlie Dakin [phone]) on 03/12/2017 9:20:32 PM      Results for orders placed or performed during the hospital encounter of 03/12/17  Ethanol  Result Value Ref Range   Alcohol, Ethyl (B) <10 <10 mg/dL  CBC with Diff  Result Value Ref Range   WBC 12.8 (H) 4.0 - 10.5 K/uL   RBC 4.78 4.22 - 5.81 MIL/uL   Hemoglobin 13.7 13.0 - 17.0 g/dL   HCT 41.3 39.0 - 52.0 %   MCV 86.4 78.0 - 100.0 fL   MCH 28.7 26.0 - 34.0 pg   MCHC 33.2 30.0 - 36.0 g/dL   RDW 14.0 11.5 - 15.5 %   Platelets 348 150 - 400 K/uL   Neutrophils Relative % 80 %   Neutro Abs 10.3 (H) 1.7 - 7.7 K/uL   Lymphocytes Relative 13 %   Lymphs Abs 1.7 0.7 - 4.0 K/uL   Monocytes Relative 6 %   Monocytes Absolute 0.7 0.1 - 1.0 K/uL   Eosinophils Relative 1 %   Eosinophils Absolute 0.1 0.0 - 0.7 K/uL   Basophils Relative 0 %   Basophils Absolute 0.0 0.0 - 0.1 K/uL  Comprehensive metabolic panel  Result Value Ref Range   Sodium 138 135 - 145 mmol/L   Potassium 3.8 3.5 - 5.1 mmol/L   Chloride 105 101 - 111 mmol/L   CO2 25 22 - 32 mmol/L   Glucose, Bld 166 (  H) 65 - 99 mg/dL   BUN 12 6 - 20 mg/dL    Creatinine, Ser 1.02 0.61 - 1.24 mg/dL   Calcium 8.6 (L) 8.9 - 10.3 mg/dL   Total Protein 6.9 6.5 - 8.1 g/dL   Albumin 3.6 3.5 - 5.0 g/dL   AST 20 15 - 41 U/L   ALT 21 17 - 63 U/L   Alkaline Phosphatase 89 38 - 126 U/L   Total Bilirubin 0.6 0.3 - 1.2 mg/dL   GFR calc non Af Amer >60 >60 mL/min   GFR calc Af Amer >60 >60 mL/min   Anion gap 8 5 - 15  CBG monitoring, ED  Result Value Ref Range   Glucose-Capillary 75 65 - 99 mg/dL   No results found.  Radiology No results found.  Procedures Procedures (including critical care time)  Medications Ordered in ED Medications - No data to display   Initial Impression / Assessment and Plan / ED Course  I have reviewed the triage vital signs and the nursing notes.  Pertinent labs & imaging results that were available during my care of the patient were reviewed by me and considered in my medical decision making (see chart for details).     Patient is medically cleared for psychiatric evaluation TTS consulted as the patient is currently not able to guarantee that he is safe at home and is at risk for suicide. Final Clinical Impressions(s) / ED Diagnoses  Diagnosis depression Final diagnoses:  None    New Prescriptions New Prescriptions   No medications on file     Orlie Dakin, MD 03/12/17 [phone]    Orlie Dakin, MD 03/12/17 [phone]

## 2017-03-12 NOTE — ED Notes (Signed)
Gave pt orange juice, per Dr. Winfred Leeds.
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
## 2017-03-12 NOTE — ED Notes (Signed)
MD would like patient in room, patient becoming more anxious in hallway

## 2017-03-12 NOTE — ED Notes (Signed)
TTS in progress 

## 2017-03-12 NOTE — BHH Counselor (Signed)
Per Patriciaann Clan, PA-C: Recommendation for continual observation for safety and stabilization.  Re-evaluation by psychiatry in the AM.   [facility], [place], MD, notified at [phone].

## 2017-03-13 LAB — CBG MONITORING, ED: Glucose-Capillary: 186 mg/dL — ABNORMAL HIGH (ref 65–99)

## 2017-03-13 NOTE — ED Provider Notes (Signed)
I received a phone call from behavioral health staff the patient was indeed anxious but did not meet inpatient criteria. Patient is stating he is not suicidal. Patient is requesting discharge. Still anxious. Was escorted to from the hospital security.  Patient left before I was able to prevent his discharge instruction he is belligerent and agitated and demanding them currently. These were taken to him. He is much more calm and is discharged with follow-up instructions   Tanna Furry, MD 03/13/17 [phone]

## 2017-03-13 NOTE — ED Notes (Addendum)
Per report, pt was wearing his personal clothing, had cell phone, and charger w/him. Pt changed into paper scrubs and placed all personal items incl cell phone and charger in belongings bag. Pt requested for this RN to apologize to previous RN for his behavior when he told him he was not giving up his cell phone. States "I was beligirent and didn't need to be. I'm sorry." Pt aware of need for urine specimen - cup placed on beside table.

## 2017-03-13 NOTE — ED Notes (Signed)
Pt in lobby requesting his d/c paperwork. Requested NT to ask Dr Jeneen Rinks for paperwork.

## 2017-03-13 NOTE — ED Notes (Signed)
Pt was given scrubs to put on. PT is willing to put on scrubs. Pt is not willing to give up phone due to having family members text him from out of state. Will notify morning nurse to determine what the plan is. Pt states that if he has to give up his phone he will just leave.

## 2017-03-13 NOTE — ED Notes (Signed)
Pt's belongings - 1 labeled belongings bag inventoried - Locker #2 - Valuables w/Security.
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
## 2017-03-13 NOTE — ED Notes (Signed)
Woke pt so TTS may be performed.

## 2017-03-13 NOTE — ED Notes (Signed)
Re-TTS being performed.  

## 2017-03-13 NOTE — ED Notes (Addendum)
Pt noted to be upset d/t stated he was advised by [facility] Counselor via TTS that he is being d/c'd. Pt yelling and cursing at nurse sating he wants his belongings. States "No one will help him and I'm going to leave right now". Advised pt his belongings were inventoried and that they would be given to him when RN receives his d/c paperwork as [facility] has not yet advised of disposition. Pt stated "They told me they were discharging me because I was honest and told them I'm not suicidal!"  - Pt got up from bed - stating "I'll just get them myself!" Pt ambulated to nurses' desk quickly - RN gave pt his belongings. Pt yelled at RN - stating "Why didn't you just give them to me when I asked for them!" Pt jerked bag and valuables envelope from RN. Pt then took belongings into his room and closed the door. Security standing by. Pt opened door to room - wearing his personal clothing and attempting to call his sister on his cell phone. Stated "That's all I wanted was my phone to call for a ride". Advised pt he could use the phone at nurses' desk d/t unable to get signal. RN called [facility] and verified tx plan is to d/c pt. Advised they would contact Dr Jeneen Rinks to notify. Pt initially stated he would wait for d/c paperwork. Pt asked Security x 2 - "Is that all you've got? You'd better get some more". Advised pt if he was going to continue to act inappropriately, he may leave and call his sister from lobby. Pt then called his sister asking if she would come get him from nurses' desk phone. Outpt resources given including [employer]. Pt refused to wait for d/c paperwork - Security escorted pt to lobby. Dr Jeneen Rinks aware pt left.

## 2017-03-13 NOTE — Progress Notes (Signed)
Per [name], the patient does not meet criteria for inpatient treatment. The patient is recommended for discharge and to follow up with an outpatient provider.   Patient is Psych Cleared.    The patient's assigned nurse, Eber Hong reports she provided additional resources for outpatient services.   Eber Hong, RN notified.    Radonna Ricker MSW, [place] Disposition [phone]

## 2017-03-13 NOTE — Discharge Instructions (Signed)
Please follow up with your current Mental Health provider. If you do not currently have one, you can go to [place] for mental health care. Number for [facility] is listed above.  Continue your current medications without change

## 2017-03-26 ENCOUNTER — Ambulatory Visit (INDEPENDENT_AMBULATORY_CARE_PROVIDER_SITE_OTHER): Payer: Self-pay | Admitting: *Deleted

## 2017-03-26 DIAGNOSIS — I639 Cerebral infarction, unspecified: Secondary | ICD-10-CM

## 2017-03-26 NOTE — Progress Notes (Signed)
Carelink Summary Report / Loop Recorder 

## 2017-03-29 LAB — CUP PACEART REMOTE DEVICE CHECK
Implantable Pulse Generator Implant Date: 20170803
MDC IDC SESS DTM: 20181028024153

## 2017-04-01 ENCOUNTER — Other Ambulatory Visit: Payer: Self-pay

## 2017-04-01 ENCOUNTER — Emergency Department (HOSPITAL_COMMUNITY)
Admission: EM | Admit: 2017-04-01 | Discharge: 2017-04-03 | Disposition: A | Discharge status: Home / Self Care | Payer: Medicaid Other | Attending: Emergency Medicine | Admitting: Emergency Medicine

## 2017-04-01 ENCOUNTER — Encounter (HOSPITAL_COMMUNITY): Payer: Self-pay | Admitting: Emergency Medicine

## 2017-04-01 DIAGNOSIS — Z7984 Long term (current) use of oral hypoglycemic drugs: Secondary | ICD-10-CM | POA: Diagnosis not present

## 2017-04-01 DIAGNOSIS — F332 Major depressive disorder, recurrent severe without psychotic features: Secondary | ICD-10-CM | POA: Insufficient documentation

## 2017-04-01 DIAGNOSIS — R450 Nervousness: Secondary | ICD-10-CM | POA: Diagnosis not present

## 2017-04-01 DIAGNOSIS — I10 Essential (primary) hypertension: Secondary | ICD-10-CM | POA: Insufficient documentation

## 2017-04-01 DIAGNOSIS — Z7902 Long term (current) use of antithrombotics/antiplatelets: Secondary | ICD-10-CM | POA: Diagnosis not present

## 2017-04-01 DIAGNOSIS — T50902A Poisoning by unspecified drugs, medicaments and biological substances, intentional self-harm, initial encounter: Secondary | ICD-10-CM | POA: Diagnosis present

## 2017-04-01 DIAGNOSIS — R45851 Suicidal ideations: Secondary | ICD-10-CM | POA: Insufficient documentation

## 2017-04-01 DIAGNOSIS — R4587 Impulsiveness: Secondary | ICD-10-CM | POA: Diagnosis not present

## 2017-04-01 DIAGNOSIS — Z8673 Personal history of transient ischemic attack (TIA), and cerebral infarction without residual deficits: Secondary | ICD-10-CM | POA: Diagnosis not present

## 2017-04-01 DIAGNOSIS — E119 Type 2 diabetes mellitus without complications: Secondary | ICD-10-CM | POA: Diagnosis not present

## 2017-04-01 DIAGNOSIS — Z049 Encounter for examination and observation for unspecified reason: Secondary | ICD-10-CM

## 2017-04-01 DIAGNOSIS — Z79899 Other long term (current) drug therapy: Secondary | ICD-10-CM | POA: Insufficient documentation

## 2017-04-01 DIAGNOSIS — F419 Anxiety disorder, unspecified: Secondary | ICD-10-CM | POA: Diagnosis not present

## 2017-04-01 LAB — COMPREHENSIVE METABOLIC PANEL
ALBUMIN: 3.5 g/dL (ref 3.5–5.0)
ALK PHOS: 93 U/L (ref 38–126)
ALT: 21 U/L (ref 17–63)
AST: 20 U/L (ref 15–41)
Anion gap: 10 (ref 5–15)
BILIRUBIN TOTAL: 0.7 mg/dL (ref 0.3–1.2)
BUN: 20 mg/dL (ref 6–20)
CALCIUM: 8.6 mg/dL — AB (ref 8.9–10.3)
CO2: 25 mmol/L (ref 22–32)
Chloride: 103 mmol/L (ref 101–111)
Creatinine, Ser: 0.93 mg/dL (ref 0.61–1.24)
GFR calc Af Amer: >60 mL/min (ref >60)
GFR calc non Af Amer: >60 mL/min (ref >60)
GLUCOSE: 166 mg/dL — AB (ref 65–99)
Potassium: 4.1 mmol/L (ref 3.5–5.1)
SODIUM: 138 mmol/L (ref 135–145)
TOTAL PROTEIN: 7.3 g/dL (ref 6.5–8.1)

## 2017-04-01 LAB — CBC
HEMATOCRIT: 42.8 % (ref 39.0–52.0)
HEMOGLOBIN: 14.2 g/dL (ref 13.0–17.0)
MCH: 29 pg (ref 26.0–34.0)
MCHC: 33.2 g/dL (ref 30.0–36.0)
MCV: 87.5 fL (ref 78.0–100.0)
Platelets: 386 10*3/uL (ref 150–400)
RBC: 4.89 MIL/uL (ref 4.22–5.81)
RDW: 13.4 % (ref 11.5–15.5)
WBC: 12.1 10*3/uL — AB (ref 4.0–10.5)

## 2017-04-01 LAB — CBG MONITORING, ED: Glucose-Capillary: 162 mg/dL — ABNORMAL HIGH (ref 65–99)

## 2017-04-01 LAB — SALICYLATE LEVEL: Salicylate Lvl: <7 mg/dL (ref 2.8–30.0)

## 2017-04-01 LAB — ETHANOL: Alcohol, Ethyl (B): <10 mg/dL (ref <10)

## 2017-04-01 LAB — ACETAMINOPHEN LEVEL

## 2017-04-01 NOTE — ED Notes (Signed)
Poison Control notified and advised to monitor for CNS depression and perform standard overdose labs. Pt to be monitored for 6 hours.

## 2017-04-01 NOTE — ED Triage Notes (Signed)
Pt coming from home with overdose of (8) 15mg  Risperdal. Pt is SI. Pt states he "doesn't want to be here anymore" and is "done with the world". Pt A&O x4 but very sleepy. Pt has brain aneurysm that is progressing. Pt has Hx of hypertension.  170/96 HR 62

## 2017-04-01 NOTE — ED Provider Notes (Signed)
[facility] DEPT Provider Note   CSN: [id number] Arrival date & time: 04/01/17  2217     History   Chief Complaint Chief Complaint  Patient presents with  . Drug Overdose  . Suicidal    HPI [name] is a 61 y.o. male who presents with drug overdose and suicidal ideation. PMH significant for CVA, cerebral hemorrhage, cerebral aneurysm, HTN, vertigo, diabetes, depression/anxiety. Patient was seen on 10/15 for depression and SI and it was determined he did not meet inpatient criteria and he was encouraged to f/u with [facility]. He states that since d/c he feels progressively worse. Tonight he took 8 Risperdal because he wanted "to sleep". He reports severe depressive symptoms due to his medical problems stating he has confusion all the time and doesn't like who he is anymore. He reports severe SI and nightmares and states that no one cares about him. He states all he has to say to psych is that he won't hurt himself and they will "throw him out". He called 911 tonight after taking the Risperdal because he got scared and asked them "if someone hurts themselves, do they still go to heaven?" I also spoke with his daughter [name] who told me he texted multiple family members today telling them that he loved them very much and then wouldn't reply to any of his texts back. He denies any physical symptoms. No headache, chest pain, SOB, abdominal pain, N/V. He states he didn't follow up with [facility] because he couldn't get an appointment (on review of EMR he told them he was feeling better).     HPI  Past Medical History:  Diagnosis Date  . Allergy   . Anxiety   . Cataract   . Cerebral hemorrhage ([place])   . Chest pain, atypical    12/2003:  negative cardiolyte  . Depression   . Drug abuse ([place])   . Eczema   . Erectile dysfunction   . History of meniscal tear    bilateral  . HLD (hyperlipidemia)   . HTN (hypertension)   . Hx of tear of ACL (anterior cruciate  ligament)    right  . Insomnia   . Olecranon bursitis of left elbow 10/2009   s/p I&D by Dr Maxie Better, initially assessed by Dr. Nori Riis   . Prediabetes   . Stroke ([place])   . TIA (transient ischemic attack)     Patient Active Problem List   Diagnosis Date Noted  . Family history of brain aneurysm   . Intractable vomiting 10/20/2016  . History of intracranial aneurysm   . Hypokalemia   . AKI (acute kidney injury) ([place])   . Prolonged QT interval   . Lactic acidosis   . Dysphagia   . Esophageal ring   . Gastroesophageal reflux disease with esophagitis   . Nausea and vomiting 10/01/2016  . 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 Severe episode of recurrent major depressive disorder, without psychotic features ([place]) 07/20/2016  . CVA (cerebral vascular accident) ([place]) 06/15/2016  . Abdominal pain   . Numbness   . Carpal tunnel syndrome, bilateral 12/16/2015  . Status post stroke 11/12/2015  . Transient neurologic deficit 10/15/2015  . Cerebrovascular accident, late effects 10/07/2015  . Complicated migraine   . Anxiety state   . 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 Cerebral infarction due to embolism of right middle cerebral artery ([place]) 09/23/2015  . Cerebral infarction due to embolism of cerebral artery ([place]) 09/23/2015  . Gait disturbance, post-stroke   .  Drooping of mouth   . Cerebrovascular accident (CVA) due to embolism of right anterior cerebral artery ([place])   . Benign essential HTN   . Migraine with aura and without status migrainosus, not intractable   . Tachypnea   . Prediabetes   . Acute blood loss anemia   . Brain aneurysm 09/20/2015  . Type 2 diabetes mellitus ([place]) 09/15/2015  . Leukocytosis 09/15/2015  . Aneurysm, cerebral, nonruptured 09/15/2015  . TIA (transient ischemic attack) 09/14/2015  . Temporary cerebral vascular dysfunction 09/14/2015  . H/O transient cerebral ischemia 07/29/2015  . At risk for falling 07/29/2015  . Bilateral hearing loss 10/25/2014  .  Abnormal fear 08/08/2012  . Headache, migraine 08/08/2012  . Arthritis, degenerative 08/08/2012  . History of tear of ACL (anterior cruciate ligament) 03/08/2011  . History of knee problem 03/08/2011  . Depression 09/07/2010  . Depressive disorder, not elsewhere classified 09/07/2010  . URI (upper respiratory infection) 08/24/2010  . Hearing loss 09/06/2006  . Hyperlipidemia 04/23/2006  . Essential hypertension 04/23/2006  . Psychophysiological insomnia 04/23/2006    Past Surgical History:  Procedure Laterality Date  . INCISE AND DRAIN ABCESS     L elbow due to cellulitis/bursitis  . INNER EAR SURGERY    . IR GENERIC HISTORICAL  12/28/2015   IR ANGIO VERTEBRAL SEL VERTEBRAL UNI L MOD SED 12/28/2015 Luanne Bras, MD [facility]  . IR GENERIC HISTORICAL  12/28/2015   IR ANGIO VERTEBRAL SEL SUBCLAVIAN INNOMINATE UNI R MOD SED 12/28/2015 Luanne Bras, MD [facility]  . IR GENERIC HISTORICAL  12/28/2015   IR ANGIO INTRA EXTRACRAN SEL INTERNAL CAROTID BILAT MOD SED 12/28/2015 Luanne Bras, MD [facility]  . KNEE ARTHROSCOPY    . SKIN TAG REMOVAL     11 removed       Home Medications    Prior to Admission medications   Medication Sig Start Date End Date Taking? Authorizing Provider  amLODipine (NORVASC) 10 MG tablet Take 1 tablet (10 mg total) by mouth daily. 12/31/15   Arrien, Jimmy Picket, MD  atorvastatin (LIPITOR) 40 MG tablet Take 1 tablet (40 mg total) by mouth daily at 6 PM. 12/31/15   Arrien, Jimmy Picket, MD  clopidogrel (PLAVIX) 75 MG tablet Take 1 tablet (75 mg total) by mouth daily. Patient not taking: Reported on 03/12/2017 09/30/15   Angiulli, Lavon Paganini, PA-C  FLUoxetine (PROZAC) 20 MG capsule Take 1 capsule (20 mg total) by mouth daily. Take with 40 mg for a total of 60 mg 09/06/16   Eksir, Richard Miu, MD  gabapentin (NEURONTIN) 300 MG capsule Take 300 mg by mouth at bedtime.    [provider]  glipiZIDE (GLUCOTROL) 10 MG tablet Take 10 mg by  mouth daily before breakfast.    [provider]  lisinopril (PRINIVIL,ZESTRIL) 40 MG tablet Take 40 mg by mouth daily. 12/28/16   [provider]  meclizine (ANTIVERT) 32 MG tablet Take 1 tablet (32 mg total) by mouth 3 (three) times daily as needed. Patient taking differently: Take 32 mg by mouth 3 (three) times daily as needed for dizziness.  10/24/16   Sela Hilding, MD  metoprolol (LOPRESSOR) 50 MG tablet Take 1 tablet (50 mg total) by mouth 2 (two) times daily. 12/31/15   Arrien, Jimmy Picket, MD  pantoprazole (PROTONIX) 40 MG tablet Take 1 tablet (40 mg total) by mouth daily. Patient taking differently: Take 40 mg by mouth daily as needed (for GERD).  10/06/16   Sela Hilding, MD  Family History Family History  Problem Relation Age of Onset  . Stroke Mother   . Hypertension Mother   . Aneurysm Mother 64       Died of brain aneursym  . Heart failure Father   . Emphysema Father   . Diabetes Mellitus II Sister   . Colon cancer Neg Hx   . Rectal cancer Neg Hx   . Stomach cancer Neg Hx     Social History Social History   Tobacco Use  . Smoking status: Never Smoker  . Smokeless tobacco: Never Used  Substance Use Topics  . Alcohol use: No    Alcohol/week: 0.0 oz  . Drug use: No     Allergies   Metformin   Review of Systems Review of Systems  Constitutional: Negative for fever.  Respiratory: Negative for shortness of breath.   Cardiovascular: Negative for chest pain.  Gastrointestinal: Negative for abdominal pain.  Neurological: Negative for headaches.  Psychiatric/Behavioral: Positive for dysphoric mood, self-injury and suicidal ideas. Negative for behavioral problems and hallucinations.     Physical Exam Updated Vital Signs There were no vitals taken for this visit.  Physical Exam  Constitutional: He is oriented to person, place, and time. He appears well-developed and well-nourished. No distress.  Sleepy but arouses to voice.  Obese  HENT:  Head: Normocephalic and atraumatic.  Eyes: Conjunctivae are normal. Pupils are equal, round, and reactive to light. Right eye exhibits no discharge. Left eye exhibits no discharge. No scleral icterus.  Neck: Normal range of motion.  Cardiovascular: Normal rate and regular rhythm. Exam reveals no gallop and no friction rub.  No murmur heard. Pulmonary/Chest: Effort normal and breath sounds normal. No stridor. No respiratory distress. He has no wheezes. He has no rales. He exhibits no tenderness.  Abdominal: Soft. Bowel sounds are normal. He exhibits no distension. There is no tenderness.  Neurological: He is alert and oriented to person, place, and time.  Skin: Skin is warm and dry.  Psychiatric: His speech is normal. Judgment normal. He is slowed. He is not actively hallucinating. Thought content is not paranoid and not delusional. Cognition and memory are normal. He exhibits a depressed mood. He expresses suicidal ideation. He expresses no homicidal ideation. He expresses suicidal plans. He expresses no homicidal plans.  Tearful  Nursing note and vitals reviewed.    ED Treatments / Results  Labs (all labs ordered are listed, but only abnormal results are displayed) Labs Reviewed  COMPREHENSIVE METABOLIC PANEL - Abnormal; Notable for the following components:      Result Value   Glucose, Bld 166 (*)    Calcium 8.6 (*)    All other components within normal limits  ACETAMINOPHEN LEVEL - Abnormal; Notable for the following components:   Acetaminophen (Tylenol), Serum <10 (*)    All other components within normal limits  CBC - Abnormal; Notable for the following components:   WBC 12.1 (*)    All other components within normal limits  RAPID URINE DRUG SCREEN, HOSP PERFORMED - Abnormal; Notable for the following components:   Benzodiazepines POSITIVE (*)    All other components within normal limits  CBG MONITORING, ED - Abnormal; Notable for the following components:    Glucose-Capillary 162 (*)    All other components within normal limits  ETHANOL  SALICYLATE LEVEL    EKG  EKG Interpretation None       Radiology No results found.  Procedures Procedures (including critical care time)  Medications Ordered in ED Medications  amLODipine (NORVASC) tablet 10 mg (not administered)  atorvastatin (LIPITOR) tablet 60 mg (not administered)  chlorthalidone (HYGROTON) tablet 25 mg (not administered)  FLUoxetine (PROZAC) capsule 20 mg (not administered)  gabapentin (NEURONTIN) capsule 300 mg (not administered)  glipiZIDE (GLUCOTROL XL) 24 hr tablet 10 mg (not administered)  indapamide (LOZOL) tablet 1.25 mg (not administered)  lisinopril (PRINIVIL,ZESTRIL) tablet 40 mg (not administered)  metoprolol tartrate (LOPRESSOR) tablet 50 mg (not administered)     Initial Impression / Assessment and Plan / ED Course  I have reviewed the triage vital signs and the nursing notes.  Pertinent labs & imaging results that were available during my care of the patient were reviewed by me and considered in my medical decision making (see chart for details).  61 year old male presents with drug overdose and SI. He is tachycardic and tachypneic at times but otherwise vitals are normal. He has no physical complaints. He does have severe depressive symptoms and I do not think he's being honest when he says he took the medicine tonight just to sleep. Will IVC and initiate psych work up. Poison control was called and they recommend monitoring for 6 hours and standard OD labs.   Labs are overall at baseline. He has been monitored for >6 hours. He is medically cleared for TTS consult  Final Clinical Impressions(s) / ED Diagnoses   Final diagnoses:  Intentional drug overdose, initial encounter [facility])  Suicidal ideation    ED Discharge Orders    None       Recardo Evangelist, PA-C 04/02/17 6701    Lajean Saver, MD 04/02/17 1046

## 2017-04-01 NOTE — ED Notes (Signed)
Bed: RESB Expected date:  Expected time:  Means of arrival:  Comments: Hold for overdose

## 2017-04-02 ENCOUNTER — Emergency Department (HOSPITAL_COMMUNITY): Payer: Medicaid Other

## 2017-04-02 LAB — RAPID URINE DRUG SCREEN, HOSP PERFORMED
AMPHETAMINES: NOT DETECTED
Amphetamines: NOT DETECTED
Barbiturates: NOT DETECTED
Barbiturates: NOT DETECTED
Benzodiazepines: POSITIVE — AB
Benzodiazepines: POSITIVE — AB
COCAINE: NOT DETECTED
COCAINE: NOT DETECTED
OPIATES: NOT DETECTED
OPIATES: NOT DETECTED
TETRAHYDROCANNABINOL: NOT DETECTED
Tetrahydrocannabinol: NOT DETECTED

## 2017-04-02 LAB — URINALYSIS, ROUTINE W REFLEX MICROSCOPIC
BILIRUBIN URINE: NEGATIVE
Glucose, UA: NEGATIVE mg/dL
Hgb urine dipstick: NEGATIVE
KETONES UR: NEGATIVE mg/dL
Nitrite: NEGATIVE
PROTEIN: NEGATIVE mg/dL
SQUAMOUS EPITHELIAL / LPF: NONE SEEN
Specific Gravity, Urine: 1.014 (ref 1.005–1.030)
pH: 7 (ref 5.0–8.0)

## 2017-04-02 MED ORDER — LISINOPRIL 20 MG PO TABS
40.0000 mg | ORAL_TABLET | Freq: Every day | ORAL | Status: DC
  Administered 2017-04-02 – 2017-04-03 (×2): 40 mg via ORAL
  Filled 2017-04-02 (×2): qty 2

## 2017-04-02 MED ORDER — INDAPAMIDE 1.25 MG PO TABS
1.2500 mg | ORAL_TABLET | Freq: Every day | ORAL | Status: DC
  Administered 2017-04-02 – 2017-04-03 (×2): 1.25 mg via ORAL
  Filled 2017-04-02 (×2): qty 1

## 2017-04-02 MED ORDER — CHLORTHALIDONE 25 MG PO TABS
25.0000 mg | ORAL_TABLET | Freq: Every day | ORAL | Status: DC
  Administered 2017-04-02 – 2017-04-03 (×2): 25 mg via ORAL
  Filled 2017-04-02 (×2): qty 1

## 2017-04-02 MED ORDER — GABAPENTIN 300 MG PO CAPS
300.0000 mg | ORAL_CAPSULE | Freq: Every day | ORAL | Status: DC
  Administered 2017-04-02: 300 mg via ORAL
  Filled 2017-04-02: qty 1

## 2017-04-02 MED ORDER — ATORVASTATIN CALCIUM 40 MG PO TABS
60.0000 mg | ORAL_TABLET | Freq: Every day | ORAL | Status: DC
  Administered 2017-04-02: 60 mg via ORAL
  Filled 2017-04-02 (×3): qty 1

## 2017-04-02 MED ORDER — METOPROLOL TARTRATE 25 MG PO TABS
50.0000 mg | ORAL_TABLET | Freq: Two times a day (BID) | ORAL | Status: DC
  Administered 2017-04-02 – 2017-04-03 (×3): 50 mg via ORAL
  Filled 2017-04-02 (×3): qty 2

## 2017-04-02 MED ORDER — FLUOXETINE HCL 20 MG PO CAPS
20.0000 mg | ORAL_CAPSULE | Freq: Every day | ORAL | Status: DC
  Administered 2017-04-02 – 2017-04-03 (×2): 20 mg via ORAL
  Filled 2017-04-02 (×2): qty 1

## 2017-04-02 MED ORDER — GLIPIZIDE ER 10 MG PO TB24
10.0000 mg | ORAL_TABLET | Freq: Every day | ORAL | Status: DC
  Administered 2017-04-02 – 2017-04-03 (×2): 10 mg via ORAL
  Filled 2017-04-02 (×3): qty 1

## 2017-04-02 MED ORDER — AMLODIPINE BESYLATE 5 MG PO TABS
10.0000 mg | ORAL_TABLET | Freq: Every day | ORAL | Status: DC
  Administered 2017-04-02 – 2017-04-03 (×2): 10 mg via ORAL
  Filled 2017-04-02 (×2): qty 2

## 2017-04-02 MED ORDER — ATORVASTATIN CALCIUM 40 MG PO TABS: 40.0000 mg | ORAL_TABLET | Freq: Every day | ORAL | Status: DC

## 2017-04-02 NOTE — BH Specialist Note (Signed)
04/02/2017: Per Waylan Boga, DNP, patient meets criteria for [facility] INPT treatment. Patient referred to the following facilities for INPT treatment: [facility], [facility], [facility], [facility], [place], [place] [facility].
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
## 2017-04-02 NOTE — ED Notes (Addendum)
Pt is calm and resting

## 2017-04-02 NOTE — ED Provider Notes (Signed)
61 year old male comes in after taking an overdose of some sleeping medications.  He does not sure what the medicine is but he states he took 6 pills.  He does not answer if I asked him about suicidal intent.  On exam, he is sleepy but arousable.  Vital signs are normal he is maintaining adequate oxygenation.  He will need to be observed in the ED, TTS consultation once adequate period of observation has elapsed.  Medical screening examination/treatment/procedure(s) were conducted as a shared visit with non-physician practitioner(s) and myself.  I personally evaluated the patient during the encounter.     Delora Fuel, MD 99/77/41 [phone]

## 2017-04-02 NOTE — ED Notes (Signed)
Pt cleared by Poison Control.

## 2017-04-02 NOTE — ED Notes (Signed)
Pt is awake and restless. Pt states " I can't stay here it feels like prision" provided emotional support and made pt aware that [facility] is looking for placement

## 2017-04-02 NOTE — ED Notes (Signed)
Pt presents agitated in TCU, elopement risk, tearful and anxious.  SI, OD on 8 [employer].  A&O x 3, no distress noted, calm & cooperative at present.  Monitoring for safety, Q 15 min checks in effect.  Safety check for contraband completed, no items found.  Snack given.

## 2017-04-03 DIAGNOSIS — R4587 Impulsiveness: Secondary | ICD-10-CM

## 2017-04-03 DIAGNOSIS — F332 Major depressive disorder, recurrent severe without psychotic features: Secondary | ICD-10-CM | POA: Diagnosis not present

## 2017-04-03 DIAGNOSIS — R450 Nervousness: Secondary | ICD-10-CM | POA: Diagnosis not present

## 2017-04-03 DIAGNOSIS — F419 Anxiety disorder, unspecified: Secondary | ICD-10-CM

## 2017-04-03 LAB — CBG MONITORING, ED: Glucose-Capillary: 147 mg/dL — ABNORMAL HIGH (ref 65–99)

## 2017-04-03 NOTE — BH Specialist Note (Signed)
Per Dr. Mariea Clonts and Jinny Blossom, NP, patient meets criteria for [facility]) INPT treatment. Patient referred to the following facilities for INPT treatment: [facility], [facility], [facility], [facility], [facility], [place].

## 2017-04-03 NOTE — Progress Notes (Signed)
04/03/17 1345:  LRT introduced self to pt and offered activities, pt declined.   Victorino Sparrow, LRT/CTRS

## 2017-04-03 NOTE — Progress Notes (Signed)
CSW spoke with patient via bedside regarding transportation concerns. Patient stated he has family that comes into town when he needs transportation. CSW explained using the bus route if needed ( cost, routes, etc.). Patient currently has $100 in belongings and is agreeable to using [employer] taxi services to get home once stable for discharge.   Kingsley Spittle, [facility] Clinical Social Worker [phone]

## 2017-04-03 NOTE — ED Notes (Signed)
Pt discharged safely with discharge instructions.  All belongings were returned.  Pt contracts for safety.  Pt verbalized understanding of all instructions.  [employer] picked up pt.

## 2017-04-03 NOTE — [facility] Suicide Risk Assessment (Signed)
Suicide Risk Assessment  Discharge Assessment   [facility] Discharge Suicide Risk Assessment   Principal Problem: Severe episode of recurrent major depressive disorder, without psychotic features [facility]) Discharge Diagnoses:  Patient Active Problem List   Diagnosis Date Noted  . Family history of brain aneurysm [Z82.49]   . Intractable vomiting [R11.10] 10/20/2016  . History of intracranial aneurysm [Z86.79]   . Hypokalemia [E87.6]   . AKI (acute kidney injury) ([place]) [N17.9]   . Prolonged QT interval [R94.31]   . Lactic acidosis [E87.2]   . Dysphagia [R13.10]   . Esophageal ring [K22.2]   . Gastroesophageal reflux disease with esophagitis [K21.0]   . Nausea and vomiting [R11.2] 10/01/2016  . 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 Severe episode of recurrent major depressive disorder, without psychotic features ([place]) [F33.2] 07/20/2016  . CVA (cerebral vascular accident) ([place]) [I63.9] 06/15/2016  . Abdominal pain [R10.9]   . Numbness [R20.0]   . Carpal tunnel syndrome, bilateral [G56.03] 12/16/2015  . Status post stroke [Z86.73] 11/12/2015  . Transient neurologic deficit [R29.818] 10/15/2015  . Cerebrovascular accident, late effects [I69.90] 10/07/2015  . Complicated migraine [Z60.109]   . Anxiety state [F41.1]   . 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 Cerebral infarction due to embolism of right middle cerebral artery ([place]) [I63.411] 09/23/2015  . Cerebral infarction due to embolism of cerebral artery ([place]) [I63.40] 09/23/2015  . Gait disturbance, post-stroke [I69.398, R26.9]   . Drooping of mouth [R29.810]   . Cerebrovascular accident (CVA) due to embolism of right anterior cerebral artery ([place]) [I63.421]   . Benign essential HTN [I10]   . Migraine with aura and without status migrainosus, not intractable [G43.109]   . Tachypnea [R06.82]   . Prediabetes [R73.03]   . Acute blood loss anemia [D62]   . Brain aneurysm [I67.1] 09/20/2015  . Type 2 diabetes mellitus ([place])  [E11.9] 09/15/2015  . Leukocytosis [D72.829] 09/15/2015  . Aneurysm, cerebral, nonruptured [I67.1] 09/15/2015  . TIA (transient ischemic attack) [G45.9] 09/14/2015  . Temporary cerebral vascular dysfunction [G93.9] 09/14/2015  . H/O transient cerebral ischemia [Z86.73] 07/29/2015  . At risk for falling [Z91.81] 07/29/2015  . Bilateral hearing loss [H91.93] 10/25/2014  . Abnormal fear [F40.9] 08/08/2012  . Headache, migraine [G43.909] 08/08/2012  . Arthritis, degenerative [M19.90] 08/08/2012  . History of tear of ACL (anterior cruciate ligament) [Z87.828] 03/08/2011  . History of knee problem [Z87.39] 03/08/2011  . Depression [F32.9] 09/07/2010  . Depressive disorder, not elsewhere classified [F32.9] 09/07/2010  . URI (upper respiratory infection) [J06.9] 08/24/2010  . Hearing loss [H91.90] 09/06/2006  . Hyperlipidemia [E78.5] 04/23/2006  . Essential hypertension [I10] 04/23/2006  . Psychophysiological insomnia [F51.04] 04/23/2006    Total Time spent with patient: 45 minutes  Musculoskeletal: Strength & Muscle Tone: within normal limits Gait & Station: normal Patient leans: N/A  Psychiatric Specialty Exam: Physical Exam  Constitutional: He is oriented to person, place, and time. He appears well-developed and well-nourished.  HENT:  Head: Normocephalic.  Respiratory: Effort normal.  Neurological: He is alert and oriented to person, place, and time.  Psychiatric: His speech is normal and behavior is normal. Thought content normal. His mood appears anxious. Cognition and memory are not impaired. He expresses impulsivity. He exhibits a depressed mood.   Review of Systems  Psychiatric/Behavioral: Positive for depression. Negative for hallucinations, memory loss, substance abuse and suicidal ideas. The patient is nervous/anxious. The patient does not have insomnia.   All other systems reviewed and are negative.  Blood pressure 126/68, pulse 70, temperature 99  F (37.2 C),  temperature source Oral, resp. rate 18, SpO2 97 %.There is no height or weight on file to calculate BMI. General Appearance: Casual Eye Contact:  Good Speech:  Clear and Coherent and Normal Rate Volume:  Normal Mood:  Anxious and Depressed Affect:  Congruent and Depressed Thought Process:  Coherent, Goal Directed and Linear Orientation:  Full (Time, Place, and Person) Thought Content:  Logical and Rumination Suicidal Thoughts:  No Homicidal Thoughts:  No Memory:  Immediate;   Good Recent;   Good Remote;   Fair Judgement:  Fair Insight:  Fair Psychomotor Activity:  Normal Concentration:  Concentration: Good and Attention Span: Good Recall:  Good Fund of Knowledge:  Good Language:  Good Akathisia:  No Handed:  Right AIMS (if indicated):    Assets:  Agricultural consultant Housing Resilience Social Support ADL's:  Intact Cognition:  WNL   Mental Status Per Nursing Assessment::   On Admission:   depressed  Demographic Factors:  Male, Caucasian, Low socioeconomic status, Living alone and Unemployed  Loss Factors: Decline in physical health and Financial problems/change in socioeconomic status  Historical Factors: Impulsivity  Risk Reduction Factors:   Religious beliefs about death and Positive social support  Continued Clinical Symptoms:  Depression:   Impulsivity More than one psychiatric diagnosis Previous Psychiatric Diagnoses and Treatments Medical Diagnoses and Treatments/Surgeries  Cognitive Features That Contribute To Risk:  Closed-mindedness    Suicide Risk:  Minimal: No identifiable suicidal ideation.  Patients presenting with no risk factors but with morbid ruminations; may be classified as minimal risk based on the severity of the depressive symptoms    Plan Of Care/Follow-up recommendations:  Activity:  as tolerated Diet:  Heart Healthy  Ethelene Hal, NP 04/03/2017, 12:09 PM

## 2017-04-03 NOTE — Consult Note (Signed)
[facility] Face-to-Face Psychiatry Consult   Reason for Consult: Worsening depression  Referring Physician:  EDP Patient Identification: BOLTON CANUPP MRN:  [id number] Principal Diagnosis: Severe episode of recurrent major depressive disorder, without psychotic features [facility]) Diagnosis:   Patient Active Problem List   Diagnosis Date Noted  . Family history of brain aneurysm [Z82.49]   . Intractable vomiting [R11.10] 10/20/2016  . History of intracranial aneurysm [Z86.79]   . Hypokalemia [E87.6]   . AKI (acute kidney injury) ([place]) [N17.9]   . Prolonged QT interval [R94.31]   . Lactic acidosis [E87.2]   . Dysphagia [R13.10]   . Esophageal ring [K22.2]   . Gastroesophageal reflux disease with esophagitis [K21.0]   . Nausea and vomiting [R11.2] 10/01/2016  . 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 Severe episode of recurrent major depressive disorder, without psychotic features ([place]) [F33.2] 07/20/2016  . CVA (cerebral vascular accident) ([place]) [I63.9] 06/15/2016  . Abdominal pain [R10.9]   . Numbness [R20.0]   . Carpal tunnel syndrome, bilateral [G56.03] 12/16/2015  . Status post stroke [Z86.73] 11/12/2015  . Transient neurologic deficit [R29.818] 10/15/2015  . Cerebrovascular accident, late effects [I69.90] 10/07/2015  . 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 Anxiety state [F41.1]   . 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 Cerebral infarction due to embolism of right middle cerebral artery ([place]) [I63.411] 09/23/2015  . Cerebral infarction due to embolism of cerebral artery ([place]) [I63.40] 09/23/2015  . Gait disturbance, post-stroke [I69.398, R26.9]   . Drooping of mouth [R29.810]   . Cerebrovascular accident (CVA) due to embolism of right anterior cerebral artery ([place]) [I63.421]   . Benign essential HTN [I10]   . Migraine with aura and without status migrainosus, not intractable [G43.109]   . Tachypnea [R06.82]   . Prediabetes [R73.03]   . Acute blood loss anemia [D62]   . Brain  aneurysm [I67.1] 09/20/2015  . Type 2 diabetes mellitus ([place]) [E11.9] 09/15/2015  . Leukocytosis [D72.829] 09/15/2015  . Aneurysm, cerebral, nonruptured [I67.1] 09/15/2015  . TIA (transient ischemic attack) [G45.9] 09/14/2015  . Temporary cerebral vascular dysfunction [G93.9] 09/14/2015  . H/O transient cerebral ischemia [Z86.73] 07/29/2015  . At risk for falling [Z91.81] 07/29/2015  . Bilateral hearing loss [H91.93] 10/25/2014  . Abnormal fear [F40.9] 08/08/2012  . Headache, migraine [G43.909] 08/08/2012  . Arthritis, degenerative [M19.90] 08/08/2012  . History of tear of ACL (anterior cruciate ligament) [Z87.828] 03/08/2011  . History of knee problem [Z87.39] 03/08/2011  . Depression [F32.9] 09/07/2010  . Depressive disorder, not elsewhere classified [F32.9] 09/07/2010  . URI (upper respiratory infection) [J06.9] 08/24/2010  . Hearing loss [H91.90] 09/06/2006  . Hyperlipidemia [E78.5] 04/23/2006  . Essential hypertension [I10] 04/23/2006  . Psychophysiological insomnia [F51.04] 04/23/2006    Total Time spent with patient: 45 minutes  Subjective:   OTHO Hoover is a 61 y.o. male patient admitted with worsening depression.  HPI:  Pt was seen and chart reviewed with treatment team and Dr Mariea Clonts. Pt [facility] the [facility], voluntarily, due to worsening depression that was affecting his ability to function at home. Pt stated on admission he did not feel safe at home. Pt does not have access to weapons.  Pt lives alone but has a sister in [place] that comes to help him once a week. Pt had a cerebral aneurysm in 2016 and recently was told the vessel is dilating again and he may have to have brain surgery. Pt stated he does not drive anymore and is fearful to go outside and do  things he used to do because of his brain aneurysm. Pt stated he lost his job due to his health issues. Pt denies drug and alcohol use, UDS positive for benzos, BAL negative. Pt does not have an active prescription on  the registry for benzos. Today,  Pt denies suicidal/homicidal ideation, denies auditory/visual hallucinations and does not appear to be responding to internal stimuli. Pt was able to contract for safety upon discharge. He is future oriented and accepting of resources for follow up. Pt is stable and psychiatrically clear for discharge.   Past Psychiatric History: As above  Risk to Self: None Risk to Others: None Prior Inpatient Therapy: Prior Inpatient Therapy: No Prior Therapy Dates: None Prior Therapy Facilty/Provider(s): None Reason for Treatment: None Prior Outpatient Therapy: Prior Outpatient Therapy: Yes Prior Therapy Dates: Unknown Prior Therapy Facilty/Provider(s): Unknown Reason for Treatment: Depression Does patient have an ACCT team?: No Does patient have Intensive In-House Services?  : No Does patient have [facility] services? : No Does patient have [facility] services?: No  Past Medical History:  Past Medical History:  Diagnosis Date  . Allergy   . Anxiety   . Cataract   . Cerebral hemorrhage ([place])   . Chest pain, atypical    12/2003:  negative cardiolyte  . Depression   . Drug abuse ([place])   . Eczema   . Erectile dysfunction   . History of meniscal tear    bilateral  . HLD (hyperlipidemia)   . HTN (hypertension)   . Hx of tear of ACL (anterior cruciate ligament)    right  . Insomnia   . Olecranon bursitis of left elbow 10/2009   s/p I&D by Dr Maxie Better, initially assessed by Dr. Nori Riis   . Prediabetes   . Stroke ([place])   . TIA (transient ischemic attack)     Past Surgical History:  Procedure Laterality Date  . INCISE AND DRAIN ABCESS     L elbow due to cellulitis/bursitis  . INNER EAR SURGERY    . IR GENERIC HISTORICAL  12/28/2015   IR ANGIO VERTEBRAL SEL VERTEBRAL UNI L MOD SED 12/28/2015 Luanne Bras, MD [facility]-INTER[facility]  . IR GENERIC HISTORICAL  12/28/2015   IR ANGIO VERTEBRAL SEL SUBCLAVIAN INNOMINATE UNI R MOD SED 12/28/2015 Luanne Bras, MD [facility]  . IR  GENERIC HISTORICAL  12/28/2015   IR ANGIO INTRA EXTRACRAN SEL INTERNAL CAROTID BILAT MOD SED 12/28/2015 Luanne Bras, MD [facility]  . KNEE ARTHROSCOPY    . SKIN TAG REMOVAL     11 removed   Family History:  Family History  Problem Relation Age of Onset  . Stroke Mother   . Hypertension Mother   . Aneurysm Mother 39       Died of brain aneursym  . Heart failure Father   . Emphysema Father   . Diabetes Mellitus II Sister   . Colon cancer Neg Hx   . Rectal cancer Neg Hx   . Stomach cancer Neg Hx    Family Psychiatric  History: Unknown Social History:  Social History   Substance and Sexual Activity  Alcohol Use No  . Alcohol/week: 0.0 oz     Social History   Substance and Sexual Activity  Drug Use No    Social History   Socioeconomic History  . Marital status: Divorced    Spouse name: None  . Number of children: None  . Years of education: None  . Highest education level: None  Social Needs  . Emergency planning/management officer  strain: None  . Food insecurity - worry: None  . Food insecurity - inability: None  . Transportation needs - medical: None  . Transportation needs - non-medical: None  Occupational History  . None  Tobacco Use  . Smoking status: Never Smoker  . Smokeless tobacco: Never Used  Substance and Sexual Activity  . Alcohol use: No    Alcohol/week: 0.0 oz  . Drug use: No  . Sexual activity: No  Other Topics Concern  . None  Social History Narrative   Separated, has 2 daughters, works as a Forensic psychologist.  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 Metformin Diarrhea    Labs:  Results for orders placed or performed during the hospital encounter of 04/01/17 (from the past 48 hour(s))  Comprehensive metabolic panel     Status: Abnormal   Collection Time: 04/01/17 10:26 PM  Result Value Ref Range    Sodium 138 135 - 145 mmol/L   Potassium 4.1 3.5 - 5.1 mmol/L   Chloride 103 101 - 111 mmol/L   CO2 25 22 - 32 mmol/L   Glucose, Bld 166 (H) 65 - 99 mg/dL   BUN 20 6 - 20 mg/dL   Creatinine, Ser 0.93 0.61 - 1.24 mg/dL   Calcium 8.6 (L) 8.9 - 10.3 mg/dL   Total Protein 7.3 6.5 - 8.1 g/dL   Albumin 3.5 3.5 - 5.0 g/dL   AST 20 15 - 41 U/L   ALT 21 17 - 63 U/L   Alkaline Phosphatase 93 38 - 126 U/L   Total Bilirubin 0.7 0.3 - 1.2 mg/dL   GFR calc non Af Amer >60 >60 mL/min   GFR calc Af Amer >60 >60 mL/min    Comment: (NOTE) The eGFR has been calculated using the CKD EPI equation. This calculation has not been validated in all clinical situations. eGFR's persistently <60 mL/min signify possible Chronic Kidney Disease.    Anion gap 10 5 - 15  Ethanol     Status: None   Collection Time: 04/01/17 10:26 PM  Result Value Ref Range   Alcohol, Ethyl (B) <10 <10 mg/dL    Comment:        LOWEST DETECTABLE LIMIT FOR SERUM ALCOHOL IS 10 mg/dL FOR MEDICAL PURPOSES ONLY   Salicylate level     Status: None   Collection Time: 04/01/17 10:26 PM  Result Value Ref Range   Salicylate Lvl <1.9 2.8 - 30.0 mg/dL  Acetaminophen level     Status: Abnormal   Collection Time: 04/01/17 10:26 PM  Result Value Ref Range   Acetaminophen (Tylenol), Serum <10 (L) 10 - 30 ug/mL    Comment:        THERAPEUTIC CONCENTRATIONS VARY SIGNIFICANTLY. A RANGE OF 10-30 ug/mL MAY BE AN EFFECTIVE CONCENTRATION FOR MANY PATIENTS. HOWEVER, SOME ARE BEST TREATED AT CONCENTRATIONS OUTSIDE THIS RANGE. ACETAMINOPHEN CONCENTRATIONS >150 ug/mL AT 4 HOURS AFTER INGESTION AND >50 ug/mL AT 12 HOURS AFTER INGESTION ARE OFTEN ASSOCIATED WITH TOXIC REACTIONS.   cbc     Status: Abnormal   Collection Time: 04/01/17 10:26 PM  Result Value Ref Range   WBC 12.1 (H) 4.0 - 10.5 K/uL   RBC 4.89 4.22 - 5.81 MIL/uL   Hemoglobin 14.2 13.0 - 17.0 g/dL   HCT 42.8 39.0 - 52.0 %   MCV 87.5 78.0 - 100.0  fL   MCH 29.0 26.0 - 34.0 pg    MCHC 33.2 30.0 - 36.0 g/dL   RDW 13.4 11.5 - 15.5 %   Platelets 386 150 - 400 K/uL  Rapid urine drug screen (hospital performed)     Status: Abnormal   Collection Time: 04/01/17 10:26 PM  Result Value Ref Range   Opiates NONE DETECTED NONE DETECTED   Cocaine NONE DETECTED NONE DETECTED   Benzodiazepines POSITIVE (A) NONE DETECTED   Amphetamines NONE DETECTED NONE DETECTED   Tetrahydrocannabinol NONE DETECTED NONE DETECTED   Barbiturates NONE DETECTED NONE DETECTED    Comment:        DRUG SCREEN FOR MEDICAL PURPOSES ONLY.  IF CONFIRMATION IS NEEDED FOR ANY PURPOSE, NOTIFY LAB WITHIN 5 DAYS.        LOWEST DETECTABLE LIMITS FOR URINE DRUG SCREEN Drug Class       Cutoff (ng/mL) Amphetamine      1000 Barbiturate      200 Benzodiazepine   254 Tricyclics       270 Opiates          300 Cocaine          300 THC              50   CBG monitoring, ED     Status: Abnormal   Collection Time: 04/01/17 10:39 PM  Result Value Ref Range   Glucose-Capillary 162 (H) 65 - 99 mg/dL  Urinalysis, Routine w reflex microscopic     Status: Abnormal   Collection Time: 04/02/17 10:50 AM  Result Value Ref Range   Color, Urine YELLOW YELLOW   APPearance CLEAR CLEAR   Specific Gravity, Urine 1.014 1.005 - 1.030   pH 7.0 5.0 - 8.0   Glucose, UA NEGATIVE NEGATIVE mg/dL   Hgb urine dipstick NEGATIVE NEGATIVE   Bilirubin Urine NEGATIVE NEGATIVE   Ketones, ur NEGATIVE NEGATIVE mg/dL   Protein, ur NEGATIVE NEGATIVE mg/dL   Nitrite NEGATIVE NEGATIVE   Leukocytes, UA TRACE (A) NEGATIVE   RBC / HPF 0-5 0 - 5 RBC/hpf   WBC, UA 0-5 0 - 5 WBC/hpf   Bacteria, UA RARE (A) NONE SEEN   Squamous Epithelial / LPF NONE SEEN NONE SEEN   Mucus PRESENT   Urine rapid drug screen (hosp performed)     Status: Abnormal   Collection Time: 04/02/17 10:50 AM  Result Value Ref Range   Opiates NONE DETECTED NONE DETECTED   Cocaine NONE DETECTED NONE DETECTED   Benzodiazepines POSITIVE (A) NONE DETECTED   Amphetamines  NONE DETECTED NONE DETECTED   Tetrahydrocannabinol NONE DETECTED NONE DETECTED   Barbiturates NONE DETECTED NONE DETECTED    Comment:        DRUG SCREEN FOR MEDICAL PURPOSES ONLY.  IF CONFIRMATION IS NEEDED FOR ANY PURPOSE, NOTIFY LAB WITHIN 5 DAYS.        LOWEST DETECTABLE LIMITS FOR URINE DRUG SCREEN Drug Class       Cutoff (ng/mL) Amphetamine      1000 Barbiturate      200 Benzodiazepine   623 Tricyclics       762 Opiates          300 Cocaine          300 THC              50   CBG monitoring, ED     Status: Abnormal   Collection Time: 04/03/17  8:11 AM  Result Value Ref Range  Glucose-Capillary 147 (H) 65 - 99 mg/dL    Current Facility-Administered Medications  Medication Dose Route Frequency Provider Last Rate Last Dose  . amLODipine (NORVASC) tablet 10 mg  10 mg Oral Daily Recardo Evangelist, PA-C   10 mg at 04/03/17 1022  . atorvastatin (LIPITOR) tablet 60 mg  60 mg Oral q1800 Recardo Evangelist, PA-C   60 mg at 04/02/17 1835  . chlorthalidone (HYGROTON) tablet 25 mg  25 mg Oral Daily Recardo Evangelist, PA-C   25 mg at 04/03/17 1023  . FLUoxetine (PROZAC) capsule 20 mg  20 mg Oral Daily Recardo Evangelist, PA-C   20 mg at 04/03/17 1022  . gabapentin (NEURONTIN) capsule 300 mg  300 mg Oral QHS Recardo Evangelist, PA-C   300 mg at 04/02/17 2154  . glipiZIDE (GLUCOTROL XL) 24 hr tablet 10 mg  10 mg Oral Q breakfast Recardo Evangelist, PA-C   10 mg at 04/03/17 2426  . indapamide (LOZOL) tablet 1.25 mg  1.25 mg Oral Daily Recardo Evangelist, PA-C   1.25 mg at 04/03/17 1023  . lisinopril (PRINIVIL,ZESTRIL) tablet 40 mg  40 mg Oral Daily Recardo Evangelist, PA-C   40 mg at 04/03/17 1023  . metoprolol tartrate (LOPRESSOR) tablet 50 mg  50 mg Oral BID Recardo Evangelist, PA-C   50 mg at 04/03/17 1022   Current Outpatient Medications  Medication Sig Dispense Refill  . amLODipine (NORVASC) 10 MG tablet Take 1 tablet (10 mg total) by mouth daily. 30 tablet 0  . atorvastatin  (LIPITOR) 20 MG tablet Take 20 mg daily by mouth.    Marland Kitchen atorvastatin (LIPITOR) 40 MG tablet Take 1 tablet (40 mg total) by mouth daily at 6 PM. 30 tablet 0  . chlorthalidone (HYGROTON) 25 MG tablet Take 25 mg daily by mouth.     Marland Kitchen FLUoxetine (PROZAC) 20 MG capsule Take 1 capsule (20 mg total) by mouth daily. Take with 40 mg for a total of 60 mg 90 capsule 1  . gabapentin (NEURONTIN) 300 MG capsule Take 300 mg by mouth at bedtime.    Marland Kitchen glipiZIDE (GLUCOTROL XL) 10 MG 24 hr tablet Take 10 mg daily with breakfast by mouth.    Marland Kitchen lisinopril (PRINIVIL,ZESTRIL) 40 MG tablet Take 40 mg by mouth daily.    . metoprolol (LOPRESSOR) 50 MG tablet Take 1 tablet (50 mg total) by mouth 2 (two) times daily. 60 tablet 0  . clopidogrel (PLAVIX) 75 MG tablet Take 1 tablet (75 mg total) by mouth daily. (Patient not taking: Reported on 03/12/2017) 30 tablet 0  . indapamide (LOZOL) 1.25 MG tablet Take 1.25 mg daily by mouth.     . meclizine (ANTIVERT) 32 MG tablet Take 1 tablet (32 mg total) by mouth 3 (three) times daily as needed. (Patient not taking: Reported on 04/02/2017) 30 tablet 0  . pantoprazole (PROTONIX) 40 MG tablet Take 1 tablet (40 mg total) by mouth daily. (Patient not taking: Reported on 04/02/2017) 30 tablet 0    Musculoskeletal: Strength & Muscle Tone: within normal limits Gait & Station: normal Patient leans: N/A  Psychiatric Specialty Exam: Physical Exam  Constitutional: He is oriented to person, place, and time. He appears well-developed and well-nourished.  HENT:  Head: Normocephalic.  Respiratory: Effort normal.  Neurological: He is alert and oriented to person, place, and time.  Psychiatric: His speech is normal and behavior is normal. Thought content normal. His mood appears anxious. Cognition and memory are not impaired. He  expresses impulsivity. He exhibits a depressed mood.    Review of Systems  Psychiatric/Behavioral: Positive for depression. Negative for hallucinations, memory loss,  substance abuse and suicidal ideas. The patient is nervous/anxious. The patient does not have insomnia.   All other systems reviewed and are negative.   Blood pressure 126/68, pulse 70, temperature 99 F (37.2 C), temperature source Oral, resp. rate 18, SpO2 97 %.There is no height or weight on file to calculate BMI.  General Appearance: Casual  Eye Contact:  Good  Speech:  Clear and Coherent and Normal Rate  Volume:  Normal  Mood:  Anxious and Depressed  Affect:  Congruent and Depressed  Thought Process:  Coherent, Goal Directed and Linear  Orientation:  Full (Time, Place, and Person)  Thought Content:  Logical and Rumination  Suicidal Thoughts:  No  Homicidal Thoughts:  No  Memory:  Immediate;   Good Recent;   Good Remote;   Fair  Judgement:  Fair  Insight:  Fair  Psychomotor Activity:  Normal  Concentration:  Concentration: Good and Attention Span: Good  Recall:  Good  Fund of Knowledge:  Good  Language:  Good  Akathisia:  No  Handed:  Right  AIMS (if indicated):   N/A  Assets:  Agricultural consultant Housing Resilience Social Support  ADL's:  Intact  Cognition:  WNL  Sleep:   Okay     Treatment Plan Summary: Plan Major Depressive disorder without psychotic feature, severe  Discharge Home Follow [facility] with [facility] services for medication management Follow up with Dr. Daron Offer for psychiatry Take all medications as prescribed Avoid the use of alcohol and illicit drugs Follow up with your PCP for any medical concerns  Disposition: No evidence of imminent risk to self or others at present.   Patient does not meet criteria for psychiatric inpatient admission. Supportive therapy provided about ongoing stressors. Discussed crisis plan, support from social network, calling 911, coming to the Emergency Department, and calling Suicide Hotline.  Ethelene Hal, NP 04/03/2017 11:52 AM   Patient seen face-to-face for psychiatric evaluation,  chart reviewed and case discussed with the physician extender and developed treatment plan. Reviewed the information documented and agree with the treatment plan.  Buford Dresser, DO

## 2017-04-03 NOTE — Discharge Instructions (Signed)
Follow up with:   [facility]     Address: [address], [place], [place] [address]  Hours: 8AM to 5PM  Mon-Fri Walk-in     Phone: [phone]

## 2017-04-03 NOTE — Care Management Note (Signed)
Case Management Note  CM was consulted for medication and transportation assistance for pt to his appointments.  CM reviewed chart and noted that pt's PCP office has been involved with pt for these resources for months now. Spoke with pt who states Medicaid disability should be decided on the 16th of this month, he filed 90 days ago, and that Medicare disability should be decided on the 20th of this month, he filed in Dec.  CM mentioned that since he turned 32 after that initial Medicare filing in Dec, he could file now for early retirement.  Pt states that he would not be able to live off of that amount but should be able to under disability.  Pt states his PCP office has tried to help him schedule an appointment for the orange card but he hasn't been able to get to the appointments yet nor has he been able to get back to see his PCP due to transportation issues.  Pt reports his sister who lives in [place] has to come take him.  Pt states he does not know how to use the bus system.  Advised pt that CM would contact CSW to come speak with him about transportation home today as well as how to use the bus system.  Placed Melburn Popper information on AVS in addition.  CM discussed goodrx.com but pt did not seem to understand how it worked.  Placed it on AVS as well so that family may be able to help him with it.   Discussed with pt that [facility] should be able to help him with his psychiatric medications and also possibly transportation to appointments at their office. Pt reports he gets 100% medical assistance through the [facility] system.  CM and pt discussed that it would not be appropirate to change PCP offices based on the resources he has available to him through them.  Both [facility] and PCP office information are on AVS.  No further CM needs noted at this time.

## 2017-04-08 ENCOUNTER — Emergency Department (HOSPITAL_COMMUNITY)
Admission: EM | Admit: 2017-04-08 | Discharge: 2017-04-09 | Disposition: A | Discharge status: Other Acute Inpatient Hospital | Payer: Medicaid Other | Attending: Emergency Medicine | Admitting: Emergency Medicine

## 2017-04-08 ENCOUNTER — Encounter (HOSPITAL_COMMUNITY): Payer: Self-pay

## 2017-04-08 ENCOUNTER — Other Ambulatory Visit: Payer: Self-pay

## 2017-04-08 DIAGNOSIS — I10 Essential (primary) hypertension: Secondary | ICD-10-CM | POA: Insufficient documentation

## 2017-04-08 DIAGNOSIS — G479 Sleep disorder, unspecified: Secondary | ICD-10-CM | POA: Insufficient documentation

## 2017-04-08 DIAGNOSIS — F419 Anxiety disorder, unspecified: Secondary | ICD-10-CM | POA: Insufficient documentation

## 2017-04-08 DIAGNOSIS — Z7984 Long term (current) use of oral hypoglycemic drugs: Secondary | ICD-10-CM | POA: Insufficient documentation

## 2017-04-08 DIAGNOSIS — E1165 Type 2 diabetes mellitus with hyperglycemia: Secondary | ICD-10-CM | POA: Insufficient documentation

## 2017-04-08 DIAGNOSIS — Z7902 Long term (current) use of antithrombotics/antiplatelets: Secondary | ICD-10-CM | POA: Insufficient documentation

## 2017-04-08 DIAGNOSIS — F329 Major depressive disorder, single episode, unspecified: Secondary | ICD-10-CM | POA: Insufficient documentation

## 2017-04-08 DIAGNOSIS — F339 Major depressive disorder, recurrent, unspecified: Secondary | ICD-10-CM | POA: Diagnosis present

## 2017-04-08 DIAGNOSIS — Z79899 Other long term (current) drug therapy: Secondary | ICD-10-CM | POA: Insufficient documentation

## 2017-04-08 DIAGNOSIS — Z046 Encounter for general psychiatric examination, requested by authority: Secondary | ICD-10-CM | POA: Insufficient documentation

## 2017-04-08 DIAGNOSIS — Z008 Encounter for other general examination: Secondary | ICD-10-CM

## 2017-04-08 DIAGNOSIS — R45851 Suicidal ideations: Secondary | ICD-10-CM | POA: Insufficient documentation

## 2017-04-08 LAB — CBC
HCT: 42.8 % (ref 39.0–52.0)
Hemoglobin: 14.6 g/dL (ref 13.0–17.0)
MCH: 29.6 pg (ref 26.0–34.0)
MCHC: 34.1 g/dL (ref 30.0–36.0)
MCV: 86.8 fL (ref 78.0–100.0)
PLATELETS: 390 10*3/uL (ref 150–400)
RBC: 4.93 MIL/uL (ref 4.22–5.81)
RDW: 14.1 % (ref 11.5–15.5)
WBC: 14.3 10*3/uL — AB (ref 4.0–10.5)

## 2017-04-08 LAB — COMPREHENSIVE METABOLIC PANEL
ALK PHOS: 99 U/L (ref 38–126)
ALT: 24 U/L (ref 17–63)
AST: 19 U/L (ref 15–41)
Albumin: 3.8 g/dL (ref 3.5–5.0)
Anion gap: 11 (ref 5–15)
BUN: 16 mg/dL (ref 6–20)
CALCIUM: 9.3 mg/dL (ref 8.9–10.3)
CHLORIDE: 107 mmol/L (ref 101–111)
CO2: 22 mmol/L (ref 22–32)
CREATININE: 0.98 mg/dL (ref 0.61–1.24)
Glucose, Bld: 163 mg/dL — ABNORMAL HIGH (ref 65–99)
Potassium: 3.6 mmol/L (ref 3.5–5.1)
Sodium: 140 mmol/L (ref 135–145)
Total Bilirubin: 0.7 mg/dL (ref 0.3–1.2)
Total Protein: 7.6 g/dL (ref 6.5–8.1)

## 2017-04-08 LAB — RAPID URINE DRUG SCREEN, HOSP PERFORMED
AMPHETAMINES: NOT DETECTED
BENZODIAZEPINES: POSITIVE — AB
Barbiturates: NOT DETECTED
Cocaine: NOT DETECTED
OPIATES: NOT DETECTED
Tetrahydrocannabinol: NOT DETECTED

## 2017-04-08 LAB — ETHANOL

## 2017-04-08 LAB — SALICYLATE LEVEL

## 2017-04-08 LAB — ACETAMINOPHEN LEVEL: Acetaminophen (Tylenol), Serum: <10 ug/mL — ABNORMAL LOW (ref 10–30)

## 2017-04-08 MED ORDER — AMLODIPINE BESYLATE 5 MG PO TABS
10.0000 mg | ORAL_TABLET | Freq: Every day | ORAL | Status: DC
  Administered 2017-04-09: 10 mg via ORAL
  Filled 2017-04-08: qty 2

## 2017-04-08 MED ORDER — GLIPIZIDE ER 10 MG PO TB24
10.0000 mg | ORAL_TABLET | Freq: Every day | ORAL | Status: DC
  Administered 2017-04-09: 10 mg via ORAL
  Filled 2017-04-08: qty 1

## 2017-04-08 MED ORDER — METOPROLOL TARTRATE 25 MG PO TABS
50.0000 mg | ORAL_TABLET | Freq: Two times a day (BID) | ORAL | Status: DC
  Administered 2017-04-08 – 2017-04-09 (×2): 50 mg via ORAL
  Filled 2017-04-08 (×2): qty 2

## 2017-04-08 MED ORDER — ATORVASTATIN CALCIUM 40 MG PO TABS
40.0000 mg | ORAL_TABLET | Freq: Every day | ORAL | Status: DC
  Filled 2017-04-08: qty 1

## 2017-04-08 MED ORDER — ACETAMINOPHEN 325 MG PO TABS: 650.0000 mg | ORAL_TABLET | ORAL | Status: DC | PRN

## 2017-04-08 MED ORDER — LISINOPRIL 20 MG PO TABS
40.0000 mg | ORAL_TABLET | Freq: Every day | ORAL | Status: DC
  Administered 2017-04-09: 40 mg via ORAL
  Filled 2017-04-08: qty 2

## 2017-04-08 MED ORDER — FLUOXETINE HCL 20 MG PO CAPS
60.0000 mg | ORAL_CAPSULE | Freq: Every day | ORAL | Status: DC
  Administered 2017-04-09: 60 mg via ORAL
  Filled 2017-04-08: qty 3

## 2017-04-08 MED ORDER — GABAPENTIN 300 MG PO CAPS
300.0000 mg | ORAL_CAPSULE | Freq: Every day | ORAL | Status: DC
  Administered 2017-04-08: 300 mg via ORAL
  Filled 2017-04-08: qty 1

## 2017-04-08 MED ORDER — ZOLPIDEM TARTRATE 5 MG PO TABS: 5.0000 mg | ORAL_TABLET | Freq: Every evening | ORAL | Status: DC | PRN

## 2017-04-08 MED ORDER — ALUM & MAG HYDROXIDE-SIMETH 200-200-20 MG/5ML PO SUSP: 30.0000 mL | Freq: Four times a day (QID) | ORAL | Status: DC | PRN

## 2017-04-08 MED ORDER — HYDROXYZINE HCL 25 MG PO TABS
50.0000 mg | ORAL_TABLET | Freq: Once | ORAL | Status: AC
  Administered 2017-04-08: 50 mg via ORAL
  Filled 2017-04-08: qty 2

## 2017-04-08 MED ORDER — CHLORTHALIDONE 25 MG PO TABS
25.0000 mg | ORAL_TABLET | Freq: Every day | ORAL | Status: DC
  Administered 2017-04-09: 25 mg via ORAL
  Filled 2017-04-08: qty 1

## 2017-04-08 NOTE — ED Triage Notes (Signed)
Pt arrived via EMS, Pt presents that pt feels that if he is left alone he may harm himself, ( no plan at this at this time) this event is trigger by recently learning that a brain aneurysm has reoccurred and is feeling SI.  Pt reports that he has not been compliant with Prozac medication as he has not been able to get back to PCP for dose adjustment as well as has not had money to pay for medications as per EMS. Pt states that he does not want to hurt himself, and reports his feeling changes moment to moment,  but states that" I  cries all the time", and denies auditory and visual hallucinations.    Pt has hx of anxiety.  V/S 150/90 HR 66 RR 18 CBG 169

## 2017-04-08 NOTE — ED Notes (Signed)
Report to include Situation, Background, Assessment, and Recommendations received from Edie RN. Patient alert and oriented, warm and dry, in no acute distress. Patient denies SI, HI, AVH and pain. Patient made aware of Q15 minute rounds and security cameras for their safety. Patient instructed to come to me with needs or concerns. 

## 2017-04-08 NOTE — ED Notes (Signed)
Hourly rounding reveals patient in room. No complaints, stable, in no acute distress. Q15 minute rounds and monitoring via Security Cameras to continue. 

## 2017-04-08 NOTE — ED Notes (Signed)
Hourly rounding reveals patient sleeping in room. No complaints, stable, in no acute distress. Q15 minute rounds and monitoring via Security Cameras to continue. 

## 2017-04-08 NOTE — BH Assessment (Signed)
[facility] Assessment Progress Note   Lindon Romp, NP recommends inpt treatment due to ongoing risk factors for suicide and a recent suicide attempt. EDP Street, [place], PA-C has been notified of disposition and in agreement. Pt's nurse Dominica Severin, RN has been advised of recommendation.   Lind Covert, MSW, LCSW Therapeutic Triage Specialist  [phone]

## 2017-04-08 NOTE — ED Notes (Signed)
Hourly rounding reveals patient in room. Stable, in no acute distress. Q15 minute rounds and monitoring via Security Cameras to continue. 

## 2017-04-08 NOTE — ED Notes (Signed)
Bed: [facility]PT4 Expected date:  Expected time:  Means of arrival:  Comments: 
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
## 2017-04-08 NOTE — ED Provider Notes (Signed)
[facility] DEPT Provider Note   CSN: [id number] Arrival date & time: 04/08/17  1702     History   Chief Complaint Chief Complaint  Patient presents with  . Suicidal    HPI [name] is a 61 y.o. male with a PMHx of depression, anxiety, drug abuse, DM2, HTN, HLD, brain aneurysm, TIA/CVA, and other conditions listed below, who presents to the ED with complaints of ongoing suicidal ideations without a plan. Chart review reveals he was seen on 04/02/17 for similar complaints, was assessed by [facility] and the next day was discharged with outpatient resources. Pt states that when he left, he didn't want to hurt himself, but over the last 3 days he's been having more intrusive passive suicidal thoughts without specific plans. He's afraid he's going to hurt himself. He states he's been crying a lot, not sleeping well (no sleep since Friday), and very anxious. States on the way here, his "nerves got to him" and he vomited once (nonbloody and nonbilious). He denies HI, AVH, drug use, EtOH use, or tobacco use. Compliant with all meds, including prozac 60mg  which he took around 11am; states he took his gabapentin last night but not tonight; also states he's had all his other home meds but not the second dose of metoprolol which is due at bedtime. He is here voluntarily asking for help with his worsening depression. He denies fevers, chills, CP, SOB, abd pain, diarrhea/constipation, obstipation, melena, hematochezia, hematemesis, hematuria, dysuria, myalgias, arthralgias, numbness, tingling, focal weakness, or any other complaints at this time.    The history is provided by the patient and medical records. No language interpreter was used.  Mental Health Problem  Presenting symptoms: depression and suicidal thoughts   Presenting symptoms: no hallucinations and no homicidal ideas   Onset quality:  Gradual Duration:  3 days Timing:  Constant Progression:   Worsening Chronicity:  Recurrent Context: stressful life event   Treatment compliance:  All of the time Time since last psychoactive medication taken:  7 hours Relieved by:  None tried Worsened by:  Nothing Ineffective treatments:  None tried Associated symptoms: anxiety   Associated symptoms: no abdominal pain and no chest pain   Risk factors: hx of mental illness and recent psychiatric admission     Past Medical History:  Diagnosis Date  . Allergy   . Anxiety   . Cataract   . Cerebral hemorrhage ([place])   . Chest pain, atypical    12/2003:  negative cardiolyte  . Depression   . Drug abuse ([place])   . Eczema   . Erectile dysfunction   . History of meniscal tear    bilateral  . HLD (hyperlipidemia)   . HTN (hypertension)   . Hx of tear of ACL (anterior cruciate ligament)    right  . Insomnia   . Olecranon bursitis of left elbow 10/2009   s/p I&D by Dr Maxie Better, initially assessed by Dr. Nori Riis   . Prediabetes   . Stroke ([place])   . TIA (transient ischemic attack)     Patient Active Problem List   Diagnosis Date Noted  . Family history of brain aneurysm   . Intractable vomiting 10/20/2016  . History of intracranial aneurysm   . Hypokalemia   . AKI (acute kidney injury) ([place])   . Prolonged QT interval   . Lactic acidosis   . Dysphagia   . Esophageal ring   . Gastroesophageal reflux disease with esophagitis   . Nausea and vomiting  10/01/2016  . 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 Severe episode of recurrent major depressive disorder, without psychotic features ([place]) 07/20/2016  . CVA (cerebral vascular accident) ([place]) 06/15/2016  . Abdominal pain   . Numbness   . Carpal tunnel syndrome, bilateral 12/16/2015  . Status post stroke 11/12/2015  . Transient neurologic deficit 10/15/2015  . Cerebrovascular accident, late effects 10/07/2015  . Complicated migraine   . Anxiety state   . 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 Cerebral infarction due to embolism of right middle  cerebral artery ([place]) 09/23/2015  . Cerebral infarction due to embolism of cerebral artery ([place]) 09/23/2015  . Gait disturbance, post-stroke   . Drooping of mouth   . Cerebrovascular accident (CVA) due to embolism of right anterior cerebral artery ([place])   . Benign essential HTN   . Migraine with aura and without status migrainosus, not intractable   . Tachypnea   . Prediabetes   . Acute blood loss anemia   . Brain aneurysm 09/20/2015  . Type 2 diabetes mellitus ([place]) 09/15/2015  . Leukocytosis 09/15/2015  . Aneurysm, cerebral, nonruptured 09/15/2015  . TIA (transient ischemic attack) 09/14/2015  . Temporary cerebral vascular dysfunction 09/14/2015  . H/O transient cerebral ischemia 07/29/2015  . At risk for falling 07/29/2015  . Bilateral hearing loss 10/25/2014  . Abnormal fear 08/08/2012  . Headache, migraine 08/08/2012  . Arthritis, degenerative 08/08/2012  . History of tear of ACL (anterior cruciate ligament) 03/08/2011  . History of knee problem 03/08/2011  . Depression 09/07/2010  . Depressive disorder, not elsewhere classified 09/07/2010  . URI (upper respiratory infection) 08/24/2010  . Hearing loss 09/06/2006  . Hyperlipidemia 04/23/2006  . Essential hypertension 04/23/2006  . Psychophysiological insomnia 04/23/2006    Past Surgical History:  Procedure Laterality Date  . INCISE AND DRAIN ABCESS     L elbow due to cellulitis/bursitis  . INNER EAR SURGERY    . IR GENERIC HISTORICAL  12/28/2015   IR ANGIO VERTEBRAL SEL VERTEBRAL UNI L MOD SED 12/28/2015 Luanne Bras, MD [facility]  . IR GENERIC HISTORICAL  12/28/2015   IR ANGIO VERTEBRAL SEL SUBCLAVIAN INNOMINATE UNI R MOD SED 12/28/2015 Luanne Bras, MD [facility]  . IR GENERIC HISTORICAL  12/28/2015   IR ANGIO INTRA EXTRACRAN SEL INTERNAL CAROTID BILAT MOD SED 12/28/2015 Luanne Bras, MD [facility]  . KNEE ARTHROSCOPY    . SKIN TAG REMOVAL     11 removed       Home Medications    Prior to  Admission medications   Medication Sig Start Date End Date Taking? Authorizing Provider  amLODipine (NORVASC) 10 MG tablet Take 1 tablet (10 mg total) by mouth daily. 12/31/15  Yes Arrien, Jimmy Picket, MD  atorvastatin (LIPITOR) 20 MG tablet Take 20 mg daily by mouth.   Yes [provider]  atorvastatin (LIPITOR) 40 MG tablet Take 1 tablet (40 mg total) by mouth daily at 6 PM. 12/31/15  Yes Arrien, Jimmy Picket, MD  chlorthalidone (HYGROTON) 25 MG tablet Take 25 mg daily by mouth.  01/18/17  Yes [provider]  FLUoxetine (PROZAC) 20 MG capsule Take 1 capsule (20 mg total) by mouth daily. Take with 40 mg for a total of 60 mg 09/06/16  Yes Eksir, Richard Miu, MD  gabapentin (NEURONTIN) 300 MG capsule Take 300 mg by mouth at bedtime.   Yes [provider]  glipiZIDE (GLUCOTROL XL) 10 MG 24 hr tablet Take 10 mg daily with breakfast by mouth.   Yes [provider]  indapamide (LOZOL) 1.25 MG tablet Take 1.25 mg daily by mouth.  02/20/17  Yes [provider]  lisinopril (PRINIVIL,ZESTRIL) 40 MG tablet Take 40 mg by mouth daily. 12/28/16  Yes [provider]  metoprolol (LOPRESSOR) 50 MG tablet Take 1 tablet (50 mg total) by mouth 2 (two) times daily. 12/31/15  Yes Arrien, Jimmy Picket, MD  clopidogrel (PLAVIX) 75 MG tablet Take 1 tablet (75 mg total) by mouth daily. Patient not taking: Reported on 03/12/2017 09/30/15   Angiulli, Lavon Paganini, PA-C  meclizine (ANTIVERT) 32 MG tablet Take 1 tablet (32 mg total) by mouth 3 (three) times daily as needed. Patient not taking: Reported on 04/02/2017 10/24/16   Sela Hilding, MD  pantoprazole (PROTONIX) 40 MG tablet Take 1 tablet (40 mg total) by mouth daily. Patient not taking: Reported on 04/02/2017 10/06/16   Sela Hilding, MD    Family History Family History  Problem Relation Age of Onset  . Stroke Mother   . Hypertension Mother   . Aneurysm Mother 57       Died of brain aneursym  . Heart  failure Father   . Emphysema Father   . Diabetes Mellitus II Sister   . Colon cancer Neg Hx   . Rectal cancer Neg Hx   . Stomach cancer Neg Hx     Social History Social History   Tobacco Use  . Smoking status: Never Smoker  . Smokeless tobacco: Never Used  Substance Use Topics  . Alcohol use: No    Alcohol/week: 0.0 oz  . Drug use: No     Allergies   Metformin   Review of Systems Review of Systems  Constitutional: Negative for chills and fever.  Respiratory: Negative for shortness of breath.   Cardiovascular: Negative for chest pain.  Gastrointestinal: Positive for nausea and vomiting. Negative for abdominal pain, blood in stool, constipation and diarrhea.  Genitourinary: Negative for dysuria and hematuria.  Musculoskeletal: Negative for arthralgias and myalgias.  Skin: Negative for color change.  Allergic/Immunologic: Positive for immunocompromised state (DM2).  Neurological: Negative for weakness and numbness.  Psychiatric/Behavioral: Positive for sleep disturbance and suicidal ideas. Negative for hallucinations and homicidal ideas. The patient is nervous/anxious.    All other systems reviewed and are negative for acute change except as noted in the HPI.    Physical Exam Updated Vital Signs BP 131/76 (BP Location: Left Arm)   Pulse 63   Temp 98.7 F (37.1 C) (Oral)   Resp 20   SpO2 100%   Physical Exam  Constitutional: He is oriented to person, place, and time. Vital signs are normal. He appears well-developed and well-nourished.  Non-toxic appearance. No distress.  Afebrile, nontoxic, NAD  HENT:  Head: Normocephalic and atraumatic.  Mouth/Throat: Oropharynx is clear and moist and mucous membranes are normal.  Eyes: Conjunctivae and EOM are normal. Right eye exhibits no discharge. Left eye exhibits no discharge.  Neck: Normal range of motion. Neck supple.  Cardiovascular: Normal rate, regular rhythm, normal heart sounds and intact distal pulses. Exam reveals  no gallop and no friction rub.  No murmur heard. Pulmonary/Chest: Effort normal and breath sounds normal. No respiratory distress. He has no decreased breath sounds. He has no wheezes. He has no rhonchi. He has no rales.  Abdominal: Soft. Normal appearance and bowel sounds are normal. He exhibits no distension. There is no tenderness. There is no rigidity, no rebound, no guarding, no CVA tenderness, no tenderness at McBurney's point and negative Murphy's sign.  Musculoskeletal: Normal range of motion.  Neurological: He is alert and oriented to person, place, and time. He has normal strength. No sensory deficit.  Skin: Skin is warm, dry and intact. No rash noted.  Psychiatric: He is not actively hallucinating. He exhibits a depressed mood. He expresses suicidal ideation. He expresses no homicidal ideation. He expresses no suicidal plans and no homicidal plans.  Depressed affect, but pleasant and cooperative. Endorsing passive SI without a plan, denies HI or AVH, doesn't seem to be responding to internal stimuli.   Nursing note and vitals reviewed.    ED Treatments / Results  Labs (all labs ordered are listed, but only abnormal results are displayed) Labs Reviewed  COMPREHENSIVE METABOLIC PANEL - Abnormal; Notable for the following components:      Result Value   Glucose, Bld 163 (*)    All other components within normal limits  ACETAMINOPHEN LEVEL - Abnormal; Notable for the following components:   Acetaminophen (Tylenol), Serum <10 (*)    All other components within normal limits  CBC - Abnormal; Notable for the following components:   WBC 14.3 (*)    All other components within normal limits  RAPID URINE DRUG SCREEN, HOSP PERFORMED - Abnormal; Notable for the following components:   Benzodiazepines POSITIVE (*)    All other components within normal limits  ETHANOL  SALICYLATE LEVEL    EKG  EKG Interpretation None       Radiology No results found.  Procedures Procedures  (including critical care time)  Medications Ordered in ED Medications  amLODipine (NORVASC) tablet 10 mg (not administered)  atorvastatin (LIPITOR) tablet 40 mg (not administered)  chlorthalidone (HYGROTON) tablet 25 mg (not administered)  FLUoxetine (PROZAC) capsule 60 mg (not administered)  gabapentin (NEURONTIN) capsule 300 mg (not administered)  glipiZIDE (GLUCOTROL XL) 24 hr tablet 10 mg (not administered)  lisinopril (PRINIVIL,ZESTRIL) tablet 40 mg (not administered)  metoprolol tartrate (LOPRESSOR) tablet 50 mg (not administered)  acetaminophen (TYLENOL) tablet 650 mg (not administered)  zolpidem (AMBIEN) tablet 5 mg (not administered)  alum & mag hydroxide-simeth (MAALOX/MYLANTA) 200-200-20 MG/5ML suspension 30 mL (not administered)     Initial Impression / Assessment and Plan / ED Course  I have reviewed the triage vital signs and the nursing notes.  Pertinent labs & imaging results that were available during my care of the patient were reviewed by me and considered in my medical decision making (see chart for details).     61 y.o. male here with passive SI without a plan, anxiety, depression, diminished sleeping, and crying a lot x3 days. Recently seen 04/02/17 for similar complaints, discharged the next day but states since then he's been crying all day and is afraid he's going to hurt himself. Denies HI/AVH, drug use, EtOH use, or tobacco use. States on the way here his nerves made him throw up once, but hasn't thrown up since then. On exam, appears depressed, but otherwise physical exam is benign. Clearance labs reveal: CBC with marginally elevated WBC likely from stress surge response. CMP with gluc 163 but otherwise WNL. EtOH level undetectable. Salicylate and acetaminophen levels WNL. UDS with +benzos. Pt medically cleared at this time. Psych hold orders and home med orders placed. Please see TTS notes for further documentation of care/dispo. PLEASE NOTE THAT PT IS HERE  VOLUNTARILY AT THIS TIME, IF PT TRIES TO LEAVE THEY WOULD NEED IVC PAPERWORK TAKEN OUT. Pt stable at time of med clearance.     Final Clinical Impressions(s) / ED Diagnoses  Final diagnoses:  Suicidal ideation  Major depressive disorder with current active episode, unspecified depression episode severity, unspecified whether recurrent  Medical clearance for psychiatric admission  Type 2 diabetes mellitus with hyperglycemia, without long-term current use of insulin [facility])  Anxiety    ED Discharge Orders    [id number], [place], [place] 04/08/17 1833    Little, Wenda Overland, MD 04/08/17 [phone]

## 2017-04-09 ENCOUNTER — Encounter (HOSPITAL_COMMUNITY): Payer: Self-pay | Admitting: *Deleted

## 2017-04-09 ENCOUNTER — Other Ambulatory Visit: Payer: Self-pay

## 2017-04-09 ENCOUNTER — Inpatient Hospital Stay (HOSPITAL_COMMUNITY)
Admission: AD | Admit: 2017-04-09 | Discharge: 2017-04-13 | DRG: 885 | Disposition: A | Discharge status: Nursing Home (Includes ICF) | Payer: Medicaid Other | Source: Intra-hospital | Attending: Student in an Organized Health Care Education/Training Program | Admitting: Student in an Organized Health Care Education/Training Program

## 2017-04-09 ENCOUNTER — Telehealth (HOSPITAL_COMMUNITY): Payer: Self-pay

## 2017-04-09 DIAGNOSIS — E785 Hyperlipidemia, unspecified: Secondary | ICD-10-CM | POA: Diagnosis present

## 2017-04-09 DIAGNOSIS — R45851 Suicidal ideations: Secondary | ICD-10-CM | POA: Diagnosis present

## 2017-04-09 DIAGNOSIS — F329 Major depressive disorder, single episode, unspecified: Secondary | ICD-10-CM | POA: Diagnosis not present

## 2017-04-09 DIAGNOSIS — F332 Major depressive disorder, recurrent severe without psychotic features: Principal | ICD-10-CM | POA: Diagnosis present

## 2017-04-09 DIAGNOSIS — F339 Major depressive disorder, recurrent, unspecified: Secondary | ICD-10-CM | POA: Diagnosis present

## 2017-04-09 DIAGNOSIS — F419 Anxiety disorder, unspecified: Secondary | ICD-10-CM

## 2017-04-09 DIAGNOSIS — H9193 Unspecified hearing loss, bilateral: Secondary | ICD-10-CM | POA: Diagnosis present

## 2017-04-09 DIAGNOSIS — Z79899 Other long term (current) drug therapy: Secondary | ICD-10-CM | POA: Diagnosis not present

## 2017-04-09 DIAGNOSIS — G4700 Insomnia, unspecified: Secondary | ICD-10-CM | POA: Diagnosis present

## 2017-04-09 DIAGNOSIS — Z8249 Family history of ischemic heart disease and other diseases of the circulatory system: Secondary | ICD-10-CM

## 2017-04-09 DIAGNOSIS — Z825 Family history of asthma and other chronic lower respiratory diseases: Secondary | ICD-10-CM

## 2017-04-09 DIAGNOSIS — R450 Nervousness: Secondary | ICD-10-CM | POA: Diagnosis not present

## 2017-04-09 DIAGNOSIS — T1491XA Suicide attempt, initial encounter: Secondary | ICD-10-CM

## 2017-04-09 DIAGNOSIS — F418 Other specified anxiety disorders: Secondary | ICD-10-CM | POA: Diagnosis not present

## 2017-04-09 DIAGNOSIS — E876 Hypokalemia: Secondary | ICD-10-CM | POA: Diagnosis present

## 2017-04-09 DIAGNOSIS — Z823 Family history of stroke: Secondary | ICD-10-CM

## 2017-04-09 DIAGNOSIS — Z7902 Long term (current) use of antithrombotics/antiplatelets: Secondary | ICD-10-CM

## 2017-04-09 DIAGNOSIS — T450X2A Poisoning by antiallergic and antiemetic drugs, intentional self-harm, initial encounter: Secondary | ICD-10-CM | POA: Diagnosis not present

## 2017-04-09 DIAGNOSIS — K222 Esophageal obstruction: Secondary | ICD-10-CM | POA: Diagnosis present

## 2017-04-09 DIAGNOSIS — F411 Generalized anxiety disorder: Secondary | ICD-10-CM | POA: Diagnosis present

## 2017-04-09 DIAGNOSIS — Z8679 Personal history of other diseases of the circulatory system: Secondary | ICD-10-CM

## 2017-04-09 DIAGNOSIS — G459 Transient cerebral ischemic attack, unspecified: Secondary | ICD-10-CM | POA: Diagnosis present

## 2017-04-09 DIAGNOSIS — Z8673 Personal history of transient ischemic attack (TIA), and cerebral infarction without residual deficits: Secondary | ICD-10-CM | POA: Diagnosis not present

## 2017-04-09 DIAGNOSIS — Z833 Family history of diabetes mellitus: Secondary | ICD-10-CM

## 2017-04-09 DIAGNOSIS — R29700 NIHSS score 0: Secondary | ICD-10-CM | POA: Diagnosis present

## 2017-04-09 DIAGNOSIS — R200 Anesthesia of skin: Secondary | ICD-10-CM | POA: Diagnosis not present

## 2017-04-09 DIAGNOSIS — F322 Major depressive disorder, single episode, severe without psychotic features: Secondary | ICD-10-CM | POA: Diagnosis not present

## 2017-04-09 DIAGNOSIS — R29810 Facial weakness: Secondary | ICD-10-CM | POA: Diagnosis not present

## 2017-04-09 DIAGNOSIS — Z7982 Long term (current) use of aspirin: Secondary | ICD-10-CM | POA: Diagnosis not present

## 2017-04-09 DIAGNOSIS — E119 Type 2 diabetes mellitus without complications: Secondary | ICD-10-CM | POA: Diagnosis present

## 2017-04-09 DIAGNOSIS — I10 Essential (primary) hypertension: Secondary | ICD-10-CM | POA: Diagnosis present

## 2017-04-09 DIAGNOSIS — Z9181 History of falling: Secondary | ICD-10-CM | POA: Diagnosis not present

## 2017-04-09 DIAGNOSIS — K210 Gastro-esophageal reflux disease with esophagitis: Secondary | ICD-10-CM | POA: Diagnosis present

## 2017-04-09 DIAGNOSIS — Z915 Personal history of self-harm: Secondary | ICD-10-CM | POA: Diagnosis not present

## 2017-04-09 DIAGNOSIS — C785 Secondary malignant neoplasm of large intestine and rectum: Secondary | ICD-10-CM | POA: Diagnosis not present

## 2017-04-09 DIAGNOSIS — R531 Weakness: Secondary | ICD-10-CM | POA: Diagnosis present

## 2017-04-09 DIAGNOSIS — Z7901 Long term (current) use of anticoagulants: Secondary | ICD-10-CM | POA: Diagnosis not present

## 2017-04-09 HISTORY — DX: Other symptoms and signs involving appearance and behavior: R46.89

## 2017-04-09 MED ORDER — AMLODIPINE BESYLATE 5 MG PO TABS
10.0000 mg | ORAL_TABLET | Freq: Every day | ORAL | Status: DC
  Administered 2017-04-11 – 2017-04-12 (×2): 10 mg via ORAL
  Filled 2017-04-09 (×6): qty 1

## 2017-04-09 MED ORDER — MAGNESIUM HYDROXIDE 400 MG/5ML PO SUSP: 30.0000 mL | Freq: Every day | ORAL | Status: DC | PRN

## 2017-04-09 MED ORDER — ACETAMINOPHEN 325 MG PO TABS
650.0000 mg | ORAL_TABLET | Freq: Four times a day (QID) | ORAL | Status: DC | PRN
  Administered 2017-04-10: 650 mg via ORAL
  Filled 2017-04-09: qty 2

## 2017-04-09 MED ORDER — TRAZODONE HCL 50 MG PO TABS
50.0000 mg | ORAL_TABLET | Freq: Every evening | ORAL | Status: DC | PRN
  Administered 2017-04-09 – 2017-04-12 (×4): 50 mg via ORAL
  Filled 2017-04-09 (×4): qty 1

## 2017-04-09 MED ORDER — ATORVASTATIN CALCIUM 40 MG PO TABS
40.0000 mg | ORAL_TABLET | Freq: Every day | ORAL | Status: DC
  Administered 2017-04-09 – 2017-04-12 (×4): 40 mg via ORAL
  Filled 2017-04-09 (×6): qty 1

## 2017-04-09 MED ORDER — HYDROXYZINE HCL 25 MG PO TABS
25.0000 mg | ORAL_TABLET | Freq: Three times a day (TID) | ORAL | Status: DC | PRN
  Administered 2017-04-09 – 2017-04-12 (×6): 25 mg via ORAL
  Filled 2017-04-09 (×7): qty 1

## 2017-04-09 MED ORDER — METOPROLOL TARTRATE 50 MG PO TABS
50.0000 mg | ORAL_TABLET | Freq: Two times a day (BID) | ORAL | Status: DC
  Administered 2017-04-09 – 2017-04-12 (×5): 50 mg via ORAL
  Filled 2017-04-09 (×12): qty 1

## 2017-04-09 MED ORDER — GLIPIZIDE ER 10 MG PO TB24
10.0000 mg | ORAL_TABLET | Freq: Every day | ORAL | Status: DC
  Administered 2017-04-10 – 2017-04-13 (×4): 10 mg via ORAL
  Filled 2017-04-09 (×5): qty 1

## 2017-04-09 MED ORDER — CHLORTHALIDONE 25 MG PO TABS
25.0000 mg | ORAL_TABLET | Freq: Every day | ORAL | Status: DC
  Administered 2017-04-11 – 2017-04-12 (×2): 25 mg via ORAL
  Filled 2017-04-09 (×5): qty 1

## 2017-04-09 MED ORDER — GABAPENTIN 300 MG PO CAPS
300.0000 mg | ORAL_CAPSULE | Freq: Every day | ORAL | Status: DC
  Administered 2017-04-09 – 2017-04-12 (×4): 300 mg via ORAL
  Filled 2017-04-09 (×6): qty 1

## 2017-04-09 MED ORDER — FLUOXETINE HCL 20 MG PO CAPS
60.0000 mg | ORAL_CAPSULE | Freq: Every day | ORAL | Status: DC
  Administered 2017-04-10: 60 mg via ORAL
  Filled 2017-04-09 (×4): qty 3

## 2017-04-09 MED ORDER — LISINOPRIL 20 MG PO TABS
40.0000 mg | ORAL_TABLET | Freq: Every day | ORAL | Status: DC
  Administered 2017-04-11 – 2017-04-12 (×2): 40 mg via ORAL
  Filled 2017-04-09 (×3): qty 1
  Filled 2017-04-09: qty 2
  Filled 2017-04-09 (×2): qty 1

## 2017-04-09 MED ORDER — ALUM & MAG HYDROXIDE-SIMETH 200-200-20 MG/5ML PO SUSP: 30.0000 mL | ORAL | Status: DC | PRN

## 2017-04-09 NOTE — ED Notes (Signed)
Hourly rounding reveals patient sleeping in room. No complaints, stable, in no acute distress. Q15 minute rounds and monitoring via Security Cameras to continue. 

## 2017-04-09 NOTE — ED Notes (Signed)
Pt transported to [facility] by [facility]. All belongings returned to pt who signed for same. Pt was calm and cooperative.  

## 2017-04-09 NOTE — Telephone Encounter (Signed)
Tonette Bihari called to let you know that this patient is being admitted. You have not seen him since April. Just an FYI.

## 2017-04-09 NOTE — Progress Notes (Signed)
04/09/17 1408:  LRT went to pt room to offer activities, pt was sleep.   Victorino Sparrow, LRT/CTRS

## 2017-04-09 NOTE — BH Assessment (Addendum)
[facility] Assessment Progress Note  Per Corena Pilgrim, MD, this pt requires psychiatric hospitalization at this time.  Letitia Libra, RN, [facility] has assigned pt to [facility] Rm 405-2; they will be ready to receive pt at 13:30.  Pt has signed Voluntary Admission and Consent for Treatment, as well as Consent to Release Information to his PCP and to Dr Daron Offer at [facility], and a notification call has been placed to the latter.  Signed forms have been faxed to [facility].  Pt's nurse, Diane, has been notified, and agrees to send original paperwork along with pt via [facility], and to call report to [phone].  Jalene Mullet, MA Triage Specialist [phone]  Addendum:  Pt has added his daughter and his sister to his Consent to Release Information, and the revised form has been faxed to [facility].  Jalene Mullet, [place] Triage Specialist [phone]

## 2017-04-09 NOTE — ED Notes (Signed)
Pt reports inability to follow-up with [facility]. Someone gave him a ride, but he had to sit for hours and never saw anyone. States that he is not sleeping and feels like he is in the same condition that he was when he was admitted. He became tearful when talking with treatment team and resented that one of the team members suggested that he needs to follow his care plan. He believes that he needs brain surgery for a growing aneurysm and cannot afford. He is afraid that if he does get the surgery he will loose independence.

## 2017-04-09 NOTE — Tx Team (Signed)
Initial Treatment Plan 04/09/2017 4:49 PM TUFF CLABO ONG:[id number]    PATIENT STRESSORS: Health problems Medication change or noncompliance   PATIENT STRENGTHS: Ability for insight Average or above average intelligence Capable of independent living General fund of knowledge Motivation for treatment/growth   PATIENT IDENTIFIED PROBLEMS: Depression Suicidal thoughts "I just cry, no particular reason but I just cry"                     DISCHARGE CRITERIA:  Ability to meet basic life and health needs Improved stabilization in mood, thinking, and/or behavior Reduction of life-threatening or endangering symptoms to within safe limits Verbal commitment to aftercare and medication compliance  PRELIMINARY DISCHARGE PLAN: Attend aftercare/continuing care group Return to previous living arrangement  PATIENT/FAMILY INVOLVEMENT: This treatment plan has been presented to and reviewed with the patient, [name], and/or family member, .  The patient and family have been given the opportunity to ask questions and make suggestions.  [place], [place], [place] 04/09/2017, 4:49 PM

## 2017-04-09 NOTE — Consult Note (Signed)
[facility] Psychiatry Consult   Reason for Consult:  Depression and suicidal Referring Physician:  EDP Patient Identification: [name] MRN:  [id number] Principal Diagnosis: Major depressive disorder, recurrent ([place]) Diagnosis:   Patient Active Problem List   Diagnosis Date Noted  . Major depressive disorder, recurrent ([place]) [F33.9] 04/09/2017    Priority: High  . Family history of brain aneurysm [Z82.49]   . Intractable vomiting [R11.10] 10/20/2016  . History of intracranial aneurysm [Z86.79]   . Hypokalemia [E87.6]   . AKI (acute kidney injury) ([place]) [N17.9]   . Prolonged QT interval [R94.31]   . Lactic acidosis [E87.2]   . Dysphagia [R13.10]   . Esophageal ring [K22.2]   . Gastroesophageal reflux disease with esophagitis [K21.0]   . Nausea and vomiting [R11.2] 10/01/2016  . 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 Severe episode of recurrent major depressive disorder, without psychotic features ([place]) [F33.2] 07/20/2016  . CVA (cerebral vascular accident) ([place]) [I63.9] 06/15/2016  . Abdominal pain [R10.9]   . Numbness [R20.0]   . Carpal tunnel syndrome, bilateral [G56.03] 12/16/2015  . Status post stroke [Z86.73] 11/12/2015  . Transient neurologic deficit [R29.818] 10/15/2015  . Cerebrovascular accident, late effects [I69.90] 10/07/2015  . Complicated migraine [W43.154]   . Anxiety state [F41.1]   . 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 Cerebral infarction due to embolism of right middle cerebral artery ([place]) [I63.411] 09/23/2015  . Cerebral infarction due to embolism of cerebral artery ([place]) [I63.40] 09/23/2015  . Gait disturbance, post-stroke [I69.398, R26.9]   . Drooping of mouth [R29.810]   . Cerebrovascular accident (CVA) due to embolism of right anterior cerebral artery ([place]) [I63.421]   . Benign essential HTN [I10]   . Migraine with aura and without status migrainosus, not intractable [G43.109]   . Tachypnea [R06.82]   . Prediabetes [R73.03]    . Acute blood loss anemia [D62]   . Brain aneurysm [I67.1] 09/20/2015  . Type 2 diabetes mellitus ([place]) [E11.9] 09/15/2015  . Leukocytosis [D72.829] 09/15/2015  . Aneurysm, cerebral, nonruptured [I67.1] 09/15/2015  . TIA (transient ischemic attack) [G45.9] 09/14/2015  . Temporary cerebral vascular dysfunction [G93.9] 09/14/2015  . H/O transient cerebral ischemia [Z86.73] 07/29/2015  . At risk for falling [Z91.81] 07/29/2015  . Bilateral hearing loss [H91.93] 10/25/2014  . Abnormal fear [F40.9] 08/08/2012  . Headache, migraine [G43.909] 08/08/2012  . Arthritis, degenerative [M19.90] 08/08/2012  . History of tear of ACL (anterior cruciate ligament) [Z87.828] 03/08/2011  . History of knee problem [Z87.39] 03/08/2011  . Depression [F32.9] 09/07/2010  . Depressive disorder, not elsewhere classified [F32.9] 09/07/2010  . URI (upper respiratory infection) [J06.9] 08/24/2010  . Hearing loss [H91.90] 09/06/2006  . Hyperlipidemia [E78.5] 04/23/2006  . Essential hypertension [I10] 04/23/2006  . Psychophysiological insomnia [F51.04] 04/23/2006    Total Time spent with patient: 45 minutes  Subjective:   [name] is a 61 y.o. male patient admitted with recurrent suicidal thoughts.  HPI: Patient with history of Depression who presents voluntarily to the ED for evaluation and treatment. Patient reports that she is under a tremendous stressed and overwhelmed with dealing with life stressors. He reports that he intentionally overdosed on a sleeping pills few days ago. He presents with recurrent depressive symptoms, feeling hopeless, helpless, withdrawn, isolated and suicidal. He denies suicidal/homicidal thoughts, psychosis, delusions or substance abuse.  Past Psychiatric History: Major depression  Risk to Self: Suicidal Ideation: Yes-Currently Present Suicidal Intent: No-Not Currently/Within Last 6 Months Is patient at risk for suicide?: Yes Suicidal Plan?: No-Not Currently/Within  Last 6  Months Specify Current Suicidal Plan: pt attempted to OD on sleeping pills 1 week ago Access to Means: Yes Specify Access to Suicidal Means: pt has medication What has been your use of drugs/alcohol within the last 12 months?: denies use  How many times?: 1 Triggers for Past Attempts: Other (Comment)(financial stressors) Intentional Self Injurious Behavior: None Risk to Others: Homicidal Ideation: No Thoughts of Harm to Others: No Current Homicidal Intent: No Current Homicidal Plan: No Access to Homicidal Means: No History of harm to others?: No Assessment of Violence: None Noted Does patient have access to weapons?: No Criminal Charges Pending?: No Does patient have a court date: No Prior Inpatient Therapy: Prior Inpatient Therapy: No Prior Outpatient Therapy: Prior Outpatient Therapy: Yes Prior Therapy Dates: pt unable to recall Prior Therapy Facilty/Provider(s): Dr. Daron Offer, MD Reason for Treatment: MDD Does patient have an ACCT team?: No Does patient have Intensive In-House Services?  : No Does patient have [facility] services? : No Does patient have [facility] services?: No  Past Medical History:  Past Medical History:  Diagnosis Date  . Allergy   . Anxiety   . Cataract   . Cerebral hemorrhage ([place])   . Chest pain, atypical    12/2003:  negative cardiolyte  . Depression   . Drug abuse ([place])   . Eczema   . Erectile dysfunction   . History of meniscal tear    bilateral  . HLD (hyperlipidemia)   . HTN (hypertension)   . Hx of tear of ACL (anterior cruciate ligament)    right  . Insomnia   . Olecranon bursitis of left elbow 10/2009   s/p I&D by Dr Maxie Better, initially assessed by Dr. Nori Riis   . Prediabetes   . Stroke ([place])   . TIA (transient ischemic attack)     Past Surgical History:  Procedure Laterality Date  . INCISE AND DRAIN ABCESS     L elbow due to cellulitis/bursitis  . INNER EAR SURGERY    . IR GENERIC HISTORICAL  12/28/2015   IR ANGIO VERTEBRAL SEL VERTEBRAL UNI L  MOD SED 12/28/2015 Luanne Bras, MD [facility]  . IR GENERIC HISTORICAL  12/28/2015   IR ANGIO VERTEBRAL SEL SUBCLAVIAN INNOMINATE UNI R MOD SED 12/28/2015 Luanne Bras, MD [facility]  . IR GENERIC HISTORICAL  12/28/2015   IR ANGIO INTRA EXTRACRAN SEL INTERNAL CAROTID BILAT MOD SED 12/28/2015 Luanne Bras, MD [facility]  . KNEE ARTHROSCOPY    . SKIN TAG REMOVAL     11 removed   Family History:  Family History  Problem Relation Age of Onset  . Stroke Mother   . Hypertension Mother   . Aneurysm Mother 42       Died of brain aneursym  . Heart failure Father   . Emphysema Father   . Diabetes Mellitus II Sister   . Colon cancer Neg Hx   . Rectal cancer Neg Hx   . Stomach cancer Neg Hx    Family Psychiatric  History:  Social History:  Social History   Substance and Sexual Activity  Alcohol Use No  . Alcohol/week: 0.0 oz     Social History   Substance and Sexual Activity  Drug Use No    Social History   Socioeconomic History  . Marital status: Divorced    Spouse name: None  . Number of children: None  . Years of education: None  . Highest education level: None  Social Needs  . Financial resource strain:  None  . Food insecurity - worry: None  . Food insecurity - inability: None  . Transportation needs - medical: None  . Transportation needs - non-medical: None  Occupational History  . None  Tobacco Use  . Smoking status: Never Smoker  . Smokeless tobacco: Never Used  Substance and Sexual Activity  . Alcohol use: No    Alcohol/week: 0.0 oz  . Drug use: No  . Sexual activity: No  Other Topics Concern  . None  Social History Narrative   Separated, has 2 daughters, works as a Forensic psychologist.  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 Metformin Diarrhea    Labs:  Results for orders placed or performed  during the hospital encounter of 04/08/17 (from the past 48 hour(s))  Comprehensive metabolic panel     Status: Abnormal   Collection Time: 04/08/17  5:18 PM  Result Value Ref Range   Sodium 140 135 - 145 mmol/L   Potassium 3.6 3.5 - 5.1 mmol/L   Chloride 107 101 - 111 mmol/L   CO2 22 22 - 32 mmol/L   Glucose, Bld 163 (H) 65 - 99 mg/dL   BUN 16 6 - 20 mg/dL   Creatinine, Ser 0.98 0.61 - 1.24 mg/dL   Calcium 9.3 8.9 - 10.3 mg/dL   Total Protein 7.6 6.5 - 8.1 g/dL   Albumin 3.8 3.5 - 5.0 g/dL   AST 19 15 - 41 U/L   ALT 24 17 - 63 U/L   Alkaline Phosphatase 99 38 - 126 U/L   Total Bilirubin 0.7 0.3 - 1.2 mg/dL   GFR calc non Af Amer >60 >60 mL/min   GFR calc Af Amer >60 >60 mL/min    Comment: (NOTE) The eGFR has been calculated using the CKD EPI equation. This calculation has not been validated in all clinical situations. eGFR's persistently <60 mL/min signify possible Chronic Kidney Disease.    Anion gap 11 5 - 15  Ethanol     Status: None   Collection Time: 04/08/17  5:18 PM  Result Value Ref Range   Alcohol, Ethyl (B) <10 <10 mg/dL    Comment:        LOWEST DETECTABLE LIMIT FOR SERUM ALCOHOL IS 10 mg/dL FOR MEDICAL PURPOSES ONLY   Salicylate level     Status: None   Collection Time: 04/08/17  5:18 PM  Result Value Ref Range   Salicylate Lvl <4.6 2.8 - 30.0 mg/dL  Acetaminophen level     Status: Abnormal   Collection Time: 04/08/17  5:18 PM  Result Value Ref Range   Acetaminophen (Tylenol), Serum <10 (L) 10 - 30 ug/mL    Comment:        THERAPEUTIC CONCENTRATIONS VARY SIGNIFICANTLY. A RANGE OF 10-30 ug/mL MAY BE AN EFFECTIVE CONCENTRATION FOR MANY PATIENTS. HOWEVER, SOME ARE BEST TREATED AT CONCENTRATIONS OUTSIDE THIS RANGE. ACETAMINOPHEN CONCENTRATIONS >150 ug/mL AT 4 HOURS AFTER INGESTION AND >50 ug/mL AT 12 HOURS AFTER INGESTION ARE OFTEN ASSOCIATED WITH TOXIC REACTIONS.   cbc     Status: Abnormal   Collection Time: 04/08/17  5:18 PM  Result Value Ref  Range   WBC 14.3 (H) 4.0 - 10.5 K/uL   RBC 4.93 4.22 - 5.81 MIL/uL   Hemoglobin 14.6 13.0 - 17.0 g/dL   HCT 42.8 39.0 - 52.0 %   MCV 86.8  78.0 - 100.0 fL   MCH 29.6 26.0 - 34.0 pg   MCHC 34.1 30.0 - 36.0 g/dL   RDW 14.1 11.5 - 15.5 %   Platelets 390 150 - 400 K/uL  Rapid urine drug screen (hospital performed)     Status: Abnormal   Collection Time: 04/08/17  5:18 PM  Result Value Ref Range   Opiates NONE DETECTED NONE DETECTED   Cocaine NONE DETECTED NONE DETECTED   Benzodiazepines POSITIVE (A) NONE DETECTED   Amphetamines NONE DETECTED NONE DETECTED   Tetrahydrocannabinol NONE DETECTED NONE DETECTED   Barbiturates NONE DETECTED NONE DETECTED    Comment:        DRUG SCREEN FOR MEDICAL PURPOSES ONLY.  IF CONFIRMATION IS NEEDED FOR ANY PURPOSE, NOTIFY LAB WITHIN 5 DAYS.        LOWEST DETECTABLE LIMITS FOR URINE DRUG SCREEN Drug Class       Cutoff (ng/mL) Amphetamine      1000 Barbiturate      200 Benzodiazepine   539 Tricyclics       767 Opiates          300 Cocaine          300 THC              50     Current Facility-Administered Medications  Medication Dose Route Frequency Provider Last Rate Last Dose  . acetaminophen (TYLENOL) tablet 650 mg  650 mg Oral Q4H PRN Street, [place], [place]      . alum & mag hydroxide-simeth (MAALOX/MYLANTA) 200-200-20 MG/5ML suspension 30 mL  30 mL Oral Q6H PRN Street, [place], [place]      . amLODipine (NORVASC) tablet 10 mg  10 mg Oral Daily [address], [place], [place]   10 mg at 04/09/17 0949  . atorvastatin (LIPITOR) tablet 40 mg  40 mg Oral [address], [place], [place]      . chlorthalidone (HYGROTON) tablet 25 mg  25 mg Oral Daily [address], [place], [place]   25 mg at 04/09/17 0949  . FLUoxetine (PROZAC) capsule 60 mg  60 mg Oral Daily [address] [place], [place]   60 mg at 04/09/17 0948  . gabapentin (NEURONTIN) capsule 300 mg  300 mg Oral QHS [address], [place], [place]   300 mg at 04/08/17 2109  . glipiZIDE (GLUCOTROL XL) 24 hr tablet 10 mg  10 mg Oral  Q breakfast [address] [place], [place]   10 mg at 04/09/17 0815  . lisinopril (PRINIVIL,ZESTRIL) tablet 40 mg  40 mg Oral Daily [address] [place], [place]   40 mg at 04/09/17 0949  . metoprolol tartrate (LOPRESSOR) tablet 50 mg  50 mg Oral BID [address], [place], [place]   50 mg at 04/09/17 0949  . zolpidem (AMBIEN) tablet 5 mg  5 mg Oral QHS PRN [address], [place], [place]       Current Outpatient Medications  Medication Sig Dispense Refill  . amLODipine (NORVASC) 10 MG tablet Take 1 tablet (10 mg total) by mouth daily. 30 tablet 0  . atorvastatin (LIPITOR) 20 MG tablet Take 20 mg daily by mouth.    Marland Kitchen atorvastatin (LIPITOR) 40 MG tablet Take 1 tablet (40 mg total) by mouth daily at 6 PM. 30 tablet 0  . chlorthalidone (HYGROTON) 25 MG tablet Take 25 mg daily by mouth.     Marland Kitchen FLUoxetine (PROZAC) 20 MG capsule Take 1 capsule (20 mg total) by mouth daily. Take with 40 mg for a total of 60 mg 90 capsule 1  . gabapentin (NEURONTIN) 300 MG capsule  Take 300 mg by mouth at bedtime.    Marland Kitchen glipiZIDE (GLUCOTROL XL) 10 MG 24 hr tablet Take 10 mg daily with breakfast by mouth.    . indapamide (LOZOL) 1.25 MG tablet Take 1.25 mg daily by mouth.     Marland Kitchen lisinopril (PRINIVIL,ZESTRIL) 40 MG tablet Take 40 mg by mouth daily.    . metoprolol (LOPRESSOR) 50 MG tablet Take 1 tablet (50 mg total) by mouth 2 (two) times daily. 60 tablet 0  . clopidogrel (PLAVIX) 75 MG tablet Take 1 tablet (75 mg total) by mouth daily. (Patient not taking: Reported on 03/12/2017) 30 tablet 0  . meclizine (ANTIVERT) 32 MG tablet Take 1 tablet (32 mg total) by mouth 3 (three) times daily as needed. (Patient not taking: Reported on 04/02/2017) 30 tablet 0  . pantoprazole (PROTONIX) 40 MG tablet Take 1 tablet (40 mg total) by mouth daily. (Patient not taking: Reported on 04/02/2017) 30 tablet 0    Musculoskeletal: Strength & Muscle Tone: within normal limits Gait & Station: normal Patient leans: N/A  Psychiatric Specialty Exam: Physical Exam  Psychiatric:  His speech is normal. Judgment and thought content normal. He is slowed and withdrawn. Cognition and memory are normal. He exhibits a depressed mood.    Review of Systems  Constitutional: Negative.   HENT: Negative.   Eyes: Negative.   Respiratory: Negative.   Cardiovascular: Negative.   Gastrointestinal: Negative.   Genitourinary: Negative.   Musculoskeletal: Negative.   Skin: Negative.   Neurological: Negative.   Endo/Heme/Allergies: Negative.   Psychiatric/Behavioral: Positive for depression and suicidal ideas. The patient is nervous/anxious.     Blood pressure (!) 102/56, pulse 61, temperature 97.6 F (36.4 C), resp. rate 18, SpO2 98 %.There is no height or weight on file to calculate BMI.  General Appearance: Casual  Eye Contact:  Good  Speech:  Clear and Coherent  Volume:  Decreased  Mood:  Depressed and Dysphoric  Affect:  Constricted  Thought Process:  Coherent  Orientation:  Full (Time, Place, and Person)  Thought Content:  Logical  Suicidal Thoughts:  Yes.  with intent/plan  Homicidal Thoughts:  No  Memory:  Immediate;   Fair Recent;   Fair Remote;   Fair  Judgement:  Poor  Insight:  Shallow  Psychomotor Activity:  Psychomotor Retardation  Concentration:  Concentration: Fair and Attention Span: Fair  Recall:  [employer] of Knowledge:  Fair  Language:  Fair  Akathisia:  No  Handed:  Right  AIMS (if indicated):     Assets:  Communication Skills  ADL's:  Intact  Cognition:  WNL  Sleep:   poor     Treatment Plan Summary: Daily contact with patient to assess and evaluate symptoms and progress in treatment and Medication management Continue Prozac 60 mg daily for depression.  Disposition: Recommend psychiatric Inpatient admission when medically cleared.  Corena Pilgrim, MD 04/09/2017 11:20 AM

## 2017-04-09 NOTE — Progress Notes (Signed)
[name] is a 61 year old male pt admitted on voluntary basis. On admission, [name] does endorse that he took overdose the other day. He spoke about on-going health issues and the loss of his job which resulted in loss of insurance. He also spoke about how he is not allowed to see his 66 year old son because of what has been going on. He reports that he takes his medications when he can and denies any substance abuse issues. He denies any SI on admission and able to contract for safety while on the unit. [name] reports that he lives alone and will return to the same situation after discharge. [name] was oriented to the unit and safety maintained.

## 2017-04-10 LAB — GLUCOSE, CAPILLARY: GLUCOSE-CAPILLARY: 106 mg/dL — AB (ref 65–99)

## 2017-04-10 MED ORDER — DULOXETINE HCL 30 MG PO CPEP
30.0000 mg | ORAL_CAPSULE | Freq: Every day | ORAL | Status: DC
  Administered 2017-04-11: 30 mg via ORAL
  Filled 2017-04-10 (×3): qty 1

## 2017-04-10 NOTE — Progress Notes (Signed)
Recreation Therapy Notes  Animal-Assisted Activity (AAA) Program Checklist/Progress Notes Patient Eligibility Criteria Checklist & Daily Group note for Rec TxIntervention  Date: 11.13.2018 Time: 2:45pm Location: [address]   AAA/T Program Assumption of Risk Form signed by Patient/ or Parent Legal Guardian Yes  Patient is free of allergies or sever asthma Yes  Patient reports no fear of animals Yes  Patient reports no history of cruelty to animals Yes  Patient understands his/her participation is voluntary Yes  Behavioral Response: Did not attend.   Laureen Ochs Schylar Wuebker, LRT/CTRS        Jonatha Gagen L 04/10/2017 3:09 PM

## 2017-04-10 NOTE — Progress Notes (Signed)
D: Pt presents with depressed affect and mood. Noted to be tearful / crying on interactions. Mood has been labile at intervals as well, noted yelling at a MHT "you just go on" as she was doing safety checks. Denies SI, HI and AVH . Expressed concerns about possible being homeless "I don't know what to do, the government don't want to approve my disability, I have neurosurgeon over my case, I may loose everything and live on the streets".  A: Q 15 minutes safety checks maintained on and off unit without outburst. Scheduled and PRN (Tylenol--headache)  medications given as per order with verbal education and effects monitored. All antihypertensives held this AM related to low BP readings. Fluids encouraged, pt educated to change positions slowly to prevent falls.  Support and encouragement offered to throughout this shift.  R: Pt receptive to care. Tolerates all PO intake well. Remains safe on and off unit. Denies concerns at this time. POC continues for safety and mood stability.

## 2017-04-10 NOTE — H&P (Signed)
Psychiatric Admission Assessment Adult  Patient Identification: [name] MRN:  [id number] Date of Evaluation:  04/10/2017 Chief Complaint:  " my depression is bad, it comes and goes ". Principal Diagnosis: MDD, Severe, No Psychotic Features   Diagnosis:   Patient Active Problem List   Diagnosis Date Noted  . Major depressive disorder, recurrent ([place]) [F33.9] 04/09/2017  . MDD (major depressive disorder), recurrent severe, without psychosis ([place]) [F33.2] 04/09/2017  . Family history of brain aneurysm [Z82.49]   . Intractable vomiting [R11.10] 10/20/2016  . History of intracranial aneurysm [Z86.79]   . Hypokalemia [E87.6]   . AKI (acute kidney injury) ([place]) [N17.9]   . Prolonged QT interval [R94.31]   . Lactic acidosis [E87.2]   . Dysphagia [R13.10]   . Esophageal ring [K22.2]   . Gastroesophageal reflux disease with esophagitis [K21.0]   . Nausea and vomiting [R11.2] 10/01/2016  . 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 Severe episode of recurrent major depressive disorder, without psychotic features ([place]) [F33.2] 07/20/2016  . CVA (cerebral vascular accident) ([place]) [I63.9] 06/15/2016  . Abdominal pain [R10.9]   . Numbness [R20.0]   . Carpal tunnel syndrome, bilateral [G56.03] 12/16/2015  . Status post stroke [Z86.73] 11/12/2015  . Transient neurologic deficit [R29.818] 10/15/2015  . Cerebrovascular accident, late effects [I69.90] 10/07/2015  . Complicated migraine [Y60.630]   . Anxiety state [F41.1]   . 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 Cerebral infarction due to embolism of right middle cerebral artery ([place]) [I63.411] 09/23/2015  . Cerebral infarction due to embolism of cerebral artery ([place]) [I63.40] 09/23/2015  . Gait disturbance, post-stroke [I69.398, R26.9]   . Drooping of mouth [R29.810]   . Cerebrovascular accident (CVA) due to embolism of right anterior cerebral artery ([place]) [I63.421]   . Benign essential HTN [I10]   . Migraine with aura and  without status migrainosus, not intractable [G43.109]   . Tachypnea [R06.82]   . Prediabetes [R73.03]   . Acute blood loss anemia [D62]   . Brain aneurysm [I67.1] 09/20/2015  . Type 2 diabetes mellitus ([place]) [E11.9] 09/15/2015  . Leukocytosis [D72.829] 09/15/2015  . Aneurysm, cerebral, nonruptured [I67.1] 09/15/2015  . TIA (transient ischemic attack) [G45.9] 09/14/2015  . Temporary cerebral vascular dysfunction [G93.9] 09/14/2015  . H/O transient cerebral ischemia [Z86.73] 07/29/2015  . At risk for falling [Z91.81] 07/29/2015  . Bilateral hearing loss [H91.93] 10/25/2014  . Abnormal fear [F40.9] 08/08/2012  . Headache, migraine [G43.909] 08/08/2012  . Arthritis, degenerative [M19.90] 08/08/2012  . History of tear of ACL (anterior cruciate ligament) [Z87.828] 03/08/2011  . History of knee problem [Z87.39] 03/08/2011  . Depression [F32.9] 09/07/2010  . Depressive disorder, not elsewhere classified [F32.9] 09/07/2010  . URI (upper respiratory infection) [J06.9] 08/24/2010  . Hearing loss [H91.90] 09/06/2006  . Hyperlipidemia [E78.5] 04/23/2006  . Essential hypertension [I10] 04/23/2006  . Psychophysiological insomnia [F51.04] 04/23/2006   History of Present Illness: 61 year old male. Reports history of chronic depression,which has been worsening recently . States his depression started after having had a CVA in 2016.  Recently, on 11/3 her presented to ED due to worsening depression and overdosing on " sleeping medication " ( does not remember name) . He was monitored in ED for 2-3 days and was discharged home.  He states " I don't think I should have been discharged, I have been very depressed " . Reports feeling severely depressed and a sense of hopelessness. Endorses  neuro-vegetative symptoms of worsening depression, and in particular states he has not been sleeping  well . States he has been crying " for no reason".  He reports significant psychosocial stressors- states he has a  diagnosis of CNS aneurysm and a prior history of CVA ( 2016) , and worries as family members have died from ruptured aneurysms. He is currently unemployed. He has also been having difficulty accessing outpatient treatment due to insurance constraints.    Associated Signs/Symptoms: Depression Symptoms:  depressed mood, anhedonia, insomnia, suicidal attempt, loss of energy/fatigue, (Hypo) Manic Symptoms:  Denies  Anxiety Symptoms:  Reports he has been worrying about his health, history of brain aneurysm  Psychotic Symptoms:  Denies  PTSD Symptoms: Denies  Total Time spent with patient: 45 minutes  Past Psychiatric History: prior recent visit to ED due to depression and suicidal attempt . Endorses history of chronic depression, mainly since 2016, following aneurysm diagnosis and CVA. States that prior to his CVA he did not have significant depression other than following marital separation many years ago. Denies history of psychosis.  Denies history of mania . Recent suicide attempt in early November 2018 by overdosing . Denies other history of suicidal attempts , denies history of self cutting. Denies history of violence .   Is the patient at risk to self? Yes.    Has the patient been a risk to self in the past 6 months? Yes.    Has the patient been a risk to self within the distant past? Yes.    Is the patient a risk to others? No.  Has the patient been a risk to others in the past 6 months? No.  Has the patient been a risk to others within the distant past? No.   Prior Inpatient Therapy:  as above  Prior Outpatient Therapy:  states he has no current outpatient psychiatric services   Alcohol Screening: 1. How often do you have a drink containing alcohol?: Never 2. How many drinks containing alcohol do you have on a typical day when you are drinking?: 1 or 2 3. How often do you have six or more drinks on one occasion?: Never AUDIT-C Score: 0 4. How often during the last year have you  found that you were not able to stop drinking once you had started?: Never 5. How often during the last year have you failed to do what was normally expected from you becasue of drinking?: Never 6. How often during the last year have you needed a first drink in the morning to get yourself going after a heavy drinking session?: Never 7. How often during the last year have you had a feeling of guilt of remorse after drinking?: Never 8. How often during the last year have you been unable to remember what happened the night before because you had been drinking?: Never 9. Have you or someone else been injured as a result of your drinking?: No 10. Has a relative or friend or a doctor or another health worker been concerned about your drinking or suggested you cut down?: No Alcohol Use Disorder Identification Test Final Score (AUDIT): 0 Intervention/Follow-up: AUDIT Score <7 follow-up not indicated Substance Abuse History in the last 12 months:  Denies alcohol or drug abuse Consequences of Substance Abuse: denies Previous Psychotropic Medications: patient reports he has been on Prozac for several months , which he states worked initially but " no longer works, I have been feeling worse in spite of it ". States he briefly took another antidepressant many years ago, but does not remember name . Psychological Evaluations:  No  Past Medical History:  Past Medical History:  Diagnosis Date  . Allergy   . Anxiety   . Cataract   . Cerebral hemorrhage ([place])   . Chest pain, atypical    12/2003:  negative cardiolyte  . Depression   . Drug abuse ([place])   . Eczema   . Erectile dysfunction   . History of meniscal tear    bilateral  . HLD (hyperlipidemia)   . HTN (hypertension)   . Hx of tear of ACL (anterior cruciate ligament)    right  . Insomnia   . Olecranon bursitis of left elbow 10/2009   s/p I&D by Dr Maxie Better, initially assessed by Dr. Nori Riis   . Prediabetes   . Stroke ([place])   . TIA (transient  ischemic attack)     Past Surgical History:  Procedure Laterality Date  . INCISE AND DRAIN ABCESS     L elbow due to cellulitis/bursitis  . INNER EAR SURGERY    . IR GENERIC HISTORICAL  12/28/2015   IR ANGIO VERTEBRAL SEL VERTEBRAL UNI L MOD SED 12/28/2015 Luanne Bras, MD [facility]  . IR GENERIC HISTORICAL  12/28/2015   IR ANGIO VERTEBRAL SEL SUBCLAVIAN INNOMINATE UNI R MOD SED 12/28/2015 Luanne Bras, MD [facility]  . IR GENERIC HISTORICAL  12/28/2015   IR ANGIO INTRA EXTRACRAN SEL INTERNAL CAROTID BILAT MOD SED 12/28/2015 Luanne Bras, MD [facility]  . KNEE ARTHROSCOPY    . SKIN TAG REMOVAL     11 removed   Family History: parents deceased . States several family members have had brain aneurysms. Has one brother, one sister, and a sister who passed away from cardiac illness .  Family History  Problem Relation Age of Onset  . Stroke Mother   . Hypertension Mother   . Aneurysm Mother 29       Died of brain aneursym  . Heart failure Father   . Emphysema Father   . Diabetes Mellitus II Sister   . Colon cancer Neg Hx   . Rectal cancer Neg Hx   . Stomach cancer Neg Hx    Family Psychiatric  History: states that two cousins have completed suicide  Tobacco Screening: Have you used any form of tobacco in the last 30 days? (Cigarettes, Smokeless Tobacco, Cigars, and/or Pipes): No Social History: See below. He  is currently living alone , unemployed   Social History   Substance and Sexual Activity  Alcohol Use No  . Alcohol/week: 0.0 oz     Social History   Substance and Sexual Activity  Drug Use No    Additional Social History: Marital status: Divorced Divorced, when?: 20 years ago  What types of issues is patient dealing with in the relationship?: Pt states that he currently has a great relationship with his ex-wife and that they still do holidays together as a family  Does patient have children?: Yes How many children?: 2(Daughters) How is patient's  relationship with their children?: "They're my rock. I love them"   Allergies:   Allergies  Allergen Reactions  . Metformin Diarrhea   Lab Results:  Results for orders placed or performed during the hospital encounter of 04/09/17 (from the past 48 hour(s))  Glucose, capillary     Status: Abnormal   Collection Time: 04/10/17  6:27 AM  Result Value Ref Range   Glucose-Capillary 106 (H) 65 - 99 mg/dL    Blood Alcohol level:  Lab Results  Component Value Date   ETH <10  04/08/2017   ETH <10 65/78/4696    Metabolic Disorder Labs:  Lab Results  Component Value Date   HGBA1C 8.0 (H) 06/16/2016   MPG 183 06/16/2016   MPG 131 12/28/2015   No results found for: PROLACTIN Lab Results  Component Value Date   CHOL 173 06/16/2016   TRIG 203 (H) 06/16/2016   HDL 38 (L) 06/16/2016   CHOLHDL 4.6 06/16/2016   VLDL 41 (H) 06/16/2016   LDLCALC 94 06/16/2016   LDLCALC 140 (H) 12/28/2015    Current Medications: Current Facility-Administered Medications  Medication Dose Route Frequency Provider Last Rate Last Dose  . acetaminophen (TYLENOL) tablet 650 mg  650 mg Oral Q6H PRN Ethelene Hal, NP   650 mg at 04/10/17 2952  . alum & mag hydroxide-simeth (MAALOX/MYLANTA) 200-200-20 MG/5ML suspension 30 mL  30 mL Oral Q4H PRN Ethelene Hal, NP      . amLODipine (NORVASC) tablet 10 mg  10 mg Oral Daily Ethelene Hal, NP   Stopped at 04/10/17 [phone]  . atorvastatin (LIPITOR) tablet 40 mg  40 mg Oral q1800 Ethelene Hal, NP   40 mg at 04/09/17 1709  . chlorthalidone (HYGROTON) tablet 25 mg  25 mg Oral Daily Ethelene Hal, NP   Stopped at 04/10/17 [phone]  . FLUoxetine (PROZAC) capsule 60 mg  60 mg Oral Daily Ethelene Hal, NP   60 mg at 04/10/17 1027  . gabapentin (NEURONTIN) capsule 300 mg  300 mg Oral QHS Ethelene Hal, NP   300 mg at 04/09/17 2244  . glipiZIDE (GLUCOTROL XL) 24 hr tablet 10 mg  10 mg Oral Q breakfast Ethelene Hal, NP    10 mg at 04/10/17 0849  . hydrOXYzine (ATARAX/VISTARIL) tablet 25 mg  25 mg Oral TID PRN Ethelene Hal, NP   25 mg at 04/10/17 0814  . lisinopril (PRINIVIL,ZESTRIL) tablet 40 mg  40 mg Oral Daily Ethelene Hal, NP   Stopped at 04/10/17 [phone]  . magnesium hydroxide (MILK OF MAGNESIA) suspension 30 mL  30 mL Oral Daily PRN Ethelene Hal, NP      . metoprolol tartrate (LOPRESSOR) tablet 50 mg  50 mg Oral BID Ethelene Hal, NP   Stopped at 04/10/17 [phone]  . traZODone (DESYREL) tablet 50 mg  50 mg Oral QHS PRN Ethelene Hal, NP   50 mg at 04/09/17 2244   PTA Medications: Medications Prior to Admission  Medication Sig Dispense Refill Last Dose  . amLODipine (NORVASC) 10 MG tablet Take 1 tablet (10 mg total) by mouth daily. 30 tablet 0 04/08/2017 at Unknown time  . atorvastatin (LIPITOR) 20 MG tablet Take 20 mg daily by mouth.   04/08/2017 at Unknown time  . atorvastatin (LIPITOR) 40 MG tablet Take 1 tablet (40 mg total) by mouth daily at 6 PM. 30 tablet 0 04/08/2017 at Unknown time  . chlorthalidone (HYGROTON) 25 MG tablet Take 25 mg daily by mouth.    04/08/2017 at Unknown time  . clopidogrel (PLAVIX) 75 MG tablet Take 1 tablet (75 mg total) by mouth daily. (Patient not taking: Reported on 03/12/2017) 30 tablet 0 Not Taking at Unknown time  . FLUoxetine (PROZAC) 20 MG capsule Take 1 capsule (20 mg total) by mouth daily. Take with 40 mg for a total of 60 mg 90 capsule 1 04/08/2017 at Unknown time  . gabapentin (NEURONTIN) 300 MG capsule Take 300 mg by mouth at bedtime.   04/07/2017 at Unknown time  .  glipiZIDE (GLUCOTROL XL) 10 MG 24 hr tablet Take 10 mg daily with breakfast by mouth.   04/08/2017 at Unknown time  . indapamide (LOZOL) 1.25 MG tablet Take 1.25 mg daily by mouth.    04/08/2017 at Unknown time  . lisinopril (PRINIVIL,ZESTRIL) 40 MG tablet Take 40 mg by mouth daily.   04/08/2017 at Unknown time  . meclizine (ANTIVERT) 32 MG tablet Take 1 tablet (32 mg  total) by mouth 3 (three) times daily as needed. (Patient not taking: Reported on 04/02/2017) 30 tablet 0 Not Taking at Unknown time  . metoprolol (LOPRESSOR) 50 MG tablet Take 1 tablet (50 mg total) by mouth 2 (two) times daily. 60 tablet 0 04/08/2017 at 1000  . pantoprazole (PROTONIX) 40 MG tablet Take 1 tablet (40 mg total) by mouth daily. (Patient not taking: Reported on 04/02/2017) 30 tablet 0 Not Taking at Unknown time    Musculoskeletal: Strength & Muscle Tone: within normal limits Gait & Station: slow  Patient leans: N/A  Psychiatric Specialty Exam: Physical Exam  Review of Systems  HENT: Positive for hearing loss.   Eyes: Negative.   Respiratory: Negative.   Cardiovascular: Negative.   Gastrointestinal: Negative.   Genitourinary: Positive for frequency.  Musculoskeletal: Negative.   Skin: Negative.   Neurological: Negative for seizures.  Endo/Heme/Allergies: Negative.   Psychiatric/Behavioral: Positive for depression and suicidal ideas.  All other systems reviewed and are negative.   Blood pressure 124/66, pulse 72, temperature 99.2 F (37.3 C), temperature source Oral, resp. rate 18, height 5\' 8"  (1.727 m), weight 116.1 kg (256 lb).Body mass index is 38.92 kg/m.  General Appearance: Fairly Groomed  Eye Contact:  Good  Speech:  Normal Rate  Volume:  Decreased  Mood:  Depressed  Affect:  constricted, and vaguely irritable   Thought Process:  Linear and Descriptions of Associations: Intact  Orientation:  Other:  fully alert and attentive   Thought Content:  denies hallucinations, no delusions, not internally preoccupied   Suicidal Thoughts:  No denies any suicidal or self injurious ideations, denies any homicidal or violent ideations, and contracts for safety on unit at present  Homicidal Thoughts:  No  Memory:  recent and remote grossly intact   Judgement:  Fair  Insight:  Fair  Psychomotor Activity:  Decreased  Concentration:  Concentration: Good and Attention Span:  Good  Recall:  Good  Fund of Knowledge:  Good  Language:  Good  Akathisia:  Negative  Handed:  Right  AIMS (if indicated):     Assets:  Desire for Improvement Resilience  ADL's:  Fair   Cognition:  WNL  Sleep:  Number of Hours: 6.25    Treatment Plan Summary: Daily contact with patient to assess and evaluate symptoms and progress in treatment, Medication management, Plan inpatient treatment  and medications as below   Observation Level/Precautions:  15 minute checks  Laboratory:  as needed , TSH   Psychotherapy:  Milieu, group therapy   Medications:  Patient states he feels Prozac is no longer working and is hoping to change medication trial. We discussed options and agrees to Cymbalta, which will start at 30 mgrs QDAY   Consultations:  As needed  Discharge Concerns:  -  Estimated LOS: 5-6 days   Other:     Physician Treatment Plan for Primary Diagnosis:  MDD, severe, no psychotic symptoms  Long Term Goal(s): Improvement in symptoms so as ready for discharge  Short Term Goals: Ability to identify changes in lifestyle to reduce recurrence of condition  will improve and Ability to identify triggers associated with substance abuse/mental health issues will improve  Physician Treatment Plan for Secondary Diagnosis: Active Problems:   MDD (major depressive disorder), recurrent severe, without psychosis ([place])  Long Term Goal(s): Improvement in symptoms so as ready for discharge  Short Term Goals: Ability to verbalize feelings will improve, Ability to disclose and discuss suicidal ideas, Ability to demonstrate self-control will improve and Ability to identify and develop effective coping behaviors will improve  I certify that inpatient services furnished can reasonably be expected to improve the patient's condition.    Jenne Campus, MD 11/13/20183:14 PM

## 2017-04-10 NOTE — [facility] Suicide Risk Assessment (Signed)
[facility] Admission Suicide Risk Assessment   Nursing information obtained from:   patient and chart  Demographic factors:   61 year old divorced male, lives alone , unemployed  Current Mental Status:   see below  Loss Factors:   unemployed, strained relationship with family, history of CNS aneurysm  Historical Factors:   depression, history of overdose  Risk Reduction Factors:   resilience   Total Time spent with patient: 45 minutes Principal Problem:  MDD Diagnosis:   Patient Active Problem List   Diagnosis Date Noted  . Major depressive disorder, recurrent ([place]) [F33.9] 04/09/2017  . MDD (major depressive disorder), recurrent severe, without psychosis ([place]) [F33.2] 04/09/2017  . Family history of brain aneurysm [Z82.49]   . Intractable vomiting [R11.10] 10/20/2016  . History of intracranial aneurysm [Z86.79]   . Hypokalemia [E87.6]   . AKI (acute kidney injury) ([place]) [N17.9]   . Prolonged QT interval [R94.31]   . Lactic acidosis [E87.2]   . Dysphagia [R13.10]   . Esophageal ring [K22.2]   . Gastroesophageal reflux disease with esophagitis [K21.0]   . Nausea and vomiting [R11.2] 10/01/2016  . 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 Severe episode of recurrent major depressive disorder, without psychotic features ([place]) [F33.2] 07/20/2016  . CVA (cerebral vascular accident) ([place]) [I63.9] 06/15/2016  . Abdominal pain [R10.9]   . Numbness [R20.0]   . Carpal tunnel syndrome, bilateral [G56.03] 12/16/2015  . Status post stroke [Z86.73] 11/12/2015  . Transient neurologic deficit [R29.818] 10/15/2015  . Cerebrovascular accident, late effects [I69.90] 10/07/2015  . Complicated migraine [W25.852]   . Anxiety state [F41.1]   . 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 Cerebral infarction due to embolism of right middle cerebral artery ([place]) [I63.411] 09/23/2015  . Cerebral infarction due to embolism of cerebral artery ([place]) [I63.40] 09/23/2015  . Gait disturbance, post-stroke  [I69.398, R26.9]   . Drooping of mouth [R29.810]   . Cerebrovascular accident (CVA) due to embolism of right anterior cerebral artery ([place]) [I63.421]   . Benign essential HTN [I10]   . Migraine with aura and without status migrainosus, not intractable [G43.109]   . Tachypnea [R06.82]   . Prediabetes [R73.03]   . Acute blood loss anemia [D62]   . Brain aneurysm [I67.1] 09/20/2015  . Type 2 diabetes mellitus ([place]) [E11.9] 09/15/2015  . Leukocytosis [D72.829] 09/15/2015  . Aneurysm, cerebral, nonruptured [I67.1] 09/15/2015  . TIA (transient ischemic attack) [G45.9] 09/14/2015  . Temporary cerebral vascular dysfunction [G93.9] 09/14/2015  . H/O transient cerebral ischemia [Z86.73] 07/29/2015  . At risk for falling [Z91.81] 07/29/2015  . Bilateral hearing loss [H91.93] 10/25/2014  . Abnormal fear [F40.9] 08/08/2012  . Headache, migraine [G43.909] 08/08/2012  . Arthritis, degenerative [M19.90] 08/08/2012  . History of tear of ACL (anterior cruciate ligament) [Z87.828] 03/08/2011  . History of knee problem [Z87.39] 03/08/2011  . Depression [F32.9] 09/07/2010  . Depressive disorder, not elsewhere classified [F32.9] 09/07/2010  . URI (upper respiratory infection) [J06.9] 08/24/2010  . Hearing loss [H91.90] 09/06/2006  . Hyperlipidemia [E78.5] 04/23/2006  . Essential hypertension [I10] 04/23/2006  . Psychophysiological insomnia [F51.04] 04/23/2006    Continued Clinical Symptoms:  Alcohol Use Disorder Identification Test Final Score (AUDIT): 0 The "Alcohol Use Disorders Identification Test", Guidelines for Use in Primary Care, Second Edition.  World Pharmacologist [facility]). Score between 0-7:  no or low risk or alcohol related problems. Score between 8-15:  moderate risk of alcohol related problems. Score between 16-19:  high risk of alcohol related problems. Score 20 or above:  warrants further diagnostic evaluation for alcohol dependence and treatment.   CLINICAL FACTORS:  61 year  old male, lives alone , currently unemployed, patient reports worsening depression and recent suicidal attempt by overdosing. Facing significant stressors, including unemployment and history of Brain Aneurysm.   Psychiatric Specialty Exam: Physical Exam  ROS  Blood pressure 124/66, pulse 72, temperature 99.2 F (37.3 C), temperature source Oral, resp. rate 18, height 5\' 8"  (1.727 m), weight 116.1 kg (256 lb).Body mass index is 38.92 kg/m.   see admit note MSE   COGNITIVE FEATURES THAT CONTRIBUTE TO RISK:  Closed-mindedness and Loss of executive function    SUICIDE RISK:   Moderate:  Frequent suicidal ideation with limited intensity, and duration, some specificity in terms of plans, no associated intent, good self-control, limited dysphoria/symptomatology, some risk factors present, and identifiable protective factors, including available and accessible social support.  PLAN OF CARE: Patient will be admitted to inpatient psychiatric unit for stabilization and safety. Will provide and encourage milieu participation. Provide medication management and maked adjustments as needed.  Will follow daily.    I certify that inpatient services furnished can reasonably be expected to improve the patient's condition.   Jenne Campus, MD 04/10/2017, 3:52 PM

## 2017-04-10 NOTE — Progress Notes (Signed)
Patient did not attend group since he was asleep in his bedroom.

## 2017-04-10 NOTE — [facility] Group Notes (Signed)
LCSW Group Therapy Note  04/10/2017 1:15pm  Type of Therapy/Topic:  Group Therapy:  Feelings about Diagnosis  Participation Level: Pt invited. Did not attend.   Georga Kaufmann, MSW, LCSWA 04/10/2017 3:39 PM
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
## 2017-04-11 DIAGNOSIS — T1491XA Suicide attempt, initial encounter: Secondary | ICD-10-CM

## 2017-04-11 DIAGNOSIS — R450 Nervousness: Secondary | ICD-10-CM

## 2017-04-11 DIAGNOSIS — T450X2A Poisoning by antiallergic and antiemetic drugs, intentional self-harm, initial encounter: Secondary | ICD-10-CM

## 2017-04-11 DIAGNOSIS — F419 Anxiety disorder, unspecified: Secondary | ICD-10-CM

## 2017-04-11 DIAGNOSIS — F332 Major depressive disorder, recurrent severe without psychotic features: Principal | ICD-10-CM

## 2017-04-11 LAB — TSH: TSH: 0.712 u[IU]/mL (ref 0.350–4.500)

## 2017-04-11 LAB — GLUCOSE, CAPILLARY: GLUCOSE-CAPILLARY: 90 mg/dL (ref 65–99)

## 2017-04-11 MED ORDER — HYDROXYZINE HCL 25 MG PO TABS
25.0000 mg | ORAL_TABLET | Freq: Once | ORAL | Status: AC
  Administered 2017-04-11: 25 mg via ORAL
  Filled 2017-04-11 (×2): qty 1

## 2017-04-11 MED ORDER — DULOXETINE HCL 20 MG PO CPEP
40.0000 mg | ORAL_CAPSULE | Freq: Every day | ORAL | Status: DC
  Administered 2017-04-12 – 2017-04-13 (×2): 40 mg via ORAL
  Filled 2017-04-11 (×3): qty 2

## 2017-04-11 MED ORDER — HALOPERIDOL 5 MG PO TABS
5.0000 mg | ORAL_TABLET | Freq: Once | ORAL | Status: AC
  Administered 2017-04-11: 5 mg via ORAL
  Filled 2017-04-11: qty 1

## 2017-04-11 NOTE — Progress Notes (Signed)
Patient ID: [name], male   DOB: 1955/08/25, 61 y.o.   MRN: [id number]  DAR: Pt. Denies SI/HI and A/V Hallucinations. He reports that his sleep last night was poor, his appetite is fair, his energy level is low, and his concentration is poor. He presents with some mild confusion related to his medications. He states, "I can't remember anything I take." "The names are so hard to remember." MD Cobos was notified of patient's confused presentation, during treatment team this morning. Patient does not report any pain or discomfort at this time. Support and encouragement provided to the patient. Scheduled medications administered to patient per physician's orders. Patient is seen in the milieu interacting with some of his peers. He reports that he would like to get his sleep patterns normalized before discharge. Q15 minute checks are maintained for safety.

## 2017-04-11 NOTE — Progress Notes (Signed)
Patient ID: [name], male   DOB: [birth year], 61 y.o.   MRN: [id number]   Upon assessment of as needed sleep and anxiety medication, pt reported no change. Provider notified, see MAR. Tolerated well, pt currently in bed with eyes closed. Audible snoring heard, will continue to monitor.

## 2017-04-11 NOTE — Progress Notes (Signed)
[facility] MD Progress Note  04/11/2017 3:12 PM BENTLEIGH STANKUS  MRN:  [id number] Subjective: patient reports feeling depressed, anxious, but denies suicidal ideations, and states he realizes committing suicide would be very painful for his family, so that he will no longer consider it as an option . Today is ruminative about his adult daughter whom he states  may have cervical cancer and was scheduled to get result from her MD today. He spoke with daughter via phone  and was told she had not gotten information yet . Objective : I have discussed case with treatment team and have met with patient . Patient presents with some improvement , but still depressed, labile, anxious, and today ruminative about his adult daughter's health, as above.  Affect improves partially during session, and responds partially to support, encouragement. Denies medication side effects, and is tolerating Cymbalta trial well thus far . Visible on unit, no disruptive or agitated behaviors on unit. At this time denies suicidal ideations, and as above, identifies family as a protective factor against hurting self . Labs - TSH 0.712     Principal Problem:  Depression Diagnosis:   Patient Active Problem List   Diagnosis Date Noted  . Major depressive disorder, recurrent ([place]) [F33.9] 04/09/2017  . MDD (major depressive disorder), recurrent severe, without psychosis ([place]) [F33.2] 04/09/2017  . Family history of brain aneurysm [Z82.49]   . Intractable vomiting [R11.10] 10/20/2016  . History of intracranial aneurysm [Z86.79]   . Hypokalemia [E87.6]   . AKI (acute kidney injury) ([place]) [N17.9]   . Prolonged QT interval [R94.31]   . Lactic acidosis [E87.2]   . Dysphagia [R13.10]   . Esophageal ring [K22.2]   . Gastroesophageal reflux disease with esophagitis [K21.0]   . Nausea and vomiting [R11.2] 10/01/2016  . 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 Severe episode of recurrent major depressive disorder, without psychotic  features ([place]) [F33.2] 07/20/2016  . CVA (cerebral vascular accident) ([place]) [I63.9] 06/15/2016  . Abdominal pain [R10.9]   . Numbness [R20.0]   . Carpal tunnel syndrome, bilateral [G56.03] 12/16/2015  . Status post stroke [Z86.73] 11/12/2015  . Transient neurologic deficit [R29.818] 10/15/2015  . Cerebrovascular accident, late effects [I69.90] 10/07/2015  . 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 Anxiety state [F41.1]   . 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 Cerebral infarction due to embolism of right middle cerebral artery ([place]) [I63.411] 09/23/2015  . Cerebral infarction due to embolism of cerebral artery ([place]) [I63.40] 09/23/2015  . Gait disturbance, post-stroke [I69.398, R26.9]   . Drooping of mouth [R29.810]   . Cerebrovascular accident (CVA) due to embolism of right anterior cerebral artery ([place]) [I63.421]   . Benign essential HTN [I10]   . Migraine with aura and without status migrainosus, not intractable [G43.109]   . Tachypnea [R06.82]   . Prediabetes [R73.03]   . Acute blood loss anemia [D62]   . Brain aneurysm [I67.1] 09/20/2015  . Type 2 diabetes mellitus ([place]) [E11.9] 09/15/2015  . Leukocytosis [D72.829] 09/15/2015  . Aneurysm, cerebral, nonruptured [I67.1] 09/15/2015  . TIA (transient ischemic attack) [G45.9] 09/14/2015  . Temporary cerebral vascular dysfunction [G93.9] 09/14/2015  . H/O transient cerebral ischemia [Z86.73] 07/29/2015  . At risk for falling [Z91.81] 07/29/2015  . Bilateral hearing loss [H91.93] 10/25/2014  . Abnormal fear [F40.9] 08/08/2012  . Headache, migraine [G43.909] 08/08/2012  . Arthritis, degenerative [M19.90] 08/08/2012  . History of tear of ACL (anterior cruciate ligament) [Z87.828] 03/08/2011  . History of knee problem [Z87.39] 03/08/2011  . Depression [  F32.9] 09/07/2010  . Depressive disorder, not elsewhere classified [F32.9] 09/07/2010  . URI (upper respiratory infection) [J06.9] 08/24/2010  . Hearing loss [H91.90] 09/06/2006  .  Hyperlipidemia [E78.5] 04/23/2006  . Essential hypertension [I10] 04/23/2006  . Psychophysiological insomnia [F51.04] 04/23/2006   Total Time spent with patient: 20 minutes  Past Medical History:  Past Medical History:  Diagnosis Date  . Allergy   . Anxiety   . Cataract   . Cerebral hemorrhage ([place])   . Chest pain, atypical    12/2003:  negative cardiolyte  . Depression   . Drug abuse ([place])   . Eczema   . Erectile dysfunction   . History of meniscal tear    bilateral  . HLD (hyperlipidemia)   . HTN (hypertension)   . Hx of tear of ACL (anterior cruciate ligament)    right  . Insomnia   . Olecranon bursitis of left elbow 10/2009   s/p I&D by Dr Maxie Better, initially assessed by Dr. Nori Riis   . Prediabetes   . Stroke ([place])   . TIA (transient ischemic attack)     Past Surgical History:  Procedure Laterality Date  . INCISE AND DRAIN ABCESS     L elbow due to cellulitis/bursitis  . INNER EAR SURGERY    . IR GENERIC HISTORICAL  12/28/2015   IR ANGIO VERTEBRAL SEL VERTEBRAL UNI L MOD SED 12/28/2015 Luanne Bras, MD [facility]  . IR GENERIC HISTORICAL  12/28/2015   IR ANGIO VERTEBRAL SEL SUBCLAVIAN INNOMINATE UNI R MOD SED 12/28/2015 Luanne Bras, MD [facility]  . IR GENERIC HISTORICAL  12/28/2015   IR ANGIO INTRA EXTRACRAN SEL INTERNAL CAROTID BILAT MOD SED 12/28/2015 Luanne Bras, MD [facility]  . KNEE ARTHROSCOPY    . SKIN TAG REMOVAL     11 removed   Family History:  Family History  Problem Relation Age of Onset  . Stroke Mother   . Hypertension Mother   . Aneurysm Mother 55       Died of brain aneursym  . Heart failure Father   . Emphysema Father   . Diabetes Mellitus II Sister   . Colon cancer Neg Hx   . Rectal cancer Neg Hx   . Stomach cancer Neg Hx    Social History:  Social History   Substance and Sexual Activity  Alcohol Use No  . Alcohol/week: 0.0 oz     Social History   Substance and Sexual Activity  Drug Use No    Social History    Socioeconomic History  . Marital status: Divorced    Spouse name: None  . Number of children: None  . Years of education: None  . Highest education level: None  Social Needs  . Financial resource strain: None  . Food insecurity - worry: None  . Food insecurity - inability: None  . Transportation needs - medical: None  . Transportation needs - non-medical: None  Occupational History  . None  Tobacco Use  . Smoking status: Never Smoker  . Smokeless tobacco: Never Used  Substance and Sexual Activity  . Alcohol use: No    Alcohol/week: 0.0 oz  . Drug use: No  . Sexual activity: No  Other Topics Concern  . None  Social History Narrative   Separated, has 2 daughters, works as a Forensic psychologist.  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 acetaminophen (TYLENOL) tablet 650 mg  650 mg Oral Q6H PRN Ethelene Hal, NP   650 mg at 04/10/17 1607  . alum & mag hydroxide-simeth (MAALOX/MYLANTA) 200-200-20 MG/5ML suspension 30 mL  30 mL Oral Q4H PRN Ethelene Hal, NP      . amLODipine (NORVASC) tablet 10 mg  10 mg Oral Daily Ethelene Hal, NP   10 mg at 04/11/17 0815  . atorvastatin (LIPITOR) tablet 40 mg  40 mg Oral q1800 Ethelene Hal, NP   40 mg at 04/10/17 1708  . chlorthalidone (HYGROTON) tablet 25 mg  25 mg Oral Daily Ethelene Hal, NP   25 mg at 04/11/17 0816  . DULoxetine (CYMBALTA) DR capsule 30 mg  30 mg Oral Daily Cobos, Myer Peer, MD   30 mg at 04/11/17 0815  . gabapentin (NEURONTIN) capsule 300 mg  300 mg Oral QHS Ethelene Hal, NP   300 mg at 04/10/17 2139  . glipiZIDE (GLUCOTROL XL) 24 hr tablet 10 mg  10 mg Oral Q breakfast Ethelene Hal, NP   10 mg at  04/11/17 0815  . hydrOXYzine (ATARAX/VISTARIL) tablet 25 mg  25 mg Oral TID PRN Ethelene Hal, NP   25 mg at 04/10/17 2138  . lisinopril (PRINIVIL,ZESTRIL) tablet 40 mg  40 mg Oral Daily Ethelene Hal, NP   40 mg at 04/11/17 0815  . magnesium hydroxide (MILK OF MAGNESIA) suspension 30 mL  30 mL Oral Daily PRN Ethelene Hal, NP      . metoprolol tartrate (LOPRESSOR) tablet 50 mg  50 mg Oral BID Ethelene Hal, NP   50 mg at 04/11/17 0815  . traZODone (DESYREL) tablet 50 mg  50 mg Oral QHS PRN Ethelene Hal, NP   50 mg at 04/10/17 2139    Lab Results:  Results for orders placed or performed during the hospital encounter of 04/09/17 (from the past 48 hour(s))  Glucose, capillary     Status: Abnormal   Collection Time: 04/10/17  6:27 AM  Result Value Ref Range   Glucose-Capillary 106 (H) 65 - 99 mg/dL  Glucose, capillary     Status: None   Collection Time: 04/11/17  6:06 AM  Result Value Ref Range   Glucose-Capillary 90 65 - 99 mg/dL  TSH     Status: None   Collection Time: 04/11/17  7:56 AM  Result Value Ref Range   TSH 0.712 0.350 - 4.500 uIU/mL    Comment: Performed by a 3rd Generation assay with a functional sensitivity of <=0.01 uIU/mL. Performed at [facility], [place] [address] [place], [place] [address]     Blood Alcohol level:  Lab Results  Component Value Date   [facility] <10 04/08/2017   ETH <10 26/94/8546    Metabolic Disorder Labs: Lab Results  Component Value Date   HGBA1C 8.0 (H) 06/16/2016   MPG 183 06/16/2016   MPG 131 12/28/2015   No results found for: PROLACTIN Lab Results  Component Value Date   CHOL 173 06/16/2016   TRIG 203 (H) 06/16/2016   HDL 38 (L) 06/16/2016   CHOLHDL 4.6 06/16/2016   VLDL 41 (H) 06/16/2016   LDLCALC 94 06/16/2016   LDLCALC 140 (H) 12/28/2015    Physical Findings: AIMS: Facial and Oral Movements Muscles of Facial Expression: None, normal Lips and Perioral Area: None,  normal Jaw: None, normal Tongue: None, normal,Extremity Movements Upper (arms, wrists, hands, fingers): None, normal Lower (legs, knees, ankles, toes): None, normal, Trunk Movements Neck, shoulders, hips: None, normal, Overall Severity Severity of abnormal movements (highest score from questions above): None, normal Incapacitation due to abnormal movements: None, normal Patient's awareness of abnormal movements (rate only patient's report): No Awareness, Dental Status Current problems with teeth and/or dentures?: No Does patient usually wear dentures?: No  CIWA:    COWS:     Musculoskeletal: Strength & Muscle Tone: within normal limits Gait & Station: normal Patient leans: N/A  Psychiatric Specialty Exam: Physical Exam  ROS denies headache today, no chest pain, no shortness of breath, denies bleeding   Blood pressure 113/65, pulse 67, temperature 97.8 F (36.6 C), temperature source Oral, resp. rate 18, height '5\' 8"'$  (1.727 m), weight 116.1 kg (256 lb).Body mass index is 38.92 kg/m.  General Appearance: Fairly Groomed  Eye Contact:  Fair  Speech:  Normal Rate  Volume:  Normal  Mood:  remains depressed, anxious, partially improved  Affect:  labile but reactive, and improves during session  Thought Process:  Linear and Descriptions of Associations: Intact  Orientation:  Other:  fully alert and attentive   Thought Content:  denies hallucinations, no delusions expressed, ruminative about duaghter's health  Suicidal Thoughts:  No- at this time denies suicidal or self injurious ideations, denies homicidal or violent ideations, contracts for safety on unit   Homicidal Thoughts:  No  Memory:  recent and remote grossly intact   Judgement:  Fair- improving   Insight:  Fair- improving   Psychomotor Activity:  Normal  Concentration:  Concentration: Good and Attention Span: Good  Recall:  Good  Fund of Knowledge:  Good  Language:  Good  Akathisia:  Negative  Handed:  Right  AIMS (if  indicated):     Assets:  Desire for Improvement Resilience  ADL's:  Intact  Cognition:  WNL  Sleep:  Number of Hours: 6.75   Assessment - patient remains depressed, anxious, and intermittently tearful. Today , however, reports he is feeling better than prior to admission, and today denies any suicidal ideations and identifies love for family members as protective factor from considering suicide . Tends to ruminate about adult daughter's health, whom he states may have cancer . Denies medication side effects and is tolerating Cymbalta trial well thus far   Treatment Plan Summary: Daily contact with patient to assess and evaluate symptoms and progress in treatment, Medication management, Plan inpatient treatment  and medications as below Treatment team working on disposition planning options  Encourage group and milieu participation to work on coping skills and symptom reduction  Increase Cymbalta to 40 mgrs QDAY for depression and anxiety  Continue Neurontin 300 mgrs QHS for pain and anxiety  Continue Trazodone 50 mgrs QHS PRN for insomnia as needed  Continue VIstaril 25 mgrs Q8 hours PRN for anxiety as needed  Jenne Campus, MD 04/11/2017, 3:12 PM

## 2017-04-11 NOTE — Tx Team (Signed)
Interdisciplinary Treatment and Diagnostic Plan Update 04/11/2017 Time of Session: 9:30am  [name]  MRN: [id number]  Principal Diagnosis: MDD, Severe, No Psychotic     Secondary Diagnoses: Active Problems:   MDD (major depressive disorder), recurrent severe, without psychosis ([place])   Current Medications:  Current Facility-Administered Medications  Medication Dose Route Frequency Provider Last Rate Last Dose  . acetaminophen (TYLENOL) tablet 650 mg  650 mg Oral Q6H PRN Ethelene Hal, NP   650 mg at 04/10/17 2536  . alum & mag hydroxide-simeth (MAALOX/MYLANTA) 200-200-20 MG/5ML suspension 30 mL  30 mL Oral Q4H PRN Ethelene Hal, NP      . amLODipine (NORVASC) tablet 10 mg  10 mg Oral Daily Ethelene Hal, NP   10 mg at 04/11/17 0815  . atorvastatin (LIPITOR) tablet 40 mg  40 mg Oral q1800 Ethelene Hal, NP   40 mg at 04/10/17 1708  . chlorthalidone (HYGROTON) tablet 25 mg  25 mg Oral Daily Ethelene Hal, NP   25 mg at 04/11/17 0816  . DULoxetine (CYMBALTA) DR capsule 30 mg  30 mg Oral Daily Cobos, Myer Peer, MD   30 mg at 04/11/17 0815  . gabapentin (NEURONTIN) capsule 300 mg  300 mg Oral QHS Ethelene Hal, NP   300 mg at 04/10/17 2139  . glipiZIDE (GLUCOTROL XL) 24 hr tablet 10 mg  10 mg Oral Q breakfast Ethelene Hal, NP   10 mg at 04/11/17 0815  . hydrOXYzine (ATARAX/VISTARIL) tablet 25 mg  25 mg Oral TID PRN Ethelene Hal, NP   25 mg at 04/10/17 2138  . lisinopril (PRINIVIL,ZESTRIL) tablet 40 mg  40 mg Oral Daily Ethelene Hal, NP   40 mg at 04/11/17 0815  . magnesium hydroxide (MILK OF MAGNESIA) suspension 30 mL  30 mL Oral Daily PRN Ethelene Hal, NP      . metoprolol tartrate (LOPRESSOR) tablet 50 mg  50 mg Oral BID Ethelene Hal, NP   50 mg at 04/11/17 0815  . traZODone (DESYREL) tablet 50 mg  50 mg Oral QHS PRN Ethelene Hal, NP   50 mg at 04/10/17 2139    PTA  Medications: Medications Prior to Admission  Medication Sig Dispense Refill Last Dose  . amLODipine (NORVASC) 10 MG tablet Take 1 tablet (10 mg total) by mouth daily. 30 tablet 0 04/08/2017 at Unknown time  . atorvastatin (LIPITOR) 20 MG tablet Take 20 mg daily by mouth.   04/08/2017 at Unknown time  . atorvastatin (LIPITOR) 40 MG tablet Take 1 tablet (40 mg total) by mouth daily at 6 PM. 30 tablet 0 04/08/2017 at Unknown time  . chlorthalidone (HYGROTON) 25 MG tablet Take 25 mg daily by mouth.    04/08/2017 at Unknown time  . clopidogrel (PLAVIX) 75 MG tablet Take 1 tablet (75 mg total) by mouth daily. (Patient not taking: Reported on 03/12/2017) 30 tablet 0 Not Taking at Unknown time  . FLUoxetine (PROZAC) 20 MG capsule Take 1 capsule (20 mg total) by mouth daily. Take with 40 mg for a total of 60 mg 90 capsule 1 04/08/2017 at Unknown time  . gabapentin (NEURONTIN) 300 MG capsule Take 300 mg by mouth at bedtime.   04/07/2017 at Unknown time  . glipiZIDE (GLUCOTROL XL) 10 MG 24 hr tablet Take 10 mg daily with breakfast by mouth.   04/08/2017 at Unknown time  . indapamide (LOZOL) 1.25 MG tablet Take 1.25 mg daily by mouth.  04/08/2017 at Unknown time  . lisinopril (PRINIVIL,ZESTRIL) 40 MG tablet Take 40 mg by mouth daily.   04/08/2017 at Unknown time  . meclizine (ANTIVERT) 32 MG tablet Take 1 tablet (32 mg total) by mouth 3 (three) times daily as needed. (Patient not taking: Reported on 04/02/2017) 30 tablet 0 Not Taking at Unknown time  . metoprolol (LOPRESSOR) 50 MG tablet Take 1 tablet (50 mg total) by mouth 2 (two) times daily. 60 tablet 0 04/08/2017 at 1000  . pantoprazole (PROTONIX) 40 MG tablet Take 1 tablet (40 mg total) by mouth daily. (Patient not taking: Reported on 04/02/2017) 30 tablet 0 Not Taking at Unknown time    Treatment Modalities: Medication Management, Group therapy, Case management,  1 to 1 session with clinician, Psychoeducation, Recreational therapy.  Patient Stressors:  Health problems Medication change or noncompliance Patient Strengths: Ability for insight Average or above average intelligence Capable of independent living General fund of knowledge Motivation for treatment/growth  Physician Treatment Plan for Primary Diagnosis: MDD, Severe, No Psychotic    Long Term Goal(s): Improvement in symptoms so as ready for discharge Short Term Goals: Ability to identify changes in lifestyle to reduce recurrence of condition will improve Ability to identify triggers associated with substance abuse/mental health issues will improve Ability to verbalize feelings will improve Ability to disclose and discuss suicidal ideas Ability to demonstrate self-control will improve Ability to identify and develop effective coping behaviors will improve  Medication Management: Evaluate patient's response, side effects, and tolerance of medication regimen.  Therapeutic Interventions: 1 to 1 sessions, Unit Group sessions and Medication administration.  Evaluation of Outcomes: Progressing  Physician Treatment Plan for Secondary Diagnosis: Active Problems:   MDD (major depressive disorder), recurrent severe, without psychosis ([place])  Long Term Goal(s): Improvement in symptoms so as ready for discharge  Short Term Goals: Ability to identify changes in lifestyle to reduce recurrence of condition will improve Ability to identify triggers associated with substance abuse/mental health issues will improve Ability to verbalize feelings will improve Ability to disclose and discuss suicidal ideas Ability to demonstrate self-control will improve Ability to identify and develop effective coping behaviors will improve  Medication Management: Evaluate patient's response, side effects, and tolerance of medication regimen.  Therapeutic Interventions: 1 to 1 sessions, Unit Group sessions and Medication administration.  Evaluation of Outcomes: Progressing  RN Treatment Plan for  Primary Diagnosis: MDD, Severe, No Psychotic    Long Term Goal(s): Knowledge of disease and therapeutic regimen to maintain health will improve  Short Term Goals: Compliance with prescribed medications will improve  Medication Management: RN will administer medications as ordered by provider, will assess and evaluate patient's response and provide education to patient for prescribed medication. RN will report any adverse and/or side effects to prescribing provider.  Therapeutic Interventions: 1 on 1 counseling sessions, Psychoeducation, Medication administration, Evaluate responses to treatment, Monitor vital signs and CBGs as ordered, Perform/monitor CIWA, COWS, AIMS and Fall Risk screenings as ordered, Perform wound care treatments as ordered.  Evaluation of Outcomes: Progressing  LCSW Treatment Plan for Primary Diagnosis: MDD, Severe, No Psychotic    Long Term Goal(s): Safe transition to appropriate next level of care at discharge, Engage patient in therapeutic group addressing interpersonal concerns. Short Term Goals: Engage patient in aftercare planning with referrals and resources, Increase emotional regulation, Identify triggers associated with mental health/substance abuse issues and Increase skills for wellness and recovery  Therapeutic Interventions: Assess for all discharge needs, 1 to 1 time with Social worker, Explore available resources  and support systems, Assess for adequacy in community support network, Educate family and significant other(s) on suicide prevention, Complete Psychosocial Assessment, Interpersonal group therapy.  Evaluation of Outcomes: Progressing  Progress in Treatment: Attending groups: Yes  Participating in groups: Yes Taking medication as prescribed: Yes, MD continues to assess for medication changes as needed Toleration medication: Yes, no side effects reported at this time Family/Significant other contact made: No, CSW attempting contact with pt's  daughter. Patient understands diagnosis: Developing insight Discussing patient identified problems/goals with staff: Yes Medical problems stabilized or resolved: Yes Denies suicidal/homicidal ideation: Yes Issues/concerns per patient self-inventory: None Other: N/A  New problem(s) identified: None identified at this time.   New Short Term/Long Term Goal(s): None identified at this time.   Discharge Plan or Barriers: Pt will return home and follow up outpatient with [facility].  Reason for Continuation of Hospitalization:  Anxiety  Depression Medication stabilization Suicidal ideation  Estimated Length of Stay: 1-3 days; Estimated discharge date 04/14/17  Attendees: Patient: 04/11/2017 11:53 AM  Physician: Dr. Parke Poisson 04/11/2017 11:53 AM  Nursing: Trinna Post RN; Chong Sicilian, RN 04/11/2017 11:53 AM  RN Care Manager: Lars Pinks, RN 04/11/2017 11:53 AM  Social Worker: Matthew Saras, [place] 04/11/2017 11:53 AM  Recreational Therapist:  04/11/2017 11:53 AM  Other: Lindell Spar, NP 04/11/2017 11:53 AM  Other:  04/11/2017 11:53 AM  Other: 04/11/2017 11:53 AM  Scribe for Treatment Team: Georga Kaufmann, MSW,LCSWA 04/11/2017 11:53 AM

## 2017-04-11 NOTE — Progress Notes (Signed)
Recreation Therapy Notes  Date:  04/11/17 Time: 0930 Location: [address] Dayroom  Group Topic: Stress Management  Goal Area(s) Addresses:  Patient will verbalize importance of using healthy stress management.  Patient will identify positive emotions associated with healthy stress management.   Behavioral Response: Engaged  Intervention: Stress Management  Activity :  Guided Imagery.  LRT introduced the stress management technique of guided imagery.  LRT read a script to help patients envision their safe and peaceful place.  Patients were to follow along as LRT read script.  Education:  Stress Management, Discharge Planning.   Education Outcome: Acknowledges edcuation/In group clarification offered/Needs additional education  Clinical Observations/Feedback: Pt attended group.    Victorino Sparrow, LRT/CTRS         Victorino Sparrow A 04/11/2017 12:38 PM

## 2017-04-11 NOTE — Progress Notes (Addendum)
Patient ID: [name], male   DOB: 02/01/1956, 61 y.o.   MRN: [id number]  Pt currently presents with an anxious affect and guarded behavior. Interaction with patient is superficial and occasionally sarcastic. When told patient would have to wait for his medication based on order, he states "doesn't it make you feel bad (to do that to me.)" Reports his day was a good one because he was able to speak to his 57 year old grandson.  Pt reports poor sleep with current medication regimen.   Pt provided with medications per providers orders. Pt's labs and vitals were monitored throughout the night. Pt given a 1:1 about emotional and mental status. Pt supported and encouraged to express concerns and questions. Pt educated on medications. Provider notified of patients insomnia concerns, see MAR.   Pt's safety ensured with 15 minute and environmental checks. Pt currently denies SI/HI and A/V hallucinations. Pt verbally agrees to seek staff if SI/HI or A/VH occurs and to consult with staff before acting on any harmful thoughts. Reports that he has been unable to eat or drink "much over the past couple of months. I lost like 30 lbs so far." MHT notes patient has not been drinking or eating much today. Reports decreased appetite. Will continue POC.

## 2017-04-11 NOTE — [facility] Group Notes (Signed)
[facility] Group Therapy 04/11/2017 1:15pm  Type of Therapy: [facility] [employer] Presentation  Participation Level: Active  Participation Quality: Attentive  Affect: Appropriate  Cognitive: Oriented  Insight: Developing/Improving  Engagement in Therapy: Engaged  Modes of Intervention: Discussion, Education and Socialization  Summary of Progress/Problems: [employer] ([facility]) Speaker came to talk about his personal journey with living with a mental health diagnosis. The pt processed ways by which to relate to the speaker. MHA speaker provided handouts and educational information pertaining to groups and services offered by the MHA. Pt was engaged in speaker's presentation and was receptive to resources provided.    [name] Cyris Maalouf, MSW, LCSWA 04/11/2017 3:34 PM   

## 2017-04-12 LAB — GLUCOSE, CAPILLARY: Glucose-Capillary: 73 mg/dL (ref 65–99)

## 2017-04-12 MED ORDER — METOPROLOL TARTRATE 25 MG PO TABS
25.0000 mg | ORAL_TABLET | Freq: Two times a day (BID) | ORAL | Status: DC
  Administered 2017-04-12: 25 mg via ORAL
  Filled 2017-04-12 (×4): qty 1

## 2017-04-12 MED ORDER — HYDROXYZINE HCL 25 MG PO TABS
25.0000 mg | ORAL_TABLET | Freq: Once | ORAL | Status: AC
  Administered 2017-04-12: 25 mg via ORAL
  Filled 2017-04-12: qty 1

## 2017-04-12 NOTE — Progress Notes (Signed)
Patient ID: [name], male   DOB: [birth year], 61 y.o.   MRN: [id number]  Pt currently presents with a sad affect and depressed behavior. Pt reports that he is sad because he feels he cannot hear well during groups and feels "ashamed to ask people can you repeat that, can you repeat that." Pt reports multiple negative statements to writer about self concept and helplessness. Pt states "I grew up poor, made money and now it has all been taken away from me." Pt reports good sleep withmedication regimen last night, asks for increase in sleep medication.   Pt provided with medications per providers orders. Pt's labs and vitals were monitored throughout the night. Pt given a 1:1 about emotional and mental status. Pt supported and encouraged to express concerns and questions. Pt educated on medications. Provider consulted about patients sleep concerns, see MAR.   Pt's safety ensured with 15 minute and environmental checks. Pt currently denies SI/HI and A/V hallucinations. Pt verbally agrees to seek staff if SI/HI or A/VH occurs and to consult with staff before acting on any harmful thoughts. Pt affect is more depressed and sad tonight. Interacts minimally with peers, chooses not to attend groups. Reports anxiety about getting financial support post discharge. Will continue POC.

## 2017-04-12 NOTE — Progress Notes (Addendum)
[facility] MD Progress Note  04/12/2017 8:38 AM [name]  MRN:  [id number] Subjective: patient reports he is feeling better than on admission. Remains somewhat depressed, anxious, but reports " I feel a lot better, I find myself to be enjoying things again", and states he has been enjoying groups and interactions with peers . He denies medication side effects. Denies suicidal ideations at this time.  Objective : I have discussed case with treatment team and have met with patient . Patient reports feeling better, and does present with an overall improved mood and more reactive affect. Today not labile or tearful, and although reports he still feels anxious about his daughter ( who may have cancer and is awaiting results ), he presents calmer, less ruminative, with a more relaxed demeanor . Visible on unit, interactive. No disruptive or agitated behaviors on unit.  Denies medication side effects at this time. Reports sleep and appetite have been fair but are improving . As he improves he is becoming more future oriented and more focused on disposition planning     Principal Problem:  Depression Diagnosis:   Patient Active Problem List   Diagnosis Date Noted  . Major depressive disorder, recurrent ([place]) [F33.9] 04/09/2017  . MDD (major depressive disorder), recurrent severe, without psychosis ([place]) [F33.2] 04/09/2017  . Family history of brain aneurysm [Z82.49]   . Intractable vomiting [R11.10] 10/20/2016  . History of intracranial aneurysm [Z86.79]   . Hypokalemia [E87.6]   . AKI (acute kidney injury) ([place]) [N17.9]   . Prolonged QT interval [R94.31]   . Lactic acidosis [E87.2]   . Dysphagia [R13.10]   . Esophageal ring [K22.2]   . Gastroesophageal reflux disease with esophagitis [K21.0]   . Nausea and vomiting [R11.2] 10/01/2016  . 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 Severe episode of recurrent major depressive disorder, without psychotic features ([place]) [F33.2] 07/20/2016  . CVA  (cerebral vascular accident) ([place]) [I63.9] 06/15/2016  . Abdominal pain [R10.9]   . Numbness [R20.0]   . Carpal tunnel syndrome, bilateral [G56.03] 12/16/2015  . Status post stroke [Z86.73] 11/12/2015  . Transient neurologic deficit [R29.818] 10/15/2015  . Cerebrovascular accident, late effects [I69.90] 10/07/2015  . Complicated migraine [P82.423]   . Anxiety state [F41.1]   . 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 Cerebral infarction due to embolism of right middle cerebral artery ([place]) [I63.411] 09/23/2015  . Cerebral infarction due to embolism of cerebral artery ([place]) [I63.40] 09/23/2015  . Gait disturbance, post-stroke [I69.398, R26.9]   . Drooping of mouth [R29.810]   . Cerebrovascular accident (CVA) due to embolism of right anterior cerebral artery ([place]) [I63.421]   . Benign essential HTN [I10]   . Migraine with aura and without status migrainosus, not intractable [G43.109]   . Tachypnea [R06.82]   . Prediabetes [R73.03]   . Acute blood loss anemia [D62]   . Brain aneurysm [I67.1] 09/20/2015  . Type 2 diabetes mellitus ([place]) [E11.9] 09/15/2015  . Leukocytosis [D72.829] 09/15/2015  . Aneurysm, cerebral, nonruptured [I67.1] 09/15/2015  . TIA (transient ischemic attack) [G45.9] 09/14/2015  . Temporary cerebral vascular dysfunction [G93.9] 09/14/2015  . H/O transient cerebral ischemia [Z86.73] 07/29/2015  . At risk for falling [Z91.81] 07/29/2015  . Bilateral hearing loss [H91.93] 10/25/2014  . Abnormal fear [F40.9] 08/08/2012  . Headache, migraine [G43.909] 08/08/2012  . Arthritis, degenerative [M19.90] 08/08/2012  . History of tear of ACL (anterior cruciate ligament) [Z87.828] 03/08/2011  . History of knee problem [Z87.39] 03/08/2011  . Depression [F32.9] 09/07/2010  . Depressive  disorder, not elsewhere classified [F32.9] 09/07/2010  . URI (upper respiratory infection) [J06.9] 08/24/2010  . Hearing loss [H91.90] 09/06/2006  . Hyperlipidemia [E78.5] 04/23/2006  .  Essential hypertension [I10] 04/23/2006  . Psychophysiological insomnia [F51.04] 04/23/2006   Total Time spent with patient: 20 minutes  Past Medical History:  Past Medical History:  Diagnosis Date  . Allergy   . Anxiety   . Cataract   . Cerebral hemorrhage ([place])   . Chest pain, atypical    12/2003:  negative cardiolyte  . Depression   . Drug abuse ([place])   . Eczema   . Erectile dysfunction   . History of meniscal tear    bilateral  . HLD (hyperlipidemia)   . HTN (hypertension)   . Hx of tear of ACL (anterior cruciate ligament)    right  . Insomnia   . Olecranon bursitis of left elbow 10/2009   s/p I&D by Dr Maxie Better, initially assessed by Dr. Nori Riis   . Prediabetes   . Stroke ([place])   . TIA (transient ischemic attack)     Past Surgical History:  Procedure Laterality Date  . EP IMPLANTABLE DEVICE N/A 12/30/2015   Procedure: Loop Recorder Insertion;  Surgeon: Thompson Grayer, MD;  Location: [facility];  Service: Cardiovascular;  Laterality: N/A;  . ESOPHAGOGASTRODUODENOSCOPY N/A 10/03/2016   Procedure: ESOPHAGOGASTRODUODENOSCOPY (EGD);  Surgeon: Irene Shipper, MD;  Location[facility] ENDOSCOPY;  Service: Endoscopy;  Laterality: N/A;  . INCISE AND DRAIN ABCESS     L elbow due to cellulitis/bursitis  . INNER EAR SURGERY    . IR GENERIC HISTORICAL  12/28/2015   IR ANGIO VERTEBRAL SEL VERTEBRAL UNI L MOD SED 12/28/2015 Luanne Bras, MD [facility]  . IR GENERIC HISTORICAL  12/28/2015   IR ANGIO VERTEBRAL SEL SUBCLAVIAN INNOMINATE UNI R MOD SED 12/28/2015 Luanne Bras, MD [facility]  . IR GENERIC HISTORICAL  12/28/2015   IR ANGIO INTRA EXTRACRAN SEL INTERNAL CAROTID BILAT MOD SED 12/28/2015 Luanne Bras, MD [facility]  . KNEE ARTHROSCOPY    . RADIOLOGY WITH ANESTHESIA N/A 09/20/2015   Procedure: EMBOLIZATION         (RADIOLOGY WITH ANESTHESIA);  Surgeon: Luanne Bras, MD;  Location: [facility];  Service: Radiology;  Laterality: N/A;  . SKIN TAG REMOVAL     11 removed  . TEE  WITHOUT CARDIOVERSION N/A 12/30/2015   Procedure: TRANSESOPHAGEAL ECHOCARDIOGRAM (TEE);  Surgeon: Larey Dresser, MD;  Location[facility]COPY;  Service: Cardiovascular;  Laterality: N/A;   Family History:  Family History  Problem Relation Age of Onset  . Stroke Mother   . Hypertension Mother   . Aneurysm Mother 68       Died of brain aneursym  . Heart failure Father   . Emphysema Father   . Diabetes Mellitus II Sister   . Colon cancer Neg Hx   . Rectal cancer Neg Hx   . Stomach cancer Neg Hx    Social History:  Social History   Substance and Sexual Activity  Alcohol Use No  . Alcohol/week: 0.0 oz     Social History   Substance and Sexual Activity  Drug Use No    Social History   Socioeconomic History  . Marital status: Divorced    Spouse name: None  . Number of children: None  . Years of education: None  . Highest education level: None  Social Needs  . Financial resource strain: None  . Food insecurity - worry: None  . Food  insecurity - inability: None  . Transportation needs - medical: None  . Transportation needs - non-medical: None  Occupational History  . None  Tobacco Use  . Smoking status: Never Smoker  . Smokeless tobacco: Never Used  Substance and Sexual Activity  . Alcohol use: No    Alcohol/week: 0.0 oz  . Drug use: No  . Sexual activity: No  Other Topics Concern  . None  Social History Narrative   Separated, has 2 daughters, works as a Forensic psychologist.  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 acetaminophen (TYLENOL) tablet 650 mg  650 mg Oral Q6H PRN Ethelene Hal, NP   650 mg at 04/10/17 9758  . alum & mag hydroxide-simeth (MAALOX/MYLANTA) 200-200-20 MG/5ML suspension  30 mL  30 mL Oral Q4H PRN Ethelene Hal, NP      . amLODipine (NORVASC) tablet 10 mg  10 mg Oral Daily Ethelene Hal, NP   10 mg at 04/12/17 0809  . atorvastatin (LIPITOR) tablet 40 mg  40 mg Oral q1800 Ethelene Hal, NP   40 mg at 04/11/17 1731  . chlorthalidone (HYGROTON) tablet 25 mg  25 mg Oral Daily Ethelene Hal, NP   25 mg at 04/12/17 8325  . DULoxetine (CYMBALTA) DR capsule 40 mg  40 mg Oral Daily Levii Hairfield, Myer Peer, MD   40 mg at 04/12/17 0809  . gabapentin (NEURONTIN) capsule 300 mg  300 mg Oral QHS Ethelene Hal, NP   300 mg at 04/11/17 2108  . glipiZIDE (GLUCOTROL XL) 24 hr tablet 10 mg  10 mg Oral Q breakfast Ethelene Hal, NP   10 mg at 04/12/17 0809  . hydrOXYzine (ATARAX/VISTARIL) tablet 25 mg  25 mg Oral TID PRN Ethelene Hal, NP   25 mg at 04/11/17 2108  . lisinopril (PRINIVIL,ZESTRIL) tablet 40 mg  40 mg Oral Daily Ethelene Hal, NP   40 mg at 04/12/17 0810  . magnesium hydroxide (MILK OF MAGNESIA) suspension 30 mL  30 mL Oral Daily PRN Ethelene Hal, NP      . metoprolol tartrate (LOPRESSOR) tablet 50 mg  50 mg Oral BID Ethelene Hal, NP   50 mg at 04/12/17 0809  . traZODone (DESYREL) tablet 50 mg  50 mg Oral QHS PRN Ethelene Hal, NP   50 mg at 04/11/17 2108    Lab Results:  Results for orders placed or performed during the hospital encounter of 04/09/17 (from the past 48 hour(s))  Glucose, capillary     Status: None   Collection Time: 04/11/17  6:06 AM  Result Value Ref Range   Glucose-Capillary 90 65 - 99 mg/dL  TSH     Status: None   Collection Time: 04/11/17  7:56 AM  Result Value Ref Range   TSH 0.712 0.350 - 4.500 uIU/mL    Comment: Performed by a 3rd Generation assay with a functional sensitivity of <=0.01 uIU/mL. Performed at [facility], [place] [address], [place], [place] [address]   Glucose, capillary     Status: None   Collection Time: 04/12/17  6:18 AM   Result Value Ref Range   Glucose-Capillary 73 65 - 99 mg/dL    Blood Alcohol level:  Lab Results  Component Value Date   ETH <10 04/08/2017   ETH <10 65/46/5035    Metabolic Disorder Labs: Lab Results  Component Value Date   HGBA1C 8.0 (H) 06/16/2016   MPG 183 06/16/2016   MPG 131 12/28/2015   No results found for: PROLACTIN Lab Results  Component Value Date   CHOL 173 06/16/2016   TRIG 203 (H) 06/16/2016   HDL 38 (L) 06/16/2016   CHOLHDL 4.6 06/16/2016   VLDL 41 (H) 06/16/2016   LDLCALC 94 06/16/2016   LDLCALC 140 (H) 12/28/2015    Physical Findings: AIMS: Facial and Oral Movements Muscles of Facial Expression: None, normal Lips and Perioral Area: None, normal Jaw: None, normal Tongue: None, normal,Extremity Movements Upper (arms, wrists, hands, fingers): None, normal Lower (legs, knees, ankles, toes): None, normal, Trunk Movements Neck, shoulders, hips: None, normal, Overall Severity Severity of abnormal movements (highest score from questions above): None, normal Incapacitation due to abnormal movements: None, normal Patient's awareness of abnormal movements (rate only patient's report): No Awareness, Dental Status Current problems with teeth and/or dentures?: No Does patient usually wear dentures?: No  CIWA:    COWS:     Musculoskeletal: Strength & Muscle Tone: within normal limits Gait & Station: normal Patient leans: N/A  Psychiatric Specialty Exam: Physical Exam  ROS denies headache today, no chest pain, no shortness of breath, denies bleeding   Blood pressure 114/71, pulse 70, temperature 97.7 F (36.5 C), temperature source Oral, resp. rate 16, height '5\' 8"'$  (1.727 m), weight 116.1 kg (256 lb).Body mass index is 38.92 kg/m.  General Appearance: improving grooming   Eye Contact:  Good  Speech:  Normal Rate  Volume:  Normal  Mood:  improving , less depressed   Affect:  more reactive, less labile , not tearful today  Thought Process:  Linear and  Descriptions of Associations: Intact  Orientation:  Other:  fully alert and attentive   Thought Content:  no hallucinations, no delusions, less ruminative   Suicidal Thoughts:  No- at this time denies suicidal or self injurious ideations, denies homicidal or violent ideations, contracts for safety on unit   Homicidal Thoughts:  No  Memory:  recent and remote grossly intact   Judgement:  improving   Insight:   improving   Psychomotor Activity:  Normal  Concentration:  Concentration: Good and Attention Span: Good  Recall:  Good  Fund of Knowledge:  Good  Language:  Good  Akathisia:  Negative  Handed:  Right  AIMS (if indicated):     Assets:  Desire for Improvement Resilience  ADL's:  Intact  Cognition:  WNL  Sleep:  Number of Hours: 6.5   Assessment - patient is presenting with improving mood and range of affect . Denies suicidal ideations today and is more future oriented, discussing disposition options. Thus far tolerating medications well  ( on Cymbalta) .    Treatment Plan Summary: Treatment Plan reviewed as below today 11/15  Daily contact with patient to assess and evaluate symptoms and progress in treatment, Medication management, Plan inpatient treatment  and medications as below Treatment team working on disposition planning options  Encourage group and milieu participation to work on coping skills and symptom reduction  Continue Cymbalta 40 mgrs QDAY for depression and anxiety  Continue Neurontin 300 mgrs QHS for pain and anxiety  Continue Trazodone 50 mgrs QHS PRN for insomnia as needed  Continue VIstaril 25 mgrs Q8 hours PRN for anxiety as needed  Have reviewed case with Hospitalist  regarding management of HTN, as patient on several antihypertensive medications and BP tends to be in low normal range . Recommendation is to taper down metoprolol dose .  Jenne Campus, MD 04/12/2017, 8:38 AM   Patient ID: [name], male   DOB: 1955-12-12, 62 y.o.   MRN:  [id number]

## 2017-04-12 NOTE — Progress Notes (Signed)
Called by Dr. Parke Poisson for recommendations for blood pressure medication management.  Patient currently on lisinopril 40mg , Metoprolol 50mg  BID and amlodipine 10mg .  Will start to taper metoprolol to 25mg  BID as patient voicing some dizziness and lightheadedness.  Can consider tapering amlodipine after tapering and cessation of metoprolol if blood pressures continue to remain controlled.

## 2017-04-12 NOTE — Progress Notes (Signed)
D: Pt presents with a flat affect and depressed mood. Pt reported depression, hopelessness and crying spells. Pt reported to writer that he can't control his mood due to an aneurysm. Pt verbalized to writer that he's had three strokes in the past and is lucky to be alive. Pt stated that several people in his family have passed away from having a stroke. Per pt, "I guess I'm the lucky one. I'm not sure if this is luck having to live like his. If only there was a pill to fix it". Pt reported sleeping well last night with the combination of meds he took last night. Tx team made aware to adjust pt sleep medications per pt request. A: Medications administered as ordered per MD. Verbal support provided. Pt encouraged to attend groups. 15 minute checks performed for safety. R: Pt compliant with tx.

## 2017-04-13 ENCOUNTER — Other Ambulatory Visit (HOSPITAL_COMMUNITY): Payer: Self-pay

## 2017-04-13 ENCOUNTER — Emergency Department (HOSPITAL_COMMUNITY)
Admission: EM | Admit: 2017-04-13 | Discharge: 2017-04-13 | Disposition: A | Discharge status: Still In Hospital | Payer: Self-pay

## 2017-04-13 ENCOUNTER — Ambulatory Visit (HOSPITAL_COMMUNITY): Payer: Self-pay | Admitting: Psychiatry

## 2017-04-13 ENCOUNTER — Observation Stay (HOSPITAL_COMMUNITY)
Admission: AD | Admit: 2017-04-13 | Discharge: 2017-04-14 | Disposition: A | Discharge status: Home / Self Care | Payer: Medicaid Other | Source: Ambulatory Visit | Attending: Student in an Organized Health Care Education/Training Program | Admitting: Student in an Organized Health Care Education/Training Program

## 2017-04-13 ENCOUNTER — Emergency Department (HOSPITAL_COMMUNITY): Payer: Medicaid Other

## 2017-04-13 ENCOUNTER — Other Ambulatory Visit: Payer: Self-pay

## 2017-04-13 ENCOUNTER — Encounter (HOSPITAL_COMMUNITY): Payer: Self-pay | Admitting: Emergency Medicine

## 2017-04-13 DIAGNOSIS — Z79899 Other long term (current) drug therapy: Secondary | ICD-10-CM

## 2017-04-13 DIAGNOSIS — E876 Hypokalemia: Secondary | ICD-10-CM

## 2017-04-13 DIAGNOSIS — I10 Essential (primary) hypertension: Secondary | ICD-10-CM | POA: Diagnosis not present

## 2017-04-13 DIAGNOSIS — F329 Major depressive disorder, single episode, unspecified: Secondary | ICD-10-CM

## 2017-04-13 DIAGNOSIS — Z915 Personal history of self-harm: Secondary | ICD-10-CM

## 2017-04-13 DIAGNOSIS — F418 Other specified anxiety disorders: Secondary | ICD-10-CM | POA: Insufficient documentation

## 2017-04-13 DIAGNOSIS — F332 Major depressive disorder, recurrent severe without psychotic features: Secondary | ICD-10-CM | POA: Insufficient documentation

## 2017-04-13 DIAGNOSIS — Z7901 Long term (current) use of anticoagulants: Secondary | ICD-10-CM | POA: Insufficient documentation

## 2017-04-13 DIAGNOSIS — G459 Transient cerebral ischemic attack, unspecified: Secondary | ICD-10-CM | POA: Diagnosis not present

## 2017-04-13 DIAGNOSIS — R4589 Other symptoms and signs involving emotional state: Secondary | ICD-10-CM

## 2017-04-13 DIAGNOSIS — Z7982 Long term (current) use of aspirin: Secondary | ICD-10-CM | POA: Insufficient documentation

## 2017-04-13 DIAGNOSIS — C785 Secondary malignant neoplasm of large intestine and rectum: Secondary | ICD-10-CM | POA: Insufficient documentation

## 2017-04-13 HISTORY — DX: Other symptoms and signs involving emotional state: R45.89

## 2017-04-13 LAB — I-STAT CHEM 8, ED
BUN: 19 mg/dL (ref 6–20)
CALCIUM ION: 0.94 mmol/L — AB (ref 1.15–1.40)
Chloride: 104 mmol/L (ref 101–111)
Creatinine, Ser: 1.1 mg/dL (ref 0.61–1.24)
Glucose, Bld: 170 mg/dL — ABNORMAL HIGH (ref 65–99)
HCT: 38 % — ABNORMAL LOW (ref 39.0–52.0)
Hemoglobin: 12.9 g/dL — ABNORMAL LOW (ref 13.0–17.0)
Potassium: 3.5 mmol/L (ref 3.5–5.1)
Sodium: 138 mmol/L (ref 135–145)
TCO2: 26 mmol/L (ref 22–32)

## 2017-04-13 LAB — CBC WITH DIFFERENTIAL/PLATELET
BASOS PCT: 1 %
Basophils Absolute: 0.1 10*3/uL (ref 0.0–0.1)
EOS ABS: 0.5 10*3/uL (ref 0.0–0.7)
EOS PCT: 6 %
HCT: 38.9 % — ABNORMAL LOW (ref 39.0–52.0)
Hemoglobin: 12.8 g/dL — ABNORMAL LOW (ref 13.0–17.0)
Lymphocytes Relative: 17 %
Lymphs Abs: 1.5 10*3/uL (ref 0.7–4.0)
MCH: 28.8 pg (ref 26.0–34.0)
MCHC: 32.9 g/dL (ref 30.0–36.0)
MCV: 87.4 fL (ref 78.0–100.0)
MONO ABS: 0.6 10*3/uL (ref 0.1–1.0)
MONOS PCT: 7 %
Neutro Abs: 5.9 10*3/uL (ref 1.7–7.7)
Neutrophils Relative %: 69 %
PLATELETS: 321 10*3/uL (ref 150–400)
RBC: 4.45 MIL/uL (ref 4.22–5.81)
RDW: 14.3 % (ref 11.5–15.5)
WBC: 8.6 10*3/uL (ref 4.0–10.5)

## 2017-04-13 LAB — COMPREHENSIVE METABOLIC PANEL
ALT: 18 U/L (ref 17–63)
AST: 21 U/L (ref 15–41)
Albumin: 3.3 g/dL — ABNORMAL LOW (ref 3.5–5.0)
Alkaline Phosphatase: 84 U/L (ref 38–126)
Anion gap: 6 (ref 5–15)
BILIRUBIN TOTAL: 0.9 mg/dL (ref 0.3–1.2)
BUN: 16 mg/dL (ref 6–20)
CO2: 27 mmol/L (ref 22–32)
CREATININE: 1.15 mg/dL (ref 0.61–1.24)
Calcium: 8.5 mg/dL — ABNORMAL LOW (ref 8.9–10.3)
Chloride: 105 mmol/L (ref 101–111)
GFR calc Af Amer: >60 mL/min (ref >60)
Glucose, Bld: 165 mg/dL — ABNORMAL HIGH (ref 65–99)
Potassium: 3.4 mmol/L — ABNORMAL LOW (ref 3.5–5.1)
Sodium: 138 mmol/L (ref 135–145)
TOTAL PROTEIN: 6.2 g/dL — AB (ref 6.5–8.1)

## 2017-04-13 LAB — I-STAT TROPONIN, ED: TROPONIN I, POC: 0.01 ng/mL (ref 0.00–0.08)

## 2017-04-13 LAB — PROTIME-INR
INR: 1.01
Prothrombin Time: 13.2 seconds (ref 11.4–15.2)

## 2017-04-13 LAB — GLUCOSE, CAPILLARY
GLUCOSE-CAPILLARY: 67 mg/dL (ref 65–99)
GLUCOSE-CAPILLARY: 75 mg/dL (ref 65–99)
GLUCOSE-CAPILLARY: 201 mg/dL — AB (ref 65–99)

## 2017-04-13 LAB — CBG MONITORING, ED: Glucose-Capillary: 161 mg/dL — ABNORMAL HIGH (ref 65–99)

## 2017-04-13 LAB — APTT: aPTT: 28 seconds (ref 24–36)

## 2017-04-13 MED ORDER — TRAZODONE HCL 50 MG PO TABS
50.0000 mg | ORAL_TABLET | Freq: Every evening | ORAL | Status: DC | PRN
  Administered 2017-04-13: 50 mg via ORAL
  Filled 2017-04-13: qty 1

## 2017-04-13 MED ORDER — ATORVASTATIN CALCIUM 40 MG PO TABS
40.0000 mg | ORAL_TABLET | Freq: Every day | ORAL | Status: DC
  Administered 2017-04-13: 40 mg via ORAL
  Filled 2017-04-13: qty 1

## 2017-04-13 MED ORDER — POTASSIUM CHLORIDE CRYS ER 10 MEQ PO TBCR
30.0000 meq | EXTENDED_RELEASE_TABLET | Freq: Once | ORAL | Status: AC
  Administered 2017-04-13: 21:00:00 30 meq via ORAL
  Filled 2017-04-13: qty 1

## 2017-04-13 MED ORDER — LORAZEPAM 2 MG/ML IJ SOLN
1.0000 mg | Freq: Once | INTRAMUSCULAR | Status: DC
  Filled 2017-04-13: qty 1

## 2017-04-13 MED ORDER — ASPIRIN 325 MG PO TABS
325.0000 mg | ORAL_TABLET | Freq: Every day | ORAL | Status: DC
  Administered 2017-04-13: 325 mg via ORAL
  Filled 2017-04-13: qty 1

## 2017-04-13 MED ORDER — STROKE: EARLY STAGES OF RECOVERY BOOK
Freq: Once | Status: AC
  Administered 2017-04-13: 18:00:00

## 2017-04-13 MED ORDER — SODIUM CHLORIDE 0.9% FLUSH
3.0000 mL | Freq: Two times a day (BID) | INTRAVENOUS | Status: DC
  Administered 2017-04-13 – 2017-04-14 (×2): 3 mL via INTRAVENOUS

## 2017-04-13 MED ORDER — ENOXAPARIN SODIUM 40 MG/0.4ML ~~LOC~~ SOLN
40.0000 mg | SUBCUTANEOUS | Status: DC
  Administered 2017-04-13: 40 mg via SUBCUTANEOUS
  Filled 2017-04-13: qty 0.4

## 2017-04-13 MED ORDER — HYDROXYZINE HCL 25 MG PO TABS: 25.0000 mg | ORAL_TABLET | Freq: Three times a day (TID) | ORAL | Status: DC | PRN

## 2017-04-13 MED ORDER — ASPIRIN EC 81 MG PO TBEC
81.0000 mg | DELAYED_RELEASE_TABLET | Freq: Every day | ORAL | Status: DC
  Administered 2017-04-14: 81 mg via ORAL
  Filled 2017-04-13: qty 1

## 2017-04-13 MED ORDER — DULOXETINE HCL 20 MG PO CPEP
40.0000 mg | ORAL_CAPSULE | Freq: Every day | ORAL | Status: DC
  Administered 2017-04-14: 40 mg via ORAL
  Filled 2017-04-13: qty 2

## 2017-04-13 MED ORDER — GABAPENTIN 300 MG PO CAPS
300.0000 mg | ORAL_CAPSULE | Freq: Every day | ORAL | Status: DC
  Administered 2017-04-13: 300 mg via ORAL
  Filled 2017-04-13: qty 1

## 2017-04-13 NOTE — Progress Notes (Signed)
Patient ID: [name], male   DOB: Dec 13, 1955, 61 y.o.   MRN: [id number]  Pt blood sugar was 65 this morning. Per protocol, patient was given one peanut butter packet, one graham cracker and some milk per request. Pt asymptomatic. Provider [place] notified. Recheck revealed a cbg of 75. Per providers verbal orders, pt given one more packet of peanut butter and graham cracker. Pt also plans to eat breakfast of eggs, bacon and grits. Will pass on to oncoming RN.

## 2017-04-13 NOTE — Progress Notes (Signed)
Report received from [facility].  Spoke to patient. Alert and oriented. Is discouraged regarding his inability to get Social Security Disability approved. NIH 0. Sitter in room. At this time patient denies suicidal ideations. Will continue to monitor.

## 2017-04-13 NOTE — H&P (Signed)
Date: 04/13/2017               Patient Name:  [name] MRN: [id number]  DOB: Mar 13, 1956 Age / Sex: 61 y.o., male   PCP: Berkley Harvey, NP         Medical Service: Internal Medicine Teaching Service         Attending Physician: Dr. Rayne Du att. providers found    First Contact: Dr. Maricela Bo Pager: [phone]  Second Contact: Dr. Tiburcio Pea  Pager: [phone]       After Hours (After 5p/  First Contact Pager: [phone]  weekends / holidays): Second Contact Pager: [phone]   Chief Complaint: Right upper extremity weakness   History of Present Illness: [name] is a 61 year old male with past medical history of cerebral hemorrhage with pipeline stent to the right ACA, right mca aneurysm hypertension, and hyperlipidemia who presented with right upper extremity, right thorax, and facial numbness that started this morning. The patient was last seen normal at 9:30 AM 04/13/2017 at behavioral health where he is being treated for suicidal ideation. The patient has been transferred to [facility] from [facility] behavioral health unit. This morning the patient states that both his blood glucose (glu=67) and blood pressure were low, and he felt unwell, however he felt better after eating breakfast. Soon after the patient stated that he started noticing heaviness and numbness in his right upper extremity, right thorax, face, and lips. The numbness lasted 1 hour. The patient states that he also noticed that he was feeling nauseous and had a mild tremble on his right side. He did not have any associated speech changes, vomiting, palpitations, chest pain,  Or shortness of breath. The patient is not on any home aspirin or other form of anticoagulation.  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 Stent with a pipleline flow diversion device on 09/20/15    August 2017 Implantable loop recorder inserted Plavix was stopped 11/17/16 by neurosurgery at [facility].   ED Course: The patient was not given tpa due to the symptoms being mild and as they are improving. Code stroke called. Neurology evaluated patient in ED and reported NIH of 0.  Meds:  No outpatient medications have been marked as taking for the 04/09/17 encounter [facility] Encounter).   Current Meds  Medication Sig  . amLODipine (NORVASC) 10 MG tablet Take 1 tablet (10 mg total) by mouth daily.  Marland Kitchen atorvastatin (LIPITOR) 40 MG tablet Take 1 tablet (40 mg total) by mouth daily at 6 PM. (Patient taking differently: Take 60 mg daily at 6 PM by mouth. )  . chlorthalidone (HYGROTON) 25 MG tablet Take 25 mg daily by mouth.   . clopidogrel (PLAVIX) 75 MG tablet Take 1 tablet (75 mg total) by mouth daily.  Marland Kitchen FLUoxetine (PROZAC) 20 MG capsule Take 1 capsule (20 mg total) by mouth daily. Take with 40 mg for a total of 60 mg  . gabapentin (NEURONTIN) 300 MG capsule Take 300 mg by mouth at bedtime.  Marland Kitchen glipiZIDE (GLUCOTROL XL) 10 MG 24 hr tablet Take 10 mg daily with breakfast by mouth.  . indapamide (LOZOL) 1.25 MG tablet Take 1.25 mg daily by mouth.   Marland Kitchen lisinopril (PRINIVIL,ZESTRIL) 40 MG tablet Take 40 mg by mouth daily.  . meclizine (ANTIVERT) 32 MG tablet Take 1 tablet (32 mg total) by mouth  3 (three) times daily as needed.  . metoprolol (LOPRESSOR) 50 MG tablet Take 1 tablet (50 mg total) by mouth 2 (two) times daily.  . pantoprazole (PROTONIX) 40 MG tablet Take 1 tablet (40 mg total) by mouth daily.   Allergies: Allergies as of 04/09/2017 - Review Complete 04/09/2017  Allergen Reaction Noted  . Metformin Diarrhea 08/07/2016   Past Medical History:  Diagnosis Date  . Allergy   . Anxiety   . Cataract   . Cerebral hemorrhage ([place])   . Chest pain, atypical    12/2003:  negative cardiolyte  . Depression   . Drug abuse ([place])   . Eczema   . Erectile dysfunction   . History of meniscal tear     bilateral  . HLD (hyperlipidemia)   . HTN (hypertension)   . Hx of tear of ACL (anterior cruciate ligament)    right  . Insomnia   . Olecranon bursitis of left elbow 10/2009   s/p I&D by Dr Maxie Better, initially assessed by Dr. Nori Riis   . Prediabetes   . Stroke ([place])   . Suicidal behavior 04/13/2017   Pt states taking a bunch of sleeping pills to end life   . TIA (transient ischemic attack)     Family History:   Family History  Problem Relation Age of Onset  . Stroke Mother   . Hypertension Mother   . Aneurysm Mother 45       Died of brain aneursym  . Heart failure Father   . Emphysema Father   . Diabetes Mellitus II Sister   . Colon cancer Neg Hx   . Rectal cancer Neg Hx   . Stomach cancer Neg Hx    Social History:   Social History   Socioeconomic History  . Marital status: Divorced    Spouse name: None  . Number of children: None  . Years of education: None  . Highest education level: None  Social Needs  . Financial resource strain: None  . Food insecurity - worry: None  . Food insecurity - inability: None  . Transportation needs - medical: None  . Transportation needs - non-medical: None  Occupational History  . None  Tobacco Use  . Smoking status: Never Smoker  . Smokeless tobacco: Never Used  Substance and Sexual Activity  . Alcohol use: No    Alcohol/week: 0.0 oz  . Drug use: No  . Sexual activity: No  Other Topics Concern  . None  Social History Narrative   Separated, has 2 daughters, works as a Forensic psychologist.  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   Physical Exam: Blood pressure 132/66, pulse 72, temperature 98.7 F (37.1 C), resp. rate 11, height 5\' 8"  (1.727 m),  weight 265 lb 2 oz (120.3 kg), SpO2 96 %.  Physical Exam  Constitutional: He appears well-developed and well-nourished. No distress.  HENT:  Head: Normocephalic and atraumatic.  Eyes: Conjunctivae are normal.  Cardiovascular: Normal rate, regular rhythm and normal heart sounds.  Pulmonary/Chest: Effort normal and breath sounds normal. No stridor. No respiratory distress.  Abdominal: Soft. Bowel sounds are normal. He exhibits no distension. There is no tenderness.  Neurological: He is alert. No sensory deficit.  He exhibits normal muscle tone.  Cranial nerves 2-12 intact, however the patient cannot hear from left ear.   Skin: No rash noted. He is not diaphoretic. No erythema.  Psychiatric: He has a normal mood and affect. His behavior is normal. Judgment and thought content normal.   APTT=28, PT=13.2, INR=1.01  Troponin: 0.01  CMP Latest Ref Rng & Units 04/13/2017 04/13/2017 04/08/2017  Glucose 65 - 99 mg/dL 170(H) 165(H) 163(H)  BUN 6 - 20 mg/dL 19 16 16   Creatinine 0.61 - 1.24 mg/dL 1.10 1.15 0.98  Sodium 135 - 145 mmol/L 138 138 140  Potassium 3.5 - 5.1 mmol/L 3.5 3.4(L) 3.6  Chloride 101 - 111 mmol/L 104 105 107  CO2 22 - 32 mmol/L - 27 22  Calcium 8.9 - 10.3 mg/dL - 8.5(L) 9.3  Total Protein 6.5 - 8.1 g/dL - 6.2(L) 7.6  Total Bilirubin 0.3 - 1.2 mg/dL - 0.9 0.7  Alkaline Phos 38 - 126 U/L - 84 99  AST 15 - 41 U/L - 21 19  ALT 17 - 63 U/L - 18 24   CBC    Component Value Date/Time   WBC 8.6 04/13/2017 1101   RBC 4.45 04/13/2017 1101   HGB 12.8 (L) 04/13/2017 1101   HCT 38.9 (L) 04/13/2017 1101   PLT 321 04/13/2017 1101   MCV 87.4 04/13/2017 1101   MCH 28.8 04/13/2017 1101   MCHC 32.9 04/13/2017 1101   RDW 14.3 04/13/2017 1101   LYMPHSABS 1.5 04/13/2017 1101   MONOABS 0.6 04/13/2017 1101   EOSABS 0.5 04/13/2017 1101   BASOSABS 0.1 04/13/2017 1101    EKG (04/13/17):  Sinus rhythm  CT Head (04/13/17): No acute intracranial abnormality, stable remote lacunar  infarcts of the basal ganglia bilaterally, stable white matter disease, aspects is 10/10  MRI brain (04/13/17): Pending  MRA head wo contrast (10/22/16): Recurrent right pericallosal ACA 5x72mm aneurysm, status post pipeline stent diversion. No emergent large vessel occlusion or severe stenosis.   Cerebral arteriogram:    Assessment & Plan by Problem:  60 yo m with pmh of hypertension, hyperlipidemia, cerebral hemorrhage with pipeline stent to the right ACA, and right mca aneurysm who presented with right upper extremity, right thorax, and facial numbness that started the morning of 04/13/17.  TIA (transient ischemic attack) MDD (major depressive disorder), recurrent severe, without psychosis ([place])  Transient ischemic attack The patient presents with mild upper extremity numbness and weakness that lasted approximately an hour duration. The patient has significant stroke risk factors which include hypertension, hyperlipidemia, small vessel disease, and obesity. The patient has a pipeline stent that was placed in April 2017.   Neurology saw the patient and recommended that the patient be placed back on antiplatelet therapy as there have been studies to show benefits in the setting of an aneurysm to prevent rupture.  Neurology will continue following patient.  -Hemoglobin A1c pending -Lipid panel -MRI, MRA without contrast pending -Transthoracic echo pending -Continuous cardiac monitoring-ordered -Carotid Dopplers pending -Barium swallow -PT/OT/speech consult ordered -Neurochecks q 2hrs  Major depressive disorder The patient was admitted at [facility] long hospital on Sunday, 04/08/2017 post attempted suicide. Patient was seen by psychiatry on 04/13/17 they felt that the patient's mood and affect has been improving.  They recommended daily assessment and evaluation of symptoms, group and milieu participation to work on coping skills.  -Continue Cymbalta 40 mg daily -Continue Neurontin 300  mg nightly -Continue trazodone 50 mg nightly as needed for insomnia -Continue Vistaril 25 mg every 8  hours as needed for anxiety  Hypertension The patient's blood pressure since admission has ranged 99-132/55-66. -hold home lisinopril 40 mg daily, metoprolol 50 mg twice daily, amlodipine 10 mg daily  Hyperlipidemia -Continue atorvastatin 40 mg daily  Dispo: Admit patient to Observation with expected length of stay less than 2 midnights  Signed: Lars Mage, MD Internal Medicine PGY1 [phone] 04/13/2017, 3:07 PM

## 2017-04-13 NOTE — Progress Notes (Addendum)
[facility] MD Progress Note  04/13/2017 10:42 AM TADEO BESECKER  MRN:  [id number] Subjective: patient reports he is feeling better,less depressed, less severely anxious . Denies suicidal ideations, and states that he has realized how much support he has from his adult children and how much he is a part of his grandson's life, and identifies family as a protective factor against suicide . Denies medication side effects at this time. Of note, today reports acute onset numbness on R arm .  Objective : I have discussed case with treatment team and have met with patient . Patient is presenting with improving mood and range of affect compared to admission . Today denies suicidal ideations. As above, seems more future oriented, and has mentioned the good times he has with his young grandson and how he feels he is " a father figure" for him. At this time identifies love for family as a protective factor against suicide. No agitated or disruptive behaviors . Of note, patient describes acute numbness on R forearm, arm. Denies other symptoms or motor weakness . I examined him along with RN.  BP 121/82, pulse ox 97 at room air . FS 201 ( had been low at 67 earlier this AM). No facial droop noted, no dysarthria, no weakness or lateralization noted.  He does have history of brain aneurysm, and has had CVA in the past. States he was on anticoagulant until a few weeks ago when his neurologist discontinued it .  Based on history, it was decided that best course of action is to send patient to ED for assessment .    Principal Problem:  Depression Diagnosis:   Patient Active Problem List   Diagnosis Date Noted  . Major depressive disorder, recurrent ([place]) [F33.9] 04/09/2017  . MDD (major depressive disorder), recurrent severe, without psychosis ([place]) [F33.2] 04/09/2017  . Family history of brain aneurysm [Z82.49]   . Intractable vomiting [R11.10] 10/20/2016  . History of intracranial aneurysm [Z86.79]   .  Hypokalemia [E87.6]   . AKI (acute kidney injury) ([place]) [N17.9]   . Prolonged QT interval [R94.31]   . Lactic acidosis [E87.2]   . Dysphagia [R13.10]   . Esophageal ring [K22.2]   . Gastroesophageal reflux disease with esophagitis [K21.0]   . Nausea and vomiting [R11.2] 10/01/2016  . 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 Severe episode of recurrent major depressive disorder, without psychotic features ([place]) [F33.2] 07/20/2016  . CVA (cerebral vascular accident) ([place]) [I63.9] 06/15/2016  . Abdominal pain [R10.9]   . Numbness [R20.0]   . Carpal tunnel syndrome, bilateral [G56.03] 12/16/2015  . Status post stroke [Z86.73] 11/12/2015  . Transient neurologic deficit [R29.818] 10/15/2015  . Cerebrovascular accident, late effects [I69.90] 10/07/2015  . Complicated migraine [V89.381]   . Anxiety state [F41.1]   . 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 Cerebral infarction due to embolism of right middle cerebral artery ([place]) [I63.411] 09/23/2015  . Cerebral infarction due to embolism of cerebral artery ([place]) [I63.40] 09/23/2015  . Gait disturbance, post-stroke [I69.398, R26.9]   . Drooping of mouth [R29.810]   . Cerebrovascular accident (CVA) due to embolism of right anterior cerebral artery ([place]) [I63.421]   . Benign essential HTN [I10]   . Migraine with aura and without status migrainosus, not intractable [G43.109]   . Tachypnea [R06.82]   . Prediabetes [R73.03]   . Acute blood loss anemia [D62]   . Brain aneurysm [I67.1] 09/20/2015  . Type 2 diabetes mellitus ([place]) [E11.9] 09/15/2015  . Leukocytosis [D72.829] 09/15/2015  .  Aneurysm, cerebral, nonruptured [I67.1] 09/15/2015  . TIA (transient ischemic attack) [G45.9] 09/14/2015  . Temporary cerebral vascular dysfunction [G93.9] 09/14/2015  . H/O transient cerebral ischemia [Z86.73] 07/29/2015  . At risk for falling [Z91.81] 07/29/2015  . Bilateral hearing loss [H91.93] 10/25/2014  . Abnormal fear [F40.9] 08/08/2012  .  Headache, migraine [G43.909] 08/08/2012  . Arthritis, degenerative [M19.90] 08/08/2012  . History of tear of ACL (anterior cruciate ligament) [Z87.828] 03/08/2011  . History of knee problem [Z87.39] 03/08/2011  . Depression [F32.9] 09/07/2010  . Depressive disorder, not elsewhere classified [F32.9] 09/07/2010  . URI (upper respiratory infection) [J06.9] 08/24/2010  . Hearing loss [H91.90] 09/06/2006  . Hyperlipidemia [E78.5] 04/23/2006  . Essential hypertension [I10] 04/23/2006  . Psychophysiological insomnia [F51.04] 04/23/2006   Total Time spent with patient: 20 minutes  Past Medical History:  Past Medical History:  Diagnosis Date  . Allergy   . Anxiety   . Cataract   . Cerebral hemorrhage ([place])   . Chest pain, atypical    12/2003:  negative cardiolyte  . Depression   . Drug abuse ([place])   . Eczema   . Erectile dysfunction   . History of meniscal tear    bilateral  . HLD (hyperlipidemia)   . HTN (hypertension)   . Hx of tear of ACL (anterior cruciate ligament)    right  . Insomnia   . Olecranon bursitis of left elbow 10/2009   s/p I&D by Dr Maxie Better, initially assessed by Dr. Nori Riis   . Prediabetes   . Stroke ([place])   . TIA (transient ischemic attack)     Past Surgical History:  Procedure Laterality Date  . EMBOLIZATION         (RADIOLOGY WITH ANESTHESIA) N/A 09/20/2015   Performed by Luanne Bras, MD at [facility] OR  . ESOPHAGOGASTRODUODENOSCOPY (EGD) N/A 10/03/2016   Performed by Irene Shipper, MD at [place]  . INCISE AND DRAIN ABCESS     L elbow due to cellulitis/bursitis  . INNER EAR SURGERY    . IR GENERIC HISTORICAL  12/28/2015   IR ANGIO VERTEBRAL SEL VERTEBRAL UNI L MOD SED 12/28/2015 Luanne Bras, MD [facility]-INTERV RAD  . IR GENERIC HISTORICAL  12/28/2015   IR ANGIO VERTEBRAL SEL SUBCLAVIAN INNOMINATE UNI R MOD SED 12/28/2015 Luanne Bras, MD [facility]  . IR GENERIC HISTORICAL  12/28/2015   IR ANGIO INTRA EXTRACRAN SEL INTERNAL CAROTID BILAT MOD SED 12/28/2015  Luanne Bras, MD [facility]  . KNEE ARTHROSCOPY    . Loop Recorder Insertion N/A 12/30/2015   Performed by Thompson Grayer, MD at [place] [facility]  . SKIN TAG REMOVAL     11 removed  . TRANSESOPHAGEAL ECHOCARDIOGRAM (TEE) N/A 12/30/2015   Performed by Larey Dresser, MD at [facility] ENDOSCOPY   Family History:  Family History  Problem Relation Age of Onset  . Stroke Mother   . Hypertension Mother   . Aneurysm Mother 30       Died of brain aneursym  . Heart failure Father   . Emphysema Father   . Diabetes Mellitus II Sister   . Colon cancer Neg Hx   . Rectal cancer Neg Hx   . Stomach cancer Neg Hx    Social History:  Social History   Substance and Sexual Activity  Alcohol Use No  . Alcohol/week: 0.0 oz     Social History   Substance and Sexual Activity  Drug Use No    Social History   Socioeconomic History  .  Marital status: Divorced    Spouse name: None  . Number of children: None  . Years of education: None  . Highest education level: None  Social Needs  . Financial resource strain: None  . Food insecurity - worry: None  . Food insecurity - inability: None  . Transportation needs - medical: None  . Transportation needs - non-medical: None  Occupational History  . None  Tobacco Use  . Smoking status: Never Smoker  . Smokeless tobacco: Never Used  Substance and Sexual Activity  . Alcohol use: No    Alcohol/week: 0.0 oz  . Drug use: No  . Sexual activity: No  Other Topics Concern  . None  Social History Narrative   Separated, has 2 daughters, works as a Forensic psychologist.  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 acetaminophen (TYLENOL) tablet 650 mg  650 mg Oral Q6H PRN Ethelene Hal, NP   650 mg at 04/10/17 7371  . alum & mag hydroxide-simeth (MAALOX/MYLANTA) 200-200-20 MG/5ML suspension 30 mL  30 mL Oral Q4H PRN Ethelene Hal, NP      . amLODipine (NORVASC) tablet 10 mg  10 mg Oral Daily Ethelene Hal, NP   10 mg at 04/12/17 0809  . atorvastatin (LIPITOR) tablet 40 mg  40 mg Oral q1800 Ethelene Hal, NP   40 mg at 04/12/17 1708  . chlorthalidone (HYGROTON) tablet 25 mg  25 mg Oral Daily Ethelene Hal, NP   25 mg at 04/12/17 0626  . DULoxetine (CYMBALTA) DR capsule 40 mg  40 mg Oral Daily Cobos, Myer Peer, MD   40 mg at 04/13/17 9485  . gabapentin (NEURONTIN) capsule 300 mg  300 mg Oral QHS Ethelene Hal, NP   300 mg at 04/12/17 2117  . glipiZIDE (GLUCOTROL XL) 24 hr tablet 10 mg  10 mg Oral Q breakfast Ethelene Hal, NP   10 mg at 04/13/17 4627  . hydrOXYzine (ATARAX/VISTARIL) tablet 25 mg  25 mg Oral TID PRN Ethelene Hal, NP   25 mg at 04/12/17 2117  . lisinopril (PRINIVIL,ZESTRIL) tablet 40 mg  40 mg Oral Daily Ethelene Hal, NP   40 mg at 04/12/17 0810  . magnesium hydroxide (MILK OF MAGNESIA) suspension 30 mL  30 mL Oral Daily PRN Ethelene Hal, NP      . metoprolol tartrate (LOPRESSOR) tablet 25 mg  25 mg Oral BID Cobos, Myer Peer, MD   25 mg at 04/12/17 1709  . traZODone (DESYREL) tablet 50 mg  50 mg Oral QHS PRN Ethelene Hal, NP   50 mg at 04/12/17 2117    Lab Results:  Results for orders placed or performed during the hospital encounter of 04/09/17 (from the past 48 hour(s))  Glucose, capillary     Status: None   Collection Time: 04/12/17  6:18 AM  Result Value Ref Range   Glucose-Capillary 73 65 - 99 mg/dL  Glucose, capillary     Status: None   Collection Time: 04/13/17  6:33 AM  Result Value Ref Range   Glucose-Capillary 67 65 - 99 mg/dL   Comment 1 Notify RN   Glucose, capillary     Status: None   Collection Time: 04/13/17  6:52 AM  Result Value Ref Range    Glucose-Capillary 75 65 - 99 mg/dL  Glucose, capillary     Status: Abnormal   Collection Time: 04/13/17 10:05 AM  Result Value Ref Range   Glucose-Capillary 201 (H) 65 - 99 mg/dL    Blood Alcohol level:  Lab Results  Component Value Date   ETH <10 04/08/2017   ETH <10 37/48/2707    Metabolic Disorder Labs: Lab Results  Component Value Date   HGBA1C 8.0 (H) 06/16/2016   MPG 183 06/16/2016   MPG 131 12/28/2015   No results found for: PROLACTIN Lab Results  Component Value Date   CHOL 173 06/16/2016   TRIG 203 (H) 06/16/2016   HDL 38 (L) 06/16/2016   CHOLHDL 4.6 06/16/2016   VLDL 41 (H) 06/16/2016   LDLCALC 94 06/16/2016   LDLCALC 140 (H) 12/28/2015    Physical Findings: AIMS: Facial and Oral Movements Muscles of Facial Expression: None, normal Lips and Perioral Area: None, normal Jaw: None, normal Tongue: None, normal,Extremity Movements Upper (arms, wrists, hands, fingers): None, normal Lower (legs, knees, ankles, toes): None, normal, Trunk Movements Neck, shoulders, hips: None, normal, Overall Severity Severity of abnormal movements (highest score from questions above): None, normal Incapacitation due to abnormal movements: None, normal Patient's awareness of abnormal movements (rate only patient's report): No Awareness, Dental Status Current problems with teeth and/or dentures?: No Does patient usually wear dentures?: No  CIWA:    COWS:     Musculoskeletal: Strength & Muscle Tone: within normal limits Gait & Station: normal Patient leans: N/A  Psychiatric Specialty Exam: Physical Exam  ROS denies headache today, no chest pain, no shortness of breath, denies bleeding . As above , reports numbness on right arm   Blood pressure (!) 120/100, pulse 93, temperature (!) 97.4 F (36.3 C), temperature source Oral, resp. rate 18, height _0  (1.727 m), weight 116.1 kg (256 lb).Body mass index is 38.92 kg/m.  General Appearance: improving grooming   Eye Contact:   Good  Speech:  Normal Rate  Volume:  Normal  Mood:  gradually improving   Affect:  more reactive   Thought Process:  Linear and Descriptions of Associations: Intact  Orientation:  Other:  fully alert and attentive   Thought Content:  no hallucinations, no delusions, less ruminative   Suicidal Thoughts:  No- at this time denies suicidal or self injurious ideations, denies homicidal or violent ideations, contracts for safety on unit   Homicidal Thoughts:  No  Memory:  recent and remote grossly intact   Judgement:  improving   Insight:   improving   Psychomotor Activity:  Normal  Concentration:  Concentration: Good and Attention Span: Good  Recall:  Good  Fund of Knowledge:  Good  Language:  Good  Akathisia:  Negative  Handed:  Right  AIMS (if indicated):     Assets:  Desire for Improvement Resilience  ADL's:  Intact  Cognition:  WNL  Sleep:  Number of Hours: 6.5   Assessment - patient is presenting with improving mood and range of affect, and at this time denies suicidal ideations. Tolerating medications well thus far, no side effects. He does remain  anxious, concerned about new onset numbness on right arm . No weakness, no lateralization, no facial droop, no dysarthria noted. He does have history of CVA in the past .    Treatment Plan Summary: Treatment Plan reviewed as below today 11/16 Daily contact with patient to assess and evaluate symptoms and progress  in treatment, Medication management, Plan inpatient treatment  and medications as below Treatment team working on disposition planning options  Encourage group and milieu participation to work on coping skills and symptom reduction  Continue Cymbalta 40 mgrs QDAY for depression and anxiety  Continue Neurontin 300 mgrs QHS for pain and anxiety  Continue Trazodone 50 mgrs QHS PRN for insomnia as needed  Continue VIstaril 25 mgrs Q8 hours PRN for anxiety as needed  Patient transported to ED due to above neurological complaint .   Jenne Campus, MD 04/13/2017, 10:42 AM   Patient ID: [name], male   DOB: 1955-09-13, 61 y.o.   MRN: [id number]

## 2017-04-13 NOTE — ED Triage Notes (Signed)
PER EMS pt is from [facility] behavior health and was last seen normal at 0930.  He presented with mild right sided weakness and numbness in his right arm.  The numbness moved up his arm to right side of face.  Pt is AOx4  NAD noted at this time.

## 2017-04-13 NOTE — Progress Notes (Signed)
New Admission Note:  Arrival Method: arrived on stretcher  Mental Orientation: alert & oriented x 4 Telemetry:3w24 Assessment: Completed Skin:  IV: Left hand  Pain:0/10 Safety Measures: Safety Fall Prevention Plan was given, discussed. Admission: Completed 3w31: Patient has been orientated to the room, unit and the staff. Family: updated   Orders have been reviewed and implemented. Will continue to monitor the patient. Call light has been placed within reach and bed alarm has been activated.   Arta Silence ,RN

## 2017-04-13 NOTE — ED Provider Notes (Signed)
[facility] EMERGENCY DEPARTMENT Provider Note   CSN: [id number] Arrival date & time: 04/13/17  1054   An emergency department physician performed an initial assessment on this suspected stroke patient at 1044.  History   Chief Complaint Chief Complaint  Patient presents with  . Code Stroke    HPI [name] is a 61 y.o. male.  Patient is a 61 year old male with a history of cerebral hemorrhage, brain aneurysm, hypertension, hyperlipidemia, diabetes, drug abuse, recent depression with suicidal ideation currently at behavioral health, TIA who is presenting today with sudden onset of right-sided facial droop, weakness, right upper extremity heaviness and weakness that started approximately 1 hour prior to arrival.  Patient was noted to have a blood 1 hour prior to arrival.  Patient was noted to have a blood sugar of 60 and was given something to drink but symptoms did not improve.  Pt denies any chest pain, Abdominal pain, Or shortness of breath.   The history is provided by the patient.    Past Medical History:  Diagnosis Date  . Allergy   . Anxiety   . Cataract   . Cerebral hemorrhage ([place])   . Chest pain, atypical    12/2003:  negative cardiolyte  . Depression   . Drug abuse ([place])   . Eczema   . Erectile dysfunction   . History of meniscal tear    bilateral  . HLD (hyperlipidemia)   . HTN (hypertension)   . Hx of tear of ACL (anterior cruciate ligament)    right  . Insomnia   . Olecranon bursitis of left elbow 10/2009   s/p I&D by Dr Maxie Better, initially assessed by Dr. Nori Riis   . Prediabetes   . Stroke ([place])   . Suicidal behavior 04/13/2017   Pt states taking a bunch of sleeping pills to end life   . TIA (transient ischemic attack)     Patient Active Problem List   Diagnosis Date Noted  . Major depressive disorder, recurrent ([place]) 04/09/2017  . MDD (major depressive disorder), recurrent severe, without psychosis ([place]) 04/09/2017  . Family  history of brain aneurysm   . Intractable vomiting 10/20/2016  . History of intracranial aneurysm   . Hypokalemia   . AKI (acute kidney injury) ([place])   . Prolonged QT interval   . Lactic acidosis   . Dysphagia   . Esophageal ring   . Gastroesophageal reflux disease with esophagitis   . Nausea and vomiting 10/01/2016  . 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 Severe episode of recurrent major depressive disorder, without psychotic features ([place]) 07/20/2016  . CVA (cerebral vascular accident) ([place]) 06/15/2016  . Abdominal pain   . Numbness   . Carpal tunnel syndrome, bilateral 12/16/2015  . Status post stroke 11/12/2015  . Transient neurologic deficit 10/15/2015  . Cerebrovascular accident, late effects 10/07/2015  . Complicated migraine   . Anxiety state   . 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 Cerebral infarction due to embolism of right middle cerebral artery ([place]) 09/23/2015  . Cerebral infarction due to embolism of cerebral artery ([place]) 09/23/2015  . Gait disturbance, post-stroke   . Drooping of mouth   . Cerebrovascular accident (CVA) due to embolism of right anterior cerebral artery ([place])   . Benign essential HTN   . Migraine with aura and without status migrainosus, not intractable   . Tachypnea   . Prediabetes   . Acute blood loss anemia   . Brain aneurysm 09/20/2015  . Type 2 diabetes  mellitus ([place]) 09/15/2015  . Leukocytosis 09/15/2015  . Aneurysm, cerebral, nonruptured 09/15/2015  . TIA (transient ischemic attack) 09/14/2015  . Temporary cerebral vascular dysfunction 09/14/2015  . H/O transient cerebral ischemia 07/29/2015  . At risk for falling 07/29/2015  . Bilateral hearing loss 10/25/2014  . Abnormal fear 08/08/2012  . Headache, migraine 08/08/2012  . Arthritis, degenerative 08/08/2012  . History of tear of ACL (anterior cruciate ligament) 03/08/2011  . History of knee problem 03/08/2011  . Depression 09/07/2010  . Depressive disorder, not elsewhere  classified 09/07/2010  . URI (upper respiratory infection) 08/24/2010  . Hearing loss 09/06/2006  . Hyperlipidemia 04/23/2006  . Essential hypertension 04/23/2006  . Psychophysiological insomnia 04/23/2006    Past Surgical History:  Procedure Laterality Date  . EMBOLIZATION         (RADIOLOGY WITH ANESTHESIA) N/A 09/20/2015   Performed by Luanne Bras, MD at [facility] OR  . ESOPHAGOGASTRODUODENOSCOPY (EGD) N/A 10/03/2016   Performed by Irene Shipper, MD at [place]  . INCISE AND DRAIN ABCESS     L elbow due to cellulitis/bursitis  . INNER EAR SURGERY    . IR GENERIC HISTORICAL  12/28/2015   IR ANGIO VERTEBRAL SEL VERTEBRAL UNI L MOD SED 12/28/2015 Luanne Bras, MD [facility] RAD  . IR GENERIC HISTORICAL  12/28/2015   IR ANGIO VERTEBRAL SEL SUBCLAVIAN INNOMINATE UNI R MOD SED 12/28/2015 Luanne Bras, MD [facility]  . IR GENERIC HISTORICAL  12/28/2015   IR ANGIO INTRA EXTRACRAN SEL INTERNAL CAROTID BILAT MOD SED 12/28/2015 Luanne Bras, MD [facility]  . KNEE ARTHROSCOPY    . Loop Recorder Insertion N/A 12/30/2015   Performed by Thompson Grayer, MD at [facility]  . SKIN TAG REMOVAL     11 removed  . TRANSESOPHAGEAL ECHOCARDIOGRAM (TEE) N/A 12/30/2015   Performed by Larey Dresser, MD at [facility]    Prior to Admission medications   Medication Sig Start Date End Date Taking? Authorizing Provider  amLODipine (NORVASC) 10 MG tablet Take 1 tablet (10 mg total) by mouth daily. 12/31/15   Arrien, Jimmy Picket, MD  atorvastatin (LIPITOR) 20 MG tablet Take 20 mg daily by mouth.    [provider]  atorvastatin (LIPITOR) 40 MG tablet Take 1 tablet (40 mg total) by mouth daily at 6 PM. 12/31/15   Arrien, Jimmy Picket, MD  chlorthalidone (HYGROTON) 25 MG tablet Take 25 mg daily by mouth.  01/18/17   [provider]  clopidogrel (PLAVIX) 75 MG tablet Take 1 tablet (75 mg total) by mouth daily. Patient not taking: Reported on  03/12/2017 09/30/15   Angiulli, Lavon Paganini, PA-C  FLUoxetine (PROZAC) 20 MG capsule Take 1 capsule (20 mg total) by mouth daily. Take with 40 mg for a total of 60 mg 09/06/16   Eksir, Richard Miu, MD  gabapentin (NEURONTIN) 300 MG capsule Take 300 mg by mouth at bedtime.    [provider]  glipiZIDE (GLUCOTROL XL) 10 MG 24 hr tablet Take 10 mg daily with breakfast by mouth.    [provider]  indapamide (LOZOL) 1.25 MG tablet Take 1.25 mg daily by mouth.  02/20/17   [provider]  lisinopril (PRINIVIL,ZESTRIL) 40 MG tablet Take 40 mg by mouth daily. 12/28/16   [provider]  meclizine (ANTIVERT) 32 MG tablet Take 1 tablet (32 mg total) by mouth 3 (three) times daily as needed. Patient not taking: Reported on 04/02/2017 10/24/16  Sela Hilding, MD  metoprolol (LOPRESSOR) 50 MG tablet Take 1 tablet (50 mg total) by mouth 2 (two) times daily. 12/31/15   Arrien, Jimmy Picket, MD  pantoprazole (PROTONIX) 40 MG tablet Take 1 tablet (40 mg total) by mouth daily. Patient not taking: Reported on 04/02/2017 10/06/16   Sela Hilding, MD    Family History Family History  Problem Relation Age of Onset  . Stroke Mother   . Hypertension Mother   . Aneurysm Mother 69       Died of brain aneursym  . Heart failure Father   . Emphysema Father   . Diabetes Mellitus II Sister   . Colon cancer Neg Hx   . Rectal cancer Neg Hx   . Stomach cancer Neg Hx     Social History Social History   Tobacco Use  . Smoking status: Never Smoker  . Smokeless tobacco: Never Used  Substance Use Topics  . Alcohol use: No    Alcohol/week: 0.0 oz  . Drug use: No     Allergies   Metformin   Review of Systems Review of Systems  All other systems reviewed and are negative.    Physical Exam Updated Vital Signs BP (!) 109/50 (BP Location: Right Arm)   Pulse 71   Temp 98.6 F (37 C) (Oral)   Resp 17   Ht 5\' 8"  (1.727 m)   Wt 120.3 kg (265 lb 2 oz)   SpO2 95%    BMI 40.31 kg/m   Physical Exam  Constitutional: He is oriented to person, place, and time. He appears well-developed and well-nourished. No distress.  HENT:  Head: Normocephalic and atraumatic.  Mouth/Throat: Oropharynx is clear and moist.  Eyes: Conjunctivae and EOM are normal. Pupils are equal, round, and reactive to light.  Neck: Normal range of motion. Neck supple.  Cardiovascular: Normal rate, regular rhythm and intact distal pulses.  No murmur heard. Pulmonary/Chest: Effort normal and breath sounds normal. No respiratory distress. He has no wheezes. He has no rales.  Abdominal: Soft. He exhibits no distension. There is no tenderness. There is no rebound and no guarding.  Musculoskeletal: Normal range of motion. He exhibits no edema or tenderness.  Neurological: He is alert and oriented to person, place, and time.  No facial droop, numbness or weakness.  No pronator drift and 5/5 strenght in the bilateral upper and lower ext.  No sensation deficits.  Skin: Skin is warm and dry. No rash noted. No erythema.  Psychiatric: He has a normal mood and affect. His behavior is normal.  Nursing note and vitals reviewed.    ED Treatments / Results  Labs (all labs ordered are listed, but only abnormal results are displayed) Labs Reviewed  GLUCOSE, CAPILLARY - Abnormal; Notable for the following components:      Result Value   Glucose-Capillary 106 (*)    All other components within normal limits  GLUCOSE, CAPILLARY - Abnormal; Notable for the following components:   Glucose-Capillary 201 (*)    All other components within normal limits  I-STAT CHEM 8, ED - Abnormal; Notable for the following components:   Glucose, Bld 170 (*)    Calcium, Ion 0.94 (*)    Hemoglobin 12.9 (*)    HCT 38.0 (*)    All other components within normal limits  TSH  GLUCOSE, CAPILLARY  GLUCOSE, CAPILLARY  GLUCOSE, CAPILLARY  GLUCOSE, CAPILLARY  I-STAT TROPONIN, ED    EKG  EKG Interpretation None  Radiology Ct Head Code Stroke Wo Contrast  Result Date: 04/13/2017 CLINICAL DATA:  Code stroke. New onset right hand and face numbness. Previous infarcts. Altered level of consciousness. EXAM: CT HEAD WITHOUT CONTRAST TECHNIQUE: Contiguous axial images were obtained from the base of the skull through the vertex without intravenous contrast. COMPARISON:  MRI brain 10/22/2016 FINDINGS: Brain: Multiple remote lacunar infarcts are again noted in the basal ganglia. Scattered white matter disease is present. The basal ganglia are otherwise stable. Insular cortex is normal bilaterally. No focal cortical lesions are present. Vascular: Atherosclerotic calcifications are present at the cavernous internal carotid artery is bilaterally. There is no hyperdense vessel. Skull: Calvarium is intact. No focal lytic or blastic lesions are present. Sinuses/Orbits: The scratched at scattered mucosal thickening is present in the maxillary sinuses bilaterally. There is scattered opacification of left ethmoid air cells. The remaining paranasal sinuses are clear. Left mastoid surgery is noted. The right mastoid air cells are clear. ASPECTS [facility] Stroke Program Early CT Score) - Ganglionic level infarction (caudate, lentiform nuclei, internal capsule, insula, M1-M3 cortex): 7/7 - Supraganglionic infarction (M4-M6 cortex): 3/3 Total score (0-10 with 10 being normal): 10/10 IMPRESSION: 1. No acute intracranial abnormality. 2. Stable remote lacunar infarcts of the basal ganglia bilaterally. 3. Stable white matter disease. 4. ASPECTS is 10/10 Electronically Signed   By: San Morelle M.D.   On: 04/13/2017 11:07    Procedures Procedures (including critical care time)  Medications Ordered in ED Medications  acetaminophen (TYLENOL) tablet 650 mg (650 mg Oral Given 04/10/17 0814)  alum & mag hydroxide-simeth (MAALOX/MYLANTA) 200-200-20 MG/5ML suspension 30 mL (not administered)  hydrOXYzine (ATARAX/VISTARIL) tablet 25  mg (25 mg Oral Given 04/12/17 2117)  magnesium hydroxide (MILK OF MAGNESIA) suspension 30 mL (not administered)  traZODone (DESYREL) tablet 50 mg (50 mg Oral Given 04/12/17 2117)  amLODipine (NORVASC) tablet 10 mg (10 mg Oral Not Given 04/13/17 0923)  atorvastatin (LIPITOR) tablet 40 mg (40 mg Oral Given 04/12/17 1708)  chlorthalidone (HYGROTON) tablet 25 mg (25 mg Oral Not Given 04/13/17 0921)  gabapentin (NEURONTIN) capsule 300 mg (300 mg Oral Given 04/12/17 2117)  glipiZIDE (GLUCOTROL XL) 24 hr tablet 10 mg (10 mg Oral Given 04/13/17 0922)  lisinopril (PRINIVIL,ZESTRIL) tablet 40 mg (40 mg Oral Not Given 04/13/17 0923)  DULoxetine (CYMBALTA) DR capsule 40 mg (40 mg Oral Given 04/13/17 0922)  metoprolol tartrate (LOPRESSOR) tablet 25 mg (25 mg Oral Not Given 04/13/17 6659)  haloperidol (HALDOL) tablet 5 mg (5 mg Oral Given 04/11/17 2226)  hydrOXYzine (ATARAX/VISTARIL) tablet 25 mg (25 mg Oral Given 04/11/17 2225)  hydrOXYzine (ATARAX/VISTARIL) tablet 25 mg (25 mg Oral Given 04/12/17 2226)     Initial Impression / Assessment and Plan / ED Course  I have reviewed the triage vital signs and the nursing notes.  Pertinent labs & imaging results that were available during my care of the patient were reviewed by me and considered in my medical decision making (see chart for details).     Patient is presenting as a code stroke.  Neurology is present upon patient's arrival.  Patient's symptoms occurred approximately 1 hour prior to arrival.  He does have a significant history of multiple stroke risk factors including potential brain aneurysm and prior TIAs.  Patient does not take any anticoagulation including aspirin at this time.  Patient developed right-sided arm heaviness, numbness and facial tingling.  Upon arrival to the emergency room he did have notable right-sided facial droop which by my examination had already resolved.  Patient is an [place] of 0 currently.  CT of the brain shows no acute  findings.  Blood sugar is within normal limits.  Neurology recommended admission for stroke workup.  Patient is amenable to treatment.  He was at behavioral health for major depressive disorder and suicidal ideation but states he was to be discharged today because he was feeling much better and does not want to hurt himself any longer.  Final Clinical Impressions(s) / ED Diagnoses   Final diagnoses:  TIA (transient ischemic attack)    ED Discharge Orders    None       Blanchie Dessert, MD 04/13/17 1930

## 2017-04-13 NOTE — Consult Note (Signed)
Neurology Consultation Reason for Consult: Right-sided weakness and numbness Referring Physician: Maryan Rued, W  CC: Right-sided weakness and numbness  History is obtained from: Patient  HPI: [name] is a 61 y.o. 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  According to the patient, he reports that he was told to stop his antiplatelet therapy at that time.  This morning, he is currently being treated at behavioral health for suicidal ideation, he developed acute onset right-sided numbness and weakness that is now improving.   LKW: 9:30 AM tpa given?: no, mild, improving symptoms    ROS: A 14 point ROS was performed and is negative except as noted in the HPI.   Past Medical History:  Diagnosis Date  . Allergy   . Anxiety   . Cataract   . Cerebral hemorrhage ([place])   . Chest pain, atypical    12/2003:  negative cardiolyte  . Depression   . Drug abuse ([place])   . Eczema   . Erectile dysfunction   . History of meniscal tear    bilateral  . HLD (hyperlipidemia)   . HTN (hypertension)   . Hx of tear of ACL (anterior cruciate ligament)    right  . Insomnia   . Olecranon bursitis of left elbow 10/2009   s/p I&D by Dr Maxie Better, initially assessed by Dr. Nori Riis   . Prediabetes   . Stroke ([place])   . Suicidal behavior 04/13/2017   Pt states taking a bunch of sleeping pills to end life   . TIA (transient ischemic attack)      Family History  Problem Relation Age of Onset  . Stroke Mother   . Hypertension Mother   . Aneurysm Mother 22       Died of brain aneursym  . Heart failure Father   . Emphysema Father   . Diabetes Mellitus II Sister   . Colon cancer Neg Hx   . Rectal cancer Neg Hx   . Stomach cancer Neg Hx      Social History:  reports that  has  never smoked. he has never used smokeless tobacco. He reports that he does not drink alcohol or use drugs.   Exam: Current vital signs: BP (!) 109/50 (BP Location: Right Arm)   Pulse 71   Temp 98.6 F (37 C) (Oral)   Resp 17   Ht 5\' 8"  (1.727 m)   Wt 120.3 kg (265 lb 2 oz)   SpO2 95%   BMI 40.31 kg/m  Vital signs in last 24 hours: Temp:  [97.4 F (36.3 C)-98.6 F (37 C)] 98.6 F (37 C) (11/16 1116) Pulse Rate:  [55-93] 71 (11/16 1116) Resp:  [17-18] 17 (11/16 1116) BP: (103-120)/(50-100) 109/50 (11/16 1116) SpO2:  [95 %] 95 % (11/16 1116) Weight:  [120.3 kg (265 lb 2 oz)] 120.3 kg (265 lb 2 oz) (11/16 1117)   Physical Exam  Constitutional: Appears well-developed and well-nourished.  Psych: Affect appropriate to situation Eyes: No scleral injection HENT: No OP obstrucion Head: Normocephalic.  Cardiovascular: Normal rate and regular rhythm.  Respiratory: Effort normal and breath sounds normal to anterior ascultation GI: Soft.  No distension. There is no tenderness.  Skin: WDI  Neuro: Mental Status: Patient is awake, alert, oriented to person, place, month, year, and situation. Patient  is able to give a clear and coherent history. No signs of aphasia or neglect Cranial Nerves: II: Visual Fields are full. Pupils are equal, round, and reactive to light.   III,IV, VI: EOMI without ptosis or diploplia.  V: Facial sensation was mildly decreased on arrival, but this improved. VII: At the bridge, he had a mild right facial droop, which subsequently improved over the course of my exam. VIII: hearing is intact to voice X: Uvula elevates symmetrically XI: Shoulder shrug is symmetric. XII: tongue is midline without atrophy or fasciculations.  Motor: Tone is normal. Bulk is normal. 5/5 strength was present in all four extremities.  Sensory: Sensation is symmetric to light touch and temperature in the arms and legs. Cerebellar: FNF intact bilaterally      I have  reviewed labs in epic and the results pertinent to this consultation are: CMP-unremarkable  I have reviewed the images obtained: CT head-unremarkable  Impression: 61 year old male with a history of stroke and stent who presents with right-sided numbness and weakness which is now improving.  I suspect that this was TIA.  In this setting, I feel that antiplatelet therapy is an important component to his stroke prevention regimen, and there is increasing data that not only is it safe in the setting of aneurysm, but also can help prevent aneurysm rupture.  At this time, I would favor an observation admission as I suspect he is at high risk, with workup to ensure that we are maximizing his secondary stroke prevention.  Recommendations: 1. HgbA1c, fasting lipid panel 2. MRI, MRA  of the brain without contrast 3. Frequent neuro checks 4. Echocardiogram 5. Carotid dopplers 6. Prophylactic therapy-Antiplatelet med: Aspirin - dose 325mg  PO or 300mg  PR 7. Risk factor modification 8. Telemetry monitoring 9. PT consult, OT consult, Speech consult 10. please page stroke NP  Or  PA  Or MD  from 8am -4 pm as this patient will be followed by the stroke team at this point.   You can look them up on [email or web address]      Roland Rack, MD [facility] [phone]  If 7pm- 7am, please page neurology on call as listed in [place].

## 2017-04-13 NOTE — Progress Notes (Addendum)
Pt c/o 'd  Numbness in his right arm. He is fully conscious. He is anxious and worried. He reports Skin warm and dry 121/82 HRR 87 PO2 97% RA CBG 201 Pt reports he is s/p 3 CVA's and is no longer on anti coagulation therapy. He is fully alert. He denies any chest pain and / or numbness anywhere else. Physician performing neuro checks and  No deficti identified.Dr. Parke Poisson directed writer to call non emergency 911 and send pt to hospital. [facility] Letitia Libra) made aware and non emergency 911 called by this writer. 1026  911 at [facility] to get pt. Verbal reprots called to [facility] rn charge at [facility] ED and per pt request writer called pt' contact Rhodia Albright [phone].

## 2017-04-13 NOTE — ED Notes (Signed)
Pt in gown and on monitor 

## 2017-04-14 ENCOUNTER — Observation Stay (HOSPITAL_BASED_OUTPATIENT_CLINIC_OR_DEPARTMENT_OTHER): Payer: Medicaid Other

## 2017-04-14 ENCOUNTER — Inpatient Hospital Stay (HOSPITAL_COMMUNITY)
Admission: AD | Admit: 2017-04-14 | Discharge: 2017-04-16 | DRG: 885 | Disposition: A | Discharge status: Home / Self Care | Payer: Medicaid Other | Source: Intra-hospital | Attending: Psychiatry | Admitting: Psychiatry

## 2017-04-14 ENCOUNTER — Other Ambulatory Visit: Payer: Self-pay

## 2017-04-14 ENCOUNTER — Observation Stay (HOSPITAL_COMMUNITY): Payer: Medicaid Other

## 2017-04-14 ENCOUNTER — Encounter (HOSPITAL_COMMUNITY): Payer: Self-pay

## 2017-04-14 ENCOUNTER — Encounter (HOSPITAL_COMMUNITY): Payer: Self-pay | Admitting: *Deleted

## 2017-04-14 DIAGNOSIS — F322 Major depressive disorder, single episode, severe without psychotic features: Secondary | ICD-10-CM

## 2017-04-14 DIAGNOSIS — Z8673 Personal history of transient ischemic attack (TIA), and cerebral infarction without residual deficits: Secondary | ICD-10-CM | POA: Diagnosis not present

## 2017-04-14 DIAGNOSIS — F419 Anxiety disorder, unspecified: Secondary | ICD-10-CM | POA: Diagnosis present

## 2017-04-14 DIAGNOSIS — Z23 Encounter for immunization: Secondary | ICD-10-CM

## 2017-04-14 DIAGNOSIS — R450 Nervousness: Secondary | ICD-10-CM | POA: Diagnosis not present

## 2017-04-14 DIAGNOSIS — I361 Nonrheumatic tricuspid (valve) insufficiency: Secondary | ICD-10-CM

## 2017-04-14 DIAGNOSIS — Z8249 Family history of ischemic heart disease and other diseases of the circulatory system: Secondary | ICD-10-CM

## 2017-04-14 DIAGNOSIS — Z818 Family history of other mental and behavioral disorders: Secondary | ICD-10-CM | POA: Diagnosis not present

## 2017-04-14 DIAGNOSIS — Z888 Allergy status to other drugs, medicaments and biological substances status: Secondary | ICD-10-CM

## 2017-04-14 DIAGNOSIS — R7303 Prediabetes: Secondary | ICD-10-CM | POA: Diagnosis present

## 2017-04-14 DIAGNOSIS — T1491XA Suicide attempt, initial encounter: Secondary | ICD-10-CM | POA: Diagnosis not present

## 2017-04-14 DIAGNOSIS — T450X2A Poisoning by antiallergic and antiemetic drugs, intentional self-harm, initial encounter: Secondary | ICD-10-CM | POA: Diagnosis not present

## 2017-04-14 DIAGNOSIS — F329 Major depressive disorder, single episode, unspecified: Secondary | ICD-10-CM

## 2017-04-14 DIAGNOSIS — E785 Hyperlipidemia, unspecified: Secondary | ICD-10-CM | POA: Diagnosis present

## 2017-04-14 DIAGNOSIS — I10 Essential (primary) hypertension: Secondary | ICD-10-CM | POA: Diagnosis present

## 2017-04-14 DIAGNOSIS — I671 Cerebral aneurysm, nonruptured: Secondary | ICD-10-CM

## 2017-04-14 DIAGNOSIS — G459 Transient cerebral ischemic attack, unspecified: Secondary | ICD-10-CM | POA: Diagnosis not present

## 2017-04-14 DIAGNOSIS — Z79899 Other long term (current) drug therapy: Secondary | ICD-10-CM | POA: Diagnosis not present

## 2017-04-14 DIAGNOSIS — Z7982 Long term (current) use of aspirin: Secondary | ICD-10-CM | POA: Diagnosis not present

## 2017-04-14 DIAGNOSIS — Z7984 Long term (current) use of oral hypoglycemic drugs: Secondary | ICD-10-CM

## 2017-04-14 DIAGNOSIS — G4700 Insomnia, unspecified: Secondary | ICD-10-CM | POA: Diagnosis present

## 2017-04-14 DIAGNOSIS — R200 Anesthesia of skin: Secondary | ICD-10-CM

## 2017-04-14 LAB — CBC
HCT: 38.2 % — ABNORMAL LOW (ref 39.0–52.0)
Hemoglobin: 12.5 g/dL — ABNORMAL LOW (ref 13.0–17.0)
MCH: 28.4 pg (ref 26.0–34.0)
MCHC: 32.7 g/dL (ref 30.0–36.0)
MCV: 86.8 fL (ref 78.0–100.0)
PLATELETS: 305 10*3/uL (ref 150–400)
RBC: 4.4 MIL/uL (ref 4.22–5.81)
RDW: 14.1 % (ref 11.5–15.5)
WBC: 9.2 10*3/uL (ref 4.0–10.5)

## 2017-04-14 LAB — COMPREHENSIVE METABOLIC PANEL
ALT: 16 U/L — AB (ref 17–63)
ANION GAP: 8 (ref 5–15)
AST: 15 U/L (ref 15–41)
Albumin: 3 g/dL — ABNORMAL LOW (ref 3.5–5.0)
Alkaline Phosphatase: 80 U/L (ref 38–126)
BUN: 12 mg/dL (ref 6–20)
CHLORIDE: 104 mmol/L (ref 101–111)
CO2: 26 mmol/L (ref 22–32)
Calcium: 8.2 mg/dL — ABNORMAL LOW (ref 8.9–10.3)
Creatinine, Ser: 0.82 mg/dL (ref 0.61–1.24)
GFR calc non Af Amer: >60 mL/min (ref >60)
Glucose, Bld: 68 mg/dL (ref 65–99)
POTASSIUM: 3.1 mmol/L — AB (ref 3.5–5.1)
SODIUM: 138 mmol/L (ref 135–145)
Total Bilirubin: 0.7 mg/dL (ref 0.3–1.2)
Total Protein: 5.8 g/dL — ABNORMAL LOW (ref 6.5–8.1)

## 2017-04-14 LAB — HEMOGLOBIN A1C
Hgb A1c MFr Bld: 6.2 % — ABNORMAL HIGH (ref 4.8–5.6)
Mean Plasma Glucose: 131.24 mg/dL

## 2017-04-14 LAB — LIPID PANEL
CHOL/HDL RATIO: 4.3 ratio
CHOLESTEROL: 124 mg/dL (ref 0–200)
HDL: 29 mg/dL — ABNORMAL LOW (ref >40)
LDL Cholesterol: 74 mg/dL (ref 0–99)
Triglycerides: 107 mg/dL (ref <150)
VLDL: 21 mg/dL (ref 0–40)

## 2017-04-14 LAB — ECHOCARDIOGRAM COMPLETE
HEIGHTINCHES: 68 in
WEIGHTICAEL: 4352 [oz_av]

## 2017-04-14 MED ORDER — DULOXETINE HCL 20 MG PO CPEP
40.0000 mg | ORAL_CAPSULE | Freq: Every day | ORAL | Status: DC
  Administered 2017-04-15 – 2017-04-16 (×2): 40 mg via ORAL
  Filled 2017-04-14 (×5): qty 2

## 2017-04-14 MED ORDER — TRAZODONE HCL 50 MG PO TABS: 50.0000 mg | ORAL_TABLET | Freq: Every evening | ORAL | 0 refills | Status: DC | PRN

## 2017-04-14 MED ORDER — ATORVASTATIN CALCIUM 40 MG PO TABS
80.0000 mg | ORAL_TABLET | Freq: Every day | ORAL | Status: DC
  Administered 2017-04-14 – 2017-04-15 (×2): 80 mg via ORAL
  Filled 2017-04-14 (×3): qty 2
  Filled 2017-04-14 (×2): qty 1
  Filled 2017-04-14: qty 2

## 2017-04-14 MED ORDER — ASPIRIN EC 81 MG PO TBEC
81.0000 mg | DELAYED_RELEASE_TABLET | Freq: Every day | ORAL | Status: DC
  Administered 2017-04-15 – 2017-04-16 (×2): 81 mg via ORAL
  Filled 2017-04-14 (×5): qty 1

## 2017-04-14 MED ORDER — LISINOPRIL 20 MG PO TABS
40.0000 mg | ORAL_TABLET | Freq: Every day | ORAL | Status: DC
  Administered 2017-04-15 – 2017-04-16 (×2): 40 mg via ORAL
  Filled 2017-04-14: qty 2
  Filled 2017-04-14: qty 1
  Filled 2017-04-14: qty 2
  Filled 2017-04-14: qty 1
  Filled 2017-04-14: qty 2

## 2017-04-14 MED ORDER — LORAZEPAM 2 MG/ML IJ SOLN
1.0000 mg | Freq: Once | INTRAMUSCULAR | Status: AC
  Administered 2017-04-14: 1 mg via INTRAVENOUS
  Filled 2017-04-14: qty 1

## 2017-04-14 MED ORDER — AMLODIPINE BESYLATE 10 MG PO TABS
10.0000 mg | ORAL_TABLET | Freq: Every day | ORAL | Status: DC
  Administered 2017-04-15 – 2017-04-16 (×2): 10 mg via ORAL
  Filled 2017-04-14 (×4): qty 1

## 2017-04-14 MED ORDER — INFLUENZA VAC SPLIT QUAD 0.5 ML IM SUSY
0.5000 mL | PREFILLED_SYRINGE | INTRAMUSCULAR | Status: AC
  Administered 2017-04-15: 0.5 mL via INTRAMUSCULAR
  Filled 2017-04-14: qty 0.5

## 2017-04-14 MED ORDER — ATORVASTATIN CALCIUM 80 MG PO TABS: 80.0000 mg | ORAL_TABLET | Freq: Every day | ORAL | 0 refills | Status: DC

## 2017-04-14 MED ORDER — POTASSIUM CHLORIDE CRYS ER 10 MEQ PO TBCR
30.0000 meq | EXTENDED_RELEASE_TABLET | Freq: Once | ORAL | Status: AC
  Administered 2017-04-14: 14:00:00 30 meq via ORAL
  Filled 2017-04-14: qty 1

## 2017-04-14 MED ORDER — PANTOPRAZOLE SODIUM 40 MG PO TBEC
40.0000 mg | DELAYED_RELEASE_TABLET | Freq: Every day | ORAL | Status: DC
  Administered 2017-04-15 – 2017-04-16 (×2): 40 mg via ORAL
  Filled 2017-04-14 (×4): qty 1

## 2017-04-14 MED ORDER — ATORVASTATIN CALCIUM 80 MG PO TABS: 80.0000 mg | ORAL_TABLET | Freq: Every day | ORAL | Status: DC

## 2017-04-14 MED ORDER — DULOXETINE HCL 40 MG PO CPEP: 40.0000 mg | ORAL_CAPSULE | Freq: Every day | ORAL | 0 refills | Status: DC

## 2017-04-14 MED ORDER — HYDROXYZINE HCL 25 MG PO TABS: 25.0000 mg | ORAL_TABLET | Freq: Three times a day (TID) | ORAL | 0 refills | Status: DC | PRN

## 2017-04-14 MED ORDER — ACETAMINOPHEN 325 MG PO TABS
650.0000 mg | ORAL_TABLET | Freq: Four times a day (QID) | ORAL | Status: DC | PRN
  Administered 2017-04-15 – 2017-04-16 (×3): 650 mg via ORAL
  Filled 2017-04-14 (×3): qty 2

## 2017-04-14 MED ORDER — CHLORTHALIDONE 25 MG PO TABS
25.0000 mg | ORAL_TABLET | Freq: Every day | ORAL | Status: DC
  Administered 2017-04-15 – 2017-04-16 (×2): 25 mg via ORAL
  Filled 2017-04-14 (×4): qty 1

## 2017-04-14 MED ORDER — PERFLUTREN LIPID MICROSPHERE
1.0000 mL | INTRAVENOUS | Status: AC | PRN
  Administered 2017-04-14: 2 mL via INTRAVENOUS
  Filled 2017-04-14: qty 10

## 2017-04-14 MED ORDER — GLIPIZIDE ER 10 MG PO TB24
10.0000 mg | ORAL_TABLET | Freq: Every day | ORAL | Status: DC
  Administered 2017-04-15 – 2017-04-16 (×2): 10 mg via ORAL
  Filled 2017-04-14 (×4): qty 1

## 2017-04-14 MED ORDER — GABAPENTIN 300 MG PO CAPS
300.0000 mg | ORAL_CAPSULE | Freq: Every day | ORAL | Status: DC
  Administered 2017-04-14 – 2017-04-15 (×2): 300 mg via ORAL
  Filled 2017-04-14 (×5): qty 1

## 2017-04-14 MED ORDER — ASPIRIN 81 MG PO TBEC: 81.0000 mg | DELAYED_RELEASE_TABLET | Freq: Every day | ORAL | 0 refills | Status: DC

## 2017-04-14 MED ORDER — TRAZODONE HCL 50 MG PO TABS
50.0000 mg | ORAL_TABLET | Freq: Every evening | ORAL | Status: DC | PRN
  Administered 2017-04-14 – 2017-04-15 (×2): 50 mg via ORAL
  Filled 2017-04-14 (×2): qty 1

## 2017-04-14 MED ORDER — ALUM & MAG HYDROXIDE-SIMETH 200-200-20 MG/5ML PO SUSP: 30.0000 mL | ORAL | Status: DC | PRN

## 2017-04-14 MED ORDER — HYDROXYZINE HCL 25 MG PO TABS
25.0000 mg | ORAL_TABLET | Freq: Three times a day (TID) | ORAL | Status: DC | PRN
  Administered 2017-04-15: 25 mg via ORAL
  Filled 2017-04-14: qty 1

## 2017-04-14 MED ORDER — MAGNESIUM HYDROXIDE 400 MG/5ML PO SUSP: 30.0000 mL | Freq: Every day | ORAL | Status: DC | PRN

## 2017-04-14 MED ORDER — PNEUMOCOCCAL VAC POLYVALENT 25 MCG/0.5ML IJ INJ
0.5000 mL | INJECTION | INTRAMUSCULAR | Status: AC
  Administered 2017-04-15: 0.5 mL via INTRAMUSCULAR

## 2017-04-14 NOTE — Evaluation (Signed)
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 The patient was last seen normal at 9:30 AM 04/13/2017 at behavioral health where he is being treated for suicidal ideation.    Clinical Impression   Pt reports he was independent with ADL PTA. Currently pt at his functional baseline and is independent with ADL and functional mobility. Pt requesting Vestibular Evaluation for vertigo symptoms that occur intermittently; feel outpatient would be the best venue to address this concern. Pt planning to d/c home alone. No further acute OT needs identified; signing off at this time. Please re-consult if needs change. Thank you for this referral.    Follow Up Recommendations  Other (comment)(Outpatient Vestibular Evaluation)    Equipment Recommendations  None recommended by OT    Recommendations for Other Services       Precautions / Restrictions Precautions Precautions: Other (comment)(suicide precautions) Restrictions Weight Bearing Restrictions: No      Mobility Bed Mobility Overal bed mobility: Independent                Transfers Overall transfer level: Independent Equipment used: None                  Balance Overall balance assessment: No apparent balance deficits (not formally assessed)                                      ADL either performed or assessed with clinical judgement   ADL Overall ADL's : Modified independent                                             Vision Baseline Vision/History: Wears glasses Patient Visual Report: No change from  baseline Additional Comments: LLQ field cut from prior CVA; pt aware of compensatory strategies, no changes from baseline     Perception     Praxis      Pertinent Vitals/Pain Pain Assessment: No/denies pain     Hand Dominance Right   Extremity/Trunk Assessment Upper Extremity Assessment Upper Extremity Assessment: Overall WFL for tasks assessed   Lower Extremity Assessment Lower Extremity Assessment: Defer to PT evaluation   Cervical / Trunk Assessment Cervical / Trunk Assessment: Normal   Communication Communication Communication: HOH   Cognition Arousal/Alertness: Awake/alert Behavior During Therapy: Flat affect Overall Cognitive Status: Within Functional Limits for tasks assessed                                     General Comments       Exercises     Shoulder Instructions      Home Living Family/patient expects to be discharged to:: Private residence Living Arrangements: Alone Available Help at Discharge: Family;Available PRN/intermittently Type of Home: Apartment Home Access: Stairs to enter Entrance Stairs-Number of Steps: 3 + 14 Entrance Stairs-Rails: Left;Right;Can reach both Home Layout: One level     Bathroom Shower/Tub: Tub/shower unit;Curtain   Bathroom  Toilet: Standard Bathroom Accessibility: Yes   Home Equipment: None   Additional Comments: dtr and sister live out of town; 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      Prior Functioning/Environment Level of Independence: Independent        Comments: No longer drives.  Pt reports he wall walks and furniture walks.          OT Problem List:        OT Treatment/Interventions:      OT Goals(Current goals can be found in the care plan section) Acute Rehab OT Goals Patient Stated Goal: return home OT Goal Formulation: All assessment and education complete, DC therapy  OT Frequency:     Barriers to D/C:             Co-evaluation              AM-PAC PT "6 Clicks" Daily Activity     Outcome Measure Help from another person eating meals?: None Help from another person taking care of personal grooming?: None Help from another person toileting, which includes using toliet, bedpan, or urinal?: None Help from another person bathing (including washing, rinsing, drying)?: None Help from another person to put on and taking off regular upper body clothing?: None Help from another person to put on and taking off regular lower body clothing?: None 6 Click Score: 24   End of Session    Activity Tolerance: Patient tolerated treatment well Patient left: in chair;with call bell/phone within reach;with nursing/sitter in room  OT Visit Diagnosis: Hemiplegia and hemiparesis Hemiplegia - Right/Left: Right Hemiplegia - dominant/non-dominant: Dominant                Time: 2774-1287 OT Time Calculation (min): 12 min Charges:  OT General Charges $OT Visit: 1 Visit OT Evaluation $OT Eval Low Complexity: 1 Low G-Codes: OT G-codes **NOT FOR INPATIENT CLASS** Functional Assessment Tool Used: AM-PAC 6 Clicks Daily Activity Functional Limitation: Self care Self Care Current Status (O6767): 0 percent impaired, limited or restricted Self Care Goal Status (M0947): 0 percent impaired, limited or restricted Self Care Discharge Status (S9628): 0 percent impaired, limited or restricted   Mel Almond A. Ulice Brilliant, M.S., OTR/L Pager: [place] 04/14/2017, 11:49 AM

## 2017-04-14 NOTE — Discharge Instructions (Signed)
It was a pleasure to take care of you [name]! -Please follow up with neurology at [facility] -please take aspirin 81mg  daily -please follow up with primary care doctor to adjust blood pressure medication -please follow up with behavioral health   Thank you

## 2017-04-14 NOTE — Discharge Summary (Signed)
Name: [name] MRN: [id number] DOB: [birth year] 61 y.o. PCP: Allen Harvey, NP  Date of Admission: 04/13/2017  1:49 PM Date of Discharge: 04/15/2017 Attending Physician: Dr. Lalla Brothers  Discharge Diagnosis: Active Problems:   TIA (transient ischemic attack)   Discharge Medications: Allergies as of 04/14/2017      Reactions   Metformin Diarrhea      Medication List    STOP taking these medications   clopidogrel 75 MG tablet Commonly known as:  PLAVIX   FLUoxetine 20 MG capsule Commonly known as:  PROZAC   indapamide 1.25 MG tablet Commonly known as:  LOZOL   meclizine 32 MG tablet Commonly known as:  ANTIVERT   metoprolol tartrate 50 MG tablet Commonly known as:  LOPRESSOR     TAKE these medications   amLODipine 10 MG tablet Commonly known as:  NORVASC Take 1 tablet (10 mg total) by mouth daily.   aspirin 81 MG EC tablet Take 1 tablet (81 mg total) daily by mouth.   atorvastatin 80 MG tablet Commonly known as:  LIPITOR Take 1 tablet (80 mg total) daily at 6 PM by mouth. What changed:    medication strength  how much to take   chlorthalidone 25 MG tablet Commonly known as:  HYGROTON Take 25 mg daily by mouth.   DULoxetine HCl 40 MG Cpep Take 40 mg daily by mouth.   gabapentin 300 MG capsule Commonly known as:  NEURONTIN Take 300 mg by mouth at bedtime.   glipiZIDE 10 MG 24 hr tablet Commonly known as:  GLUCOTROL XL Take 10 mg daily with breakfast by mouth.   hydrOXYzine 25 MG tablet Commonly known as:  ATARAX/VISTARIL Take 1 tablet (25 mg total) 3 (three) times daily as needed by mouth for anxiety.   lisinopril 40 MG tablet Commonly known as:  PRINIVIL,ZESTRIL Take 40 mg by mouth daily.   pantoprazole 40 MG tablet Commonly known as:  PROTONIX Take 1 tablet (40 mg total) by mouth daily.   traZODone 50 MG tablet Commonly known as:  DESYREL Take 1 tablet (50 mg total) at bedtime as needed by mouth for sleep.        Disposition and follow-up:   AllenAllen Hoover was discharged from [facility] in good condition.  At the hospital follow up visit please address:  1.  TIA-make sure no neurological changes  2.  Labs / imaging needed at time of follow-up: none  3.  Pending labs/ test needing follow-up: none  Follow-up Appointments: Follow-up Information    Allen Harvey, NP. Schedule an appointment as soon as possible for a visit in 1 week(s).   Specialty:  Nurse Practitioner Contact information: [place] [address] [phone]        [facility] MRI Follow up.   Specialty:  Radiology Contact information: [address] [id number] [place] [facility] Follow up.   Specialty:  Cardiology Contact information: [address] [id number] [place] [facility] Course by problem list:   Transient Ischemic Attack The patient presents with mild upper extremity numbness and weakness that lasted approximately an hour duration. CT scan showed some lacunar infarcts and MRA showed ischemic changes of the basal ganglia and corona radiata. The patient's transient neurological changes were thought to be due to  a TIA rather than any change in aneurysm. However, Dr. Guerry Hoover at [facility] was contacted to note whether there were any changes in aneurysm size. Dr. Guerry Hoover read the most recent scan and found that the aneurysm had decreased in size. The patient was informed about the decrease in aneurysm size. He was recommended to use aspirin 81mg .   Major Depressive Disorder The patient was admitted at [facility] for attempted suicide. Psychiatry examined the patient at [facility] and stated that the patient was stable for discharge prior to being transferred to [facility] At [facility] he was continued on cymbalta,  neurontin, trazodone, and vistaril.   Discharge Vitals:   BP (!) 142/73 (BP Location: Left Arm)   Pulse 93   Temp 98.2 F (36.8 C) (Oral)   Resp 18   Ht 5\' 8"  (1.727 m)   Wt 272 lb (123.4 kg)   SpO2 94%   BMI 41.36 kg/m   Pertinent Labs, Studies, and Procedures:  MRA Head and MRI brain (04/14/17): no acute intracranial abnormality, stable remote ischemic changes of the basal ganglia and corona radiata. No significant proximal stenosis, aneurysm, significant interval change within the circle of willis. Motion degraded exam.  CT head (04/13/17): no acute intracranial abnormality, stable remote lacunar infarcts of the basal ganglia bilaterally, stable white matter disease, aspects is 10/10  Lipid Panel     Component Value Date/Time   CHOL 124 04/14/2017 0328   TRIG 107 04/14/2017 0328   HDL 29 (L) 04/14/2017 0328   CHOLHDL 4.3 04/14/2017 0328   VLDL 21 04/14/2017 0328   LDLCALC 74 04/14/2017 0328   HgA1C=6.2  Troponin=0.01  Discharge Instructions: Discharge Instructions    Diet - low sodium heart healthy   Complete by:  As directed    Increase activity slowly   Complete by:  As directed       Signed: Lars Mage, MD 04/15/2017, 10:46 AM   Pager: [phone]

## 2017-04-14 NOTE — Progress Notes (Signed)
D: Pt was in the hallway upon initial approach.  Pt presents with depressed affect and mood; brightens with interaction.  He reports his goal is "leaving tomorrow."  He reports he feels safe to discharge and that he would have discharged today "but I got sick."  Pt denies SI/HI, denies hallucinations, denies pain.  Pt has been visible in milieu interacting with peers and staff appropriately.  Pt attended evening group.    A: Introduced self to pt.  Actively listened to pt and offered support and encouragement. Medications administered per order.  PRN medication administered for sleep.  Q15 minute safety checks maintained.  R: Pt is safe on the unit.  Pt is compliant with medications.  Pt verbally contracts for safety and reports he will inform staff of needs and concerns.  Will continue to monitor and assess.

## 2017-04-14 NOTE — Progress Notes (Addendum)
Subjective: This bed this morning with his sitter at bedside.  He stated that he was doing well and denied any new weakness, sensory changes.  He states that he has a right-sided frontal headache that started last night 04/13/2017.  He states that the headache has continuously been present since last night.  Objective:  Vital signs in last 24 hours: Vitals:   04/13/17 2131 04/14/17 0021 04/14/17 0220 04/14/17 0553  BP: 110/67 116/61 122/66 121/72  Pulse: 73 77 71 66  Resp: 18 18 18 18   Temp: 97.9 F (36.6 C) 98 F (36.7 C) 97.9 F (36.6 C) 98.2 F (36.8 C)  TempSrc: Oral Oral Oral Oral  SpO2: 98% 99% 99% 97%  Weight:      Height:       Physical Exam  Constitutional: He is oriented to person, place, and time. He appears well-developed and well-nourished. No distress.  HENT:  Head: Normocephalic and atraumatic.  Eyes: Conjunctivae are normal.  Cardiovascular: Normal rate, regular rhythm and normal heart sounds.  Pulmonary/Chest: Effort normal and breath sounds normal. No stridor. No respiratory distress.  Abdominal: Soft. Bowel sounds are normal. He exhibits no distension. There is no tenderness.  Neurological: He is alert and oriented to person, place, and time. No cranial nerve deficit. Coordination normal.  The patient's cranial nerves II through XII are intact however the patient is hard of hearing in his left ear.  Skin: He is not diaphoretic.  Psychiatric: He has a normal mood and affect. His behavior is normal. Judgment and thought content normal.    Assessment/Plan:  Transient ischemic attack The patient with a history of pipeline stent to the right ACA and right MCA aneurysm presented with right upper extremity and right infra-axillary numbness and heaviness accompanied with right facial tingling that lasted 1 hour in duration 04/13/2017.  Code stroke was called, but the patient was not given TPA.   Chart review it appears that the patient has been having  intermittent episodes of numbness on his, ear, scalp and has been having heaviness in his right arm and shoulder since September 2018.  The patient also reported having stabbing headache in his temporal region at that time.  We would like to rule out serious neurological issues such as possible aneurysm enlargement or hypoglycemia causing stroke recrudescence.  However, the patient is more likely to have had a transient ischemic attack due to the short episode of neurological changes.  It is also possible that the neurological changes observed yesterday versus somatic manifestations of his psychological changes.  Will complete neurological workup to determine.  -Spoke to [facility] neurology about whether there has been any changes in residual pericallosal aneurysm.  Dr. Guerry Bruin will call Dr. Erlinda Hong to discuss neurological changes. -HgbA1C=6.2 -Lipid panel: T cholesterol = 124, triglycerides = 107, HDL = 29, LDL = 74.  Increase statin from 40mg  to 80 mg per neurology recommendation to maintain  LDL goal <70. -MRI, MRA without contrast read pending -Transthoracic echo pending -Vascular ultrasound carotid pending -Continue cardiac monitoring -Neurochecks q 2hrs -PT saw patient today 04/14/2017 and did not recommend any follow-up -OT pending -Bedside swallow passed, heart healthy carb modified diet  Hypokalemia The patient's potassium this morning 04/14/2017 is 3.1 -Repleted potassium 30 mEq K-dur  Major depressive disorder The patient was admitted at [facility] on Sunday, 04/08/2017 post attempted suicide. Patient was seen by psychiatry on 04/13/17 they felt that he is stable for discharge home. He has been discharged by [facility] prior  to being transported to [facility].   -Continue Cymbalta 40 mg daily -Continue Neurontin 300 mg nightly -Continue trazodone 50 mg nightly as needed for insomnia -Continue Vistaril 25 mg every 8 hours as needed for anxiety -Discontinued 1:1  Hypertension The  patient's blood pressure since admission has ranged 99-132/55-66.  No permissive hypertension is needed.  -hold home lisinopril 40 mg daily, metoprolol 50 mg twice daily, amlodipine 10 mg daily as the patient continues to be normotensive.  Dispo: Anticipated discharge in approximately 0-1 days.   Lars Mage, MD Internal Medicine PGY1 Pager:[phone] 04/14/2017, 10:45 AM

## 2017-04-14 NOTE — Progress Notes (Signed)
Patient discharged. IV removed, Telemetry removed, discharge instructions given a long with where follow-up and prescriptions are. Patient verbalized understanding. All of patient's belongings are at [facility]. Security agreed to transport patient to [facility] to retrieve belongings.

## 2017-04-14 NOTE — Progress Notes (Signed)
  Echocardiogram 2D Echocardiogram has been performed.  Matilde Bash 04/14/2017, 1:18 PM

## 2017-04-14 NOTE — Evaluation (Signed)
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 The patient was last seen normal at 9:30 AM 04/13/2017 at behavioral health where he is being treated for suicidal ideation.   Clinical Impression  Orders received for PT evaluation. Patient mobilizing well, no acute needs, will sign off.     Follow Up Recommendations No PT follow up    Equipment Recommendations  None recommended by PT    Recommendations for Other Services       Precautions / Restrictions Precautions Precautions: Other (comment)(suicide precautions)      Mobility  Bed Mobility Overal bed mobility: Independent                Transfers Overall transfer level: Independent                  Ambulation/Gait Ambulation/Gait assistance: Independent Ambulation Distance (Feet): 310 Feet Assistive device: None Gait Pattern/deviations: Wide base of support   Gait velocity interpretation: at or above normal speed for age/gender General Gait Details: steady with ambulation and duel task performance  Stairs            Wheelchair Mobility    Modified Rankin (Stroke Patients Only) Modified Rankin (Stroke Patients Only) Pre-Morbid Rankin Score: No significant disability Modified Rankin: No significant disability     Balance Overall balance assessment: Independent                           High level balance activites: Side stepping;Backward walking;Direction changes;Turns;Sudden stops;Head turns High Level Balance Comments: no overt LOB             Pertinent Vitals/Pain Pain Assessment: No/denies pain    Home Living  Family/patient expects to be discharged to:: Private residence Living Arrangements: Alone Available Help at Discharge: Family;Available PRN/intermittently Type of Home: Apartment Home Access: Stairs to enter Entrance Stairs-Rails: Left;Right;Can reach both Entrance Stairs-Number of Steps: 3 + 14 Home Layout: One level Home Equipment: None Additional Comments: dtr and sister live out of town; 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  Pt reports he wall walks and furniture walks.       Hand Dominance   Dominant Hand: Right    Extremity/Trunk Assessment   Upper Extremity Assessment Upper Extremity Assessment: Overall WFL for tasks assessed    Lower Extremity Assessment Lower Extremity Assessment: Overall WFL for tasks assessed       Communication   Communication: HOH  Cognition Arousal/Alertness: Awake/alert Behavior During Therapy: WFL for tasks assessed/performed Overall Cognitive Status: Within Functional Limits for tasks assessed                                        General Comments      Exercises     Assessment/Plan    PT Assessment Patent does not need any further PT services  PT Problem List  PT Treatment Interventions      PT Goals (Current goals can be found in the Care Plan section)  Acute Rehab PT Goals PT Goal Formulation: All assessment and education complete, DC therapy    Frequency     Barriers to discharge        Co-evaluation               AM-PAC PT "6 Clicks" Daily Activity  Outcome Measure Difficulty turning over in bed (including adjusting bedclothes, sheets and blankets)?: None Difficulty moving from lying on back to sitting on the side of the bed? : None Difficulty sitting down on and standing up from a chair with arms (e.g., wheelchair, bedside commode, etc,.)?:  None Help needed moving to and from a bed to chair (including a wheelchair)?: None Help needed walking in hospital room?: None Help needed climbing 3-5 steps with a railing? : A Little 6 Click Score: 23    End of Session Equipment Utilized During Treatment: Gait belt Activity Tolerance: Patient tolerated treatment well Patient left: in bed;with call bell/phone within reach;with nursing/sitter in room Nurse Communication: Mobility status PT Visit Diagnosis: Other symptoms and signs involving the nervous system (R29.898)    Time: 7673-4193 PT Time Calculation (min) (ACUTE ONLY): 17 min   Charges:   PT Evaluation $PT Eval Low Complexity: 1 Low     PT G Codes:   PT G-Codes **NOT FOR INPATIENT CLASS** Functional Assessment Tool Used: Clinical judgement Functional Limitation: Mobility: Walking and moving around Mobility: Walking and Moving Around Current Status (X9024): At least 1 percent but less than 20 percent impaired, limited or restricted Mobility: Walking and Moving Around Goal Status [phone]): At least 1 percent but less than 20 percent impaired, limited or restricted Mobility: Walking and Moving Around Discharge Status [phone]): At least 1 percent but less than 20 percent impaired, limited or restricted    Alben Deeds, PT DPT  Board Certified Neurologic Specialist [place] 04/14/2017, 9:15 AM

## 2017-04-14 NOTE — Progress Notes (Signed)
Psychoeducational Group Note  Date:  04/14/2017 Time:  2356  Group Topic/Focus:  Wrap-Up Group:   The focus of this group is to help patients review their daily goal of treatment and discuss progress on daily workbooks.  Participation Level: Did Not Attend  Participation Quality:  Not Applicable  Affect:  Not Applicable  Cognitive:  Not Applicable  Insight:  Not Applicable  Engagement in Group: Not Applicable  Additional Comments:  The patient did not attend group this evening.   Archie Balboa S 04/14/2017, 11:56 PM

## 2017-04-14 NOTE — Progress Notes (Signed)
Pt came to [facility] voluntary and pt reports an intentional overdose on sleeping pills last week. Pt was recently at [facility] and discharged to [facility] with stroke symptoms. He then came back to [facility]. Pt has a medical hx of an aneurism, stroke and HTN. Pt says that his children and grandchildren are his support. Pt says that he has crying spells and depression with no psychosis or substance abuse.

## 2017-04-14 NOTE — Progress Notes (Signed)
SLP Cancellation Note  Patient Details Name: [name] MRN: [id number] DOB: Oct 07, 1955   Cancelled treatment:       Reason Eval/Treat Not Completed: SLP screened, no needs identified, will sign off. Imaging negative for acute findings; Pt reports at his cognitive linguistic baseline.  Deneise Lever, [place], CCC-SLP Speech-Language Pathologist [phone]   Aliene Altes 04/14/2017, 3:13 PM

## 2017-04-14 NOTE — Progress Notes (Signed)
STROKE TEAM PROGRESS NOTE   HISTORY OF PRESENT ILLNESS (per record) [name] is a 61 y.o. 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  According to the patient, he reports that he was told to stop his antiplatelet therapy at that time.  This morning, he is currently being treated at behavioral health for suicidal ideation, he developed acute onset right-sided numbness and weakness that is now improving.   LKW: 9:30 AM tpa given?: no, mild, improving symptoms   SUBJECTIVE (INTERVAL HISTORY) His sitter is at the bedside.  Pt lying in bed without distress. 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 He has since lost his job and insurance. He has 3 family members died of aneurysm rupture and he is afraid that he is the next one.   I told him that I talked with Dr. Guerry Bruin who is his neurologist in [facility] and he looked at his recent cerebral imaging and his aneurysm reoccurence is minimal to non-existence. I re-assured him. According to my discussion with Dr. Guerry Bruin that he can be discharged from inpatient service and follow up as outpt with neurology, neurosurgery and radiology in [facility]. Pt stated that he has appointment with radiology at [facility] in 05/2017 to repeat cerebral angiogram. I asked him to keep that appointment.    OBJECTIVE Temp:  [97.4 F (36.3 C)-98.9 F (37.2 C)] 98.2 F (36.8 C) (11/17 0553) Pulse Rate:  [55-93] 66 (11/17 0553) Cardiac Rhythm: Normal sinus rhythm (11/17 0817) Resp:  [11-24] 18 (11/17 0553) BP: (99-132)/(50-100) 121/72 (11/17 0553) SpO2:  [91 %-100 %] 97 % (11/17 0553) Weight:  [265 lb 2 oz  (120.3 kg)-272 lb (123.4 kg)] 272 lb (123.4 kg) (11/16 1509)  CBC:  Recent Labs  Lab 04/13/17 1101 04/14/17 0328  WBC 8.6 9.2  NEUTROABS 5.9  --   HGB 12.8* 12.5*  HCT 38.9* 38.2*  MCV 87.4 86.8  PLT 321 878    Basic Metabolic Panel:  Recent Labs  Lab 04/13/17 1050 04/13/17 1057 04/14/17 0328  NA 138 138 138  K 3.4* 3.5 3.1*  CL 105 104 104  CO2 27  --  26  GLUCOSE 165* 170* 68  BUN 16 19 12   CREATININE 1.15 1.10 0.82  CALCIUM 8.5*  --  8.2*    Lipid Panel:     Component Value Date/Time   CHOL 124 04/14/2017 0328   TRIG 107 04/14/2017 0328   HDL 29 (L) 04/14/2017 0328   CHOLHDL 4.3 04/14/2017 0328   VLDL 21 04/14/2017 0328   LDLCALC 74 04/14/2017 0328   HgbA1c:  Lab Results  Component Value Date   HGBA1C 6.2 (H) 04/14/2017   Urine Drug Screen:     Component Value Date/Time   LABOPIA NONE DETECTED 04/08/2017 1718   COCAINSCRNUR NONE DETECTED 04/08/2017 1718   COCAINSCRNUR NEG 06/07/2006 0039   LABBENZ POSITIVE (A) 04/08/2017 1718   LABBENZ NEG 06/07/2006 0039   AMPHETMU NONE DETECTED 04/08/2017 1718   THCU NONE DETECTED 04/08/2017 1718   LABBARB NONE DETECTED 04/08/2017 1718    Alcohol Level     Component Value Date/Time   ETH <10  04/08/2017 1718    IMAGING I have personally reviewed the radiological images below and agree with the radiology interpretations.  Ct Head Code Stroke Wo Contrast 04/13/2017 IMPRESSION:  1. No acute intracranial abnormality.  2. Stable remote lacunar infarcts of the basal ganglia bilaterally.  3. Stable white matter disease.  4. ASPECTS is 10/10    MRI / MRA Head / Brain Wo Contrast 04/14/2017 IMPRESSION: 1. No acute intracranial abnormality. 2. Stable remote ischemic changes of the basal ganglia and corona radiata bilaterally. 3. MRA is severely degraded by patient motion. The distal vessels cannot be evaluated. This includes the area of the previous stent. 4. No significant proximal stenosis, aneurysm, or  significant interval change within the circle of Willis.   Transthoracic Echocardiogram - Left ventricle: The cavity size was normal. There was mild   concentric hypertrophy. Systolic function was vigorous. The   estimated ejection fraction was in the range of 65% to 70%. Wall   motion was normal; there were no regional wall motion   abnormalities. Doppler parameters are consistent with abnormal   left ventricular relaxation (grade 1 diastolic dysfunction). - Aortic valve: There was no regurgitation. - Aortic root: The aortic root was normal in size. - Mitral valve: There was no regurgitation. - Left atrium: The atrium was normal in size. - Right ventricle: Systolic function was normal. - Right atrium: The atrium was normal in size. - Tricuspid valve: There was mild regurgitation. - Pulmonary arteries: Systolic pressure was mildly increased. PA   peak pressure: 37 mm Hg (S). - Inferior vena cava: The vessel was normal in size. - Pericardium, extracardiac: There was no pericardial effusion. Impressions: - No cardiac source of emboli was indentified.  Bilateral Carotid Dopplers  Bilateral: 1-39% ICA stenosis. Vertebral artery flow is antegrade.   PHYSICAL EXAM Vitals:   04/13/17 2131 04/14/17 0021 04/14/17 0220 04/14/17 0553  BP: 110/67 116/61 122/66 121/72  Pulse: 73 77 71 66  Resp: 18 18 18 18   Temp: 97.9 F (36.6 C) 98 F (36.7 C) 97.9 F (36.6 C) 98.2 F (36.8 C)  TempSrc: Oral Oral Oral Oral  SpO2: 98% 99% 99% 97%  Weight:      Height:        Temp:  [98.2 F (36.8 C)-99.1 F (37.3 C)] 99.1 F (37.3 C) (11/17 1900) Pulse Rate:  [89-106] 106 (11/17 1900) Resp:  [16-18] 16 (11/17 1900) BP: (130-155)/(66-81) 155/81 (11/17 1900) SpO2:  [94 %] 94 % (11/17 1459) Weight:  [252 lb (114.3 kg)] 252 lb (114.3 kg) (11/17 1900)  General - obese, well developed, in no apparent distress.  Ophthalmologic - Fundi not visualized due to eye movement.  Cardiovascular - Regular  rate and rhythm.  Mental Status -  Level of arousal and orientation to time, place, and person were intact. Language including expression, naming, repetition, comprehension was assessed and found intact. Fund of Knowledge was assessed and was intact.  Cranial Nerves II - XII - II - Visual field intact OU. III, IV, VI - Extraocular movements intact. V - Facial sensation intact bilaterally. VII - Facial movement intact bilaterally. VIII - Hearing & vestibular intact bilaterally. X - Palate elevates symmetrically. XI - Chin turning & shoulder shrug intact bilaterally. XII - Tongue protrusion intact.  Motor Strength - The patient's strength was normal in all extremities and pronator drift was absent.  Bulk was normal and fasciculations were absent.   Motor Tone - Muscle tone was assessed at the neck and appendages and  was normal.  Reflexes - The patient's reflexes were 1+ in all extremities and he had no pathological reflexes.  Sensory - Light touch, temperature/pinprick were assessed and were symmetrical.    Coordination - The patient had normal movements in the hands and feet with no ataxia or dysmetria.  Tremor was absent.  Gait and Station - deferred   ASSESSMENT/PLAN Mr. EVERT WENRICH is a 61 y.o. 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  He did not receive IV t-PA due to improvement in deficits.  Depression and anxiety related somatization   Resultant  Depressed mood  CT head - No acute intracranial abnormality.  MRI head - chronic b/l lacunar infarcts b/l BG and CR  MRA head - motion degraded but no aneurysm seen  Carotid Doppler - negative  2D Echo - EF 65-70%  LDL -  74  HgbA1c - 6.2  VTE prophylaxis -  - Lovenox Diet heart healthy/carb modified Room service appropriate? Yes; Fluid consistency: Thin  No antithrombotic prior to admission, now on aspirin 81 mg daily. Continue ASA 81mg  on discharge and follow up with [facility] neurology Dr. Guerry Bruin  Patient counseled to be compliant with his antithrombotic medications  Ongoing aggressive stroke risk factor management  Therapy recommendations: No follow-up physical therapy recommended  Disposition:  Pending  Depression   Recent admission to [facility] for suicidal ideation  Related to fear of aneurysm rupture  I have discussed with his neurologist Dr. Guerry Bruin over the phone and reassured him that his aneurysm has been stable and treated and asked him to continue follow up with neurology, neurosurgery, radiology at [facility] as outpt  History of ACA aneurysm s/p pipeline   12/2015 right ACA pipeline treatment  05/2016 CTA h/n right ACA pipeline stent stable, MRI neg, CUS/TTE neg, LDL 94 and loop neg for afib  11/2016 repeat DSA at [facility] minimal remanence of prior aneurysm as per Dr. Guerry Bruin  Hypertension  Stable  Long-term BP goal normotensive  Hyperlipidemia  Home meds:  Lipitor 40 mg daily resumed in hospital  LDL 74, goal < 70  Continue statin at discharge  Other Stroke Risk Factors  Advanced age  Obesity, Body mass index is 41.36 kg/m., recommend weight loss, diet and exercise as appropriate   Hx stroke/TIA  Family hx stroke (mother)  Other Active Problems  Hypokalemia - 3.1  Hospital day # 0  Neurology will sign off. Please call with questions. Pt will follow up with Dr. Guerry Bruin at [facility] as scheduled. Thanks for the consult.  Rosalin Hawking, MD PhD Stroke Neurology 04/15/2017 6:42 AM  To contact Stroke Continuity provider, please refer to [email or web address] After hours, contact General Neurology

## 2017-04-14 NOTE — Progress Notes (Signed)
VASCULAR LAB PRELIMINARY  PRELIMINARY  PRELIMINARY  PRELIMINARY  Carotid duplex completed.    Preliminary report:  1-39% ICA plaquing. Vertebral artery flow is antegrade.   Quanah Majka, RVT 04/14/2017, 1:11 PM

## 2017-04-14 NOTE — Tx Team (Signed)
Initial Treatment Plan 04/14/2017 7:21 PM [name] KGU:[id number]    PATIENT STRESSORS: Health problems   PATIENT STRENGTHS: Average or above average intelligence Communication skills General fund of knowledge Supportive family/friends   PATIENT IDENTIFIED PROBLEMS: si  Depression/tearful                   DISCHARGE CRITERIA:  Ability to meet basic life and health needs Adequate post-discharge living arrangements Improved stabilization in mood, thinking, and/or behavior Medical problems require only outpatient monitoring Motivation to continue treatment in a less acute level of care Need for constant or close observation no longer present Reduction of life-threatening or endangering symptoms to within safe limits Safe-care adequate arrangements made Verbal commitment to aftercare and medication compliance  PRELIMINARY DISCHARGE PLAN: Outpatient therapy Return to previous living arrangement  PATIENT/FAMILY INVOLVEMENT: This treatment plan has been presented to and reviewed with the patient, [name], and/or family member, .  The patient and family have been given the opportunity to ask questions and make suggestions.  [name], RN 04/14/2017, 7:21 PM

## 2017-04-14 NOTE — Plan of Care (Signed)
  Progressing Safety: Periods of time without injury will increase 04/14/2017 2200 - Progressing by Karie Kirks, RN Note Pt has not harmed self or others tonight.  He denies SI/HI and verbally contracts for safety.

## 2017-04-14 NOTE — Progress Notes (Signed)
Spoke to Dr. Erlinda Hong who spoke to his neurologist at [facility] and found out that the patient's aneurysm has decreased significantly in size. Dr. Erlinda Hong stated that the patient was stable for discharge without any further testing necessary. He recommended discharge on aspirin 81mg  and follow up at [facility] for imaging and neurology visit.   Lars Mage, MD Internal Medicine PGY1 Pager:[phone] 04/14/2017, 3:07 PM

## 2017-04-15 DIAGNOSIS — F322 Major depressive disorder, single episode, severe without psychotic features: Principal | ICD-10-CM

## 2017-04-15 DIAGNOSIS — F419 Anxiety disorder, unspecified: Secondary | ICD-10-CM

## 2017-04-15 DIAGNOSIS — R450 Nervousness: Secondary | ICD-10-CM

## 2017-04-15 DIAGNOSIS — T1491XA Suicide attempt, initial encounter: Secondary | ICD-10-CM

## 2017-04-15 DIAGNOSIS — T450X2A Poisoning by antiallergic and antiemetic drugs, intentional self-harm, initial encounter: Secondary | ICD-10-CM

## 2017-04-15 MED ORDER — HYDROXYZINE HCL 25 MG PO TABS
25.0000 mg | ORAL_TABLET | Freq: Every evening | ORAL | Status: DC | PRN
  Administered 2017-04-15: 25 mg via ORAL
  Filled 2017-04-15 (×6): qty 1

## 2017-04-15 NOTE — Progress Notes (Signed)
Pt attended evening wrap up group and stated today was a 8, "had an ok day but glad to be here".

## 2017-04-15 NOTE — Progress Notes (Signed)
Patient ID: LASZLO ELLERBY, male   DOB: 11/12/1955, 61 y.o.   MRN: [id number]  Pt currently presents with a sad affect and cooperative behavior. Pt reports to Probation officer that he feels he is ready to go home tomorrow. Still reporting helplessness and expressing sadness in regards to his aneurysm. Pt reports confusion around his new hypertensive and antidepressant medications. Pt states "I think it's the memory issue from the stroke." Pt denies any current physical complaints. Pt reports good sleep with current medication regimen.   Pt provided with medications per providers orders. Pt's labs and vitals were monitored throughout the night. Pt given a 1:1 about emotional and mental status. Pt supported and encouraged to express concerns and questions. Pt educated on medications, the importance of adherence and medication management follow up post discharge.   Pt's safety ensured with 15 minute and environmental checks. Pt currently denies SI/HI and A/V hallucinations. Pt verbally agrees to seek staff if SI/HI or A/VH occurs and to consult with staff before acting on any harmful thoughts.Verbal understanding expressed about education. Pt reports that his daughters told him "I sounded more like me" when speaking with them on the phone today. Will continue POC.

## 2017-04-15 NOTE — Progress Notes (Signed)
D. Pt calm and cooperative with sense of humor. Pt asked this Writer if he could "shave his legs", then after a few minutes chuckled -as he was just joking and wanted to shave his face, stating, "It has been three days". Pt supervised while shaving. Pt currently denies SI/HI and AVH and rates his depression, hopelessness and anxiety- all 2/10. Pt completed self inventory and attended am group A. Labs and vitals monitored. Pt compliant with medications. Pt supported emotionally and encouraged to express concerns and ask questions.   R. Pt remains safe with 15 minute checks. Will continue POC.

## 2017-04-15 NOTE — BHH Group Notes (Signed)
[facility] LCSW Group Therapy Note  Date/Time:  04/15/2017 10:00-11:00AM  Type of Therapy and Topic:  Group Therapy:  Healthy and Unhealthy Supports  Participation Level:  Active   Description of Group:  Patients in this group were introduced to the idea of adding a variety of healthy supports to address the various needs in their lives. The picture on the front of Sunday's workbook was used to demonstrate why more supports are needed in every patient's life.  Patients identified and described healthy supports versus unhealthy supports in general, then gave examples of each in their own lives.   They discussed what additional healthy supports could be helpful in their recovery and wellness after discharge in order to prevent future hospitalizations.   An emphasis was placed on using counselor, doctor, therapy groups, 12-step groups, and problem-specific support groups to expand supports.  They also worked as a group on developing a specific plan for several patients to deal with unhealthy supports through [place], psychoeducation with loved ones, and even termination of relationships.   Therapeutic Goals:   1)  discuss importance of adding supports to stay well once out of the hospital  2)  compare healthy versus unhealthy supports and identify some examples of each  3)  generate ideas and descriptions of healthy supports that can be added  4)  offer mutual support about how to address unhealthy supports  5)  encourage active participation in and adherence to discharge plan    Summary of Patient Progress:  The patient shared a great deal throughout group, retrieved a note from his room that another patient who has left had written to him, and read to the group.  He was offering to let any patients present take his number and call him any time of day or night in crisis.  He was able to acknowledge that he himself cannot really describe what his mental health issue is or its symptoms, and  therefore it would be impossible for him to help supports in his life understand him and what he needs from them.  Therapeutic Modalities:   Motivational Interviewing Brief Solution-Focused Therapy  Selmer Dominion, LCSW 04/15/2017, 11:00AM

## 2017-04-15 NOTE — BHH Suicide Risk Assessment (Signed)
[facility] Admission Suicide Risk Assessment   Nursing information obtained from:  Patient Demographic factors:  Male, Caucasian, Living alone Current Mental Status:  Suicidal ideation indicated by patient Loss Factors:  NA Historical Factors:  Family history of suicide Risk Reduction Factors:  Positive therapeutic relationship  Total Time spent with patient: 45 minutes Principal Problem:  MDD Diagnosis:   Patient Active Problem List   Diagnosis Date Noted  . Severe major depression ([place]) [F32.2] 04/14/2017  . Major depressive disorder, recurrent ([place]) [F33.9] 04/09/2017  . MDD (major depressive disorder), recurrent severe, without psychosis ([place]) [F33.2] 04/09/2017  . Family history of brain aneurysm [Z82.49]   . Intractable vomiting [R11.10] 10/20/2016  . History of intracranial aneurysm [Z86.79]   . Hypokalemia [E87.6]   . AKI (acute kidney injury) ([place]) [N17.9]   . Prolonged QT interval [R94.31]   . Lactic acidosis [E87.2]   . Dysphagia [R13.10]   . Esophageal ring [K22.2]   . Gastroesophageal reflux disease with esophagitis [K21.0]   . Nausea and vomiting [R11.2] 10/01/2016  . 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 Severe episode of recurrent major depressive disorder, without psychotic features ([place]) [F33.2] 07/20/2016  . CVA (cerebral vascular accident) ([place]) [I63.9] 06/15/2016  . Abdominal pain [R10.9]   . Numbness [R20.0]   . Carpal tunnel syndrome, bilateral [G56.03] 12/16/2015  . Status post stroke [Z86.73] 11/12/2015  . Transient neurologic deficit [R29.818] 10/15/2015  . Cerebrovascular accident, late effects [I69.90] 10/07/2015  . 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 Anxiety state [F41.1]   . 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 Cerebral infarction due to embolism of right middle cerebral artery ([place]) [I63.411] 09/23/2015  . Cerebral infarction due to embolism of cerebral artery ([place]) [I63.40] 09/23/2015  . Gait disturbance, post-stroke [I69.398, R26.9]    . Drooping of mouth [R29.810]   . Cerebrovascular accident (CVA) due to embolism of right anterior cerebral artery ([place]) [I63.421]   . Benign essential HTN [I10]   . Migraine with aura and without status migrainosus, not intractable [G43.109]   . Tachypnea [R06.82]   . Prediabetes [R73.03]   . Acute blood loss anemia [D62]   . Brain aneurysm [I67.1] 09/20/2015  . Type 2 diabetes mellitus ([place]) [E11.9] 09/15/2015  . Leukocytosis [D72.829] 09/15/2015  . Aneurysm, cerebral, nonruptured [I67.1] 09/15/2015  . TIA (transient ischemic attack) [G45.9] 09/14/2015  . Temporary cerebral vascular dysfunction [G93.9] 09/14/2015  . H/O transient cerebral ischemia [Z86.73] 07/29/2015  . At risk for falling [Z91.81] 07/29/2015  . Bilateral hearing loss [H91.93] 10/25/2014  . Abnormal fear [F40.9] 08/08/2012  . Headache, migraine [G43.909] 08/08/2012  . Arthritis, degenerative [M19.90] 08/08/2012  . History of tear of ACL (anterior cruciate ligament) [Z87.828] 03/08/2011  . History of knee problem [Z87.39] 03/08/2011  . Depression [F32.9] 09/07/2010  . Depressive disorder, not elsewhere classified [F32.9] 09/07/2010  . URI (upper respiratory infection) [J06.9] 08/24/2010  . Hearing loss [H91.90] 09/06/2006  . Hyperlipidemia [E78.5] 04/23/2006  . Essential hypertension [I10] 04/23/2006  . Psychophysiological insomnia [F51.04] 04/23/2006    Continued Clinical Symptoms:  Alcohol Use Disorder Identification Test Final Score (AUDIT): 0 The "Alcohol Use Disorders Identification Test", Guidelines for Use in Primary Care, Second Edition.  World Pharmacologist [facility]). Score between 0-7:  no or low risk or alcohol related problems. Score between 8-15:  moderate risk of alcohol related problems. Score between 16-19:  high risk of alcohol related problems. Score 20 or above:  warrants further diagnostic evaluation for alcohol dependence and treatment.   CLINICAL FACTORS:  61 year old male, divorced,  lives alone, history of depression, which started after he had CVA in 2016. Presented for depression, SI. On 11/16 was transferred to ED due to hypoesthesias on arm( history of CVA and brain aneurysm). Was briefly admitted on medical unit, cleared for CVA, and returned yesterday to [facility] for ongoing care. Psychiatrically he is significantly better and at this time expect short length of stay .    Psychiatric Specialty Exam: Physical Exam  ROS  Blood pressure 132/77, pulse 97, temperature 98.2 F (36.8 C), temperature source Oral, resp. rate 20, height 5\' 8"  (1.727 m), weight 114.3 kg (252 lb).Body mass index is 38.32 kg/m.  See admit note MSE    COGNITIVE FEATURES THAT CONTRIBUTE TO RISK:  Closed-mindedness and Loss of executive function    SUICIDE RISK:   Moderate:  Frequent suicidal ideation with limited intensity, and duration, some specificity in terms of plans, no associated intent, good self-control, limited dysphoria/symptomatology, some risk factors present, and identifiable protective factors, including available and accessible social support.  PLAN OF CARE: Patient will be admitted to inpatient psychiatric unit for stabilization and safety. Will provide and encourage milieu participation. Provide medication management and maked adjustments as needed.  Will follow daily.    I certify that inpatient services furnished can reasonably be expected to improve the patient's condition.   Jenne Campus, MD 04/15/2017, 1:50 PM

## 2017-04-15 NOTE — H&P (Signed)
Psychiatric Admission Assessment Adult  Patient Identification: [name] MRN:  [id number] Date of Evaluation:  04/15/2017 Chief Complaint:  " I am feeling better" Principal Diagnosis:  MDD  Diagnosis:   Patient Active Problem List   Diagnosis Date Noted  . Severe major depression ([place]) [F32.2] 04/14/2017  . Major depressive disorder, recurrent ([place]) [F33.9] 04/09/2017  . MDD (major depressive disorder), recurrent severe, without psychosis ([place]) [F33.2] 04/09/2017  . Family history of brain aneurysm [Z82.49]   . Intractable vomiting [R11.10] 10/20/2016  . History of intracranial aneurysm [Z86.79]   . Hypokalemia [E87.6]   . AKI (acute kidney injury) ([place]) [N17.9]   . Prolonged QT interval [R94.31]   . Lactic acidosis [E87.2]   . Dysphagia [R13.10]   . Esophageal ring [K22.2]   . Gastroesophageal reflux disease with esophagitis [K21.0]   . Nausea and vomiting [R11.2] 10/01/2016  . 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 Severe episode of recurrent major depressive disorder, without psychotic features ([place]) [F33.2] 07/20/2016  . CVA (cerebral vascular accident) ([place]) [I63.9] 06/15/2016  . Abdominal pain [R10.9]   . Numbness [R20.0]   . Carpal tunnel syndrome, bilateral [G56.03] 12/16/2015  . Status post stroke [Z86.73] 11/12/2015  . Transient neurologic deficit [R29.818] 10/15/2015  . Cerebrovascular accident, late effects [I69.90] 10/07/2015  . Complicated migraine [I78.676]   . Anxiety state [F41.1]   . 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 Cerebral infarction due to embolism of right middle cerebral artery ([place]) [I63.411] 09/23/2015  . Cerebral infarction due to embolism of cerebral artery ([place]) [I63.40] 09/23/2015  . Gait disturbance, post-stroke [I69.398, R26.9]   . Drooping of mouth [R29.810]   . Cerebrovascular accident (CVA) due to embolism of right anterior cerebral artery ([place]) [I63.421]   . Benign essential HTN [I10]   . Migraine with aura and without  status migrainosus, not intractable [G43.109]   . Tachypnea [R06.82]   . Prediabetes [R73.03]   . Acute blood loss anemia [D62]   . Brain aneurysm [I67.1] 09/20/2015  . Type 2 diabetes mellitus ([place]) [E11.9] 09/15/2015  . Leukocytosis [D72.829] 09/15/2015  . Aneurysm, cerebral, nonruptured [I67.1] 09/15/2015  . TIA (transient ischemic attack) [G45.9] 09/14/2015  . Temporary cerebral vascular dysfunction [G93.9] 09/14/2015  . H/O transient cerebral ischemia [Z86.73] 07/29/2015  . At risk for falling [Z91.81] 07/29/2015  . Bilateral hearing loss [H91.93] 10/25/2014  . Abnormal fear [F40.9] 08/08/2012  . Headache, migraine [G43.909] 08/08/2012  . Arthritis, degenerative [M19.90] 08/08/2012  . History of tear of ACL (anterior cruciate ligament) [Z87.828] 03/08/2011  . History of knee problem [Z87.39] 03/08/2011  . Depression [F32.9] 09/07/2010  . Depressive disorder, not elsewhere classified [F32.9] 09/07/2010  . URI (upper respiratory infection) [J06.9] 08/24/2010  . Hearing loss [H91.90] 09/06/2006  . Hyperlipidemia [E78.5] 04/23/2006  . Essential hypertension [I10] 04/23/2006  . Psychophysiological insomnia [F51.04] 04/23/2006   History of Present Illness: 61 year old male, known to our unit from recent admission ( 11/13-11/16/18). Please refer to full initial psychiatric assessment dated 11/13.  Briefly, he was admitted to [facility] 11/3  due to worsening depression, neuro-vegetative symptoms of depression such as anhedonia,sadness, low energy, and recent suicidal ideations, with recent overdose on unknown " sleeping medication" on 11/3. He was improving gradually insofar as mood, affect and symptoms of depression. On 11/16 patient reported acute numbness on right arm. As he has a history of CVA in 2016 and history of CNS aneurysm, he was transported to ED and was briefly admitted under Neurology Service . He  was worked up with CNS MRI, Echocardiogram, and Carotid US. Work up was negative,  and it was not felt patient had a CVA. He was discharged back to [facility] for further psychiatric management on 11/18, and was readmitted . At this time patient reports feeling better than he had prior to admission- less depressed, feeling more optimistic, hopeful, looking forward to spending time with family and grandson after discharge, and minimizing current significant neuro-vegetative symptoms  Associated Signs/Symptoms: Depression Symptoms: improved, currently mild decreased energy but improved, no anhedonia , feels less sad, and more optimistic  (Hypo) Manic Symptoms:  improved  Anxiety Symptoms:  improving anxiety, reports vague sense of anxiety Psychotic Symptoms:  denies  PTSD Symptoms: Denies  Total Time spent with patient:  45 minutes   Past Psychiatric History: history of depression, which became noticeable to him after he had CVA in 2016. No history of psychosis, no history of mania, no history of PTSD. Attempted suicide in early November by overdosing .   Is the patient at risk to self? Yes.    Has the patient been a risk to self in the past 6 months? No.  Has the patient been a risk to self within the distant past? No.  Is the patient a risk to others? No.  Has the patient been a risk to others in the past 6 months? No.  Has the patient been a risk to others within the distant past? No.   Prior Inpatient Therapy:  as above  Prior Outpatient Therapy:  -  Alcohol Screening: 1. How often do you have a drink containing alcohol?: Never 2. How many drinks containing alcohol do you have on a typical day when you are drinking?: 1 or 2 3. How often do you have six or more drinks on one occasion?: Never AUDIT-C Score: 0 4. How often during the last year have you found that you were not able to stop drinking once you had started?: Never 5. How often during the last year have you failed to do what was normally expected from you becasue of drinking?: Never 6. How often during the last  year have you needed a first drink in the morning to get yourself going after a heavy drinking session?: Never 7. How often during the last year have you had a feeling of guilt of remorse after drinking?: Never 8. How often during the last year have you been unable to remember what happened the night before because you had been drinking?: Never 9. Have you or someone else been injured as a result of your drinking?: No 10. Has a relative or friend or a doctor or another health worker been concerned about your drinking or suggested you cut down?: No Alcohol Use Disorder Identification Test Final Score (AUDIT): 0 Intervention/Follow-up: AUDIT Score <7 follow-up not indicated Substance Abuse History in the last 12 months:  Denies alcohol or drug abuse  Consequences of Substance Abuse: Denies  Previous Psychotropic Medications: Prozac in the past, more recently on Cymbalta trial, which he is tolerating well thus far  Psychological Evaluations:  No  Past Medical History:  Past Medical History:  Diagnosis Date  . Allergy   . Anxiety   . Cataract   . Cerebral hemorrhage ([place])   . Chest pain, atypical    12/2003:  negative cardiolyte  . Depression   . Drug abuse ([place])   . Eczema   . Erectile dysfunction   . History of meniscal tear    bilateral  .  HLD (hyperlipidemia)   . HTN (hypertension)   . Hx of tear of ACL (anterior cruciate ligament)    right  . Insomnia   . Olecranon bursitis of left elbow 10/2009   s/p I&D by Dr Maxie Better, initially assessed by Dr. Nori Riis   . Prediabetes   . Stroke ([place])   . Suicidal behavior 04/13/2017   Pt states taking a bunch of sleeping pills to end life   . TIA (transient ischemic attack)     Past Surgical History:  Procedure Laterality Date  . EMBOLIZATION         (RADIOLOGY WITH ANESTHESIA) N/A 09/20/2015   Performed by Luanne Bras, MD at [facility] OR  . ESOPHAGOGASTRODUODENOSCOPY (EGD) N/A 10/03/2016   Performed by Irene Shipper, MD at [place]  .  INCISE AND DRAIN ABCESS     L elbow due to cellulitis/bursitis  . INNER EAR SURGERY    . IR GENERIC HISTORICAL  12/28/2015   IR ANGIO VERTEBRAL SEL VERTEBRAL UNI L MOD SED 12/28/2015 Luanne Bras, MD [facility]-INTER[facility] RAD  . IR GENERIC HISTORICAL  12/28/2015   IR ANGIO VERTEBRAL SEL SUBCLAVIAN INNOMINATE UNI R MOD SED 12/28/2015 Luanne Bras, MD [facility]INTER[facility] RAD  . IR GENERIC HISTORICAL  12/28/2015   IR ANGIO INTRA EXTRACRAN SEL INTERNAL CAROTID BILAT MOD SED 12/28/2015 Luanne Bras, MD [facility]INTER[facility]  . KNEE ARTHROSCOPY    . Loop Recorder Insertion N/A 12/30/2015   Performed by Thompson Grayer, MD at [facility] LAB  . SKIN TAG REMOVAL     11 removed  . TRANSESOPHAGEAL ECHOCARDIOGRAM (TEE) N/A 12/30/2015   Performed by Larey Dresser, MD at [facility] ENDOSCOPY   Family History: parents deceased, history of CNS aneurysms in family  Family History  Problem Relation Age of Onset  . Stroke Mother   . Hypertension Mother   . Aneurysm Mother 70       Died of brain aneursym  . Heart failure Father   . Emphysema Father   . Diabetes Mellitus II Sister   . Colon cancer Neg Hx   . Rectal cancer Neg Hx   . Stomach cancer Neg Hx    Family Psychiatric  History:history of depression in family, two cousins committed suicide Tobacco Screening: Have you used any form of tobacco in the last 30 days? (Cigarettes, Smokeless Tobacco, Cigars, and/or Pipes): No Social History: divorced, has 2 adult daughters and a grandson he is very close to. Lives alone, unemployed  Social History   Substance and Sexual Activity  Alcohol Use No  . Alcohol/week: 0.0 oz     Social History   Substance and Sexual Activity  Drug Use No    Additional Social History:  Allergies:   Allergies  Allergen Reactions  . Metformin Diarrhea   Lab Results:  Results for orders placed or performed during the hospital encounter of 04/13/17 (from the past 48 hour(s))  Hemoglobin A1c     Status: Abnormal   Collection Time: 04/14/17  3:28  AM  Result Value Ref Range   Hgb A1c MFr Bld 6.2 (H) 4.8 - 5.6 %    Comment: (NOTE) Pre diabetes:          5.7%-6.4% Diabetes:              >6.4% Glycemic control for   <7.0% adults with diabetes    Mean Plasma Glucose 131.24 mg/dL  Lipid panel     Status: Abnormal   Collection Time: 04/14/17  3:28 AM  Result Value Ref Range   Cholesterol 124 0 - 200 mg/dL   Triglycerides 107 <150 mg/dL   HDL 29 (L) >40 mg/dL   Total CHOL/HDL Ratio 4.3 RATIO   VLDL 21 0 - 40 mg/dL   LDL Cholesterol 74 0 - 99 mg/dL    Comment:        Total Cholesterol/HDL:CHD Risk Coronary Heart Disease Risk Table                     Men   Women  1/2 Average Risk   3.4   3.3  Average Risk       5.0   4.4  2 X Average Risk   9.6   7.1  3 X Average Risk  23.4   11.0        Use the calculated Patient Ratio above and the CHD Risk Table to determine the patient's CHD Risk.        ATP III CLASSIFICATION (LDL):  <100     mg/dL   Optimal  100-129  mg/dL   Near or Above                    Optimal  130-159  mg/dL   Borderline  160-189  mg/dL   High  >190     mg/dL   Very High   CBC     Status: Abnormal   Collection Time: 04/14/17  3:28 AM  Result Value Ref Range   WBC 9.2 4.0 - 10.5 K/uL   RBC 4.40 4.22 - 5.81 MIL/uL   Hemoglobin 12.5 (L) 13.0 - 17.0 g/dL   HCT 38.2 (L) 39.0 - 52.0 %   MCV 86.8 78.0 - 100.0 fL   MCH 28.4 26.0 - 34.0 pg   MCHC 32.7 30.0 - 36.0 g/dL   RDW 14.1 11.5 - 15.5 %   Platelets 305 150 - 400 K/uL  Comprehensive metabolic panel     Status: Abnormal   Collection Time: 04/14/17  3:28 AM  Result Value Ref Range   Sodium 138 135 - 145 mmol/L   Potassium 3.1 (L) 3.5 - 5.1 mmol/L   Chloride 104 101 - 111 mmol/L   CO2 26 22 - 32 mmol/L   Glucose, Bld 68 65 - 99 mg/dL   BUN 12 6 - 20 mg/dL   Creatinine, Ser 0.82 0.61 - 1.24 mg/dL   Calcium 8.2 (L) 8.9 - 10.3 mg/dL   Total Protein 5.8 (L) 6.5 - 8.1 g/dL   Albumin 3.0 (L) 3.5 - 5.0 g/dL   AST 15 15 - 41 U/L   ALT 16 (L) 17 - 63 U/L    Alkaline Phosphatase 80 38 - 126 U/L   Total Bilirubin 0.7 0.3 - 1.2 mg/dL   GFR calc non Af Amer >60 >60 mL/min   GFR calc Af Amer >60 >60 mL/min    Comment: (NOTE) The eGFR has been calculated using the CKD EPI equation. This calculation has not been validated in all clinical situations. eGFR's persistently <60 mL/min signify possible Chronic Kidney Disease.    Anion gap 8 5 - 15    Blood Alcohol level:  Lab Results  Component Value Date   ETH <10 04/08/2017   ETH <10 34/19/3790    Metabolic Disorder Labs:  Lab Results  Component Value Date   HGBA1C 6.2 (H) 04/14/2017   MPG 131.24 04/14/2017   MPG 183 06/16/2016   No results found for: PROLACTIN Lab Results  Component Value Date   CHOL 124 04/14/2017   TRIG 107 04/14/2017   HDL 29 (L) 04/14/2017   CHOLHDL 4.3 04/14/2017   VLDL 21 04/14/2017   LDLCALC 74 04/14/2017   LDLCALC 94 06/16/2016    Current Medications: Current Facility-Administered Medications  Medication Dose Route Frequency Provider Last Rate Last Dose  . acetaminophen (TYLENOL) tablet 650 mg  650 mg Oral Q6H PRN Lindon Romp A, NP   650 mg at 04/15/17 1302  . alum & mag hydroxide-simeth (MAALOX/MYLANTA) 200-200-20 MG/5ML suspension 30 mL  30 mL Oral Q4H PRN Lindon Romp A, NP      . amLODipine (NORVASC) tablet 10 mg  10 mg Oral Daily Lindon Romp A, NP   10 mg at 04/15/17 0804  . aspirin EC tablet 81 mg  81 mg Oral Daily Lindon Romp A, NP   81 mg at 04/15/17 0804  . atorvastatin (LIPITOR) tablet 80 mg  80 mg Oral q1800 Lindon Romp A, NP   80 mg at 04/14/17 2119  . chlorthalidone (HYGROTON) tablet 25 mg  25 mg Oral Daily Lindon Romp A, NP   25 mg at 04/15/17 0807  . DULoxetine (CYMBALTA) DR capsule 40 mg  40 mg Oral Daily Lindon Romp A, NP   40 mg at 04/15/17 0805  . gabapentin (NEURONTIN) capsule 300 mg  300 mg Oral QHS Lindon Romp A, NP   300 mg at 04/14/17 2119  . glipiZIDE (GLUCOTROL XL) 24 hr tablet 10 mg  10 mg Oral Q breakfast Lindon Romp  A, NP   10 mg at 04/15/17 9163  . hydrOXYzine (ATARAX/VISTARIL) tablet 25 mg  25 mg Oral TID PRN Rozetta Nunnery, NP      . Influenza vac split quadrivalent PF (FLUARIX) injection 0.5 mL  0.5 mL Intramuscular Tomorrow-1000 Cobos, Fernando A, MD      . lisinopril (PRINIVIL,ZESTRIL) tablet 40 mg  40 mg Oral Daily Lindon Romp A, NP   40 mg at 04/15/17 0807  . magnesium hydroxide (MILK OF MAGNESIA) suspension 30 mL  30 mL Oral Daily PRN Lindon Romp A, NP      . pantoprazole (PROTONIX) EC tablet 40 mg  40 mg Oral Daily Lindon Romp A, NP   40 mg at 04/15/17 0805  . pneumococcal 23 valent vaccine (PNU-IMMUNE) injection 0.5 mL  0.5 mL Intramuscular Tomorrow-1000 Cobos, Fernando A, MD      . traZODone (DESYREL) tablet 50 mg  50 mg Oral QHS PRN Rozetta Nunnery, NP   50 mg at 04/14/17 2119   PTA Medications: Medications Prior to Admission  Medication Sig Dispense Refill Last Dose  . amLODipine (NORVASC) 10 MG tablet Take 1 tablet (10 mg total) by mouth daily. 30 tablet 0 04/08/2017 at Unknown time  . aspirin EC 81 MG EC tablet Take 1 tablet (81 mg total) daily by mouth. 30 tablet 0   . atorvastatin (LIPITOR) 80 MG tablet Take 1 tablet (80 mg total) daily at 6 PM by mouth. 30 tablet 0   . chlorthalidone (HYGROTON) 25 MG tablet Take 25 mg daily by mouth.    04/08/2017 at Unknown time  . DULoxetine 40 MG CPEP Take 40 mg daily by mouth. 30 capsule 0   . gabapentin (NEURONTIN) 300 MG capsule Take 300 mg by mouth at bedtime.   04/07/2017 at Unknown time  . glipiZIDE (GLUCOTROL XL) 10 MG 24 hr tablet Take 10 mg daily with breakfast by mouth.   04/08/2017 at Unknown time  .  hydrOXYzine (ATARAX/VISTARIL) 25 MG tablet Take 1 tablet (25 mg total) 3 (three) times daily as needed by mouth for anxiety. 90 tablet 0   . lisinopril (PRINIVIL,ZESTRIL) 40 MG tablet Take 40 mg by mouth daily.   04/08/2017 at Unknown time  . pantoprazole (PROTONIX) 40 MG tablet Take 1 tablet (40 mg total) by mouth daily. 30 tablet 0 Not  Taking at Unknown time  . traZODone (DESYREL) 50 MG tablet Take 1 tablet (50 mg total) at bedtime as needed by mouth for sleep. 30 tablet 0     Musculoskeletal: Strength & Muscle Tone: within normal limits Gait & Station: normal Patient leans: N/A  Psychiatric Specialty Exam: Physical Exam  Review of Systems  Constitutional: Negative.   HENT: Negative.   Eyes: Negative.   Respiratory: Negative.   Cardiovascular: Negative.   Gastrointestinal: Negative.   Genitourinary: Negative.   Musculoskeletal: Negative.   Skin: Negative.   Neurological: Negative.        Today denies weakness or numbness   Endo/Heme/Allergies: Negative.   Psychiatric/Behavioral: Positive for depression.  All other systems reviewed and are negative.   Blood pressure 132/77, pulse 97, temperature 98.2 F (36.8 C), temperature source Oral, resp. rate 20, height '5\' 8"'$  (1.727 m), weight 114.3 kg (252 lb).Body mass index is 38.32 kg/m.  General Appearance: Fairly Groomed  Eye Contact:  Good  Speech:  Normal Rate  Volume:  Normal  Mood:  reports his mood is significantly better than it had been prior to admission  Affect:  appropriate, more reactive, still vaguely depressed   Thought Process:  Linear and Descriptions of Associations: Intact  Orientation:  Full (Time, Place, and Person)  Thought Content:  denies hallucinations, no delusions , not internally preoccupied   Suicidal Thoughts:  No denies current suicidal or self injurious ideations, denies homicidal or violent ideations   Homicidal Thoughts:  No  Memory:  recent and remote grossly intact  Judgement:  Other:  improving   Insight:  improving   Psychomotor Activity:  Normal  Concentration:  Concentration: Good and Attention Span: Good  Recall:  Good  Fund of Knowledge:  Good  Language:  Good  Akathisia:  Negative  Handed:  Right  AIMS (if indicated):     Assets:  Communication Skills Desire for Improvement Resilience  ADL's:  Intact   Cognition:  WNL  Sleep:  Number of Hours: 6.5    Treatment Plan Summary: Daily contact with patient to assess and evaluate symptoms and progress in treatment, Medication management, Plan inpatient treatment and medications as below  Observation Level/Precautions:  15 minute checks  Laboratory:  as needed   Psychotherapy:  Milieu, group therapy   Medications:  Continue Campral 40 mgrs QDAY for depression, anxiety   Consultations: as needed     Discharge Concerns:  -  Estimated LOS: 2-3  Days   Other:     Physician Treatment Plan for Primary Diagnosis:  MDD Long Term Goal(s): Improvement in symptoms so as ready for discharge  Short Term Goals: Ability to identify changes in lifestyle to reduce recurrence of condition will improve and Ability to maintain clinical measurements within normal limits will improve  Physician Treatment Plan for Secondary Diagnosis: Active Problems:   Severe major depression ([place])  Long Term Goal(s): Improvement in symptoms so as ready for discharge  Short Term Goals: Ability to verbalize feelings will improve, Ability to disclose and discuss suicidal ideas, Ability to demonstrate self-control will improve and Ability to identify and develop effective  coping behaviors will improve  I certify that inpatient services furnished can reasonably be expected to improve the patient's condition.    Jenne Campus, MD 11/18/20181:29 PM

## 2017-04-16 LAB — BASIC METABOLIC PANEL
ANION GAP: 7 (ref 5–15)
BUN: 15 mg/dL (ref 6–20)
CALCIUM: 8.8 mg/dL — AB (ref 8.9–10.3)
CHLORIDE: 104 mmol/L (ref 101–111)
CO2: 26 mmol/L (ref 22–32)
CREATININE: 0.86 mg/dL (ref 0.61–1.24)
GFR calc non Af Amer: >60 mL/min (ref >60)
Glucose, Bld: 92 mg/dL (ref 65–99)
Potassium: 3.5 mmol/L (ref 3.5–5.1)
SODIUM: 137 mmol/L (ref 135–145)

## 2017-04-16 LAB — GLUCOSE, CAPILLARY: GLUCOSE-CAPILLARY: 88 mg/dL (ref 65–99)

## 2017-04-16 MED ORDER — GABAPENTIN 300 MG PO CAPS: 300.0000 mg | ORAL_CAPSULE | Freq: Every day | ORAL | 0 refills | Status: DC

## 2017-04-16 MED ORDER — DULOXETINE HCL 40 MG PO CPEP: 40.0000 mg | ORAL_CAPSULE | Freq: Every day | ORAL | 0 refills | Status: DC

## 2017-04-16 MED ORDER — TRAZODONE HCL 50 MG PO TABS: 50.0000 mg | ORAL_TABLET | Freq: Every evening | ORAL | 0 refills | Status: DC | PRN

## 2017-04-16 MED ORDER — HYDROXYZINE HCL 25 MG PO TABS: 25.0000 mg | ORAL_TABLET | Freq: Every evening | ORAL | 0 refills | Status: DC | PRN

## 2017-04-16 NOTE — Tx Team (Signed)
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 acetaminophen (TYLENOL) tablet 650 mg  650 mg Oral Q6H PRN Lindon Romp A, NP   650 mg at 04/16/17 1138  . alum & mag hydroxide-simeth (MAALOX/MYLANTA) 200-200-20 MG/5ML suspension 30 mL  30 mL Oral Q4H PRN Lindon Romp A, NP      . amLODipine (NORVASC) tablet 10 mg  10 mg Oral Daily Lindon Romp A, NP   10 mg at 04/16/17 0916  . aspirin EC tablet 81 mg  81 mg Oral Daily Lindon Romp A, NP   81 mg at 04/16/17 0917  . atorvastatin (LIPITOR) tablet 80 mg  80 mg Oral q1800 Lindon Romp A, NP   80 mg at 04/15/17 1712  . chlorthalidone (HYGROTON) tablet 25 mg  25 mg Oral Daily Lindon Romp A, NP   25 mg at 04/16/17 0915  . DULoxetine (CYMBALTA) DR capsule 40 mg  40 mg Oral Daily Lindon Romp A, NP   40 mg at 04/16/17 0916  . gabapentin (NEURONTIN) capsule 300 mg  300 mg Oral QHS Lindon Romp A, NP   300 mg at 04/15/17 2114  . glipiZIDE (GLUCOTROL XL) 24 hr tablet 10 mg  10 mg Oral Q breakfast Lindon Romp A, NP   10 mg at 04/16/17 0915  . hydrOXYzine (ATARAX/VISTARIL) tablet 25 mg  25 mg Oral TID PRN Rozetta Nunnery, NP   25 mg at 04/15/17 2117  . hydrOXYzine (ATARAX/VISTARIL) tablet 25 mg  25 mg Oral QHS,MR X 1 Lindon Romp A, NP   25 mg at 04/15/17 2341  . lisinopril (PRINIVIL,ZESTRIL) tablet 40 mg  40 mg Oral Daily Lindon Romp A, NP   40 mg at 04/16/17 0916  . magnesium hydroxide (MILK OF MAGNESIA) suspension 30 mL  30 mL Oral Daily PRN Lindon Romp A, NP      . pantoprazole (PROTONIX) EC tablet 40 mg  40 mg Oral Daily Lindon Romp A, NP   40 mg at 04/16/17 0916  . traZODone (DESYREL) tablet 50 mg  50 mg Oral QHS PRN  Rozetta Nunnery, NP   50 mg at 04/15/17 2114    PTA Medications: Medications Prior to Admission  Medication Sig Dispense Refill Last Dose  . amLODipine (NORVASC) 10 MG tablet Take 1 tablet (10 mg total) by mouth daily. 30 tablet 0 04/08/2017 at Unknown time  . aspirin EC 81 MG EC tablet Take 1 tablet (81 mg total) daily by mouth. 30 tablet 0   . atorvastatin (LIPITOR) 80 MG tablet Take 1 tablet (80 mg total) daily at 6 PM by mouth. 30 tablet 0   . chlorthalidone (HYGROTON) 25 MG tablet Take 25 mg daily by mouth.    04/08/2017 at Unknown time  . DULoxetine 40 MG CPEP Take 40 mg daily by mouth. 30 capsule 0   . gabapentin (NEURONTIN) 300 MG capsule Take 300 mg by mouth at bedtime.   04/07/2017 at Unknown time  . glipiZIDE (GLUCOTROL XL) 10 MG 24 hr tablet Take 10 mg daily with breakfast by mouth.   04/08/2017 at Unknown time  . hydrOXYzine (ATARAX/VISTARIL) 25 MG tablet Take 1 tablet (25 mg total) 3 (three) times daily as needed  by mouth for anxiety. 90 tablet 0   . lisinopril (PRINIVIL,ZESTRIL) 40 MG tablet Take 40 mg by mouth daily.   04/08/2017 at Unknown time  . pantoprazole (PROTONIX) 40 MG tablet Take 1 tablet (40 mg total) by mouth daily. 30 tablet 0 Not Taking at Unknown time  . traZODone (DESYREL) 50 MG tablet Take 1 tablet (50 mg total) at bedtime as needed by mouth for sleep. 30 tablet 0     Treatment Modalities: Medication Management, Group therapy, Case management,  1 to 1 session with clinician, Psychoeducation, Recreational therapy.  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  Therapeutic Interventions: 1 to 1 sessions, Unit Group sessions and Medication administration.  Evaluation of Outcomes: Adequate for Discharge  Physician Treatment Plan for Secondary Diagnosis: Active Problems:   Severe major depression ([name])  Long Term Goal(s): Improvement in symptoms so as ready for discharge  Short Term Goals: Ability to identify changes in lifestyle to reduce recurrence of condition will improve Ability to maintain clinical measurements within normal limits will improve Ability to verbalize feelings will improve Ability to disclose and discuss suicidal ideas Ability to demonstrate self-control will improve Ability to identify and develop effective coping behaviors will improve  Medication Management: Evaluate patient's response, side effects, and tolerance of medication regimen.  Therapeutic Interventions: 1 to 1 sessions, Unit Group sessions and Medication administration.  Evaluation of Outcomes: Adequate for Discharge  RN Treatment Plan for Primary Diagnosis: MDD, Severe, No Psychotic    Long Term Goal(s): Knowledge of disease and therapeutic regimen to maintain health will improve  Short Term Goals: Compliance with prescribed medications will improve  Medication Management: RN will administer medications as ordered by provider, will assess and evaluate patient's response and provide education to patient for prescribed medication. RN will report any adverse and/or side effects to prescribing provider.  Therapeutic Interventions: 1 on 1 counseling sessions, Psychoeducation, Medication administration,  Evaluate responses to treatment, Monitor vital signs and CBGs as ordered, Perform/monitor CIWA, COWS, AIMS and Fall Risk screenings as ordered, Perform wound care treatments as ordered.  Evaluation of Outcomes: Adequate for Discharge  LCSW Treatment Plan for Primary Diagnosis: MDD, Severe, No Psychotic    Long Term Goal(s): Safe transition to appropriate next level of care at discharge, Engage patient in therapeutic group addressing interpersonal concerns. Short Term Goals: Engage patient in aftercare planning with referrals and resources, Increase emotional regulation, Identify triggers associated with mental health/substance abuse issues and Increase skills for wellness and recovery  Therapeutic Interventions: Assess for all discharge needs, 1 to 1 time with Social worker, Explore available resources and support systems, Assess for adequacy in community support network, Educate family and significant other(s) on suicide prevention, Complete Psychosocial Assessment, Interpersonal group therapy.  Evaluation of Outcomes: Adequate for Discharge  Progress in Treatment: Attending groups: Yes  Participating in groups: Yes Taking medication as prescribed: Yes, MD continues  to assess for medication changes as needed Toleration medication: Yes, no side effects reported at this time Family/Significant other contact made: Yes, pt's daughter contacted. Patient understands diagnosis: Yes, AEB pt's willingness to participate in treatment. Discussing patient identified problems/goals with staff: Yes Medical problems stabilized or resolved: Yes Denies suicidal/homicidal ideation: Yes Issues/concerns per patient self-inventory: None Other: N/A  New problem(s) identified: None identified at this time.   New Short Term/Long Term Goal(s): None identified at this time.   Discharge Plan or Barriers: Pt will return home and follow up outpatient with [facility].  Reason for Continuation of  Hospitalization:  None identified at this time.  Estimated Length of Stay: 0 days; Pt will likely discharge today 04/16/17  Attendees: Patient: 04/16/2017 11:45 AM  Physician: Dr. Parke Poisson 04/16/2017 11:45 AM  Nursing: Trinna Post RN; Sharl Ma, RN 04/16/2017 11:45 AM  RN Care Manager: Lars Pinks, RN 04/16/2017 11:45 AM  Social Worker: Matthew Saras, [place] 04/16/2017 11:45 AM  Recreational Therapist:  04/16/2017 11:45 AM  Other: Lindell Spar, NP 04/16/2017 11:45 AM  Other:  04/16/2017 11:45 AM  Other: 04/16/2017 11:45 AM  Scribe for Treatment Team: Georga Kaufmann, MSW,LCSWA 04/16/2017 11:45 AM

## 2017-04-16 NOTE — Tx Team (Signed)
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 acetaminophen (TYLENOL) tablet 650 mg  650 mg Oral Q6H PRN Ethelene Hal, NP   650 mg at 04/10/17 6222  . alum & mag hydroxide-simeth (MAALOX/MYLANTA) 200-200-20 MG/5ML suspension 30 mL  30 mL Oral Q4H PRN Ethelene Hal, NP      . amLODipine (NORVASC) tablet 10 mg  10 mg Oral Daily Ethelene Hal, NP   10 mg at 04/11/17 0815  . atorvastatin (LIPITOR) tablet 40 mg  40 mg Oral q1800 Ethelene Hal, NP   40 mg at 04/10/17 1708  . chlorthalidone (HYGROTON) tablet 25 mg  25 mg Oral Daily Ethelene Hal, NP   25 mg at 04/11/17 0816  . DULoxetine (CYMBALTA) DR capsule 30 mg  30 mg Oral Daily Cobos, Myer Peer, MD   30 mg at 04/11/17 0815  . gabapentin (NEURONTIN) capsule 300 mg  300 mg Oral QHS Ethelene Hal, NP   300 mg at 04/10/17 2139  . glipiZIDE (GLUCOTROL XL) 24 hr tablet 10 mg  10 mg Oral Q breakfast Ethelene Hal, NP   10 mg at 04/11/17 0815  . hydrOXYzine (ATARAX/VISTARIL) tablet 25 mg  25 mg Oral TID PRN Ethelene Hal, NP   25 mg at 04/10/17 2138  . lisinopril (PRINIVIL,ZESTRIL) tablet 40 mg  40 mg Oral Daily Ethelene Hal, NP   40 mg at 04/11/17 0815  . magnesium hydroxide (MILK OF MAGNESIA) suspension 30 mL  30 mL Oral Daily PRN Ethelene Hal, NP      . metoprolol tartrate (LOPRESSOR) tablet 50 mg  50 mg Oral BID Ethelene Hal, NP   50 mg at 04/11/17 0815  . traZODone (DESYREL) tablet 50 mg  50 mg Oral QHS PRN Ethelene Hal, NP   50 mg at 04/10/17 2139     PTA Medications:        Medications Prior to Admission  Medication Sig Dispense Refill Last Dose  . amLODipine (NORVASC) 10 MG tablet Take 1 tablet (10 mg total) by mouth daily. 30 tablet 0 04/08/2017 at Unknown time  . atorvastatin (LIPITOR) 20 MG tablet Take 20 mg daily by mouth.   04/08/2017 at Unknown time  . atorvastatin (LIPITOR) 40 MG tablet Take 1 tablet (40 mg total) by mouth daily at 6 PM. 30 tablet 0 04/08/2017 at Unknown time  . chlorthalidone (HYGROTON) 25 MG tablet Take 25 mg daily by mouth.    04/08/2017 at Unknown time  . clopidogrel (PLAVIX) 75 MG tablet Take 1 tablet (75 mg total) by mouth daily. (Patient not taking: Reported on 03/12/2017) 30 tablet 0 Not Taking at Unknown time  . FLUoxetine (PROZAC) 20 MG capsule Take 1 capsule (20 mg total) by mouth daily. Take with 40 mg for a total of 60 mg 90 capsule 1 04/08/2017 at Unknown time  . gabapentin (NEURONTIN) 300 MG capsule Take 300 mg by mouth at bedtime.   04/07/2017 at Unknown time  . glipiZIDE (GLUCOTROL XL) 10 MG 24 hr tablet Take 10 mg daily with breakfast by mouth.   04/08/2017 at  Unknown time  . indapamide (LOZOL) 1.25 MG tablet Take 1.25 mg daily by mouth.    04/08/2017 at Unknown time  . lisinopril (PRINIVIL,ZESTRIL) 40 MG tablet Take 40 mg by mouth daily.   04/08/2017 at Unknown time  . meclizine (ANTIVERT) 32 MG tablet Take 1 tablet (32 mg total) by mouth 3 (three) times daily as needed. (Patient not taking: Reported on 04/02/2017) 30 tablet 0 Not Taking at Unknown time  . metoprolol (LOPRESSOR) 50 MG tablet Take 1 tablet (50 mg total) by mouth 2 (two) times daily. 60 tablet 0 04/08/2017 at 1000  . pantoprazole (PROTONIX) 40 MG tablet Take 1 tablet (40 mg total) by mouth daily. (Patient not taking: Reported on 04/02/2017) 30 tablet 0 Not Taking at Unknown time    Treatment Modalities: Medication Management, Group therapy, Case management,  1 to 1 session with clinician, Psychoeducation,  Recreational therapy.  Patient Stressors: Health problems Medication change or noncompliance Patient Strengths: Ability for insight Average or above average intelligence Capable of independent living General fund of knowledge Motivation for treatment/growth  Physician Treatment Plan for Primary Diagnosis: MDD, Severe, No Psychotic    Long Term Goal(s): Improvement in symptoms so as ready for discharge Short Term Goals: Ability to identify changes in lifestyle to reduce recurrence of condition will improve Ability to identify triggers associated with substance abuse/mental health issues will improve Ability to verbalize feelings will improve Ability to disclose and discuss suicidal ideas Ability to demonstrate self-control will improve Ability to identify and develop effective coping behaviors will improve  Medication Management: Evaluate patient's response, side effects, and tolerance of medication regimen.  Therapeutic Interventions: 1 to 1 sessions, Unit Group sessions and Medication administration.  Evaluation of Outcomes: Progressing  Physician Treatment Plan for Secondary Diagnosis: Active Problems:   MDD (major depressive disorder), recurrent severe, without psychosis ([place])  Long Term Goal(s): Improvement in symptoms so as ready for discharge  Short Term Goals: Ability to identify changes in lifestyle to reduce recurrence of condition will improve Ability to identify triggers associated with substance abuse/mental health issues will improve Ability to verbalize feelings will improve Ability to disclose and discuss suicidal ideas Ability to demonstrate self-control will improve Ability to identify and develop effective coping behaviors will improve  Medication Management: Evaluate patient's response, side effects, and tolerance of medication regimen.  Therapeutic Interventions: 1 to 1 sessions, Unit Group sessions and Medication administration.  Evaluation  of Outcomes: Progressing  RN Treatment Plan for Primary Diagnosis: MDD, Severe, No Psychotic    Long Term Goal(s): Knowledge of disease and therapeutic regimen to maintain health will improve  Short Term Goals: Compliance with prescribed medications will improve  Medication Management: RN will administer medications as ordered by provider, will assess and evaluate patient's response and provide education to patient for prescribed medication. RN will report any adverse and/or side effects to prescribing provider.  Therapeutic Interventions: 1 on 1 counseling sessions, Psychoeducation, Medication administration, Evaluate responses to treatment, Monitor vital signs and CBGs as ordered, Perform/monitor CIWA, COWS, AIMS and Fall Risk screenings as ordered, Perform wound care treatments as ordered.  Evaluation of Outcomes: Progressing  LCSW Treatment Plan for Primary Diagnosis: MDD, Severe, No Psychotic    Long Term Goal(s): Safe transition to appropriate next level of care at discharge, Engage patient in therapeutic group addressing interpersonal concerns. Short Term Goals: Engage patient in aftercare planning with referrals and resources, Increase emotional regulation, Identify triggers associated with mental health/substance abuse issues and Increase skills for wellness and recovery  Therapeutic Interventions: Assess for all discharge needs, 1 to 1 time with Education officer, museum, Explore available resources and support systems, Assess for adequacy in community support network, Educate family and significant other(s) on suicide prevention, Complete Psychosocial Assessment, Interpersonal group therapy.  Evaluation of Outcomes: Progressing  Progress in Treatment: Attending groups: Yes  Participating in groups: Yes Taking medication as prescribed: Yes, MD continues to assess for medication changes as needed Toleration medication: Yes, no side effects reported at this  time Family/Significant other contact made: No, CSW attempting contact with pt's daughter. Patient understands diagnosis: Developing insight Discussing patient identified problems/goals with staff: Yes Medical problems stabilized or resolved: Yes Denies suicidal/homicidal ideation: Yes Issues/concerns per patient self-inventory: None Other: N/A  New problem(s) identified: None identified at this time.   New Short Term/Long Term Goal(s): None identified at this time.   Discharge Plan or Barriers: Pt will return home and follow up outpatient with [facility]  Reason for Continuation of Hospitalization:  Anxiety  Depression Medication stabilization Suicidal ideation  Estimated Length of Stay: 1-3 days; Estimated discharge date 04/14/17  Attendees: Patient: 04/11/2017 11:53 AM  Physician: Dr. Parke Poisson 04/11/2017 11:53 AM  Nursing: Trinna Post RN; Chong Sicilian, RN 04/11/2017 11:53 AM  RN Care Manager: Lars Pinks, RN 04/11/2017 11:53 AM  Social Worker: Matthew Saras, [place] 04/11/2017 11:53 AM  Recreational Therapist:  04/11/2017 11:53 AM  Other: Lindell Spar, NP 04/11/2017 11:53 AM  Other:  04/11/2017 11:53 AM  Other: 04/11/2017 11:53 AM  Scribe for Treatment Team: Georga Kaufmann, MSW,LCSWA 04/11/2017 11:53 AM           Cosigned by: Jenne Campus, MD at 04/15/2017 5:34 PM  Electronically signed by Georga Kaufmann, LCSWA at 04/11/2017 11:56 AM Electronically signed by Jenne Campus, MD at 04/15/2017 5:34 PM

## 2017-04-16 NOTE — Progress Notes (Signed)
Patient discharged to lobby. Patient was stable and appreciative at that time. All papers and prescriptions were given and valuables returned. Verbal understanding expressed. Denies SI/HI and A/VH. Patient given opportunity to express concerns and ask questions.  

## 2017-04-16 NOTE — [facility] Suicide Risk Assessment (Signed)
[facility] Discharge Suicide Risk Assessment   Principal Problem: Severe major depression [facility]) Discharge Diagnoses:  Patient Active Problem List   Diagnosis Date Noted  . Severe major depression ([place]) [F32.2] 04/14/2017  . Major depressive disorder, recurrent ([place]) [F33.9] 04/09/2017  . MDD (major depressive disorder), recurrent severe, without psychosis ([place]) [F33.2] 04/09/2017  . Family history of brain aneurysm [Z82.49]   . Intractable vomiting [R11.10] 10/20/2016  . History of intracranial aneurysm [Z86.79]   . Hypokalemia [E87.6]   . AKI (acute kidney injury) ([place]) [N17.9]   . Prolonged QT interval [R94.31]   . Lactic acidosis [E87.2]   . Dysphagia [R13.10]   . Esophageal ring [K22.2]   . Gastroesophageal reflux disease with esophagitis [K21.0]   . Nausea and vomiting [R11.2] 10/01/2016  . 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 Severe episode of recurrent major depressive disorder, without psychotic features ([place]) [F33.2] 07/20/2016  . CVA (cerebral vascular accident) ([place]) [I63.9] 06/15/2016  . Abdominal pain [R10.9]   . Numbness [R20.0]   . Carpal tunnel syndrome, bilateral [G56.03] 12/16/2015  . Status post stroke [Z86.73] 11/12/2015  . Transient neurologic deficit [R29.818] 10/15/2015  . Cerebrovascular accident, late effects [I69.90] 10/07/2015  . Complicated migraine [Z61.096]   . Anxiety state [F41.1]   . 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 Cerebral infarction due to embolism of right middle cerebral artery ([place]) [I63.411] 09/23/2015  . Cerebral infarction due to embolism of cerebral artery ([place]) [I63.40] 09/23/2015  . Gait disturbance, post-stroke [I69.398, R26.9]   . Drooping of mouth [R29.810]   . Cerebrovascular accident (CVA) due to embolism of right anterior cerebral artery ([place]) [I63.421]   . Benign essential HTN [I10]   . Migraine with aura and without status migrainosus, not intractable [G43.109]   . Tachypnea [R06.82]   . Prediabetes [R73.03]    . Acute blood loss anemia [D62]   . Brain aneurysm [I67.1] 09/20/2015  . Type 2 diabetes mellitus ([place]) [E11.9] 09/15/2015  . Leukocytosis [D72.829] 09/15/2015  . Aneurysm, cerebral, nonruptured [I67.1] 09/15/2015  . TIA (transient ischemic attack) [G45.9] 09/14/2015  . Temporary cerebral vascular dysfunction [G93.9] 09/14/2015  . H/O transient cerebral ischemia [Z86.73] 07/29/2015  . At risk for falling [Z91.81] 07/29/2015  . Bilateral hearing loss [H91.93] 10/25/2014  . Abnormal fear [F40.9] 08/08/2012  . Headache, migraine [G43.909] 08/08/2012  . Arthritis, degenerative [M19.90] 08/08/2012  . History of tear of ACL (anterior cruciate ligament) [Z87.828] 03/08/2011  . History of knee problem [Z87.39] 03/08/2011  . Depression [F32.9] 09/07/2010  . Depressive disorder, not elsewhere classified [F32.9] 09/07/2010  . URI (upper respiratory infection) [J06.9] 08/24/2010  . Hearing loss [H91.90] 09/06/2006  . Hyperlipidemia [E78.5] 04/23/2006  . Essential hypertension [I10] 04/23/2006  . Psychophysiological insomnia [F51.04] 04/23/2006    Total Time spent with patient: 30 minutes  Musculoskeletal: Strength & Muscle Tone: within normal limits Gait & Station: normal Patient leans: N/A  Psychiatric Specialty Exam: ROS no chest pain, no shortness of breath, no vomiting , chronic hypoacusia  Blood pressure 130/71, pulse (!) 103, temperature 97.6 F (36.4 C), temperature source Oral, resp. rate 18, height 5\' 8"  (1.727 m), weight 114.3 kg (252 lb).Body mass index is 38.32 kg/m.  General Appearance: improving grooming   Eye Contact::  Good  Speech:  Normal Rate409  Volume:  Normal  Mood:  improved and denies feeling depressed at this time  Affect:  more reactive, fuller in range   Thought Process:  Linear and Descriptions of Associations: Intact  Orientation:  Full (Time,  Place, and Person)  Thought Content:  no hallucinations, no delusions, not internally preoccupied   Suicidal  Thoughts:  No denies suicidal or self injurious ideations, denies homicidal ideations, future oriented at this time, looking forward to spending holidays with daughter and grandson   Homicidal Thoughts:  No  Memory:  recent and remote grossly intact   Judgement:  Other:  improving   Insight:  Fair and improving   Psychomotor Activity:  Normal  Concentration:  Good  Recall:  Good  Fund of Knowledge:Good  Language: Good  Akathisia:  Negative  Handed:  Right  AIMS (if indicated):     Assets:  Communication Skills Desire for Improvement Resilience  Sleep:  Number of Hours: 6.5  Cognition: WNL  ADL's:  Intact   Mental Status Per Nursing Assessment::   On Admission:  Suicidal ideation indicated by patient  Demographic Factors:  Divorced, has two adult children, lives alone, currently unemployed   Loss Factors: Chronic medical illness, unemployment  Historical Factors: Depression, no history of mania or of psychosis.   Risk Reduction Factors:   Sense of responsibility to family and Positive coping skills or problem solving skills  Continued Clinical Symptoms:  At this time patient presents significantly improved compared to original admission presentation. Presents fully alert, attentive, well related, mood is described as improved, minimizes depression at this time, and affect is brighter and more reactive, no thought disorder, no suicidal or self injurious ideations, no homicidal ideations, future oriented . Currently identifies love for his grandson ( states he is male role model, father figure for grandson) as protective factor against suicide and states he plans to spend holidays with family. Has worked on suicide prevention plan and states he realizes that his adult children are more involved and supportive than he thought and that he can access them for support . Denies medication side effects. Behavior on unit in good control, pleasant on approach   Cognitive Features That  Contribute To Risk:  No gross cognitive deficits noted upon discharge. Is alert , attentive, and oriented x 3   Suicide Risk:  Mild:  Suicidal ideation of limited frequency, intensity, duration, and specificity.  There are no identifiable plans, no associated intent, mild dysphoria and related symptoms, good self-control (both objective and subjective assessment), few other risk factors, and identifiable protective factors, including available and accessible social support.  Follow-up Information    BEHAVIORAL HEALTH INTENSIVE PSYCH Follow up on 04/26/2017.   Specialty:  Behavioral Health Why:  Assessment appointment for the Intensive Outpatient Program 11/29 at 8:45am. Please call the office if you need to cancel or reschedule your appointment. Thank you. Contact information: [place] [id number] [place] [phone]          Plan Of Care/Follow-up recommendations:  Activity:  as tolerated  Diet:  Heart Healthy Tests:  NA Other:  See below Patient is expressing readiness for discharge and is leaving unit in good spirits Plans to return home Plans to follow up as above  He has established outpatient neurologist ( Dr. Guerry Bruin) and neurosurgeon ( Dr. Mina Marble) at [facility] for management of history of CVA and CNS aneurysm. Also has a PCP , Dr. Ronnald Ramp, for medical management as needed .  Jenne Campus, MD 04/16/2017, 12:45 PM

## 2017-04-16 NOTE — Progress Notes (Signed)
  [facility] Adult Case Management Discharge Plan :  Will you be returning to the same living situation after discharge:  Yes,  pt returning home. At discharge, do you have transportation home?: Yes,  pt's sister will transport. Do you have the ability to pay for your medications: Yes,  prescriptions and samples provided.  Release of information consent forms completed and in the chart;  Patient's signature needed at discharge.  Patient to Follow up at: Follow-up Information    BEHAVIORAL HEALTH INTENSIVE PSYCH Follow up on 04/26/2017.   Specialty:  Behavioral Health Why:  Assessment appointment for the Intensive Outpatient Program 11/29 at 8:45am. Please call the office if you need to cancel or reschedule your appointment. Thank you. Contact information: [place] [id number] [place] [phone]          Next level of care provider has access to [place] and Suicide Prevention discussed: Yes,  with pt and with pt's daughter.  Have you used any form of tobacco in the last 30 days? (Cigarettes, Smokeless Tobacco, Cigars, and/or Pipes): No  Has patient been referred to the Quitline?: N/A patient is not a smoker  Patient has been referred for addiction treatment: [place], MSW, LCSWA 04/16/2017, 11:43 AM

## 2017-04-16 NOTE — [facility] Suicide Risk Assessment (Signed)
[place] INPATIENT:  Family/Significant Other Suicide Prevention Education  Suicide Prevention Education:  Education Completed; Datrell Dunton (daughter [phone]), has been identified by the patient as the family member/significant other with whom the patient will be residing, and identified as the person(s) who will aid the patient in the event of a mental health crisis (suicidal ideations/suicide attempt).  With written consent from the patient, the family member/significant other has been provided the following suicide prevention education, prior to the and/or following the discharge of the patient.  The suicide prevention education provided includes the following:  Suicide risk factors  Suicide prevention and interventions  National Suicide Hotline telephone number  [facility] assessment telephone number  [facility] Emergency Assistance [place] and/or Residential Mobile Crisis Unit telephone number  Request made of family/significant other to:  Remove weapons (e.g., guns, rifles, knives), all items previously/currently identified as safety concern.    Remove drugs/medications (over-the-counter, prescriptions, illicit drugs), all items previously/currently identified as a safety concern.  The family member/significant other verbalizes understanding of the suicide prevention education information provided.  The family member/significant other agrees to remove the items of safety concern listed above.  Pt's daughter states that pt seems stable for discharge. Pt's daughter also confirmed that pt does not have access to guns or weapons at home.  Georga Kaufmann, MSW, LCSWA  04/16/2017, 11:21 AM

## 2017-04-16 NOTE — Progress Notes (Signed)
Recreation Therapy Notes  Date: 04/16/17 Time: 0930 Location:  [address] Dayroom  Group Topic: Stress Management  Goal Area(s) Addresses:  Patient will verbalize importance of using healthy stress management.  Patient will identify positive emotions associated with healthy stress management.   Behavioral Response: Engaged  Intervention: Stress Management  Activity :  Meditation.  LRT introduced the stress management technique of meditation.  LRT read a script for patients to exam the things they need to shed the baggage they have been carrying to prepare for the new things to come.  Education:  Stress Management, Discharge Planning.   Education Outcome: Acknowledges edcuation/In group clarification offered/Needs additional education  Clinical Observations/Feedback: Pt attended group.     Victorino Sparrow, LRT/CTRS         Victorino Sparrow A 04/16/2017 11:24 AM

## 2017-04-16 NOTE — Discharge Summary (Signed)
Physician Discharge Summary Note  Patient:  [name] is an 61 y.o., male MRN:  [id number] DOB:  [birth year] Patient phone:  [phone] (home)  Patient address:   [address] [place] [address],  Total Time spent with patient: 30 minutes  Date of Admission:  04/14/2017 Date of Discharge: 04/16/2017  Reason for Admission: Per HPI- 61 year old male, known to our unit from recent admission ( 11/13-11/16/18). Please refer to full initial psychiatric assessment dated 11/13.  Briefly, he was admitted to [facility] 11/3  due to worsening depression, neuro-vegetative symptoms of depression such as anhedonia,sadness, low energy, and recent suicidal ideations, with recent overdose on unknown " sleeping medication" on 11/3. He was improving gradually insofar as mood, affect and symptoms of depression. On 11/16 patient reported acute numbness on right arm. As he has a history of CVA in 2016 and history of CNS aneurysm, he was transported to ED and was briefly admitted under Neurology Service . He was worked up with CNS MRI, Echocardiogram, and Carotid US. Work up was negative, and it was not felt patient had a CVA. He was discharged back to [facility] for further psychiatric management on 11/18, and was readmitted . At this time patient reports feeling better than he had prior to admission- less depressed, feeling more optimistic, hopeful, looking forward to spending time with family and grandson after discharge, and minimizing current significant neuro-vegetative symptoms   Principal Problem: Severe major depression [facility]) Discharge Diagnoses: Patient Active Problem List   Diagnosis Date Noted  . Severe major depression ([place]) [F32.2] 04/14/2017  . Major depressive disorder, recurrent ([place]) [F33.9] 04/09/2017  . MDD (major depressive disorder), recurrent severe, without psychosis ([place]) [F33.2] 04/09/2017  . Family history of brain aneurysm [Z82.49]   . Intractable vomiting [R11.10] 10/20/2016   . History of intracranial aneurysm [Z86.79]   . Hypokalemia [E87.6]   . AKI (acute kidney injury) ([place]) [N17.9]   . Prolonged QT interval [R94.31]   . Lactic acidosis [E87.2]   . Dysphagia [R13.10]   . Esophageal ring [K22.2]   . Gastroesophageal reflux disease with esophagitis [K21.0]   . Nausea and vomiting [R11.2] 10/01/2016  . 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 Severe episode of recurrent major depressive disorder, without psychotic features ([place]) [F33.2] 07/20/2016  . CVA (cerebral vascular accident) ([place]) [I63.9] 06/15/2016  . Abdominal pain [R10.9]   . Numbness [R20.0]   . Carpal tunnel syndrome, bilateral [G56.03] 12/16/2015  . Status post stroke [Z86.73] 11/12/2015  . Transient neurologic deficit [R29.818] 10/15/2015  . Cerebrovascular accident, late effects [I69.90] 10/07/2015  . Complicated migraine [E72.094]   . Anxiety state [F41.1]   . 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 Cerebral infarction due to embolism of right middle cerebral artery ([place]) [I63.411] 09/23/2015  . Cerebral infarction due to embolism of cerebral artery ([place]) [I63.40] 09/23/2015  . Gait disturbance, post-stroke [I69.398, R26.9]   . Drooping of mouth [R29.810]   . Cerebrovascular accident (CVA) due to embolism of right anterior cerebral artery ([place]) [I63.421]   . Benign essential HTN [I10]   . Migraine with aura and without status migrainosus, not intractable [G43.109]   . Tachypnea [R06.82]   . Prediabetes [R73.03]   . Acute blood loss anemia [D62]   . Brain aneurysm [I67.1] 09/20/2015  . Type 2 diabetes mellitus ([place]) [E11.9] 09/15/2015  . Leukocytosis [D72.829] 09/15/2015  . Aneurysm, cerebral, nonruptured [I67.1] 09/15/2015  . TIA (transient ischemic attack) [G45.9] 09/14/2015  . Temporary cerebral vascular dysfunction [  G93.9] 09/14/2015  . H/O transient cerebral ischemia [Z86.73] 07/29/2015  . At risk for falling [Z91.81] 07/29/2015  . Bilateral hearing loss [H91.93]  10/25/2014  . Abnormal fear [F40.9] 08/08/2012  . Headache, migraine [G43.909] 08/08/2012  . Arthritis, degenerative [M19.90] 08/08/2012  . History of tear of ACL (anterior cruciate ligament) [Z87.828] 03/08/2011  . History of knee problem [Z87.39] 03/08/2011  . Depression [F32.9] 09/07/2010  . Depressive disorder, not elsewhere classified [F32.9] 09/07/2010  . URI (upper respiratory infection) [J06.9] 08/24/2010  . Hearing loss [H91.90] 09/06/2006  . Hyperlipidemia [E78.5] 04/23/2006  . Essential hypertension [I10] 04/23/2006  . Psychophysiological insomnia [F51.04] 04/23/2006    Past Psychiatric History:   Past Medical History:  Past Medical History:  Diagnosis Date  . Allergy   . Anxiety   . Cataract   . Cerebral hemorrhage ([place])   . Chest pain, atypical    12/2003:  negative cardiolyte  . Depression   . Drug abuse ([place])   . Eczema   . Erectile dysfunction   . History of meniscal tear    bilateral  . HLD (hyperlipidemia)   . HTN (hypertension)   . Hx of tear of ACL (anterior cruciate ligament)    right  . Insomnia   . Olecranon bursitis of left elbow 10/2009   s/p I&D by Dr Maxie Better, initially assessed by Dr. Nori Riis   . Prediabetes   . Stroke ([place])   . Suicidal behavior 04/13/2017   Pt states taking a bunch of sleeping pills to end life   . TIA (transient ischemic attack)     Past Surgical History:  Procedure Laterality Date  . EMBOLIZATION         (RADIOLOGY WITH ANESTHESIA) N/A 09/20/2015   Performed by Luanne Bras, MD at [facility]  . ESOPHAGOGASTRODUODENOSCOPY (EGD) N/A 10/03/2016   Performed by Irene Shipper, MD at [place]  . INCISE AND DRAIN ABCESS     L elbow due to cellulitis/bursitis  . INNER EAR SURGERY    . IR GENERIC HISTORICAL  12/28/2015   IR ANGIO VERTEBRAL SEL VERTEBRAL UNI L MOD SED 12/28/2015 Luanne Bras, MD [facility]-INTERV RAD  . IR GENERIC HISTORICAL  12/28/2015   IR ANGIO VERTEBRAL SEL SUBCLAVIAN INNOMINATE UNI R MOD SED 12/28/2015 Luanne Bras, MD [facility]  . IR GENERIC HISTORICAL  12/28/2015   IR ANGIO INTRA EXTRACRAN SEL INTERNAL CAROTID BILAT MOD SED 12/28/2015 Luanne Bras, MD [facility]  . KNEE ARTHROSCOPY    . Loop Recorder Insertion N/A 12/30/2015   Performed by Thompson Grayer, MD at [place] [facility]  . SKIN TAG REMOVAL     11 removed  . TRANSESOPHAGEAL ECHOCARDIOGRAM (TEE) N/A 12/30/2015   Performed by Larey Dresser, MD at [facility] ENDOSCOPY   Family History:  Family History  Problem Relation Age of Onset  . Stroke Mother   . Hypertension Mother   . Aneurysm Mother 66       Died of brain aneursym  . Heart failure Father   . Emphysema Father   . Diabetes Mellitus II Sister   . Colon cancer Neg Hx   . Rectal cancer Neg Hx   . Stomach cancer Neg Hx    Family Psychiatric  History:  Social History:  Social History   Substance and Sexual Activity  Alcohol Use No  . Alcohol/week: 0.0 oz     Social History   Substance and Sexual Activity  Drug Use No    Social History  Socioeconomic History  . Marital status: Divorced    Spouse name: None  . Number of children: None  . Years of education: None  . Highest education level: None  Social Needs  . Financial resource strain: None  . Food insecurity - worry: None  . Food insecurity - inability: None  . Transportation needs - medical: None  . Transportation needs - non-medical: None  Occupational History  . None  Tobacco Use  . Smoking status: Never Smoker  . Smokeless tobacco: Never Used  Substance and Sexual Activity  . Alcohol use: No    Alcohol/week: 0.0 oz  . Drug use: No  . Sexual activity: No  Other Topics Concern  . None  Social History Narrative   Separated, has 2 daughters, works as a Forensic psychologist.  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  Pt was  treated discharged with the medications listed below under Medication List.  Medical problems were identified and treated as needed.  Home medications were restarted as appropriate.  Improvement was monitored by observation and [name] 's daily report of symptom reduction.  Emotional and mental status was monitored by daily self-inventory reports completed by [name] and clinical staff.         [name] was evaluated by the treatment team for stability and plans for continued recovery upon discharge. [name] 's motivation was an integral factor for scheduling further treatment. Employment, transportation, bed availability, health status, family support, and any pending legal issues were also considered during hospital stay. Pt was offered further treatment options upon discharge including but not limited to Residential, Intensive Outpatient, and Outpatient treatment.  [name] will follow up with the services as listed below under Follow Up Information.     Upon completion of this admission the patient was both mentally and medically stable for discharge denying suicidal/homicidal ideation, auditory/visual/tactile hallucinations, delusional thoughts and paranoia.    [name] responded well to treatment with Neurontin 300 mg and Cymbalta without adverse effects. Pt demonstrated improvement without reported or observed adverse effects to the point of stability appropriate for outpatient management. Pertinent labs include:  for which outpatient follow-up is necessary for lab recheck as mentioned below. Reviewed CBC, CMP, BAL, and UDS; all unremarkable aside from noted exceptions.   Physical Findings: AIMS: Facial and Oral Movements Muscles of Facial Expression: None, normal Lips and Perioral Area: None, normal Jaw: None, normal Tongue: None, normal,Extremity Movements Upper (arms, wrists, hands, fingers): None, normal Lower (legs, knees, ankles, toes): None, normal,  Trunk Movements Neck, shoulders, hips: None, normal, Overall Severity Severity of abnormal movements (highest score from questions above): None, normal Incapacitation due to abnormal movements: None, normal Patient's awareness of abnormal movements (rate only patient's report): No Awareness, Dental Status Current problems with teeth and/or dentures?: No Does patient usually wear dentures?: No  CIWA:    COWS:     Musculoskeletal: Strength & Muscle Tone: within normal limits Gait & Station: normal Patient leans: N/A  Psychiatric Specialty Exam: See SRA by MD Physical Exam  Nursing note and vitals reviewed. Constitutional: He is oriented to person, place, and time. He appears well-developed.  Cardiovascular: Normal rate.  Neurological: He is alert and oriented to person, place, and time.  Psychiatric: He has a normal mood and  affect. His behavior is normal.    Review of Systems  Psychiatric/Behavioral: Positive for depression (stable). Negative for suicidal ideas. The patient is not nervous/anxious.     Blood pressure 130/71, pulse (!) 103, temperature 97.6 F (36.4 C), temperature source Oral, resp. rate 18, height 5\' 8"  (1.727 m), weight 114.3 kg (252 lb).Body mass index is 38.32 kg/m.   Have you used any form of tobacco in the last 30 days? (Cigarettes, Smokeless Tobacco, Cigars, and/or Pipes): No  Has this patient used any form of tobacco in the last 30 days? (Cigarettes, Smokeless Tobacco, Cigars, and/or Pipes) , No  Blood Alcohol level:  Lab Results  Component Value Date   ETH <10 04/08/2017   ETH <10 25/09/3974    Metabolic Disorder Labs:  Lab Results  Component Value Date   HGBA1C 6.2 (H) 04/14/2017   MPG 131.24 04/14/2017   MPG 183 06/16/2016   No results found for: PROLACTIN Lab Results  Component Value Date   CHOL 124 04/14/2017   TRIG 107 04/14/2017   HDL 29 (L) 04/14/2017   CHOLHDL 4.3 04/14/2017   VLDL 21 04/14/2017   LDLCALC 74 04/14/2017   [place]  94 06/16/2016    See Psychiatric Specialty Exam and Suicide Risk Assessment completed by Attending Physician prior to discharge.  Discharge destination:  Home  Is patient on multiple antipsychotic therapies at discharge:  No   Has Patient had three or more failed trials of antipsychotic monotherapy by history:  No  Recommended Plan for Multiple Antipsychotic Therapies: NA  Discharge Instructions    Diet - low sodium heart healthy   Complete by:  As directed    Discharge instructions   Complete by:  As directed    Take all medications as prescribed. Keep all follow-up appointments as scheduled.  Do not consume alcohol or use illegal drugs while on prescription medications. Report any adverse effects from your medications to your primary care provider promptly.  In the event of recurrent symptoms or worsening symptoms, call 911, a crisis hotline, or go to the nearest emergency department for evaluation.   Increase activity slowly   Complete by:  As directed      Allergies as of 04/16/2017      Reactions   Metformin Diarrhea      Medication List    STOP taking these medications   aspirin 81 MG EC tablet     TAKE these medications     Indication  amLODipine 10 MG tablet Commonly known as:  NORVASC Take 1 tablet (10 mg total) by mouth daily.  Indication:  High Blood Pressure Disorder   atorvastatin 80 MG tablet Commonly known as:  LIPITOR Take 1 tablet (80 mg total) daily at 6 PM by mouth.  Indication:  High Amount of Fats in the Blood   chlorthalidone 25 MG tablet Commonly known as:  HYGROTON Take 25 mg daily by mouth.  Indication:  High Blood Pressure Disorder   DULoxetine HCl 40 MG Cpep Take 40 mg daily by mouth. Start taking on:  04/17/2017  Indication:  Generalized Anxiety Disorder   gabapentin 300 MG capsule Commonly known as:  NEURONTIN Take 1 capsule (300 mg total) at bedtime by mouth.  Indication:  Agitation   glipiZIDE 10 MG 24 hr tablet Commonly  known as:  GLUCOTROL XL Take 10 mg daily with breakfast by mouth.  Indication:  Type 2 Diabetes   hydrOXYzine 25 MG tablet Commonly known as:  ATARAX/VISTARIL Take 1 tablet (25 mg  total) at bedtime and may repeat dose one time if needed by mouth. What changed:    when to take this  reasons to take this  Indication:  Feeling Anxious   lisinopril 40 MG tablet Commonly known as:  PRINIVIL,ZESTRIL Take 40 mg by mouth daily.  Indication:  High Blood Pressure Disorder   pantoprazole 40 MG tablet Commonly known as:  PROTONIX Take 1 tablet (40 mg total) by mouth daily.  Indication:  Gastroesophageal Reflux Disease   traZODone 50 MG tablet Commonly known as:  DESYREL Take 1 tablet (50 mg total) at bedtime as needed by mouth for sleep.  Indication:  [place] [facility] Follow up on 04/26/2017.   Specialty:  Behavioral Health Why:  Assessment appointment for the Intensive Outpatient Program 11/29 at 8:45am. Please call the office if you need to cancel or reschedule your appointment. Thank you. Contact information: [place] [phone] [place] [phone]          Follow-up recommendations:  Activity:  as tolerated Diet:  as tolerated  Comments: Take all medications as prescribed. Keep all follow-up appointments as scheduled.  Do not consume alcohol or use illegal drugs while on prescription medications. Report any adverse effects from your medications to your primary care provider promptly.  In the event of recurrent symptoms or worsening symptoms, call 911, a crisis hotline, or go to the nearest emergency department for evaluation.   Signed: Derrill Center, NP 04/16/2017, 12:27 PM    Patient seen, Suicide Assessment Completed.  Disposition Plan Reviewed
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
## 2017-04-16 NOTE — Progress Notes (Signed)
Nutrition Brief Note  Patient identified on the Malnutrition Screening Tool (MST) Report  Patient's weight is consistent with what he weighed in 2017. Noted overall weight gain since April 2017.  Wt Readings from Last 15 Encounters:  04/14/17 252 lb (114.3 kg)  04/13/17 272 lb (123.4 kg)  04/13/17 265 lb 2 oz (120.3 kg)  10/20/16 261 lb 14.5 oz (118.8 kg)  10/05/16 264 lb 9.6 oz (120 kg)  09/06/16 267 lb (121.1 kg)  07/20/16 266 lb 9.6 oz (120.9 kg)  06/15/16 270 lb 4.5 oz (122.6 kg)  03/23/16 260 lb (117.9 kg)  01/05/16 250 lb (113.4 kg)  12/25/15 253 lb 6.4 oz (114.9 kg)  12/16/15 256 lb (116.1 kg)  10/15/15 252 lb (114.3 kg)  09/23/15 241 lb (109.3 kg)  09/20/15 246 lb 4 oz (111.7 kg)    Body mass index is 38.32 kg/m. Patient meets criteria for obesity based on current BMI.  Labs and medications reviewed.   No nutrition interventions warranted at this time. If nutrition issues arise, please consult RD.   Clayton Bibles, MS, RD, [place] Dietitian Pager: [phone] After Hours Pager: [phone]

## 2017-04-17 ENCOUNTER — Telehealth: Payer: Self-pay | Admitting: Cardiology

## 2017-04-17 NOTE — Telephone Encounter (Signed)
Spoke w/ pt and requested that he send a manual transmission b/c his home monitor has not updated in at least 14 days.   

## 2017-04-23 ENCOUNTER — Ambulatory Visit (INDEPENDENT_AMBULATORY_CARE_PROVIDER_SITE_OTHER): Payer: Self-pay | Admitting: *Deleted

## 2017-04-23 DIAGNOSIS — I639 Cerebral infarction, unspecified: Secondary | ICD-10-CM

## 2017-04-24 NOTE — Progress Notes (Signed)
Carelink Summary Report / Loop Recorder 

## 2017-04-29 NOTE — Discharge Summary (Signed)
Patient is a 60 year old male, who was admitted to inpatient unit for worsening depression, suicidal ideations, recent overdose on unknown " sleeping medication".  Of note, medical history remarkable for history of TIA, Brain Aneurysm. On 11/16 patient reported improved mood, presented with improved range of affect, denied SI. He reported new onset numbness on R arm . Although there was no dysarthria, lateralization or facial droop, there was concern for possible TIA or CVA based on history. Based on history as above, he was transported via EMS to ED for appropriate management.  F [place]os,MD

## 2017-05-04 ENCOUNTER — Other Ambulatory Visit: Payer: Self-pay

## 2017-05-04 ENCOUNTER — Encounter (HOSPITAL_COMMUNITY): Payer: Self-pay | Admitting: Psychiatry

## 2017-05-04 ENCOUNTER — Ambulatory Visit (INDEPENDENT_AMBULATORY_CARE_PROVIDER_SITE_OTHER): Payer: Medicaid Other | Admitting: Psychiatry

## 2017-05-04 VITALS — BP 133/85 | HR 119 | Temp 98.3°F

## 2017-05-04 DIAGNOSIS — F332 Major depressive disorder, recurrent severe without psychotic features: Secondary | ICD-10-CM | POA: Diagnosis not present

## 2017-05-04 DIAGNOSIS — F482 Pseudobulbar affect: Secondary | ICD-10-CM

## 2017-05-04 DIAGNOSIS — F5104 Psychophysiologic insomnia: Secondary | ICD-10-CM

## 2017-05-04 MED ORDER — DULOXETINE HCL 40 MG PO CPEP: 80.0000 mg | ORAL_CAPSULE | Freq: Every day | ORAL | 4 refills | Status: DC

## 2017-05-04 MED ORDER — TRAZODONE HCL 100 MG PO TABS: 200.0000 mg | ORAL_TABLET | Freq: Every evening | ORAL | 4 refills | Status: DC | PRN

## 2017-05-04 MED ORDER — GABAPENTIN 300 MG PO CAPS: 300.0000 mg | ORAL_CAPSULE | Freq: Two times a day (BID) | ORAL | 4 refills | Status: DC

## 2017-05-04 NOTE — Progress Notes (Signed)
[facility] MD/PA/NP OP Progress Note  05/04/2017 11:02 AM [name]  MRN:  [id number]  Chief Complaint: hospital follow-up  HPI: [name] was last seen by this writer in April, 8 months ago.  He was recently psychiatrically hospitalized at behavioral health and discharged on November 19.  He overdosed in an attempt to end his life, family sent police to retrieve him. he was briefly medically transferred for concern of CVA, which was ultimately determined to be a transient ischemic attack.  Spent time with patient reviewing some of the recent stressors, loss of insurance, ongoing decline in his cerebrovascular function, memory issues.  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  He reports that he realizes his health and physical body are declining, and he wants to focus on being mentally more well.  We reviewed the recent suicide attempt where he overdosed on Zyprexa.  He reports that he took about 5-7 or more pills.  He denies any intention to harm himself currently, and reports that he is apologetic towards his family and grateful to be alive.  We discussed his current medication regimen, Cymbalta 40 mg, trazodone 50 mg 2 at night, and gabapentin 300 mg nightly.  He continues to struggle with insomnia, reports the trazodone helps when he takes 2 tablets.  He reports that his mood is still dysphoric and anxious at times.  We discussed increasing Cymbalta to 80 mg in the morning, increasing gabapentin to 300 mg twice daily, and increasing trazodone to 200 mg nightly for sleep.  He was agreeable with these changes and we reviewed the risks and benefits including common side effects.  His sister will help him to organize his pills in a pill dispenser.  He agrees to follow-up with Probation officer in 6 weeks and we reviewed emergency resources if needed including [facility], behavioral health, or to call the emergency crisis line or police.  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  He continues to have difficulty and feeling abandoned, lonely, isolated and has thoughts that nobody loves him whenever his family is unable to visit him certain weekends.  Visit Diagnosis:    ICD-10-CM   1. Severe episode of recurrent major depressive disorder, without psychotic features ([place]) F33.2 traZODone (DESYREL) 100 MG tablet    DULoxetine HCl 40 MG CPEP    gabapentin (NEURONTIN) 300 MG capsule  2. 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 Psychophysiological insomnia F51.04 traZODone (DESYREL) 100 MG tablet    DULoxetine HCl 40 MG CPEP    gabapentin (NEURONTIN) 300 MG capsule    Past Psychiatric History: 2 psychiatric admissions November 2018 at [facility]  Past Medical History:  Past Medical History:  Diagnosis Date  . Allergy   . Anxiety   . Cataract   . Cerebral hemorrhage ([place])   . Chest pain, atypical    12/2003:  negative cardiolyte  . Depression   . Drug abuse ([place])   . Eczema   . Erectile dysfunction   . History of meniscal tear    bilateral  . HLD (hyperlipidemia)   . HTN (hypertension)   . Hx of tear of ACL (anterior cruciate ligament)    right  . Insomnia   . Olecranon  bursitis of left elbow 10/2009   s/p I&D by Dr Maxie Better, initially assessed by Dr. Nori Riis   . Prediabetes   . Stroke ([place])   . Suicidal behavior 04/13/2017   Pt states taking a bunch of sleeping pills to end life   . TIA (transient ischemic attack)     Past Surgical History:  Procedure Laterality Date  . EP IMPLANTABLE DEVICE N/A 12/30/2015   Procedure: Loop Recorder Insertion;  Surgeon: Thompson Grayer, MD;  Location: [place] CV LAB;  Service: Cardiovascular;  Laterality: N/A;  . ESOPHAGOGASTRODUODENOSCOPY N/A 10/03/2016    Procedure: ESOPHAGOGASTRODUODENOSCOPY (EGD);  Surgeon: Irene Shipper, MD;  Location: [facility] ENDOSCOPY;  Service: Endoscopy;  Laterality: N/A;  . INCISE AND DRAIN ABCESS     L elbow due to cellulitis/bursitis  . INNER EAR SURGERY    . IR GENERIC HISTORICAL  12/28/2015   IR ANGIO VERTEBRAL SEL VERTEBRAL UNI L MOD SED 12/28/2015 Luanne Bras, MD [facility]INTER[facility]  . IR GENERIC HISTORICAL  12/28/2015   IR ANGIO VERTEBRAL SEL SUBCLAVIAN INNOMINATE UNI R MOD SED 12/28/2015 Luanne Bras, MD [facility]  . IR GENERIC HISTORICAL  12/28/2015   IR ANGIO INTRA EXTRACRAN SEL INTERNAL CAROTID BILAT MOD SED 12/28/2015 Luanne Bras, MD [facility]  . KNEE ARTHROSCOPY    . RADIOLOGY WITH ANESTHESIA N/A 09/20/2015   Procedure: EMBOLIZATION         (RADIOLOGY WITH ANESTHESIA);  Surgeon: Luanne Bras, MD;  Location: [place];  Service: Radiology;  Laterality: N/A;  . SKIN TAG REMOVAL     11 removed  . TEE WITHOUT CARDIOVERSION N/A 12/30/2015   Procedure: TRANSESOPHAGEAL ECHOCARDIOGRAM (TEE);  Surgeon: Larey Dresser, MD;  Location: [facility] ENDOSCOPY;  Service: Cardiovascular;  Laterality: N/A;    Family Psychiatric History: See intake H&P for full details. Reviewed, with no updates at this time.   Family History:  Family History  Problem Relation Age of Onset  . Stroke Mother   . Hypertension Mother   . Aneurysm Mother 49       Died of brain aneursym  . Heart failure Father   . Emphysema Father   . Diabetes Mellitus II Sister   . Colon cancer Neg Hx   . Rectal cancer Neg Hx   . Stomach cancer Neg Hx     Social History:  Social History   Socioeconomic History  . Marital status: Divorced    Spouse name: None  . Number of children: 2  . Years of education: None  . Highest education level: None  Social Needs  . Financial resource strain: Somewhat hard  . Food insecurity - worry: Sometimes true  . Food insecurity - inability: Sometimes true  . Transportation needs - medical: Yes  . Transportation  needs - non-medical: Yes  Occupational History  . None  Tobacco Use  . Smoking status: Never Smoker  . Smokeless tobacco: Never Used  Substance and Sexual Activity  . Alcohol use: No    Alcohol/week: 0.0 oz  . Drug use: No  . Sexual activity: No  Other Topics Concern  . None  Social History Narrative   Separated, has 2 daughters, works as a Forensic psychologist.  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 Metformin Diarrhea    Metabolic Disorder Labs: Lab Results  Component Value Date   HGBA1C 6.2 (H) 04/14/2017   MPG 131.24 04/14/2017  MPG 183 06/16/2016   No results found for: PROLACTIN Lab Results  Component Value Date   CHOL 124 04/14/2017   TRIG 107 04/14/2017   HDL 29 (L) 04/14/2017   CHOLHDL 4.3 04/14/2017   VLDL 21 04/14/2017   LDLCALC 74 04/14/2017   LDLCALC 94 06/16/2016   Lab Results  Component Value Date   TSH 0.712 04/11/2017   TSH 0.374 10/01/2016    Therapeutic Level Labs: No results found for: LITHIUM Lab Results  Component Value Date   VALPROATE <10 (L) 12/28/2015   VALPROATE 77 09/27/2015   No components found for:  CBMZ  Current Medications: Current Outpatient Medications  Medication Sig Dispense Refill  . amLODipine (NORVASC) 10 MG tablet Take 1 tablet (10 mg total) by mouth daily. 30 tablet 0  . atorvastatin (LIPITOR) 80 MG tablet Take 1 tablet (80 mg total) daily at 6 PM by mouth. 30 tablet 0  . chlorthalidone (HYGROTON) 25 MG tablet Take 25 mg daily by mouth.     . DULoxetine HCl 40 MG CPEP Take 80 mg by mouth daily. 60 capsule 4  . gabapentin (NEURONTIN) 300 MG capsule Take 1 capsule (300 mg total) by mouth 2 (two) times daily. 60 capsule 4  . glipiZIDE (GLUCOTROL XL) 10 MG 24 hr tablet Take 10 mg daily with breakfast by mouth.    Marland Kitchen lisinopril (PRINIVIL,ZESTRIL) 40 MG tablet Take 40 mg by mouth daily.    . pantoprazole  (PROTONIX) 40 MG tablet Take 1 tablet (40 mg total) by mouth daily. 30 tablet 0  . traZODone (DESYREL) 100 MG tablet Take 2 tablets (200 mg total) by mouth at bedtime as needed for sleep. 60 tablet 4   No current facility-administered medications for this visit.      Musculoskeletal: Strength & Muscle Tone: abnormal Gait & Station: normal Patient leans: N/A  Psychiatric Specialty Exam: ROS  Blood pressure 133/85, pulse (!) 119, temperature 98.3 F (36.8 C), temperature source Oral.There is no height or weight on file to calculate BMI.  General Appearance: Casual and Fairly Groomed  Eye Contact:  Fair  Speech:  Clear and Coherent  Volume:  Normal  Mood:  Anxious and Dysphoric  Affect:  Appropriate and Congruent  Thought Process:  Goal Directed and Descriptions of Associations: Intact  Orientation:  Full (Time, Place, and Person)  Thought Content: Logical   Suicidal Thoughts:  No  Homicidal Thoughts:  No  Memory:  Immediate;   Fair  Judgement:  Fair  Insight:  Shallow  Psychomotor Activity:  Normal  Concentration:  Concentration: Fair  Recall:  [place] of Knowledge: Fair  Language: Fair  Akathisia:  Negative  Handed:  Right  AIMS (if indicated): not done  Assets:  Communication Skills Desire for Improvement Housing Social Support  ADL's:  Intact  Cognition: WNL  Sleep:  Fair   Screenings: AIMS     Admission (Discharged) from 04/14/2017 in [place] [facility]  AIMS Total Score  0    AUDIT     Admission (Discharged) from 04/14/2017 in [place] [facility]  Alcohol Use Disorder Identification Test Final Score (AUDIT)  0    PHQ2-9     [facility] 03/08/2011 in [place] Office Visit from 08/24/2010 in [place]  PHQ-2 Total Score   0  3  0  0  PHQ-9 Total Score  No data  10  No data  No data       Assessment and Plan:  [name] presents with ongoing depression and anxiety symptoms, continued difficulty with sleep.  We reviewed the recent psychiatric hospitalization, suicide attempt by overdose, and the ensuing medication changes at behavioral health Hospital.  He had not seen this writer for nearly 7 months due to losing his insurance, and not being able to afford coming to visits.  He is regretful of his actions and suicide attempt, and is grateful to be alive.  He denies any intentions to harm himself currently, but does continue to struggle with periods of severe dysphoria and depression, and hopelessness about his physical health.  We agreed to proceed as below and follow-up in 6 weeks.  He continues to be limited in how often he can be seen due to unable to drive, and requires family support to get around.  1. Severe episode of recurrent major depressive disorder, without psychotic features ([place])   2. Pseudobulbar affect   3. Psychophysiological insomnia     Status of current problems: stable  Labs Ordered: No orders of the defined types were placed in this encounter.   Labs Reviewed: n/a  Collateral Obtained/Records Reviewed: Reviewed recent psychiatric hospitalization, ER presentation, and transient ischemic attack at [facility] behavioral health and [place]:  Increase Cymbalta to 80 mg daily Increase trazodone to 200 mg nightly Increase gabapentin to 300 mg twice daily Reviewed safety resources including behavioral health walk-in clinic, emergency department, crisis hotline Return to clinic in 6 weeks  I spent 30 minutes with the patient in direct face-to-face clinical care.  Greater than 50% of this time was spent in counseling and coordination of care with the patient.    Aundra Dubin, MD 05/04/2017, 11:02 AM

## 2017-05-08 LAB — CUP PACEART REMOTE DEVICE CHECK
Implantable Pulse Generator Implant Date: 20170803
MDC IDC SESS DTM: 20181127024026

## 2017-05-14 ENCOUNTER — Other Ambulatory Visit (HOSPITAL_COMMUNITY): Payer: Self-pay

## 2017-05-14 DIAGNOSIS — F332 Major depressive disorder, recurrent severe without psychotic features: Secondary | ICD-10-CM

## 2017-05-14 DIAGNOSIS — F5104 Psychophysiologic insomnia: Secondary | ICD-10-CM

## 2017-05-14 DIAGNOSIS — F482 Pseudobulbar affect: Secondary | ICD-10-CM

## 2017-05-14 MED ORDER — DULOXETINE HCL 60 MG PO CPEP: 60.0000 mg | ORAL_CAPSULE | Freq: Every day | ORAL | 1 refills | Status: DC

## 2017-05-14 MED ORDER — DULOXETINE HCL 20 MG PO CPEP: 20.0000 mg | ORAL_CAPSULE | Freq: Every day | ORAL | 1 refills | Status: DC

## 2017-05-23 ENCOUNTER — Ambulatory Visit (INDEPENDENT_AMBULATORY_CARE_PROVIDER_SITE_OTHER): Payer: Medicaid Other | Admitting: *Deleted

## 2017-05-23 DIAGNOSIS — I639 Cerebral infarction, unspecified: Secondary | ICD-10-CM

## 2017-05-24 NOTE — Progress Notes (Signed)
Carelink Summary Report / Loop Recorder 

## 2017-05-28 ENCOUNTER — Other Ambulatory Visit: Payer: Self-pay | Admitting: Internal Medicine

## 2017-05-31 ENCOUNTER — Telehealth: Payer: Self-pay | Admitting: Cardiology

## 2017-05-31 NOTE — Telephone Encounter (Signed)
Spoke w/ pt and requested that he send a manual transmission b/c his home monitor has not updated in at least 14 days.   

## 2017-06-03 LAB — CUP PACEART REMOTE DEVICE CHECK
Implantable Pulse Generator Implant Date: 20170803
MDC IDC SESS DTM: 20181227030907

## 2017-06-05 ENCOUNTER — Encounter: Payer: Self-pay | Admitting: Cardiology

## 2017-06-09 ENCOUNTER — Other Ambulatory Visit: Payer: Self-pay | Admitting: Internal Medicine

## 2017-06-20 ENCOUNTER — Encounter: Payer: Self-pay | Admitting: Cardiology

## 2017-06-22 ENCOUNTER — Ambulatory Visit (HOSPITAL_COMMUNITY): Payer: Self-pay | Admitting: Psychiatry

## 2017-06-22 ENCOUNTER — Encounter: Payer: Medicaid Other | Admitting: *Deleted

## 2017-06-27 ENCOUNTER — Encounter: Payer: Self-pay | Admitting: Cardiology

## 2017-08-13 ENCOUNTER — Telehealth: Payer: Self-pay

## 2017-08-13 NOTE — Telephone Encounter (Signed)
Sent referral to scheduling, attached notes to [facility] file.  

## 2017-08-21 ENCOUNTER — Ambulatory Visit (HOSPITAL_COMMUNITY): Payer: Medicaid Other | Admitting: Psychiatry

## 2018-01-18 ENCOUNTER — Telehealth (HOSPITAL_COMMUNITY): Payer: Self-pay

## 2018-01-18 NOTE — Telephone Encounter (Signed)
Pharmacy sent in a refill request for Duloxetine 60mg  capsule. No future appt is scheduled. Please advise

## 2018-01-22 NOTE — Telephone Encounter (Signed)
Defer to pcp for ongoing management, patient is no longer under my care. Has not been seen by this office in 9 months

## 2018-02-23 ENCOUNTER — Ambulatory Visit (HOSPITAL_COMMUNITY): Payer: Medicaid Other | Admitting: Psychiatry

## 2018-09-02 DIAGNOSIS — I629 Nontraumatic intracranial hemorrhage, unspecified: Secondary | ICD-10-CM

## 2018-09-02 DIAGNOSIS — J15212 Pneumonia due to Methicillin resistant Staphylococcus aureus: Secondary | ICD-10-CM

## 2018-09-02 DIAGNOSIS — G9381 Temporal sclerosis: Secondary | ICD-10-CM

## 2018-09-02 DIAGNOSIS — J9621 Acute and chronic respiratory failure with hypoxia: Secondary | ICD-10-CM

## 2018-09-03 DIAGNOSIS — J15212 Pneumonia due to Methicillin resistant Staphylococcus aureus: Secondary | ICD-10-CM

## 2018-09-03 DIAGNOSIS — I629 Nontraumatic intracranial hemorrhage, unspecified: Secondary | ICD-10-CM | POA: Diagnosis not present

## 2018-09-03 DIAGNOSIS — G9381 Temporal sclerosis: Secondary | ICD-10-CM | POA: Diagnosis not present

## 2018-09-03 DIAGNOSIS — J9621 Acute and chronic respiratory failure with hypoxia: Secondary | ICD-10-CM

## 2018-09-04 DIAGNOSIS — I629 Nontraumatic intracranial hemorrhage, unspecified: Secondary | ICD-10-CM

## 2018-09-04 DIAGNOSIS — G9381 Temporal sclerosis: Secondary | ICD-10-CM

## 2018-09-04 DIAGNOSIS — J15212 Pneumonia due to Methicillin resistant Staphylococcus aureus: Secondary | ICD-10-CM

## 2018-09-04 DIAGNOSIS — J9621 Acute and chronic respiratory failure with hypoxia: Secondary | ICD-10-CM

## 2018-09-05 DIAGNOSIS — J15212 Pneumonia due to Methicillin resistant Staphylococcus aureus: Secondary | ICD-10-CM

## 2018-09-05 DIAGNOSIS — G9381 Temporal sclerosis: Secondary | ICD-10-CM

## 2018-09-05 DIAGNOSIS — I629 Nontraumatic intracranial hemorrhage, unspecified: Secondary | ICD-10-CM

## 2018-09-05 DIAGNOSIS — J9621 Acute and chronic respiratory failure with hypoxia: Secondary | ICD-10-CM | POA: Diagnosis not present

## 2018-09-06 DIAGNOSIS — I629 Nontraumatic intracranial hemorrhage, unspecified: Secondary | ICD-10-CM

## 2018-09-06 DIAGNOSIS — G9381 Temporal sclerosis: Secondary | ICD-10-CM

## 2018-09-06 DIAGNOSIS — J15212 Pneumonia due to Methicillin resistant Staphylococcus aureus: Secondary | ICD-10-CM

## 2018-09-06 DIAGNOSIS — J9621 Acute and chronic respiratory failure with hypoxia: Secondary | ICD-10-CM | POA: Diagnosis not present

## 2018-09-07 DIAGNOSIS — I629 Nontraumatic intracranial hemorrhage, unspecified: Secondary | ICD-10-CM

## 2018-09-07 DIAGNOSIS — G9381 Temporal sclerosis: Secondary | ICD-10-CM

## 2018-09-07 DIAGNOSIS — J9621 Acute and chronic respiratory failure with hypoxia: Secondary | ICD-10-CM | POA: Diagnosis not present

## 2018-09-07 DIAGNOSIS — J15212 Pneumonia due to Methicillin resistant Staphylococcus aureus: Secondary | ICD-10-CM

## 2018-09-08 DIAGNOSIS — J9621 Acute and chronic respiratory failure with hypoxia: Secondary | ICD-10-CM

## 2018-09-08 DIAGNOSIS — J15212 Pneumonia due to Methicillin resistant Staphylococcus aureus: Secondary | ICD-10-CM

## 2018-09-08 DIAGNOSIS — I629 Nontraumatic intracranial hemorrhage, unspecified: Secondary | ICD-10-CM

## 2018-09-08 DIAGNOSIS — G9381 Temporal sclerosis: Secondary | ICD-10-CM

## 2018-10-19 ENCOUNTER — Emergency Department: Payer: Medicare HMO

## 2018-10-19 ENCOUNTER — Other Ambulatory Visit: Payer: Self-pay

## 2018-10-19 ENCOUNTER — Inpatient Hospital Stay
Admission: EM | Admit: 2018-10-19 | Discharge: 2018-11-28 | DRG: 870 | Disposition: A | Discharge status: Skilled Nursing Facility | Payer: Medicare HMO | Attending: Internal Medicine | Admitting: Internal Medicine

## 2018-10-19 ENCOUNTER — Inpatient Hospital Stay: Payer: Medicare HMO

## 2018-10-19 DIAGNOSIS — R0600 Dyspnea, unspecified: Secondary | ICD-10-CM

## 2018-10-19 DIAGNOSIS — A419 Sepsis, unspecified organism: Secondary | ICD-10-CM | POA: Diagnosis not present

## 2018-10-19 DIAGNOSIS — N401 Enlarged prostate with lower urinary tract symptoms: Secondary | ICD-10-CM | POA: Diagnosis present

## 2018-10-19 DIAGNOSIS — E1121 Type 2 diabetes mellitus with diabetic nephropathy: Secondary | ICD-10-CM | POA: Diagnosis present

## 2018-10-19 DIAGNOSIS — K3189 Other diseases of stomach and duodenum: Secondary | ICD-10-CM | POA: Diagnosis not present

## 2018-10-19 DIAGNOSIS — E875 Hyperkalemia: Secondary | ICD-10-CM | POA: Diagnosis present

## 2018-10-19 DIAGNOSIS — Z833 Family history of diabetes mellitus: Secondary | ICD-10-CM

## 2018-10-19 DIAGNOSIS — L89152 Pressure ulcer of sacral region, stage 2: Secondary | ICD-10-CM | POA: Diagnosis not present

## 2018-10-19 DIAGNOSIS — N320 Bladder-neck obstruction: Secondary | ICD-10-CM | POA: Diagnosis present

## 2018-10-19 DIAGNOSIS — A4189 Other specified sepsis: Secondary | ICD-10-CM | POA: Diagnosis present

## 2018-10-19 DIAGNOSIS — G9389 Other specified disorders of brain: Secondary | ICD-10-CM | POA: Diagnosis present

## 2018-10-19 DIAGNOSIS — B964 Proteus (mirabilis) (morganii) as the cause of diseases classified elsewhere: Secondary | ICD-10-CM | POA: Diagnosis present

## 2018-10-19 DIAGNOSIS — R4701 Aphasia: Secondary | ICD-10-CM | POA: Diagnosis not present

## 2018-10-19 DIAGNOSIS — R4182 Altered mental status, unspecified: Secondary | ICD-10-CM

## 2018-10-19 DIAGNOSIS — E785 Hyperlipidemia, unspecified: Secondary | ICD-10-CM | POA: Diagnosis present

## 2018-10-19 DIAGNOSIS — N179 Acute kidney failure, unspecified: Secondary | ICD-10-CM

## 2018-10-19 DIAGNOSIS — R41841 Cognitive communication deficit: Secondary | ICD-10-CM | POA: Diagnosis not present

## 2018-10-19 DIAGNOSIS — J9600 Acute respiratory failure, unspecified whether with hypoxia or hypercapnia: Secondary | ICD-10-CM | POA: Diagnosis not present

## 2018-10-19 DIAGNOSIS — Z0189 Encounter for other specified special examinations: Secondary | ICD-10-CM

## 2018-10-19 DIAGNOSIS — Z888 Allergy status to other drugs, medicaments and biological substances status: Secondary | ICD-10-CM

## 2018-10-19 DIAGNOSIS — Z931 Gastrostomy status: Secondary | ICD-10-CM

## 2018-10-19 DIAGNOSIS — R1310 Dysphagia, unspecified: Secondary | ICD-10-CM | POA: Diagnosis not present

## 2018-10-19 DIAGNOSIS — R6521 Severe sepsis with septic shock: Secondary | ICD-10-CM | POA: Diagnosis present

## 2018-10-19 DIAGNOSIS — E870 Hyperosmolality and hypernatremia: Secondary | ICD-10-CM | POA: Diagnosis not present

## 2018-10-19 DIAGNOSIS — M6281 Muscle weakness (generalized): Secondary | ICD-10-CM | POA: Diagnosis not present

## 2018-10-19 DIAGNOSIS — E876 Hypokalemia: Secondary | ICD-10-CM | POA: Diagnosis not present

## 2018-10-19 DIAGNOSIS — Z9621 Cochlear implant status: Secondary | ICD-10-CM

## 2018-10-19 DIAGNOSIS — G40909 Epilepsy, unspecified, not intractable, without status epilepticus: Secondary | ICD-10-CM | POA: Diagnosis present

## 2018-10-19 DIAGNOSIS — Z8673 Personal history of transient ischemic attack (TIA), and cerebral infarction without residual deficits: Secondary | ICD-10-CM

## 2018-10-19 DIAGNOSIS — E11649 Type 2 diabetes mellitus with hypoglycemia without coma: Secondary | ICD-10-CM | POA: Diagnosis present

## 2018-10-19 DIAGNOSIS — K259 Gastric ulcer, unspecified as acute or chronic, without hemorrhage or perforation: Secondary | ICD-10-CM | POA: Diagnosis present

## 2018-10-19 DIAGNOSIS — R1312 Dysphagia, oropharyngeal phase: Secondary | ICD-10-CM | POA: Diagnosis not present

## 2018-10-19 DIAGNOSIS — R5381 Other malaise: Secondary | ICD-10-CM | POA: Diagnosis not present

## 2018-10-19 DIAGNOSIS — R338 Other retention of urine: Secondary | ICD-10-CM | POA: Diagnosis present

## 2018-10-19 DIAGNOSIS — N390 Urinary tract infection, site not specified: Secondary | ICD-10-CM | POA: Diagnosis not present

## 2018-10-19 DIAGNOSIS — Z993 Dependence on wheelchair: Secondary | ICD-10-CM

## 2018-10-19 DIAGNOSIS — I493 Ventricular premature depolarization: Secondary | ICD-10-CM | POA: Diagnosis present

## 2018-10-19 DIAGNOSIS — J9601 Acute respiratory failure with hypoxia: Secondary | ICD-10-CM

## 2018-10-19 DIAGNOSIS — Z515 Encounter for palliative care: Secondary | ICD-10-CM | POA: Diagnosis not present

## 2018-10-19 DIAGNOSIS — H9190 Unspecified hearing loss, unspecified ear: Secondary | ICD-10-CM | POA: Diagnosis present

## 2018-10-19 DIAGNOSIS — R339 Retention of urine, unspecified: Secondary | ICD-10-CM | POA: Diagnosis not present

## 2018-10-19 DIAGNOSIS — G839 Paralytic syndrome, unspecified: Secondary | ICD-10-CM | POA: Diagnosis not present

## 2018-10-19 DIAGNOSIS — G9340 Encephalopathy, unspecified: Secondary | ICD-10-CM

## 2018-10-19 DIAGNOSIS — I63421 Cerebral infarction due to embolism of right anterior cerebral artery: Secondary | ICD-10-CM | POA: Diagnosis not present

## 2018-10-19 DIAGNOSIS — I10 Essential (primary) hypertension: Secondary | ICD-10-CM | POA: Diagnosis present

## 2018-10-19 DIAGNOSIS — Z8679 Personal history of other diseases of the circulatory system: Secondary | ICD-10-CM

## 2018-10-19 DIAGNOSIS — R6520 Severe sepsis without septic shock: Secondary | ICD-10-CM | POA: Diagnosis not present

## 2018-10-19 DIAGNOSIS — Z823 Family history of stroke: Secondary | ICD-10-CM

## 2018-10-19 DIAGNOSIS — I619 Nontraumatic intracerebral hemorrhage, unspecified: Secondary | ICD-10-CM | POA: Diagnosis not present

## 2018-10-19 DIAGNOSIS — F329 Major depressive disorder, single episode, unspecified: Secondary | ICD-10-CM | POA: Diagnosis present

## 2018-10-19 DIAGNOSIS — I6389 Other cerebral infarction: Secondary | ICD-10-CM | POA: Diagnosis not present

## 2018-10-19 DIAGNOSIS — G92 Toxic encephalopathy: Secondary | ICD-10-CM | POA: Diagnosis present

## 2018-10-19 DIAGNOSIS — Z8249 Family history of ischemic heart disease and other diseases of the circulatory system: Secondary | ICD-10-CM

## 2018-10-19 DIAGNOSIS — Z431 Encounter for attention to gastrostomy: Secondary | ICD-10-CM | POA: Diagnosis not present

## 2018-10-19 DIAGNOSIS — Z20828 Contact with and (suspected) exposure to other viral communicable diseases: Secondary | ICD-10-CM | POA: Diagnosis present

## 2018-10-19 DIAGNOSIS — N138 Other obstructive and reflux uropathy: Secondary | ICD-10-CM | POA: Diagnosis present

## 2018-10-19 DIAGNOSIS — R414 Neurologic neglect syndrome: Secondary | ICD-10-CM | POA: Diagnosis present

## 2018-10-19 DIAGNOSIS — Z7401 Bed confinement status: Secondary | ICD-10-CM | POA: Diagnosis not present

## 2018-10-19 DIAGNOSIS — Z4659 Encounter for fitting and adjustment of other gastrointestinal appliance and device: Secondary | ICD-10-CM

## 2018-10-19 DIAGNOSIS — Z825 Family history of asthma and other chronic lower respiratory diseases: Secondary | ICD-10-CM

## 2018-10-19 DIAGNOSIS — G9341 Metabolic encephalopathy: Secondary | ICD-10-CM | POA: Diagnosis not present

## 2018-10-19 DIAGNOSIS — Z79899 Other long term (current) drug therapy: Secondary | ICD-10-CM

## 2018-10-19 DIAGNOSIS — L8990 Pressure ulcer of unspecified site, unspecified stage: Secondary | ICD-10-CM

## 2018-10-19 DIAGNOSIS — M2550 Pain in unspecified joint: Secondary | ICD-10-CM | POA: Diagnosis not present

## 2018-10-19 DIAGNOSIS — Z7189 Other specified counseling: Secondary | ICD-10-CM | POA: Diagnosis not present

## 2018-10-19 DIAGNOSIS — R29818 Other symptoms and signs involving the nervous system: Secondary | ICD-10-CM | POA: Diagnosis not present

## 2018-10-19 DIAGNOSIS — R633 Feeding difficulties: Secondary | ICD-10-CM | POA: Diagnosis not present

## 2018-10-19 LAB — BLOOD GAS, ARTERIAL
Acid-base deficit: 5.6 mmol/L — ABNORMAL HIGH (ref 0.0–2.0)
Bicarbonate: 19.3 mmol/L — ABNORMAL LOW (ref 20.0–28.0)
FIO2: 1
MECHVT: 500 mL
O2 Saturation: 100 %
PEEP: 10 cmH2O
Patient temperature: 37
RATE: 20 resp/min
pCO2 arterial: 35 mmHg (ref 32.0–48.0)
pH, Arterial: 7.35 (ref 7.350–7.450)
pO2, Arterial: 489 mmHg — ABNORMAL HIGH (ref 83.0–108.0)

## 2018-10-19 LAB — URINALYSIS, COMPLETE (UACMP) WITH MICROSCOPIC
Bilirubin Urine: NEGATIVE
Glucose, UA: NEGATIVE mg/dL
Ketones, ur: NEGATIVE mg/dL
Nitrite: NEGATIVE
Protein, ur: >=300 mg/dL — AB
RBC / HPF: >50 RBC/hpf — ABNORMAL HIGH (ref 0–5)
Specific Gravity, Urine: 1.018 (ref 1.005–1.030)
WBC, UA: >50 WBC/hpf — ABNORMAL HIGH (ref 0–5)
pH: 7 (ref 5.0–8.0)

## 2018-10-19 LAB — CBC WITH DIFFERENTIAL/PLATELET
Abs Immature Granulocytes: 0.12 10*3/uL — ABNORMAL HIGH (ref 0.00–0.07)
Basophils Absolute: 0.1 10*3/uL (ref 0.0–0.1)
Basophils Relative: 0 %
Eosinophils Absolute: 0.1 10*3/uL (ref 0.0–0.5)
Eosinophils Relative: 1 %
HCT: 36.3 % — ABNORMAL LOW (ref 39.0–52.0)
Hemoglobin: 11.4 g/dL — ABNORMAL LOW (ref 13.0–17.0)
Immature Granulocytes: 1 %
Lymphocytes Relative: 6 %
Lymphs Abs: 1.2 10*3/uL (ref 0.7–4.0)
MCH: 27.1 pg (ref 26.0–34.0)
MCHC: 31.4 g/dL (ref 30.0–36.0)
MCV: 86.4 fL (ref 80.0–100.0)
Monocytes Absolute: 1.4 10*3/uL — ABNORMAL HIGH (ref 0.1–1.0)
Monocytes Relative: 7 %
Neutro Abs: 17.8 10*3/uL — ABNORMAL HIGH (ref 1.7–7.7)
Neutrophils Relative %: 85 %
Platelets: 310 10*3/uL (ref 150–400)
RBC: 4.2 MIL/uL — ABNORMAL LOW (ref 4.22–5.81)
RDW: 16.4 % — ABNORMAL HIGH (ref 11.5–15.5)
WBC: 20.7 10*3/uL — ABNORMAL HIGH (ref 4.0–10.5)
nRBC: 0 % (ref 0.0–0.2)

## 2018-10-19 LAB — COMPREHENSIVE METABOLIC PANEL
ALT: 12 U/L (ref 0–44)
AST: 8 U/L — ABNORMAL LOW (ref 15–41)
Albumin: 2.5 g/dL — ABNORMAL LOW (ref 3.5–5.0)
Alkaline Phosphatase: 55 U/L (ref 38–126)
Anion gap: 13 (ref 5–15)
BUN: 86 mg/dL — ABNORMAL HIGH (ref 8–23)
CO2: 18 mmol/L — ABNORMAL LOW (ref 22–32)
Calcium: 7.9 mg/dL — ABNORMAL LOW (ref 8.9–10.3)
Chloride: 113 mmol/L — ABNORMAL HIGH (ref 98–111)
Creatinine, Ser: 8.53 mg/dL — ABNORMAL HIGH (ref 0.61–1.24)
GFR calc Af Amer: 7 mL/min — ABNORMAL LOW (ref >60)
GFR calc non Af Amer: 6 mL/min — ABNORMAL LOW (ref >60)
Glucose, Bld: 146 mg/dL — ABNORMAL HIGH (ref 70–99)
Potassium: 5.8 mmol/L — ABNORMAL HIGH (ref 3.5–5.1)
Sodium: 144 mmol/L (ref 135–145)
Total Bilirubin: 0.7 mg/dL (ref 0.3–1.2)
Total Protein: 6.2 g/dL — ABNORMAL LOW (ref 6.5–8.1)

## 2018-10-19 LAB — RENAL FUNCTION PANEL
Albumin: 2.5 g/dL — ABNORMAL LOW (ref 3.5–5.0)
Anion gap: 11 (ref 5–15)
BUN: 81 mg/dL — ABNORMAL HIGH (ref 8–23)
CO2: 21 mmol/L — ABNORMAL LOW (ref 22–32)
Calcium: 8 mg/dL — ABNORMAL LOW (ref 8.9–10.3)
Chloride: 116 mmol/L — ABNORMAL HIGH (ref 98–111)
Creatinine, Ser: 7.78 mg/dL — ABNORMAL HIGH (ref 0.61–1.24)
GFR calc Af Amer: 8 mL/min — ABNORMAL LOW (ref >60)
GFR calc non Af Amer: 7 mL/min — ABNORMAL LOW (ref >60)
Glucose, Bld: 99 mg/dL (ref 70–99)
Phosphorus: 6.2 mg/dL — ABNORMAL HIGH (ref 2.5–4.6)
Potassium: 5.2 mmol/L — ABNORMAL HIGH (ref 3.5–5.1)
Sodium: 148 mmol/L — ABNORMAL HIGH (ref 135–145)

## 2018-10-19 LAB — CBC
HCT: 33.4 % — ABNORMAL LOW (ref 39.0–52.0)
Hemoglobin: 9.8 g/dL — ABNORMAL LOW (ref 13.0–17.0)
MCH: 26.8 pg (ref 26.0–34.0)
MCHC: 29.3 g/dL — ABNORMAL LOW (ref 30.0–36.0)
MCV: 91.5 fL (ref 80.0–100.0)
Platelets: 187 10*3/uL (ref 150–400)
RBC: 3.65 MIL/uL — ABNORMAL LOW (ref 4.22–5.81)
RDW: 15.9 % — ABNORMAL HIGH (ref 11.5–15.5)
WBC: 15.5 10*3/uL — ABNORMAL HIGH (ref 4.0–10.5)
nRBC: 0 % (ref 0.0–0.2)

## 2018-10-19 LAB — GLUCOSE, CAPILLARY
Glucose-Capillary: 109 mg/dL — ABNORMAL HIGH (ref 70–99)
Glucose-Capillary: 55 mg/dL — ABNORMAL LOW (ref 70–99)
Glucose-Capillary: 99 mg/dL (ref 70–99)
Glucose-Capillary: 89 mg/dL (ref 70–99)
Glucose-Capillary: 94 mg/dL (ref 70–99)
Glucose-Capillary: 119 mg/dL — ABNORMAL HIGH (ref 70–99)

## 2018-10-19 LAB — BASIC METABOLIC PANEL
Anion gap: 14 (ref 5–15)
Anion gap: 13 (ref 5–15)
Anion gap: 12 (ref 5–15)
BUN: 83 mg/dL — ABNORMAL HIGH (ref 8–23)
BUN: 84 mg/dL — ABNORMAL HIGH (ref 8–23)
BUN: 80 mg/dL — ABNORMAL HIGH (ref 8–23)
CO2: 19 mmol/L — ABNORMAL LOW (ref 22–32)
CO2: 19 mmol/L — ABNORMAL LOW (ref 22–32)
CO2: 21 mmol/L — ABNORMAL LOW (ref 22–32)
Calcium: 7.8 mg/dL — ABNORMAL LOW (ref 8.9–10.3)
Calcium: 7.8 mg/dL — ABNORMAL LOW (ref 8.9–10.3)
Calcium: 7.8 mg/dL — ABNORMAL LOW (ref 8.9–10.3)
Chloride: 114 mmol/L — ABNORMAL HIGH (ref 98–111)
Chloride: 114 mmol/L — ABNORMAL HIGH (ref 98–111)
Chloride: 115 mmol/L — ABNORMAL HIGH (ref 98–111)
Creatinine, Ser: 8.3 mg/dL — ABNORMAL HIGH (ref 0.61–1.24)
Creatinine, Ser: 8.02 mg/dL — ABNORMAL HIGH (ref 0.61–1.24)
Creatinine, Ser: 7.59 mg/dL — ABNORMAL HIGH (ref 0.61–1.24)
GFR calc Af Amer: 7 mL/min — ABNORMAL LOW (ref >60)
GFR calc Af Amer: 7 mL/min — ABNORMAL LOW (ref >60)
GFR calc Af Amer: 8 mL/min — ABNORMAL LOW (ref >60)
GFR calc non Af Amer: 6 mL/min — ABNORMAL LOW (ref >60)
GFR calc non Af Amer: 6 mL/min — ABNORMAL LOW (ref >60)
GFR calc non Af Amer: 7 mL/min — ABNORMAL LOW (ref >60)
Glucose, Bld: 68 mg/dL — ABNORMAL LOW (ref 70–99)
Glucose, Bld: 61 mg/dL — ABNORMAL LOW (ref 70–99)
Glucose, Bld: 123 mg/dL — ABNORMAL HIGH (ref 70–99)
Potassium: 5.4 mmol/L — ABNORMAL HIGH (ref 3.5–5.1)
Potassium: 5 mmol/L (ref 3.5–5.1)
Potassium: 4.9 mmol/L (ref 3.5–5.1)
Sodium: 147 mmol/L — ABNORMAL HIGH (ref 135–145)
Sodium: 146 mmol/L — ABNORMAL HIGH (ref 135–145)
Sodium: 148 mmol/L — ABNORMAL HIGH (ref 135–145)

## 2018-10-19 LAB — PROCALCITONIN: Procalcitonin: 14.91 ng/mL

## 2018-10-19 LAB — PHOSPHORUS: Phosphorus: 5.8 mg/dL — ABNORMAL HIGH (ref 2.5–4.6)

## 2018-10-19 LAB — SARS CORONAVIRUS 2 BY RT PCR (HOSPITAL ORDER, PERFORMED IN ~~LOC~~ HOSPITAL LAB): SARS Coronavirus 2: NEGATIVE

## 2018-10-19 LAB — LACTIC ACID, PLASMA: Lactic Acid, Venous: 1 mmol/L (ref 0.5–1.9)

## 2018-10-19 LAB — PROTEIN / CREATININE RATIO, URINE
Creatinine, Urine: 45 mg/dL
Protein Creatinine Ratio: 42.69 mg/mg{Cre} — ABNORMAL HIGH (ref 0.00–0.15)
Total Protein, Urine: 1921 mg/dL

## 2018-10-19 LAB — MRSA PCR SCREENING: MRSA by PCR: POSITIVE — AB

## 2018-10-19 LAB — MAGNESIUM: Magnesium: 2.3 mg/dL (ref 1.7–2.4)

## 2018-10-19 LAB — TROPONIN I: Troponin I: <0.03 ng/mL (ref <0.03)

## 2018-10-19 MED ORDER — LACTATED RINGERS IV BOLUS
500.0000 mL | Freq: Once | INTRAVENOUS | Status: AC
  Administered 2018-10-19: 500 mL via INTRAVENOUS

## 2018-10-19 MED ORDER — ALBUMIN HUMAN 25 % IV SOLN
25.0000 g | Freq: Once | INTRAVENOUS | Status: AC
  Administered 2018-10-19: 25 g via INTRAVENOUS
  Filled 2018-10-19: qty 100

## 2018-10-19 MED ORDER — VANCOMYCIN HCL IN DEXTROSE 1-5 GM/200ML-% IV SOLN
1000.0000 mg | Freq: Once | INTRAVENOUS | Status: AC
  Administered 2018-10-19: 11:00:00 1000 mg via INTRAVENOUS
  Filled 2018-10-19: qty 200

## 2018-10-19 MED ORDER — DEXTROSE 50 % IV SOLN
INTRAVENOUS | Status: AC
  Administered 2018-10-19: 18:00:00 25 mL
  Filled 2018-10-19: qty 50

## 2018-10-19 MED ORDER — DEXTROSE 50 % IV SOLN
25.0000 mL | Freq: Once | INTRAVENOUS | Status: AC
  Administered 2018-10-19: 25 mL via INTRAVENOUS
  Filled 2018-10-19: qty 50

## 2018-10-19 MED ORDER — LEVETIRACETAM IN NACL 500 MG/100ML IV SOLN
500.0000 mg | Freq: Once | INTRAVENOUS | Status: AC
  Administered 2018-10-19: 15:00:00 500 mg via INTRAVENOUS
  Filled 2018-10-19: qty 100

## 2018-10-19 MED ORDER — PIPERACILLIN-TAZOBACTAM 3.375 G IVPB 30 MIN
3.3750 g | Freq: Once | INTRAVENOUS | Status: AC
  Administered 2018-10-19: 10:00:00 3.375 g via INTRAVENOUS
  Filled 2018-10-19: qty 50

## 2018-10-19 MED ORDER — HYDROCORTISONE NA SUCCINATE PF 100 MG IJ SOLR
100.0000 mg | Freq: Three times a day (TID) | INTRAMUSCULAR | Status: DC
  Administered 2018-10-19 – 2018-10-20 (×3): 100 mg via INTRAVENOUS
  Filled 2018-10-19 (×3): qty 2

## 2018-10-19 MED ORDER — CALCIUM GLUCONATE-NACL 1-0.675 GM/50ML-% IV SOLN
1.0000 g | Freq: Once | INTRAVENOUS | Status: AC
  Administered 2018-10-19: 1000 mg via INTRAVENOUS
  Filled 2018-10-19: qty 50

## 2018-10-19 MED ORDER — PROPOFOL 1000 MG/100ML IV EMUL
5.0000 ug/kg/min | INTRAVENOUS | Status: DC
  Administered 2018-10-19: 10 ug/kg/min via INTRAVENOUS
  Administered 2018-10-19: 5 ug/kg/min via INTRAVENOUS
  Administered 2018-10-20 (×3): 30 ug/kg/min via INTRAVENOUS
  Administered 2018-10-20: 20 ug/kg/min via INTRAVENOUS
  Administered 2018-10-21: 25 ug/kg/min via INTRAVENOUS
  Administered 2018-10-21: 10 ug/kg/min via INTRAVENOUS
  Administered 2018-10-22: 5 ug/kg/min via INTRAVENOUS
  Administered 2018-10-22 (×2): 20 ug/kg/min via INTRAVENOUS
  Administered 2018-10-23 (×2): 25 ug/kg/min via INTRAVENOUS
  Administered 2018-10-23: 10 ug/kg/min via INTRAVENOUS
  Administered 2018-10-24: 5 ug/kg/min via INTRAVENOUS
  Filled 2018-10-19 (×14): qty 100

## 2018-10-19 MED ORDER — SODIUM BICARBONATE 8.4 % IV SOLN
INTRAVENOUS | Status: DC
  Administered 2018-10-19 (×2): via INTRAVENOUS
  Filled 2018-10-19 (×6): qty 100

## 2018-10-19 MED ORDER — SODIUM CHLORIDE 0.9 % IV SOLN
1.0000 g | INTRAVENOUS | Status: DC
  Administered 2018-10-19: 18:00:00 1 g via INTRAVENOUS
  Filled 2018-10-19 (×2): qty 1

## 2018-10-19 MED ORDER — POLYETHYLENE GLYCOL 3350 17 G PO PACK
17.0000 g | PACK | Freq: Every day | ORAL | Status: DC | PRN
  Administered 2018-11-15: 17 g via ORAL
  Filled 2018-10-19 (×2): qty 1

## 2018-10-19 MED ORDER — HEPARIN SODIUM (PORCINE) 5000 UNIT/ML IJ SOLN
5000.0000 [IU] | Freq: Three times a day (TID) | INTRAMUSCULAR | Status: DC
  Administered 2018-10-19 – 2018-11-11 (×67): 5000 [IU] via SUBCUTANEOUS
  Filled 2018-10-19 (×66): qty 1

## 2018-10-19 MED ORDER — SUCCINYLCHOLINE CHLORIDE 20 MG/ML IJ SOLN
INTRAMUSCULAR | Status: AC | PRN
  Administered 2018-10-19: 100 mg via INTRAVENOUS

## 2018-10-19 MED ORDER — INSULIN ASPART 100 UNIT/ML IV SOLN
10.0000 [IU] | Freq: Once | INTRAVENOUS | Status: AC
  Administered 2018-10-19: 13:00:00 10 [IU] via INTRAVENOUS
  Filled 2018-10-19: qty 0.1

## 2018-10-19 MED ORDER — PROPOFOL 1000 MG/100ML IV EMUL
INTRAVENOUS | Status: AC
  Administered 2018-10-19: 10:00:00 5 ug/kg/min via INTRAVENOUS
  Filled 2018-10-19: qty 100

## 2018-10-19 MED ORDER — ETOMIDATE 2 MG/ML IV SOLN
INTRAVENOUS | Status: AC | PRN
  Administered 2018-10-19: 20 mg via INTRAVENOUS

## 2018-10-19 MED ORDER — LEVETIRACETAM IN NACL 500 MG/100ML IV SOLN
500.0000 mg | INTRAVENOUS | Status: DC
  Administered 2018-10-20 – 2018-10-21 (×2): 500 mg via INTRAVENOUS
  Filled 2018-10-19 (×2): qty 100

## 2018-10-19 NOTE — H&P (Signed)
[facility] [place] NAME: [name]    MR#:  [id number]  DATE OF BIRTH:  [birth year]  DATE OF ADMISSION:  10/19/2018  PRIMARY CARE PHYSICIAN: Garwin Brothers, MD   REQUESTING/REFERRING PHYSICIAN: Dr. Jimmye Norman  CHIEF COMPLAINT:   Patient was found with altered mental status and low blood sugar at [name]:  [name]  is a 63 y.o. male with a known history of cerebral hemorrhage, acute hypoxic respiratory failure was admitted at [facility] this year underwent head tracheostomy, history of CVA, hypertension, depression and history of drug abuse comes to the emergency room after he was found with low blood sugar of 34 at the facility. Patient received D10 why EMS came to the emergency room remains unresponsive despite sternal rub got intubated and placed on the ventilator for airway protection.  Patient was found to have creatinine of 8.5, potassium of 5.8 his baseline creatinine was 0.53 on August 26, 2018  Foley catheter was placed patient. Patient put out 1.2 L of urine. He had urinary retention and urine looked very dirty with abnormalities suggestive of UTI.  pt is being admitted with sepsis secondary to UTI and acute renal failure suspected due to urinary retention acute along with hypoglycemia.  ER physician spoke with patient's daughter. Daughter wants patient to be a full code for now.  Patient tested COVID negative.  PAST MEDICAL HISTORY:   Past Medical History:  Diagnosis Date  . Allergy   . Anxiety   . Cataract   . Cerebral hemorrhage ([place])   . Chest pain, atypical    12/2003:  negative cardiolyte  . Depression   . Drug abuse ([place])   . Eczema   . Erectile dysfunction   . History of meniscal tear    bilateral  . HLD (hyperlipidemia)   . HTN (hypertension)   . Hx of tear of ACL (anterior cruciate ligament)    right  . Insomnia   . Olecranon bursitis of left elbow 10/2009   s/p I&D  by Dr Maxie Better, initially assessed by Dr. Nori Riis   . Prediabetes   . Stroke ([place])   . Suicidal behavior 04/13/2017   Pt states taking a bunch of sleeping pills to end life   . TIA (transient ischemic attack)     PAST SURGICAL HISTOIRY:   Past Surgical History:  Procedure Laterality Date  . EP IMPLANTABLE DEVICE N/A 12/30/2015   Procedure: Loop Recorder Insertion;  Surgeon: Thompson Grayer, MD;  Location: [place] CV LAB;  Service: Cardiovascular;  Laterality: N/A;  . ESOPHAGOGASTRODUODENOSCOPY N/A 10/03/2016   Procedure: ESOPHAGOGASTRODUODENOSCOPY (EGD);  Surgeon: Irene Shipper, MD;  Location: [facility] ENDOSCOPY;  Service: Endoscopy;  Laterality: N/A;  . INCISE AND DRAIN ABCESS     L elbow due to cellulitis/bursitis  . INNER EAR SURGERY    . IR GENERIC HISTORICAL  12/28/2015   IR ANGIO VERTEBRAL SEL VERTEBRAL UNI L MOD SED 12/28/2015 Luanne Bras, MD [facility]  . IR GENERIC HISTORICAL  12/28/2015   IR ANGIO VERTEBRAL SEL SUBCLAVIAN INNOMINATE UNI R MOD SED 12/28/2015 Luanne Bras, MD [facility]  . IR GENERIC HISTORICAL  12/28/2015   IR ANGIO INTRA EXTRACRAN SEL INTERNAL CAROTID BILAT MOD SED 12/28/2015 Luanne Bras, MD [facility]  . KNEE ARTHROSCOPY    . RADIOLOGY WITH ANESTHESIA N/A 09/20/2015   Procedure: EMBOLIZATION         (RADIOLOGY WITH ANESTHESIA);  Surgeon: Luanne Bras,  MD;  Location: [place];  Service: Radiology;  Laterality: N/A;  . SKIN TAG REMOVAL     11 removed  . TEE WITHOUT CARDIOVERSION N/A 12/30/2015   Procedure: TRANSESOPHAGEAL ECHOCARDIOGRAM (TEE);  Surgeon: Larey Dresser, MD;  Location: [facility] ENDOSCOPY;  Service: Cardiovascular;  Laterality: N/A;    SOCIAL HISTORY:   Social History   Tobacco Use  . Smoking status: Never Smoker  . Smokeless tobacco: Never Used  Substance Use Topics  . Alcohol use: No    Alcohol/week: 0.0 standard drinks    FAMILY HISTORY:   Family History  Problem Relation Age of Onset  . Stroke Mother   . Hypertension Mother   .  Aneurysm Mother 69       Died of brain aneursym  . Heart failure Father   . Emphysema Father   . Diabetes Mellitus II Sister   . Colon cancer Neg Hx   . Rectal cancer Neg Hx   . Stomach cancer Neg Hx     DRUG ALLERGIES:   Allergies  Allergen Reactions  . Metformin Diarrhea    REVIEW OF SYSTEMS:  Review of Systems  Unable to perform ROS: Intubated     MEDICATIONS AT HOME:   Prior to Admission medications   Medication Sig Start Date End Date Taking? Authorizing Provider  albuterol (PROVENTIL) (2.5 MG/3ML) 0.083% nebulizer solution Take 2.5 mg by nebulization every 4 (four) hours as needed for wheezing or shortness of breath.   Yes [provider]  amLODipine (NORVASC) 10 MG tablet Take 1 tablet (10 mg total) by mouth daily. 12/31/15  Yes Arrien, Jimmy Picket, MD  atorvastatin (LIPITOR) 40 MG tablet Take 40 mg by mouth every evening.   Yes [provider]  carvedilol (COREG) 25 MG tablet Take 25 mg by mouth 2 (two) times daily with a meal.   Yes [provider]  doxazosin (CARDURA) 2 MG tablet Take 2 mg by mouth at bedtime.   Yes [provider]  DULoxetine (CYMBALTA) 20 MG capsule Take 1 capsule (20 mg total) by mouth daily. 05/14/17 10/19/18 Yes Eksir, Richard Miu, MD  DULoxetine (CYMBALTA) 60 MG capsule Take 1 capsule (60 mg total) by mouth daily. 05/14/17 10/19/18 Yes Eksir, Richard Miu, MD  famotidine (PEPCID) 20 MG tablet Take 20 mg by mouth 2 (two) times daily.   Yes [provider]  gabapentin (NEURONTIN) 300 MG/6ML solution Take 300 mg by mouth 3 (three) times daily.   Yes [provider]  glipiZIDE (GLUCOTROL) 5 MG tablet Take 5 mg by mouth 2 (two) times daily.   Yes [provider]  levETIRAcetam (KEPPRA) 750 MG tablet Take 750 mg by mouth 2 (two) times daily.   Yes [provider]  lisinopril (PRINIVIL,ZESTRIL) 40 MG tablet Take 40 mg by mouth daily. 12/28/16  Yes [provider]   Melatonin 3 MG TABS Take 3 mg by mouth at bedtime.   Yes [provider]      VITAL SIGNS:  Blood pressure (!) 105/59, pulse (!) 106, temperature 99.5 F (37.5 C), resp. rate 20, height 5\' 11"  (1.803 m), weight 120.6 kg, SpO2 98 %.  PHYSICAL EXAMINATION:  GENERAL:  63 y.o.-year-old patient lying in the bed with no acute distress. Morbidly obese EYES: Pupils equal, round, reactive to light and accommodation. No scleral icterus. Extraocular muscles intact.  HEENT: Head atraumatic, normocephalic. Oropharynx and nasopharynx clear. Intubated on the ventilator NECK:  Supple, no jugular venous distention. No thyroid enlargement, no tenderness.  Previous tracheostomy site looks normal LUNGS: Normal breath sounds bilaterally, no wheezing, rales,rhonchi or crepitation. No use of accessory muscles of respiration.  CARDIOVASCULAR: S1, S2 normal. No murmurs, rubs, or gallops.  ABDOMEN: Soft, nontender, nondistended.Abdominal obesity + Bowel sounds present. No organomegaly or mass.foley+  EXTREMITIES: ++ pedal edema, no cyanosis, or clubbing.  NEUROLOGIC: intubated on the ventilator  PSYCHIATRIC: sedated intubated on the ventilator SKIN: No obvious rash, lesion, or ulcer.   LABORATORY PANEL:   CBC Recent Labs  Lab 10/19/18 0900  WBC 20.7*  HGB 11.4*  HCT 36.3*  PLT 310   ------------------------------------------------------------------------------------------------------------------  Chemistries  Recent Labs  Lab 10/19/18 0900  NA 144  K 5.8*  CL 113*  CO2 18*  GLUCOSE 146*  BUN 86*  CREATININE 8.53*  CALCIUM 7.9*  AST 8*  ALT 12  ALKPHOS 55  BILITOT 0.7   ------------------------------------------------------------------------------------------------------------------  Cardiac Enzymes Recent Labs  Lab 10/19/18 0900  TROPONINI <0.03   ------------------------------------------------------------------------------------------------------------------  RADIOLOGY:   Dg Chest 1 View  Result Date: 10/19/2018 CLINICAL DATA:  Encounter for evaluation of ET and OG tube placement. EXAM: CHEST  1 VIEW COMPARISON:  04/02/2017 FINDINGS: Endotracheal tube has been placed with the tip approximately 4.5 cm above carina. Gastric tube extends to the decompressed stomach. Implanted event monitor overlies the left chest. Relatively low lung volumes with no focal infiltrate or overt edema. Heart size and mediastinal contours are within normal limits. No effusion. Visualized bones unremarkable. IMPRESSION: 1. Endotracheal tube and gastric tube placement as above. 2. No acute cardiopulmonary disease. Electronically Signed   By: Lucrezia Europe M.D.   On: 10/19/2018 11:02   Ct Head Wo Contrast  Result Date: 10/19/2018 CLINICAL DATA:  Altered level of consciousness. EXAM: CT HEAD WITHOUT CONTRAST TECHNIQUE: Contiguous axial images were obtained from the base of the skull through the vertex without intravenous contrast. COMPARISON:  CT head 04/13/2017 FINDINGS: Brain: Image quality degraded by motion and streak artifact from metal devices. 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 Interval left frontal craniotomy. New aneurysm clip in the region of the left A2 segment. Previously identified pipeline stent in the left A2 segment. Negative for acute hemorrhage, acute infarct, or mass lesion. Vascular: Negative for hyperdense vessel. 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 Skull: Interval left frontal craniotomy. 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 Chronic left mastoidectomy. Mucosal edema in the mastoidectomy cavity. Mucosal edema paranasal sinuses.  Negative orbit Other: None IMPRESSION: Left frontal craniotomy. Encephalomalacia in the anterior frontal 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 No acute hemorrhage or infarction. Electronically Signed   By: Franchot Gallo  M.D.   On: 10/19/2018 09:37    EKG:    IMPRESSION AND PLAN:   [name]  is a 63 y.o. male with a known history of cerebral hemorrhage, acute hypoxic respiratory failure was admitted at [facility] this year underwent head tracheostomy, history of CVA, hypertension, depression and history of drug abuse comes to the emergency room after he was found with low blood sugar of 34 at the facility. Patient received D10 why EMS came to the emergency room remains unresponsive despite sternal rub got intubated and placed on the ventilator for every protection.  1. Sepsis due to urinary tract infection -admit to ICU. Discussed with Dr. Patsey Berthold -IV fluids with bicarb and D5 -monitor input output -broad-spectrum antibiotic with vancomycin and Zosyn -follow-up urine culture and blood culture  2. Acute  renal failure suspected due to urinary retention with possible history of prostate enlargement (on-Cardura) -monitor input output -patient put out 1.5 L urine after Foley catheter was placed -baseline creatinine 0.53 in March 2020 -nephrology consultation with Dr. Zollie Scale spoke with him -avoid nephrotoxins -consider renal ultrasound  3. Hyperkalemia -IV dextrose with insulin, calcium gluconate, bicarb drip -metabolic panel Q4  4. Acute encephalopathy due to hypoglycemia and intubated for airway protection -continue D5 drip -monitor sugars -CT  Head Left frontal craniotomy. Encephalomalacia in the anterior frontal 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  5. History of cerebral hemorrhage. Patient on Keppra for seizure precautions will change to IV Keppra 750 BID  6. Leukocytosis due to UTI and sepsis  7. DVT prophylaxis subcu heparin All the records are reviewed and case discussed with ED provider.   CODE STATUS: full ER physician spoke with daughter on the phone  TOTAL CRITICAL TIME TAKING CARE OF THIS PATIENT: *55* minutes.    Fritzi Mandes M.D on  10/19/2018 at 11:34 AM  Between 7am to 6pm - Pager - [phone]  After 6pm go to [email or web address] - password [email or web address] [facility]  [facility]  Office  [phone]  CC: Primary care physician; Garwin Brothers, MD

## 2018-10-19 NOTE — ED Triage Notes (Addendum)
Pt arrives via [facility] after having some low blood sugars- pt had an IV in his right arm that infiltrated with potassium- pt had a cbg of 34 at facility- ems gave 250 of D10 and cbg increased to 184- pt was responsive yesterday but has had AMS since Tuesday- pt was found unresponsive this morning- per EMS eyes deviate to the right- pupils dilated- pt currently unresponsive and having labored breathing and sounds very gurgly- pt right arm swollen and hard from infiltrated potassium- last cbg was 113 per EMS

## 2018-10-19 NOTE — Progress Notes (Signed)
Pt transported on vent from ER to CCU without any issues.

## 2018-10-19 NOTE — ED Notes (Signed)
Sherlene Shams attempted IVAnda Kraft Rn using ultrasound to get an IV

## 2018-10-19 NOTE — Consult Note (Signed)
[facility] CONSULT NOTE    Date: 10/19/2018                  Patient Name:  [name]  MRN: [id number]  DOB: 1956/03/02  Age / Sex: 63 y.o., male         PCP: Garwin Brothers, MD                 Service Requesting Consult:  Hospitalist                 Reason for Consult:  Acute renal failure with urinary retention            History of Present Illness: Patient is a 63 y.o. male with a PMHx of cerebral hemorrhage, depression, erectile dysfunction, hyperlipidemia, hypertension, CVA, who was admitted to [facility] on 10/19/2018 for evaluation of altered mental status and hypoglycemia while at a nursing home.  Patient apparently has a recent admission at [facility].  At that time he had tracheostomy placement with history of CVA, hypertension, depression, and drug abuse.  He was at a local nursing home and found to be unresponsive there.  We are asked to see him for evaluation management of acute renal failure.  His baseline creatinine appears to be 0.53.  His creatinine is significantly elevated now at 8.53.  Patient also has hyperkalemia with a serum potassium of 5.8 and also appears to have metabolic acidosis with a serum bicarbonate of 18.  He is now on the ventilator and unable to offer any history.  Patient also had some urinary retention noted upon admission.   Medications: Outpatient medications: Medications Prior to Admission  Medication Sig Dispense Refill Last Dose  . albuterol (PROVENTIL) (2.5 MG/3ML) 0.083% nebulizer solution Take 2.5 mg by nebulization every 4 (four) hours as needed for wheezing or shortness of breath.   prn at prn  . amLODipine (NORVASC) 10 MG tablet Take 1 tablet (10 mg total) by mouth daily. 30 tablet 0 10/18/2018 at Unknown time  . atorvastatin (LIPITOR) 40 MG tablet Take 40 mg by mouth every evening.   10/18/2018 at Unknown time  . carvedilol (COREG) 25 MG tablet Take 25 mg by mouth 2 (two) times daily with a meal.   10/18/2018 at Unknown  time  . doxazosin (CARDURA) 2 MG tablet Take 2 mg by mouth at bedtime.   10/18/2018 at Unknown time  . DULoxetine (CYMBALTA) 20 MG capsule Take 1 capsule (20 mg total) by mouth daily. 90 capsule 1 10/18/2018 at Unknown time  . DULoxetine (CYMBALTA) 60 MG capsule Take 1 capsule (60 mg total) by mouth daily. 90 capsule 1 10/18/2018 at Unknown time  . famotidine (PEPCID) 20 MG tablet Take 20 mg by mouth 2 (two) times daily.   10/18/2018 at Unknown time  . gabapentin (NEURONTIN) 300 MG/6ML solution Take 300 mg by mouth 3 (three) times daily.   10/18/2018 at Unknown time  . glipiZIDE (GLUCOTROL) 5 MG tablet Take 5 mg by mouth 2 (two) times daily.   10/18/2018 at Unknown time  . levETIRAcetam (KEPPRA) 750 MG tablet Take 750 mg by mouth 2 (two) times daily.   10/18/2018 at Unknown time  . lisinopril (PRINIVIL,ZESTRIL) 40 MG tablet Take 40 mg by mouth daily.   10/18/2018 at Unknown time  . Melatonin 3 MG TABS Take 3 mg by mouth at bedtime.   10/18/2018 at Unknown time    Current medications: Current Facility-Administered Medications  Medication Dose Route Frequency  Provider Last Rate Last Dose  . ceFEPIme (MAXIPIME) 1 g in sodium chloride 0.9 % 100 mL IVPB  1 g Intravenous Q24H Cyndee Brightly [place], Omega      . heparin injection 5,000 Units  5,000 Units Subcutaneous Q8H Fritzi Mandes, MD      . levETIRAcetam (KEPPRA) IVPB 500 mg/100 mL premix  500 mg Intravenous Q24H Fritzi Mandes, MD      . polyethylene glycol (MIRALAX / GLYCOLAX) packet 17 g  17 g Oral Daily PRN Fritzi Mandes, MD      . propofol (DIPRIVAN) 1000 MG/100ML infusion  5-80 mcg/kg/min Intravenous Continuous Earleen Newport, MD 7.24 mL/hr at 10/19/18 1056 10 mcg/kg/min at 10/19/18 1056  . sodium bicarbonate 100 mEq in dextrose 5 % 1,000 mL infusion   Intravenous Continuous Fritzi Mandes, MD 100 mL/hr at 10/19/18 1250        Allergies: Allergies  Allergen Reactions  . Metformin Diarrhea      Past Medical History: Past Medical History:  Diagnosis  Date  . Allergy   . Anxiety   . Cataract   . Cerebral hemorrhage ([place])   . Chest pain, atypical    12/2003:  negative cardiolyte  . Depression   . Drug abuse ([place])   . Eczema   . Erectile dysfunction   . History of meniscal tear    bilateral  . HLD (hyperlipidemia)   . HTN (hypertension)   . Hx of tear of ACL (anterior cruciate ligament)    right  . Insomnia   . Olecranon bursitis of left elbow 10/2009   s/p I&D by Dr Maxie Better, initially assessed by Dr. Nori Riis   . Prediabetes   . Stroke ([place])   . Suicidal behavior 04/13/2017   Pt states taking a bunch of sleeping pills to end life   . TIA (transient ischemic attack)      Past Surgical History: Past Surgical History:  Procedure Laterality Date  . EP IMPLANTABLE DEVICE N/A 12/30/2015   Procedure: Loop Recorder Insertion;  Surgeon: Thompson Grayer, MD;  Location: [place] [facility];  Service: Cardiovascular;  Laterality: N/A;  . ESOPHAGOGASTRODUODENOSCOPY N/A 10/03/2016   Procedure: ESOPHAGOGASTRODUODENOSCOPY (EGD);  Surgeon: Irene Shipper, MD;  Location: [facility] ENDOSCOPY;  Service: Endoscopy;  Laterality: N/A;  . INCISE AND DRAIN ABCESS     L elbow due to cellulitis/bursitis  . INNER EAR SURGERY    . IR GENERIC HISTORICAL  12/28/2015   IR ANGIO VERTEBRAL SEL VERTEBRAL UNI L MOD SED 12/28/2015 Luanne Bras, MD [facility]  . IR GENERIC HISTORICAL  12/28/2015   IR ANGIO VERTEBRAL SEL SUBCLAVIAN INNOMINATE UNI R MOD SED 12/28/2015 Luanne Bras, MD [facility]  . IR GENERIC HISTORICAL  12/28/2015   IR ANGIO INTRA EXTRACRAN SEL INTERNAL CAROTID BILAT MOD SED 12/28/2015 Luanne Bras, MD [facility]  . KNEE ARTHROSCOPY    . RADIOLOGY WITH ANESTHESIA N/A 09/20/2015   Procedure: EMBOLIZATION         (RADIOLOGY WITH ANESTHESIA);  Surgeon: Luanne Bras, MD;  Location: [place];  Service: Radiology;  Laterality: N/A;  . SKIN TAG REMOVAL     11 removed  . TEE WITHOUT CARDIOVERSION N/A 12/30/2015   Procedure: TRANSESOPHAGEAL ECHOCARDIOGRAM  (TEE);  Surgeon: Larey Dresser, MD;  Location: [facility] ENDOSCOPY;  Service: Cardiovascular;  Laterality: N/A;     Family History: Family History  Problem Relation Age of Onset  . Stroke Mother   . Hypertension Mother   . Aneurysm Mother 26  Died of brain aneursym  . Heart failure Father   . Emphysema Father   . Diabetes Mellitus II Sister   . Colon cancer Neg Hx   . Rectal cancer Neg Hx   . Stomach cancer Neg Hx      Social History: Social History   Socioeconomic History  . Marital status: Divorced    Spouse name: Not on file  . Number of children: 2  . Years of education: Not on file  . Highest education level: Not on file  Occupational History  . Not on file  Social Needs  . Financial resource strain: Somewhat hard  . Food insecurity:    Worry: Sometimes true    Inability: Sometimes true  . Transportation needs:    Medical: Yes    Non-medical: Yes  Tobacco Use  . Smoking status: Never Smoker  . Smokeless tobacco: Never Used  Substance and Sexual Activity  . Alcohol use: No    Alcohol/week: 0.0 standard drinks  . Drug use: No  . Sexual activity: Never  Lifestyle  . Physical activity:    Days per week: 0 days    Minutes per session: 0 min  . Stress: Not at all  Relationships  . Social connections:    Talks on phone: More than three times a week    Gets together: More than three times a week    Attends religious service: Never    Active member of club or organization: No    Attends meetings of clubs or organizations: Never    Relationship status: Divorced  . Intimate partner violence:    Fear of current or ex partner: No    Emotionally abused: No    Physically abused: No    Forced sexual activity: No  Other Topics Concern  . Not on file  Social History Narrative   Separated, has 2 daughters, works as a Forensic psychologist.  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  Vital Signs: Blood pressure 114/60, pulse (!) 113, temperature 99.9 F (37.7 C), temperature source Axillary, resp. rate (!) 24, height 6' (1.829 m), weight 110.1 kg, SpO2 99 %.  Weight trends: Filed Weights   10/19/18 0911 10/19/18 1022 10/19/18 1309  Weight: 113.4 kg 120.6 kg 110.1 kg    Physical Exam: General:  Critically ill-appearing  Head: Normocephalic, atraumatic.  Eyes: Anicteric, EOMI  Nose: Mucous membranes moist, not inflammed, nonerythematous.  Throat:  Endotracheal tube in place  Neck: Supple, trachea midline.  Lungs:   Bilateral rhonchi, breath sounds vent assisted  Heart: S1S2 nor ubs  Abdomen:  BS normoactive. Soft, Nondistended, non-tender.  No masses or organomegaly.  Extremities: trace pretibial edema.  Neurologic:  Intubated, not following commands  Skin: No visible rashes, scars.    Lab results: Basic Metabolic Panel: Recent Labs  Lab 10/19/18 0900  NA 144  K 5.8*  CL 113*  CO2 18*  GLUCOSE 146*  BUN 86*  CREATININE 8.53*  CALCIUM 7.9*    Liver Function Tests: Recent Labs  Lab 10/19/18 0900  AST 8*  ALT 12  ALKPHOS 55  BILITOT 0.7  PROT 6.2*  ALBUMIN 2.5*   No results for input(s): LIPASE, AMYLASE in the last 168 hours. No results for input(s): AMMONIA in the last 168 hours.  CBC: Recent Labs  Lab 10/19/18 0900  WBC 20.7*  NEUTROABS 17.8*  HGB 11.4*  HCT 36.3*  MCV 86.4  PLT 310    Cardiac Enzymes: Recent Labs  Lab 10/19/18 0900  TROPONINI <0.03    BNP: Invalid input(s): POCBNP  CBG: Recent Labs  Lab 10/19/18 1306  GLUCAP 109*    Microbiology: Results for orders placed or performed during the hospital encounter of 10/19/18  SARS Coronavirus 2 (CEPHEID - Performed in [place] hospital lab), Hosp Order     Status: None   Collection Time: 10/19/18  9:28 AM  Result Value Ref Range Status   SARS Coronavirus 2 NEGATIVE  NEGATIVE Final    Comment: (NOTE) If result is NEGATIVE SARS-CoV-2 target nucleic acids are NOT DETECTED. The SARS-CoV-2 RNA is generally detectable in upper and lower  respiratory specimens during the acute phase of infection. The lowest  concentration of SARS-CoV-2 viral copies this assay can detect is 250  copies / mL. A negative result does not preclude SARS-CoV-2 infection  and should not be used as the sole basis for treatment or other  patient management decisions.  A negative result may occur with  improper specimen collection / handling, submission of specimen other  than nasopharyngeal swab, presence of viral mutation(s) within the  areas targeted by this assay, and inadequate number of viral copies  (<250 copies / mL). A negative result must be combined with clinical  observations, patient history, and epidemiological information. If result is POSITIVE SARS-CoV-2 target nucleic acids are DETECTED. The SARS-CoV-2 RNA is generally detectable in upper and lower  respiratory specimens dur ing the acute phase of infection.  Positive  results are indicative of active infection with SARS-CoV-2.  Clinical  correlation with patient history and other diagnostic information is  necessary to determine patient infection status.  Positive results do  not rule out bacterial infection or co-infection with other viruses. If result is PRESUMPTIVE POSTIVE SARS-CoV-2 nucleic acids MAY BE PRESENT.   A presumptive positive result was obtained on the submitted specimen  and confirmed on repeat testing.  While 2019 novel coronavirus  (SARS-CoV-2) nucleic acids may be present in the submitted sample  additional confirmatory testing may be necessary for epidemiological  and / or clinical management purposes  to differentiate between  SARS-CoV-2 and other Sarbecovirus currently known to infect humans.  If clinically indicated additional testing with an alternate test  methodology [phone]) is  advised. The SARS-CoV-2 RNA is generally  detectable in upper and lower respiratory sp ecimens during the acute  phase of infection. The expected result is Negative. Fact Sheet for Patients:  StrictlyIdeas.no Fact Sheet for Healthcare Providers: BankingDealers.co.za This test is not yet approved or cleared by the [place] FDA and has been authorized for detection and/or diagnosis of SARS-CoV-2 by FDA under an Emergency Use Authorization (EUA).  This EUA will remain in effect (meaning this test can be used) for the duration of the COVID-19 declaration under Section 564(b)(1) of the Act, 21 U.S.C. section 360bbb-3(b)(1), unless the authorization is terminated or revoked sooner. Performed at [facility], [address] [place], [place] [address]     Coagulation Studies: No results for input(s): LABPROT, INR in the last 72 hours.  Urinalysis: Recent Labs    10/19/18 1018  COLORURINE YELLOW*  LABSPEC 1.018  PHURINE 7.0  GLUCOSEU NEGATIVE  HGBUR SMALL*  BILIRUBINUR NEGATIVE  KETONESUR NEGATIVE  PROTEINUR >=300*  NITRITE NEGATIVE  LEUKOCYTESUR TRACE*      Imaging: Dg Chest 1 View  Result Date:  10/19/2018 CLINICAL DATA:  Encounter for evaluation of ET and OG tube placement. EXAM: CHEST  1 VIEW COMPARISON:  04/02/2017 FINDINGS: Endotracheal tube has been placed with the tip approximately 4.5 cm above carina. Gastric tube extends to the decompressed stomach. Implanted event monitor overlies the left chest. Relatively low lung volumes with no focal infiltrate or overt edema. Heart size and mediastinal contours are within normal limits. No effusion. Visualized bones unremarkable. IMPRESSION: 1. Endotracheal tube and gastric tube placement as above. 2. No acute cardiopulmonary disease. Electronically Signed   By: Lucrezia Europe M.D.   On: 10/19/2018 11:02   Ct Head Wo Contrast  Result Date: 10/19/2018 CLINICAL DATA:  Altered  level of consciousness. EXAM: CT HEAD WITHOUT CONTRAST TECHNIQUE: Contiguous axial images were obtained from the base of the skull through the vertex without intravenous contrast. COMPARISON:  CT head 04/13/2017 FINDINGS: Brain: Image quality degraded by motion and streak artifact from metal devices. 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 Interval left frontal craniotomy. New aneurysm clip in the region of the left A2 segment. Previously identified pipeline stent in the left A2 segment. Negative for acute hemorrhage, acute infarct, or mass lesion. Vascular: Negative for hyperdense vessel. 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 Skull: Interval left frontal craniotomy. 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 Chronic left mastoidectomy. Mucosal edema in the mastoidectomy cavity. Mucosal edema paranasal sinuses.  Negative orbit Other: None IMPRESSION: Left frontal craniotomy. Encephalomalacia in the anterior frontal 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 No acute hemorrhage or infarction. Electronically Signed   By: Franchot Gallo M.D.   On: 10/19/2018 09:37      Assessment & Plan: Pt is a 63 y.o. male with a PMHx of cerebral hemorrhage, depression, erectile dysfunction, hyperlipidemia, hypertension, CVA, who was admitted to [facility] on 10/19/2018 for evaluation of altered mental status and hypoglycemia while at a nursing home.   Patient with relatively recent admission to [facility] where patient had tracheostomy in the setting of cerebral aneurysm.  1.  Acute renal failure creatinine 3.53 with baseline creatinine of 0.5. 2.  Urinary retention. 3.  Hyperkalemia serum potassium 5.8. 4.  Metabolic acidosis.  Plan: Patient did have some urinary retention upon admission as reported by Dr. Posey Pronto as he had some urine in the bladder when Foley catheter was placed.  He is suspected of  having urosepsis at this time.  Agree with broad-spectrum antibiotics.  Patient started on sodium bicarbonate drip 2 A in D5W to be given at 100 cc/h.  This should help with his acidosis as well as his hyperkalemia.  No immediate need for dialysis however this may need to be considered if renal parameters continue to deteriorate or if serum electrolytes get worse.  We will also further evaluate with renal ultrasound.  Further plan as patient progresses.

## 2018-10-19 NOTE — Progress Notes (Signed)
Pharmacy Antibiotic Note  [name] is a 63 y.o. male admitted on 10/19/2018 with UTI.  PMH significant for CVA, HTN, tracheostomy. Patient presented with low blood sugar and was unresponsive. Baseline Scr from [facility] in 07/2018 was ~0.55.  Pharmacy has been consulted for Cefepime dosing.  Plan: Discussed with provider to continue with cefepime monotherapy as patient is currently in AKI (MRSA and anaerobes unlikely cause for UTI).   Will order Cefepime 1 g q24h.   Height: 5\' 11"  (180.3 cm) Weight: 265 lb 14 oz (120.6 kg) IBW/kg (Calculated) : 75.3  Temp (24hrs), Avg:99.7 F (37.6 C), Min:99.5 F (37.5 C), Max:100.3 F (37.9 C)  Recent Labs  Lab 10/19/18 0900 10/19/18 0935  WBC 20.7*  --   CREATININE 8.53*  --   LATICACIDVEN  --  1.0    Estimated Creatinine Clearance: 11.6 mL/min (A) (by C-G formula based on SCr of 8.53 mg/dL (H)).    Allergies  Allergen Reactions  . Metformin Diarrhea    Antimicrobials this admission: 5/23 Vancomycin x 1 5/23 Zosyn x 1 5/23 Cefepime >>  Dose adjustments this admission: N/A  Microbiology results: 5/23 BCx: pending    5/23 MRSA PCR: pending 5/23 COVID-19 (-)  Thank you for allowing pharmacy to be a part of this patient's care.  Paticia Stack, PharmD Pharmacy Resident  10/19/2018 12:14 PM

## 2018-10-19 NOTE — Progress Notes (Signed)
CODE SEPSIS - PHARMACY COMMUNICATION  **Broad Spectrum Antibiotics should be administered within 1 hour of Sepsis diagnosis**  Time Code Sepsis Called/Page Received: 1129  Antibiotics Ordered: Vancomycin + Zosyn  Time of 1st antibiotic administration: 1025  Additional action taken by pharmacy: None  If necessary, Name of Provider/Nurse Contacted: [place], PharmD Pharmacy Resident  10/19/2018 11:56 AM

## 2018-10-19 NOTE — ED Notes (Signed)
Dr Jimmye Norman at bedside and states he will intubate pt

## 2018-10-19 NOTE — Consult Note (Addendum)
Reason for Consult: Assistance with management of ventilator and critical care issues. Referring Physician: Dr. Fritzi Mandes  [name] is an 63 y.o. male.  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  The patient has been in a skilled nursing facility after management of his pericallosal aneurysm with aneurysm clipping.  He had a complex postoperative history and required tracheostomy.  He was noted to be persistently unresponsive despite glucose being given at the facility.  Patient is obtunded and could not give history.  He was subsequently intubated in the emergency room due to inability to protect his airway.  Additional studies show that the patient had a creatinine of 8.5 and potassium of 5.8.  His baseline creatinine is 0.53.  This was noted on March 2020.  Foley catheter was placed and he had 1.2 L of urine retained.  Urine appeared purulent.  He has been admitted due to near sepsis and acute encephalopathy.  Patient likely had Rosanne Gutting retention noted due to history of BPH.  Patient cannot provide further history.  History is obtained from emergency room records and discussion with Dr. Posey Pronto.    Past Medical History:  Diagnosis Date  . Allergy   . Anxiety   . Cataract   . Cerebral hemorrhage ([place])   . Chest pain, atypical    12/2003:  negative cardiolyte  . Depression   . Drug abuse ([place])   . Eczema   . Erectile dysfunction   . History of meniscal tear    bilateral  . HLD (hyperlipidemia)   . HTN (hypertension)   . Hx of tear of ACL (anterior cruciate ligament)    right  . Insomnia   . Olecranon bursitis of left elbow 10/2009   s/p I&D by Dr Maxie Better, initially assessed by Dr. Nori Riis   . Prediabetes   . Stroke ([place])   . Suicidal behavior 04/13/2017   Pt states  taking a bunch of sleeping pills to end life   . TIA (transient ischemic attack)     Past Surgical History:  Procedure Laterality Date  . EP IMPLANTABLE DEVICE N/A 12/30/2015   Procedure: Loop Recorder Insertion;  Surgeon: Thompson Grayer, MD;  Location: [place] [facility];  Service: Cardiovascular;  Laterality: N/A;  . ESOPHAGOGASTRODUODENOSCOPY N/A 10/03/2016   Procedure: ESOPHAGOGASTRODUODENOSCOPY (EGD);  Surgeon: Irene Shipper, MD;  Location: [facility] ENDOSCOPY;  Service: Endoscopy;  Laterality: N/A;  . INCISE AND DRAIN ABCESS     L elbow due to cellulitis/bursitis  . INNER EAR SURGERY    . IR GENERIC HISTORICAL  12/28/2015   IR ANGIO VERTEBRAL SEL VERTEBRAL UNI L MOD SED 12/28/2015 Luanne Bras, MD [facility]  . IR GENERIC HISTORICAL  12/28/2015   IR ANGIO VERTEBRAL SEL SUBCLAVIAN INNOMINATE UNI R MOD SED 12/28/2015 Luanne Bras, MD [facility]  . IR GENERIC HISTORICAL  12/28/2015   IR ANGIO INTRA EXTRACRAN SEL INTERNAL CAROTID BILAT MOD SED 12/28/2015 Luanne Bras, MD [facility]  . KNEE ARTHROSCOPY    . RADIOLOGY WITH ANESTHESIA N/A 09/20/2015   Procedure: EMBOLIZATION         (RADIOLOGY WITH ANESTHESIA);  Surgeon: Luanne Bras, MD;  Location: [place];  Service: Radiology;  Laterality: N/A;  . SKIN TAG REMOVAL     11 removed  . TEE  WITHOUT CARDIOVERSION N/A 12/30/2015   Procedure: TRANSESOPHAGEAL ECHOCARDIOGRAM (TEE);  Surgeon: Larey Dresser, MD;  Location: [facility] ENDOSCOPY;  Service: Cardiovascular;  Laterality: N/A;    Family History  Problem Relation Age of Onset  . Stroke Mother   . Hypertension Mother   . Aneurysm Mother 54       Died of brain aneursym  . Heart failure Father   . Emphysema Father   . Diabetes Mellitus II Sister   . Colon cancer Neg Hx   . Rectal cancer Neg Hx   . Stomach cancer Neg Hx     Social History:  reports that he has never smoked. He has never used smokeless tobacco. He reports that he does not drink alcohol or use drugs.  Allergies:   Allergies  Allergen Reactions  . Metformin Diarrhea    Medications:  I have reviewed the patient's current medications. Prior to Admission:  Medications Prior to Admission  Medication Sig Dispense Refill Last Dose  . albuterol (PROVENTIL) (2.5 MG/3ML) 0.083% nebulizer solution Take 2.5 mg by nebulization every 4 (four) hours as needed for wheezing or shortness of breath.   prn at prn  . amLODipine (NORVASC) 10 MG tablet Take 1 tablet (10 mg total) by mouth daily. 30 tablet 0 10/18/2018 at Unknown time  . atorvastatin (LIPITOR) 40 MG tablet Take 40 mg by mouth every evening.   10/18/2018 at Unknown time  . carvedilol (COREG) 25 MG tablet Take 25 mg by mouth 2 (two) times daily with a meal.   10/18/2018 at Unknown time  . doxazosin (CARDURA) 2 MG tablet Take 2 mg by mouth at bedtime.   10/18/2018 at Unknown time  . DULoxetine (CYMBALTA) 20 MG capsule Take 1 capsule (20 mg total) by mouth daily. 90 capsule 1 10/18/2018 at Unknown time  . DULoxetine (CYMBALTA) 60 MG capsule Take 1 capsule (60 mg total) by mouth daily. 90 capsule 1 10/18/2018 at Unknown time  . famotidine (PEPCID) 20 MG tablet Take 20 mg by mouth 2 (two) times daily.   10/18/2018 at Unknown time  . gabapentin (NEURONTIN) 300 MG/6ML solution Take 300 mg by mouth 3 (three) times daily.   10/18/2018 at Unknown time  . glipiZIDE (GLUCOTROL) 5 MG tablet Take 5 mg by mouth 2 (two) times daily.   10/18/2018 at Unknown time  . levETIRAcetam (KEPPRA) 750 MG tablet Take 750 mg by mouth 2 (two) times daily.   10/18/2018 at Unknown time  . lisinopril (PRINIVIL,ZESTRIL) 40 MG tablet Take 40 mg by mouth daily.   10/18/2018 at Unknown time  . Melatonin 3 MG TABS Take 3 mg by mouth at bedtime.   10/18/2018 at Unknown time    Results for orders placed or performed during the hospital encounter of 10/19/18 (from the past 48 hour(s))  CBC with Differential     Status: Abnormal   Collection Time: 10/19/18  9:00 AM  Result Value Ref [place]   WBC 20.7 (H)  4.0 - 10.5 K/uL   RBC 4.20 (L) 4.22 - 5.81 MIL/uL   Hemoglobin 11.4 (L) 13.0 - 17.0 g/dL   HCT 36.3 (L) 39.0 - 52.0 %   MCV 86.4 80.0 - 100.0 fL   MCH 27.1 26.0 - 34.0 pg   MCHC 31.4 30.0 - 36.0 g/dL   RDW 16.4 (H) 11.5 - 15.5 %   Platelets 310 150 - 400 K/uL   nRBC 0.0 0.0 - 0.2 %   Neutrophils Relative % 85 %   Neutro Abs  17.8 (H) 1.7 - 7.7 K/uL   Lymphocytes Relative 6 %   Lymphs Abs 1.2 0.7 - 4.0 K/uL   Monocytes Relative 7 %   Monocytes Absolute 1.4 (H) 0.1 - 1.0 K/uL   Eosinophils Relative 1 %   Eosinophils Absolute 0.1 0.0 - 0.5 K/uL   Basophils Relative 0 %   Basophils Absolute 0.1 0.0 - 0.1 K/uL   Immature Granulocytes 1 %   Abs Immature Granulocytes 0.12 (H) 0.00 - 0.07 K/uL    Comment: Performed at [facility], [address] [place], [place] [address]  Comprehensive metabolic panel     Status: Abnormal   Collection Time: 10/19/18  9:00 AM  Result Value Ref Range   Sodium 144 135 - 145 mmol/L   Potassium 5.8 (H) 3.5 - 5.1 mmol/L   Chloride 113 (H) 98 - 111 mmol/L   CO2 18 (L) 22 - 32 mmol/L   Glucose, Bld 146 (H) 70 - 99 mg/dL   BUN 86 (H) 8 - 23 mg/dL   Creatinine, Ser 8.53 (H) 0.61 - 1.24 mg/dL   Calcium 7.9 (L) 8.9 - 10.3 mg/dL   Total Protein 6.2 (L) 6.5 - 8.1 g/dL   Albumin 2.5 (L) 3.5 - 5.0 g/dL   AST 8 (L) 15 - 41 U/L   ALT 12 0 - 44 U/L   Alkaline Phosphatase 55 38 - 126 U/L   Total Bilirubin 0.7 0.3 - 1.2 mg/dL   GFR calc non Af Amer 6 (L) >60 mL/min   GFR calc Af Amer 7 (L) >60 mL/min   Anion gap 13 5 - 15    Comment: Performed at [facility], [address] [place], [place] [address]  Troponin I - ONCE - STAT     Status: None   Collection Time: 10/19/18  9:00 AM  Result Value Ref Range   Troponin I <0.03 <0.03 ng/mL    Comment: Performed at [facility], [address], [place], [place] [address]  SARS Coronavirus 2 (CEPHEID - Performed in [place] hospital lab), Hosp Order     Status: None   Collection Time:  10/19/18  9:28 AM  Result Value Ref Range   SARS Coronavirus 2 NEGATIVE NEGATIVE    Comment: (NOTE) If result is NEGATIVE SARS-CoV-2 target nucleic acids are NOT DETECTED. The SARS-CoV-2 RNA is generally detectable in upper and lower  respiratory specimens during the acute phase of infection. The lowest  concentration of SARS-CoV-2 viral copies this assay can detect is 250  copies / mL. A negative result does not preclude SARS-CoV-2 infection  and should not be used as the sole basis for treatment or other  patient management decisions.  A negative result may occur with  improper specimen collection / handling, submission of specimen other  than nasopharyngeal swab, presence of viral mutation(s) within the  areas targeted by this assay, and inadequate number of viral copies  (<250 copies / mL). A negative result must be combined with clinical  observations, patient history, and epidemiological information. If result is POSITIVE SARS-CoV-2 target nucleic acids are DETECTED. The SARS-CoV-2 RNA is generally detectable in upper and lower  respiratory specimens dur ing the acute phase of infection.  Positive  results are indicative of active infection with SARS-CoV-2.  Clinical  correlation with patient history and other diagnostic information is  necessary to determine patient infection status.  Positive results do  not rule out bacterial infection or co-infection with other viruses. If result  is PRESUMPTIVE POSTIVE SARS-CoV-2 nucleic acids MAY BE PRESENT.   A presumptive positive result was obtained on the submitted specimen  and confirmed on repeat testing.  While 2019 novel coronavirus  (SARS-CoV-2) nucleic acids may be present in the submitted sample  additional confirmatory testing may be necessary for epidemiological  and / or clinical management purposes  to differentiate between  SARS-CoV-2 and other Sarbecovirus currently known to infect humans.  If clinically indicated  additional testing with an alternate test  methodology [phone]) is advised. The SARS-CoV-2 RNA is generally  detectable in upper and lower respiratory sp ecimens during the acute  phase of infection. The expected result is Negative. Fact Sheet for Patients:  StrictlyIdeas.no Fact Sheet for Healthcare Providers: BankingDealers.co.za This test is not yet approved or cleared by the [place] FDA and has been authorized for detection and/or diagnosis of SARS-CoV-2 by FDA under an Emergency Use Authorization (EUA).  This EUA will remain in effect (meaning this test can be used) for the duration of the COVID-19 declaration under Section 564(b)(1) of the Act, 21 U.S.C. section 360bbb-3(b)(1), unless the authorization is terminated or revoked sooner. Performed at [facility], [address] [place], [place] [address]   Lactic acid, plasma     Status: None   Collection Time: 10/19/18  9:35 AM  Result Value Ref Range   Lactic Acid, Venous 1.0 0.5 - 1.9 mmol/L    Comment: Performed at [facility], [address] [place], [place] [address]  Urinalysis, Complete w Microscopic     Status: Abnormal   Collection Time: 10/19/18 10:18 AM  Result Value Ref Range   Color, Urine YELLOW (A) YELLOW   APPearance CLOUDY (A) CLEAR   Specific Gravity, Urine 1.018 1.005 - 1.030   pH 7.0 5.0 - 8.0   Glucose, UA NEGATIVE NEGATIVE mg/dL   Hgb urine dipstick SMALL (A) NEGATIVE   Bilirubin Urine NEGATIVE NEGATIVE   Ketones, ur NEGATIVE NEGATIVE mg/dL   Protein, ur >=300 (A) NEGATIVE mg/dL   Nitrite NEGATIVE NEGATIVE   Leukocytes,Ua TRACE (A) NEGATIVE   RBC / HPF >50 (H) 0 - 5 RBC/hpf   WBC, UA >50 (H) 0 - 5 WBC/hpf   Bacteria, UA MANY (A) NONE SEEN   Squamous Epithelial / LPF 0-5 0 - 5   WBC Clumps PRESENT    Mucus PRESENT     Comment: Performed at [facility], [address] [place], [place] [address]  Blood gas,  arterial     Status: Abnormal   Collection Time: 10/19/18 11:01 AM  Result Value Ref Range   FIO2 1.00    Delivery systems VENTILATOR    Mode ASSIST CONTROL    VT 500 mL   LHR 20 resp/min   Peep/cpap 10.0 cm H20   pH, Arterial 7.35 7.350 - 7.450   pCO2 arterial 35 32.0 - 48.0 mmHg   pO2, Arterial 489 (H) 83.0 - 108.0 mmHg   Bicarbonate 19.3 (L) 20.0 - 28.0 mmol/L   Acid-base deficit 5.6 (H) 0.0 - 2.0 mmol/L   O2 Saturation 100.0 %   Patient temperature 37.0    Collection site RIGHT RADIAL    Sample type ARTERIAL DRAW    Allens test (pass/fail) ARTERIAL DRAW (A) PASS    Comment: Performed at [facility], [address] [place], [place] [address]  MRSA PCR Screening     Status: Abnormal   Collection Time: 10/19/18  1:06 PM  Result Value Ref Range  MRSA by PCR POSITIVE (A) NEGATIVE    Comment:        The GeneXpert MRSA Assay (FDA approved for NASAL specimens only), is one component of a comprehensive MRSA colonization surveillance program. It is not intended to diagnose MRSA infection nor to guide or monitor treatment for MRSA infections. CRITICAL RESULT CALLED TO, READ BACK BY AND VERIFIED WITH: CALLED TO TANYA SILVA @1426  10/19/2018 [facility] Performed at [facility], [address] [place],  [address]   Glucose, capillary     Status: Abnormal   Collection Time: 10/19/18  1:06 PM  Result Value Ref Range   Glucose-Capillary 109 (H) 70 - 99 mg/dL  Procalcitonin - Baseline     Status: None   Collection Time: 10/19/18  1:57 PM  Result Value Ref Range   Procalcitonin 14.91 ng/mL    Comment:        Interpretation: PCT >= 10 ng/mL: Important systemic inflammatory response, almost exclusively due to severe bacterial sepsis or septic shock. (NOTE)       Sepsis PCT Algorithm           Lower Respiratory Tract                                      Infection PCT Algorithm    ----------------------------     ----------------------------         PCT <  0.25 ng/mL                PCT < 0.10 ng/mL         Strongly encourage             Strongly discourage   discontinuation of antibiotics    initiation of antibiotics    ----------------------------     -----------------------------       PCT 0.25 - 0.50 ng/mL            PCT 0.10 - 0.25 ng/mL               OR       >80% decrease in PCT            Discourage initiation of                                            antibiotics      Encourage discontinuation           of antibiotics    ----------------------------     -----------------------------         PCT >= 0.50 ng/mL              PCT 0.26 - 0.50 ng/mL                AND       <80% decrease in PCT             Encourage initiation of                                             antibiotics       Encourage continuation           of antibiotics    ----------------------------     -----------------------------  PCT >= 0.50 ng/mL                  PCT > 0.50 ng/mL               AND         increase in PCT                  Strongly encourage                                      initiation of antibiotics    Strongly encourage escalation           of antibiotics                                     -----------------------------                                           PCT <= 0.25 ng/mL                                                 OR                                        > 80% decrease in PCT                                     Discontinue / Do not initiate                                             antibiotics Performed at [facility], [address] [place], [place] [address]   CBC     Status: Abnormal   Collection Time: 10/19/18  1:57 PM  Result Value Ref Range   WBC 15.5 (H) 4.0 - 10.5 K/uL   RBC 3.65 (L) 4.22 - 5.81 MIL/uL   Hemoglobin 9.8 (L) 13.0 - 17.0 g/dL   HCT 33.4 (L) 39.0 - 52.0 %   MCV 91.5 80.0 - 100.0 fL   MCH 26.8 26.0 - 34.0 pg   MCHC 29.3 (L) 30.0 - 36.0 g/dL   RDW 15.9 (H) 11.5 - 15.5 %    Platelets 187 150 - 400 K/uL   nRBC 0.0 0.0 - 0.2 %    Comment: Performed at [facility], [address] [place],  [address]  Basic metabolic panel     Status: Abnormal   Collection Time: 10/19/18  1:57 PM  Result Value Ref Range   Sodium 147 (H) 135 - 145 mmol/L   Potassium 5.4 (H) 3.5 - 5.1 mmol/L   Chloride 114 (H) 98 - 111 mmol/L   CO2 19 (L) 22 - 32 mmol/L   Glucose, Bld 68 (L) 70 - 99 mg/dL   BUN 83 (  H) 8 - 23 mg/dL   Creatinine, Ser 8.30 (H) 0.61 - 1.24 mg/dL   Calcium 7.8 (L) 8.9 - 10.3 mg/dL   GFR calc non Af Amer 6 (L) >60 mL/min   GFR calc Af Amer 7 (L) >60 mL/min   Anion gap 14 5 - 15    Comment: Performed at [facility], [address] [place], [place] [address]  Basic metabolic panel     Status: Abnormal   Collection Time: 10/19/18  4:36 PM  Result Value Ref Range   Sodium 146 (H) 135 - 145 mmol/L   Potassium 5.0 3.5 - 5.1 mmol/L   Chloride 114 (H) 98 - 111 mmol/L   CO2 19 (L) 22 - 32 mmol/L   Glucose, Bld 61 (L) 70 - 99 mg/dL   BUN 84 (H) 8 - 23 mg/dL   Creatinine, Ser 8.02 (H) 0.61 - 1.24 mg/dL   Calcium 7.8 (L) 8.9 - 10.3 mg/dL   GFR calc non Af Amer 6 (L) >60 mL/min   GFR calc Af Amer 7 (L) >60 mL/min   Anion gap 13 5 - 15    Comment: Performed at [facility], [address] [place], [place] [address]  Glucose, capillary     Status: Abnormal   Collection Time: 10/19/18  6:15 PM  Result Value Ref Range   Glucose-Capillary 55 (L) 70 - 99 mg/dL    Dg Chest 1 View  Result Date: 10/19/2018 CLINICAL DATA:  Encounter for evaluation of ET and OG tube placement. EXAM: CHEST  1 VIEW COMPARISON:  04/02/2017 FINDINGS: Endotracheal tube has been placed with the tip approximately 4.5 cm above carina. Gastric tube extends to the decompressed stomach. Implanted event monitor overlies the left chest. Relatively low lung volumes with no focal infiltrate or overt edema. Heart size and mediastinal contours are within normal limits. No  effusion. Visualized bones unremarkable. IMPRESSION: 1. Endotracheal tube and gastric tube placement as above. 2. No acute cardiopulmonary disease. Electronically Signed   By: Lucrezia Europe M.D.   On: 10/19/2018 11:02   Ct Head Wo Contrast  Result Date: 10/19/2018 CLINICAL DATA:  Altered level of consciousness. EXAM: CT HEAD WITHOUT CONTRAST TECHNIQUE: Contiguous axial images were obtained from the base of the skull through the vertex without intravenous contrast. COMPARISON:  CT head 04/13/2017 FINDINGS: Brain: Image quality degraded by motion and streak artifact from metal devices. 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 Interval left frontal craniotomy. New aneurysm clip in the region of the left A2 segment. Previously identified pipeline stent in the left A2 segment. Negative for acute hemorrhage, acute infarct, or mass lesion. Vascular: Negative for hyperdense vessel. 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 Skull: Interval left frontal craniotomy. 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 Chronic left mastoidectomy. Mucosal edema in the mastoidectomy cavity. Mucosal edema paranasal sinuses.  Negative orbit Other: None IMPRESSION: Left frontal craniotomy. Encephalomalacia in the anterior frontal 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 No acute hemorrhage or infarction. Electronically Signed   By: Franchot Gallo M.D.   On: 10/19/2018 09:37   US Renal  Result Date: 10/19/2018 CLINICAL DATA:  63 year old male with a history of acute renal failure EXAM: RENAL / URINARY TRACT ULTRASOUND COMPLETE COMPARISON:  CT 10/20/2016 FINDINGS: Right Kidney: Length: 12.3 cm x 6.7 cm x 6.6 cm, 281 cc. Hydronephrosis of the right kidney new from the comparison CT scan. Flow confirmed  in the hilum of the right kidney. Left Kidney: Length: 13.1 cm x 5.5 cm x 5.6 cm, 211 cc. Hydronephrosis of the left  kidney, new from the comparison CT. Flow confirmed in the hilum of the left kidney. Bladder: Foley catheter within the urinary bladder which is decompressed. IMPRESSION: Bilateral hydronephrosis, new from the comparison CT. Further evaluation with abdomen/pelvis CT imaging may be useful. Electronically Signed   By: Corrie Mckusick D.O.   On: 10/19/2018 15:35    Review of Systems  Unable to perform ROS: Critical illness   Blood pressure 123/64, pulse (!) 105, temperature (!) 102 F (38.9 C), temperature source Bladder, resp. rate 20, height 6' (1.829 m), weight 110.1 kg, SpO2 98 %. Physical Exam  Nursing note and vitals reviewed. Constitutional: He appears well-developed. He appears ill. He is intubated.  Obese  HENT:  Head: Normocephalic.  Right Ear: External ear normal.  Left Ear: External ear normal.  Nose: Nose normal.  Mouth/Throat: Mucous membranes are dry and not cyanotic.  Evidence of prior craniotomy.  Eyes: Pupils are equal, round, and reactive to light. Conjunctivae are normal. No scleral icterus.  Neck: Neck supple. No JVD present. No tracheal deviation present. No thyromegaly present.  Cardiovascular: Normal rate, regular rhythm, S1 normal, S2 normal and normal pulses.  No murmur heard. Respiratory: Breath sounds normal. He is intubated. He has no wheezes. He has no rhonchi. He has no rales.  Initially asynchronous with the ventilator, switched to SIMV with pressure support this corrected this issue.  GI: Soft. Bowel sounds are normal. He exhibits no distension and no ascites. There is no hepatosplenomegaly.  Genitourinary:    Genitourinary Comments: Foley in place draining  cloudy urine   Musculoskeletal:        General: Edema present.     Comments: 2+ pitting edema lower extremities.  Neurological: He is unresponsive.  Unresponsive.  Skin: Skin is warm and dry.  Psychiatric:  Intubated cannot make assessment.     Laboratory data has been dependently reviewed.   Imaging has been dependently reviewed.   Assessment/Plan:  1.  Acute hypoxic respiratory failure due to acute encephalopathy (toxic metabolic): Continue ventilator support.  Ventilator changes were made.  Discussed with respiratory therapy.  Daily wake-up assessment and SBT as indicated.  2.  Acute renal failure likely due to bladder outlet obstruction due to BPH: Foley catheter to relieve obstruction.  Patient also has element of volume depletion, volume resuscitate.  Follow renal panel.  Avoid nephrotoxins.  3.  Urinary tract infection due to bladder outlet obstruction with subsequent retention: Pancultured.  Cefepime.  4.  Severe sepsis with multiorgan failure secondary to the above: Supportive care, antibiotics.  5.  Hypoglycemia secondary to severe sepsis: Check cortisol level, glucose containing fluids.  Supportive care.  6.  Metabolic acidosis due to acute renal failure: Bicarbonate infusion, monitor.  7.  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  Monitor.  8.  Prophylaxis: Heparin subcu and PPI.  9.  COVID 19: negative, COVID ruled out.   Discussed case at length with patient's daughter, Mackie Holness ([phone]) via phone.  Total critical care time: 60 minutes.    Renold Don, MD [facility] 10/19/2018, 6:33 PM

## 2018-10-19 NOTE — Code Documentation (Signed)
Pt bagged d/t desat to 10%

## 2018-10-19 NOTE — ED Notes (Signed)
Report given to Tanya RN

## 2018-10-19 NOTE — ED Notes (Signed)
ED TO INPATIENT HANDOFF REPORT  ED Nurse Name and Phone #:  Ariel [phone]  S Name/Age/Gender [name] 63 y.o. male Room/Bed: ED06A/ED06A  Code Status   Code Status: Prior  Home/SNF/Other Nursing Home {Patient oriented [id number], unresponsive when he arrived.  Is this baseline? No   Triage Complete: Triage complete  Chief Complaint Hypoglycemia  Triage Note Pt arrives via [facility] after having some low blood sugars- pt had an IV in his right arm that infiltrated with potassium- pt had a cbg of 34 at facility- ems gave 250 of D10 and cbg increased to 184- pt was responsive yesterday but has had AMS since Tuesday- pt was found unresponsive this morning- per EMS eyes deviate to the right- pupils dilated- pt currently unresponsive and having labored breathing and sounds very gurgly- pt right arm swollen and hard from infiltrated potassium- last cbg was 113 per EMS   Allergies Allergies  Allergen Reactions  . Metformin Diarrhea    Level of Care/Admitting Diagnosis ED Disposition    ED Disposition Condition [facility]: [place] [100120]  Level of Care: ICU [6]  Covid Evaluation: N/A  Diagnosis: Sepsis ([place]) [[id number]]  Admitting Physician: Odessa Fleming  Attending Physician: Odessa Fleming  Estimated length of stay: past midnight tomorrow  Certification:: I certify this patient will need inpatient services for at least 2 midnights  PT Class (Do Not Modify): Inpatient [101]  PT Acc Code (Do Not Modify): Private [1]       B Medical/Surgery History Past Medical History:  Diagnosis Date  . Allergy   . Anxiety   . Cataract   . Cerebral hemorrhage ([place])   . Chest pain, atypical    12/2003:  negative cardiolyte  . Depression   . Drug abuse ([place])   . Eczema   . Erectile dysfunction   . History of meniscal tear    bilateral  . HLD (hyperlipidemia)   . HTN (hypertension)   . Hx of tear of ACL (anterior cruciate  ligament)    right  . Insomnia   . Olecranon bursitis of left elbow 10/2009   s/p I&D by Dr Maxie Better, initially assessed by Dr. Nori Riis   . Prediabetes   . Stroke ([place])   . Suicidal behavior 04/13/2017   Pt states taking a bunch of sleeping pills to end life   . TIA (transient ischemic attack)    Past Surgical History:  Procedure Laterality Date  . EP IMPLANTABLE DEVICE N/A 12/30/2015   Procedure: Loop Recorder Insertion;  Surgeon: Thompson Grayer, MD;  Location: [facility];  Service: Cardiovascular;  Laterality: N/A;  . ESOPHAGOGASTRODUODENOSCOPY N/A 10/03/2016   Procedure: ESOPHAGOGASTRODUODENOSCOPY (EGD);  Surgeon: Irene Shipper, MD;  Location: [facility] ENDOSCOPY;  Service: Endoscopy;  Laterality: N/A;  . INCISE AND DRAIN ABCESS     L elbow due to cellulitis/bursitis  . INNER EAR SURGERY    . IR GENERIC HISTORICAL  12/28/2015   IR ANGIO VERTEBRAL SEL VERTEBRAL UNI L MOD SED 12/28/2015 Luanne Bras, MD [facility]  . IR GENERIC HISTORICAL  12/28/2015   IR ANGIO VERTEBRAL SEL SUBCLAVIAN INNOMINATE UNI R MOD SED 12/28/2015 Luanne Bras, MD [facility]  . IR GENERIC HISTORICAL  12/28/2015   IR ANGIO INTRA EXTRACRAN SEL INTERNAL CAROTID BILAT MOD SED 12/28/2015 Luanne Bras, MD [facility]  . KNEE ARTHROSCOPY    . RADIOLOGY WITH ANESTHESIA N/A 09/20/2015   Procedure: EMBOLIZATION         (  RADIOLOGY WITH ANESTHESIA);  Surgeon: Luanne Bras, MD;  Location: [facility];  Service: Radiology;  Laterality: N/A;  . SKIN TAG REMOVAL     11 removed  . TEE WITHOUT CARDIOVERSION N/A 12/30/2015   Procedure: TRANSESOPHAGEAL ECHOCARDIOGRAM (TEE);  Surgeon: Larey Dresser, MD;  Location: [facility] ENDOSCOPY;  Service: Cardiovascular;  Laterality: N/A;     A IV Location/Drains/Wounds Patient Lines/Drains/Airways Status   Active Line/Drains/Airways    Name:   Placement date:   Placement time:   Site:   Days:   Peripheral IV 10/19/18 Left Antecubital   10/19/18    0901    Antecubital   less than 1    Peripheral IV 10/19/18 Left Arm   10/19/18    0933    Arm   less than 1   Peripheral IV 10/19/18 Left Forearm   10/19/18    1014    Forearm   less than 1   NG/OG Tube Orogastric 18 Fr. Left mouth Aucultation   10/19/18    0954    Left mouth   less than 1   Urethral Catheter Anda Kraft RN Temperature probe   10/19/18    1010    Temperature probe   less than 1   Airway 8 mm   10/19/18    0948     less than 1          Intake/Output Last 24 hours  Intake/Output Summary (Last 24 hours) at 10/19/2018 1146 Last data filed at 10/19/2018 1058 Gross per 24 hour  Intake 100 ml  Output 500 ml  Net -400 ml    Labs/Imaging Results for orders placed or performed during the hospital encounter of 10/19/18 (from the past 48 hour(s))  CBC with Differential     Status: Abnormal   Collection Time: 10/19/18  9:00 AM  Result Value Ref Range   WBC 20.7 (H) 4.0 - 10.5 K/uL   RBC 4.20 (L) 4.22 - 5.81 MIL/uL   Hemoglobin 11.4 (L) 13.0 - 17.0 g/dL   HCT 36.3 (L) 39.0 - 52.0 %   MCV 86.4 80.0 - 100.0 fL   MCH 27.1 26.0 - 34.0 pg   MCHC 31.4 30.0 - 36.0 g/dL   RDW 16.4 (H) 11.5 - 15.5 %   Platelets 310 150 - 400 K/uL   nRBC 0.0 0.0 - 0.2 %   Neutrophils Relative % 85 %   Neutro Abs 17.8 (H) 1.7 - 7.7 K/uL   Lymphocytes Relative 6 %   Lymphs Abs 1.2 0.7 - 4.0 K/uL   Monocytes Relative 7 %   Monocytes Absolute 1.4 (H) 0.1 - 1.0 K/uL   Eosinophils Relative 1 %   Eosinophils Absolute 0.1 0.0 - 0.5 K/uL   Basophils Relative 0 %   Basophils Absolute 0.1 0.0 - 0.1 K/uL   Immature Granulocytes 1 %   Abs Immature Granulocytes 0.12 (H) 0.00 - 0.07 K/uL    Comment: Performed at [facility], [address] [place], [place] [address]  Comprehensive metabolic panel     Status: Abnormal   Collection Time: 10/19/18  9:00 AM  Result Value Ref Range   Sodium 144 135 - 145 mmol/L   Potassium 5.8 (H) 3.5 - 5.1 mmol/L   Chloride 113 (H) 98 - 111 mmol/L   CO2 18 (L) 22 - 32 mmol/L   Glucose, Bld 146 (H) 70 -  99 mg/dL   BUN 86 (H) 8 - 23 mg/dL   Creatinine, Ser 8.53 (H) 0.61 -  1.24 mg/dL   Calcium 7.9 (L) 8.9 - 10.3 mg/dL   Total Protein 6.2 (L) 6.5 - 8.1 g/dL   Albumin 2.5 (L) 3.5 - 5.0 g/dL   AST 8 (L) 15 - 41 U/L   ALT 12 0 - 44 U/L   Alkaline Phosphatase 55 38 - 126 U/L   Total Bilirubin 0.7 0.3 - 1.2 mg/dL   GFR calc non Af Amer 6 (L) >60 mL/min   GFR calc Af Amer 7 (L) >60 mL/min   Anion gap 13 5 - 15    Comment: Performed at [facility], [address] [place], [place] [address]  Troponin I - ONCE - STAT     Status: None   Collection Time: 10/19/18  9:00 AM  Result Value Ref Range   Troponin I <0.03 <0.03 ng/mL    Comment: Performed at [facility], [address] [place], [place] [address]  SARS Coronavirus 2 (CEPHEID - Performed in [place] hospital lab), Hosp Order     Status: None   Collection Time: 10/19/18  9:28 AM  Result Value Ref Range   SARS Coronavirus 2 NEGATIVE NEGATIVE    Comment: (NOTE) If result is NEGATIVE SARS-CoV-2 target nucleic acids are NOT DETECTED. The SARS-CoV-2 RNA is generally detectable in upper and lower  respiratory specimens during the acute phase of infection. The lowest  concentration of SARS-CoV-2 viral copies this assay can detect is 250  copies / mL. A negative result does not preclude SARS-CoV-2 infection  and should not be used as the sole basis for treatment or other  patient management decisions.  A negative result may occur with  improper specimen collection / handling, submission of specimen other  than nasopharyngeal swab, presence of viral mutation(s) within the  areas targeted by this assay, and inadequate number of viral copies  (<250 copies / mL). A negative result must be combined with clinical  observations, patient history, and epidemiological information. If result is POSITIVE SARS-CoV-2 target nucleic acids are DETECTED. The SARS-CoV-2 RNA is generally detectable in upper and lower  respiratory  specimens dur ing the acute phase of infection.  Positive  results are indicative of active infection with SARS-CoV-2.  Clinical  correlation with patient history and other diagnostic information is  necessary to determine patient infection status.  Positive results do  not rule out bacterial infection or co-infection with other viruses. If result is PRESUMPTIVE POSTIVE SARS-CoV-2 nucleic acids MAY BE PRESENT.   A presumptive positive result was obtained on the submitted specimen  and confirmed on repeat testing.  While 2019 novel coronavirus  (SARS-CoV-2) nucleic acids may be present in the submitted sample  additional confirmatory testing may be necessary for epidemiological  and / or clinical management purposes  to differentiate between  SARS-CoV-2 and other Sarbecovirus currently known to infect humans.  If clinically indicated additional testing with an alternate test  methodology [phone]) is advised. The SARS-CoV-2 RNA is generally  detectable in upper and lower respiratory sp ecimens during the acute  phase of infection. The expected result is Negative. Fact Sheet for Patients:  StrictlyIdeas.no Fact Sheet for Healthcare Providers: BankingDealers.co.za This test is not yet approved or cleared by the [place] FDA and has been authorized for detection and/or diagnosis of SARS-CoV-2 by FDA under an Emergency Use Authorization (EUA).  This EUA will remain in effect (meaning this test can be used) for the duration of the COVID-19 declaration under Section 564(b)(1) of the Act, 21 U.S.C.  section 360bbb-3(b)(1), unless the authorization is terminated or revoked sooner. Performed at [facility], [address] [place], [place] [address]   Lactic acid, plasma     Status: None   Collection Time: 10/19/18  9:35 AM  Result Value Ref Range   Lactic Acid, Venous 1.0 0.5 - 1.9 mmol/L    Comment: Performed at [facility], [address] [place], [place] [address]  Urinalysis, Complete w Microscopic     Status: Abnormal   Collection Time: 10/19/18 10:18 AM  Result Value Ref Range   Color, Urine YELLOW (A) YELLOW   APPearance CLOUDY (A) CLEAR   Specific Gravity, Urine 1.018 1.005 - 1.030   pH 7.0 5.0 - 8.0   Glucose, UA NEGATIVE NEGATIVE mg/dL   Hgb urine dipstick SMALL (A) NEGATIVE   Bilirubin Urine NEGATIVE NEGATIVE   Ketones, ur NEGATIVE NEGATIVE mg/dL   Protein, ur >=300 (A) NEGATIVE mg/dL   Nitrite NEGATIVE NEGATIVE   Leukocytes,Ua TRACE (A) NEGATIVE   RBC / HPF >50 (H) 0 - 5 RBC/hpf   WBC, UA >50 (H) 0 - 5 WBC/hpf   Bacteria, UA MANY (A) NONE SEEN   Squamous Epithelial / LPF 0-5 0 - 5   WBC Clumps PRESENT    Mucus PRESENT     Comment: Performed at [facility] [address], [address] [place], [place] [address]  Blood gas, arterial     Status: Abnormal   Collection Time: 10/19/18 11:01 AM  Result Value Ref Range   FIO2 1.00    Delivery systems VENTILATOR    Mode ASSIST CONTROL    VT 500 mL   LHR 20 resp/min   Peep/cpap 10.0 cm H20   pH, Arterial 7.35 7.350 - 7.450   pCO2 arterial 35 32.0 - 48.0 mmHg   pO2, Arterial 489 (H) 83.0 - 108.0 mmHg   Bicarbonate 19.3 (L) 20.0 - 28.0 mmol/L   Acid-base deficit 5.6 (H) 0.0 - 2.0 mmol/L   O2 Saturation 100.0 %   Patient temperature 37.0    Collection site RIGHT RADIAL    Sample type ARTERIAL DRAW    Allens test (pass/fail) ARTERIAL DRAW (A) PASS    Comment: Performed at [facility], [address], [place], [place] [address]   Dg Chest 1 View  Result Date: 10/19/2018 CLINICAL DATA:  Encounter for evaluation of ET and OG tube placement. EXAM: CHEST  1 VIEW COMPARISON:  04/02/2017 FINDINGS: Endotracheal tube has been placed with the tip approximately 4.5 cm above carina. Gastric tube extends to the decompressed stomach. Implanted event monitor overlies the left chest. Relatively low lung volumes with no focal infiltrate  or overt edema. Heart size and mediastinal contours are within normal limits. No effusion. Visualized bones unremarkable. IMPRESSION: 1. Endotracheal tube and gastric tube placement as above. 2. No acute cardiopulmonary disease. Electronically Signed   By: Lucrezia Europe M.D.   On: 10/19/2018 11:02   Ct Head Wo Contrast  Result Date: 10/19/2018 CLINICAL DATA:  Altered level of consciousness. EXAM: CT HEAD WITHOUT CONTRAST TECHNIQUE: Contiguous axial images were obtained from the base of the skull through the vertex without intravenous contrast. COMPARISON:  CT head 04/13/2017 FINDINGS: Brain: Image quality degraded by motion and streak artifact from metal devices. 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 Interval left frontal craniotomy. New aneurysm clip in the region of the left A2 segment. Previously identified pipeline stent in the left A2 segment. Negative for acute hemorrhage, acute infarct, or  mass lesion. Vascular: Negative for hyperdense vessel. 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 Skull: Interval left frontal craniotomy. 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 Chronic left mastoidectomy. Mucosal edema in the mastoidectomy cavity. Mucosal edema paranasal sinuses.  Negative orbit Other: None IMPRESSION: Left frontal craniotomy. Encephalomalacia in the anterior frontal 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 No acute hemorrhage or infarction. Electronically Signed   By: Franchot Gallo M.D.   On: 10/19/2018 09:37    Pending Labs Unresulted Labs (From admission, onward)    Start     Ordered   10/19/18 1133  MRSA PCR Screening  Once,   STAT     10/19/18 1132   10/19/18 0902  Blood gas, venous  ONCE - STAT,   STAT     10/19/18 0901   10/19/18 0902  Blood culture (routine x 2)  BLOOD CULTURE X 2,   STAT     10/19/18 0902   Signed and Held  CBC  (heparin)  Once,   R     Comments:  Baseline for heparin therapy IF NOT ALREADY DRAWN.  Notify MD if PLT < 100 K.    Signed and Held   Signed and Held  Creatinine, serum  (heparin)  Once,   R    Comments:  Baseline for heparin therapy IF NOT ALREADY DRAWN.    Signed and Held   Signed and Held  Basic metabolic panel  Now then every 4 hours,   R     Signed and Held   Signed and Held  CBC  Tomorrow morning,   R     Signed and Held          Vitals/Pain Today's Vitals   10/19/18 1110 10/19/18 1120 10/19/18 1130 10/19/18 1140  BP: 110/75 104/82 (!) 105/59 101/65  Pulse: (!) 108 (!) 110 (!) 106 (!) 105  Resp: 19 20 20 19   Temp: 99.6 F (37.6 C) 99.6 F (37.6 C) 99.5 F (37.5 C) 99.6 F (37.6 C)  TempSrc:      SpO2: 99% 98% 98% 98%  Weight:      Height:        Isolation Precautions No active isolations  Medications Medications  vancomycin (VANCOCIN) IVPB 1000 mg/200 mL premix (1,000 mg Intravenous New Bag/Given 10/19/18 1054)  propofol (DIPRIVAN) 1000 MG/100ML infusion (10 mcg/kg/min  120.6 kg Intravenous Rate/Dose Change 10/19/18 1056)  calcium gluconate 1 g/ 50 mL sodium chloride IVPB (has no administration in time range)  sodium bicarbonate 100 mEq in dextrose 5 % 1,000 mL infusion (has no administration in time range)  insulin aspart (novoLOG) injection 10 Units (has no administration in time range)  dextrose 50 % solution 25 mL (has no administration in time range)  piperacillin-tazobactam (ZOSYN) IVPB 3.375 g (0 g Intravenous Stopped 10/19/18 1050)  etomidate (AMIDATE) injection (20 mg Intravenous Given 10/19/18 0946)  succinylcholine (ANECTINE) injection (100 mg Intravenous Given 10/19/18 0947)    Mobility Uknown if ambulates or uses wheelchair Moderate fall risk   Focused Assessments    R Recommendations: See Admitting Provider Note  Report given to:   Additional Notes:

## 2018-10-19 NOTE — Code Documentation (Signed)
OG verbally ordered by Dr Jimmye Norman

## 2018-10-19 NOTE — Code Documentation (Signed)
Pt orally suctioned.

## 2018-10-19 NOTE — Code Documentation (Signed)
Pt desat to 79%- pt bagged and came back to 92%- vent settings changed

## 2018-10-19 NOTE — ED Provider Notes (Addendum)
[facility] Emergency Department Provider Note       Time seen: ----------------------------------------- 9:01 AM on 10/19/2018 -----------------------------------------  Level V caveat: History/ROS limited by altered mental status I have reviewed the triage vital signs and the nursing notes.  HISTORY   Chief Complaint Hypoglycemia    HPI [name] is a 63 y.o. male with a history of brain hemorrhage, depression, drug abuse, hyperlipidemia, hypertension, CVA, suicidal behavior who presents to the ED for altered mental status.  Reportedly he had low blood sugars as well, treated with D50 in route.  He had an IV in his right arm that was infiltrated where he was receiving IV potassium.  CBG had improved prior to arrival but he remained unresponsive.  Patient comes from a nursing facility, reportedly he has been altered since last night.  Yesterday he was not eating well, condition was noted to be worse this morning.  No further information is available.  Past Medical History:  Diagnosis Date  . Allergy   . Anxiety   . Cataract   . Cerebral hemorrhage ([place])   . Chest pain, atypical    12/2003:  negative cardiolyte  . Depression   . Drug abuse ([place])   . Eczema   . Erectile dysfunction   . History of meniscal tear    bilateral  . HLD (hyperlipidemia)   . HTN (hypertension)   . Hx of tear of ACL (anterior cruciate ligament)    right  . Insomnia   . Olecranon bursitis of left elbow 10/2009   s/p I&D by Dr Maxie Better, initially assessed by Dr. Nori Riis   . Prediabetes   . Stroke ([place])   . Suicidal behavior 04/13/2017   Pt states taking a bunch of sleeping pills to end life   . TIA (transient ischemic attack)     Patient Active Problem List   Diagnosis Date Noted  . Severe major depression ([place]) 04/14/2017  . Major depressive disorder, recurrent ([place]) 04/09/2017  . MDD (major depressive disorder), recurrent severe, without psychosis ([place]) 04/09/2017  .  Family history of brain aneurysm   . Intractable vomiting 10/20/2016  . History of intracranial aneurysm   . Hypokalemia   . AKI (acute kidney injury) ([place])   . Prolonged QT interval   . Lactic acidosis   . Dysphagia   . Esophageal ring   . Gastroesophageal reflux disease with esophagitis   . Nausea and vomiting 10/01/2016  . 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 Severe episode of recurrent major depressive disorder, without psychotic features ([place]) 07/20/2016  . CVA (cerebral vascular accident) ([place]) 06/15/2016  . Abdominal pain   . Numbness   . Carpal tunnel syndrome, bilateral 12/16/2015  . Status post stroke 11/12/2015  . Transient neurologic deficit 10/15/2015  . Cerebrovascular accident, late effects 10/07/2015  . Complicated migraine   . Anxiety state   . 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 Cerebral infarction due to embolism of right middle cerebral artery ([place]) 09/23/2015  . Cerebral infarction due to embolism of cerebral artery ([place]) 09/23/2015  . Gait disturbance, post-stroke   . Drooping of mouth   . Cerebrovascular accident (CVA) due to embolism of right anterior cerebral artery ([place])   . Benign essential HTN   . Migraine with aura and without status migrainosus, not intractable   . Tachypnea   . Prediabetes   . Acute blood loss anemia   . Brain aneurysm 09/20/2015  . Type 2 diabetes mellitus ([place]) 09/15/2015  . Leukocytosis  09/15/2015  . Aneurysm, cerebral, nonruptured 09/15/2015  . TIA (transient ischemic attack) 09/14/2015  . Temporary cerebral vascular dysfunction 09/14/2015  . H/O transient cerebral ischemia 07/29/2015  . At risk for falling 07/29/2015  . Bilateral hearing loss 10/25/2014  . Abnormal fear 08/08/2012  . Headache, migraine 08/08/2012  . Arthritis, degenerative 08/08/2012  . History of tear of ACL (anterior cruciate ligament) 03/08/2011  . History of knee problem 03/08/2011  . Depression 09/07/2010  . Depressive disorder, not elsewhere  classified 09/07/2010  . URI (upper respiratory infection) 08/24/2010  . Hearing loss 09/06/2006  . Hyperlipidemia 04/23/2006  . Essential hypertension 04/23/2006  . Psychophysiological insomnia 04/23/2006    Past Surgical History:  Procedure Laterality Date  . EP IMPLANTABLE DEVICE N/A 12/30/2015   Procedure: Loop Recorder Insertion;  Surgeon: Thompson Grayer, MD;  Location: [facility];  Service: Cardiovascular;  Laterality: N/A;  . ESOPHAGOGASTRODUODENOSCOPY N/A 10/03/2016   Procedure: ESOPHAGOGASTRODUODENOSCOPY (EGD);  Surgeon: Irene Shipper, MD;  Location: [facility] ENDOSCOPY;  Service: Endoscopy;  Laterality: N/A;  . INCISE AND DRAIN ABCESS     L elbow due to cellulitis/bursitis  . INNER EAR SURGERY    . IR GENERIC HISTORICAL  12/28/2015   IR ANGIO VERTEBRAL SEL VERTEBRAL UNI L MOD SED 12/28/2015 Luanne Bras, MD [facility]INTER[facility]  . IR GENERIC HISTORICAL  12/28/2015   IR ANGIO VERTEBRAL SEL SUBCLAVIAN INNOMINATE UNI R MOD SED 12/28/2015 Luanne Bras, MD [facility]  . IR GENERIC HISTORICAL  12/28/2015   IR ANGIO INTRA EXTRACRAN SEL INTERNAL CAROTID BILAT MOD SED 12/28/2015 Luanne Bras, MD [facility]  . KNEE ARTHROSCOPY    . RADIOLOGY WITH ANESTHESIA N/A 09/20/2015   Procedure: EMBOLIZATION         (RADIOLOGY WITH ANESTHESIA);  Surgeon: Luanne Bras, MD;  Location: [facility];  Service: Radiology;  Laterality: N/A;  . SKIN TAG REMOVAL     11 removed  . TEE WITHOUT CARDIOVERSION N/A 12/30/2015   Procedure: TRANSESOPHAGEAL ECHOCARDIOGRAM (TEE);  Surgeon: Larey Dresser, MD;  Location: [facility] ENDOSCOPY;  Service: Cardiovascular;  Laterality: N/A;    Allergies Metformin  Social History Social History   Tobacco Use  . Smoking status: Never Smoker  . Smokeless tobacco: Never Used  Substance Use Topics  . Alcohol use: No    Alcohol/week: 0.0 standard drinks  . Drug use: No    Review of Systems Unknown, patient with altered mental status  All systems  negative/normal/unremarkable except as stated in the HPI  ____________________________________________   PHYSICAL EXAM:  VITAL SIGNS: ED Triage Vitals  Enc Vitals Group     BP      Pulse      Resp      Temp      Temp src      SpO2      Weight      Height      Head Circumference      Peak Flow      Pain Score      Pain Loc      Pain Edu?      Excl. in [place]?     Constitutional: Unresponsive, mild distress Eyes: Conjunctivae are normal. Right gaze is present ENT      Head: Normocephalic and atraumatic.      Nose: No congestion/rhinnorhea.      Mouth/Throat: Mucous membranes are moist.      Neck: No stridor. Cardiovascular: Rapid rate, regular rhythm. No murmurs, rubs, or gallops. Respiratory: Sonorous respirations,  scattered crackles Gastrointestinal: Normal bowel sounds Musculoskeletal: Mild edema is noted Neurologic: Patient arrives unresponsive even to pain, GCS 3 Skin:  Skin is warm, dry and intact. No rash noted. Psychiatric: Unable to assess ____________________________________________  EKG: Interpreted by me.  It is tachycardia with a rate of 110 bpm, PVC, right axis deviation, low voltage, ST segment changes are noted, normal QT  ____________________________________________  ED COURSE:  As part of my medical decision making, I reviewed the following data within the [facility] History obtained from family if available, nursing notes, old chart and ekg, as well as notes from prior ED visits. Patient presented for altered mental status, we will assess with labs and imaging as indicated at this time.   Procedure Name: Intubation Date/Time: 10/19/2018 10:25 AM Performed by: Earleen Newport, MD Pre-anesthesia Checklist: Patient identified, Patient being monitored, Emergency Drugs available, Timeout performed and Suction available Oxygen Delivery Method: Non-rebreather mask Preoxygenation: Pre-oxygenation with 100% oxygen Induction Type: Rapid  sequence Ventilation: Mask ventilation without difficulty Laryngoscope Size: Mac and 4 Tube size: 8.0 mm Number of attempts: 1 Placement Confirmation: ETT inserted through vocal cords under direct vision,  CO2 detector and Breath sounds checked- equal and bilateral Secured at: 24 cm Tube secured with: ETT holder Dental Injury: Teeth and Oropharynx as per pre-operative assessment  Difficulty Due To: Difficulty was unanticipated       [name] was evaluated in Emergency Department on 10/19/2018 for the symptoms described in the history of present illness. He was evaluated in the context of the global COVID-19 pandemic, which necessitated consideration that the patient might be at risk for infection with the SARS-CoV-2 virus that causes COVID-19. Institutional protocols and algorithms that pertain to the evaluation of patients at risk for COVID-19 are in a state of rapid change based on information released by regulatory bodies including the CDC and federal and state organizations. These policies and algorithms were followed during the patient's care in the ED.  ____________________________________________   LABS (pertinent positives/negatives)  Labs Reviewed  CBC WITH DIFFERENTIAL/PLATELET - Abnormal; Notable for the following components:      Result Value   WBC 20.7 (*)    RBC 4.20 (*)    Hemoglobin 11.4 (*)    HCT 36.3 (*)    RDW 16.4 (*)    Neutro Abs 17.8 (*)    Monocytes Absolute 1.4 (*)    Abs Immature Granulocytes 0.12 (*)    All other components within normal limits  COMPREHENSIVE METABOLIC PANEL - Abnormal; Notable for the following components:   Potassium 5.8 (*)    Chloride 113 (*)    CO2 18 (*)    Glucose, Bld 146 (*)    BUN 86 (*)    Creatinine, Ser 8.53 (*)    Calcium 7.9 (*)    Total Protein 6.2 (*)    Albumin 2.5 (*)    AST 8 (*)    GFR calc non Af Amer 6 (*)    GFR calc Af Amer 7 (*)    All other components within normal limits  URINALYSIS, COMPLETE  (UACMP) WITH MICROSCOPIC - Abnormal; Notable for the following components:   Color, Urine YELLOW (*)    APPearance CLOUDY (*)    Hgb urine dipstick SMALL (*)    Protein, ur >=300 (*)    Leukocytes,Ua TRACE (*)    RBC / HPF >50 (*)    WBC, UA >50 (*)    Bacteria, UA MANY (*)  All other components within normal limits  SARS CORONAVIRUS 2 (HOSPITAL ORDER, [facility])  CULTURE, BLOOD (ROUTINE X 2)  CULTURE, BLOOD (ROUTINE X 2)  TROPONIN I  LACTIC ACID, PLASMA  BLOOD GAS, VENOUS  BLOOD GAS, ARTERIAL  CBG MONITORING, ED   CRITICAL CARE Performed by: Laurence Aly   Total critical care time: 30 minutes  Critical care time was exclusive of separately billable procedures and treating other patients.  Critical care was necessary to treat or prevent imminent or life-threatening deterioration.  Critical care was time spent personally by me on the following activities: development of treatment plan with patient and/or surrogate as well as nursing, discussions with consultants, evaluation of patient's response to treatment, examination of patient, obtaining history from patient or surrogate, ordering and performing treatments and interventions, ordering and review of laboratory studies, ordering and review of radiographic studies, pulse oximetry and re-evaluation of patient's condition.  RADIOLOGY Images were viewed by me  Chest x-ray, CT head IMPRESSION: Left frontal craniotomy. Encephalomalacia in the anterior frontal 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  No acute hemorrhage or infarction. ____________________________________________   DIFFERENTIAL DIAGNOSIS   CVA, cerebral hemorrhage, sepsis, coronavirus, dehydration, electrolyte abnormality  FINAL ASSESSMENT AND PLAN  Altered mental status, acute renal failure, acute urinary retention   Plan: The patient had presented for altered mental status.  Patient's labs did indicate acute renal failure with a creatinine of 8.53, hyperkalemia with a potassium of 5.8. Patient's imaging was negative regarding his CT head and chest x-ray.  Likely etiology for his symptoms are urosepsis and uremia from acute urinary retention.  We have started IV fluids and broad-spectrum antibiotics.  We have decompressed his bladder with a Foley catheter.  I will discuss with the hospitalist for ICU admission.   Laurence Aly, MD    Note: This note was generated in part or whole with voice recognition software. Voice recognition is usually quite accurate but there are transcription errors that can and very often do occur. I apologize for any typographical errors that were not detected and corrected.     Earleen Newport, MD 10/19/18 1049    Earleen Newport, MD 10/19/18 1058

## 2018-10-19 NOTE — Progress Notes (Signed)
Patient intubated.  Very clammy.  Temp 38.2 C.  Cooling blanket applied.  CBG 55.  12.5 mg Dextrose given per protocol.  Increased to 99.  Patient remains unresponsive.  Gag reflex. Updated daughter [name]) and sister [name]).  Patient MRSA +.. Report given to oncoming nurse, Eustaquio Maize, RN. Will continue to monitor.

## 2018-10-19 NOTE — Code Documentation (Signed)
Pt back to 100% after bagging

## 2018-10-20 LAB — GLUCOSE, CAPILLARY
Glucose-Capillary: 168 mg/dL — ABNORMAL HIGH (ref 70–99)
Glucose-Capillary: 188 mg/dL — ABNORMAL HIGH (ref 70–99)
Glucose-Capillary: 199 mg/dL — ABNORMAL HIGH (ref 70–99)
Glucose-Capillary: 199 mg/dL — ABNORMAL HIGH (ref 70–99)
Glucose-Capillary: 203 mg/dL — ABNORMAL HIGH (ref 70–99)
Glucose-Capillary: 209 mg/dL — ABNORMAL HIGH (ref 70–99)

## 2018-10-20 LAB — COMPREHENSIVE METABOLIC PANEL
ALT: 11 U/L (ref 0–44)
AST: 9 U/L — ABNORMAL LOW (ref 15–41)
Albumin: 2.8 g/dL — ABNORMAL LOW (ref 3.5–5.0)
Alkaline Phosphatase: 50 U/L (ref 38–126)
Anion gap: 12 (ref 5–15)
BUN: 77 mg/dL — ABNORMAL HIGH (ref 8–23)
CO2: 25 mmol/L (ref 22–32)
Calcium: 8.1 mg/dL — ABNORMAL LOW (ref 8.9–10.3)
Chloride: 114 mmol/L — ABNORMAL HIGH (ref 98–111)
Creatinine, Ser: 6.98 mg/dL — ABNORMAL HIGH (ref 0.61–1.24)
GFR calc Af Amer: 9 mL/min — ABNORMAL LOW (ref >60)
GFR calc non Af Amer: 8 mL/min — ABNORMAL LOW (ref >60)
Glucose, Bld: 190 mg/dL — ABNORMAL HIGH (ref 70–99)
Potassium: 4.7 mmol/L (ref 3.5–5.1)
Sodium: 151 mmol/L — ABNORMAL HIGH (ref 135–145)
Total Bilirubin: 0.6 mg/dL (ref 0.3–1.2)
Total Protein: 5.9 g/dL — ABNORMAL LOW (ref 6.5–8.1)

## 2018-10-20 LAB — MAGNESIUM: Magnesium: 2.2 mg/dL (ref 1.7–2.4)

## 2018-10-20 LAB — CBC
HCT: 30.1 % — ABNORMAL LOW (ref 39.0–52.0)
Hemoglobin: 9.2 g/dL — ABNORMAL LOW (ref 13.0–17.0)
MCH: 27 pg (ref 26.0–34.0)
MCHC: 30.6 g/dL (ref 30.0–36.0)
MCV: 88.3 fL (ref 80.0–100.0)
Platelets: 261 10*3/uL (ref 150–400)
RBC: 3.41 MIL/uL — ABNORMAL LOW (ref 4.22–5.81)
RDW: 16.5 % — ABNORMAL HIGH (ref 11.5–15.5)
WBC: 17.7 10*3/uL — ABNORMAL HIGH (ref 4.0–10.5)
nRBC: 0 % (ref 0.0–0.2)

## 2018-10-20 LAB — PROCALCITONIN: Procalcitonin: 13.7 ng/mL

## 2018-10-20 LAB — PHOSPHORUS: Phosphorus: 5.9 mg/dL — ABNORMAL HIGH (ref 2.5–4.6)

## 2018-10-20 LAB — CORTISOL: Cortisol, Plasma: 19.7 ug/dL

## 2018-10-20 MED ORDER — CHLORHEXIDINE GLUCONATE 0.12% ORAL RINSE (MEDLINE KIT)
15.0000 mL | Freq: Two times a day (BID) | OROMUCOSAL | Status: DC
  Administered 2018-10-20 – 2018-10-25 (×11): 15 mL via OROMUCOSAL

## 2018-10-20 MED ORDER — CHLORHEXIDINE GLUCONATE CLOTH 2 % EX PADS: 6.0000 | MEDICATED_PAD | Freq: Every day | CUTANEOUS | Status: DC

## 2018-10-20 MED ORDER — LACTATED RINGERS IV BOLUS
500.0000 mL | Freq: Once | INTRAVENOUS | Status: AC
  Administered 2018-10-20: 500 mL via INTRAVENOUS

## 2018-10-20 MED ORDER — FREE WATER
200.0000 mL | Status: DC
  Administered 2018-10-20 – 2018-10-25 (×31): 200 mL

## 2018-10-20 MED ORDER — SODIUM CHLORIDE 0.9 % IV SOLN
0.0000 ug/min | INTRAVENOUS | Status: DC
  Administered 2018-10-20: 40 ug/min via INTRAVENOUS
  Administered 2018-10-20 (×2): 20 ug/min via INTRAVENOUS
  Administered 2018-10-20: 45 ug/min via INTRAVENOUS
  Filled 2018-10-20: qty 10
  Filled 2018-10-20: qty 1
  Filled 2018-10-20 (×2): qty 10
  Filled 2018-10-20: qty 1

## 2018-10-20 MED ORDER — SODIUM CHLORIDE 0.9 % IV BOLUS
1000.0000 mL | Freq: Once | INTRAVENOUS | Status: AC
  Administered 2018-10-20: 1000 mL via INTRAVENOUS

## 2018-10-20 MED ORDER — PHENYLEPHRINE HCL-NACL 10-0.9 MG/250ML-% IV SOLN
0.0000 ug/min | INTRAVENOUS | Status: DC
  Filled 2018-10-20: qty 250

## 2018-10-20 MED ORDER — HYDROCORTISONE NA SUCCINATE PF 100 MG IJ SOLR
50.0000 mg | Freq: Three times a day (TID) | INTRAMUSCULAR | Status: DC
  Administered 2018-10-20 – 2018-10-21 (×3): 50 mg via INTRAVENOUS
  Filled 2018-10-20 (×3): qty 2

## 2018-10-20 MED ORDER — ORAL CARE MOUTH RINSE
15.0000 mL | OROMUCOSAL | Status: DC
  Administered 2018-10-20 – 2018-10-25 (×53): 15 mL via OROMUCOSAL

## 2018-10-20 MED ORDER — SODIUM CHLORIDE 0.9 % IV SOLN
1.0000 g | Freq: Two times a day (BID) | INTRAVENOUS | Status: DC
  Administered 2018-10-20 – 2018-10-21 (×3): 1 g via INTRAVENOUS
  Filled 2018-10-20 (×4): qty 1

## 2018-10-20 NOTE — Progress Notes (Signed)
Pharmacy Antibiotic Note  [name] is a 63 y.o. male admitted on 10/19/2018 with UTI.  PMH significant for CVA, HTN, tracheostomy. Patient presented with low blood sugar and was unresponsive. Baseline Scr from [facility] in 07/2018 was ~0.55.  Pharmacy has been consulted for Cefepime dosing.  Plan: Renal function improved, will transition patient to cefepime 1g IV Q12hr. Will monitor serum creatinine daily during acute renal failure.   Height: 6' (182.9 cm) Weight: 242 lb 11.6 oz (110.1 kg) IBW/kg (Calculated) : 77.6  Temp (24hrs), Avg:99.7 F (37.6 C), Min:98.1 F (36.7 C), Max:102 F (38.9 C)  Recent Labs  Lab 10/19/18 0900 10/19/18 0935 10/19/18 1357 10/19/18 1636 10/19/18 2025 10/19/18 2306 10/20/18 0438  WBC 20.7*  --  15.5*  --   --   --  17.7*  CREATININE 8.53*  --  8.30* 8.02* 7.78* 7.59* 6.98*  LATICACIDVEN  --  1.0  --   --   --   --   --     Estimated Creatinine Clearance: 13.7 mL/min (A) (by C-G formula based on SCr of 6.98 mg/dL (H)).    Allergies  Allergen Reactions  . Metformin Diarrhea    Antimicrobials this admission: 5/23 Vancomycin x 1 5/23 Zosyn x 1 5/23 Cefepime >>  Dose adjustments this admission: 5/24 Cefepime transitioned to   Microbiology results: 5/23 BCx: pending    5/23 MRSA PCR: pending 5/23 COVID-19 (-)  Thank you for allowing pharmacy to be a part of this patient's care.  Simpson,Michael L, 10/20/2018 11:06 AM

## 2018-10-20 NOTE — Progress Notes (Signed)
[facility]  ROUNDING NOTE   Subjective:  Patient seen at bedside. Remains critically ill. Still on the ventilator.  Cr down to 6.98. UOP 2.7 liters over the preceding 24 hours.    Objective:  Vital signs in last 24 hours:  Temp:  [98.1 F (36.7 C)-102 F (38.9 C)] 98.7 F (37.1 C) (05/24 0400) Pulse Rate:  [87-113] 87 (05/24 0600) Resp:  [16-24] 16 (05/24 0600) BP: (91-146)/(49-101) 91/49 (05/24 0600) SpO2:  [96 %-100 %] 97 % (05/24 0600) FiO2 (%):  [35 %-40 %] 35 % (05/24 0720) Weight:  [110.1 kg] 110.1 kg (05/23 1309)  Weight change:  Filed Weights   10/19/18 0911 10/19/18 1022 10/19/18 1309  Weight: 113.4 kg 120.6 kg 110.1 kg    Intake/Output: I/O last 3 completed shifts: In: 3123.1 [I.V.:2074.5; IV Piggyback:1048.6] Out: 2790 [Urine:2790]   Intake/Output this shift:  Total I/O In: 154.9 [I.V.:154.9] Out: -   Physical Exam: General: No acute distress  Head: Normocephalic, atraumatic. Moist oral mucosal membranes  Eyes: Anicteric  Neck: Supple, trachea midline  Lungs:  Clear to auscultation, normal effort  Heart: S1S2 no rubs  Abdomen:  Soft, nontender, bowel sounds present  Extremities:  peripheral edema.  Neurologic: Awake, alert, following commands  Skin: No lesions  Access:     Basic Metabolic Panel: Recent Labs  Lab 10/19/18 1357 10/19/18 1636 10/19/18 2025 10/19/18 2306 10/20/18 0438  NA 147* 146* 148* 148* 151*  K 5.4* 5.0 5.2* 4.9 4.7  CL 114* 114* 116* 115* 114*  CO2 19* 19* 21* 21* 25  GLUCOSE 68* 61* 99 123* 190*  BUN 83* 84* 81* 80* 77*  CREATININE 8.30* 8.02* 7.78* 7.59* 6.98*  CALCIUM 7.8* 7.8* 8.0* 7.8* 8.1*  MG  --   --   --  2.3 2.2  PHOS  --   --  6.2* 5.8* 5.9*    Liver Function Tests: Recent Labs  Lab 10/19/18 0900 10/19/18 2025 10/20/18 0438  AST 8*  --  9*  ALT 12  --  11  ALKPHOS 55  --  50  BILITOT 0.7  --  0.6  PROT 6.2*  --  5.9*  ALBUMIN 2.5* 2.5* 2.8*   No results for input(s): LIPASE,  AMYLASE in the last 168 hours. No results for input(s): AMMONIA in the last 168 hours.  CBC: Recent Labs  Lab 10/19/18 0900 10/19/18 1357 10/20/18 0438  WBC 20.7* 15.5* 17.7*  NEUTROABS 17.8*  --   --   HGB 11.4* 9.8* 9.2*  HCT 36.3* 33.4* 30.1*  MCV 86.4 91.5 88.3  PLT 310 187 261    Cardiac Enzymes: Recent Labs  Lab 10/19/18 0900  TROPONINI <0.03    BNP: Invalid input(s): POCBNP  CBG: Recent Labs  Lab 10/19/18 1932 10/19/18 2104 10/19/18 2319 10/20/18 0416 10/20/18 0732  GLUCAP 89 94 119* 168* 199*    Microbiology: Results for orders placed or performed during the hospital encounter of 10/19/18  SARS Coronavirus 2 (CEPHEID - Performed in [place] hospital lab), Hosp Order     Status: None   Collection Time: 10/19/18  9:28 AM  Result Value Ref Range Status   SARS Coronavirus 2 NEGATIVE NEGATIVE Final    Comment: (NOTE) If result is NEGATIVE SARS-CoV-2 target nucleic acids are NOT DETECTED. The SARS-CoV-2 RNA is generally detectable in upper and lower  respiratory specimens during the acute phase of infection. The lowest  concentration of SARS-CoV-2 viral copies this assay can detect is 250  copies /  mL. A negative result does not preclude SARS-CoV-2 infection  and should not be used as the sole basis for treatment or other  patient management decisions.  A negative result may occur with  improper specimen collection / handling, submission of specimen other  than nasopharyngeal swab, presence of viral mutation(s) within the  areas targeted by this assay, and inadequate number of viral copies  (<250 copies / mL). A negative result must be combined with clinical  observations, patient history, and epidemiological information. If result is POSITIVE SARS-CoV-2 target nucleic acids are DETECTED. The SARS-CoV-2 RNA is generally detectable in upper and lower  respiratory specimens dur ing the acute phase of infection.  Positive  results are indicative of  active infection with SARS-CoV-2.  Clinical  correlation with patient history and other diagnostic information is  necessary to determine patient infection status.  Positive results do  not rule out bacterial infection or co-infection with other viruses. If result is PRESUMPTIVE POSTIVE SARS-CoV-2 nucleic acids MAY BE PRESENT.   A presumptive positive result was obtained on the submitted specimen  and confirmed on repeat testing.  While 2019 novel coronavirus  (SARS-CoV-2) nucleic acids may be present in the submitted sample  additional confirmatory testing may be necessary for epidemiological  and / or clinical management purposes  to differentiate between  SARS-CoV-2 and other Sarbecovirus currently known to infect humans.  If clinically indicated additional testing with an alternate test  methodology [phone]) is advised. The SARS-CoV-2 RNA is generally  detectable in upper and lower respiratory sp ecimens during the acute  phase of infection. The expected result is Negative. Fact Sheet for Patients:  StrictlyIdeas.no Fact Sheet for Healthcare Providers: BankingDealers.co.za This test is not yet approved or cleared by the [place] FDA and has been authorized for detection and/or diagnosis of SARS-CoV-2 by FDA under an Emergency Use Authorization (EUA).  This EUA will remain in effect (meaning this test can be used) for the duration of the COVID-19 declaration under Section 564(b)(1) of the Act, 21 U.S.C. section 360bbb-3(b)(1), unless the authorization is terminated or revoked sooner. Performed at [facility], [address] [place], [place] [address]   Blood culture (routine x 2)     Status: None (Preliminary result)   Collection Time: 10/19/18  9:36 AM  Result Value Ref Range Status   Specimen Description BLOOD LUA  Final   Special Requests   Final    BOTTLES DRAWN AEROBIC AND ANAEROBIC Blood Culture adequate  volume   Culture   Final    NO GROWTH < 24 HOURS Performed at [facility], [address], [place], [place] [address]    Report Status PENDING  Incomplete  Blood culture (routine x 2)     Status: None (Preliminary result)   Collection Time: 10/19/18 10:16 AM  Result Value Ref Range Status   Specimen Description BLOOD LFA  Final   Special Requests   Final    BOTTLES DRAWN AEROBIC AND ANAEROBIC Blood Culture results may not be optimal due to an inadequate volume of blood received in culture bottles   Culture   Final    NO GROWTH < 24 HOURS Performed at [facility], [address], [place],  [address]    Report Status PENDING  Incomplete  MRSA PCR Screening     Status: Abnormal   Collection Time: 10/19/18  1:06 PM  Result Value Ref Range Status   MRSA by PCR POSITIVE (A) NEGATIVE Final  Comment:        The GeneXpert MRSA Assay (FDA approved for NASAL specimens only), is one component of a comprehensive MRSA colonization surveillance program. It is not intended to diagnose MRSA infection nor to guide or monitor treatment for MRSA infections. CRITICAL RESULT CALLED TO, READ BACK BY AND VERIFIED WITH: CALLED TO TANYA SILVA '@[id number]'$  10/19/2018 [facility] Performed at [facility], [address] [place], [place] [address]     Coagulation Studies: No results for input(s): LABPROT, INR in the last 72 hours.  Urinalysis: Recent Labs    10/19/18 1018  COLORURINE YELLOW*  LABSPEC 1.018  PHURINE 7.0  GLUCOSEU NEGATIVE  HGBUR SMALL*  BILIRUBINUR NEGATIVE  KETONESUR NEGATIVE  PROTEINUR >=300*  NITRITE NEGATIVE  LEUKOCYTESUR TRACE*      Imaging: Dg Chest 1 View  Result Date: 10/19/2018 CLINICAL DATA:  Encounter for evaluation of ET and OG tube placement. EXAM: CHEST  1 VIEW COMPARISON:  04/02/2017 FINDINGS: Endotracheal tube has been placed with the tip approximately 4.5 cm above carina. Gastric tube extends to the decompressed stomach.  Implanted event monitor overlies the left chest. Relatively low lung volumes with no focal infiltrate or overt edema. Heart size and mediastinal contours are within normal limits. No effusion. Visualized bones unremarkable. IMPRESSION: 1. Endotracheal tube and gastric tube placement as above. 2. No acute cardiopulmonary disease. Electronically Signed   By: Lucrezia Europe M.D.   On: 10/19/2018 11:02   Ct Head Wo Contrast  Result Date: 10/19/2018 CLINICAL DATA:  Altered level of consciousness. EXAM: CT HEAD WITHOUT CONTRAST TECHNIQUE: Contiguous axial images were obtained from the base of the skull through the vertex without intravenous contrast. COMPARISON:  CT head 04/13/2017 FINDINGS: Brain: Image quality degraded by motion and streak artifact from metal devices. 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 Interval left frontal craniotomy. New aneurysm clip in the region of the left A2 segment. Previously identified pipeline stent in the left A2 segment. Negative for acute hemorrhage, acute infarct, or mass lesion. Vascular: Negative for hyperdense vessel. 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 Skull: Interval left frontal craniotomy. 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 Chronic left mastoidectomy. Mucosal edema in the mastoidectomy cavity. Mucosal edema paranasal sinuses.  Negative orbit Other: None IMPRESSION: Left frontal craniotomy. Encephalomalacia in the anterior frontal 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 No acute hemorrhage or infarction. Electronically Signed   By: Franchot Gallo M.D.   On: 10/19/2018 09:37   US Renal  Result Date: 10/19/2018 CLINICAL DATA:  63 year old male with a history of acute renal failure EXAM: RENAL / URINARY TRACT ULTRASOUND COMPLETE COMPARISON:  CT 10/20/2016 FINDINGS: Right Kidney: Length: 12.3 cm x 6.7 cm x 6.6 cm, 281 cc. Hydronephrosis of the  right kidney new from the comparison CT scan. Flow confirmed in the hilum of the right kidney. Left Kidney: Length: 13.1 cm x 5.5 cm x 5.6 cm, 211 cc. Hydronephrosis of the left kidney, new from the comparison CT. Flow confirmed in the hilum of the left kidney. Bladder: Foley catheter within the urinary bladder which is decompressed. IMPRESSION: Bilateral hydronephrosis, new from the comparison CT. Further evaluation with abdomen/pelvis CT imaging may be useful. Electronically Signed   By: Corrie Mckusick D.O.   On: 10/19/2018 15:35     Medications:   . ceFEPime (MAXIPIME) IV    . levETIRAcetam 500 mg (10/20/18 1026)  . phenylephrine (NEO-SYNEPHRINE) Adult infusion 20  mcg/min (10/20/18 0742)  . propofol (DIPRIVAN) infusion 30 mcg/kg/min (10/20/18 0742)  .  sodium bicarbonate  infusion 1000 mL Stopped (10/20/18 0625)   . chlorhexidine gluconate (MEDLINE KIT)  15 mL Mouth Rinse BID  . free water  200 mL Per Tube Q4H  . heparin  5,000 Units Subcutaneous Q8H  . hydrocortisone sod succinate (SOLU-CORTEF) inj  100 mg Intravenous Q8H  . mouth rinse  15 mL Mouth Rinse 10 times per day   polyethylene glycol  Assessment/ Plan:  63 y.o. male with a PMHx of cerebral hemorrhage, depression, erectile dysfunction, hyperlipidemia, hypertension, CVA, who was admitted to [facility] on 10/19/2018 for evaluation of altered mental status and hypoglycemia while at a nursing home.   Patient with relatively recent admission to [facility] where patient had tracheostomy in the setting of cerebral aneurysm.  1.  Acute renal failure creatinine 3.53 with baseline creatinine of 0.5. 2.  Urinary retention. 3.  Hyperkalemia serum potassium 5.8. 4.  Metabolic acidosis.  Plan:  The patient's acute renal failure is severe but improving.  Creatinine down to 6.98.  Urine output was 2.7 L over the preceding 24 hours.  Hyponatremia also noted.  This was discussed with Dr. Patsey Berthold.  She will increase free water flushes to 200 cc  every 4 hours.  Hyperkalemia has improved as potassium down to 4.7.  Metabolic acidosis also improved with serum bicarbonate now 25.  Overall patient continues to have guarded prognosis.   LOS: 1 Karin Griffith 5/24/202010:56 AM

## 2018-10-20 NOTE — Progress Notes (Signed)
Follow up - Critical Care Medicine Note  Patient Details:    [name] is an 63 y.o. male. Patient with acute mental status change in the setting of prior cerebral hemorrhage and CVA.  Required intubation due to inability to protect airway.  Urinary retention with acute renal failure noted.  Patient on mechanical ventilation in the ICU.   Lines, Airways, Drains: Airway 8 mm (Active)  Secured at (cm) 24 cm 10/20/2018  4:00 PM  Measured From Lips 10/20/2018  4:00 PM  [place] 10/20/2018  4:00 PM  Secured By [employer] 10/20/2018  4:00 PM  Tube Holder Repositioned Yes 10/20/2018  3:15 PM  Cuff Pressure (cm H2O) 28 cm H2O 10/20/2018  3:15 PM  Site Condition Dry 10/20/2018  4:00 PM     NG/OG Tube Orogastric 18 Fr. Left mouth Aucultation (Active)  Cm Marking at Nare/Corner of Mouth (if applicable) 62 cm 02/17/1940  4:00 PM  External Length of Tube (cm) - (if applicable) 64 cm 7/40/8144  4:00 PM  Site Assessment Clean;Dry;Intact 10/20/2018  4:00 PM  Ongoing Placement Verification No change in respiratory status;No acute changes, not attributed to clinical condition 10/20/2018  4:00 PM  Status Clamped 10/20/2018  4:00 PM  Drainage Appearance [place] Shark 10/20/2018  8:00 AM  Intake (mL) 25 mL 10/20/2018  1:27 PM  Output (mL) 100 mL 10/20/2018 11:45 AM     Urethral Catheter Anda Kraft RN Temperature probe (Active)  Indication for Insertion or Continuance of Catheter Therapy based on hourly urine output monitoring and documentation for critical condition (NOT STRICT I&O) 10/20/2018  4:00 PM  Site Assessment Clean;Intact 10/20/2018  4:00 PM  Catheter Maintenance Bag below level of bladder;Drainage bag/tubing not touching floor;Catheter secured;Insertion date on drainage bag;No dependent loops;Seal intact 10/20/2018  4:00 PM  Collection Container Standard drainage bag 10/20/2018  4:00 PM  Securement Method Leg strap 10/20/2018  4:00 PM  Urinary Catheter Interventions Unclamped 10/20/2018  4:00 AM   Input (mL) 500 mL 10/20/2018  1:27 PM  Output (mL) 1000 mL 10/20/2018 11:45 AM    Anti-infectives:  Anti-infectives (From admission, onward)   Start     Dose/Rate Route Frequency Ordered Stop   10/20/18 1000  ceFEPIme (MAXIPIME) 1 g in sodium chloride 0.9 % 100 mL IVPB     1 g 200 mL/hr over 30 Minutes Intravenous Every 12 hours 10/20/18 0822     10/19/18 1800  ceFEPIme (MAXIPIME) 1 g in sodium chloride 0.9 % 100 mL IVPB  Status:  Discontinued     1 g 200 mL/hr over 30 Minutes Intravenous Every 24 hours 10/19/18 1215 10/20/18 0822   10/19/18 0930  vancomycin (VANCOCIN) IVPB 1000 mg/200 mL premix     1,000 mg 200 mL/hr over 60 Minutes Intravenous  Once 10/19/18 0915 10/19/18 1154   10/19/18 0930  piperacillin-tazobactam (ZOSYN) IVPB 3.375 g     3.375 g 100 mL/hr over 30 Minutes Intravenous  Once 10/19/18 0915 10/19/18 1050      Microbiology: Results for orders placed or performed during the hospital encounter of 10/19/18  SARS Coronavirus 2 (CEPHEID - Performed in [place] hospital lab), Hosp Order     Status: None   Collection Time: 10/19/18  9:28 AM  Result Value Ref Range Status   SARS Coronavirus 2 NEGATIVE NEGATIVE Final    Comment: (NOTE) If result is NEGATIVE SARS-CoV-2 target nucleic acids are NOT DETECTED. The SARS-CoV-2 RNA is generally detectable in upper and lower  respiratory specimens during the  acute phase of infection. The lowest  concentration of SARS-CoV-2 viral copies this assay can detect is 250  copies / mL. A negative result does not preclude SARS-CoV-2 infection  and should not be used as the sole basis for treatment or other  patient management decisions.  A negative result may occur with  improper specimen collection / handling, submission of specimen other  than nasopharyngeal swab, presence of viral mutation(s) within the  areas targeted by this assay, and inadequate number of viral copies  (<250 copies / mL). A negative result must be combined  with clinical  observations, patient history, and epidemiological information. If result is POSITIVE SARS-CoV-2 target nucleic acids are DETECTED. The SARS-CoV-2 RNA is generally detectable in upper and lower  respiratory specimens dur ing the acute phase of infection.  Positive  results are indicative of active infection with SARS-CoV-2.  Clinical  correlation with patient history and other diagnostic information is  necessary to determine patient infection status.  Positive results do  not rule out bacterial infection or co-infection with other viruses. If result is PRESUMPTIVE POSTIVE SARS-CoV-2 nucleic acids MAY BE PRESENT.   A presumptive positive result was obtained on the submitted specimen  and confirmed on repeat testing.  While 2019 novel coronavirus  (SARS-CoV-2) nucleic acids may be present in the submitted sample  additional confirmatory testing may be necessary for epidemiological  and / or clinical management purposes  to differentiate between  SARS-CoV-2 and other Sarbecovirus currently known to infect humans.  If clinically indicated additional testing with an alternate test  methodology [phone]) is advised. The SARS-CoV-2 RNA is generally  detectable in upper and lower respiratory sp ecimens during the acute  phase of infection. The expected result is Negative. Fact Sheet for Patients:  StrictlyIdeas.no Fact Sheet for Healthcare Providers: BankingDealers.co.za This test is not yet approved or cleared by the [place] FDA and has been authorized for detection and/or diagnosis of SARS-CoV-2 by FDA under an Emergency Use Authorization (EUA).  This EUA will remain in effect (meaning this test can be used) for the duration of the COVID-19 declaration under Section 564(b)(1) of the Act, 21 U.S.C. section 360bbb-3(b)(1), unless the authorization is terminated or revoked sooner. Performed at [facility], [address] [place], [place] [address]   Blood culture (routine x 2)     Status: None (Preliminary result)   Collection Time: 10/19/18  9:36 AM  Result Value Ref Range Status   Specimen Description BLOOD LUA  Final   Special Requests   Final    BOTTLES DRAWN AEROBIC AND ANAEROBIC Blood Culture adequate volume   Culture   Final    NO GROWTH < 24 HOURS Performed at [facility], [address], [place], [place] [address]    Report Status PENDING  Incomplete  Blood culture (routine x 2)     Status: None (Preliminary result)   Collection Time: 10/19/18 10:16 AM  Result Value Ref Range Status   Specimen Description BLOOD LFA  Final   Special Requests   Final    BOTTLES DRAWN AEROBIC AND ANAEROBIC Blood Culture results may not be optimal due to an inadequate volume of blood received in culture bottles   Culture   Final    NO GROWTH < 24 HOURS Performed at [facility], [address] [place], [place] [address]    Report Status PENDING  Incomplete  MRSA PCR Screening     Status: Abnormal   Collection Time: 10/19/18  1:06 PM  Result Value Ref Range Status   MRSA by PCR POSITIVE (A) NEGATIVE Final    Comment:        The GeneXpert MRSA Assay (FDA approved for NASAL specimens only), is one component of a comprehensive MRSA colonization surveillance program. It is not intended to diagnose MRSA infection nor to guide or monitor treatment for MRSA infections. CRITICAL RESULT CALLED TO, READ BACK BY AND VERIFIED WITH: CALLED TO Clarisa Schools @[phone]  10/19/2018 [facility] Performed at [facility], [address] [place], [place] [address]     Best Practice/Protocols:  VTE Prophylaxis: Heparin (SQ) Continuous sedation ICU hypo-/hyperglycemia management  Events: Intubated 5/23 due to decreased level of consciousness.   Studies: Dg Chest 1 View  Result Date: 10/19/2018 CLINICAL DATA:  Encounter for evaluation of ET and OG tube placement. EXAM: CHEST  1 VIEW  COMPARISON:  04/02/2017 FINDINGS: Endotracheal tube has been placed with the tip approximately 4.5 cm above carina. Gastric tube extends to the decompressed stomach. Implanted event monitor overlies the left chest. Relatively low lung volumes with no focal infiltrate or overt edema. Heart size and mediastinal contours are within normal limits. No effusion. Visualized bones unremarkable. IMPRESSION: 1. Endotracheal tube and gastric tube placement as above. 2. No acute cardiopulmonary disease. Electronically Signed   By: Lucrezia Europe M.D.   On: 10/19/2018 11:02   Ct Head Wo Contrast  Result Date: 10/19/2018 CLINICAL DATA:  Altered level of consciousness. EXAM: CT HEAD WITHOUT CONTRAST TECHNIQUE: Contiguous axial images were obtained from the base of the skull through the vertex without intravenous contrast. COMPARISON:  CT head 04/13/2017 FINDINGS: Brain: Image quality degraded by motion and streak artifact from metal devices. 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 Interval left frontal craniotomy. New aneurysm clip in the region of the left A2 segment. Previously identified pipeline stent in the left A2 segment. Negative for acute hemorrhage, acute infarct, or mass lesion. Vascular: Negative for hyperdense vessel. 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 Skull: Interval left frontal craniotomy. 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 Chronic left mastoidectomy. Mucosal edema in the mastoidectomy cavity. Mucosal edema paranasal sinuses.  Negative orbit Other: None IMPRESSION: Left frontal craniotomy. Encephalomalacia in the anterior frontal 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 No acute hemorrhage or infarction. Electronically Signed   By: Franchot Gallo M.D.   On: 10/19/2018 09:37   US Renal  Result Date: 10/19/2018 CLINICAL DATA:  63 year old male with a history of acute renal failure  EXAM: RENAL / URINARY TRACT ULTRASOUND COMPLETE COMPARISON:  CT 10/20/2016 FINDINGS: Right Kidney: Length: 12.3 cm x 6.7 cm x 6.6 cm, 281 cc. Hydronephrosis of the right kidney new from the comparison CT scan. Flow confirmed in the hilum of the right kidney. Left Kidney: Length: 13.1 cm x 5.5 cm x 5.6 cm, 211 cc. Hydronephrosis of the left kidney, new from the comparison CT. Flow confirmed in the hilum of the left kidney. Bladder: Foley catheter within the urinary bladder which is decompressed. IMPRESSION: Bilateral hydronephrosis, new from the comparison CT. Further evaluation with abdomen/pelvis CT imaging may be useful. Electronically Signed   By: Corrie Mckusick D.O.   On: 10/19/2018 15:35    Consults:    Subjective:    Overnight Issues: Remains sedated on the ventilator.  No overt issues overnight.  When he undergoes SAT he bites down on ET tube and becomes asynchronous.  Did not  tolerate SAT.  Could not perform SBT.  Objective:  Vital signs for last 24 hours: Temp:  [97.8 F (36.6 C)-100.5 F (38.1 C)] 98.2 F (36.8 C) (05/24 1200) Pulse Rate:  [86-116] 86 (05/24 1545) Resp:  [14-32] 16 (05/24 1545) BP: (74-146)/(46-111) 88/46 (05/24 1545) SpO2:  [94 %-100 %] 96 % (05/24 1545) FiO2 (%):  [35 %] 35 % (05/24 1515)  Hemodynamic parameters for last 24 hours:    Intake/Output from previous day: 05/23 0701 - 05/24 0700 In: 3123.1 [I.V.:2074.5; IV Piggyback:1048.6] Out: 2790 [Urine:2790]  Intake/Output this shift: Total I/O In: 2260.5 [I.V.:778.5; Other:500; NG/GT:25; IV Piggyback:957] Out: 1100 [Urine:1000; Emesis/NG output:100]  Vent settings for last 24 hours: Vent Mode: SIMV FiO2 (%):  [35 %] 35 % Set Rate:  [16 bmp] 16 bmp Vt Set:  [500 mL] 500 mL PEEP:  [8 cmH20] 8 cmH20 Pressure Support:  [8 cmH20] 8 cmH20 Plateau Pressure:  [19 cmH20] 19 cmH20  Physical Exam:  Nursing note and vitals reviewed. Constitutional: He appears well-developed. Chronically ill-appearing he is  intubated.  Obese  HENT:  Head: Evidence of prior craniotomy. Mouth/Throat: Mucous membranes moist.  Eyes: Pupils are equal, round, and reactive. Conjunctivae are normal. No scleral icterus.  Neck: Neck supple. No JVD present. No tracheal deviation present. No thyromegaly present.  Cardiovascular: Normal rate, regular rhythm, S1 normal, S2 normal and normal pulses.  No murmur heard. Respiratory: Breath sounds normal. He is intubated. He has no wheezes. He has no rhonchi. He has no rales.  GI: Soft. Bowel sounds are normal. He exhibits no distension and no ascites. Genitourinary:    Genitourinary Comments: Foley in place draining  cloudy urine   Musculoskeletal:        General: Edema present.     Comments: 1+ pitting edema lower extremities.  Neurological: He is poorly responsive.  Bites ET tube when awakened  Skin: Skin is warm and dry.  Psychiatric:  Intubated cannot make assessment.   Assessment/Plan:  1.  Acute hypoxic respiratory failure due to acute encephalopathy (toxic metabolic): Continue ventilator support.  Continue current ventilator settings.  Discussed with RT. Daily wake-up assessment and SBT as indicated.  2.  Acute renal failure likely due to bladder outlet obstruction due to BPH: Foley catheter to relieve obstruction.  Volume resuscitated.  Renal function improving..  Follow renal panel.  Avoid nephrotoxins.  Discussed with nephrology.  Appreciate input.  3.  Urinary tract infection due to bladder outlet obstruction with subsequent retention:  Follow cultures.   Continue cefepime.  4.  Severe sepsis with multiorgan failure secondary to the above: Supportive care, antibiotics.  He is improving with supportive care.  Monitor electrolytes, renal function and hepatic function.  Continue to monitor pro calcitonin.  Currently at 13.7.  5.  Hypoglycemia secondary to severe sepsis: Cortisol level adequate, glucose is increasing, continue supportive care.  Will decrease stress  dose steroids and taper off quickly.  6.  Metabolic acidosis due to acute renal failure: Bicarbonate infusion discontinued, monitor.  Improving.  7.  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  Monitor.  8.  Prophylaxis: Heparin subcu and PPI.  9.  COVID 19: negative, COVID ruled out.      LOS: 1 day   Additional comments: Discussed with Dr. Fritzi Mandes.  Discussed with Dr. Holley Raring.  Critical Care Total Time*: 40 Minutes  C. Derrill Kay, MD [facility]  10/20/2018  *Care during the described time interval was provided by me and/or other providers on  the critical care team.  I have reviewed this patient's available data, including medical history, events of note, physical examination and test results as part of my evaluation.

## 2018-10-20 NOTE — Progress Notes (Signed)
[facility] [place] NAME: [name]    MR#:  [id number]  DATE OF BIRTH:  April 30, 1956  SUBJECTIVE:   Patient critically ill intubated on the ventilator   Urine output 2.7 L in 24 hours patient on neosynephrine gtt REVIEW OF SYSTEMS:   Review of Systems  Unable to perform ROS: Intubated   DRUG ALLERGIES:   Allergies  Allergen Reactions  . Metformin Diarrhea    VITALS:  Blood pressure 124/72, pulse 98, temperature 97.8 F (36.6 C), resp. rate (!) 26, height 6' (1.829 m), weight 110.1 kg, SpO2 99 %.  PHYSICAL EXAMINATION:   Physical Exam  GENERAL:  63 y.o.-year-old patient lying in the bed with no acute distress. Critically ill EYES: Pupils equal, round, reactive to light and accommodation. No scleral icterus. Extraocular muscles intact.  HEENT: Head atraumatic, normocephalic. Oropharynx and nasopharynx clear.  NECK:  Supple, no jugular venous distention. No thyroid enlargement, no tenderness.  LUNGS: decreased breath sounds bilaterally, no wheezing, rales, rhonchi. No use of accessory muscles of respiration.  CARDIOVASCULAR: S1, S2 normal. No murmurs, rubs, or gallops.  ABDOMEN: Soft, nontender, nondistended. Bowel sounds present. No organomegaly or mass.  EXTREMITIES: No cyanosis, clubbing o ++ edema b/l.    NEUROLOGIC: intubated  PSYCHIATRIC:intubated SKIN: No obvious rash, lesion, or ulcer--per RN   LABORATORY PANEL:  CBC Recent Labs  Lab 10/20/18 0438  WBC 17.7*  HGB 9.2*  HCT 30.1*  PLT 261    Chemistries  Recent Labs  Lab 10/20/18 0438  NA 151*  K 4.7  CL 114*  CO2 25  GLUCOSE 190*  BUN 77*  CREATININE 6.98*  CALCIUM 8.1*  MG 2.2  AST 9*  ALT 11  ALKPHOS 50  BILITOT 0.6   Cardiac Enzymes Recent Labs  Lab 10/19/18 0900  TROPONINI <0.03   RADIOLOGY:  Dg Chest 1 View  Result Date: 10/19/2018 CLINICAL DATA:  Encounter for evaluation of ET and OG tube placement. EXAM: CHEST  1 VIEW  COMPARISON:  04/02/2017 FINDINGS: Endotracheal tube has been placed with the tip approximately 4.5 cm above carina. Gastric tube extends to the decompressed stomach. Implanted event monitor overlies the left chest. Relatively low lung volumes with no focal infiltrate or overt edema. Heart size and mediastinal contours are within normal limits. No effusion. Visualized bones unremarkable. IMPRESSION: 1. Endotracheal tube and gastric tube placement as above. 2. No acute cardiopulmonary disease. Electronically Signed   By: Lucrezia Europe M.D.   On: 10/19/2018 11:02   Ct Head Wo Contrast  Result Date: 10/19/2018 CLINICAL DATA:  Altered level of consciousness. EXAM: CT HEAD WITHOUT CONTRAST TECHNIQUE: Contiguous axial images were obtained from the base of the skull through the vertex without intravenous contrast. COMPARISON:  CT head 04/13/2017 FINDINGS: Brain: Image quality degraded by motion and streak artifact from metal devices. 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 Interval left frontal craniotomy. New aneurysm clip in the region of the left A2 segment. Previously identified pipeline stent in the left A2 segment. Negative for acute hemorrhage, acute infarct, or mass lesion. Vascular: Negative for hyperdense vessel. 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 Skull: Interval left frontal craniotomy. 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 Chronic left mastoidectomy. Mucosal edema in the mastoidectomy cavity. Mucosal edema paranasal sinuses.  Negative orbit Other: None IMPRESSION: Left frontal craniotomy. Encephalomalacia in the anterior frontal 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  aneurysm. No acute hemorrhage or infarction. Electronically Signed   By: Franchot Gallo M.D.   On: 10/19/2018 09:37   US Renal  Result Date: 10/19/2018 CLINICAL DATA:  63 year old male with a history of acute renal failure  EXAM: RENAL / URINARY TRACT ULTRASOUND COMPLETE COMPARISON:  CT 10/20/2016 FINDINGS: Right Kidney: Length: 12.3 cm x 6.7 cm x 6.6 cm, 281 cc. Hydronephrosis of the right kidney new from the comparison CT scan. Flow confirmed in the hilum of the right kidney. Left Kidney: Length: 13.1 cm x 5.5 cm x 5.6 cm, 211 cc. Hydronephrosis of the left kidney, new from the comparison CT. Flow confirmed in the hilum of the left kidney. Bladder: Foley catheter within the urinary bladder which is decompressed. IMPRESSION: Bilateral hydronephrosis, new from the comparison CT. Further evaluation with abdomen/pelvis CT imaging may be useful. Electronically Signed   By: Corrie Mckusick D.O.   On: 10/19/2018 15:35   ASSESSMENT AND PLAN:  [name]  is a 62 y.o. male with a known history of cerebral hemorrhage, acute hypoxic respiratory failure was admitted at [facility] this year underwent head tracheostomy, history of CVA, hypertension, depression and history of drug abuse comes to the emergency room after he was found with low blood sugar of 34 at the facility. Patient received D10 why EMS came to the emergency room remains unresponsive despite sternal rub got intubated and placed on the ventilator for every protection.  1. Sepsis/septic shock due to urinary tract infection -Discussed with Dr. Patsey Berthold -IV fluids with bicarb and D5 -UOP >3liter -broad-spectrum antibiotic with vancomycin and Zosyn -follow-up urine culture pending - blood culture negative -on IV neo-synephrine and solucortef  2. Acute renal failure suspected due to urinary retention with possible history of prostate enlargement (on-Cardura) -baseline creatinine 0.53 in March 2020 -nephrology consultation with Dr. Zollie Scale spoke with him -avoid nephrotoxins - renal ultrasound-showed bilateral hydronephrosis -creatinine 8.53--- 7.59-- 6.98  3. Hyperkalemia -IV dextrose with insulin, calcium gluconate, bicarb drip -resolved K 4.7  4. Acute  encephalopathy due to hypoglycemia and intubated for airway protection -continue bicarb drip -CT  Head Left frontal craniotomy. Encephalomalacia in the anterior frontal 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  5. History of cerebral hemorrhage s/p clipped aneurysm. - Patient on Keppra for seizure precautions will change to IV Keppra 750 BID  6. Leukocytosis due to UTI and sepsis  7. DVT prophylaxis subcu heparin   CODE STATUS: full  DVT Prophylaxis:heparin  TOTAL critical TIME TAKING CARE OF THIS PATIENT: *35* minutes.  >50% time spent on counselling and coordination of care  POSSIBLE D/C IN  ?DAYS, DEPENDING ON CLINICAL CONDITION.  Note: This dictation was prepared with Dragon dictation along with smaller phrase technology. Any transcriptional errors that result from this process are unintentional.  Fritzi Mandes M.D on 10/20/2018 at 1:23 PM  Between 7am to 6pm - Pager - [phone]  After 6pm go to [email or web address] - password [email or web address] [facility]  Office  [phone]  CC: Primary care physician; Garwin Brothers, MDPatient ID: [name], male   DOB: [birth year], 63 y.o.   MRN: [id number]

## 2018-10-21 DIAGNOSIS — A419 Sepsis, unspecified organism: Secondary | ICD-10-CM

## 2018-10-21 DIAGNOSIS — R6520 Severe sepsis without septic shock: Secondary | ICD-10-CM

## 2018-10-21 LAB — COMPREHENSIVE METABOLIC PANEL
ALT: 11 U/L (ref 0–44)
AST: 9 U/L — ABNORMAL LOW (ref 15–41)
Albumin: 2.4 g/dL — ABNORMAL LOW (ref 3.5–5.0)
Alkaline Phosphatase: 43 U/L (ref 38–126)
Anion gap: 14 (ref 5–15)
BUN: 60 mg/dL — ABNORMAL HIGH (ref 8–23)
CO2: 22 mmol/L (ref 22–32)
Calcium: 7.8 mg/dL — ABNORMAL LOW (ref 8.9–10.3)
Chloride: 114 mmol/L — ABNORMAL HIGH (ref 98–111)
Creatinine, Ser: 4.9 mg/dL — ABNORMAL HIGH (ref 0.61–1.24)
GFR calc Af Amer: 13 mL/min — ABNORMAL LOW (ref >60)
GFR calc non Af Amer: 12 mL/min — ABNORMAL LOW (ref >60)
Glucose, Bld: 185 mg/dL — ABNORMAL HIGH (ref 70–99)
Potassium: 3.3 mmol/L — ABNORMAL LOW (ref 3.5–5.1)
Sodium: 150 mmol/L — ABNORMAL HIGH (ref 135–145)
Total Bilirubin: 0.8 mg/dL (ref 0.3–1.2)
Total Protein: 5.9 g/dL — ABNORMAL LOW (ref 6.5–8.1)

## 2018-10-21 LAB — CBC
HCT: 27.8 % — ABNORMAL LOW (ref 39.0–52.0)
Hemoglobin: 8.4 g/dL — ABNORMAL LOW (ref 13.0–17.0)
MCH: 26.8 pg (ref 26.0–34.0)
MCHC: 30.2 g/dL (ref 30.0–36.0)
MCV: 88.5 fL (ref 80.0–100.0)
Platelets: 270 10*3/uL (ref 150–400)
RBC: 3.14 MIL/uL — ABNORMAL LOW (ref 4.22–5.81)
RDW: 16.3 % — ABNORMAL HIGH (ref 11.5–15.5)
WBC: 12.5 10*3/uL — ABNORMAL HIGH (ref 4.0–10.5)
nRBC: 0 % (ref 0.0–0.2)

## 2018-10-21 LAB — GLUCOSE, CAPILLARY
Glucose-Capillary: 142 mg/dL — ABNORMAL HIGH (ref 70–99)
Glucose-Capillary: 154 mg/dL — ABNORMAL HIGH (ref 70–99)
Glucose-Capillary: 159 mg/dL — ABNORMAL HIGH (ref 70–99)
Glucose-Capillary: 168 mg/dL — ABNORMAL HIGH (ref 70–99)
Glucose-Capillary: 179 mg/dL — ABNORMAL HIGH (ref 70–99)
Glucose-Capillary: 181 mg/dL — ABNORMAL HIGH (ref 70–99)
Glucose-Capillary: 190 mg/dL — ABNORMAL HIGH (ref 70–99)

## 2018-10-21 LAB — PARATHYROID HORMONE, INTACT (NO CA): PTH: 64 pg/mL (ref 15–65)

## 2018-10-21 LAB — PROCALCITONIN: Procalcitonin: 8.09 ng/mL

## 2018-10-21 LAB — GLOMERULAR BASEMENT MEMBRANE ANTIBODIES: GBM Ab: 4 U (ref 0–20)

## 2018-10-21 MED ORDER — VITAL HIGH PROTEIN PO LIQD
1000.0000 mL | ORAL | Status: DC
  Administered 2018-10-21 – 2018-10-25 (×5): 1000 mL

## 2018-10-21 MED ORDER — CHLORHEXIDINE GLUCONATE CLOTH 2 % EX PADS
6.0000 | MEDICATED_PAD | Freq: Every day | CUTANEOUS | Status: DC
  Administered 2018-10-21 – 2018-10-27 (×6): 6 via TOPICAL

## 2018-10-21 MED ORDER — HYDROCORTISONE NA SUCCINATE PF 100 MG IJ SOLR
50.0000 mg | Freq: Three times a day (TID) | INTRAMUSCULAR | Status: DC
  Administered 2018-10-21 – 2018-10-28 (×21): 50 mg via INTRAVENOUS
  Filled 2018-10-21 (×21): qty 2

## 2018-10-21 MED ORDER — POTASSIUM CHLORIDE 20 MEQ PO PACK
40.0000 meq | PACK | Freq: Once | ORAL | Status: AC
  Administered 2018-10-21: 40 meq via ORAL
  Filled 2018-10-21: qty 2

## 2018-10-21 MED ORDER — POTASSIUM CHLORIDE 2 MEQ/ML IV SOLN
INTRAVENOUS | Status: DC
  Administered 2018-10-21 – 2018-10-30 (×13): via INTRAVENOUS
  Filled 2018-10-21 (×16): qty 1000

## 2018-10-21 MED ORDER — SODIUM CHLORIDE 0.9 % IV SOLN
2.0000 g | INTRAVENOUS | Status: DC
  Administered 2018-10-21 – 2018-10-22 (×2): 2 g via INTRAVENOUS
  Filled 2018-10-21 (×2): qty 2

## 2018-10-21 MED ORDER — LEVETIRACETAM IN NACL 500 MG/100ML IV SOLN
500.0000 mg | Freq: Two times a day (BID) | INTRAVENOUS | Status: DC
  Administered 2018-10-21 – 2018-10-27 (×12): 500 mg via INTRAVENOUS
  Filled 2018-10-21 (×17): qty 100

## 2018-10-21 MED ORDER — PRO-STAT SUGAR FREE PO LIQD
60.0000 mL | Freq: Four times a day (QID) | ORAL | Status: DC
  Administered 2018-10-21 – 2018-10-25 (×16): 60 mL

## 2018-10-21 MED ORDER — ADULT MULTIVITAMIN LIQUID CH
15.0000 mL | Freq: Every day | ORAL | Status: DC
  Administered 2018-10-21 – 2018-10-25 (×5): 15 mL
  Filled 2018-10-21 (×5): qty 15

## 2018-10-21 NOTE — Progress Notes (Signed)
Follow up - Critical Care Medicine Note  Patient Details:    [name] is an 63 y.o. male. Patient with acute mental status change in the setting of prior cerebral hemorrhage and CVA.  Required intubation due to inability to protect airway.  Urinary retention with acute renal failure noted.  Patient on mechanical ventilation in the ICU.   Lines, Airways, Drains: Airway 8 mm (Active)  Secured at (cm) 24 cm 10/20/2018  4:00 PM  Measured From Lips 10/20/2018  4:00 PM  [place] 10/20/2018  4:00 PM  Secured By [employer] 10/20/2018  4:00 PM  Tube Holder Repositioned Yes 10/20/2018  3:15 PM  Cuff Pressure (cm H2O) 28 cm H2O 10/20/2018  3:15 PM  Site Condition Dry 10/20/2018  4:00 PM     NG/OG Tube Orogastric 18 Fr. Left mouth Aucultation (Active)  Cm Marking at Nare/Corner of Mouth (if applicable) 62 cm 6/71/2458  4:00 PM  External Length of Tube (cm) - (if applicable) 64 cm 0/99/8338  4:00 PM  Site Assessment Clean;Dry;Intact 10/20/2018  4:00 PM  Ongoing Placement Verification No change in respiratory status;No acute changes, not attributed to clinical condition 10/20/2018  4:00 PM  Status Clamped 10/20/2018  4:00 PM  Drainage Appearance Owens Shark 10/20/2018  8:00 AM  Intake (mL) 25 mL 10/20/2018  1:27 PM  Output (mL) 100 mL 10/20/2018 11:45 AM     Urethral Catheter Anda Kraft RN Temperature probe (Active)  Indication for Insertion or Continuance of Catheter Therapy based on hourly urine output monitoring and documentation for critical condition (NOT STRICT I&O) 10/20/2018  4:00 PM  Site Assessment Clean;Intact 10/20/2018  4:00 PM  Catheter Maintenance Bag below level of bladder;Drainage bag/tubing not touching floor;Catheter secured;Insertion date on drainage bag;No dependent loops;Seal intact 10/20/2018  4:00 PM  Collection Container Standard drainage bag 10/20/2018  4:00 PM  Securement Method Leg strap 10/20/2018  4:00 PM  Urinary Catheter Interventions Unclamped 10/20/2018  4:00 AM   Input (mL) 500 mL 10/20/2018  1:27 PM  Output (mL) 1000 mL 10/20/2018 11:45 AM    Anti-infectives:  Anti-infectives (From admission, onward)   Start     Dose/Rate Route Frequency Ordered Stop   10/22/18 0000  ceFEPIme (MAXIPIME) 2 g in sodium chloride 0.9 % 100 mL IVPB     2 g 200 mL/hr over 30 Minutes Intravenous Every 24 hours 10/21/18 1338     10/20/18 1000  ceFEPIme (MAXIPIME) 1 g in sodium chloride 0.9 % 100 mL IVPB  Status:  Discontinued     1 g 200 mL/hr over 30 Minutes Intravenous Every 12 hours 10/20/18 0822 10/21/18 1338   10/19/18 1800  ceFEPIme (MAXIPIME) 1 g in sodium chloride 0.9 % 100 mL IVPB  Status:  Discontinued     1 g 200 mL/hr over 30 Minutes Intravenous Every 24 hours 10/19/18 1215 10/20/18 0822   10/19/18 0930  vancomycin (VANCOCIN) IVPB 1000 mg/200 mL premix     1,000 mg 200 mL/hr over 60 Minutes Intravenous  Once 10/19/18 0915 10/19/18 1154   10/19/18 0930  piperacillin-tazobactam (ZOSYN) IVPB 3.375 g     3.375 g 100 mL/hr over 30 Minutes Intravenous  Once 10/19/18 0915 10/19/18 1050      Microbiology: Results for orders placed or performed during the hospital encounter of 10/19/18  SARS Coronavirus 2 (CEPHEID - Performed in [facility] hospital lab), Hosp Order     Status: None   Collection Time: 10/19/18  9:28 AM  Result Value Ref Range  Status   SARS Coronavirus 2 NEGATIVE NEGATIVE Final    Comment: (NOTE) If result is NEGATIVE SARS-CoV-2 target nucleic acids are NOT DETECTED. The SARS-CoV-2 RNA is generally detectable in upper and lower  respiratory specimens during the acute phase of infection. The lowest  concentration of SARS-CoV-2 viral copies this assay can detect is 250  copies / mL. A negative result does not preclude SARS-CoV-2 infection  and should not be used as the sole basis for treatment or other  patient management decisions.  A negative result may occur with  improper specimen collection / handling, submission of specimen other  than  nasopharyngeal swab, presence of viral mutation(s) within the  areas targeted by this assay, and inadequate number of viral copies  (<250 copies / mL). A negative result must be combined with clinical  observations, patient history, and epidemiological information. If result is POSITIVE SARS-CoV-2 target nucleic acids are DETECTED. The SARS-CoV-2 RNA is generally detectable in upper and lower  respiratory specimens dur ing the acute phase of infection.  Positive  results are indicative of active infection with SARS-CoV-2.  Clinical  correlation with patient history and other diagnostic information is  necessary to determine patient infection status.  Positive results do  not rule out bacterial infection or co-infection with other viruses. If result is PRESUMPTIVE POSTIVE SARS-CoV-2 nucleic acids MAY BE PRESENT.   A presumptive positive result was obtained on the submitted specimen  and confirmed on repeat testing.  While 2019 novel coronavirus  (SARS-CoV-2) nucleic acids may be present in the submitted sample  additional confirmatory testing may be necessary for epidemiological  and / or clinical management purposes  to differentiate between  SARS-CoV-2 and other Sarbecovirus currently known to infect humans.  If clinically indicated additional testing with an alternate test  methodology [phone]) is advised. The SARS-CoV-2 RNA is generally  detectable in upper and lower respiratory sp ecimens during the acute  phase of infection. The expected result is Negative. Fact Sheet for Patients:  StrictlyIdeas.no Fact Sheet for Healthcare Providers: BankingDealers.co.za This test is not yet approved or cleared by the [place] FDA and has been authorized for detection and/or diagnosis of SARS-CoV-2 by FDA under an Emergency Use Authorization (EUA).  This EUA will remain in effect (meaning this test can be used) for the duration of  the COVID-19 declaration under Section 564(b)(1) of the Act, 21 U.S.C. section 360bbb-3(b)(1), unless the authorization is terminated or revoked sooner. Performed at [facility], [address] [place], [place] [address]   Blood culture (routine x 2)     Status: None (Preliminary result)   Collection Time: 10/19/18  9:36 AM  Result Value Ref Range Status   Specimen Description BLOOD LUA  Final   Special Requests   Final    BOTTLES DRAWN AEROBIC AND ANAEROBIC Blood Culture adequate volume   Culture   Final    NO GROWTH 2 DAYS Performed at [facility], [address], [place],  [address]    Report Status PENDING  Incomplete  Blood culture (routine x 2)     Status: None (Preliminary result)   Collection Time: 10/19/18 10:16 AM  Result Value Ref Range Status   Specimen Description BLOOD LFA  Final   Special Requests   Final    BOTTLES DRAWN AEROBIC AND ANAEROBIC Blood Culture results may not be optimal due to an inadequate volume of blood received in culture bottles   Culture   Final    NO  GROWTH 2 DAYS Performed at [facility], [address] [place], [place] [address]    Report Status PENDING  Incomplete  MRSA PCR Screening     Status: Abnormal   Collection Time: 10/19/18  1:06 PM  Result Value Ref Range Status   MRSA by PCR POSITIVE (A) NEGATIVE Final    Comment:        The GeneXpert MRSA Assay (FDA approved for NASAL specimens only), is one component of a comprehensive MRSA colonization surveillance program. It is not intended to diagnose MRSA infection nor to guide or monitor treatment for MRSA infections. CRITICAL RESULT CALLED TO, READ BACK BY AND VERIFIED WITH: CALLED TO TANYA SILVA @1426  10/19/2018 [facility] Performed at [facility], [address] [place], [place] [address]     Best Practice/Protocols:  VTE Prophylaxis: Heparin (SQ) Continuous sedation ICU hypo-/hyperglycemia management  Events: Intubated 5/23  due to decreased level of consciousness.   Studies: Dg Chest 1 View  Result Date: 10/19/2018 CLINICAL DATA:  Encounter for evaluation of ET and OG tube placement. EXAM: CHEST  1 VIEW COMPARISON:  04/02/2017 FINDINGS: Endotracheal tube has been placed with the tip approximately 4.5 cm above carina. Gastric tube extends to the decompressed stomach. Implanted event monitor overlies the left chest. Relatively low lung volumes with no focal infiltrate or overt edema. Heart size and mediastinal contours are within normal limits. No effusion. Visualized bones unremarkable. IMPRESSION: 1. Endotracheal tube and gastric tube placement as above. 2. No acute cardiopulmonary disease. Electronically Signed   By: Lucrezia Europe M.D.   On: 10/19/2018 11:02   Ct Head Wo Contrast  Result Date: 10/19/2018 CLINICAL DATA:  Altered level of consciousness. EXAM: CT HEAD WITHOUT CONTRAST TECHNIQUE: Contiguous axial images were obtained from the base of the skull through the vertex without intravenous contrast. COMPARISON:  CT head 04/13/2017 FINDINGS: Brain: Image quality degraded by motion and streak artifact from metal devices. 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 Interval left frontal craniotomy. New aneurysm clip in the region of the left A2 segment. Previously identified pipeline stent in the left A2 segment. Negative for acute hemorrhage, acute infarct, or mass lesion. Vascular: Negative for hyperdense vessel. 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 Skull: Interval left frontal craniotomy. 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 Chronic left mastoidectomy. Mucosal edema in the mastoidectomy cavity. Mucosal edema paranasal sinuses.  Negative orbit Other: None IMPRESSION: Left frontal craniotomy. Encephalomalacia in the anterior frontal 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 No acute hemorrhage or  infarction. Electronically Signed   By: Franchot Gallo M.D.   On: 10/19/2018 09:37   US Renal  Result Date: 10/19/2018 CLINICAL DATA:  63 year old male with a history of acute renal failure EXAM: RENAL / URINARY TRACT ULTRASOUND COMPLETE COMPARISON:  CT 10/20/2016 FINDINGS: Right Kidney: Length: 12.3 cm x 6.7 cm x 6.6 cm, 281 cc. Hydronephrosis of the right kidney new from the comparison CT scan. Flow confirmed in the hilum of the right kidney. Left Kidney: Length: 13.1 cm x 5.5 cm x 5.6 cm, 211 cc. Hydronephrosis of the left kidney, new from the comparison CT. Flow confirmed in the hilum of the left kidney. Bladder: Foley catheter within the urinary bladder which is decompressed. IMPRESSION: Bilateral hydronephrosis, new from the comparison CT. Further evaluation with abdomen/pelvis CT imaging may be useful. Electronically Signed   By: Corrie Mckusick D.O.   On: 10/19/2018 15:35  Consults:    Subjective:    Overnight Issues: Remains sedated on the ventilator.  No overt issues overnight.  Slow to awaken.  Could not perform SBT.  Objective:  Vital signs for last 24 hours: Temp:  [98.2 F (36.8 C)-100 F (37.8 C)] 99.9 F (37.7 C) (05/25 1300) Pulse Rate:  [70-100] 88 (05/25 1300) Resp:  [12-17] 16 (05/25 1300) BP: (80-131)/(32-80) 131/67 (05/25 1300) SpO2:  [94 %-100 %] 96 % (05/25 1300) FiO2 (%):  [35 %] 35 % (05/25 0840)  Hemodynamic parameters for last 24 hours:    Intake/Output from previous day: 05/24 0701 - 05/25 0700 In: 3077 [I.V.:1495.1; NG/GT:25; IV Piggyback:1057] Out: 3300 [Urine:3200; Emesis/NG output:100]  Intake/Output this shift: Total I/O In: 336.2 [I.V.:136.2; IV Piggyback:200] Out: 1300 [Urine:1300]  Vent settings for last 24 hours: Vent Mode: SIMV FiO2 (%):  [35 %] 35 % Set Rate:  [16 bmp] 16 bmp Vt Set:  [500 mL] 500 mL PEEP:  [8 cmH20] 8 cmH20 Pressure Support:  [8 cmH20] 8 cmH20 Plateau Pressure:  [16 cmH20-18 cmH20] 18 cmH20  Physical Exam:   Nursing note and vitals reviewed. Constitutional: Obese. Chronically ill-appearing he is intubated.  HENT:  Head: Evidence of prior craniotomy. Mouth/Throat: Mucous membranes moist.  Eyes: Pupils are equal, round, and reactive. Conjunctivae are normal. No scleral icterus.  Neck: Neck supple. No JVD present. No tracheal deviation present. No thyromegaly present.  Cardiovascular: Normal rate, regular rhythm, S1 normal, S2 normal and normal pulses.  No murmur heard. Respiratory: Breath sounds coarse. He is intubated. He has no wheezes. He has no rhonchi. He has no rales.  GI: Soft. Bowel sounds are normal. He exhibits no distension and no ascites. GU: Foley in place draining   clear urine (continued requirement due to bladder outlet obstruction) Musculoskeletal:        General: Edema present.     Comments: Trace to 1+ pitting edema lower extremities.  Neurological: He is poorly responsive.  Bites ET tube when awakened  Skin: Skin is warm and dry.  Psychiatric:  Intubated cannot make assessment.   Assessment/Plan:  1.  Acute hypoxic respiratory failure due to acute encephalopathy (toxic metabolic): Continue ventilator support.  Continue current ventilator settings.  Discussed with RT. Daily wake-up assessment and SBT as indicated.  Will attempt SBT again today.  However has been slow to awaken.  2.  Acute renal failure likely due to bladder outlet obstruction due to BPH: Foley catheter to relieve obstruction.  Volume resuscitated.  Renal function improving..  Follow renal panel.  Avoid nephrotoxins.  Ultrasound from 5/23 showed bilateral hydronephrosis will need CT abdomen and pelvis once able to have contrast administered.  Interestingly, cannot find urine culture requested  on admission.  Will reorder.  3.  Urinary tract infection due to bladder outlet obstruction with subsequent retention:  Follow cultures.   Continue cefepime.  4.  Severe sepsis with multiorgan failure secondary to the  above: Supportive care, antibiotics.  He is improving with supportive care.  Monitor electrolytes, renal function and hepatic function.  Continue to monitor pro calcitonin.  Currently at 8.0  5.  Hypoglycemia secondary to severe sepsis: Cortisol level adequate, glucose is increasing, continue supportive care.  Will decrease stress dose steroids and taper off quickly.  No further episodes of hypoglycemia.  Steroids decreased yesterday.  6.  Metabolic acidosis due to acute renal failure: Bicarbonate infusion discontinued, monitor.  Improving.  7.  Seizure disorder due to prior aneurysm clipping and prior CVA: Continue  Keppra.  Monitor.  This issue will add complexity to his management.   8.  Prophylaxis: Heparin subcu   9.  COVID 19: negative, COVID ruled out.      LOS: 2 days   Additional comments: Discussed at multidisciplinary rounds with ICU team.  Critical Care Total Time*: 35 minutes  C. Derrill Kay, MD [facility]  10/21/2018  *Care during the described time interval was provided by me and/or other providers on the critical care team.  I have reviewed this patient's available data, including medical history, events of note, physical examination and test results as part of my evaluation.

## 2018-10-21 NOTE — Progress Notes (Signed)
[facility] [place] NAME: [name]    MR#:  [id number]  DATE OF BIRTH:  [birth year]  SUBJECTIVE:   Patient critically ill intubated on the ventilator   Urine output 3.3 L in 24 hours patient on neosynephrine gtt REVIEW OF SYSTEMS:   Review of Systems  Unable to perform ROS: Intubated   DRUG ALLERGIES:   Allergies  Allergen Reactions  . Metformin Diarrhea    VITALS:  Blood pressure 131/67, pulse 88, temperature 99.9 F (37.7 C), resp. rate 16, height 6' (1.829 m), weight 110.1 kg, SpO2 96 %.  PHYSICAL EXAMINATION:   Physical Exam  GENERAL:  63 y.o.-year-old patient lying in the bed with no acute distress. Critically ill EYES: Pupils equal, round, reactive to light and accommodation. No scleral icterus. Extraocular muscles intact.  HEENT: Head atraumatic, normocephalic. Oropharynx and nasopharynx clear.  NECK:  Supple, no jugular venous distention. No thyroid enlargement, no tenderness.  LUNGS: decreased breath sounds bilaterally, no wheezing, rales, rhonchi. No use of accessory muscles of respiration.  CARDIOVASCULAR: S1, S2 normal. No murmurs, rubs, or gallops.  ABDOMEN: Soft, nontender, nondistended. Bowel sounds present. No organomegaly or mass.  EXTREMITIES: No cyanosis, clubbing o ++ edema b/l.    NEUROLOGIC: intubated  PSYCHIATRIC:intubated SKIN: No obvious rash, lesion, or ulcer--per RN   LABORATORY PANEL:  CBC Recent Labs  Lab 10/21/18 0402  WBC 12.5*  HGB 8.4*  HCT 27.8*  PLT 270    Chemistries  Recent Labs  Lab 10/20/18 0438 10/21/18 0402  NA 151* 150*  K 4.7 3.3*  CL 114* 114*  CO2 25 22  GLUCOSE 190* 185*  BUN 77* 60*  CREATININE 6.98* 4.90*  CALCIUM 8.1* 7.8*  MG 2.2  --   AST 9* 9*  ALT 11 11  ALKPHOS 50 43  BILITOT 0.6 0.8   Cardiac Enzymes Recent Labs  Lab 10/19/18 0900  TROPONINI <0.03   RADIOLOGY:  US Renal  Result Date: 10/19/2018 CLINICAL DATA:  63 year old male  with a history of acute renal failure EXAM: RENAL / URINARY TRACT ULTRASOUND COMPLETE COMPARISON:  CT 10/20/2016 FINDINGS: Right Kidney: Length: 12.3 cm x 6.7 cm x 6.6 cm, 281 cc. Hydronephrosis of the right kidney new from the comparison CT scan. Flow confirmed in the hilum of the right kidney. Left Kidney: Length: 13.1 cm x 5.5 cm x 5.6 cm, 211 cc. Hydronephrosis of the left kidney, new from the comparison CT. Flow confirmed in the hilum of the left kidney. Bladder: Foley catheter within the urinary bladder which is decompressed. IMPRESSION: Bilateral hydronephrosis, new from the comparison CT. Further evaluation with abdomen/pelvis CT imaging may be useful. Electronically Signed   By: Corrie Mckusick D.O.   On: 10/19/2018 15:35   ASSESSMENT AND PLAN:  [name]  is a 63 y.o. male with a known history of cerebral hemorrhage, acute hypoxic respiratory failure was admitted at [facility] this year underwent head tracheostomy, history of CVA, hypertension, depression and history of drug abuse comes to the emergency room after he was found with low blood sugar of 34 at the facility. Patient received D10 why EMS came to the emergency room remains unresponsive despite sternal rub got intubated and placed on the ventilator for every protection.  1. Sepsis/septic shock due to urinary tract infection -Discussed with Dr. Patsey Berthold -IV fluids with bicarb initially  And now D5W -UOP >3liter -broad-spectrum antibiotic with cefepime - blood culture negative -on IV neo-synephrine and solucortef  2. Acute renal failure suspected due to urinary retention withhistory of prostate enlargement (on-[facility]) -baseline creatinine 0.53 in March 2020 -nephrology consultation with Dr. Zollie Scale spoke with him -avoid nephrotoxins - renal ultrasound-showed bilateral hydronephrosis -creatinine 8.53--- 7.59-- 6.98--4.9  3. Hyperkalemia -IV dextrose with insulin, calcium gluconate, bicarb drip -resolved K 4.7  4. Acute  encephalopathy due to hypoglycemia and intubated for airway protection -continue bicarb drip -CT  Head Left frontal craniotomy. Encephalomalacia in the anterior frontal 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  5. History of cerebral hemorrhage s/p clipped aneurysm. - Patient on Keppra for seizure precautions will change to IV Keppra 750 BID  6. Leukocytosis due to UTI and sepsis  7. DVT prophylaxis subcu heparin   CODE STATUS: full  DVT Prophylaxis:heparin  TOTAL critical TIME TAKING CARE OF THIS PATIENT: *25* minutes.  >50% time spent on counselling and coordination of care  POSSIBLE D/C IN  ?DAYS, DEPENDING ON CLINICAL CONDITION.  Note: This dictation was prepared with Dragon dictation along with smaller phrase technology. Any transcriptional errors that result from this process are unintentional.  Fritzi Mandes M.D on 10/21/2018 at 1:55 PM  Between 7am to 6pm - Pager - [phone]  After 6pm go to [email or web address] - password [email or web address] [facility]  Office  [phone]  CC: Primary care physician; Garwin Brothers, MDPatient ID: [name], male   DOB: [birth year], 63 y.o.   MRN: [id number]

## 2018-10-21 NOTE — Progress Notes (Signed)
[facility]  ROUNDING NOTE   Subjective:  Patient remains critically ill. Urine output was 3.2 L over the preceding 24 hours. Creatinine now down to 4.9. Still on the ventilator. Serum sodium high at 150.   Objective:  Vital signs in last 24 hours:  Temp:  [98.2 F (36.8 C)-100 F (37.8 C)] 99.3 F (37.4 C) (05/25 1100) Pulse Rate:  [70-101] 89 (05/25 1100) Resp:  [12-29] 17 (05/25 1100) BP: (77-139)/(32-91) 124/76 (05/25 1100) SpO2:  [94 %-99 %] 96 % (05/25 1100) FiO2 (%):  [35 %] 35 % (05/25 0840)  Weight change:  Filed Weights   10/19/18 0911 10/19/18 1022 10/19/18 1309  Weight: 113.4 kg 120.6 kg 110.1 kg    Intake/Output: I/O last 3 completed shifts: In: 5900.1 [I.V.:3569.5; Other:500; NG/GT:25; IV Piggyback:1805.5] Out: 4450 [Urine:4350; Emesis/NG output:100]   Intake/Output this shift:  Total I/O In: 195.7 [I.V.:95.7; IV Piggyback:100] Out: 300 [Urine:300]  Physical Exam: General: Critically ill appearing  Head: Normocephalic, atraumatic. ETT in place  Eyes: Anicteric  Neck: Supple, trachea midline  Lungs:  Scattered rhonchi, vent assisted  Heart: S1S2 no rubs  Abdomen:  Soft, nontender, bowel sounds present  Extremities: Trace peripheral edema.  Neurologic: Lethargic but arousable  Skin: No lesions       Basic Metabolic Panel: Recent Labs  Lab 10/19/18 1636 10/19/18 2025 10/19/18 2306 10/20/18 0438 10/21/18 0402  NA 146* 148* 148* 151* 150*  K 5.0 5.2* 4.9 4.7 3.3*  CL 114* 116* 115* 114* 114*  CO2 19* 21* 21* 25 22  GLUCOSE 61* 99 123* 190* 185*  BUN 84* 81* 80* 77* 60*  CREATININE 8.02* 7.78* 7.59* 6.98* 4.90*  CALCIUM 7.8* 8.0* 7.8* 8.1* 7.8*  MG  --   --  2.3 2.2  --   PHOS  --  6.2* 5.8* 5.9*  --     Liver Function Tests: Recent Labs  Lab 10/19/18 0900 10/19/18 2025 10/20/18 0438 10/21/18 0402  AST 8*  --  9* 9*  ALT 12  --  11 11  ALKPHOS 55  --  50 43  BILITOT 0.7  --  0.6 0.8  PROT 6.2*  --  5.9* 5.9*   ALBUMIN 2.5* 2.5* 2.8* 2.4*   No results for input(s): LIPASE, AMYLASE in the last 168 hours. No results for input(s): AMMONIA in the last 168 hours.  CBC: Recent Labs  Lab 10/19/18 0900 10/19/18 1357 10/20/18 0438 10/21/18 0402  WBC 20.7* 15.5* 17.7* 12.5*  NEUTROABS 17.8*  --   --   --   HGB 11.4* 9.8* 9.2* 8.4*  HCT 36.3* 33.4* 30.1* 27.8*  MCV 86.4 91.5 88.3 88.5  PLT 310 187 261 270    Cardiac Enzymes: Recent Labs  Lab 10/19/18 0900  TROPONINI <0.03    BNP: Invalid input(s): POCBNP  CBG: Recent Labs  Lab 10/20/18 1933 10/20/18 2339 10/21/18 0440 10/21/18 0829 10/21/18 1159  GLUCAP 188* 209* 179* 154* 142*    Microbiology: Results for orders placed or performed during the hospital encounter of 10/19/18  SARS Coronavirus 2 (CEPHEID - Performed in [facility] hospital lab), Hosp Order     Status: None   Collection Time: 10/19/18  9:28 AM  Result Value Ref Range Status   SARS Coronavirus 2 NEGATIVE NEGATIVE Final    Comment: (NOTE) If result is NEGATIVE SARS-CoV-2 target nucleic acids are NOT DETECTED. The SARS-CoV-2 RNA is generally detectable in upper and lower  respiratory specimens during the acute phase of infection. The   lowest  concentration of SARS-CoV-2 viral copies this assay can detect is 250  copies / mL. A negative result does not preclude SARS-CoV-2 infection  and should not be used as the sole basis for treatment or other  patient management decisions.  A negative result may occur with  improper specimen collection / handling, submission of specimen other  than nasopharyngeal swab, presence of viral mutation(s) within the  areas targeted by this assay, and inadequate number of viral copies  (<250 copies / mL). A negative result must be combined with clinical  observations, patient history, and epidemiological information. If result is POSITIVE SARS-CoV-2 target nucleic acids are DETECTED. The SARS-CoV-2 RNA is generally detectable in  upper and lower  respiratory specimens dur ing the acute phase of infection.  Positive  results are indicative of active infection with SARS-CoV-2.  Clinical  correlation with patient history and other diagnostic information is  necessary to determine patient infection status.  Positive results do  not rule out bacterial infection or co-infection with other viruses. If result is PRESUMPTIVE POSTIVE SARS-CoV-2 nucleic acids MAY BE PRESENT.   A presumptive positive result was obtained on the submitted specimen  and confirmed on repeat testing.  While 2019 novel coronavirus  (SARS-CoV-2) nucleic acids may be present in the submitted sample  additional confirmatory testing may be necessary for epidemiological  and / or clinical management purposes  to differentiate between  SARS-CoV-2 and other Sarbecovirus currently known to infect humans.  If clinically indicated additional testing with an alternate test  methodology [phone]) is advised. The SARS-CoV-2 RNA is generally  detectable in upper and lower respiratory sp ecimens during the acute  phase of infection. The expected result is Negative. Fact Sheet for Patients:  StrictlyIdeas.no Fact Sheet for Healthcare Providers: BankingDealers.co.za This test is not yet approved or cleared by the [place] FDA and has been authorized for detection and/or diagnosis of SARS-CoV-2 by FDA under an Emergency Use Authorization (EUA).  This EUA will remain in effect (meaning this test can be used) for the duration of the COVID-19 declaration under Section 564(b)(1) of the Act, 21 U.S.C. section 360bbb-3(b)(1), unless the authorization is terminated or revoked sooner. Performed at [facility], [address] [place], [place] [address]   Blood culture (routine x 2)     Status: None (Preliminary result)   Collection Time: 10/19/18  9:36 AM  Result Value Ref Range Status   Specimen  Description BLOOD LUA  Final   Special Requests   Final    BOTTLES DRAWN AEROBIC AND ANAEROBIC Blood Culture adequate volume   Culture   Final    NO GROWTH 2 DAYS Performed at [facility], [address], [place], [place] [address]    Report Status PENDING  Incomplete  Blood culture (routine x 2)     Status: None (Preliminary result)   Collection Time: 10/19/18 10:16 AM  Result Value Ref Range Status   Specimen Description BLOOD LFA  Final   Special Requests   Final    BOTTLES DRAWN AEROBIC AND ANAEROBIC Blood Culture results may not be optimal due to an inadequate volume of blood received in culture bottles   Culture   Final    NO GROWTH 2 DAYS Performed at [facility], [address], [place], [place] [address]    Report Status PENDING  Incomplete  MRSA PCR Screening     Status: Abnormal   Collection Time: 10/19/18  1:06 PM  Result Value Ref  Range Status   MRSA by PCR POSITIVE (A) NEGATIVE Final    Comment:        The GeneXpert MRSA Assay (FDA approved for NASAL specimens only), is one component of a comprehensive MRSA colonization surveillance program. It is not intended to diagnose MRSA infection nor to guide or monitor treatment for MRSA infections. CRITICAL RESULT CALLED TO, READ BACK BY AND VERIFIED WITH: CALLED TO TANYA SILVA @1426 10/19/2018 SAC Performed at [facility], [address] [place], [place] [address]     Coagulation Studies: No results for input(s): LABPROT, INR in the last 72 hours.  Urinalysis: Recent Labs    10/19/18 1018  COLORURINE YELLOW*  LABSPEC 1.018  PHURINE 7.0  GLUCOSEU NEGATIVE  HGBUR SMALL*  BILIRUBINUR NEGATIVE  KETONESUR NEGATIVE  PROTEINUR >=300*  NITRITE NEGATIVE  LEUKOCYTESUR TRACE*      Imaging: Us Renal  Result Date: 10/19/2018 CLINICAL DATA:  63-year-old male with a history of acute renal failure EXAM: RENAL / URINARY TRACT ULTRASOUND COMPLETE COMPARISON:  CT 10/20/2016 FINDINGS:  Right Kidney: Length: 12.3 cm x 6.7 cm x 6.6 cm, 281 cc. Hydronephrosis of the right kidney new from the comparison CT scan. Flow confirmed in the hilum of the right kidney. Left Kidney: Length: 13.1 cm x 5.5 cm x 5.6 cm, 211 cc. Hydronephrosis of the left kidney, new from the comparison CT. Flow confirmed in the hilum of the left kidney. Bladder: Foley catheter within the urinary bladder which is decompressed. IMPRESSION: Bilateral hydronephrosis, new from the comparison CT. Further evaluation with abdomen/pelvis CT imaging may be useful. Electronically Signed   By: Jaime  Wagner D.O.   On: 10/19/2018 15:35     Medications:   . ceFEPime (MAXIPIME) IV Stopped (10/21/18 0938)  . levETIRAcetam 500 mg (10/21/18 1029)  . phenylephrine (NEO-SYNEPHRINE) Adult infusion 8 mcg/min (10/21/18 1000)  . propofol (DIPRIVAN) infusion 10 mcg/kg/min (10/21/18 1035)   . chlorhexidine gluconate (MEDLINE KIT)  15 mL Mouth Rinse BID  . Chlorhexidine Gluconate Cloth  6 each Topical Q0600  . free water  200 mL Per Tube Q4H  . heparin  5,000 Units Subcutaneous Q8H  . hydrocortisone sod succinate (SOLU-CORTEF) inj  50 mg Intravenous Q8H  . mouth rinse  15 mL Mouth Rinse 10 times per day   polyethylene glycol  Assessment/ Plan:  63 y.o. male with a PMHx of cerebral hemorrhage, depression, erectile dysfunction, hyperlipidemia, hypertension, CVA, who was admitted to [facility] on 10/19/2018 for evaluation of altered mental status and hypoglycemia while at a nursing home.   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  1.  Acute renal failure creatinine 4.9 with baseline creatinine of 0.5. 2.  Urinary retention. 3.  Hypokalemia.  4.  Metabolic acidosis.  Plan:  Patient remains critically ill but renal function is improving.  Creatinine now down to 4.9.  Good urine output noted.  Urinary retention was playing a role in his acute renal  failure.  Hyperkalemia resolved and in fact potassium now low.  Patient also has hypernatremia.  We will add low rate D5W at 50 cc/h supplemented with potassium chloride.  Weaning as per Dr. Gonzalez.  Further plan as patient progresses.   LOS: 2   5/25/202012:19 PM   

## 2018-10-21 NOTE — Progress Notes (Signed)
Pharmacy Antibiotic Note  [name] is a 63 y.o. male admitted on 10/19/2018 with UTI.  PMH significant for CVA, HTN, tracheostomy. Patient presented with low blood sugar and was unresponsive. Baseline Scr from [facility] in 07/2018 was ~0.55.  Pharmacy has been consulted for Cefepime dosing.  Plan: Renal function improved Crcl 19.5 ml/min , will transition patient to cefepime 2 gm q24h. Will monitor serum creatinine daily during acute renal failure.   Height: 6' (182.9 cm) Weight: 242 lb 11.6 oz (110.1 kg) IBW/kg (Calculated) : 77.6  Temp (24hrs), Avg:99.2 F (37.3 C), Min:98.2 F (36.8 C), Max:100 F (37.8 C)  Recent Labs  Lab 10/19/18 0900 10/19/18 0935 10/19/18 1357 10/19/18 1636 10/19/18 2025 10/19/18 2306 10/20/18 0438 10/21/18 0402  WBC 20.7*  --  15.5*  --   --   --  17.7* 12.5*  CREATININE 8.53*  --  8.30* 8.02* 7.78* 7.59* 6.98* 4.90*  LATICACIDVEN  --  1.0  --   --   --   --   --   --     Estimated Creatinine Clearance: 19.5 mL/min (A) (by C-G formula based on SCr of 4.9 mg/dL (H)).    Allergies  Allergen Reactions  . Metformin Diarrhea    Antimicrobials this admission: 5/23 Vancomycin x 1 5/23 Zosyn x 1 5/23 Cefepime >>  Dose adjustments this admission: 5/25 Cefepime transitioned to 2 gm q24h  Microbiology results: 5/23 BCx: pending    5/23 MRSA PCR: pending 5/23 COVID-19 (-)  Thank you for allowing pharmacy to be a part of this patient's care.  Jemell Town A, 10/21/2018 1:27 PM

## 2018-10-21 NOTE — Progress Notes (Signed)
Initial Nutrition Assessment  RD working remotely.  DOCUMENTATION CODES:   Obesity unspecified  INTERVENTION:  Initiate Vital High Protein at 20 mL/hr (480 mL goal daily volume) + Pro-Stat 60 mL QID per tube. Provides 1280 kcal, 162 grams of protein, 403 mL H2O daily. With current propofol provides 1566 kcal daily.  With free water flush of 200 mL Q4hrs patient will receive a total of 1603 mL H2O daily including water in tube feeding.  Provide liquid MVI daily per tube to meet 100% RDIs for vitamins/minerals.  NUTRITION DIAGNOSIS:   Inadequate oral intake related to inability to eat as evidenced by NPO status.  GOAL:   Provide needs based on ASPEN/SCCM guidelines  MONITOR:   Vent status, Labs, Weight trends, TF tolerance, I & O's  REASON FOR ASSESSMENT:   Ventilator    ASSESSMENT:   63 year old male with PMHx of HTN, HLD, depression, anxiety, hx CVA, hx drug abuse admitted with acute mental status change in setting of prior cerebral hemorrhage and CVA and requiring intubation on 5/23 for airway protection, also with urinary retention with acute renal failure.   Patient intubated and sedated. On synchronized intermittent ventilation (SIMV) with FiO2 35%, Pressure Support 8 cmH2O, and PEEP 8 cmH2O. Abdomen distended per RN documentation. Last BM unknown/PTA. Skin is intact. Patient was unable to complete SBT today. Plan is to initiate enteral nutrition. Weight in chart appears to fluctuate so difficult to trend. Patient is currently 110.1 kg (242.73 lbs).  Enteral Access: 18 Fr. OGT placed 5/23; terminates in stomach per chest x-ray 5/23; 62 cm at corner of mouth  MAP: 75-90 mmHg  Patient is currently intubated on ventilator support Ve: 13 L/min Temp (24hrs), Avg:99.2 F (37.3 C), Min:98.2 F (36.8 C), Max:100 F (37.8 C)  Propofol: 10.85 mL/hr (286 kcal daily)  Medications reviewed and include: free water flush 200 mL Q4hrs, Solu-Cortef 50 mg Q8hrs IV, cefepime, D5W  with KCl 40 mEq at 50 mL/hr, Keppra, phenylephrine gtt at 8 mcg/min, propofol gtt.  Labs reviewed: CBG 142-209, Sodium 150, Potassium 3.3, Chloride 114, BUN 60, Creatinine 4.9.  I/O: 3200 mL UOP yesterday (1.2 mL/kg/hr)  NUTRITION - FOCUSED PHYSICAL EXAM:  Unable to complete at this time.  Diet Order:   Diet Order            Diet NPO time specified  Diet effective now             EDUCATION NEEDS:   No education needs have been identified at this time  Skin:  Skin Assessment: Reviewed RN Assessment  Last BM:  Unknown/PTA  Height:   Ht Readings from Last 1 Encounters:  10/19/18 6' (1.829 m)   Weight:   Wt Readings from Last 1 Encounters:  10/19/18 110.1 kg   Ideal Body Weight:  80.9 kg  BMI:  Body mass index is 32.92 kg/m.  Estimated Nutritional Needs:   Kcal:  1211-1541 (11-14 kcal/kg)  Protein:  162 grams (2 grams/kg IBW)  Fluid:  2 L/day (25 mL/kg IBW)  Willey Blade, MS, RD, LDN Office: [phone] Pager: [phone] After Hours/Weekend Pager: [phone]

## 2018-10-22 LAB — CBC WITH DIFFERENTIAL/PLATELET
Abs Immature Granulocytes: 0.16 10*3/uL — ABNORMAL HIGH (ref 0.00–0.07)
Basophils Absolute: 0 10*3/uL (ref 0.0–0.1)
Basophils Relative: 0 %
Eosinophils Absolute: 0.4 10*3/uL (ref 0.0–0.5)
Eosinophils Relative: 4 %
HCT: 29.4 % — ABNORMAL LOW (ref 39.0–52.0)
Hemoglobin: 8.8 g/dL — ABNORMAL LOW (ref 13.0–17.0)
Immature Granulocytes: 2 %
Lymphocytes Relative: 10 %
Lymphs Abs: 1.1 10*3/uL (ref 0.7–4.0)
MCH: 27 pg (ref 26.0–34.0)
MCHC: 29.9 g/dL — ABNORMAL LOW (ref 30.0–36.0)
MCV: 90.2 fL (ref 80.0–100.0)
Monocytes Absolute: 0.7 10*3/uL (ref 0.1–1.0)
Monocytes Relative: 6 %
Neutro Abs: 8.5 10*3/uL — ABNORMAL HIGH (ref 1.7–7.7)
Neutrophils Relative %: 78 %
Platelets: 291 10*3/uL (ref 150–400)
RBC: 3.26 MIL/uL — ABNORMAL LOW (ref 4.22–5.81)
RDW: 15.9 % — ABNORMAL HIGH (ref 11.5–15.5)
WBC: 10.9 10*3/uL — ABNORMAL HIGH (ref 4.0–10.5)
nRBC: 0 % (ref 0.0–0.2)

## 2018-10-22 LAB — PHOSPHORUS: Phosphorus: 3.7 mg/dL (ref 2.5–4.6)

## 2018-10-22 LAB — GLUCOSE, CAPILLARY
Glucose-Capillary: 159 mg/dL — ABNORMAL HIGH (ref 70–99)
Glucose-Capillary: 147 mg/dL — ABNORMAL HIGH (ref 70–99)
Glucose-Capillary: 134 mg/dL — ABNORMAL HIGH (ref 70–99)
Glucose-Capillary: 184 mg/dL — ABNORMAL HIGH (ref 70–99)
Glucose-Capillary: 176 mg/dL — ABNORMAL HIGH (ref 70–99)

## 2018-10-22 LAB — URINE CULTURE
Culture: NO GROWTH
Special Requests: NORMAL

## 2018-10-22 LAB — BASIC METABOLIC PANEL
Anion gap: 10 (ref 5–15)
BUN: 48 mg/dL — ABNORMAL HIGH (ref 8–23)
CO2: 25 mmol/L (ref 22–32)
Calcium: 7.9 mg/dL — ABNORMAL LOW (ref 8.9–10.3)
Chloride: 120 mmol/L — ABNORMAL HIGH (ref 98–111)
Creatinine, Ser: 2.62 mg/dL — ABNORMAL HIGH (ref 0.61–1.24)
GFR calc Af Amer: 29 mL/min — ABNORMAL LOW (ref >60)
GFR calc non Af Amer: 25 mL/min — ABNORMAL LOW (ref >60)
Glucose, Bld: 177 mg/dL — ABNORMAL HIGH (ref 70–99)
Potassium: 3.3 mmol/L — ABNORMAL LOW (ref 3.5–5.1)
Sodium: 155 mmol/L — ABNORMAL HIGH (ref 135–145)

## 2018-10-22 LAB — C4 COMPLEMENT: Complement C4, Body Fluid: 46 mg/dL — ABNORMAL HIGH (ref 14–44)

## 2018-10-22 LAB — MPO/PR-3 (ANCA) ANTIBODIES
ANCA Proteinase 3: <3.5 U/mL (ref 0.0–3.5)
Myeloperoxidase Abs: <9 U/mL (ref 0.0–9.0)

## 2018-10-22 LAB — ANA W/REFLEX IF POSITIVE: Anti Nuclear Antibody (ANA): NEGATIVE

## 2018-10-22 LAB — C3 COMPLEMENT: C3 Complement: 161 mg/dL (ref 82–167)

## 2018-10-22 LAB — MAGNESIUM: Magnesium: 1.8 mg/dL (ref 1.7–2.4)

## 2018-10-22 MED ORDER — MUPIROCIN 2 % EX OINT
1.0000 "application " | TOPICAL_OINTMENT | Freq: Two times a day (BID) | CUTANEOUS | Status: AC
  Administered 2018-10-22 – 2018-10-26 (×10): 1 via NASAL
  Filled 2018-10-22: qty 22

## 2018-10-22 MED ORDER — GLYCOPYRROLATE 0.2 MG/ML IJ SOLN
0.2000 mg | Freq: Once | INTRAMUSCULAR | Status: AC
  Administered 2018-10-22: 0.2 mg via INTRAVENOUS
  Filled 2018-10-22: qty 1

## 2018-10-22 MED ORDER — CHLORHEXIDINE GLUCONATE CLOTH 2 % EX PADS: 6.0000 | MEDICATED_PAD | Freq: Every day | CUTANEOUS | Status: DC

## 2018-10-22 NOTE — Progress Notes (Signed)
Pharmacy Antibiotic Note  [name] is a 63 y.o. male admitted on 10/19/2018 with UTI.  PMH significant for CVA, HTN, tracheostomy. Patient presented with low blood sugar and was unresponsive. Baseline Scr from [facility] in 07/2018 was ~0.55.  Pharmacy has been consulted for Cefepime dosing.  Plan: Renal function improving CrCl 36.8 ml/min , will continue cefepime 2 gm q24h. Will monitor serum creatinine daily during acute renal failure.   Height: 6' 0.01" (182.9 cm) Weight: 247 lb 5.7 oz (112.2 kg) IBW/kg (Calculated) : 77.62  Temp (24hrs), Avg:99.2 F (37.3 C), Min:97.9 F (36.6 C), Max:100.2 F (37.9 C)  Recent Labs  Lab 10/19/18 0900 10/19/18 0935 10/19/18 1357  10/19/18 2025 10/19/18 2306 10/20/18 0438 10/21/18 0402 10/22/18 0532  WBC 20.7*  --  15.5*  --   --   --  17.7* 12.5* 10.9*  CREATININE 8.53*  --  8.30*   < > 7.78* 7.59* 6.98* 4.90* 2.62*  LATICACIDVEN  --  1.0  --   --   --   --   --   --   --    < > = values in this interval not displayed.    Estimated Creatinine Clearance: 36.8 mL/min (A) (by C-G formula based on SCr of 2.62 mg/dL (H)).    Allergies  Allergen Reactions  . Metformin Diarrhea    Antimicrobials this admission: 5/23 Vancomycin x 1 5/23 Zosyn x 1 5/23 Cefepime >>  Dose adjustments this admission: 5/25 Cefepime transitioned to 2 gm q24h  Microbiology results: 5/23 BCx: pending    5/23 MRSA PCR: pending 5/23 COVID-19 (-)  Thank you for allowing pharmacy to be a part of this patient's care.  Paulina Fusi, PharmD, BCPS 10/22/2018 2:11 PM

## 2018-10-22 NOTE — Progress Notes (Signed)
RSBI 37 on PSV 10/5

## 2018-10-22 NOTE — Progress Notes (Signed)
CRITICAL CARE NOTE       SUBJECTIVE FINDINGS & SIGNIFICANT EVENTS   Patient remains critically ill Prognosis is guarded Off neosynephrine this am, will attempt to stop solucortef.  ON SIMV RSBI 45 but high volume secretions will start Rubinol transiently and perform weaning trial today  PAST MEDICAL HISTORY   Past Medical History:  Diagnosis Date  . Allergy   . Anxiety   . Cataract   . Cerebral hemorrhage ([place])   . Chest pain, atypical    12/2003:  negative cardiolyte  . Depression   . Drug abuse ([place])   . Eczema   . Erectile dysfunction   . History of meniscal tear    bilateral  . HLD (hyperlipidemia)   . HTN (hypertension)   . Hx of tear of ACL (anterior cruciate ligament)    right  . Insomnia   . Olecranon bursitis of left elbow 10/2009   s/p I&D by Dr Maxie Better, initially assessed by Dr. Nori Riis   . Prediabetes   . Stroke ([place])   . Suicidal behavior 04/13/2017   Pt states taking a bunch of sleeping pills to end life   . TIA (transient ischemic attack)      SURGICAL HISTORY   Past Surgical History:  Procedure Laterality Date  . EP IMPLANTABLE DEVICE N/A 12/30/2015   Procedure: Loop Recorder Insertion;  Surgeon: Thompson Grayer, MD;  Location: [place] [facility];  Service: Cardiovascular;  Laterality: N/A;  . ESOPHAGOGASTRODUODENOSCOPY N/A 10/03/2016   Procedure: ESOPHAGOGASTRODUODENOSCOPY (EGD);  Surgeon: Irene Shipper, MD;  [facility] ENDOSCOPY;  Service: Endoscopy;  Laterality: N/A;  . INCISE AND DRAIN ABCESS     L elbow due to cellulitis/bursitis  . INNER EAR SURGERY    . IR GENERIC HISTORICAL  12/28/2015   IR ANGIO VERTEBRAL SEL VERTEBRAL UNI L MOD SED 12/28/2015 Luanne Bras, MD [facility]  . IR GENERIC HISTORICAL  12/28/2015   IR ANGIO VERTEBRAL SEL SUBCLAVIAN INNOMINATE UNI R MOD SED  12/28/2015 Luanne Bras, MD [facility]  . IR GENERIC HISTORICAL  12/28/2015   IR ANGIO INTRA EXTRACRAN SEL INTERNAL CAROTID BILAT MOD SED 12/28/2015 Luanne Bras, MD [facility]  . KNEE ARTHROSCOPY    . RADIOLOGY WITH ANESTHESIA N/A 09/20/2015   Procedure: EMBOLIZATION         (RADIOLOGY WITH ANESTHESIA);  Surgeon: Luanne Bras, MD;  Location: [place];  Service: Radiology;  Laterality: N/A;  . SKIN TAG REMOVAL     11 removed  . TEE WITHOUT CARDIOVERSION N/A 12/30/2015   Procedure: TRANSESOPHAGEAL ECHOCARDIOGRAM (TEE);  Surgeon: Larey Dresser, MD;  Location: [facility] ENDOSCOPY;  Service: Cardiovascular;  Laterality: N/A;     FAMILY HISTORY   Family History  Problem Relation Age of Onset  . Stroke Mother   . Hypertension Mother   . Aneurysm Mother 40       Died of brain aneursym  . Heart failure Father   . Emphysema Father   . Diabetes Mellitus II Sister   . Colon cancer Neg Hx   . Rectal cancer Neg Hx   . Stomach cancer Neg Hx      SOCIAL HISTORY   Social History   Tobacco Use  . Smoking status: Never Smoker  . Smokeless tobacco: Never Used  Substance Use Topics  . Alcohol use: No    Alcohol/week: 0.0 standard drinks  . Drug use: No     MEDICATIONS   Current Medication:  Current Facility-Administered Medications:  .  ceFEPIme (MAXIPIME) 2 g in sodium chloride 0.9 % 100 mL IVPB, 2 g, Intravenous, Q24H, Fritzi Mandes, MD, Stopped at 10/22/18 0026 .  chlorhexidine gluconate (MEDLINE KIT) (PERIDEX) 0.12 % solution 15 mL, 15 mL, Mouth Rinse, BID, Tukov-Yual, Magdalene S, NP, 15 mL at 10/22/18 0736 .  Chlorhexidine Gluconate Cloth 2 % PADS 6 each, 6 each, Topical, Q0600, Tyler Pita, MD, 6 each at 10/21/18 2136 .  dextrose 5 % 1,000 mL with potassium chloride 40 mEq infusion, , Intravenous, Continuous, Lateef, Munsoor, MD, Last Rate: 50 mL/hr at 10/22/18 0308 .  feeding supplement (PRO-STAT SUGAR FREE 64) liquid 60 mL, 60 mL, Per Tube, QID, Tyler Pita,  MD, 60 mL at 10/21/18 2135 .  feeding supplement (VITAL HIGH PROTEIN) liquid 1,000 mL, 1,000 mL, Per Tube, Q24H, Tyler Pita, MD, 1,000 mL at 10/21/18 1601 .  free water 200 mL, 200 mL, Per Tube, Q4H, Tyler Pita, MD, 200 mL at 10/22/18 0534 .  heparin injection 5,000 Units, 5,000 Units, Subcutaneous, Q8H, Fritzi Mandes, MD, 5,000 Units at 10/22/18 0533 .  hydrocortisone sodium succinate (SOLU-CORTEF) 100 MG injection 50 mg, 50 mg, Intravenous, Q8H, Tyler Pita, MD, 50 mg at 10/22/18 0139 .  levETIRAcetam (KEPPRA) IVPB 500 mg/100 mL premix, 500 mg, Intravenous, Q12H, Tyler Pita, MD, Stopped at 10/21/18 2153 .  MEDLINE mouth rinse, 15 mL, Mouth Rinse, 10 times per day, Tukov-Yual, Magdalene S, NP, 15 mL at 10/22/18 0935 .  multivitamin liquid 15 mL, 15 mL, Per Tube, Daily, Tyler Pita, MD, 15 mL at 10/21/18 1601 .  mupirocin ointment (BACTROBAN) 2 % 1 application, 1 application, Nasal, BID, Eyonna Sandstrom, MD .  phenylephrine (NEO-SYNEPHRINE) 10 mg in sodium chloride 0.9 % 250 mL (0.04 mg/mL) infusion, 0-400 mcg/min, Intravenous, Titrated, Fritzi Mandes, MD, Stopped at 10/22/18 [phone] .  polyethylene glycol (MIRALAX / GLYCOLAX) packet 17 g, 17 g, Oral, Daily PRN, Fritzi Mandes, MD .  propofol (DIPRIVAN) 1000 MG/100ML infusion, 5-80 mcg/kg/min, Intravenous, Continuous, Earleen Newport, MD, Last Rate: 3.62 mL/hr at 10/22/18 0356, 5 mcg/kg/min at 10/22/18 0356    ALLERGIES   Metformin    REVIEW OF SYSTEMS   Unable to obtain due to unresponsive state on mechanical ventilation  PHYSICAL EXAMINATION   Vitals:   10/22/18 0854 10/22/18 0900  BP:  (!) 147/89  Pulse:  85  Resp:  19  Temp:  98.6 F (37 C)  SpO2: 99% 98%    GENERAL: Chronically ill-appearing HEAD: Normocephalic, atraumatic.  EYES: Pupils equal, round, reactive to light.  No scleral icterus.  MOUTH: Moist mucosal membrane. NECK: Supple. No thyromegaly. No nodules. No JVD.  PULMONARY: Mild  crackles at the bases bilaterally without wheezing CARDIOVASCULAR: S1 and S2. Regular rate and rhythm. No murmurs, rubs, or gallops.  GASTROINTESTINAL: Soft, nontender, non-distended. No masses. Positive bowel sounds. No hepatosplenomegaly.  MUSCULOSKELETAL: No swelling, clubbing, or edema.  NEUROLOGIC: Mild distress due to acute illness SKIN:intact,warm,dry   LABS AND IMAGING    LAB RESULTS: Recent Labs  Lab 10/20/18 0438 10/21/18 0402 10/22/18 0532  NA 151* 150* 155*  K 4.7 3.3* 3.3*  CL 114* 114* 120*  CO2 _0 BUN 77* 60* 48*  CREATININE 6.98* 4.90* 2.62*  GLUCOSE 190* 185* 177*   Recent Labs  Lab 10/20/18 0438 10/21/18 0402 10/22/18 0532  HGB 9.2* 8.4* 8.8*  HCT 30.1* 27.8* 29.4*  WBC 17.7* 12.5* 10.9*  PLT 261 270 291     IMAGING  RESULTS: No results found.    ASSESSMENT AND PLAN    -Multidisciplinary rounds held today   Acute toxic metabolic encephalopathy  -Complicated by history of hemorrhagic CVA  -Currently intubated on mechanical ventilation  -Had spontaneous breathing trial today for 2 hours patient did pass parameters however had high-volume secretions will start Robinul and attempt to wean off mechanical ventilations tomorrow   Acute Kidney Failure - KDIGO stage 4 Nephrology on case - appreciate input  baseline creatinine of 0.53 on 08/26/18. History of proteinuria secondary to diabetic nephropathy.  -secondary to urinary obstruction/retention.    . Hypernatremia:  free water deficit of 6 liters - Increase D5W infusion to 137m/hr - free water if resuming tube feeds.  -continue Full MV support -continue Bronchodilator Therapy -Wean Fio2 and PEEP as tolerated -will perform SAT/SBT when respiratory parameters are met    Septic shock     - of urinary source as above    -urine culture pending - although may be innaccurate due to empiric antibiotic tx prior to collections  -off vasopressor support now with improved hemodynamics   NEUROLOGY - intubated and sedated - minimal sedation to achieve a RASS goal: -1 Wake up assessment pending   ID -continue IV abx as prescibed -follow up cultures  GI/Nutrition GI PROPHYLAXIS as indicated DIET-->TF's as tolerated Constipation protocol as indicated  ENDO - ICU hypoglycemic\Hyperglycemia protocol -check FSBS per protocol   ELECTROLYTES -follow labs as needed -replace as needed -pharmacy consultation   DVT/GI PRX ordered -SCDs  TRANSFUSIONS AS NEEDED MONITOR FSBS ASSESS the need for LABS as needed   Critical care provider statement:    Critical care time (minutes):  34   Critical care time was exclusive of:  Separately billable procedures and treating other patients   Critical care was necessary to treat or prevent imminent or life-threatening deterioration of the following conditions:   Septic shock of urinary source, acute toxic metabolic encephalopathy, acute kidney injury stage IV, history of hemorrhagic CVA, multiple comorbid conditions   Critical care was time spent personally by me on the following activities:  Development of treatment plan with patient or surrogate, discussions with consultants, evaluation of patient's response to treatment, examination of patient, obtaining history from patient or surrogate, ordering and performing treatments and interventions, ordering and review of laboratory studies and re-evaluation of patient's condition.  I assumed direction of critical care for this patient from another provider in my specialty: no    This document was prepared using Dragon voice recognition software and may include unintentional dictation errors.    FOttie Glazier M.D.  Division of PRives

## 2018-10-22 NOTE — Progress Notes (Signed)
Took over care at 11:30am from [facility].

## 2018-10-22 NOTE — Progress Notes (Signed)
[facility] [place] NAME: [name]    MR#:  [id number]  DATE OF BIRTH:  26-Mar-1956  SUBJECTIVE:  Patient critically ill intubated on the ventilator   Urine output 3.4 L in 24 hours patient on neosynephrine gtt REVIEW OF SYSTEMS:   Review of Systems  Unable to perform ROS: Intubated   DRUG ALLERGIES:   Allergies  Allergen Reactions  . Metformin Diarrhea    VITALS:  Blood pressure 137/68, pulse 87, temperature 99.5 F (37.5 C), resp. rate 13, height 6' 0.01" (1.829 m), weight 112.2 kg, SpO2 98 %.  PHYSICAL EXAMINATION:   Physical Exam  GENERAL:  63 y.o.-year-old patient lying in the bed with no acute distress. Critically ill EYES: Pupils equal, round, reactive to light and accommodation. No scleral icterus. Extraocular muscles intact.  HEENT: Head atraumatic, normocephalic. Oropharynx and nasopharynx clear.  NECK:  Supple, no jugular venous distention. No thyroid enlargement, no tenderness.  LUNGS: decreased breath sounds bilaterally, no wheezing, rales, rhonchi. No use of accessory muscles of respiration.  CARDIOVASCULAR: S1, S2 normal. No murmurs, rubs, or gallops.  ABDOMEN: Soft, nontender, nondistended. Bowel sounds present. No organomegaly or mass.  EXTREMITIES: No cyanosis, clubbing o ++ edema b/l.    NEUROLOGIC: intubated  PSYCHIATRIC:intubated SKIN: No obvious rash, lesion, or ulcer--per RN   LABORATORY PANEL:  CBC Recent Labs  Lab 10/22/18 0532  WBC 10.9*  HGB 8.8*  HCT 29.4*  PLT 291    Chemistries  Recent Labs  Lab 10/21/18 0402 10/22/18 0532  NA 150* 155*  K 3.3* 3.3*  CL 114* 120*  CO2 22 25  GLUCOSE 185* 177*  BUN 60* 48*  CREATININE 4.90* 2.62*  CALCIUM 7.8* 7.9*  MG  --  1.8  AST 9*  --   ALT 11  --   ALKPHOS 43  --   BILITOT 0.8  --    Cardiac Enzymes Recent Labs  Lab 10/19/18 0900  TROPONINI <0.03   RADIOLOGY:  No results found. ASSESSMENT AND PLAN:  [name]  is  a 63 y.o. male with a known history of cerebral hemorrhage, acute hypoxic respiratory failure was admitted at [facility] this year underwent head tracheostomy, history of CVA, hypertension, depression and history of drug abuse comes to the emergency room after he was found with low blood sugar of 34 at the facility. Patient received D10 why EMS came to the emergency room remains unresponsive despite sternal rub got intubated and placed on the ventilator for every protection.  1. Sepsis/septic shock due to urinary tract infection -IV fluids with D5W -broad-spectrum antibiotic with cefepime - blood culture negative -on IV neo-synephrine and solucortef  2. Acute renal failure suspected due to urinary retention withhistory of prostate enlargement (on-[facility]) -baseline creatinine 0.53 in March 2020 -nephrology consultation with Dr. Zollie Scale spoke with him -avoid nephrotoxins - renal ultrasound-showed bilateral hydronephrosis -creatinine 8.53--- 7.59-- 6.98--4.9--2.62  3. Hyperkalemia -IV dextrose with insulin, calcium gluconate, bicarb drip -resolved K 4.7  4. Acute encephalopathy due to hypoglycemia and intubated for airway protection -continue bicarb drip -CT  Head Left frontal craniotomy. Encephalomalacia in the anterior frontal 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  5. History of cerebral hemorrhage s/p clipped aneurysm. - Patient on Keppra for seizure precautions will change to IV Keppra 750 BID  6. Leukocytosis due to UTI and sepsis  7. DVT prophylaxis subcu heparin   CODE STATUS: full  DVT Prophylaxis:heparin  TOTAL critical TIME  TAKING CARE OF THIS PATIENT: *25* minutes.  >50% time spent on counselling and coordination of care  POSSIBLE D/C IN  ?DAYS, DEPENDING ON CLINICAL CONDITION.  Note: This dictation was prepared with Dragon dictation along with smaller phrase technology. Any transcriptional errors that result from this process  are unintentional.  Fritzi Mandes M.D on 10/22/2018 at 4:03 PM  Between 7am to 6pm - Pager - [phone]  After 6pm go to [email or web address] - password [email or web address] [facility]  Office  [phone]  CC: Primary care physician; Garwin Brothers, MDPatient ID: [name], male   DOB: 09/13/1955, 63 y.o.   MRN: [id number]

## 2018-10-22 NOTE — Progress Notes (Signed)
[facility]  ROUNDING NOTE   Subjective:   Tmax 100.2  UOP 3400  Na 155 (150)  D5W with 20 KCl at 44m/hr   Objective:  Vital signs in last 24 hours:  Temp:  [97.9 F (36.6 C)-100.2 F (37.9 C)] 98.6 F (37 C) (05/26 0900) Pulse Rate:  [72-95] 85 (05/26 0900) Resp:  [16-19] 19 (05/26 0900) BP: (104-147)/(58-89) 147/89 (05/26 0900) SpO2:  [93 %-100 %] 98 % (05/26 0900) FiO2 (%):  [35 %] 35 % (05/26 0854) Weight:  [112.2 kg] 112.2 kg (05/26 0445)  Weight change:  Filed Weights   10/19/18 1022 10/19/18 1309 10/22/18 0445  Weight: 120.6 kg 110.1 kg 112.2 kg    Intake/Output: I/O last 3 completed shifts: In: 2063.4 [I.V.:1523.8; NG/GT:39.7; IV Piggyback:500] Out: 5100 [Urine:5100]   Intake/Output this shift:  Total I/O In: 2943.1 [I.V.:263.1; NG/GT:2680] Out: -   Physical Exam: General: Critically ill appearing  Head: ETT OGT  Eyes: Anicteric  Neck: trachea midline  Lungs:  PRVC FiO2 35%  Heart: regular  Abdomen:  Soft   Extremities: Trace peripheral edema.  Neurologic: Intubated and sedated  Skin: No lesions       Basic Metabolic Panel: Recent Labs  Lab 10/19/18 2025 10/19/18 2306 10/20/18 0438 10/21/18 0402 10/22/18 0532  NA 148* 148* 151* 150* 155*  K 5.2* 4.9 4.7 3.3* 3.3*  CL 116* 115* 114* 114* 120*  CO2 21* 21* _0 GLUCOSE 99 123* 190* 185* 177*  BUN 81* 80* 77* 60* 48*  CREATININE 7.78* 7.59* 6.98* 4.90* 2.62*  CALCIUM 8.0* 7.8* 8.1* 7.8* 7.9*  MG  --  2.3 2.2  --  1.8  PHOS 6.2* 5.8* 5.9*  --  3.7    Liver Function Tests: Recent Labs  Lab 10/19/18 0900 10/19/18 2025 10/20/18 0438 10/21/18 0402  AST 8*  --  9* 9*  ALT 12  --  11 11  ALKPHOS 55  --  50 43  BILITOT 0.7  --  0.6 0.8  PROT 6.2*  --  5.9* 5.9*  ALBUMIN 2.5* 2.5* 2.8* 2.4*   No results for input(s): LIPASE, AMYLASE in the last 168 hours. No results for input(s): AMMONIA in the last 168 hours.  CBC: Recent Labs  Lab 10/19/18 0900  10/19/18 1357 10/20/18 0438 10/21/18 0402 10/22/18 0532  WBC 20.7* 15.5* 17.7* 12.5* 10.9*  NEUTROABS 17.8*  --   --   --  8.5*  HGB 11.4* 9.8* 9.2* 8.4* 8.8*  HCT 36.3* 33.4* 30.1* 27.8* 29.4*  MCV 86.4 91.5 88.3 88.5 90.2  PLT 310 187 261 270 291    Cardiac Enzymes: Recent Labs  Lab 10/19/18 0900  TROPONINI <0.03    BNP: Invalid input(s): POCBNP  CBG: Recent Labs  Lab 10/21/18 1626 10/21/18 2006 10/21/18 2358 10/22/18 0352 10/22/18 0744  GLUCAP 168* 181* 190* 159* 147*    Microbiology: Results for orders placed or performed during the hospital encounter of 10/19/18  SARS Coronavirus 2 ([facility]HEID - [facility] Hosp Order     Status: None   Collection Time: 10/19/18  9:28 AM  Result Value Ref Range Status   SARS Coronavirus 2 NEGATIVE NEGATIVE Final    Comment: (NOTE) If result is NEGATIVE SARS-CoV-2 target nucleic acids are NOT DETECTED. The SARS-CoV-2 RNA is generally detectable in upper and lower  respiratory specimens during the acute phase of infection. The lowest  concentration of SARS-CoV-2 viral copies this assay can detect is 250  copies / mL. A negative result does not preclude SARS-CoV-2 infection  and should not be used as the sole basis for treatment or other  patient management decisions.  A negative result may occur with  improper specimen collection / handling, submission of specimen other  than nasopharyngeal swab, presence of viral mutation(s) within the  areas targeted by this assay, and inadequate number of viral copies  (<250 copies / mL). A negative result must be combined with clinical  observations, patient history, and epidemiological information. If result is POSITIVE SARS-CoV-2 target nucleic acids are DETECTED. The SARS-CoV-2 RNA is generally detectable in upper and lower  respiratory specimens dur ing the acute phase of infection.  Positive  results are indicative of active infection with SARS-CoV-2.   Clinical  correlation with patient history and other diagnostic information is  necessary to determine patient infection status.  Positive results do  not rule out bacterial infection or co-infection with other viruses. If result is PRESUMPTIVE POSTIVE SARS-CoV-2 nucleic acids MAY BE PRESENT.   A presumptive positive result was obtained on the submitted specimen  and confirmed on repeat testing.  While 2019 novel coronavirus  (SARS-CoV-2) nucleic acids may be present in the submitted sample  additional confirmatory testing may be necessary for epidemiological  and / or clinical management purposes  to differentiate between  SARS-CoV-2 and other Sarbecovirus currently known to infect humans.  If clinically indicated additional testing with an alternate test  methodology [phone]) is advised. The SARS-CoV-2 RNA is generally  detectable in upper and lower respiratory sp ecimens during the acute  phase of infection. The expected result is Negative. Fact Sheet for Patients:  StrictlyIdeas.no Fact Sheet for Healthcare Providers: BankingDealers.co.za This test is not yet approved or cleared by the [place] FDA and has been authorized for detection and/or diagnosis of SARS-CoV-2 by FDA under an Emergency Use Authorization (EUA).  This EUA will remain in effect (meaning this test can be used) for the duration of the COVID-19 declaration under Section 564(b)(1) of the Act, 21 U.S.C. section 360bbb-3(b)(1), unless the authorization is terminated or revoked sooner. Performed at [facility], [address] [place], [place] [address]   Blood culture (routine x 2)     Status: None (Preliminary result)   Collection Time: 10/19/18  9:36 AM  Result Value Ref Range Status   Specimen Description BLOOD LUA  Final   Special Requests   Final    BOTTLES DRAWN AEROBIC AND ANAEROBIC Blood Culture adequate volume   Culture   Final    NO GROWTH  3 DAYS Performed at [facility], [address] [place], [place] [address]    Report Status PENDING  Incomplete  Blood culture (routine x 2)     Status: None (Preliminary result)   Collection Time: 10/19/18 10:16 AM  Result Value Ref Range Status   Specimen Description BLOOD LFA  Final   Special Requests   Final    BOTTLES DRAWN AEROBIC AND ANAEROBIC Blood Culture results may not be optimal due to an inadequate volume of blood received in culture bottles   Culture   Final    NO GROWTH 3 DAYS Performed at [facility], [address] [place], [place] [address]    Report Status PENDING  Incomplete  MRSA PCR Screening     Status: Abnormal   Collection Time: 10/19/18  1:06 PM  Result Value Ref Range Status   MRSA by PCR POSITIVE (A) NEGATIVE Final  Comment:        The GeneXpert MRSA Assay (FDA approved for NASAL specimens only), is one component of a comprehensive MRSA colonization surveillance program. It is not intended to diagnose MRSA infection nor to guide or monitor treatment for MRSA infections. CRITICAL RESULT CALLED TO, READ BACK BY AND VERIFIED WITH: CALLED TO TANYA SILVA _0  10/19/2018 [facility] Performed at [facility], [address] [place], [place] [address]     Coagulation Studies: No results for input(s): LABPROT, INR in the last 72 hours.  Urinalysis: No results for input(s): COLORURINE, LABSPEC, PHURINE, GLUCOSEU, HGBUR, BILIRUBINUR, KETONESUR, PROTEINUR, UROBILINOGEN, NITRITE, LEUKOCYTESUR in the last 72 hours.  Invalid input(s): APPERANCEUR    Imaging: No results found.   Medications:   . ceFEPime (MAXIPIME) IV Stopped (10/22/18 0026)  . dextrose 5 % with kcl 50 mL/hr at 10/22/18 0308  . levETIRAcetam Stopped (10/21/18 2153)  . phenylephrine (NEO-SYNEPHRINE) Adult infusion Stopped (10/22/18 0737)  . propofol (DIPRIVAN) infusion 5 mcg/kg/min (10/22/18 0356)   . chlorhexidine gluconate (MEDLINE KIT)  15 mL Mouth  Rinse BID  . Chlorhexidine Gluconate Cloth  6 each Topical Q0600  . feeding supplement (PRO-STAT SUGAR FREE 64)  60 mL Per Tube QID  . feeding supplement (VITAL HIGH PROTEIN)  1,000 mL Per Tube Q24H  . free water  200 mL Per Tube Q4H  . heparin  5,000 Units Subcutaneous Q8H  . hydrocortisone sod succinate (SOLU-CORTEF) inj  50 mg Intravenous Q8H  . mouth rinse  15 mL Mouth Rinse 10 times per day  . multivitamin  15 mL Per Tube Daily  . mupirocin ointment  1 application Nasal BID   polyethylene glycol  Assessment/ Plan:  [name] is a 63 y.o. white male with cerebral hemorrhage, depression, erectile dysfunction, hyperlipidemia, hypertension, CVA, who was admitted to [facility] on 10/19/2018 for evaluation of altered mental status and hypoglycemia while at a nursing home.   Patient with relatively recent admission to [facility] where patient had tracheostomy in the setting of cerebral aneurysm.  1.  Acute renal failure: baseline creatinine of 0.53 on 08/26/18. History of proteinuria secondary to diabetic nephropathy.  Acute renal failure secondary to urinary obstruction/retention.  Nonoliguric.   2. Hypernatremia: free water deficit of 6 liters - Increase D5W infusion to 155m/hr - free water if resuming tube feeds.    LOS: 3 Gaelan Glennon 5/26/202011:48 AM

## 2018-10-23 DIAGNOSIS — L8990 Pressure ulcer of unspecified site, unspecified stage: Secondary | ICD-10-CM

## 2018-10-23 LAB — PROTEIN ELECTROPHORESIS, SERUM
A/G Ratio: 0.7 (ref 0.7–1.7)
Albumin ELP: 2.2 g/dL — ABNORMAL LOW (ref 2.9–4.4)
Alpha-1-Globulin: 0.4 g/dL (ref 0.0–0.4)
Alpha-2-Globulin: 1 g/dL (ref 0.4–1.0)
Beta Globulin: 0.9 g/dL (ref 0.7–1.3)
Gamma Globulin: 0.8 g/dL (ref 0.4–1.8)
Globulin, Total: 3 g/dL (ref 2.2–3.9)
Total Protein ELP: 5.2 g/dL — ABNORMAL LOW (ref 6.0–8.5)

## 2018-10-23 LAB — PROTEIN ELECTRO, RANDOM URINE
Albumin ELP, Urine: 43.4 %
Alpha-1-Globulin, U: 5.9 %
Alpha-2-Globulin, U: 16.2 %
Beta Globulin, U: 20.7 %
Gamma Globulin, U: 13.8 %
M Component, Ur: 8.9 % — ABNORMAL HIGH
Total Protein, Urine: 1999.7 mg/dL

## 2018-10-23 LAB — MAGNESIUM: Magnesium: 1.4 mg/dL — ABNORMAL LOW (ref 1.7–2.4)

## 2018-10-23 LAB — CBC
HCT: 26.6 % — ABNORMAL LOW (ref 39.0–52.0)
Hemoglobin: 8.1 g/dL — ABNORMAL LOW (ref 13.0–17.0)
MCH: 27.1 pg (ref 26.0–34.0)
MCHC: 30.5 g/dL (ref 30.0–36.0)
MCV: 89 fL (ref 80.0–100.0)
Platelets: 289 10*3/uL (ref 150–400)
RBC: 2.99 MIL/uL — ABNORMAL LOW (ref 4.22–5.81)
RDW: 15.3 % (ref 11.5–15.5)
WBC: 9.2 10*3/uL (ref 4.0–10.5)
nRBC: 0 % (ref 0.0–0.2)

## 2018-10-23 LAB — BASIC METABOLIC PANEL
Anion gap: 10 (ref 5–15)
BUN: 28 mg/dL — ABNORMAL HIGH (ref 8–23)
CO2: 24 mmol/L (ref 22–32)
Calcium: 7.1 mg/dL — ABNORMAL LOW (ref 8.9–10.3)
Chloride: 114 mmol/L — ABNORMAL HIGH (ref 98–111)
Creatinine, Ser: 1.49 mg/dL — ABNORMAL HIGH (ref 0.61–1.24)
GFR calc Af Amer: 57 mL/min — ABNORMAL LOW (ref >60)
GFR calc non Af Amer: 49 mL/min — ABNORMAL LOW (ref >60)
Glucose, Bld: 165 mg/dL — ABNORMAL HIGH (ref 70–99)
Potassium: 2.9 mmol/L — ABNORMAL LOW (ref 3.5–5.1)
Sodium: 148 mmol/L — ABNORMAL HIGH (ref 135–145)

## 2018-10-23 LAB — GLUCOSE, CAPILLARY
Glucose-Capillary: 150 mg/dL — ABNORMAL HIGH (ref 70–99)
Glucose-Capillary: 153 mg/dL — ABNORMAL HIGH (ref 70–99)
Glucose-Capillary: 134 mg/dL — ABNORMAL HIGH (ref 70–99)
Glucose-Capillary: 156 mg/dL — ABNORMAL HIGH (ref 70–99)
Glucose-Capillary: 159 mg/dL — ABNORMAL HIGH (ref 70–99)
Glucose-Capillary: 162 mg/dL — ABNORMAL HIGH (ref 70–99)

## 2018-10-23 LAB — POTASSIUM: Potassium: 3.1 mmol/L — ABNORMAL LOW (ref 3.5–5.1)

## 2018-10-23 MED ORDER — SODIUM CHLORIDE 0.9 % IV SOLN
2.0000 g | Freq: Three times a day (TID) | INTRAVENOUS | Status: DC
  Administered 2018-10-23 – 2018-10-24 (×5): 2 g via INTRAVENOUS
  Filled 2018-10-23 (×8): qty 2

## 2018-10-23 MED ORDER — POTASSIUM CHLORIDE 20 MEQ/15ML (10%) PO SOLN
40.0000 meq | Freq: Once | ORAL | Status: AC
  Administered 2018-10-23: 40 meq via ORAL
  Filled 2018-10-23: qty 30

## 2018-10-23 MED ORDER — POTASSIUM CHLORIDE 10 MEQ/100ML IV SOLN
10.0000 meq | INTRAVENOUS | Status: AC
  Administered 2018-10-23 (×4): 10 meq via INTRAVENOUS
  Filled 2018-10-23 (×4): qty 100

## 2018-10-23 NOTE — Progress Notes (Signed)
Pt remains sedated and intubated. Tube feeding infusing. No acute events.

## 2018-10-23 NOTE — Progress Notes (Signed)
[facility]  ROUNDING NOTE   Subjective:   Afebrile  UOP 3600  Na 148 (155) (150)  D5W with 20 KCl at 73m/hr   Objective:  Vital signs in last 24 hours:  Temp:  [98.4 F (36.9 C)-99.7 F (37.6 C)] 99.7 F (37.6 C) (05/27 1100) Pulse Rate:  [61-95] 83 (05/27 1100) Resp:  [13-24] 24 (05/27 1100) BP: (99-149)/(59-89) 133/75 (05/27 1100) SpO2:  [96 %-100 %] 100 % (05/27 1100) FiO2 (%):  [35 %] 35 % (05/27 0745) Weight:  [107.5 kg] 107.5 kg (05/27 0500)  Weight change: -4.7 kg Filed Weights   10/19/18 1309 10/22/18 0445 10/23/18 0500  Weight: 110.1 kg 112.2 kg 107.5 kg    Intake/Output: I/O last 3 completed shifts: In: 6203.3 [I.V.:3000; NG/GT:2680; IV Piggyback:523.3] Out: 4950 [Urine:4950]   Intake/Output this shift:  Total I/O In: 1197.4 [I.V.:500.1; NG/GT:520; IV Piggyback:177.3] Out: -   Physical Exam: General: Critically ill appearing  Head: ETT OGT  Eyes: Anicteric  Neck: trachea midline  Lungs:  PRVC FiO2 35%  Heart: regular  Abdomen:  Soft   Extremities: Trace peripheral edema.  Neurologic: Intubated and sedated  Skin: No lesions       Basic Metabolic Panel: Recent Labs  Lab 10/19/18 2025 10/19/18 2306 10/20/18 0438 10/21/18 0402 10/22/18 0532 10/23/18 0246 10/23/18 1002  NA 148* 148* 151* 150* 155* 148*  --   K 5.2* 4.9 4.7 3.3* 3.3* 2.9* 3.1*  CL 116* 115* 114* 114* 120* 114*  --   CO2 21* 21* '25 22 25 24  '$ --   GLUCOSE 99 123* 190* 185* 177* 165*  --   BUN 81* 80* 77* 60* 48* 28*  --   CREATININE 7.78* 7.59* 6.98* 4.90* 2.62* 1.49*  --   CALCIUM 8.0* 7.8* 8.1* 7.8* 7.9* 7.1*  --   MG  --  2.3 2.2  --  1.8 1.4*  --   PHOS 6.2* 5.8* 5.9*  --  3.7  --   --     Liver Function Tests: Recent Labs  Lab 10/19/18 0900 10/19/18 2025 10/20/18 0438 10/21/18 0402  AST 8*  --  9* 9*  ALT 12  --  11 11  ALKPHOS 55  --  50 43  BILITOT 0.7  --  0.6 0.8  PROT 6.2*  --  5.9* 5.9*  ALBUMIN 2.5* 2.5* 2.8* 2.4*   No results  for input(s): LIPASE, AMYLASE in the last 168 hours. No results for input(s): AMMONIA in the last 168 hours.  CBC: Recent Labs  Lab 10/19/18 0900 10/19/18 1357 10/20/18 0438 10/21/18 0402 10/22/18 0532 10/23/18 0246  WBC 20.7* 15.5* 17.7* 12.5* 10.9* 9.2  NEUTROABS 17.8*  --   --   --  8.5*  --   HGB 11.4* 9.8* 9.2* 8.4* 8.8* 8.1*  HCT 36.3* 33.4* 30.1* 27.8* 29.4* 26.6*  MCV 86.4 91.5 88.3 88.5 90.2 89.0  PLT 310 187 261 270 291 289    Cardiac Enzymes: Recent Labs  Lab 10/19/18 0900  TROPONINI <0.03    BNP: Invalid input(s): POCBNP  CBG: Recent Labs  Lab 10/22/18 1953 10/22/18 2309 10/23/18 0303 10/23/18 0759 10/23/18 1112  GLUCAP 184* 176* 150* 153* 134*    Microbiology: Results for orders placed or performed during the hospital encounter of 10/19/18  SARS Coronavirus 2 (CEPHEID - [facility] Hosp Order     Status: None   Collection Time: 10/19/18  9:28 AM  Result Value Ref Range Status  SARS Coronavirus 2 NEGATIVE NEGATIVE Final    Comment: (NOTE) If result is NEGATIVE SARS-CoV-2 target nucleic acids are NOT DETECTED. The SARS-CoV-2 RNA is generally detectable in upper and lower  respiratory specimens during the acute phase of infection. The lowest  concentration of SARS-CoV-2 viral copies this assay can detect is 250  copies / mL. A negative result does not preclude SARS-CoV-2 infection  and should not be used as the sole basis for treatment or other  patient management decisions.  A negative result may occur with  improper specimen collection / handling, submission of specimen other  than nasopharyngeal swab, presence of viral mutation(s) within the  areas targeted by this assay, and inadequate number of viral copies  (<250 copies / mL). A negative result must be combined with clinical  observations, patient history, and epidemiological information. If result is POSITIVE SARS-CoV-2 target nucleic acids are DETECTED. The  SARS-CoV-2 RNA is generally detectable in upper and lower  respiratory specimens dur ing the acute phase of infection.  Positive  results are indicative of active infection with SARS-CoV-2.  Clinical  correlation with patient history and other diagnostic information is  necessary to determine patient infection status.  Positive results do  not rule out bacterial infection or co-infection with other viruses. If result is PRESUMPTIVE POSTIVE SARS-CoV-2 nucleic acids MAY BE PRESENT.   A presumptive positive result was obtained on the submitted specimen  and confirmed on repeat testing.  While 2019 novel coronavirus  (SARS-CoV-2) nucleic acids may be present in the submitted sample  additional confirmatory testing may be necessary for epidemiological  and / or clinical management purposes  to differentiate between  SARS-CoV-2 and other Sarbecovirus currently known to infect humans.  If clinically indicated additional testing with an alternate test  methodology [phone]) is advised. The SARS-CoV-2 RNA is generally  detectable in upper and lower respiratory sp ecimens during the acute  phase of infection. The expected result is Negative. Fact Sheet for Patients:  StrictlyIdeas.no Fact Sheet for Healthcare Providers: BankingDealers.co.za This test is not yet approved or cleared by the [place] FDA and has been authorized for detection and/or diagnosis of SARS-CoV-2 by FDA under an Emergency Use Authorization (EUA).  This EUA will remain in effect (meaning this test can be used) for the duration of the COVID-19 declaration under Section 564(b)(1) of the Act, 21 U.S.C. section 360bbb-3(b)(1), unless the authorization is terminated or revoked sooner. Performed at [facility], [address] [place], [place] [address]   Blood culture (routine x 2)     Status: None (Preliminary result)   Collection Time: 10/19/18  9:36 AM  Result  Value Ref Range Status   Specimen Description BLOOD LUA  Final   Special Requests   Final    BOTTLES DRAWN AEROBIC AND ANAEROBIC Blood Culture adequate volume   Culture   Final    NO GROWTH 4 DAYS Performed at [facility], [address] [place], [place] [address]    Report Status PENDING  Incomplete  Blood culture (routine x 2)     Status: None (Preliminary result)   Collection Time: 10/19/18 10:16 AM  Result Value Ref Range Status   Specimen Description BLOOD LFA  Final   Special Requests   Final    BOTTLES DRAWN AEROBIC AND ANAEROBIC Blood Culture results may not be optimal due to an inadequate volume of blood received in culture bottles   Culture   Final    NO GROWTH 4 DAYS  Performed at [facility], [address] [place], [place] [address]    Report Status PENDING  Incomplete  MRSA PCR Screening     Status: Abnormal   Collection Time: 10/19/18  1:06 PM  Result Value Ref Range Status   MRSA by PCR POSITIVE (A) NEGATIVE Final    Comment:        The GeneXpert MRSA Assay (FDA approved for NASAL specimens only), is one component of a comprehensive MRSA colonization surveillance program. It is not intended to diagnose MRSA infection nor to guide or monitor treatment for MRSA infections. CRITICAL RESULT CALLED TO, READ BACK BY AND VERIFIED WITH: CALLED TO TANYA SILVA '@[id number]'$  10/19/2018 [facility] Performed at [facility], [address], [place], [place] [address]   Urine Culture     Status: None   Collection Time: 10/21/18  4:10 PM  Result Value Ref Range Status   Specimen Description   Final    URINE, CATHETERIZED Performed at [facility] [place] [facility], [address], [place], [place] [address]    Special Requests   Final    Normal Performed at [facility], [address] [place], [place] [address]    Culture   Final    NO GROWTH Performed at [facility], [place] [address], [place], [place] [address]    Report Status  10/22/2018 FINAL  Final    Coagulation Studies: No results for input(s): LABPROT, INR in the last 72 hours.  Urinalysis: No results for input(s): COLORURINE, LABSPEC, PHURINE, GLUCOSEU, HGBUR, BILIRUBINUR, KETONESUR, PROTEINUR, UROBILINOGEN, NITRITE, LEUKOCYTESUR in the last 72 hours.  Invalid input(s): APPERANCEUR    Imaging: No results found.   Medications:   . ceFEPime (MAXIPIME) IV Stopped (10/23/18 0601)  . dextrose 5 % with kcl 100 mL/hr at 10/23/18 1000  . levETIRAcetam Stopped (10/23/18 0931)  . phenylephrine (NEO-SYNEPHRINE) Adult infusion Stopped (10/22/18 0737)  . potassium chloride 10 mEq (10/23/18 1247)  . propofol (DIPRIVAN) infusion 10 mcg/kg/min (10/23/18 1151)   . chlorhexidine gluconate (MEDLINE KIT)  15 mL Mouth Rinse BID  . Chlorhexidine Gluconate Cloth  6 each Topical Q0600  . feeding supplement (PRO-STAT SUGAR FREE 64)  60 mL Per Tube QID  . feeding supplement (VITAL HIGH PROTEIN)  1,000 mL Per Tube Q24H  . free water  200 mL Per Tube Q4H  . heparin  5,000 Units Subcutaneous Q8H  . hydrocortisone sod succinate (SOLU-CORTEF) inj  50 mg Intravenous Q8H  . mouth rinse  15 mL Mouth Rinse 10 times per day  . multivitamin  15 mL Per Tube Daily  . mupirocin ointment  1 application Nasal BID   polyethylene glycol  Assessment/ Plan:  [name] is a 63 y.o. white male with cerebral hemorrhage, depression, erectile dysfunction, hyperlipidemia, hypertension, CVA, who was admitted to [facility] on 10/19/2018 for evaluation of altered mental status and hypoglycemia while at a nursing home.   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  1.  Acute renal failure: baseline creatinine of 0.53 on 08/26/18. History of proteinuria secondary to diabetic nephropathy.  Acute renal failure secondary to urinary obstruction/retention.  Nonoliguric.  Creatinine is improving.   2. Hypernatremia:  free water deficit  - Continue D5W infusion to 181m/hr - free water if resuming tube feeds.   3. Hypokalemia - IV potassium replacement    LOS: 4 Merton Wadlow 5/27/202012:47 PM

## 2018-10-23 NOTE — Progress Notes (Signed)
CRITICAL CARE NOTE       SUBJECTIVE FINDINGS & SIGNIFICANT EVENTS   Patient remains critically ill Prognosis is guarded  We had initiated spontaneous breathing trial to attempt to wean patient off mechanical ventilation however patient was severely tachypneic and tachycardic and was unable to be weaned today we will attempt again tomorrow.  PAST MEDICAL HISTORY   Past Medical History:  Diagnosis Date  . Allergy   . Anxiety   . Cataract   . Cerebral hemorrhage ([place])   . Chest pain, atypical    12/2003:  negative cardiolyte  . Depression   . Drug abuse ([place])   . Eczema   . Erectile dysfunction   . History of meniscal tear    bilateral  . HLD (hyperlipidemia)   . HTN (hypertension)   . Hx of tear of ACL (anterior cruciate ligament)    right  . Insomnia   . Olecranon bursitis of left elbow 10/2009   s/p I&D by Dr Maxie Better, initially assessed by Dr. Nori Riis   . Prediabetes   . Stroke ([place])   . Suicidal behavior 04/13/2017   Pt states taking a bunch of sleeping pills to end life   . TIA (transient ischemic attack)      SURGICAL HISTORY   Past Surgical History:  Procedure Laterality Date  . EP IMPLANTABLE DEVICE N/A 12/30/2015   Procedure: Loop Recorder Insertion;  Surgeon: Thompson Grayer, MD;  Location: [place] CV LAB;  Service: Cardiovascular;  Laterality: N/A;  . ESOPHAGOGASTRODUODENOSCOPY N/A 10/03/2016   Procedure: ESOPHAGOGASTRODUODENOSCOPY (EGD);  Surgeon: Irene Shipper, MD;  Location: [facility] ENDOSCOPY;  Service: Endoscopy;  Laterality: N/A;  . INCISE AND DRAIN ABCESS     L elbow due to cellulitis/bursitis  . INNER EAR SURGERY    . IR GENERIC HISTORICAL  12/28/2015   IR ANGIO VERTEBRAL SEL VERTEBRAL UNI L MOD SED 12/28/2015 Luanne Bras, MD [facility]INTER[facility] RAD  . IR GENERIC HISTORICAL  12/28/2015   IR ANGIO  VERTEBRAL SEL SUBCLAVIAN INNOMINATE UNI R MOD SED 12/28/2015 Luanne Bras, MD [facility]  . IR GENERIC HISTORICAL  12/28/2015   IR ANGIO INTRA EXTRACRAN SEL INTERNAL CAROTID BILAT MOD SED 12/28/2015 Luanne Bras, MD [facility]  . KNEE ARTHROSCOPY    . RADIOLOGY WITH ANESTHESIA N/A 09/20/2015   Procedure: EMBOLIZATION         (RADIOLOGY WITH ANESTHESIA);  Surgeon: Luanne Bras, MD;  Location: [place];  Service: Radiology;  Laterality: N/A;  . SKIN TAG REMOVAL     11 removed  . TEE WITHOUT CARDIOVERSION N/A 12/30/2015   Procedure: TRANSESOPHAGEAL ECHOCARDIOGRAM (TEE);  Surgeon: Larey Dresser, MD;  Location: [facility] ENDOSCOPY;  Service: Cardiovascular;  Laterality: N/A;     FAMILY HISTORY   Family History  Problem Relation Age of Onset  . Stroke Mother   . Hypertension Mother   . Aneurysm Mother 29       Died of brain aneursym  . Heart failure Father   . Emphysema Father   . Diabetes Mellitus II Sister   . Colon cancer Neg Hx   . Rectal cancer Neg Hx   . Stomach cancer Neg Hx      SOCIAL HISTORY   Social History   Tobacco Use  . Smoking status: Never Smoker  . Smokeless tobacco: Never Used  Substance Use Topics  . Alcohol use: No    Alcohol/week: 0.0 standard drinks  . Drug use: No     MEDICATIONS   Current Medication:  Current Facility-Administered Medications:  .  ceFEPIme (MAXIPIME) 2 g in sodium chloride 0.9 % 100 mL IVPB, 2 g, Intravenous, Q8H, Fritzi Mandes, MD, Stopped at 10/23/18 0601 .  chlorhexidine gluconate (MEDLINE KIT) (PERIDEX) 0.12 % solution 15 mL, 15 mL, Mouth Rinse, BID, Tukov-Yual, Magdalene S, NP, 15 mL at 10/23/18 0900 .  Chlorhexidine Gluconate Cloth 2 % PADS 6 each, 6 each, Topical, Q0600, Tyler Pita, MD, 6 each at 10/23/18 0531 .  dextrose 5 % 1,000 mL with potassium chloride 40 mEq infusion, , Intravenous, Continuous, Kolluru, Sarath, MD, Last Rate: 100 mL/hr at 10/23/18 1000 .  feeding supplement (PRO-STAT SUGAR FREE 64) liquid  60 mL, 60 mL, Per Tube, QID, Tyler Pita, MD, 60 mL at 10/23/18 0915 .  feeding supplement (VITAL HIGH PROTEIN) liquid 1,000 mL, 1,000 mL, Per Tube, Q24H, Tyler Pita, MD, 1,000 mL at 10/23/18 0201 .  free water 200 mL, 200 mL, Per Tube, Q4H, Vernard Gambles L, MD, 200 mL at 10/23/18 0915 .  heparin injection 5,000 Units, 5,000 Units, Subcutaneous, Q8H, Fritzi Mandes, MD, 5,000 Units at 10/23/18 0531 .  hydrocortisone sodium succinate (SOLU-CORTEF) 100 MG injection 50 mg, 50 mg, Intravenous, Q8H, Tyler Pita, MD, 50 mg at 10/23/18 0915 .  levETIRAcetam (KEPPRA) IVPB 500 mg/100 mL premix, 500 mg, Intravenous, Q12H, Tyler Pita, MD, Stopped at 10/23/18 [phone] .  MEDLINE mouth rinse, 15 mL, Mouth Rinse, 10 times per day, Tukov-Yual, Magdalene S, NP, 15 mL at 10/23/18 0923 .  multivitamin liquid 15 mL, 15 mL, Per Tube, Daily, Tyler Pita, MD, 15 mL at 10/23/18 0914 .  mupirocin ointment (BACTROBAN) 2 % 1 application, 1 application, Nasal, BID, Ottie Glazier, MD, 1 application at 32/12/24 0915 .  phenylephrine (NEO-SYNEPHRINE) 10 mg in sodium chloride 0.9 % 250 mL (0.04 mg/mL) infusion, 0-400 mcg/min, Intravenous, Titrated, Fritzi Mandes, MD, Stopped at 10/22/18 [phone] .  polyethylene glycol (MIRALAX / GLYCOLAX) packet 17 g, 17 g, Oral, Daily PRN, Fritzi Mandes, MD .  potassium chloride 10 mEq in 100 mL IVPB, 10 mEq, Intravenous, Q1 Hr x 4, Kolluru, Sarath, MD .  propofol (DIPRIVAN) 1000 MG/100ML infusion, 5-80 mcg/kg/min, Intravenous, Continuous, Earleen Newport, MD, Last Rate: 18.09 mL/hr at 10/23/18 1000, 25 mcg/kg/min at 10/23/18 1000    ALLERGIES   Metformin    REVIEW OF SYSTEMS     Unable to obtain ROS due to critical illness  PHYSICAL EXAMINATION   Vitals:   10/23/18 0900 10/23/18 1000  BP: 99/67 103/66  Pulse: 69 61  Resp: 16 16  Temp: 99.1 F (37.3 C) 99 F (37.2 C)  SpO2: 98% 98%    GENERAL: Sedated on mechanical ventilation HEAD:  Normocephalic, atraumatic.  EYES: Pupils equal, round, reactive to light.  No scleral icterus.  MOUTH: Moist mucosal membrane. NECK: Supple. No thyromegaly. No nodules. No JVD.  PULMONARY: Mild bibasilar crackles CARDIOVASCULAR: S1 and S2. Regular rate and rhythm. No murmurs, rubs, or gallops.  GASTROINTESTINAL: Soft, nontender, non-distended. No masses. Positive bowel sounds. No hepatosplenomegaly.  MUSCULOSKELETAL: No swelling, clubbing, or edema.  NEUROLOGIC: Mild distress due to acute illness SKIN:intact,warm,dry   LABS AND IMAGING     LAB RESULTS: Recent Labs  Lab 10/21/18 0402 10/22/18 0532 10/23/18 0246 10/23/18 1002  NA 150* 155* 148*  --   K 3.3* 3.3* 2.9* 3.1*  CL 114* 120* 114*  --   CO2 _0 --   BUN 60* 48* 28*  --  CREATININE 4.90* 2.62* 1.49*  --   GLUCOSE 185* 177* 165*  --    Recent Labs  Lab 10/21/18 0402 10/22/18 0532 10/23/18 0246  HGB 8.4* 8.8* 8.1*  HCT 27.8* 29.4* 26.6*  WBC 12.5* 10.9* 9.2  PLT 270 291 289     IMAGING RESULTS: No results found.    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 History of proteinuria secondary to diabetic nephropathy.  -secondary to urinary obstruction/retention.     Hypernatremia:  free water deficit of 6 liters - Increase D5W infusion to 164m/hr - free water if resuming tube feeds. -continue Full MV support -continue Bronchodilator Therapy -Wean Fio2 and PEEP as tolerated -will perform SAT/SBT when respiratory parameters  are met    Septic shock     - of urinary source as above    -urine culture pending - although may be innaccurate due to empiric antibiotic tx prior to collections  -off vasopressor support now with improved hemodynamics  NEUROLOGY - intubated and sedated - minimal sedation to achieve a RASS goal: -1 Wake up assessment pending   ID -continue IV abx as prescibed -follow up cultures  GI/Nutrition GI PROPHYLAXIS as indicated DIET-->TF's as tolerated Constipation protocol as indicated  ENDO - ICU hypoglycemic\Hyperglycemia protocol -check FSBS per protocol   ELECTROLYTES -follow labs as needed -replace as needed -pharmacy consultation   DVT/GI PRX ordered -SCDs  TRANSFUSIONS AS NEEDED MONITOR FSBS ASSESS the need for LABS as needed   Critical care provider statement:   Critical care time (minutes): 33  Critical care time was exclusive of: Separately billable procedures and treating other patients  Critical care was necessary to treat or prevent imminent or life-threatening deterioration of the following conditions:  Septic shock of urinary source, acute toxic metabolic encephalopathy, acute kidney injury stage IV, history of hemorrhagic CVA, multiple comorbid conditions  Critical care was time spent personally by me on the following activities: Development of treatment plan with patient or surrogate, discussions with consultants, evaluation of patient's response to treatment, examination of patient, obtaining history from patient or surrogate, ordering and performing treatments and interventions, ordering and review of laboratory studies and re-evaluation of patient's condition.  I assumed direction of critical care for this patient from another provider in my specialty: no   This document was prepared using Dragon voice recognition software and may include unintentional dictation errors.    FOttie Glazier M.D.  Division of [facility]

## 2018-10-23 NOTE — Progress Notes (Signed)
PHARMACY NOTE:  ANTIMICROBIAL RENAL DOSAGE ADJUSTMENT  Current antimicrobial regimen includes a mismatch between antimicrobial dosage and estimated renal function.  As per policy approved by the Pharmacy & Therapeutics and Medical Executive Committees, the antimicrobial dosage will be adjusted accordingly.  Current antimicrobial dosage:  Cefepime 2g q24H   Indication: sepsis due to UTI  Renal Function:  Estimated Creatinine Clearance: 64.8 mL/min (A) (by C-G formula based on SCr of 1.49 mg/dL (H)).    Antimicrobial dosage has been changed to:  Cefepime 2 g q8H    Thank you for allowing pharmacy to be a part of this patient's care.  Oswald Hillock, [facility] 10/23/2018 5:01 AM

## 2018-10-23 NOTE — Progress Notes (Signed)
[facility] [facility] [place] NAME: [name]    MR#:  [id number]  DATE OF BIRTH:  01-Jul-1955  SUBJECTIVE:  Patient critically ill intubated on the ventilator   Urine output 3.4 L in 24 hours patient now off Neosynephrine gtt  Failed SBT this morning. REVIEW OF SYSTEMS:   Review of Systems  Unable to perform ROS: Intubated   DRUG ALLERGIES:   Allergies  Allergen Reactions  . Metformin Diarrhea    VITALS:  Blood pressure (!) 161/91, pulse 90, temperature 100 F (37.8 C), temperature source Bladder, resp. rate 14, height 6' 0.01" (1.829 m), weight 107.5 kg, SpO2 99 %.  PHYSICAL EXAMINATION:   Physical Exam  GENERAL:  63 y.o.-year-old patient lying in the bed with no acute distress. Critically ill EYES: Pupils equal, round, reactive to light and accommodation. No scleral icterus. Extraocular muscles intact.  HEENT: Head atraumatic, normocephalic. Oropharynx and nasopharynx clear.  NECK:  Supple, no jugular venous distention. No thyroid enlargement, no tenderness.  LUNGS: decreased breath sounds bilaterally, no wheezing, rales, rhonchi. No use of accessory muscles of respiration.  CARDIOVASCULAR: S1, S2 normal. No murmurs, rubs, or gallops.  ABDOMEN: Soft, nontender, nondistended. Bowel sounds present. No organomegaly or mass.  EXTREMITIES: No cyanosis, clubbing o ++ edema b/l.    NEUROLOGIC: intubated  PSYCHIATRIC:intubated SKIN: No obvious rash, lesion, or ulcer--per RN   LABORATORY PANEL:  CBC Recent Labs  Lab 10/23/18 0246  WBC 9.2  HGB 8.1*  HCT 26.6*  PLT 289    Chemistries  Recent Labs  Lab 10/21/18 0402  10/23/18 0246 10/23/18 1002  NA 150*   < > 148*  --   K 3.3*   < > 2.9* 3.1*  CL 114*   < > 114*  --   CO2 22   < > 24  --   GLUCOSE 185*   < > 165*  --   BUN 60*   < > 28*  --   CREATININE 4.90*   < > 1.49*  --   CALCIUM 7.8*   < > 7.1*  --   MG  --    < > 1.4*  --   AST 9*  --   --   --   ALT 11   --   --   --   ALKPHOS 43  --   --   --   BILITOT 0.8  --   --   --    < > = values in this interval not displayed.   Cardiac Enzymes Recent Labs  Lab 10/19/18 0900  TROPONINI <0.03   RADIOLOGY:  No results found. ASSESSMENT AND PLAN:  [name]  is a 63 y.o. male with a known history of cerebral hemorrhage, acute hypoxic respiratory failure was admitted at [facility] this year underwent head tracheostomy, history of CVA, hypertension, depression and history of drug abuse comes to the emergency room after he was found with low blood sugar of 34 at the facility. Patient received D10 why EMS came to the emergency room remains unresponsive despite sternal rub got intubated and placed on the ventilator for every protection.  1. Sepsis/septic shock due to urinary tract infection however UC negative -broad-spectrum antibiotic with cefepime - blood culture negative -off IV neo-synephrine -cont IV solucortef  2. Acute renal failure suspected due to urinary retention withhistory of prostate enlargement (on-Cardura) -baseline creatinine 0.53 in March 2020 -nephrology consultation with Dr. Zollie Scale spoke with him -avoid  nephrotoxins - renal ultrasound-showed bilateral hydronephrosis -creatinine 8.53--- 7.59-- 6.98--4.9--2.62--1.49  3. Hyperkalemia/ hypernatremia -IV dextrose with insulin, calcium gluconate, bicarb drip -resolved K 4.7 -sodium trending down 150--148  4. Acute encephalopathy due to hypoglycemia and intubated for airway protection -CT  Head Left frontal craniotomy. Encephalomalacia in the anterior frontal 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  5. History of cerebral hemorrhage s/p clipped aneurysm. - Patient on Keppra for seizure precautions will change to IV Keppra 750 BID  6. DVT prophylaxis subcu heparin   CODE STATUS: full  DVT Prophylaxis:heparin  TOTAL critical TIME TAKING CARE OF THIS PATIENT: *25* minutes.  >50%  time spent on counselling and coordination of care  POSSIBLE D/C IN  ?DAYS, DEPENDING ON CLINICAL CONDITION.  Note: This dictation was prepared with Dragon dictation along with smaller phrase technology. Any transcriptional errors that result from this process are unintentional.  Fritzi Mandes M.D on 10/23/2018 at 1:36 PM  Between 7am to 6pm - Pager - [phone]  After 6pm go to [email or web address] - password [email or web address] [facility]  Office  [phone]  CC: Primary care physician; [name], MDPatient ID: [name], male   DOB: 01/14/1956, 63 y.o.   MRN: [id number]

## 2018-10-23 NOTE — Progress Notes (Signed)
Request sent to Dr Aleskerov to consider adding GI prophylaxis to this patient's profile. 

## 2018-10-23 NOTE — Progress Notes (Signed)
Pharmacy Antibiotic Note  [name] is a 63 y.o. male admitted on 10/19/2018 with UTI.  PMH significant for CVA, HTN, tracheostomy. Patient presented with low blood sugar and was unresponsive. Baseline Scr from [facility] in 07/2018 was ~0.55.  Pharmacy has been consulted for Cefepime dosing.  Plan: Renal function improving CrCl 63.5 ml/min , Cefepime dose increased to 2 gm q8h.   Height: 6' 0.01" (182.9 cm) Weight: 236 lb 15.9 oz (107.5 kg) IBW/kg (Calculated) : 77.62  Temp (24hrs), Avg:99.3 F (37.4 C), Min:98.4 F (36.9 C), Max:100.4 F (38 C)  Recent Labs  Lab 10/19/18 0935 10/19/18 1357  10/19/18 2306 10/20/18 0438 10/21/18 0402 10/22/18 0532 10/23/18 0246  WBC  --  15.5*  --   --  17.7* 12.5* 10.9* 9.2  CREATININE  --  8.30*   < > 7.59* 6.98* 4.90* 2.62* 1.49*  LATICACIDVEN 1.0  --   --   --   --   --   --   --    < > = values in this interval not displayed.    Estimated Creatinine Clearance: 63.5 mL/min (A) (by C-G formula based on SCr of 1.49 mg/dL (H)).    Allergies  Allergen Reactions  . Metformin Diarrhea    Antimicrobials this admission: 5/23 Vancomycin x 1 5/23 Zosyn x 1 5/23 Cefepime >>  Dose adjustments this admission: 5/25 Cefepime transitioned to 2 gm q24h 5/27 Cefepime increased to 2g q8h  Microbiology results: 5/23 BCx: pending    5/23 MRSA PCR: pending 5/23 COVID-19 (-)  Thank you for allowing pharmacy to be a part of this patient's care.  Paulina Fusi, PharmD, BCPS 10/23/2018 5:30 PM

## 2018-10-24 ENCOUNTER — Other Ambulatory Visit: Payer: Self-pay

## 2018-10-24 ENCOUNTER — Inpatient Hospital Stay: Payer: Medicare HMO

## 2018-10-24 LAB — GLUCOSE, CAPILLARY
Glucose-Capillary: 138 mg/dL — ABNORMAL HIGH (ref 70–99)
Glucose-Capillary: 129 mg/dL — ABNORMAL HIGH (ref 70–99)
Glucose-Capillary: 132 mg/dL — ABNORMAL HIGH (ref 70–99)
Glucose-Capillary: 135 mg/dL — ABNORMAL HIGH (ref 70–99)
Glucose-Capillary: 130 mg/dL — ABNORMAL HIGH (ref 70–99)
Glucose-Capillary: 137 mg/dL — ABNORMAL HIGH (ref 70–99)

## 2018-10-24 LAB — CBC WITH DIFFERENTIAL/PLATELET
Abs Immature Granulocytes: 0.28 10*3/uL — ABNORMAL HIGH (ref 0.00–0.07)
Basophils Absolute: 0 10*3/uL (ref 0.0–0.1)
Basophils Relative: 0 %
Eosinophils Absolute: 0.4 10*3/uL (ref 0.0–0.5)
Eosinophils Relative: 4 %
HCT: 26.7 % — ABNORMAL LOW (ref 39.0–52.0)
Hemoglobin: 8.2 g/dL — ABNORMAL LOW (ref 13.0–17.0)
Immature Granulocytes: 3 %
Lymphocytes Relative: 16 %
Lymphs Abs: 1.6 10*3/uL (ref 0.7–4.0)
MCH: 26.7 pg (ref 26.0–34.0)
MCHC: 30.7 g/dL (ref 30.0–36.0)
MCV: 87 fL (ref 80.0–100.0)
Monocytes Absolute: 0.7 10*3/uL (ref 0.1–1.0)
Monocytes Relative: 7 %
Neutro Abs: 7 10*3/uL (ref 1.7–7.7)
Neutrophils Relative %: 70 %
Platelets: 282 10*3/uL (ref 150–400)
RBC: 3.07 MIL/uL — ABNORMAL LOW (ref 4.22–5.81)
RDW: 15.2 % (ref 11.5–15.5)
WBC: 10.1 10*3/uL (ref 4.0–10.5)
nRBC: 0 % (ref 0.0–0.2)

## 2018-10-24 LAB — BASIC METABOLIC PANEL
Anion gap: 7 (ref 5–15)
BUN: 33 mg/dL — ABNORMAL HIGH (ref 8–23)
CO2: 24 mmol/L (ref 22–32)
Calcium: 6.6 mg/dL — ABNORMAL LOW (ref 8.9–10.3)
Chloride: 114 mmol/L — ABNORMAL HIGH (ref 98–111)
Creatinine, Ser: 1.02 mg/dL (ref 0.61–1.24)
GFR calc Af Amer: >60 mL/min (ref >60)
GFR calc non Af Amer: >60 mL/min (ref >60)
Glucose, Bld: 153 mg/dL — ABNORMAL HIGH (ref 70–99)
Potassium: 3 mmol/L — ABNORMAL LOW (ref 3.5–5.1)
Sodium: 145 mmol/L (ref 135–145)

## 2018-10-24 LAB — CULTURE, BLOOD (ROUTINE X 2)
Culture: NO GROWTH
Culture: NO GROWTH
Special Requests: ADEQUATE

## 2018-10-24 MED ORDER — POTASSIUM CHLORIDE 10 MEQ/100ML IV SOLN
10.0000 meq | INTRAVENOUS | Status: AC
  Administered 2018-10-24 (×4): 10 meq via INTRAVENOUS
  Filled 2018-10-24 (×4): qty 100

## 2018-10-24 MED ORDER — GLYCOPYRROLATE 0.2 MG/ML IJ SOLN
0.1000 mg | Freq: Two times a day (BID) | INTRAMUSCULAR | Status: DC
  Administered 2018-10-24 – 2018-10-25 (×2): 0.1 mg via INTRAVENOUS
  Filled 2018-10-24 (×3): qty 1

## 2018-10-24 MED ORDER — JUVEN PO PACK
1.0000 | PACK | Freq: Two times a day (BID) | ORAL | Status: DC
  Administered 2018-10-24 – 2018-10-25 (×2): 1

## 2018-10-24 MED ORDER — MAGNESIUM SULFATE 4 GM/100ML IV SOLN
4.0000 g | Freq: Once | INTRAVENOUS | Status: AC
  Administered 2018-10-24: 06:00:00 4 g via INTRAVENOUS
  Filled 2018-10-24: qty 100

## 2018-10-24 MED ORDER — FAMOTIDINE IN NACL 20-0.9 MG/50ML-% IV SOLN
20.0000 mg | Freq: Two times a day (BID) | INTRAVENOUS | Status: DC
  Administered 2018-10-24 – 2018-11-01 (×19): 20 mg via INTRAVENOUS
  Filled 2018-10-24 (×20): qty 50

## 2018-10-24 NOTE — Progress Notes (Signed)
[place] [place] NAME: Lennard Capek    MR#:  [id number]  DATE OF BIRTH:  May 05, 1956  SUBJECTIVE:  Patient critically ill intubated on the ventilator   Urine output 3.4 L in 24 hours patient now off Neosynephrine gtt  On PSV since this am REVIEW OF SYSTEMS:   Review of Systems  Unable to perform ROS: Intubated   DRUG ALLERGIES:   Allergies  Allergen Reactions  . Metformin Diarrhea    VITALS:  Blood pressure (!) 142/75, pulse 82, temperature 99.7 F (37.6 C), resp. rate 18, height 6' 0.01" (1.829 m), weight 103.8 kg, SpO2 93 %.  PHYSICAL EXAMINATION:   Physical Exam  GENERAL:  63 y.o.-year-old patient lying in the bed with no acute distress. Critically ill EYES: Pupils equal, round, reactive to light and accommodation. No scleral icterus. Extraocular muscles intact.  HEENT: Head atraumatic, normocephalic. Oropharynx and nasopharynx clear.  NECK:  Supple, no jugular venous distention. No thyroid enlargement, no tenderness.  LUNGS: decreased breath sounds bilaterally, no wheezing, rales, rhonchi. No use of accessory muscles of respiration.  CARDIOVASCULAR: S1, S2 normal. No murmurs, rubs, or gallops.  ABDOMEN: Soft, nontender, nondistended. Bowel sounds present. No organomegaly or mass.  EXTREMITIES: No cyanosis, clubbing o ++ edema b/l.    NEUROLOGIC: intubated  PSYCHIATRIC:intubated SKIN: No obvious rash, lesion, or ulcer--per RN   LABORATORY PANEL:  CBC Recent Labs  Lab 10/24/18 0242  WBC 10.1  HGB 8.2*  HCT 26.7*  PLT 282    Chemistries  Recent Labs  Lab 10/21/18 0402  10/23/18 0246  10/24/18 0242  NA 150*   < > 148*  --  145  K 3.3*   < > 2.9*   < > 3.0*  CL 114*   < > 114*  --  114*  CO2 22   < > 24  --  24  GLUCOSE 185*   < > 165*  --  153*  BUN 60*   < > 28*  --  33*  CREATININE 4.90*   < > 1.49*  --  1.02  CALCIUM 7.8*   < > 7.1*  --  6.6*  MG  --    < > 1.4*  --   --   AST 9*  --   --    --   --   ALT 11  --   --   --   --   ALKPHOS 43  --   --   --   --   BILITOT 0.8  --   --   --   --    < > = values in this interval not displayed.   Cardiac Enzymes Recent Labs  Lab 10/19/18 0900  TROPONINI <0.03   RADIOLOGY:  Dg Abd Portable 1v  Result Date: 10/24/2018 CLINICAL DATA:  NG tube placement EXAM: PORTABLE ABDOMEN - 1 VIEW COMPARISON:  None. FINDINGS: NG tube extends into the stomach with side port chest below the GE junction. No dilated loops of large or small bowel. Gas and stool rectum. IMPRESSION: NG tube with tip in stomach.  No bowel obstruction. Electronically Signed   By: Suzy Bouchard M.D.   On: 10/24/2018 12:18   ASSESSMENT AND PLAN:  Zev Blue  is a 63 y.o. male with a known history of cerebral hemorrhage, acute hypoxic respiratory failure was admitted at [facility] this year underwent head tracheostomy, history of CVA, hypertension, depression and history of drug abuse  comes to the emergency room after he was found with low blood sugar of 34 at the facility. Patient received D10 why EMS came to the emergency room remains unresponsive despite sternal rub got intubated and placed on the ventilator for every protection.  1. Sepsis/septic shock due to urinary tract infection however UC negative -broad-spectrum antibiotic with cefepime - blood culture negative -off IV neo-synephrine -cont IV solucortef -trying to wean pt off ventilator  2. Acute renal failure suspected due to urinary retention withhistory of prostate enlargement (on-Cardura) -baseline creatinine 0.53 in March 2020 -nephrology consultation with Dr. Zollie Scale spoke with him -avoid nephrotoxins - renal ultrasound-showed bilateral hydronephrosis -creatinine 8.53--- 7.59-- 6.98--4.9--2.62--1.49--1.02  3. Hyperkalemia/ hypernatremia -IV dextrose with insulin, calcium gluconate, bicarb drip -resolved K 4.7 -sodium trending down 150--148  4. Acute encephalopathy due to hypoglycemia and intubated for  airway protection -CT  Head Left frontal craniotomy. Encephalomalacia in the anterior frontal 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  5. History of cerebral hemorrhage s/p clipped aneurysm. - Patient on Keppra for seizure precautions will change to IV Keppra 750 BID  6. DVT prophylaxis subcu heparin   CODE STATUS: full  DVT Prophylaxis:heparin  TOTAL critical TIME TAKING CARE OF THIS PATIENT: *25* minutes.  >50% time spent on counselling and coordination of care  POSSIBLE D/C IN  ?DAYS, DEPENDING ON CLINICAL CONDITION.  Note: This dictation was prepared with Dragon dictation along with smaller phrase technology. Any transcriptional errors that result from this process are unintentional.  Fritzi Mandes M.D on 10/24/2018 at 3:05 PM  Between 7am to 6pm - Pager - [phone]  After 6pm go to [email or web address] - password [email or web address] [facility]  Office  [phone]  CC: Primary care physician; Garwin Brothers, MDPatient ID: [name], male   DOB: 09-15-1955, 63 y.o.   MRN: [id number]

## 2018-10-24 NOTE — Progress Notes (Signed)
CRITICAL CARE NOTE        SUBJECTIVE   Patient remains critically ill Prognosis is guarded Patient on spontaneous breathing trial with acceptable parameters thus far, will attempt to extubate today  PAST MEDICAL HISTORY   Past Medical History:  Diagnosis Date  . Allergy   . Anxiety   . Cataract   . Cerebral hemorrhage ([place])   . Chest pain, atypical    12/2003:  negative cardiolyte  . Depression   . Drug abuse ([place])   . Eczema   . Erectile dysfunction   . History of meniscal tear    bilateral  . HLD (hyperlipidemia)   . HTN (hypertension)   . Hx of tear of ACL (anterior cruciate ligament)    right  . Insomnia   . Olecranon bursitis of left elbow 10/2009   s/p I&D by Dr Maxie Better, initially assessed by Dr. Nori Riis   . Prediabetes   . Stroke ([place])   . Suicidal behavior 04/13/2017   Pt states taking a bunch of sleeping pills to end life   . TIA (transient ischemic attack)      SURGICAL HISTORY   Past Surgical History:  Procedure Laterality Date  . EP IMPLANTABLE DEVICE N/A 12/30/2015   Procedure: Loop Recorder Insertion;  Surgeon: Thompson Grayer, MD;  Location: [facility];  Service: Cardiovascular;  Laterality: N/A;  . ESOPHAGOGASTRODUODENOSCOPY N/A 10/03/2016   Procedure: ESOPHAGOGASTRODUODENOSCOPY (EGD);  Surgeon: Irene Shipper, MD;  Location: [facility] ENDOSCOPY;  Service: Endoscopy;  Laterality: N/A;  . INCISE AND DRAIN ABCESS     L elbow due to cellulitis/bursitis  . INNER EAR SURGERY    . IR GENERIC HISTORICAL  12/28/2015   IR ANGIO VERTEBRAL SEL VERTEBRAL UNI L MOD SED 12/28/2015 Luanne Bras, MD [facility]  . IR GENERIC HISTORICAL  12/28/2015   IR ANGIO VERTEBRAL SEL SUBCLAVIAN INNOMINATE UNI R MOD SED 12/28/2015 Luanne Bras, MD [facility]  . IR GENERIC HISTORICAL  12/28/2015   IR ANGIO  INTRA EXTRACRAN SEL INTERNAL CAROTID BILAT MOD SED 12/28/2015 Luanne Bras, MD [facility]  . KNEE ARTHROSCOPY    . RADIOLOGY WITH ANESTHESIA N/A 09/20/2015   Procedure: EMBOLIZATION         (RADIOLOGY WITH ANESTHESIA);  Surgeon: Luanne Bras, MD;  Location: [facility];  Service: Radiology;  Laterality: N/A;  . SKIN TAG REMOVAL     11 removed  . TEE WITHOUT CARDIOVERSION N/A 12/30/2015   Procedure: TRANSESOPHAGEAL ECHOCARDIOGRAM (TEE);  Surgeon: Larey Dresser, MD;  Location: [facility] ENDOSCOPY;  Service: Cardiovascular;  Laterality: N/A;     FAMILY HISTORY   Family History  Problem Relation Age of Onset  . Stroke Mother   . Hypertension Mother   . Aneurysm Mother 88       Died of brain aneursym  . Heart failure Father   . Emphysema Father   . Diabetes Mellitus II Sister   . Colon cancer Neg Hx   . Rectal cancer Neg Hx   . Stomach cancer Neg Hx      SOCIAL HISTORY   Social History   Tobacco Use  . Smoking status: Never Smoker  . Smokeless tobacco: Never Used  Substance Use Topics  . Alcohol use: No    Alcohol/week: 0.0 standard drinks  . Drug use: No     MEDICATIONS   Current Medication:  Current Facility-Administered Medications:  .  ceFEPIme (MAXIPIME) 2 g in sodium chloride 0.9 % 100 mL IVPB, 2 g, Intravenous,  Selena Lesser, MD, Stopped at 10/24/18 [phone] .  chlorhexidine gluconate (MEDLINE KIT) (PERIDEX) 0.12 % solution 15 mL, 15 mL, Mouth Rinse, BID, Tukov-Yual, Magdalene S, NP, 15 mL at 10/23/18 1952 .  Chlorhexidine Gluconate Cloth 2 % PADS 6 each, 6 each, Topical, Q0600, Tyler Pita, MD, 6 each at 10/23/18 2300 .  dextrose 5 % 1,000 mL with potassium chloride 40 mEq infusion, , Intravenous, Continuous, Kolluru, Sarath, MD, Last Rate: 100 mL/hr at 10/24/18 0640 .  famotidine (PEPCID) IVPB 20 mg premix, 20 mg, Intravenous, BID, Genevive Bi, Shona Needles, MD, Stopped at 10/24/18 0258 .  feeding supplement (PRO-STAT SUGAR FREE 64) liquid 60 mL, 60 mL, Per Tube,  QID, Tyler Pita, MD, 60 mL at 10/23/18 2128 .  feeding supplement (VITAL HIGH PROTEIN) liquid 1,000 mL, 1,000 mL, Per Tube, Q24H, Tyler Pita, MD, 1,000 mL at 10/24/18 0500 .  free water 200 mL, 200 mL, Per Tube, Q4H, Vernard Gambles L, MD, 200 mL at 10/24/18 0501 .  heparin injection 5,000 Units, 5,000 Units, Subcutaneous, Q8H, Fritzi Mandes, MD, 5,000 Units at 10/24/18 0512 .  hydrocortisone sodium succinate (SOLU-CORTEF) 100 MG injection 50 mg, 50 mg, Intravenous, Q8H, Tyler Pita, MD, 50 mg at 10/24/18 0105 .  levETIRAcetam (KEPPRA) IVPB 500 mg/100 mL premix, 500 mg, Intravenous, Q12H, Tyler Pita, MD, Stopped at 10/23/18 2140 .  magnesium sulfate IVPB 4 g 100 mL, 4 g, Intravenous, Once, Fritzi Mandes, MD, Last Rate: 50 mL/hr at 10/24/18 0640 .  MEDLINE mouth rinse, 15 mL, Mouth Rinse, 10 times per day, Tukov-Yual, Magdalene S, NP, 15 mL at 10/24/18 0514 .  multivitamin liquid 15 mL, 15 mL, Per Tube, Daily, Tyler Pita, MD, 15 mL at 10/23/18 0914 .  mupirocin ointment (BACTROBAN) 2 % 1 application, 1 application, Nasal, BID, Ottie Glazier, MD, 1 application at 73/42/87 2125 .  phenylephrine (NEO-SYNEPHRINE) 10 mg in sodium chloride 0.9 % 250 mL (0.04 mg/mL) infusion, 0-400 mcg/min, Intravenous, Titrated, Fritzi Mandes, MD, Stopped at 10/22/18 [phone] .  polyethylene glycol (MIRALAX / GLYCOLAX) packet 17 g, 17 g, Oral, Daily PRN, Fritzi Mandes, MD .  potassium chloride 10 mEq in 100 mL IVPB, 10 mEq, Intravenous, Q1 Hr x 4, Patel, Sona, MD .  propofol (DIPRIVAN) 1000 MG/100ML infusion, 5-80 mcg/kg/min, Intravenous, Continuous, Earleen Newport, MD, Last Rate: 3.62 mL/hr at 10/24/18 0640, 5 mcg/kg/min at 10/24/18 0640    ALLERGIES   Metformin    REVIEW OF SYSTEMS   Unable to obtain ROS due to mechanical ventilation and sedation  PHYSICAL EXAMINATION   Vitals:   10/24/18 0500 10/24/18 0600  BP: 103/62 116/71  Pulse: 60 72  Resp: 18 18  Temp: 99 F  (37.2 C) 98.8 F (37.1 C)  SpO2: 99% 98%    GENERAL: [place] ill-appearing status post CVA HEAD: Normocephalic, atraumatic.  EYES: Pupils equal, round, reactive to light.  No scleral icterus.  MOUTH: Moist mucosal membrane. NECK: Supple. No thyromegaly. No nodules. No JVD.  PULMONARY: Mild bibasilar crepitations CARDIOVASCULAR: S1 and S2. Regular rate and rhythm. No murmurs, rubs, or gallops.  GASTROINTESTINAL: Soft, nontender, non-distended. No masses. Positive bowel sounds. No hepatosplenomegaly.  MUSCULOSKELETAL: No swelling, clubbing, or edema.  NEUROLOGIC: Mild distress due to acute illness SKIN:intact,warm,dry   LABS AND IMAGING     LAB RESULTS: Recent Labs  Lab 10/22/18 0532 10/23/18 0246 10/23/18 1002 10/24/18 0242  NA 155* 148*  --  145  K 3.3* 2.9* 3.1* 3.0*  CL  120* 114*  --  114*  CO2 25 24  --  24  BUN 48* 28*  --  33*  CREATININE 2.62* 1.49*  --  1.02  GLUCOSE 177* 165*  --  153*   Recent Labs  Lab 10/22/18 0532 10/23/18 0246 10/24/18 0242  HGB 8.8* 8.1* 8.2*  HCT 29.4* 26.6* 26.7*  WBC 10.9* 9.2 10.1  PLT 291 289 282     IMAGING RESULTS: No results found.    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 History of proteinuria secondary to diabetic nephropathy.  -secondary to urinary obstruction/retention.      Hypernatremia:- RESOLVED today free water deficit of 6 liters - Increase D5W infusion to  158m/hr - free water if resuming tube feeds. -continue Full MV support -continue Bronchodilator Therapy -Wean Fio2 and PEEP as tolerated -will perform SAT/SBT when respiratory parameters are met    Septic shock  - of urinary source as above  -urine culture pending - although may be innaccurate due to empiric antibiotic tx prior to collections -off vasopressor support now with improved hemodynamics  NEUROLOGY - intubated and sedated - minimal sedation to achieve a RASS goal: -1 Wake up assessment pending   ID -continue IV abx as prescibed -follow up cultures  GI/Nutrition GI PROPHYLAXIS as indicated DIET-->TF's as tolerated Constipation protocol as indicated  ENDO - ICU hypoglycemic\Hyperglycemia protocol -check FSBS per protocol   ELECTROLYTES -follow labs as needed -replace as needed -pharmacy consultation   DVT/GI PRX ordered -SCDs  TRANSFUSIONS AS NEEDED MONITOR FSBS ASSESS the need for LABS as needed   Critical care provider statement:  Critical care time (minutes):31 Critical care time was exclusive of: Separately billable procedures and treating other patients Critical care was necessary to treat or prevent imminent or life-threatening deterioration of the following conditions:Septic shock of urinary source, acute toxic metabolic encephalopathy, acute kidney injury stage IV, history of hemorrhagic CVA, multiple comorbid conditions Critical care was time spent personally by me on the following activities: Development of treatment plan with patient or surrogate, discussions with consultants, evaluation of patient's response to treatment, examination of patient, obtaining history from patient or surrogate, ordering and performing treatments and interventions, ordering and review of laboratory studies and re-evaluation of patient's condition. I assumed direction of critical care for this patient from another provider in my  specialty: no   This document was prepared using Dragon voice recognition software and may include unintentional dictation errors.    FOttie Glazier M.D.  Division of [facility]

## 2018-10-24 NOTE — Progress Notes (Signed)
Nutrition Follow-up  RD working remotely.  DOCUMENTATION CODES:   Obesity unspecified  INTERVENTION:  Recommend obtaining x-ray to confirm OGT still terminates in stomach. Tube is -7 cm from 5/25 with no subsequent x-ray.  Once placement confirmed, continue Vital High Protein at 20 mL/hr (480 mL goal daily volume) + Pro-Stat 60 mL QID per tube. Provides 1280 kcal, 162 grams of protein, 403 mL H2O daily.  With free water flush of 200 mL Q4hrs patient will receive a total of 1603 mL H2O daily including water in tube feeding.  Continue liquid MVI daily per tube.  Also provide Juven pack BID per tube to promote wound healing.  NUTRITION DIAGNOSIS:   Inadequate oral intake related to inability to eat as evidenced by NPO status.  Ongoing - addressing with TF regimen.  GOAL:   Provide needs based on ASPEN/SCCM guidelines  Met with TF regimen.  MONITOR:   Vent status, Labs, Weight trends, TF tolerance, I & O's  REASON FOR ASSESSMENT:   Ventilator    ASSESSMENT:   63 year old male with PMHx of HTN, HLD, depression, anxiety, hx CVA, hx drug abuse admitted with acute mental status change in setting of prior cerebral hemorrhage and CVA and requiring intubation on 5/23 for airway protection, also with urinary retention with acute renal failure.  Patient remains intubated and sedated. On SIMV with FiO2 35%, Pressure Support 8 cmH2O, and PEEP now 5 cmH2O.Marland Kitchen Abdomen soft per RN documentation. Last BM was a small type 5 on 5/26. New stage II pressure injury documented since last assessment.  Enteral Access: 18 Fr. OGT placed 5/23; terminates in stomach per chest x-ray 5/23; 55 cm at corner of mouth (this is -7 cm from 5/25 and there is no x-ray since to confirm placement); discussed with RN  TF: pt tolerating Vital High Protein at goal rate of 20 mL/hr + Pro-Stat 60 mL QID + free water flush 200 mL Q4hrs  Patient is currently intubated on ventilator support Ve: 8.2 L/min Temp  (24hrs), Avg:99.6 F (37.6 C), Min:98.8 F (37.1 C), Max:100.4 F (38 C)  Propofol: 3.62 mL/hr (96 kcal daily)  Medications reviewed and include: Solu-Cortef 50 mg Q8hrs IV, liquid MVI daily per tube, cefepime, D5W with KCl 40 mE at 50 mL/hr (60 grams dextrose, 204 kcal daily), famotidine, potassium chloride 10 mEq IV x 4 today, propofol gtt.  Labs reviewed: CBG 138-162, Potassium 3, Chloride 114, BUN 33.  I/O: 4700 mL UOP yesterday (1.9 mL/kg/hr)  Weight trend: 103.8 kg on 5/28; wt trending down; may be related to increased UOP will continue to monitor  Diet Order:   Diet Order            Diet NPO time specified  Diet effective now             EDUCATION NEEDS:   No education needs have been identified at this time  Skin:  Skin Assessment: Skin Integrity Issues:(stg II coccyx)  Last BM:  10/22/2018 - small type 5  Height:   Ht Readings from Last 1 Encounters:  10/23/18 6' 0.01" (1.829 m)   Weight:   Wt Readings from Last 1 Encounters:  10/24/18 103.8 kg   Ideal Body Weight:  80.9 kg  BMI:  Body mass index is 31.03 kg/m.  Estimated Nutritional Needs:   Kcal:  1211-1541 (11-14 kcal/kg)  Protein:  162 grams (2 grams/kg IBW)  Fluid:  2 L/day (25 mL/kg IBW)  Willey Blade, MS, RD, LDN Office: [phone] Pager:  [phone] After Hours/Weekend Pager: [phone]

## 2018-10-24 NOTE — Progress Notes (Signed)
[facility]  ROUNDING NOTE   Subjective:   Afebrile  UOP 4700 (3600)  Na 145 (148) (155) (150)  D5W with 20 KCl at 135m/hr   Objective:  Vital signs in last 24 hours:  Temp:  [98.8 F (37.1 C)-100.4 F (38 C)] 98.8 F (37.1 C) (05/28 0600) Pulse Rate:  [60-91] 72 (05/28 0600) Resp:  [14-24] 18 (05/28 0600) BP: (99-161)/(62-91) 116/71 (05/28 0600) SpO2:  [96 %-100 %] 98 % (05/28 0600) FiO2 (%):  [35 %] 35 % (05/28 0745) Weight:  [103.8 kg] 103.8 kg (05/28 0216)  Weight change: -3.7 kg Filed Weights   10/22/18 0445 10/23/18 0500 10/24/18 0216  Weight: 112.2 kg 107.5 kg 103.8 kg    Intake/Output: I/O last 3 completed shifts: In: 6559.8 [I.V.:4531; NG/GT:680; IV Piggyback:1348.8] Out: 7850 [Urine:7850]   Intake/Output this shift:  No intake/output data recorded.  Physical Exam: General: Critically ill appearing  Head: ETT OGT  Eyes: Anicteric  Neck: trachea midline  Lungs:  PRVC FiO2 35%  Heart: regular  Abdomen:  Soft   Extremities: 1+ dependent  edema.  Neurologic: Intubated and sedated  Skin: No lesions  GU Foley     Basic Metabolic Panel: Recent Labs  Lab 10/19/18 2025 10/19/18 2306 10/20/18 0438 10/21/18 0402 10/22/18 0532 10/23/18 0246 10/23/18 1002 10/24/18 0242  NA 148* 148* 151* 150* 155* 148*  --  145  K 5.2* 4.9 4.7 3.3* 3.3* 2.9* 3.1* 3.0*  CL 116* 115* 114* 114* 120* 114*  --  114*  CO2 21* 21* '25 22 25 24  '$ --  24  GLUCOSE 99 123* 190* 185* 177* 165*  --  153*  BUN 81* 80* 77* 60* 48* 28*  --  33*  CREATININE 7.78* 7.59* 6.98* 4.90* 2.62* 1.49*  --  1.02  CALCIUM 8.0* 7.8* 8.1* 7.8* 7.9* 7.1*  --  6.6*  MG  --  2.3 2.2  --  1.8 1.4*  --   --   PHOS 6.2* 5.8* 5.9*  --  3.7  --   --   --     Liver Function Tests: Recent Labs  Lab 10/19/18 0900 10/19/18 2025 10/20/18 0438 10/21/18 0402  AST 8*  --  9* 9*  ALT 12  --  11 11  ALKPHOS 55  --  50 43  BILITOT 0.7  --  0.6 0.8  PROT 6.2*  --  5.9* 5.9*  ALBUMIN  2.5* 2.5* 2.8* 2.4*   No results for input(s): LIPASE, AMYLASE in the last 168 hours. No results for input(s): AMMONIA in the last 168 hours.  CBC: Recent Labs  Lab 10/19/18 0900  10/20/18 0438 10/21/18 0402 10/22/18 0532 10/23/18 0246 10/24/18 0242  WBC 20.7*   < > 17.7* 12.5* 10.9* 9.2 10.1  NEUTROABS 17.8*  --   --   --  8.5*  --  7.0  HGB 11.4*   < > 9.2* 8.4* 8.8* 8.1* 8.2*  HCT 36.3*   < > 30.1* 27.8* 29.4* 26.6* 26.7*  MCV 86.4   < > 88.3 88.5 90.2 89.0 87.0  PLT 310   < > 261 270 291 289 282   < > = values in this interval not displayed.    Cardiac Enzymes: Recent Labs  Lab 10/19/18 0900  TROPONINI <0.03    BNP: Invalid input(s): POCBNP  CBG: Recent Labs  Lab 10/23/18 1112 10/23/18 1623 10/23/18 1959 10/23/18 2302 10/24/18 0351  GLUCAP 134* 156* 159* 162* 138*    Microbiology:  Results for orders placed or performed during the hospital encounter of 10/19/18  SARS Coronavirus 2 (CEPHEID - Performed in [place] hospital lab), Hosp Order     Status: None   Collection Time: 10/19/18  9:28 AM  Result Value Ref Range Status   SARS Coronavirus 2 NEGATIVE NEGATIVE Final    Comment: (NOTE) If result is NEGATIVE SARS-CoV-2 target nucleic acids are NOT DETECTED. The SARS-CoV-2 RNA is generally detectable in upper and lower  respiratory specimens during the acute phase of infection. The lowest  concentration of SARS-CoV-2 viral copies this assay can detect is 250  copies / mL. A negative result does not preclude SARS-CoV-2 infection  and should not be used as the sole basis for treatment or other  patient management decisions.  A negative result may occur with  improper specimen collection / handling, submission of specimen other  than nasopharyngeal swab, presence of viral mutation(s) within the  areas targeted by this assay, and inadequate number of viral copies  (<250 copies / mL). A negative result must be combined with clinical  observations, patient  history, and epidemiological information. If result is POSITIVE SARS-CoV-2 target nucleic acids are DETECTED. The SARS-CoV-2 RNA is generally detectable in upper and lower  respiratory specimens dur ing the acute phase of infection.  Positive  results are indicative of active infection with SARS-CoV-2.  Clinical  correlation with patient history and other diagnostic information is  necessary to determine patient infection status.  Positive results do  not rule out bacterial infection or co-infection with other viruses. If result is PRESUMPTIVE POSTIVE SARS-CoV-2 nucleic acids MAY BE PRESENT.   A presumptive positive result was obtained on the submitted specimen  and confirmed on repeat testing.  While 2019 novel coronavirus  (SARS-CoV-2) nucleic acids may be present in the submitted sample  additional confirmatory testing may be necessary for epidemiological  and / or clinical management purposes  to differentiate between  SARS-CoV-2 and other Sarbecovirus currently known to infect humans.  If clinically indicated additional testing with an alternate test  methodology [phone]) is advised. The SARS-CoV-2 RNA is generally  detectable in upper and lower respiratory sp ecimens during the acute  phase of infection. The expected result is Negative. Fact Sheet for Patients:  StrictlyIdeas.no Fact Sheet for Healthcare Providers: BankingDealers.co.za This test is not yet approved or cleared by the [place] FDA and has been authorized for detection and/or diagnosis of SARS-CoV-2 by FDA under an Emergency Use Authorization (EUA).  This EUA will remain in effect (meaning this test can be used) for the duration of the COVID-19 declaration under Section 564(b)(1) of the Act, 21 U.S.C. section 360bbb-3(b)(1), unless the authorization is terminated or revoked sooner. Performed at [facility], [address] [place], [place]  [address]   Blood culture (routine x 2)     Status: None   Collection Time: 10/19/18  9:36 AM  Result Value Ref Range Status   Specimen Description BLOOD LUA  Final   Special Requests   Final    BOTTLES DRAWN AEROBIC AND ANAEROBIC Blood Culture adequate volume   Culture   Final    NO GROWTH 5 DAYS Performed at [facility], [address] [place], [place] [address]    Report Status 10/24/2018 FINAL  Final  Blood culture (routine x 2)     Status: None   Collection Time: 10/19/18 10:16 AM  Result Value Ref Range Status   Specimen Description BLOOD LFA  Final  Special Requests   Final    BOTTLES DRAWN AEROBIC AND ANAEROBIC Blood Culture results may not be optimal due to an inadequate volume of blood received in culture bottles   Culture   Final    NO GROWTH 5 DAYS Performed at [facility], [address] [place], [place] [address]    Report Status 10/24/2018 FINAL  Final  MRSA PCR Screening     Status: Abnormal   Collection Time: 10/19/18  1:06 PM  Result Value Ref Range Status   MRSA by PCR POSITIVE (A) NEGATIVE Final    Comment:        The GeneXpert MRSA Assay (FDA approved for NASAL specimens only), is one component of a comprehensive MRSA colonization surveillance program. It is not intended to diagnose MRSA infection nor to guide or monitor treatment for MRSA infections. CRITICAL RESULT CALLED TO, READ BACK BY AND VERIFIED WITH: CALLED TO TANYA SILVA '@[id number]'$  10/19/2018 [facility] Performed at [facility], [address] [place], [place] [address]   Urine Culture     Status: None   Collection Time: 10/21/18  4:10 PM  Result Value Ref Range Status   Specimen Description   Final    URINE, CATHETERIZED Performed at [facility], [address] [place], [place] [address]    Special Requests   Final    Normal Performed at [facility], [address], [place], [place] [address]    Culture   Final    NO GROWTH Performed at  [facility], [place] [address] [place], [place] [address]    Report Status 10/22/2018 FINAL  Final    Coagulation Studies: No results for input(s): LABPROT, INR in the last 72 hours.  Urinalysis: No results for input(s): COLORURINE, LABSPEC, PHURINE, GLUCOSEU, HGBUR, BILIRUBINUR, KETONESUR, PROTEINUR, UROBILINOGEN, NITRITE, LEUKOCYTESUR in the last 72 hours.  Invalid input(s): APPERANCEUR    Imaging: No results found.   Medications:   . ceFEPime (MAXIPIME) IV Stopped (10/24/18 0542)  . dextrose 5 % with kcl 100 mL/hr at 10/24/18 0640  . famotidine (PEPCID) IV Stopped (10/24/18 0258)  . levETIRAcetam Stopped (10/23/18 2140)  . phenylephrine (NEO-SYNEPHRINE) Adult infusion Stopped (10/22/18 0737)  . potassium chloride    . propofol (DIPRIVAN) infusion 5 mcg/kg/min (10/24/18 0640)   . chlorhexidine gluconate (MEDLINE KIT)  15 mL Mouth Rinse BID  . Chlorhexidine Gluconate Cloth  6 each Topical Q0600  . feeding supplement (PRO-STAT SUGAR FREE 64)  60 mL Per Tube QID  . feeding supplement (VITAL HIGH PROTEIN)  1,000 mL Per Tube Q24H  . free water  200 mL Per Tube Q4H  . heparin  5,000 Units Subcutaneous Q8H  . hydrocortisone sod succinate (SOLU-CORTEF) inj  50 mg Intravenous Q8H  . mouth rinse  15 mL Mouth Rinse 10 times per day  . multivitamin  15 mL Per Tube Daily  . mupirocin ointment  1 application Nasal BID   polyethylene glycol  Assessment/ Plan:  [name] is a 63 y.o. white male with cerebral hemorrhage, depression, erectile dysfunction, hyperlipidemia, hypertension, CVA, who was admitted to [facility] on 10/19/2018 for evaluation of altered mental status and hypoglycemia while at a nursing home.   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  1.  Acute renal failure: baseline creatinine of 0.53 on 08/26/18. History of proteinuria secondary to diabetic nephropathy.  Acute renal  failure secondary to urinary obstruction/retention.  Nonoliguric.  Creatinine is improving.   2. Hypernatremia: free water deficit has improved - Continue D5W infusion - decrease rate to 65m/hr - free water with tube feeds  3. Hypokalemia - IV potassium replacement and magnesium replacemnt  Will sign off. Please call with questions.    LOS: 5 Tatiyanna Lashley 5/28/20208:41 AM

## 2018-10-24 NOTE — Progress Notes (Signed)
Pharmacy Antibiotic Note  [name] is a 63 y.o. male admitted on 10/19/2018 with UTI.  PMH significant for CVA, HTN, tracheostomy. Patient presented with low blood sugar and was unresponsive. Baseline Scr from [facility] in 07/2018 was ~0.55.  Pharmacy has been consulted for Cefepime dosing.  Plan: Continue Cefepime dose increased to 2 gm q8h.   Height: 6' 0.01" (182.9 cm) Weight: 228 lb 13.4 oz (103.8 kg) IBW/kg (Calculated) : 77.62  Temp (24hrs), Avg:99.5 F (37.5 C), Min:98.8 F (37.1 C), Max:100.4 F (38 C)  Recent Labs  Lab 10/19/18 0935  10/20/18 0438 10/21/18 0402 10/22/18 0532 10/23/18 0246 10/24/18 0242  WBC  --    < > 17.7* 12.5* 10.9* 9.2 10.1  CREATININE  --    < > 6.98* 4.90* 2.62* 1.49* 1.02  LATICACIDVEN 1.0  --   --   --   --   --   --    < > = values in this interval not displayed.    Estimated Creatinine Clearance: 91.2 mL/min (by C-G formula based on SCr of 1.02 mg/dL).    Allergies  Allergen Reactions  . Metformin Diarrhea    Antimicrobials this admission: 5/23 Vancomycin x 1 5/23 Zosyn x 1 5/23 Cefepime >>  Dose adjustments this admission: 5/25 Cefepime transitioned to 2 gm q24h 5/27 Cefepime increased to 2g q8h  Microbiology results: 5/23 BCx: pending    5/23 MRSA PCR: pending 5/23 COVID-19 (-)  Thank you for allowing pharmacy to be a part of this patient's care.  Paulina Fusi, PharmD, BCPS 10/24/2018 3:13 PM

## 2018-10-24 NOTE — Consult Note (Addendum)
PHARMACY CONSULT NOTE - FOLLOW UP  Pharmacy Consult for Electrolyte Monitoring and Replacement   Recent Labs: Potassium (mmol/L)  Date Value  10/24/2018 3.0 (L)   Magnesium (mg/dL)  Date Value  10/23/2018 1.4 (L)   Calcium (mg/dL)  Date Value  10/24/2018 6.6 (L)   Albumin (g/dL)  Date Value  10/21/2018 2.4 (L)   Phosphorus (mg/dL)  Date Value  10/22/2018 3.7   Sodium (mmol/L)  Date Value  10/24/2018 145     Assessment: Pt is critically ill and intubated on ventilator. Admitted with hyperkalemia.  KCl on 5/27 was 3.1 and pt received KCl 10 mEq x 4. K+ level is 3.0 and Mg is 1.4. Corrected Ca 7.9. Pt is also on D5 w/ KCl 40 mEq.   Goal of Therapy:  Electrolytes WNL   Plan:  Will give KCl 10 mEq x 4 and Mg 4 g IV x 1. Will recheck K+ and Mg with AM labs.   Oswald Hillock ,PharmD, BCPS Clinical Pharmacist 10/24/2018 5:59 AM

## 2018-10-25 ENCOUNTER — Inpatient Hospital Stay: Payer: Medicare HMO

## 2018-10-25 LAB — CBC WITH DIFFERENTIAL/PLATELET
Abs Immature Granulocytes: 0.26 K/uL — ABNORMAL HIGH (ref 0.00–0.07)
Basophils Absolute: 0.1 K/uL (ref 0.0–0.1)
Basophils Relative: 1 %
Eosinophils Absolute: 0.5 K/uL (ref 0.0–0.5)
Eosinophils Relative: 5 %
HCT: 27.6 % — ABNORMAL LOW (ref 39.0–52.0)
Hemoglobin: 8.6 g/dL — ABNORMAL LOW (ref 13.0–17.0)
Immature Granulocytes: 3 %
Lymphocytes Relative: 17 %
Lymphs Abs: 1.7 K/uL (ref 0.7–4.0)
MCH: 26.8 pg (ref 26.0–34.0)
MCHC: 31.2 g/dL (ref 30.0–36.0)
MCV: 86 fL (ref 80.0–100.0)
Monocytes Absolute: 0.7 K/uL (ref 0.1–1.0)
Monocytes Relative: 7 %
Neutro Abs: 6.9 K/uL (ref 1.7–7.7)
Neutrophils Relative %: 67 %
Platelets: 287 K/uL (ref 150–400)
RBC: 3.21 MIL/uL — ABNORMAL LOW (ref 4.22–5.81)
RDW: 14.9 % (ref 11.5–15.5)
WBC: 10.1 K/uL (ref 4.0–10.5)
nRBC: 0 % (ref 0.0–0.2)

## 2018-10-25 LAB — BASIC METABOLIC PANEL
Anion gap: 8 (ref 5–15)
BUN: 34 mg/dL — ABNORMAL HIGH (ref 8–23)
CO2: 24 mmol/L (ref 22–32)
Calcium: 6.4 mg/dL — CL (ref 8.9–10.3)
Chloride: 112 mmol/L — ABNORMAL HIGH (ref 98–111)
Creatinine, Ser: 0.87 mg/dL (ref 0.61–1.24)
GFR calc Af Amer: >60 mL/min (ref >60)
GFR calc non Af Amer: >60 mL/min (ref >60)
Glucose, Bld: 153 mg/dL — ABNORMAL HIGH (ref 70–99)
Potassium: 2.8 mmol/L — ABNORMAL LOW (ref 3.5–5.1)
Sodium: 144 mmol/L (ref 135–145)

## 2018-10-25 LAB — GLUCOSE, CAPILLARY
Glucose-Capillary: 128 mg/dL — ABNORMAL HIGH (ref 70–99)
Glucose-Capillary: 130 mg/dL — ABNORMAL HIGH (ref 70–99)
Glucose-Capillary: 134 mg/dL — ABNORMAL HIGH (ref 70–99)
Glucose-Capillary: 135 mg/dL — ABNORMAL HIGH (ref 70–99)
Glucose-Capillary: 136 mg/dL — ABNORMAL HIGH (ref 70–99)
Glucose-Capillary: 139 mg/dL — ABNORMAL HIGH (ref 70–99)
Glucose-Capillary: 204 mg/dL — ABNORMAL HIGH (ref 70–99)
Glucose-Capillary: 99 mg/dL (ref 70–99)

## 2018-10-25 LAB — MAGNESIUM: Magnesium: 1.8 mg/dL (ref 1.7–2.4)

## 2018-10-25 LAB — PHOSPHORUS: Phosphorus: <1 mg/dL — CL (ref 2.5–4.6)

## 2018-10-25 LAB — POTASSIUM: Potassium: 3.1 mmol/L — ABNORMAL LOW (ref 3.5–5.1)

## 2018-10-25 MED ORDER — POTASSIUM PHOSPHATES 15 MMOLE/5ML IV SOLN: 30.0000 mmol | Freq: Once | INTRAVENOUS | Status: DC

## 2018-10-25 MED ORDER — ORAL CARE MOUTH RINSE
15.0000 mL | Freq: Two times a day (BID) | OROMUCOSAL | Status: DC
  Administered 2018-10-25 – 2018-11-09 (×22): 15 mL via OROMUCOSAL

## 2018-10-25 MED ORDER — POTASSIUM PHOSPHATES 15 MMOLE/5ML IV SOLN
30.0000 mmol | Freq: Once | INTRAVENOUS | Status: AC
  Administered 2018-10-25: 30 mmol via INTRAVENOUS
  Filled 2018-10-25: qty 10

## 2018-10-25 MED ORDER — POTASSIUM CHLORIDE 10 MEQ/100ML IV SOLN
10.0000 meq | INTRAVENOUS | Status: AC
  Administered 2018-10-25 (×6): 10 meq via INTRAVENOUS
  Filled 2018-10-25 (×6): qty 100

## 2018-10-25 MED ORDER — MAGNESIUM SULFATE 2 GM/50ML IV SOLN
2.0000 g | Freq: Once | INTRAVENOUS | Status: AC
  Administered 2018-10-25: 2 g via INTRAVENOUS
  Filled 2018-10-25: qty 50

## 2018-10-25 MED ORDER — CHLORHEXIDINE GLUCONATE 0.12 % MT SOLN
15.0000 mL | Freq: Two times a day (BID) | OROMUCOSAL | Status: DC
  Administered 2018-10-25 – 2018-11-09 (×27): 15 mL via OROMUCOSAL
  Filled 2018-10-25 (×26): qty 15

## 2018-10-25 MED ORDER — POTASSIUM PHOSPHATES 15 MMOLE/5ML IV SOLN
30.0000 mmol | Freq: Once | INTRAVENOUS | Status: DC
  Filled 2018-10-25: qty 10

## 2018-10-25 MED ORDER — CHLORHEXIDINE GLUCONATE 0.12 % MT SOLN
OROMUCOSAL | Status: AC
  Administered 2018-10-25: 08:00:00 15 mL via OROMUCOSAL
  Filled 2018-10-25: qty 15

## 2018-10-25 NOTE — Progress Notes (Signed)
CRITICAL CARE NOTE       SUBJECTIVE FINDINGS & SIGNIFICANT EVENTS   Patient remains critically ill Prognosis is guarded  Patient had been successfully weaned from mechanical ventilation today.  He is exhibiting left-sided hemineglect with ability to blink to verbal communication only.  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  Will obtain neurology consultation and neuroimaging today. will   PAST MEDICAL HISTORY   Past Medical History:  Diagnosis Date   Allergy    Anxiety    Cataract    Cerebral hemorrhage ([place])    Chest pain, atypical    12/2003:  negative cardiolyte   Depression    Drug abuse ([place])    Eczema    Erectile dysfunction    History of meniscal tear    bilateral   HLD (hyperlipidemia)    HTN (hypertension)    Hx of tear of ACL (anterior cruciate ligament)    right   Insomnia    Olecranon bursitis of left elbow 10/2009   s/p I&D by Dr Maxie Better, initially assessed by Dr. Nori Riis    Prediabetes    Stroke [facility])    Suicidal behavior 04/13/2017   Pt states taking a bunch of sleeping pills to end life    TIA (transient ischemic attack)      SURGICAL HISTORY   Past Surgical History:  Procedure Laterality Date   EP IMPLANTABLE DEVICE N/A 12/30/2015   Procedure: Loop Recorder Insertion;  Surgeon: Thompson Grayer, MD;  Location: [facility];  Service: Cardiovascular;  Laterality: N/A;   ESOPHAGOGASTRODUODENOSCOPY N/A 10/03/2016   Procedure: ESOPHAGOGASTRODUODENOSCOPY (EGD);  Surgeon: Irene Shipper, MD;  Location: [facility] ENDOSCOPY;  Service: Endoscopy;  Laterality: N/A;   INCISE AND DRAIN ABCESS     L elbow due to cellulitis/bursitis   INNER EAR SURGERY     IR GENERIC HISTORICAL  12/28/2015   IR ANGIO VERTEBRAL SEL VERTEBRAL UNI L MOD SED 12/28/2015 Luanne Bras, MD [facility]   IR GENERIC HISTORICAL  12/28/2015   IR ANGIO VERTEBRAL SEL SUBCLAVIAN INNOMINATE UNI R MOD SED 12/28/2015 Luanne Bras, MD [facility]   IR GENERIC HISTORICAL  12/28/2015   IR ANGIO INTRA EXTRACRAN SEL INTERNAL CAROTID BILAT MOD SED 12/28/2015 Luanne Bras, MD [facility]   KNEE ARTHROSCOPY     RADIOLOGY WITH ANESTHESIA N/A 09/20/2015   Procedure: EMBOLIZATION         (RADIOLOGY WITH ANESTHESIA);  Surgeon: Luanne Bras, MD;  Location: [facility];  Service: Radiology;  Laterality: N/A;   SKIN TAG REMOVAL     11 removed   TEE WITHOUT CARDIOVERSION N/A 12/30/2015   Procedure: TRANSESOPHAGEAL ECHOCARDIOGRAM (TEE);  Surgeon: Larey Dresser, MD;  Location: [facility] ENDOSCOPY;  Service: Cardiovascular;  Laterality: N/A;     FAMILY HISTORY   Family History  Problem Relation Age of Onset   Stroke Mother    Hypertension Mother    Aneurysm Mother 73       Died of brain aneursym   Heart failure Father    Emphysema Father    Diabetes Mellitus II Sister    Colon cancer Neg Hx    Rectal cancer Neg Hx    Stomach cancer Neg Hx      SOCIAL HISTORY   Social History   Tobacco Use   Smoking status: Never Smoker   Smokeless tobacco: Never Used  Substance Use Topics   Alcohol use: No    Alcohol/week: 0.0 standard drinks   Drug use: No  MEDICATIONS   Current Medication:  Current Facility-Administered Medications:    chlorhexidine gluconate (MEDLINE KIT) (PERIDEX) 0.12 % solution 15 mL, 15 mL, Mouth Rinse, BID, Tukov-Yual, Magdalene S, NP, 15 mL at 10/24/18 1940   Chlorhexidine Gluconate Cloth 2 % PADS 6 each, 6 each, Topical, Q0600, Tyler Pita, MD, 6 each at 10/24/18 2220   dextrose 5 % 1,000 mL with potassium chloride 40 mEq infusion, , Intravenous, Continuous, Kolluru, Sarath, MD, Last Rate: 50 mL/hr at 10/25/18 0444   famotidine (PEPCID) IVPB 20 mg premix, 20 mg, Intravenous, BID, Eliezer Bottom T, MD, Stopped at 10/24/18  2143   feeding supplement (PRO-STAT SUGAR FREE 64) liquid 60 mL, 60 mL, Per Tube, QID, Tyler Pita, MD, 60 mL at 10/24/18 2118   feeding supplement (VITAL HIGH PROTEIN) liquid 1,000 mL, 1,000 mL, Per Tube, Q24H, Tyler Pita, MD, 1,000 mL at 10/25/18 0527   free water 200 mL, 200 mL, Per Tube, Q4H, Tyler Pita, MD, 200 mL at 10/25/18 0519   glycopyrrolate (ROBINUL) injection 0.1 mg, 0.1 mg, Intravenous, BID, Lanney Gins, Taronda Comacho, MD, 0.1 mg at 10/24/18 1741   heparin injection 5,000 Units, 5,000 Units, Subcutaneous, Q8H, Fritzi Mandes, MD, 5,000 Units at 10/25/18 0517   hydrocortisone sodium succinate (SOLU-CORTEF) 100 MG injection 50 mg, 50 mg, Intravenous, Q8H, Tyler Pita, MD, 50 mg at 10/25/18 0143   levETIRAcetam (KEPPRA) IVPB 500 mg/100 mL premix, 500 mg, Intravenous, Q12H, Tyler Pita, MD, Stopped at 10/24/18 2129   MEDLINE mouth rinse, 15 mL, Mouth Rinse, 10 times per day, Tukov-Yual, Magdalene S, NP, 15 mL at 10/25/18 5852   multivitamin liquid 15 mL, 15 mL, Per Tube, Daily, Tyler Pita, MD, 15 mL at 10/24/18 7782   mupirocin ointment (BACTROBAN) 2 % 1 application, 1 application, Nasal, BID, Ottie Glazier, MD, 1 application at 42/35/36 2116   nutrition supplement (JUVEN) (JUVEN) powder packet 1 packet, 1 packet, Per Tube, BID BM, Ottie Glazier, MD, 1 packet at 10/24/18 1442   phenylephrine (NEO-SYNEPHRINE) 10 mg in sodium chloride 0.9 % 250 mL (0.04 mg/mL) infusion, 0-400 mcg/min, Intravenous, Titrated, Fritzi Mandes, MD, Stopped at 10/22/18 0737   polyethylene glycol (MIRALAX / GLYCOLAX) packet 17 g, 17 g, Oral, Daily PRN, Fritzi Mandes, MD   potassium chloride 10 mEq in 100 mL IVPB, 10 mEq, Intravenous, Q1 Hr x 6, Hallaji, Sheema M, RPH, Last Rate: 100 mL/hr at 10/25/18 0638, 10 mEq at 10/25/18 1443   propofol (DIPRIVAN) 1000 MG/100ML infusion, 5-80 mcg/kg/min, Intravenous, Continuous, Earleen Newport, MD, Last Rate: 3.62 mL/hr at  10/25/18 0444, 5 mcg/kg/min at 10/25/18 0444    ALLERGIES   Metformin    REVIEW OF SYSTEMS     Review of system unable to obtain due to nonverbal patient  PHYSICAL EXAMINATION   Vitals:   10/25/18 0400 10/25/18 0500  BP: 107/68 108/67  Pulse: 60 65  Resp: 17 16  Temp: 99.3 F (37.4 C) 99.3 F (37.4 C)  SpO2: 97% 99%    GENERAL: Chronically ill-appearing HEAD: Normocephalic, atraumatic.  EYES: Pupils equal, round, reactive to light.  No scleral icterus.  MOUTH: Moist mucosal membrane. NECK: Supple. No thyromegaly. No nodules. No JVD.  PULMONARY: Mild bibasilar crackles CARDIOVASCULAR: S1 and S2. Regular rate and rhythm. No murmurs, rubs, or gallops.  GASTROINTESTINAL: Soft, nontender, non-distended. No masses. Positive bowel sounds. No hepatosplenomegaly.  MUSCULOSKELETAL: No lower extremity edema or rashes  NEUROLOGIC: Able to blink to verbal communication but unable to move all  4 extremities SKIN:intact,warm,dry   LABS AND IMAGING      LAB RESULTS: Recent Labs  Lab 10/23/18 0246 10/23/18 1002 10/24/18 0242 10/25/18 0302  NA 148*  --  145 144  K 2.9* 3.1* 3.0* 2.8*  CL 114*  --  114* 112*  CO2 24  --  24 24  BUN 28*  --  33* 34*  CREATININE 1.49*  --  1.02 0.87  GLUCOSE 165*  --  153* 153*   Recent Labs  Lab 10/23/18 0246 10/24/18 0242 10/25/18 0302  HGB 8.1* 8.2* 8.6*  HCT 26.6* 26.7* 27.6*  WBC 9.2 10.1 10.1  PLT 289 282 287     IMAGING RESULTS: Dg Chest Port 1 View  Result Date: 10/25/2018 CLINICAL DATA:  Acute respiratory failure. EXAM: PORTABLE CHEST 1 VIEW COMPARISON:  10/19/2018. FINDINGS: Endotracheal tube noted with its tip 3 cm above the carina. NG tube noted with its tip over the stomach. Cardiac monitoring device noted over the chest. Heart size stable. Low lung volumes with basilar atelectasis. Mild bibasilar infiltrates cannot be excluded. No pleural effusion or pneumothorax. IMPRESSION: 1.  Lines and tubes stable position. 2.  Low lung volumes with bibasilar atelectasis. Mild bibasilar infiltrates cannot be excluded. Electronically Signed   By: Marcello Moores  Register   On: 10/25/2018 06:12   Dg Abd Portable 1v  Result Date: 10/24/2018 CLINICAL DATA:  NG tube placement EXAM: PORTABLE ABDOMEN - 1 VIEW COMPARISON:  None. FINDINGS: NG tube extends into the stomach with side port chest below the GE junction. No dilated loops of large or small bowel. Gas and stool rectum. IMPRESSION: NG tube with tip in stomach.  No bowel obstruction. Electronically Signed   By: Suzy Bouchard M.D.   On: 10/24/2018 12:18      ASSESSMENT AND PLAN     -Multidisciplinary rounds held today   Acute encephalopathy -Complicated by history of hemorrhagic CVA -Initially thought to be toxic metabolic due to uremia and severe AKI -Concern for recurrent CVA, will place neuro consult and MRI brain  -Status post successful extubation   AcuteKidney Failure - KDIGO stage 4 -Resolved Nephrology on case - appreciate input  secondary to diabetic nephropathy.  -secondary to urinary obstruction/retention.     Hypernatremia:- RESOLVED today free water deficit of 6 liters - Increase D5W infusion to 13m/hr - free water if resuming tube feeds. -continue Full MV support -continue Bronchodilator Therapy -Wean Fio2 and PEEP as tolerated -will perform SAT/SBT when respiratory parameters are met    Septic shock  Improved - of urinary source as above  -urine culture pending - although may be innaccurate due to empiric antibiotic tx prior to collections -off vasopressor support now with improved hemodynamics    NEUROLOGY     -Neurology consultation placed Patient with left hemineglect and paralysis x4 Wake up assessment pending   -Official swallow evaluation prior to nourishment  ID -continue IV abx as prescibed -follow up cultures  GI/Nutrition GI PROPHYLAXIS as indicated DIET-->TF's as  tolerated Constipation protocol as indicated  ENDO - ICU hypoglycemic\Hyperglycemia protocol -check FSBS per protocol   ELECTROLYTES -follow labs as needed -replace as needed -pharmacy consultation   DVT/GI PRX ordered -SCDs  TRANSFUSIONS AS NEEDED MONITOR FSBS ASSESS the need for LABS as needed   Critical care provider statement:  Critical care time (minutes):31 Critical care time was exclusive of: Separately billable procedures and treating other patients Critical care was necessary to treat or prevent imminent or life-threatening deterioration of the following conditions:Septic  shock of urinary source, acute toxic metabolic encephalopathy, acute kidney injury stage IV, history of hemorrhagic CVA, multiple comorbid conditions Critical care was time spent personally by me on the following activities: Development of treatment plan with patient or surrogate, discussions with consultants, evaluation of patient's response to treatment, examination of patient, obtaining history from patient or surrogate, ordering and performing treatments and interventions, ordering and review of laboratory studies and re-evaluation of patient's condition. I assumed direction of critical care for this patient from another provider in my specialty: no   This document was prepared using Dragon voice recognition software and may include unintentional dictation errors.    Ottie Glazier, M.D.  Division of [place]

## 2018-10-25 NOTE — Consult Note (Signed)
PHARMACY CONSULT NOTE - FOLLOW UP  Pharmacy Consult for Electrolyte Monitoring and Replacement   Recent Labs: Potassium (mmol/L)  Date Value  10/25/2018 2.8 (L)   Magnesium (mg/dL)  Date Value  10/25/2018 1.8   Calcium (mg/dL)  Date Value  10/25/2018 6.4 (LL)   Albumin (g/dL)  Date Value  10/21/2018 2.4 (L)   Phosphorus (mg/dL)  Date Value  10/22/2018 3.7   Sodium (mmol/L)  Date Value  10/25/2018 144   Corrected Ca: ~ 7.9  Assessment: Pt is critically ill and intubated on ventilator. Admitted with sepsis and acute renal failure w/hyperkalemia.   Goal of Therapy:  Electrolytes WNL   Plan:  Will order Magnesium 2g IV x 1 dose and KCL 59mEq x 6 doses. Patient is also receiving D5W w/23mEq KCL @ 57mL/hr.  Will recheck K+ this evening @ 1800.   Recheck all electrolytes w/AM labs.   Pharmacy will continue to follow and replace electrolytes as needed.   Pernell Dupre, PharmD, BCPS Clinical Pharmacist 10/25/2018 4:12 AM

## 2018-10-25 NOTE — Progress Notes (Signed)
Updated pt sister via telephone regarding Mr. Duvall's plan of care.  She informed me at baseline pt is alert and oriented, follows commands, and able to feed himself. She states while at [facility] physical therapy worked with the pt prior to current hospital admission and the pt is primarily wheelchair bound, but with aggressive physical therapy he started standing on his own.  All questions were answered will continue to monitor and assess.    Marda Stalker, [place] Pager [phone] (please enter 7 digits) PCCM Consult Pager [phone] (please enter 7 digits)

## 2018-10-25 NOTE — Progress Notes (Signed)
Updated pts sister via telephone and informed her [name] was successfully extubated.  Marda Stalker, [place] Pager [phone] (please enter 7 digits) PCCM Consult Pager [phone] (please enter 7 digits)

## 2018-10-25 NOTE — Procedures (Signed)
Extubation Procedure Note  Patient Details:   Name: [name] DOB: 01-Oct-1955 MRN: [id number]   Airway Documentation:    Vent end date: 10/25/18 Vent end time: 1115   Evaluation  O2 sats: stable throughout Complications: No apparent complications Patient did tolerate procedure well. Bilateral Breath Sounds: Rhonchi   Yes   Extubated per MD order.  Cuff leak present.  Stable on 4lpm [place].  [name] 10/25/2018, 11:17 AM

## 2018-10-25 NOTE — Consult Note (Signed)
PHARMACY CONSULT NOTE - FOLLOW UP  Pharmacy Consult for Electrolyte Monitoring and Replacement   Recent Labs: Potassium (mmol/L)  Date Value  10/25/2018 3.1 (L)   Magnesium (mg/dL)  Date Value  10/25/2018 1.8   Calcium (mg/dL)  Date Value  10/25/2018 6.4 (LL)   Albumin (g/dL)  Date Value  10/21/2018 2.4 (L)   Phosphorus (mg/dL)  Date Value  10/25/2018 <1.0 (LL)   Sodium (mmol/L)  Date Value  10/25/2018 144   Corrected Ca: ~ 7.9  Assessment: Pt is critically ill and intubated on ventilator. Admitted with sepsis and acute renal failure w/hyperkalemia. Patient extubated this afternoon, concern for refeeding.   Goal of Therapy:  Electrolytes WNL   Plan:  5/29 18:00 K 3.1, Phos <0.1, replace with KPhos 33mmol IV once.   Recheck all electrolytes w/AM labs.   Pharmacy will continue to follow and replace electrolytes as needed.   Paulina Fusi, PharmD, BCPS 10/25/2018 7:04 PM

## 2018-10-25 NOTE — Progress Notes (Signed)
[place] [facility] [place] NAME: [name]    MR#:  [id number]  DATE OF BIRTH:  Jul 31, 1955  SUBJECTIVE:  extubated this morning. Still a bit confused. REVIEW OF SYSTEMS:   Review of Systems  Unable to perform ROS: Intubated   DRUG ALLERGIES:   Allergies  Allergen Reactions  . Metformin Diarrhea    VITALS:  Blood pressure 130/77, pulse 79, temperature 99.7 F (37.6 C), resp. rate 15, height 6' 0.01" (1.829 m), weight 103.8 kg, SpO2 97 %.  PHYSICAL EXAMINATION:   Physical Exam  GENERAL:  63 y.o.-year-old patient lying in the bed with no acute distress. Critically ill EYES: Pupils equal, round, reactive to light and accommodation. No scleral icterus. Extraocular muscles intact.  HEENT: Head atraumatic, normocephalic. Oropharynx and nasopharynx clear.  NECK:  Supple, no jugular venous distention. No thyroid enlargement, no tenderness.  LUNGS: decreased breath sounds bilaterally, no wheezing, rales, rhonchi. No use of accessory muscles of respiration.  CARDIOVASCULAR: S1, S2 normal. No murmurs, rubs, or gallops.  ABDOMEN: Soft, nontender, nondistended. Bowel sounds present. No organomegaly or mass.  EXTREMITIES: No cyanosis, clubbing o ++ edema b/l.    NEUROLOGIC: unable to assess. Patient just got extubated. Grossly nonfocal PSYCHIATRIC: just got extubated a little bit confused SKIN: No obvious rash, lesion, or ulcer--per RN   LABORATORY PANEL:  CBC Recent Labs  Lab 10/25/18 0302  WBC 10.1  HGB 8.6*  HCT 27.6*  PLT 287    Chemistries  Recent Labs  Lab 10/21/18 0402  10/25/18 0302  NA 150*   < > 144  K 3.3*   < > 2.8*  CL 114*   < > 112*  CO2 22   < > 24  GLUCOSE 185*   < > 153*  BUN 60*   < > 34*  CREATININE 4.90*   < > 0.87  CALCIUM 7.8*   < > 6.4*  MG  --    < > 1.8  AST 9*  --   --   ALT 11  --   --   ALKPHOS 43  --   --   BILITOT 0.8  --   --    < > = values in this interval not displayed.   Cardiac  Enzymes Recent Labs  Lab 10/19/18 0900  TROPONINI <0.03   RADIOLOGY:  Dg Chest Port 1 View  Result Date: 10/25/2018 CLINICAL DATA:  Acute respiratory failure. EXAM: PORTABLE CHEST 1 VIEW COMPARISON:  10/19/2018. FINDINGS: Endotracheal tube noted with its tip 3 cm above the carina. NG tube noted with its tip over the stomach. Cardiac monitoring device noted over the chest. Heart size stable. Low lung volumes with basilar atelectasis. Mild bibasilar infiltrates cannot be excluded. No pleural effusion or pneumothorax. IMPRESSION: 1.  Lines and tubes stable position. 2. Low lung volumes with bibasilar atelectasis. Mild bibasilar infiltrates cannot be excluded. Electronically Signed   By: Marcello Moores  Register   On: 10/25/2018 06:12   Dg Abd Portable 1v  Result Date: 10/24/2018 CLINICAL DATA:  NG tube placement EXAM: PORTABLE ABDOMEN - 1 VIEW COMPARISON:  None. FINDINGS: NG tube extends into the stomach with side port chest below the GE junction. No dilated loops of large or small bowel. Gas and stool rectum. IMPRESSION: NG tube with tip in stomach.  No bowel obstruction. Electronically Signed   By: Suzy Bouchard M.D.   On: 10/24/2018 12:18   ASSESSMENT AND PLAN:  [name]  is  a 63 y.o. male with a known history of cerebral hemorrhage, acute hypoxic respiratory failure was admitted at [facility] this year underwent head tracheostomy, history of CVA, hypertension, depression and history of drug abuse comes to the emergency room after he was found with low blood sugar of 34 at the facility. Patient received D10 why EMS came to the emergency room remains unresponsive despite sternal rub got intubated and placed on the ventilator for every protection.  1. Sepsis/septic shock due to urinary tract infection however UC negative -broad-spectrum antibiotic with cefepime--all cultures negative D/c abxs - blood culture negative -off IV neo-synephrine -on  IV solucortef -extubated  2. Acute renal failure  suspected due to urinary retention with history of prostate enlargement (on-Cardura) -baseline creatinine 0.53 in March 2020 -nephrology consultation with Dr. Zollie Scale spoke with him -avoid nephrotoxins - renal ultrasound-showed bilateral hydronephrosis -creatinine 8.53--- 7.59-- 6.98--4.9--2.62--1.49--1.02-0.8  3. Hyperkalemia/ hypernatremia -IV dextrose with insulin, calcium gluconate, bicarb drip -resolved K 4.7 -sodium trending down 150--148  4. Acute encephalopathy due to hypoglycemia and intubated for airway protection -CT  Head Left frontal craniotomy. Encephalomalacia in the anterior frontal 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  5. History of cerebral hemorrhage s/p clipped aneurysm. - Patient on Keppra for seizure precautions will change to IV Keppra 750 BID  6. DVT prophylaxis subcu heparin   CODE STATUS: full  DVT Prophylaxis:heparin  TOTAL critical TIME TAKING CARE OF THIS PATIENT: *25* minutes.  >50% time spent on counselling and coordination of care  POSSIBLE D/C IN  ?DAYS, DEPENDING ON CLINICAL CONDITION.  Note: This dictation was prepared with Dragon dictation along with smaller phrase technology. Any transcriptional errors that result from this process are unintentional.  Fritzi Mandes M.D on 10/25/2018 at 4:18 PM  Between 7am to 6pm - Pager - [phone]  After 6pm go to [email or web address] - password [email or web address] [place] [facility]  Office  [phone]  CC: Primary care physician; Garwin Brothers, MDPatient ID: [name], male   DOB: 1956-04-04, 63 y.o.   MRN: [id number]

## 2018-10-26 DIAGNOSIS — R4182 Altered mental status, unspecified: Secondary | ICD-10-CM

## 2018-10-26 LAB — CBC WITH DIFFERENTIAL/PLATELET
Abs Immature Granulocytes: 0.34 10*3/uL — ABNORMAL HIGH (ref 0.00–0.07)
Basophils Absolute: 0.1 10*3/uL (ref 0.0–0.1)
Basophils Relative: 1 %
Eosinophils Absolute: 0.5 10*3/uL (ref 0.0–0.5)
Eosinophils Relative: 4 %
HCT: 30 % — ABNORMAL LOW (ref 39.0–52.0)
Hemoglobin: 9.2 g/dL — ABNORMAL LOW (ref 13.0–17.0)
Immature Granulocytes: 3 %
Lymphocytes Relative: 15 %
Lymphs Abs: 1.7 10*3/uL (ref 0.7–4.0)
MCH: 26.4 pg (ref 26.0–34.0)
MCHC: 30.7 g/dL (ref 30.0–36.0)
MCV: 86.2 fL (ref 80.0–100.0)
Monocytes Absolute: 0.7 10*3/uL (ref 0.1–1.0)
Monocytes Relative: 6 %
Neutro Abs: 7.8 10*3/uL — ABNORMAL HIGH (ref 1.7–7.7)
Neutrophils Relative %: 71 %
Platelets: 293 10*3/uL (ref 150–400)
RBC: 3.48 MIL/uL — ABNORMAL LOW (ref 4.22–5.81)
RDW: 15.1 % (ref 11.5–15.5)
WBC: 11 10*3/uL — ABNORMAL HIGH (ref 4.0–10.5)
nRBC: 0 % (ref 0.0–0.2)

## 2018-10-26 LAB — BASIC METABOLIC PANEL
Anion gap: 8 (ref 5–15)
BUN: 20 mg/dL (ref 8–23)
CO2: 23 mmol/L (ref 22–32)
Calcium: 6.6 mg/dL — ABNORMAL LOW (ref 8.9–10.3)
Chloride: 111 mmol/L (ref 98–111)
Creatinine, Ser: 0.73 mg/dL (ref 0.61–1.24)
GFR calc Af Amer: >60 mL/min (ref >60)
GFR calc non Af Amer: >60 mL/min (ref >60)
Glucose, Bld: 142 mg/dL — ABNORMAL HIGH (ref 70–99)
Potassium: 3 mmol/L — ABNORMAL LOW (ref 3.5–5.1)
Sodium: 142 mmol/L (ref 135–145)

## 2018-10-26 LAB — MAGNESIUM: Magnesium: 1.7 mg/dL (ref 1.7–2.4)

## 2018-10-26 LAB — GLUCOSE, CAPILLARY
Glucose-Capillary: 103 mg/dL — ABNORMAL HIGH (ref 70–99)
Glucose-Capillary: 110 mg/dL — ABNORMAL HIGH (ref 70–99)
Glucose-Capillary: 114 mg/dL — ABNORMAL HIGH (ref 70–99)
Glucose-Capillary: 117 mg/dL — ABNORMAL HIGH (ref 70–99)
Glucose-Capillary: 128 mg/dL — ABNORMAL HIGH (ref 70–99)
Glucose-Capillary: 131 mg/dL — ABNORMAL HIGH (ref 70–99)

## 2018-10-26 LAB — PHOSPHORUS: Phosphorus: 2.4 mg/dL — ABNORMAL LOW (ref 2.5–4.6)

## 2018-10-26 LAB — POTASSIUM: Potassium: 3.7 mmol/L (ref 3.5–5.1)

## 2018-10-26 MED ORDER — MODAFINIL 100 MG PO TABS
100.0000 mg | ORAL_TABLET | Freq: Two times a day (BID) | ORAL | Status: DC
  Administered 2018-10-28 – 2018-10-30 (×5): 100 mg via ORAL
  Filled 2018-10-26 (×6): qty 1

## 2018-10-26 MED ORDER — POTASSIUM PHOSPHATES 15 MMOLE/5ML IV SOLN
10.0000 mmol | Freq: Once | INTRAVENOUS | Status: AC
  Administered 2018-10-26: 10 mmol via INTRAVENOUS
  Filled 2018-10-26: qty 3.33

## 2018-10-26 MED ORDER — POTASSIUM CHLORIDE 10 MEQ/100ML IV SOLN
10.0000 meq | INTRAVENOUS | Status: AC
  Administered 2018-10-26 (×5): 10 meq via INTRAVENOUS
  Filled 2018-10-26 (×5): qty 100

## 2018-10-26 MED ORDER — MAGNESIUM SULFATE 2 GM/50ML IV SOLN
2.0000 g | Freq: Once | INTRAVENOUS | Status: AC
  Administered 2018-10-26: 06:00:00 2 g via INTRAVENOUS
  Filled 2018-10-26: qty 50

## 2018-10-26 NOTE — Consult Note (Signed)
PHARMACY CONSULT NOTE - FOLLOW UP  Pharmacy Consult for Electrolyte Monitoring and Replacement   Recent Labs: Potassium (mmol/L)  Date Value  10/26/2018 3.7   Magnesium (mg/dL)  Date Value  10/26/2018 1.7   Calcium (mg/dL)  Date Value  10/26/2018 6.6 (L)   Albumin (g/dL)  Date Value  10/21/2018 2.4 (L)   Phosphorus (mg/dL)  Date Value  10/26/2018 2.4 (L)   Sodium (mmol/L)  Date Value  10/26/2018 142   Corrected Ca: ~ 7.9  Assessment: Pt is critically ill and intubated on ventilator. Admitted with sepsis and acute renal failure w/hyperkalemia. Patient extubated this afternoon, concern for refeeding.   Goal of Therapy:  Electrolytes WNL   Plan:  5/30 @ 0400 K 3.0, Phos 2.4, Mg: 1.7 5/30 @ 1929 K 3.7. Np Replacement needed at this time.   Recheck all electrolytes w/AM labs.   Pharmacy will continue to follow and replace electrolytes as needed.   Rowland Lathe, PharmD Clinical Pharmacist 10/26/2018 7:46 PM

## 2018-10-26 NOTE — Consult Note (Signed)
Reason for Consult: encephalopathy Referring Physician: Dr. Tressia Miners   CC: encephalopathy   HPI: [name] is an 63 y.o. male with a known history of cerebral hemorrhage s/p aneurismal coil, acute hypoxic respiratory failure was admitted at [facility] this year underwenthead tracheostomy, history of CVA, hypertension, depression and history of drug abuse comes to the emergency room after he was found hypoglycemic. Pt has been extubated but is not following any commands.    Past Medical History:  Diagnosis Date  . Allergy   . Anxiety   . Cataract   . Cerebral hemorrhage ([place])   . Chest pain, atypical    12/2003:  negative cardiolyte  . Depression   . Drug abuse ([place])   . Eczema   . Erectile dysfunction   . History of meniscal tear    bilateral  . HLD (hyperlipidemia)   . HTN (hypertension)   . Hx of tear of ACL (anterior cruciate ligament)    right  . Insomnia   . Olecranon bursitis of left elbow 10/2009   s/p I&D by Dr Maxie Better, initially assessed by Dr. Nori Riis   . Prediabetes   . Stroke ([place])   . Suicidal behavior 04/13/2017   Pt states taking a bunch of sleeping pills to end life   . TIA (transient ischemic attack)     Past Surgical History:  Procedure Laterality Date  . EP IMPLANTABLE DEVICE N/A 12/30/2015   Procedure: Loop Recorder Insertion;  Surgeon: Thompson Grayer, MD;  Location: [place] CV LAB;  Service: Cardiovascular;  Laterality: N/A;  . ESOPHAGOGASTRODUODENOSCOPY N/A 10/03/2016   Procedure: ESOPHAGOGASTRODUODENOSCOPY (EGD);  Surgeon: Irene Shipper, MD;  Location: [facility] ENDOSCOPY;  Service: Endoscopy;  Laterality: N/A;  . INCISE AND DRAIN ABCESS     L elbow due to cellulitis/bursitis  . INNER EAR SURGERY    . IR GENERIC HISTORICAL  12/28/2015   IR ANGIO VERTEBRAL SEL VERTEBRAL UNI L MOD SED 12/28/2015 Luanne Bras, MD [facility]INTER[facility]  . IR GENERIC HISTORICAL  12/28/2015   IR ANGIO VERTEBRAL SEL SUBCLAVIAN INNOMINATE UNI R MOD SED 12/28/2015 Luanne Bras, MD [facility]   . IR GENERIC HISTORICAL  12/28/2015   IR ANGIO INTRA EXTRACRAN SEL INTERNAL CAROTID BILAT MOD SED 12/28/2015 Luanne Bras, MD [facility]  . KNEE ARTHROSCOPY    . RADIOLOGY WITH ANESTHESIA N/A 09/20/2015   Procedure: EMBOLIZATION         (RADIOLOGY WITH ANESTHESIA);  Surgeon: Luanne Bras, MD;  Location: [place];  Service: Radiology;  Laterality: N/A;  . SKIN TAG REMOVAL     11 removed  . TEE WITHOUT CARDIOVERSION N/A 12/30/2015   Procedure: TRANSESOPHAGEAL ECHOCARDIOGRAM (TEE);  Surgeon: Larey Dresser, MD;  Location[facility]COPY;  Service: Cardiovascular;  Laterality: N/A;    Family History  Problem Relation Age of Onset  . Stroke Mother   . Hypertension Mother   . Aneurysm Mother 95       Died of brain aneursym  . Heart failure Father   . Emphysema Father   . Diabetes Mellitus II Sister   . Colon cancer Neg Hx   . Rectal cancer Neg Hx   . Stomach cancer Neg Hx     Social History:  reports that he has never smoked. He has never used smokeless tobacco. He reports that he does not drink alcohol or use drugs.  Allergies  Allergen Reactions  . Metformin Diarrhea    Medications: I have reviewed the patient's current medications.  ROS: Unable  to determine as not following commands.   Physical Examination: Blood pressure 123/66, pulse 70, temperature 98.2 F (36.8 C), resp. rate 17, height 6' 0.01" (1.829 m), weight 98.3 kg, SpO2 99 %.  Neurological Examination   Mental Status: [place] doesn't follow  Cranial Nerves: II: Discs flat bilaterally;  V,VII: smile symmetric VIII: hearing normal bilaterally IX,X: gag reflex present XI: bilateral shoulder shrug XII: midline tongue extension Motor: Moves all extremities symmetrically   Laboratory Studies:   Basic Metabolic Panel: Recent Labs  Lab 10/19/18 2306 10/20/18 0438  10/22/18 0532 10/23/18 0246 10/23/18 1002 10/24/18 0242 10/25/18 0302 10/25/18 1819 10/26/18 0409  NA 148* 151*   < > 155* 148*  --  145  144  --  142  K 4.9 4.7   < > 3.3* 2.9* 3.1* 3.0* 2.8* 3.1* 3.0*  CL 115* 114*   < > 120* 114*  --  114* 112*  --  111  CO2 21* 25   < > 25 24  --  24 24  --  23  GLUCOSE 123* 190*   < > 177* 165*  --  153* 153*  --  142*  BUN 80* 77*   < > 48* 28*  --  33* 34*  --  20  CREATININE 7.59* 6.98*   < > 2.62* 1.49*  --  1.02 0.87  --  0.73  CALCIUM 7.8* 8.1*   < > 7.9* 7.1*  --  6.6* 6.4*  --  6.6*  MG 2.3 2.2  --  1.8 1.4*  --   --  1.8  --  1.7  PHOS 5.8* 5.9*  --  3.7  --   --   --   --  <1.0* 2.4*   < > = values in this interval not displayed.    Liver Function Tests: Recent Labs  Lab 10/19/18 2025 10/20/18 0438 10/21/18 0402  AST  --  9* 9*  ALT  --  11 11  ALKPHOS  --  50 43  BILITOT  --  0.6 0.8  PROT  --  5.9* 5.9*  ALBUMIN 2.5* 2.8* 2.4*   No results for input(s): LIPASE, AMYLASE in the last 168 hours. No results for input(s): AMMONIA in the last 168 hours.  CBC: Recent Labs  Lab 10/22/18 0532 10/23/18 0246 10/24/18 0242 10/25/18 0302 10/26/18 0409  WBC 10.9* 9.2 10.1 10.1 11.0*  NEUTROABS 8.5*  --  7.0 6.9 7.8*  HGB 8.8* 8.1* 8.2* 8.6* 9.2*  HCT 29.4* 26.6* 26.7* 27.6* 30.0*  MCV 90.2 89.0 87.0 86.0 86.2  PLT 291 289 282 287 293    Cardiac Enzymes: No results for input(s): CKTOTAL, CKMB, CKMBINDEX, TROPONINI in the last 168 hours.  BNP: Invalid input(s): POCBNP  CBG: Recent Labs  Lab 10/25/18 1946 10/25/18 2327 10/25/18 2329 10/26/18 0343 10/26/18 0733  GLUCAP 128* 204* 139* 128* 114*    Microbiology: Results for orders placed or performed during the hospital encounter of 10/19/18  SARS Coronavirus 2 (CEPHEID - Performed in [facility] hospital lab), Hosp Order     Status: None   Collection Time: 10/19/18  9:28 AM  Result Value Ref Range Status   SARS Coronavirus 2 NEGATIVE NEGATIVE Final    Comment: (NOTE) If result is NEGATIVE SARS-CoV-2 target nucleic acids are NOT DETECTED. The SARS-CoV-2 RNA is generally detectable in upper and lower   respiratory specimens during the acute phase of infection. The lowest  concentration of SARS-CoV-2 viral copies this assay can  detect is 250  copies / mL. A negative result does not preclude SARS-CoV-2 infection  and should not be used as the sole basis for treatment or other  patient management decisions.  A negative result may occur with  improper specimen collection / handling, submission of specimen other  than nasopharyngeal swab, presence of viral mutation(s) within the  areas targeted by this assay, and inadequate number of viral copies  (<250 copies / mL). A negative result must be combined with clinical  observations, patient history, and epidemiological information. If result is POSITIVE SARS-CoV-2 target nucleic acids are DETECTED. The SARS-CoV-2 RNA is generally detectable in upper and lower  respiratory specimens dur ing the acute phase of infection.  Positive  results are indicative of active infection with SARS-CoV-2.  Clinical  correlation with patient history and other diagnostic information is  necessary to determine patient infection status.  Positive results do  not rule out bacterial infection or co-infection with other viruses. If result is PRESUMPTIVE POSTIVE SARS-CoV-2 nucleic acids MAY BE PRESENT.   A presumptive positive result was obtained on the submitted specimen  and confirmed on repeat testing.  While 2019 novel coronavirus  (SARS-CoV-2) nucleic acids may be present in the submitted sample  additional confirmatory testing may be necessary for epidemiological  and / or clinical management purposes  to differentiate between  SARS-CoV-2 and other Sarbecovirus currently known to infect humans.  If clinically indicated additional testing with an alternate test  methodology [phone]) is advised. The SARS-CoV-2 RNA is generally  detectable in upper and lower respiratory sp ecimens during the acute  phase of infection. The expected result is Negative. Fact  Sheet for Patients:  StrictlyIdeas.no Fact Sheet for Healthcare Providers: BankingDealers.co.za This test is not yet approved or cleared by the [place] FDA and has been authorized for detection and/or diagnosis of SARS-CoV-2 by FDA under an Emergency Use Authorization (EUA).  This EUA will remain in effect (meaning this test can be used) for the duration of the COVID-19 declaration under Section 564(b)(1) of the Act, 21 U.S.C. section 360bbb-3(b)(1), unless the authorization is terminated or revoked sooner. Performed at [facility], [address] [place], [place] [address]   Blood culture (routine x 2)     Status: None   Collection Time: 10/19/18  9:36 AM  Result Value Ref Range Status   Specimen Description BLOOD LUA  Final   Special Requests   Final    BOTTLES DRAWN AEROBIC AND ANAEROBIC Blood Culture adequate volume   Culture   Final    NO GROWTH 5 DAYS Performed at [facility], [address] [place], [place] [address]    Report Status 10/24/2018 FINAL  Final  Blood culture (routine x 2)     Status: None   Collection Time: 10/19/18 10:16 AM  Result Value Ref Range Status   Specimen Description BLOOD LFA  Final   Special Requests   Final    BOTTLES DRAWN AEROBIC AND ANAEROBIC Blood Culture results may not be optimal due to an inadequate volume of blood received in culture bottles   Culture   Final    NO GROWTH 5 DAYS Performed at [facility], [address], [place],  [address]    Report Status 10/24/2018 FINAL  Final  MRSA PCR Screening     Status: Abnormal   Collection Time: 10/19/18  1:06 PM  Result Value Ref Range Status   MRSA by PCR POSITIVE (A) NEGATIVE Final  Comment:        The GeneXpert MRSA Assay (FDA approved for NASAL specimens only), is one component of a comprehensive MRSA colonization surveillance program. It is not intended to diagnose MRSA infection nor  to guide or monitor treatment for MRSA infections. CRITICAL RESULT CALLED TO, READ BACK BY AND VERIFIED WITH: CALLED TO TANYA SILVA @1426  10/19/2018 [facility] Performed at [facility], [address], [place], [place] [address]   Urine Culture     Status: None   Collection Time: 10/21/18  4:10 PM  Result Value Ref Range Status   Specimen Description   Final    URINE, CATHETERIZED Performed at [facility], [address], [place], [place] [address]    Special Requests   Final    Normal Performed at [facility], [address] [place], [place] [address]    Culture   Final    NO GROWTH Performed at [facility], [place] [address], [place], [place] [address]    Report Status 10/22/2018 FINAL  Final    Coagulation Studies: No results for input(s): LABPROT, INR in the last 72 hours.  Urinalysis: No results for input(s): COLORURINE, LABSPEC, PHURINE, GLUCOSEU, HGBUR, BILIRUBINUR, KETONESUR, PROTEINUR, UROBILINOGEN, NITRITE, LEUKOCYTESUR in the last 168 hours.  Invalid input(s): APPERANCEUR  Lipid Panel:     Component Value Date/Time   CHOL 124 04/14/2017 0328   TRIG 107 04/14/2017 0328   HDL 29 (L) 04/14/2017 0328   CHOLHDL 4.3 04/14/2017 0328   VLDL 21 04/14/2017 0328   LDLCALC 74 04/14/2017 0328    HgbA1C:  Lab Results  Component Value Date   HGBA1C 6.2 (H) 04/14/2017    Urine Drug Screen:      Component Value Date/Time   LABOPIA NONE DETECTED 04/08/2017 1718   COCAINSCRNUR NONE DETECTED 04/08/2017 1718   COCAINSCRNUR NEG 06/07/2006 0039   LABBENZ POSITIVE (A) 04/08/2017 1718   LABBENZ NEG 06/07/2006 0039   AMPHETMU NONE DETECTED 04/08/2017 1718   THCU NONE DETECTED 04/08/2017 1718   LABBARB NONE DETECTED 04/08/2017 1718    Alcohol Level: No results for input(s): ETH in the last 168 hours.  Other results: EKG: normal EKG, normal sinus rhythm, unchanged from previous tracings.  Imaging: Ct Head Wo Contrast  Result Date:  10/25/2018 CLINICAL DATA:  Altered mental status EXAM: CT HEAD WITHOUT CONTRAST TECHNIQUE: Contiguous axial images were obtained from the base of the skull through the vertex without intravenous contrast. COMPARISON:  10/19/2018 FINDINGS: Brain: There is bifrontal encephalomalacia with and anterior communicating artery aneurysm clip in unchanged position. There is no hemorrhage mass or extra-axial collection. Vascular: Mild carotid atherosclerosis. Skull: Remote left frontal craniotomy. Sinuses/Orbits: Left-sided cochlear implant with streak artifact obscuring the adjacent brain. Paranasal sinuses are clear. Right mastoid and middle ear clear. The orbits are normal. IMPRESSION: 1. No acute intracranial abnormality. 2. Unchanged appearance of bifrontal encephalomalacia and clipping of anterior cerebral aneurysm. Electronically Signed   By: Ulyses Jarred M.D.   On: 10/25/2018 17:48   Dg Chest Port 1 View  Result Date: 10/25/2018 CLINICAL DATA:  Acute respiratory failure. EXAM: PORTABLE CHEST 1 VIEW COMPARISON:  10/19/2018. FINDINGS: Endotracheal tube noted with its tip 3 cm above the carina. NG tube noted with its tip over the stomach. Cardiac monitoring device noted over the chest. Heart size stable. Low lung volumes with basilar atelectasis. Mild bibasilar infiltrates cannot be excluded. No pleural effusion or pneumothorax. IMPRESSION: 1.  Lines and tubes stable position. 2. Low lung  volumes with bibasilar atelectasis. Mild bibasilar infiltrates cannot be excluded. Electronically Signed   By: Marcello Moores  Register   On: 10/25/2018 06:12   Dg Abd Portable 1v  Result Date: 10/24/2018 CLINICAL DATA:  NG tube placement EXAM: PORTABLE ABDOMEN - 1 VIEW COMPARISON:  None. FINDINGS: NG tube extends into the stomach with side port chest below the GE junction. No dilated loops of large or small bowel. Gas and stool rectum. IMPRESSION: NG tube with tip in stomach.  No bowel obstruction. Electronically Signed   By: Suzy Bouchard M.D.   On: 10/24/2018 12:18     Assessment/Plan: 63 y.o. male with a known history of cerebral hemorrhage s/p aneurismal coil, acute hypoxic respiratory failure was admitted at [facility] this year underwenthead tracheostomy, history of CVA, hypertension, depression and history of drug abuse comes to the emergency room after he was found hypoglycemic. Pt has been extubated but is not following any commands.   - CTH done and chronic ACA aneurism with encephalomalacia but no acute abnormality - possibly related to metabolic encephalopathy vs hypoactive deliriuim - Provigil 100 BID started - Unable to obtain MRI due to clipping of aneurism  10/26/2018, 11:08 AM

## 2018-10-26 NOTE — Plan of Care (Signed)
Pt remains nonverbal. Unable to assess neuro status.  Does not follow commands.  VSS. Skin integrity stable

## 2018-10-26 NOTE — Progress Notes (Signed)
CRITICAL CARE NOTE        SUBJECTIVE FINDINGS & SIGNIFICANT EVENTS   Patient remains critically ill Prognosis is guarded Patient continues to have a very poor neurologic exam, is unable to follow any verbal communication however spontaneously opens eyes. -Discussed with neurologist-plan for medical management at this point, possible additional neuroimaging.  Overall very poor prognosis  PAST MEDICAL HISTORY   Past Medical History:  Diagnosis Date  . Allergy   . Anxiety   . Cataract   . Cerebral hemorrhage ([place])   . Chest pain, atypical    12/2003:  negative cardiolyte  . Depression   . Drug abuse ([place])   . Eczema   . Erectile dysfunction   . History of meniscal tear    bilateral  . HLD (hyperlipidemia)   . HTN (hypertension)   . Hx of tear of ACL (anterior cruciate ligament)    right  . Insomnia   . Olecranon bursitis of left elbow 10/2009   s/p I&D by Dr Maxie Better, initially assessed by Dr. Nori Riis   . Prediabetes   . Stroke ([place])   . Suicidal behavior 04/13/2017   Pt states taking a bunch of sleeping pills to end life   . TIA (transient ischemic attack)      SURGICAL HISTORY   Past Surgical History:  Procedure Laterality Date  . EP IMPLANTABLE DEVICE N/A 12/30/2015   Procedure: Loop Recorder Insertion;  Surgeon: Thompson Grayer, MD;  Location: [facility];  Service: Cardiovascular;  Laterality: N/A;  . ESOPHAGOGASTRODUODENOSCOPY N/A 10/03/2016   Procedure: ESOPHAGOGASTRODUODENOSCOPY (EGD);  Surgeon: Irene Shipper, MD;  Location: [facility];  Service: Endoscopy;  Laterality: N/A;  . INCISE AND DRAIN ABCESS     L elbow due to cellulitis/bursitis  . INNER EAR SURGERY    . IR GENERIC HISTORICAL  12/28/2015   IR ANGIO VERTEBRAL SEL VERTEBRAL UNI L MOD SED 12/28/2015 Luanne Bras, MD [facility]   . IR GENERIC HISTORICAL  12/28/2015   IR ANGIO VERTEBRAL SEL SUBCLAVIAN INNOMINATE UNI R MOD SED 12/28/2015 Luanne Bras, MD [facility]  . IR GENERIC HISTORICAL  12/28/2015   IR ANGIO INTRA EXTRACRAN SEL INTERNAL CAROTID BILAT MOD SED 12/28/2015 Luanne Bras, MD [facility]  . KNEE ARTHROSCOPY    . RADIOLOGY WITH ANESTHESIA N/A 09/20/2015   Procedure: EMBOLIZATION         (RADIOLOGY WITH ANESTHESIA);  Surgeon: Luanne Bras, MD;  Location: [facility];  Service: Radiology;  Laterality: N/A;  . SKIN TAG REMOVAL     11 removed  . TEE WITHOUT CARDIOVERSION N/A 12/30/2015   Procedure: TRANSESOPHAGEAL ECHOCARDIOGRAM (TEE);  Surgeon: Larey Dresser, MD;  Location: [facility];  Service: Cardiovascular;  Laterality: N/A;     FAMILY HISTORY   Family History  Problem Relation Age of Onset  . Stroke Mother   . Hypertension Mother   . Aneurysm Mother 29       Died of brain aneursym  . Heart failure Father   . Emphysema Father   . Diabetes Mellitus II Sister   . Colon cancer Neg Hx   . Rectal cancer Neg Hx   . Stomach cancer Neg Hx      SOCIAL HISTORY   Social History   Tobacco Use  . Smoking status: Never Smoker  . Smokeless tobacco: Never Used  Substance Use Topics  . Alcohol use: No    Alcohol/week: 0.0 standard drinks  . Drug use: No     MEDICATIONS  Current Medication:  Current Facility-Administered Medications:  .  chlorhexidine (PERIDEX) 0.12 % solution 15 mL, 15 mL, Mouth Rinse, BID, Lanney Gins, Mazzie Brodrick, MD, 15 mL at 10/25/18 2116 .  Chlorhexidine Gluconate Cloth 2 % PADS 6 each, 6 each, Topical, Q0600, Tyler Pita, MD, 6 each at 10/25/18 2211 .  dextrose 5 % 1,000 mL with potassium chloride 40 mEq infusion, , Intravenous, Continuous, Kolluru, Sarath, MD, Stopped at 10/26/18 0550 .  famotidine (PEPCID) IVPB 20 mg premix, 20 mg, Intravenous, BID, [place], Shona Needles, MD, Stopped at 10/25/18 2143 .  heparin injection 5,000 Units, 5,000 Units, Subcutaneous, Q8H,  Fritzi Mandes, MD, 5,000 Units at 10/26/18 [phone] .  hydrocortisone sodium succinate (SOLU-CORTEF) 100 MG injection 50 mg, 50 mg, Intravenous, Q8H, Tyler Pita, MD, 50 mg at 10/26/18 0102 .  levETIRAcetam (KEPPRA) IVPB 500 mg/100 mL premix, 500 mg, Intravenous, Q12H, Tyler Pita, MD, Stopped at 10/25/18 2127 .  MEDLINE mouth rinse, 15 mL, Mouth Rinse, q12n4p, Keshayla Schrum, MD, 15 mL at 10/25/18 1620 .  mupirocin ointment (BACTROBAN) 2 % 1 application, 1 application, Nasal, BID, Ottie Glazier, MD, 1 application at 42/70/62 2114 .  polyethylene glycol (MIRALAX / GLYCOLAX) packet 17 g, 17 g, Oral, Daily PRN, Fritzi Mandes, MD .  potassium chloride 10 mEq in 100 mL IVPB, 10 mEq, Intravenous, Q1 Hr x 5, Hallaji, Sheema M, RPH, Last Rate: 100 mL/hr at 10/26/18 0610 .  potassium PHOSPHATE 10 mmol in dextrose 5 % 250 mL infusion, 10 mmol, Intravenous, Once, Hallaji, Sheema M, RPH    ALLERGIES   Metformin    REVIEW OF SYSTEMS    Able to obtain due to nonverbal state  PHYSICAL EXAMINATION   Vitals:   10/26/18 0500 10/26/18 0600  BP: 112/65 116/68  Pulse: 68 71  Resp: 16 16  Temp: 98.4 F (36.9 C) 98.2 F (36.8 C)  SpO2: 99% 98%    GENERAL: Chronically ill-appearing no apparent distress HEAD: Normocephalic, atraumatic.  EYES: Pupils equal, round, reactive to light.  No scleral icterus.  MOUTH: Moist mucosal membrane. NECK: Supple. No thyromegaly. No nodules. No JVD.  PULMONARY: Mild bibasilar crackles  CARDIOVASCULAR: S1 and S2. Regular rate and rhythm. No murmurs, rubs, or gallops.  GASTROINTESTINAL: Soft, nontender, non-distended. No masses. Positive bowel sounds. No hepatosplenomegaly.  MUSCULOSKELETAL: No swelling, clubbing, or edema.  NEUROLOGIC: Mild distress due to acute illness SKIN:intact,warm,dry   LABS AND IMAGING      LAB RESULTS: Recent Labs  Lab 10/24/18 0242 10/25/18 0302 10/25/18 1819 10/26/18 0409  NA 145 144  --  142  K 3.0* 2.8* 3.1*  3.0*  CL 114* 112*  --  111  CO2 24 24  --  23  BUN 33* 34*  --  20  CREATININE 1.02 0.87  --  0.73  GLUCOSE 153* 153*  --  142*   Recent Labs  Lab 10/24/18 0242 10/25/18 0302 10/26/18 0409  HGB 8.2* 8.6* 9.2*  HCT 26.7* 27.6* 30.0*  WBC 10.1 10.1 11.0*  PLT 282 287 293     IMAGING RESULTS: Ct Head Wo Contrast  Result Date: 10/25/2018 CLINICAL DATA:  Altered mental status EXAM: CT HEAD WITHOUT CONTRAST TECHNIQUE: Contiguous axial images were obtained from the base of the skull through the vertex without intravenous contrast. COMPARISON:  10/19/2018 FINDINGS: Brain: There is bifrontal encephalomalacia with and anterior communicating artery aneurysm clip in unchanged position. There is no hemorrhage mass or extra-axial collection. Vascular: Mild carotid atherosclerosis. Skull: Remote left frontal craniotomy.  Sinuses/Orbits: Left-sided cochlear implant with streak artifact obscuring the adjacent brain. Paranasal sinuses are clear. Right mastoid and middle ear clear. The orbits are normal. IMPRESSION: 1. No acute intracranial abnormality. 2. Unchanged appearance of bifrontal encephalomalacia and clipping of anterior cerebral aneurysm. Electronically Signed   By: Ulyses Jarred M.D.   On: 10/25/2018 17:48      ASSESSMENT AND PLAN    -Multidisciplinary rounds held today  Acute encephalopathy -Complicated by history of hemorrhagic CVA -Initially thought to be toxic metabolic due to uremia and severe AKI -Concern for recurrent CVA -Discussed with neurologist-plan for medical management and possibly additional neuroimaging    AcuteKidney Failure - KDIGO stage 4 -Resolved Nephrology on case - appreciate input  secondary to diabetic nephropathy.  -secondary to urinary obstruction/retention.     Hypernatremia:- RESOLVED today free water deficit of 6 liters - Increase D5W infusion to 172m/hr - free water if resuming tube feeds. -continue Full MV support -continue  Bronchodilator Therapy -Wean Fio2 and PEEP as tolerated -will perform SAT/SBT when respiratory parameters are met    Septic shock  Improved - of urinary source as above  -urine culture pending - although may be innaccurate due to empiric antibiotic tx prior to collections -off vasopressor support now with improved hemodynamics     NEUROLOGY     -Neurology consultation -appreciate input Patient with left hemineglect and paralysis x4 Wake up assessment pending   -Official swallow evaluation prior to nourishment  ID -continue IV abx as prescibed -follow up cultures  GI/Nutrition GI PROPHYLAXIS as indicated DIET-->TF's as tolerated Constipation protocol as indicated  ENDO - ICU hypoglycemic\Hyperglycemia protocol -check FSBS per protocol   ELECTROLYTES -follow labs as needed -replace as needed -pharmacy consultation   DVT/GI PRX ordered -SCDs  TRANSFUSIONS AS NEEDED MONITOR FSBS ASSESS the need for LABS as needed   Critical care provider statement:  Critical care time (minutes):31 Critical care time was exclusive of: Separately billable procedures and treating other patients Critical care was necessary to treat or prevent imminent or life-threatening deterioration of the following conditions:Septic shock of urinary source, acute toxic metabolic encephalopathy, acute kidney injury stage IV, history of hemorrhagic CVA, multiple comorbid conditions Critical care was time spent personally by me on the following activities: Development of treatment plan with patient or surrogate, discussions with consultants, evaluation of patient's response to treatment, examination of patient, obtaining history from patient or surrogate, ordering and performing treatments and interventions, ordering and review of laboratory studies and re-evaluation of patient's condition. I assumed direction of critical care for this patient from another provider in  my specialty: no   This document was prepared using Dragon voice recognition software and may include unintentional dictation errors.     FOttie Glazier M.D.  Division of [facility]

## 2018-10-26 NOTE — Consult Note (Signed)
PHARMACY CONSULT NOTE - FOLLOW UP  Pharmacy Consult for Electrolyte Monitoring and Replacement   Recent Labs: Potassium (mmol/L)  Date Value  10/26/2018 3.0 (L)   Magnesium (mg/dL)  Date Value  10/26/2018 1.7   Calcium (mg/dL)  Date Value  10/26/2018 6.6 (L)   Albumin (g/dL)  Date Value  10/21/2018 2.4 (L)   Phosphorus (mg/dL)  Date Value  10/26/2018 2.4 (L)   Sodium (mmol/L)  Date Value  10/26/2018 142   Corrected Ca: ~ 7.9  Assessment: Pt is critically ill and intubated on ventilator. Admitted with sepsis and acute renal failure w/hyperkalemia. Patient extubated this afternoon, concern for refeeding.   Goal of Therapy:  Electrolytes WNL   Plan:  5/30 @ 0400 K 3.0, Phos 2.4, Mg: 1.7  Will replace with magnesium 2g IV x 1 dose, KCL 41mEq IV x 5 runs and KPhos 73mmol IV once.   Will recheck K+ this evening @ 1800.   Recheck all electrolytes w/AM labs.   Pharmacy will continue to follow and replace electrolytes as needed.   Pernell Dupre, PharmD, BCPS Clinical Pharmacist 10/26/2018 5:34 AM

## 2018-10-26 NOTE — Progress Notes (Signed)
[name] with ST had asked me to do extra mouth care on him to stimulate swallowing and mouth movement/reflexes.  Pt  Refuses all mouth care.  He clamps his lips and teeth.  I am unable to do mouthcare of any kind.  Have attempted x2

## 2018-10-26 NOTE — Progress Notes (Signed)
[facility] [place] NAME: [name]    MR#:  [id number]  DATE OF BIRTH:  1956-02-28  SUBJECTIVE:  CHIEF COMPLAINT:   Chief Complaint  Patient presents with  . Altered Mental Status   -Patient was brought in from her facility due to hypoglycemia and was intubated for airway protection.  He is extubated but has been extremely encephalopathic.  REVIEW OF SYSTEMS:  Review of Systems  Unable to perform ROS: Mental status change    DRUG ALLERGIES:   Allergies  Allergen Reactions  . Metformin Diarrhea    VITALS:  Blood pressure (!) 143/82, pulse 74, temperature 99 F (37.2 C), resp. rate 18, height 6' 0.01" (1.829 m), weight 98.3 kg, SpO2 99 %.  PHYSICAL EXAMINATION:  Physical Exam   GENERAL:  63 y.o.-year-old ill-appearing patient lying in the bed with no acute distress.  EYES: Pupils equal, round, reactive to light and accommodation. No scleral icterus. Extraocular muscles intact.  Has a right-sided gaze HEENT: Head atraumatic, normocephalic. Oropharynx and nasopharynx clear.  NECK:  Supple, no jugular venous distention. No thyroid enlargement, no tenderness.  LUNGS: Normal breath sounds bilaterally, no wheezing, rales,rhonchi or crepitation. No use of accessory muscles of respiration.  CARDIOVASCULAR: S1, S2 normal. No murmurs, rubs, or gallops.  ABDOMEN: Soft, nontender, nondistended. Bowel sounds present. No organomegaly or mass.  EXTREMITIES: No pedal edema, cyanosis, or clubbing.  NEUROLOGIC: Cranial nerves II through XII are intact.  Favoring right gaze.  Not following any commands.  Alert but not verbal.   Sensation intact. Gait not checked.  -Responding to touch, withdrawing.  Has 2+ symmetric bilateral lower extremity reflexes PSYCHIATRIC: The patient is alert but not oriented, remains nonverbal SKIN: No obvious rash, lesion, or ulcer.    LABORATORY PANEL:   CBC Recent Labs  Lab 10/26/18 0409  WBC 11.0*  HGB  9.2*  HCT 30.0*  PLT 293   ------------------------------------------------------------------------------------------------------------------  Chemistries  Recent Labs  Lab 10/21/18 0402  10/26/18 0409  NA 150*   < > 142  K 3.3*   < > 3.0*  CL 114*   < > 111  CO2 22   < > 23  GLUCOSE 185*   < > 142*  BUN 60*   < > 20  CREATININE 4.90*   < > 0.73  CALCIUM 7.8*   < > 6.6*  MG  --    < > 1.7  AST 9*  --   --   ALT 11  --   --   ALKPHOS 43  --   --   BILITOT 0.8  --   --    < > = values in this interval not displayed.   ------------------------------------------------------------------------------------------------------------------  Cardiac Enzymes No results for input(s): TROPONINI in the last 168 hours. ------------------------------------------------------------------------------------------------------------------  RADIOLOGY:  Ct Head Wo Contrast  Result Date: 10/25/2018 CLINICAL DATA:  Altered mental status EXAM: CT HEAD WITHOUT CONTRAST TECHNIQUE: Contiguous axial images were obtained from the base of the skull through the vertex without intravenous contrast. COMPARISON:  10/19/2018 FINDINGS: Brain: There is bifrontal encephalomalacia with and anterior communicating artery aneurysm clip in unchanged position. There is no hemorrhage mass or extra-axial collection. Vascular: Mild carotid atherosclerosis. Skull: Remote left frontal craniotomy. Sinuses/Orbits: Left-sided cochlear implant with streak artifact obscuring the adjacent brain. Paranasal sinuses are clear. Right mastoid and middle ear clear. The orbits are normal. IMPRESSION: 1. No acute intracranial abnormality. 2. Unchanged appearance of bifrontal encephalomalacia and clipping  of anterior cerebral aneurysm. Electronically Signed   By: Ulyses Jarred M.D.   On: 10/25/2018 17:48   Dg Chest Port 1 View  Result Date: 10/25/2018 CLINICAL DATA:  Acute respiratory failure. EXAM: PORTABLE CHEST 1 VIEW COMPARISON:  10/19/2018.  FINDINGS: Endotracheal tube noted with its tip 3 cm above the carina. NG tube noted with its tip over the stomach. Cardiac monitoring device noted over the chest. Heart size stable. Low lung volumes with basilar atelectasis. Mild bibasilar infiltrates cannot be excluded. No pleural effusion or pneumothorax. IMPRESSION: 1.  Lines and tubes stable position. 2. Low lung volumes with bibasilar atelectasis. Mild bibasilar infiltrates cannot be excluded. Electronically Signed   By: Marcello Moores  Register   On: 10/25/2018 06:12    EKG:   Orders placed or performed in visit on 04/13/17  . 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  1.  Acute encephalopathy-initially secondary to hypoglycemia, currently extubated and sugars are improved. -Has bilateral encephalomalacia based on CT head.  Prior to left frontal craniotomy done. -Has right anterior cerebral artery aneurysm embolization in the past and clipping recently. -MRI cannot be done due to recent clipping. -Appreciate neurology consult.  Provigil has been started.  2.  Sepsis-secondary to urinary tract infection.  All cultures have been negative.  Off pressors.  On IV Solu-Cortef.  -Broad-spectrum antibiotics with cefepime at this time  3.  Acute renal failure-secondary to acute urinary retention with history of BPH -Appreciate nephrology consult.  Bilateral hydronephrosis on renal ultrasound.  Status post Foley cath -Renal function is normalized at this time  4.  Hypokalemia-being replaced  5.  Seizure disorder-prophylactically on Keppra.  6.  DVT prophylaxis-subcutaneous heparin   All the records are reviewed and case discussed with Care Management/Social Workerr. Management plans  discussed with the patient, family and they are in agreement.  CODE STATUS: Full code  TOTAL TIME TAKING CARE OF THIS PATIENT: 39 minutes.   POSSIBLE D/C IN ?  DAYS, DEPENDING ON CLINICAL CONDITION.   Gladstone Lighter M.D on 10/26/2018 at 12:56 PM  Between 7am to 6pm - Pager - [phone]  After 6pm go to [email or web address] - password [email or web address] [facility]  Office  [phone]  CC: Primary care physician; Garwin Brothers, MD

## 2018-10-26 NOTE — Progress Notes (Addendum)
SLP Cancellation Note  Patient Details Name: [name] MRN: [id number] DOB: [birth year]   Cancelled treatment:       Reason Eval/Treat Not Completed: Medical issues which prohibited therapy;Patient's level of consciousness;Patient not medically ready(chart reviewed; consulted MD re: pt's status today). Pt was extubated yesterday post several days of oral intubation; he remains on [place] O2 support. He has a significant decline in Neurological presentation w/ poor attention and follow through to task/communication currently. He has significant Neurological history originating in 2017 per chart history w/ residual deficits.  Due to current presentation and h/o deficits (including Esophageal phase dysmotility), pt is at increased risk for dysphagia/aspiration w/ potential Pulmonary decline from any aspiration. Recommend continue NPO status w/ frequent oral care using lemon glycerin swabs to promote oral stimulation, awareness, and pharyngeal swallowing. NSG updated, agreed. MD agreed.      Orinda Kenner, [place], CCC-SLP Watson,Katherine 10/26/2018, 11:04 AM

## 2018-10-27 LAB — BASIC METABOLIC PANEL
Anion gap: 9 (ref 5–15)
BUN: 16 mg/dL (ref 8–23)
CO2: 22 mmol/L (ref 22–32)
Calcium: 7.6 mg/dL — ABNORMAL LOW (ref 8.9–10.3)
Chloride: 111 mmol/L (ref 98–111)
Creatinine, Ser: 0.62 mg/dL (ref 0.61–1.24)
GFR calc Af Amer: >60 mL/min (ref >60)
GFR calc non Af Amer: >60 mL/min (ref >60)
Glucose, Bld: 141 mg/dL — ABNORMAL HIGH (ref 70–99)
Potassium: 3.6 mmol/L (ref 3.5–5.1)
Sodium: 142 mmol/L (ref 135–145)

## 2018-10-27 LAB — GLUCOSE, CAPILLARY
Glucose-Capillary: 122 mg/dL — ABNORMAL HIGH (ref 70–99)
Glucose-Capillary: 121 mg/dL — ABNORMAL HIGH (ref 70–99)
Glucose-Capillary: 100 mg/dL — ABNORMAL HIGH (ref 70–99)
Glucose-Capillary: 127 mg/dL — ABNORMAL HIGH (ref 70–99)

## 2018-10-27 LAB — PHOSPHORUS: Phosphorus: 2.6 mg/dL (ref 2.5–4.6)

## 2018-10-27 LAB — MAGNESIUM: Magnesium: 1.9 mg/dL (ref 1.7–2.4)

## 2018-10-27 MED ORDER — MAGNESIUM SULFATE IN D5W 1-5 GM/100ML-% IV SOLN
1.0000 g | Freq: Once | INTRAVENOUS | Status: AC
  Administered 2018-10-27: 1 g via INTRAVENOUS
  Filled 2018-10-27: qty 100

## 2018-10-27 MED ORDER — SODIUM CHLORIDE 0.9 % IV SOLN
Freq: Once | INTRAVENOUS | Status: AC
  Administered 2018-10-27: 14:00:00 via INTRAVENOUS
  Filled 2018-10-27: qty 15

## 2018-10-27 MED ORDER — VITAL HIGH PROTEIN PO LIQD
1000.0000 mL | ORAL | Status: DC
  Administered 2018-10-28: 06:00:00 1000 mL

## 2018-10-27 MED ORDER — SODIUM CHLORIDE 0.9 % IV SOLN
500.0000 mg | Freq: Two times a day (BID) | INTRAVENOUS | Status: DC
  Administered 2018-10-27 – 2018-10-28 (×3): 500 mg via INTRAVENOUS
  Filled 2018-10-27 (×3): qty 5
  Filled 2018-10-27 (×2): qty 500

## 2018-10-27 NOTE — Progress Notes (Addendum)
Pharmacy Electrolyte Monitoring Consult:  Pharmacy consulted to assist in monitoring and replacing electrolytes in this 63 y.o. male admitted on 10/19/2018 with Altered Mental Status   Labs:  Sodium (mmol/L)  Date Value  10/27/2018 142   Potassium (mmol/L)  Date Value  10/27/2018 3.6   Magnesium (mg/dL)  Date Value  10/27/2018 1.9   Phosphorus (mg/dL)  Date Value  10/27/2018 2.6   Calcium (mg/dL)  Date Value  10/27/2018 7.6 (L)   Albumin (g/dL)  Date Value  10/21/2018 2.4 (L)   Corrected Calcium: 8.9  Assessment/Plan: Patient continues on MIVF of D5/10mEq of potassium at 88mL/hr.   Patient received ~ 65 mEq of IV potassium replacement on 5/30 outside of MIVF. Will order potassium 34mEq IV x 1.   Patient is on [place]. Will continue to replace magnesium for goal ~ 2. Will order magnesium 1g IV x 1.   Will replace for goal potassium ~4, goal magnesium ~ 2, and goal phosphorus ~ 2.5.   Will obtain electrolytes with am labs.   Pharmacy will continue to monitor and adjust per consult.    Simpson,Michael L 10/27/2018 11:23 AM

## 2018-10-27 NOTE — Progress Notes (Signed)
Nutrition Follow-up  RD working remotely.  DOCUMENTATION CODES:   Obesity unspecified  INTERVENTION:   RD will add vitamins and supplements once diet advanced.   If unable to advance diet, recommend NGT placement and tube feeds  NUTRITION DIAGNOSIS:   Inadequate oral intake related to inability to eat as evidenced by NPO status. Ongoing   GOAL:   Patient will meet greater than or equal to 90% of their needs Previously met with TF regimen.  MONITOR:   Diet advancement, Labs, Weight trends, I & O's, Skin  ASSESSMENT:   63 year old male with PMHx of HTN, HLD, depression, anxiety, hx CVA, hx drug abuse admitted with acute mental status change in setting of prior cerebral hemorrhage and CVA and requiring intubation on 5/23 for airway protection, also with urinary retention with acute renal failure.   Pt extubated 5/29; pt tolerating well. Pt being followed by SLP; pt has been unable to advance diet. RD will add supplements and vitamins once diet advanced. Per chart, pt down 44lbs since admit; pt -7.2L on I & Os. If unable to advance diet, recommend NGT placement and tube feeds.   Medications reviewed and include: heparin, solu-cortef, 5% dextrose w/ KCl '@50ml'$ /hr, pepcid  Labs reviewed: K 3.6 wnl, P 2.6 wnl, Mg 1.9 wnl Wbc- 11.0(H), Hgb 9.2(L), Hct 30.0(L)  Diet Order:   Diet Order            Diet NPO time specified  Diet effective now             EDUCATION NEEDS:   No education needs have been identified at this time  Skin:  Skin Assessment: Skin Integrity Issues:(stg II coccyx)  Last BM:  5/31- TYPE 6  Height:   Ht Readings from Last 1 Encounters:  10/23/18 6' 0.01" (1.829 m)   Weight:   Wt Readings from Last 1 Encounters:  10/27/18 100.4 kg   Ideal Body Weight:  80.9 kg  BMI:  Body mass index is 30.01 kg/m.  Estimated Nutritional Needs:   Kcal:  2300-2600kcal/day   Protein:  115-130g/day   Fluid:  2 L/day (25 mL/kg IBW)  Koleen Distance MS,  RD, LDN Pager #- [phone] Office#- [phone] After Hours Pager: [phone]

## 2018-10-27 NOTE — Progress Notes (Signed)
CRITICAL CARE NOTE        SUBJECTIVE FINDINGS & SIGNIFICANT EVENTS   Patient remains critically ill Prognosis is guarded  Patient is more awake but still unable to folllow any verbal communication.   He has been hemodynamically stable for 24hrs.    PAST MEDICAL HISTORY   Past Medical History:  Diagnosis Date  . Allergy   . Anxiety   . Cataract   . Cerebral hemorrhage ([place])   . Chest pain, atypical    12/2003:  negative cardiolyte  . Depression   . Drug abuse ([place])   . Eczema   . Erectile dysfunction   . History of meniscal tear    bilateral  . HLD (hyperlipidemia)   . HTN (hypertension)   . Hx of tear of ACL (anterior cruciate ligament)    right  . Insomnia   . Olecranon bursitis of left elbow 10/2009   s/p I&D by Dr Maxie Better, initially assessed by Dr. Nori Riis   . Prediabetes   . Stroke ([place])   . Suicidal behavior 04/13/2017   Pt states taking a bunch of sleeping pills to end life   . TIA (transient ischemic attack)      SURGICAL HISTORY   Past Surgical History:  Procedure Laterality Date  . EP IMPLANTABLE DEVICE N/A 12/30/2015   Procedure: Loop Recorder Insertion;  Surgeon: Thompson Grayer, MD;  Location: [place] [place] [facility];  Service: Cardiovascular;  Laterality: N/A;  . ESOPHAGOGASTRODUODENOSCOPY N/A 10/03/2016   Procedure: ESOPHAGOGASTRODUODENOSCOPY (EGD);  Surgeon: Irene Shipper, MD;  Location: [facility] END[facility];  Service: Endoscopy;  Laterality: N/A;  . INCISE AND DRAIN ABCESS     L elbow due to cellulitis/bursitis  . INNER EAR SURGERY    . IR GENERIC HISTORICAL  12/28/2015   IR ANGIO VERTEBRAL SEL VERTEBRAL UNI L MOD SED 12/28/2015 Luanne Bras, MD [facility]  . IR GENERIC HISTORICAL  12/28/2015   IR ANGIO VERTEBRAL SEL SUBCLAVIAN INNOMINATE UNI R MOD SED 12/28/2015 Luanne Bras, MD [facility]  . IR GENERIC HISTORICAL  12/28/2015   IR ANGIO INTRA EXTRACRAN SEL INTERNAL CAROTID BILAT MOD SED 12/28/2015 Luanne Bras, MD [facility]  . KNEE ARTHROSCOPY    . RADIOLOGY WITH ANESTHESIA N/A 09/20/2015   Procedure: EMBOLIZATION         (RADIOLOGY WITH ANESTHESIA);  Surgeon: Luanne Bras, MD;  Location: [place];  Service: Radiology;  Laterality: N/A;  . SKIN TAG REMOVAL     11 removed  . TEE WITHOUT CARDIOVERSION N/A 12/30/2015   Procedure: TRANSESOPHAGEAL ECHOCARDIOGRAM (TEE);  Surgeon: Larey Dresser, MD;  Location: [facility] ENDOSCOPY;  Service: Cardiovascular;  Laterality: N/A;     FAMILY HISTORY   Family History  Problem Relation Age of Onset  . Stroke Mother   . Hypertension Mother   . Aneurysm Mother 32       Died of brain aneursym  . Heart failure Father   . Emphysema Father   . Diabetes Mellitus II Sister   . Colon cancer Neg Hx   . Rectal cancer Neg Hx   . Stomach cancer Neg Hx      SOCIAL HISTORY   Social History   Tobacco Use  . Smoking status: Never Smoker  . Smokeless tobacco: Never Used  Substance Use Topics  . Alcohol use: No    Alcohol/week: 0.0 standard drinks  . Drug use: No     MEDICATIONS   Current Medication:  Current Facility-Administered Medications:  .  chlorhexidine ([place])  0.12 % solution 15 mL, 15 mL, Mouth Rinse, BID, Jamilex Bohnsack, MD, 15 mL at 10/26/18 2105 .  Chlorhexidine Gluconate Cloth 2 % PADS 6 each, 6 each, Topical, Q0600, Tyler Pita, MD, 6 each at 10/27/18 [phone] .  dextrose 5 % 1,000 mL with potassium chloride 40 mEq infusion, , Intravenous, Continuous, Kolluru, Sarath, MD, Last Rate: 50 mL/hr at 10/27/18 0300 .  famotidine (PEPCID) IVPB 20 mg premix, 20 mg, Intravenous, BID, [place], Shona Needles, MD, Stopped at 10/26/18 2337 .  heparin injection 5,000 Units, 5,000 Units, Subcutaneous, Q8H, Fritzi Mandes, MD, 5,000 Units at 10/27/18 0535 .  hydrocortisone sodium succinate (SOLU-CORTEF) 100 MG injection 50 mg, 50 mg,  Intravenous, Q8H, Tyler Pita, MD, 50 mg at 10/27/18 0115 .  levETIRAcetam (KEPPRA) IVPB 500 mg/100 mL premix, 500 mg, Intravenous, Q12H, Tyler Pita, MD, Stopped at 10/26/18 2130 .  MEDLINE mouth rinse, 15 mL, Mouth Rinse, q12n4p, Monque Haggar, MD, 15 mL at 10/25/18 1620 .  modafinil (PROVIGIL) tablet 100 mg, 100 mg, Oral, BID, Leotis Pain, MD .  polyethylene glycol (MIRALAX / GLYCOLAX) packet 17 g, 17 g, Oral, Daily PRN, Fritzi Mandes, MD    ALLERGIES   Metformin    REVIEW OF SYSTEMS    Unable to obtain due to encephalopathy  PHYSICAL EXAMINATION   Vitals:   10/27/18 0600 10/27/18 0700  BP: 130/67 122/71  Pulse: 67 70  Resp: 20 19  Temp: 99 F (37.2 C) 99 F (37.2 C)  SpO2: 99% 98%    GENERAL:encephalopathy HEAD: Normocephalic, atraumatic.  EYES: Pupils equal, round, reactive to light.  No scleral icterus.  MOUTH: Moist mucosal membrane. NECK: Supple. No thyromegaly. No nodules. No JVD.  PULMONARY: ctab CARDIOVASCULAR: S1 and S2. Regular rate and rhythm. No murmurs, rubs, or gallops.  GASTROINTESTINAL: Soft, nontender, non-distended. No masses. Positive bowel sounds. No hepatosplenomegaly.  MUSCULOSKELETAL: No swelling, clubbing, or edema.  NEUROLOGIC: Mild distress due to acute illness SKIN:intact,warm,dry   LABS AND IMAGING  LAB RESULTS: Recent Labs  Lab 10/25/18 0302  10/26/18 0409 10/26/18 1811 10/27/18 0522  NA 144  --  142  --  142  K 2.8*   < > 3.0* 3.7 3.6  CL 112*  --  111  --  111  CO2 24  --  23  --  22  BUN 34*  --  20  --  16  CREATININE 0.87  --  0.73  --  0.62  GLUCOSE 153*  --  142*  --  141*   < > = values in this interval not displayed.   Recent Labs  Lab 10/24/18 0242 10/25/18 0302 10/26/18 0409  HGB 8.2* 8.6* 9.2*  HCT 26.7* 27.6* 30.0*  WBC 10.1 10.1 11.0*  PLT 282 287 293     IMAGING RESULTS: No results found.    ASSESSMENT AND PLAN     -Multidisciplinary rounds held today  Acute  encephalopathy -Complicated by history of hemorrhagic CVA -Initially thought to be toxic metabolic due to uremia and severe AKI -Concern forrecurrent CVA -Discussed with neurologist-plan for medical management and possibly additional neuroimaging -patient has been stable for 24h will optimize for downgrade to medical floor.    AcuteKidney Failure - KDIGO stage 4 -Resolved Nephrology on case - appreciate input secondary to diabetic nephropathy.  -secondary to urinary obstruction/retention.     Hypernatremia:- RESOLVED today free water deficit of 6 liters - Increase D5W infusion to 129m/hr - free water if resuming tube feeds. -continue  Full MV support -continue Bronchodilator Therapy -Wean Fio2 and PEEP as tolerated -will perform SAT/SBT when respiratory parameters are met    Septic shock Improved - of urinary source as above  -urine culture pending - although may be innaccurate due to empiric antibiotic tx prior to collections -off vasopressor support now with improved hemodynamics     NEUROLOGY -Neurology consultation -appreciate input Patient with left hemineglect andparalysis x4 Wake up assessment pending  -Official swallow evaluation prior to nourishment  ID -continue IV abx as prescibed -follow up cultures  GI/Nutrition GI PROPHYLAXIS as indicated DIET-->TF's as tolerated Constipation protocol as indicated  ENDO - ICU hypoglycemic\Hyperglycemia protocol -check FSBS per protocol   ELECTROLYTES -follow labs as needed -replace as needed -pharmacy consultation   DVT/GI PRX ordered -SCDs  TRANSFUSIONS AS NEEDED MONITOR FSBS ASSESS the need for LABS as needed   Critical care provider statement:  Critical care time (minutes):32 Critical care time was exclusive of: Separately billable procedures and treating other patients Critical care was necessary to treat or prevent imminent or life-threatening  deterioration of the following conditions:Septic shock of urinary source, acute toxic metabolic encephalopathy, acute kidney injury stage IV, history of hemorrhagic CVA, multiple comorbid conditions Critical care was time spent personally by me on the following activities: Development of treatment plan with patient or surrogate, discussions with consultants, evaluation of patient's response to treatment, examination of patient, obtaining history from patient or surrogate, ordering and performing treatments and interventions, ordering and review of laboratory studies and re-evaluation of patient's condition. I assumed direction of critical care for this patient from another provider in my specialty: no   This document was prepared using Dragon voice recognition software and may include unintentional dictation errors.    Ottie Glazier, M.D.  Division of [place]

## 2018-10-27 NOTE — Consult Note (Signed)
PHARMACY CONSULT NOTE - FOLLOW UP  Pharmacy Consult for Electrolyte Monitoring and Replacement   Recent Labs: Potassium (mmol/L)  Date Value  10/27/2018 3.6   Magnesium (mg/dL)  Date Value  10/27/2018 1.9   Calcium (mg/dL)  Date Value  10/27/2018 7.6 (L)   Albumin (g/dL)  Date Value  10/21/2018 2.4 (L)   Phosphorus (mg/dL)  Date Value  10/27/2018 2.6   Sodium (mmol/L)  Date Value  10/27/2018 142   Corrected Ca: ~ 8.88  Assessment: Pt is critically ill and intubated on ventilator. Admitted with sepsis and acute renal failure w/hyperkalemia. Patient extubated 5/29 and there is concern for refeeding.   Goal of Therapy:  Electrolytes WNL   Plan:  5/31 Phos: 2.6, Mg: 1.9, K: 3.6  No replacement need at this time.   Recheck all electrolytes w/AM labs.   Pharmacy will continue to follow and replace electrolytes as needed.   Pernell Dupre, PharmD, BCPS Clinical Pharmacist 10/27/2018 6:35 AM

## 2018-10-27 NOTE — Progress Notes (Signed)
[place] at [place] NAME: [name]    MR#:  [id number]  DATE OF BIRTH:  23-Apr-1956  SUBJECTIVE:  CHIEF COMPLAINT:   Chief Complaint  Patient presents with  . Altered Mental Status   -Patient slightly more alert today, still not very communicative at this point.  REVIEW OF SYSTEMS:  Review of Systems  Unable to perform ROS: Mental status change    DRUG ALLERGIES:   Allergies  Allergen Reactions  . Metformin Diarrhea    VITALS:  Blood pressure 122/71, pulse 70, temperature 99 F (37.2 C), temperature source Bladder, resp. rate 19, height 6' 0.01" (1.829 m), weight 100.4 kg, SpO2 98 %.  PHYSICAL EXAMINATION:  Physical Exam   GENERAL:  63 y.o.-year-old ill-appearing patient lying in the bed with no acute distress.  EYES: Pupils equal, round, reactive to light and accommodation. No scleral icterus. Extraocular muscles intact.  Has a right-sided gaze HEENT: Head atraumatic, normocephalic. Oropharynx and nasopharynx clear.  NECK:  Supple, no jugular venous distention. No thyroid enlargement, no tenderness.  LUNGS: Normal breath sounds bilaterally, no wheezing, rales,rhonchi or crepitation. No use of accessory muscles of respiration.  CARDIOVASCULAR: S1, S2 normal. No murmurs, rubs, or gallops.  ABDOMEN: Soft, nontender, nondistended. Bowel sounds present. No organomegaly or mass.  EXTREMITIES: No pedal edema, cyanosis, or clubbing.  NEUROLOGIC: Cranial nerves II through XII are intact.  Tracking objects today.  Not following any commands.  Alert but not verbal.   Sensation intact. Gait not checked.  -Responding to touch, withdrawing.  Has 2+ symmetric bilateral lower extremity reflexes PSYCHIATRIC: The patient is alert but not oriented, remains nonverbal SKIN: No obvious rash, lesion, or ulcer.    LABORATORY PANEL:   CBC Recent Labs  Lab 10/26/18 0409  WBC 11.0*  HGB 9.2*  HCT 30.0*  PLT 293    ------------------------------------------------------------------------------------------------------------------  Chemistries  Recent Labs  Lab 10/21/18 0402  10/27/18 0522  NA 150*   < > 142  K 3.3*   < > 3.6  CL 114*   < > 111  CO2 22   < > 22  GLUCOSE 185*   < > 141*  BUN 60*   < > 16  CREATININE 4.90*   < > 0.62  CALCIUM 7.8*   < > 7.6*  MG  --    < > 1.9  AST 9*  --   --   ALT 11  --   --   ALKPHOS 43  --   --   BILITOT 0.8  --   --    < > = values in this interval not displayed.   ------------------------------------------------------------------------------------------------------------------  Cardiac Enzymes No results for input(s): TROPONINI in the last 168 hours. ------------------------------------------------------------------------------------------------------------------  RADIOLOGY:  Ct Head Wo Contrast  Result Date: 10/25/2018 CLINICAL DATA:  Altered mental status EXAM: CT HEAD WITHOUT CONTRAST TECHNIQUE: Contiguous axial images were obtained from the base of the skull through the vertex without intravenous contrast. COMPARISON:  10/19/2018 FINDINGS: Brain: There is bifrontal encephalomalacia with and anterior communicating artery aneurysm clip in unchanged position. There is no hemorrhage mass or extra-axial collection. Vascular: Mild carotid atherosclerosis. Skull: Remote left frontal craniotomy. Sinuses/Orbits: Left-sided cochlear implant with streak artifact obscuring the adjacent brain. Paranasal sinuses are clear. Right mastoid and middle ear clear. The orbits are normal. IMPRESSION: 1. No acute intracranial abnormality. 2. Unchanged appearance of bifrontal encephalomalacia and clipping of anterior cerebral aneurysm. Electronically Signed   By: Lennette Bihari  [place] M.D.   On: 10/25/2018 17:48    EKG:   Orders placed or performed in visit on 04/13/17  . 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  1.  Acute encephalopathy-initially secondary to hypoglycemia, currently extubated and sugars are improved. -Has bilateral encephalomalacia based on CT head.  Prior to left frontal craniotomy done. -Has right anterior cerebral artery aneurysm embolization in the past and clipping recently this year. -MRI cannot be done due to recent clipping. -Appreciate neurology consult.  Provigil has been started.  2.  Sepsis-secondary to urinary tract infection.  All cultures have been negative.  Off pressors.  On IV Solu-Cortef.  -Broad-spectrum antibiotics with cefepime at this time  3.  Acute renal failure-secondary to acute urinary retention with history of BPH -Appreciate nephrology consult.  Bilateral hydronephrosis on renal ultrasound.  Status post Foley cath -Renal function is normalized at this time  4.  Hypokalemia- replaced  5.  Seizure disorder-prophylactically on Keppra.  6.  DVT prophylaxis-subcutaneous heparin  Updated daughter over the phone today   All the records are reviewed and case discussed with Care Management/Social Workerr. Management plans discussed with the patient, family and they are in agreement.  CODE STATUS: Full code  TOTAL TIME TAKING CARE OF THIS PATIENT: 37 minutes.   POSSIBLE D/C IN ?  DAYS, DEPENDING ON CLINICAL CONDITION.   Gladstone Lighter M.D on 10/27/2018 at 1:17 PM  Between 7am to 6pm - Pager - [phone]  After 6pm go to [email or web address] - password [email or web address] [facility]  Office  [phone]  CC: Primary care physician; Garwin Brothers, MD

## 2018-10-28 ENCOUNTER — Inpatient Hospital Stay: Payer: Medicare HMO

## 2018-10-28 DIAGNOSIS — G9341 Metabolic encephalopathy: Secondary | ICD-10-CM

## 2018-10-28 LAB — BASIC METABOLIC PANEL
Anion gap: 9 (ref 5–15)
BUN: 13 mg/dL (ref 8–23)
CO2: 22 mmol/L (ref 22–32)
Calcium: 8.1 mg/dL — ABNORMAL LOW (ref 8.9–10.3)
Chloride: 112 mmol/L — ABNORMAL HIGH (ref 98–111)
Creatinine, Ser: 0.75 mg/dL (ref 0.61–1.24)
GFR calc Af Amer: >60 mL/min (ref >60)
GFR calc non Af Amer: >60 mL/min (ref >60)
Glucose, Bld: 113 mg/dL — ABNORMAL HIGH (ref 70–99)
Potassium: 3.3 mmol/L — ABNORMAL LOW (ref 3.5–5.1)
Sodium: 143 mmol/L (ref 135–145)

## 2018-10-28 LAB — GLUCOSE, CAPILLARY
Glucose-Capillary: 102 mg/dL — ABNORMAL HIGH (ref 70–99)
Glucose-Capillary: 117 mg/dL — ABNORMAL HIGH (ref 70–99)
Glucose-Capillary: 117 mg/dL — ABNORMAL HIGH (ref 70–99)
Glucose-Capillary: 119 mg/dL — ABNORMAL HIGH (ref 70–99)
Glucose-Capillary: 124 mg/dL — ABNORMAL HIGH (ref 70–99)
Glucose-Capillary: 141 mg/dL — ABNORMAL HIGH (ref 70–99)
Glucose-Capillary: 93 mg/dL (ref 70–99)

## 2018-10-28 LAB — FERRITIN: Ferritin: 234 ng/mL (ref 24–336)

## 2018-10-28 LAB — FOLATE: Folate: 10.4 ng/mL (ref >5.9)

## 2018-10-28 LAB — IRON AND TIBC
Iron: 50 ug/dL (ref 45–182)
Saturation Ratios: 23 % (ref 17.9–39.5)
TIBC: 217 ug/dL — ABNORMAL LOW (ref 250–450)
UIBC: 167 ug/dL

## 2018-10-28 LAB — PHOSPHORUS: Phosphorus: 2.5 mg/dL (ref 2.5–4.6)

## 2018-10-28 LAB — MAGNESIUM: Magnesium: 1.8 mg/dL (ref 1.7–2.4)

## 2018-10-28 LAB — VITAMIN B12: Vitamin B-12: 1139 pg/mL — ABNORMAL HIGH (ref 180–914)

## 2018-10-28 MED ORDER — PRO-STAT SUGAR FREE PO LIQD
30.0000 mL | Freq: Two times a day (BID) | ORAL | Status: DC
  Administered 2018-10-28 – 2018-10-31 (×6): 30 mL

## 2018-10-28 MED ORDER — JUVEN PO PACK
1.0000 | PACK | Freq: Two times a day (BID) | ORAL | Status: DC
  Administered 2018-10-29 – 2018-11-16 (×31): 1

## 2018-10-28 MED ORDER — OSMOLITE 1.5 CAL PO LIQD
1000.0000 mL | ORAL | Status: DC
  Administered 2018-10-28 – 2018-10-30 (×3): 1000 mL

## 2018-10-28 MED ORDER — CHLORHEXIDINE GLUCONATE CLOTH 2 % EX PADS
6.0000 | MEDICATED_PAD | Freq: Every day | CUTANEOUS | Status: DC
  Administered 2018-10-28 – 2018-11-09 (×13): 6 via TOPICAL

## 2018-10-28 MED ORDER — FREE WATER
30.0000 mL | Status: DC
  Administered 2018-10-28 – 2018-10-31 (×15): 30 mL

## 2018-10-28 MED ORDER — MAGNESIUM SULFATE 2 GM/50ML IV SOLN
2.0000 g | Freq: Once | INTRAVENOUS | Status: AC
  Administered 2018-10-28: 2 g via INTRAVENOUS
  Filled 2018-10-28: qty 50

## 2018-10-28 MED ORDER — POTASSIUM CHLORIDE 20 MEQ PO PACK
40.0000 meq | PACK | ORAL | Status: AC
  Administered 2018-10-28 (×2): 40 meq
  Filled 2018-10-28 (×2): qty 2

## 2018-10-28 MED ORDER — HYDROCORTISONE NA SUCCINATE PF 100 MG IJ SOLR: 50.0000 mg | Freq: Every day | INTRAMUSCULAR | Status: DC

## 2018-10-28 NOTE — Progress Notes (Addendum)
Nutrition Follow-up  DOCUMENTATION CODES:   Obesity unspecified  INTERVENTION:  Initiate Osmolite 1.5 Cal at 20 mL/hr and advance by 20 mL/hr every 8 hours to goal rate of 60 mL/hr. Also provide Pro-Stat 30 mL BID per tube. Goal regimen provides 2360 kcal, 120 grams of protein, 1094 mL H2O daily.  Goal regimen meets 100% RDIs for vitamins/minerals.  Provide minimum free water flush of 30 mL Q4hrs to maintain tube patency.  Also provide Juven BID per tube to promote would healing.  NUTRITION DIAGNOSIS:   Inadequate oral intake related to inability to eat as evidenced by NPO status.  Ongoing.  GOAL:   Patient will meet greater than or equal to 90% of their needs  Not met.  MONITOR:   Diet advancement, Labs, Weight trends, I & O's, Skin  REASON FOR ASSESSMENT:   Ventilator    ASSESSMENT:   63 year old male with PMHx of HTN, HLD, depression, anxiety, hx CVA, hx drug abuse admitted with acute mental status change in setting of prior cerebral hemorrhage and CVA and requiring intubation on 5/23 for airway protection, also with urinary retention with acute renal failure.  Met with patient in room. Also discussed with RN and on rounds. Patient still not appropriate for PO intake as he cannot follow commands. NGT was placed for initiation of tube feeds. Plan is to consult GI to assess for PEG tube placement.  Enteral Access: 16 Fr. NGT placed 5/31; terminates in gastric body per abdominal x-ray today; 65 cm at left nare  Tube Feeds: pt receiving Vital High Protein at 20 mL/hr  Medications reviewed and include: D5W at 50 mL/hr (60 grams dextrose daily, 204 kcal daily), famotidine, Keppra.  Labs reviewed: CBG 117, Potassium 3.3, Chloride 112.  Diet Order:   Diet Order            Diet NPO time specified  Diet effective now             EDUCATION NEEDS:   No education needs have been identified at this time  Skin:  Skin Assessment: Skin Integrity Issues:(stg II  coccyx)  Last BM:  10/27/2018 - medium type 6  Height:   Ht Readings from Last 1 Encounters:  10/23/18 6' 0.01" (1.829 m)   Weight:   Wt Readings from Last 1 Encounters:  10/27/18 100.4 kg   Ideal Body Weight:  80.9 kg  BMI:  Body mass index is 30.01 kg/m.  Estimated Nutritional Needs:   Kcal:  2300-2600kcal/day   Protein:  115-130g/day   Fluid:  2 L/day (25 mL/kg IBW)  Willey Blade, MS, RD, LDN Office: [phone] Pager: [phone] After Hours/Weekend Pager: [phone]

## 2018-10-28 NOTE — Progress Notes (Addendum)
[place] at [place] NAME: [name]    MR#:  [id number]  DATE OF BIRTH:  June 20, 1955  SUBJECTIVE:  CHIEF COMPLAINT:   Chief Complaint  Patient presents with  . Altered Mental Status   -Patient about the same, alert and tracking but not following commands.  NG tube placed and started tube feeds  REVIEW OF SYSTEMS:  Review of Systems  Unable to perform ROS: Mental status change    DRUG ALLERGIES:   Allergies  Allergen Reactions  . Metformin Diarrhea    VITALS:  Blood pressure (!) 166/92, pulse 84, temperature 98.4 F (36.9 C), resp. rate 17, height 6' 0.01" (1.829 m), weight 100.4 kg, SpO2 99 %.  PHYSICAL EXAMINATION:  Physical Exam   GENERAL:  63 y.o.-year-old ill-appearing patient lying in the bed with no acute distress.  EYES: Pupils equal, round, reactive to light and accommodation. No scleral icterus. Extraocular muscles intact.  Has a right-sided gaze HEENT: Head atraumatic, normocephalic. Oropharynx and nasopharynx clear.  NECK:  Supple, no jugular venous distention. No thyroid enlargement, no tenderness.  LUNGS: Normal breath sounds bilaterally, no wheezing, rales,rhonchi or crepitation. No use of accessory muscles of respiration.  CARDIOVASCULAR: S1, S2 normal. No murmurs, rubs, or gallops.  ABDOMEN: Soft, nontender, nondistended. Bowel sounds present. No organomegaly or mass.  EXTREMITIES: No pedal edema, cyanosis, or clubbing.  NEUROLOGIC: Cranial nerves II through XII are intact.  Tracking objects today.  Not following any commands.  Alert but not verbal.   Sensation intact. Gait not checked.  -Responding to touch, withdrawing.  Has 2+ symmetric bilateral lower extremity reflexes PSYCHIATRIC: The patient is alert but not oriented, remains nonverbal SKIN: No obvious rash, lesion, or ulcer.    LABORATORY PANEL:   CBC Recent Labs  Lab 10/26/18 0409  WBC 11.0*  HGB 9.2*  HCT 30.0*  PLT 293    ------------------------------------------------------------------------------------------------------------------  Chemistries  Recent Labs  Lab 10/28/18 0511  NA 143  K 3.3*  CL 112*  CO2 22  GLUCOSE 113*  BUN 13  CREATININE 0.75  CALCIUM 8.1*  MG 1.8   ------------------------------------------------------------------------------------------------------------------  Cardiac Enzymes No results for input(s): TROPONINI in the last 168 hours. ------------------------------------------------------------------------------------------------------------------  RADIOLOGY:  Dg Abd 1 View  Result Date: 10/28/2018 CLINICAL DATA:  NG tube placement EXAM: ABDOMEN - 1 VIEW COMPARISON:  10/28/2018 FINDINGS: Esophageal tube tip overlies the gastric body. Upper gas pattern is unremarkable IMPRESSION: Esophageal tube tip overlies the proximal to mid stomach. Electronically Signed   By: Donavan Foil M.D.   On: 10/28/2018 02:37   Dg Abd 1 View  Result Date: 10/28/2018 CLINICAL DATA:  NG tube placement EXAM: ABDOMEN - 1 VIEW COMPARISON:  10/24/2018 FINDINGS: NG tube tip and side port project over the upper abdomen. The side port is just beyond the gastroesophageal junction. Recommend advancing by 5 cm. Nonobstructive bowel gas pattern. IMPRESSION: NG tube side port just beyond the gastroesophageal junction. Recommend advancing by 5 cm. Electronically Signed   By: Ulyses Jarred M.D.   On: 10/28/2018 00:35    EKG:   Orders placed or performed in visit on 04/13/17  . 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  1.  Acute encephalopathy-initially secondary to hypoglycemia, currently extubated  and sugars are improved. -Has bilateral  encephalomalacia based on CT head.  Prior to left frontal craniotomy done. -Has right anterior cerebral artery aneurysm embolization in the past and clipping recently this year. -MRI cannot be done due to recent clipping. -Appreciate neurology consult.  Provigil has been started. -Consider repeat CT head if not improving -Patient remains n.p.o. due to his mental status.  Tube feeds have been started using NG tube now  2.  Sepsis-secondary to urinary tract infection.  All cultures have been negative.  Off pressors.  On IV Solu-Cortef.  -Broad-spectrum antibiotics with cefepime- finished  3.  Acute renal failure-secondary to acute urinary retention with history of BPH -Appreciate nephrology consult.  Bilateral hydronephrosis on renal ultrasound.  Status post Foley cath -Renal function is normalized at this time  4.  Hypokalemia- replaced  5.  Seizure disorder-prophylactically on Keppra.  6.  DVT prophylaxis-subcutaneous heparin  Updated daughter over the phone yesterday Will be transferred to floor today   All the records are reviewed and case discussed with Care Management/Social Workerr. Management plans discussed with the patient, family and they are in agreement.  CODE STATUS: Full code  TOTAL TIME TAKING CARE OF THIS PATIENT: 37 minutes.   POSSIBLE D/C IN ?  DAYS, DEPENDING ON CLINICAL CONDITION.   Gladstone Lighter M.D on 10/28/2018 at 10:23 AM  Between 7am to 6pm - Pager - [phone]  After 6pm go to [email or web address] - password [email or web address] [facility]  Office  [phone]  CC: Primary care physician; Garwin Brothers, MD

## 2018-10-28 NOTE — Consult Note (Signed)
Allen Darby, MD [address]  [place]  [place], [place] [address]  Main: [phone]  Fax: [phone] Pager: [phone]   Consultation  Referring Provider:     No ref. provider found Primary Care Physician:  Garwin Brothers, MD Primary Gastroenterologist:  Dr. Scarlette Shorts         Reason for Consultation:     Evaluate for PEG tube placement  Date of Admission:  10/19/2018 Date of Consultation:  10/28/2018         HPI:   [name] is a 63 y.o. 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  Patient had a prolonged hospital stay at [facility] for about 2 months, later discharged to [facility], subsequently transferred to [place] [facility].  According to patient's daughter, the G-tube was removed less than a month ago as patient was able to swallow regular solid food.  He has history of diabetes, on glipizide.  Patient is also found to be in septic shock secondary to urinary tract infection resulting in acute renal failure.  Patient had bilateral hydronephrosis secondary to urinary retention from BPH that has resulted in UTI and acute renal failure.  His renal issues have currently resolved.  Patient is just started on tube feeds today due to persistent altered mental status.  He did not have acute stroke during this admission.  GI is consulted to evaluate for PEG tube.  Patient did not undergo evaluation by speech pathology yet  NSAIDs: None  Antiplts/Anticoagulants/Anti thrombotics: None  GI Procedures: Underwent EGD and colonoscopy in the past  Past Medical History:  Diagnosis Date  . Allergy   . Anxiety   . Cataract   . Cerebral hemorrhage ([place])   . Chest pain, atypical    12/2003:  negative  cardiolyte  . Depression   . Drug abuse ([place])   . Eczema   . Erectile dysfunction   . History of meniscal tear    bilateral  . HLD (hyperlipidemia)   . HTN (hypertension)   . Hx of tear of ACL (anterior cruciate ligament)    right  . Insomnia   . Olecranon bursitis of left elbow 10/2009   s/p I&D by Dr Maxie Better, initially assessed by Dr. Nori Riis   . Prediabetes   . Stroke ([place])   . Suicidal behavior 04/13/2017   Pt states taking a bunch of sleeping pills to end life   . TIA (transient ischemic attack)     Past Surgical History:  Procedure Laterality Date  . EP IMPLANTABLE DEVICE N/A 12/30/2015   Procedure: Loop Recorder Insertion;  Surgeon: Thompson Grayer, MD;  Location: [place] [facility];  Service: Cardiovascular;  Laterality: N/A;  . ESOPHAGOGASTRODUODENOSCOPY N/A 10/03/2016   Procedure: ESOPHAGOGASTRODUODENOSCOPY (EGD);  Surgeon: Irene Shipper, MD;  Location: [facility] ENDOSCOPY;  Service: Endoscopy;  Laterality: N/A;  . INCISE AND DRAIN ABCESS     L elbow due to cellulitis/bursitis  . INNER EAR SURGERY    . IR GENERIC HISTORICAL  12/28/2015   IR ANGIO VERTEBRAL SEL VERTEBRAL UNI L MOD SED 12/28/2015 Luanne Bras, MD [facility]INTER[facility]  . IR GENERIC HISTORICAL  12/28/2015   IR ANGIO VERTEBRAL SEL SUBCLAVIAN INNOMINATE UNI R MOD SED 12/28/2015 Luanne Bras,  MD [facility]  . IR GENERIC HISTORICAL  12/28/2015   IR ANGIO INTRA EXTRACRAN SEL INTERNAL CAROTID BILAT MOD SED 12/28/2015 Luanne Bras, MD [facility]  . KNEE ARTHROSCOPY    . RADIOLOGY WITH ANESTHESIA N/A 09/20/2015   Procedure: EMBOLIZATION         (RADIOLOGY WITH ANESTHESIA);  Surgeon: Luanne Bras, MD;  Location: [place];  Service: Radiology;  Laterality: N/A;  . SKIN TAG REMOVAL     11 removed  . TEE WITHOUT CARDIOVERSION N/A 12/30/2015   Procedure: TRANSESOPHAGEAL ECHOCARDIOGRAM (TEE);  Surgeon: Larey Dresser, MD;  Location: [place];  Service: Cardiovascular;  Laterality: N/A;    Prior to Admission medications    Medication Sig Start Date End Date Taking? Authorizing Provider  albuterol (PROVENTIL) (2.5 MG/3ML) 0.083% nebulizer solution Take 2.5 mg by nebulization every 4 (four) hours as needed for wheezing or shortness of breath.   Yes [provider]  amLODipine (NORVASC) 10 MG tablet Take 1 tablet (10 mg total) by mouth daily. 12/31/15  Yes Arrien, Jimmy Picket, MD  atorvastatin (LIPITOR) 40 MG tablet Take 40 mg by mouth every evening.   Yes [provider]  carvedilol (COREG) 25 MG tablet Take 25 mg by mouth 2 (two) times daily with a meal.   Yes [provider]  doxazosin (CARDURA) 2 MG tablet Take 2 mg by mouth at bedtime.   Yes [provider]  DULoxetine (CYMBALTA) 20 MG capsule Take 1 capsule (20 mg total) by mouth daily. 05/14/17 10/19/18 Yes Eksir, Richard Miu, MD  DULoxetine (CYMBALTA) 60 MG capsule Take 1 capsule (60 mg total) by mouth daily. 05/14/17 10/19/18 Yes Eksir, Richard Miu, MD  famotidine (PEPCID) 20 MG tablet Take 20 mg by mouth 2 (two) times daily.   Yes [provider]  gabapentin (NEURONTIN) 300 MG/6ML solution Take 300 mg by mouth 3 (three) times daily.   Yes [provider]  glipiZIDE (GLUCOTROL) 5 MG tablet Take 5 mg by mouth 2 (two) times daily.   Yes [provider]  levETIRAcetam (KEPPRA) 750 MG tablet Take 750 mg by mouth 2 (two) times daily.   Yes [provider]  lisinopril (PRINIVIL,ZESTRIL) 40 MG tablet Take 40 mg by mouth daily. 12/28/16  Yes [provider]  Melatonin 3 MG TABS Take 3 mg by mouth at bedtime.   Yes [provider]    Current Facility-Administered Medications:  .  chlorhexidine (PERIDEX) 0.12 % solution 15 mL, 15 mL, Mouth Rinse, BID, Lanney Gins, Fuad, MD, 15 mL at 10/28/18 0915 .  Chlorhexidine Gluconate Cloth 2 % PADS 6 each, 6 each, Topical, Q0600, Gladstone Lighter, MD, 6 each at 10/28/18 1342 .  dextrose 5 % 1,000 mL with potassium chloride 40 mEq  infusion, , Intravenous, Continuous, Kolluru, Sarath, MD, Last Rate: 50 mL/hr at 10/28/18 1800 .  famotidine (PEPCID) IVPB 20 mg premix, 20 mg, Intravenous, BID, Aventura, Shona Needles, MD, Stopped at 10/28/18 1143 .  feeding supplement (OSMOLITE 1.5 CAL) liquid 1,000 mL, 1,000 mL, Per Tube, Continuous, Aleskerov, Fuad, MD, Last Rate: 60 mL/hr at 10/28/18 1736, 1,000 mL at 10/28/18 1736 .  feeding supplement (PRO-STAT SUGAR FREE 64) liquid 30 mL, 30 mL, Per Tube, BID, Aleskerov, Fuad, MD .  free water 30 mL, 30 mL, Per Tube, Q4H, Aleskerov, Fuad, MD, 30 mL at 10/28/18 1739 .  heparin injection 5,000 Units, 5,000 Units, Subcutaneous, Q8H, Fritzi Mandes, MD, 5,000 Units at 10/28/18 1340 .  levETIRAcetam (KEPPRA) 500 mg in  sodium chloride 0.9 % 100 mL IVPB, 500 mg, Intravenous, Q12H, Gladstone Lighter, MD, Stopped at 10/28/18 [phone] .  MEDLINE mouth rinse, 15 mL, Mouth Rinse, q12n4p, Aleskerov, Fuad, MD, 15 mL at 10/27/18 1217 .  modafinil (PROVIGIL) tablet 100 mg, 100 mg, Oral, BID, Leotis Pain, MD, 100 mg at 10/28/18 0852 .  [START ON 10/29/2018] nutrition supplement (JUVEN) (JUVEN) powder packet 1 packet, 1 packet, Per Tube, BID BM, Aleskerov, Fuad, MD .  polyethylene glycol (MIRALAX / GLYCOLAX) packet 17 g, 17 g, Oral, Daily PRN, Fritzi Mandes, MD  Family History  Problem Relation Age of Onset  . Stroke Mother   . Hypertension Mother   . Aneurysm Mother 60       Died of brain aneursym  . Heart failure Father   . Emphysema Father   . Diabetes Mellitus II Sister   . Colon cancer Neg Hx   . Rectal cancer Neg Hx   . Stomach cancer Neg Hx      Social History   Tobacco Use  . Smoking status: Never Smoker  . Smokeless tobacco: Never Used  Substance Use Topics  . Alcohol use: No    Alcohol/week: 0.0 standard drinks  . Drug use: No    Allergies as of 10/19/2018 - Review Complete 10/19/2018  Allergen Reaction Noted  . Metformin Diarrhea 08/07/2016    Review of Systems:    All systems  reviewed and negative except where noted in HPI.   Physical Exam:  Vital signs in last 24 hours: Temp:  [98.4 F (36.9 C)-99.9 F (37.7 C)] 99.7 F (37.6 C) (06/01 1800) Pulse Rate:  [62-89] 84 (06/01 1700) Resp:  [14-23] 19 (06/01 1800) BP: (133-168)/(75-95) 156/86 (06/01 1800) SpO2:  [95 %-99 %] 98 % (06/01 1700) Last BM Date: 10/28/18 General: Altered, nonverbal, not following commands Head:  Normocephalic and atraumatic. Eyes:   No icterus.   Conjunctiva pink. PERRLA. Neck:  Supple; no masses or thyroidomegaly Lungs: Respirations even and unlabored. Lungs clear to auscultation bilaterally.   No wheezes, crackles, or rhonchi.  Heart:  Regular rate and rhythm;  Without murmur, clicks, rubs or gallops Abdomen:  Soft, nondistended, nontender. Normal bowel sounds. No appreciable masses or hepatomegaly.  No rebound or guarding.  Scar at the previous G-tube site, well-healed Rectal:  Not performed. Msk:  Symmetrical without gross deformities.  Extremities:  Without edema, cyanosis or clubbing. Neurologic:  Alert and oriented x0; Skin:  Intact without significant lesions or rashes.   LAB RESULTS: CBC Latest Ref Rng & Units 10/26/2018 10/25/2018 10/24/2018  WBC 4.0 - 10.5 K/uL 11.0(H) 10.1 10.1  Hemoglobin 13.0 - 17.0 g/dL 9.2(L) 8.6(L) 8.2(L)  Hematocrit 39.0 - 52.0 % 30.0(L) 27.6(L) 26.7(L)  Platelets 150 - 400 K/uL 293 287 282    BMET BMP Latest Ref Rng & Units 10/28/2018 10/27/2018 10/26/2018  Glucose 70 - 99 mg/dL 113(H) 141(H) -  BUN 8 - 23 mg/dL 13 16 -  Creatinine 0.61 - 1.24 mg/dL 0.75 0.62 -  Sodium 135 - 145 mmol/L 143 142 -  Potassium 3.5 - 5.1 mmol/L 3.3(L) 3.6 3.7  Chloride 98 - 111 mmol/L 112(H) 111 -  CO2 22 - 32 mmol/L 22 22 -  Calcium 8.9 - 10.3 mg/dL 8.1(L) 7.6(L) -    LFT Hepatic Function Latest Ref Rng & Units 10/21/2018 10/20/2018 10/19/2018  Total Protein 6.5 - 8.1 g/dL 5.9(L) 5.9(L) -  Albumin 3.5 - 5.0 g/dL 2.4(L) 2.8(L) 2.5(L)  AST 15 - 41 U/L 9(L) 9(L) -  ALT 0 - 44 U/L 11 11 -  Alk Phosphatase 38 - 126 U/L 43 50 -  Total Bilirubin 0.3 - 1.2 mg/dL 0.8 0.6 -     STUDIES: Dg Abd 1 View  Result Date: 10/28/2018 CLINICAL DATA:  NG tube placement EXAM: ABDOMEN - 1 VIEW COMPARISON:  10/28/2018 FINDINGS: Esophageal tube tip overlies the gastric body. Upper gas pattern is unremarkable IMPRESSION: Esophageal tube tip overlies the proximal to mid stomach. Electronically Signed   By: Donavan Foil M.D.   On: 10/28/2018 02:37   Dg Abd 1 View  Result Date: 10/28/2018 CLINICAL DATA:  NG tube placement EXAM: ABDOMEN - 1 VIEW COMPARISON:  10/24/2018 FINDINGS: NG tube tip and side port project over the upper abdomen. The side port is just beyond the gastroesophageal junction. Recommend advancing by 5 cm. Nonobstructive bowel gas pattern. IMPRESSION: NG tube side port just beyond the gastroesophageal junction. Recommend advancing by 5 cm. Electronically Signed   By: Ulyses Jarred M.D.   On: 10/28/2018 00:35      Impression / Plan:   [name] is a 63 y.o. male with history of hemorrhagic CVA, cerebral artery aneurysmal embolization, clipping admitted with altered mental status, urinary retention from BPH, bilateral hydronephrosis, AKI and UTI.  AKI and UTI have resolved.  Patient continues to remain altered.  He is restarted on tube feeds today and has been tolerating okay so far.  I discussed with patient's daughter in length about possibility of G-tube placement if patient continues to remain altered and unable to safely have p.o. intake.  Patient's daughter is worried about him being dependent on G-tube and having him go through the procedure again.  I recommend him to continue tube feeds for few more days to see if his mental status improves.  Recommend formal speech pathology evaluation prior to placement of G-tube Appreciate neurology input to comment on his neurologic recovery prior to placement of G-tube.  It appears that patient's current altered  mental status is most likely due to metabolic event which is now currently resolved.  He does not have new stroke, therefore expect some neurologic recovery.  Thank you for involving me in the care of this patient.  GI will follow this patient peripherally Please call GI back with questions or concerns    LOS: 9 days   Sherri Sear, MD  10/28/2018, 6:40 PM   Note: This dictation was prepared with Dragon dictation along with smaller phrase technology. Any transcriptional errors that result from this process are unintentional.

## 2018-10-28 NOTE — Plan of Care (Signed)

## 2018-10-28 NOTE — Progress Notes (Signed)
SLP Cancellation Note  Patient Details Name: [name] MRN: [id number] DOB: 02/17/1956   Cancelled treatment:       Reason Eval/Treat Not Completed: Patient not medically ready;Medical issues which prohibited therapy(reviewed chart notes; consulted MD). Per MD assessment, SLP was recommended to hold on performing BSE at this time. ST services will continue to be available for assessment when appropriate time.     Orinda Kenner, MS, CCC-SLP [facility] 10/28/2018, 2:53 PM

## 2018-10-28 NOTE — Progress Notes (Signed)
CRITICAL CARE NOTE        SUBJECTIVE FINDINGS & SIGNIFICANT EVENTS   63 year old patient with significant comorbid conditions status post CVA at nursing home came in unresponsive with most likely recurrent acute CVA.  -Mild improvement in sensorium including visual tracking and left upper extremity spontaneous movement.  -Spoke to GI regarding PEG placement, will do on nonurgent basis currently on OGT feeds  -Optimizing for downgrade to medical floor  PAST MEDICAL HISTORY   Past Medical History:  Diagnosis Date  . Allergy   . Anxiety   . Cataract   . Cerebral hemorrhage ([place])   . Chest pain, atypical    12/2003:  negative cardiolyte  . Depression   . Drug abuse ([place])   . Eczema   . Erectile dysfunction   . History of meniscal tear    bilateral  . HLD (hyperlipidemia)   . HTN (hypertension)   . Hx of tear of ACL (anterior cruciate ligament)    right  . Insomnia   . Olecranon bursitis of left elbow 10/2009   s/p I&D by Dr Maxie Better, initially assessed by Dr. Nori Riis   . Prediabetes   . Stroke ([place])   . Suicidal behavior 04/13/2017   Pt states taking a bunch of sleeping pills to end life   . TIA (transient ischemic attack)      SURGICAL HISTORY   Past Surgical History:  Procedure Laterality Date  . EP IMPLANTABLE DEVICE N/A 12/30/2015   Procedure: Loop Recorder Insertion;  Surgeon: Thompson Grayer, MD;  Location: [place] [facility];  Service: Cardiovascular;  Laterality: N/A;  . ESOPHAGOGASTRODUODENOSCOPY N/A 10/03/2016   Procedure: ESOPHAGOGASTRODUODENOSCOPY (EGD);  Surgeon: Irene Shipper, MD;  Location: [facility] ENDOSCOPY;  Service: Endoscopy;  Laterality: N/A;  . INCISE AND DRAIN ABCESS     L elbow due to cellulitis/bursitis  . INNER EAR SURGERY    . IR GENERIC HISTORICAL  12/28/2015   IR ANGIO VERTEBRAL SEL  VERTEBRAL UNI L MOD SED 12/28/2015 Luanne Bras, MD [facility]  . IR GENERIC HISTORICAL  12/28/2015   IR ANGIO VERTEBRAL SEL SUBCLAVIAN INNOMINATE UNI R MOD SED 12/28/2015 Luanne Bras, MD [facility]  . IR GENERIC HISTORICAL  12/28/2015   IR ANGIO INTRA EXTRACRAN SEL INTERNAL CAROTID BILAT MOD SED 12/28/2015 Luanne Bras, MD [facility]  . KNEE ARTHROSCOPY    . RADIOLOGY WITH ANESTHESIA N/A 09/20/2015   Procedure: EMBOLIZATION         (RADIOLOGY WITH ANESTHESIA);  Surgeon: Luanne Bras, MD;  Location: [place];  Service: Radiology;  Laterality: N/A;  . SKIN TAG REMOVAL     11 removed  . TEE WITHOUT CARDIOVERSION N/A 12/30/2015   Procedure: TRANSESOPHAGEAL ECHOCARDIOGRAM (TEE);  Surgeon: Larey Dresser, MD;  Location: [facility] ENDOSCOPY;  Service: Cardiovascular;  Laterality: N/A;     FAMILY HISTORY   Family History  Problem Relation Age of Onset  . Stroke Mother   . Hypertension Mother   . Aneurysm Mother 79       Died of brain aneursym  . Heart failure Father   . Emphysema Father   . Diabetes Mellitus II Sister   . Colon cancer Neg Hx   . Rectal cancer Neg Hx   . Stomach cancer Neg Hx      SOCIAL HISTORY   Social History   Tobacco Use  . Smoking status: Never Smoker  . Smokeless tobacco: Never Used  Substance Use Topics  . Alcohol use: No  Alcohol/week: 0.0 standard drinks  . Drug use: No     MEDICATIONS   Current Medication:  Current Facility-Administered Medications:  .  chlorhexidine (PERIDEX) 0.12 % solution 15 mL, 15 mL, Mouth Rinse, BID, Lanney Gins, Cedarius Kersh, MD, 15 mL at 10/27/18 2300 .  Chlorhexidine Gluconate Cloth 2 % PADS 6 each, 6 each, Topical, Q0600, Tyler Pita, MD, 6 each at 10/27/18 [phone] .  dextrose 5 % 1,000 mL with potassium chloride 40 mEq infusion, , Intravenous, Continuous, Kolluru, Sarath, MD, Last Rate: 50 mL/hr at 10/27/18 1501 .  famotidine (PEPCID) IVPB 20 mg premix, 20 mg, Intravenous, BID, Aventura, Emily T, MD, Last Rate:  100 mL/hr at 10/27/18 2259, 20 mg at 10/27/18 2259 .  feeding supplement (VITAL HIGH PROTEIN) liquid 1,000 mL, 1,000 mL, Per Tube, Q24H, Tukov-Yual, Magdalene S, NP, 1,000 mL at 10/28/18 0554 .  heparin injection 5,000 Units, 5,000 Units, Subcutaneous, Q8H, Fritzi Mandes, MD, 5,000 Units at 10/28/18 0712 .  hydrocortisone sodium succinate (SOLU-CORTEF) 100 MG injection 50 mg, 50 mg, Intravenous, Q8H, Tyler Pita, MD, 50 mg at 10/28/18 0345 .  levETIRAcetam (KEPPRA) 500 mg in sodium chloride 0.9 % 100 mL IVPB, 500 mg, Intravenous, Q12H, Kalisetti, Radhika, MD, Last Rate: 400 mL/hr at 10/27/18 2302, 500 mg at 10/27/18 2302 .  MEDLINE mouth rinse, 15 mL, Mouth Rinse, q12n4p, Kayhan Boardley, MD, 15 mL at 10/27/18 1217 .  modafinil (PROVIGIL) tablet 100 mg, 100 mg, Oral, BID, Leotis Pain, MD .  polyethylene glycol (MIRALAX / GLYCOLAX) packet 17 g, 17 g, Oral, Daily PRN, Fritzi Mandes, MD    ALLERGIES   Metformin    REVIEW OF SYSTEMS     Unable to obtain due to nonverbal mental status  PHYSICAL EXAMINATION   Vitals:   10/28/18 0000 10/28/18 0300  BP: (!) 152/89 (!) 148/75  Pulse: 87 81  Resp: (!) 22 (!) 22  Temp: 99.5 F (37.5 C) 98.6 F (37 C)  SpO2: 97% 97%    GENERAL: Nonverbal status  HEAD: Normocephalic, atraumatic.  EYES: Pupils equal, round, reactive to light.  No scleral icterus.  MOUTH: Moist mucosal membrane. NECK: Supple. No thyromegaly. No nodules. No JVD.  PULMONARY: Mild bibasilar crackles CARDIOVASCULAR: S1 and S2. Regular rate and rhythm. No murmurs, rubs, or gallops.  GASTROINTESTINAL: Soft, nontender, non-distended. No masses. Positive bowel sounds. No hepatosplenomegaly.  MUSCULOSKELETAL: No swelling, clubbing, or edema.  NEUROLOGIC: GCS 8 SKIN:intact,warm,dry   LABS AND IMAGING     LAB RESULTS: Recent Labs  Lab 10/26/18 0409 10/26/18 1811 10/27/18 0522 10/28/18 0511  NA 142  --  142 143  K 3.0* 3.7 3.6 3.3*  CL 111  --  111 112*   CO2 23  --  22 22  BUN 20  --  16 13  CREATININE 0.73  --  0.62 0.75  GLUCOSE 142*  --  141* 113*   Recent Labs  Lab 10/24/18 0242 10/25/18 0302 10/26/18 0409  HGB 8.2* 8.6* 9.2*  HCT 26.7* 27.6* 30.0*  WBC 10.1 10.1 11.0*  PLT 282 287 293     IMAGING RESULTS: Dg Abd 1 View  Result Date: 10/28/2018 CLINICAL DATA:  NG tube placement EXAM: ABDOMEN - 1 VIEW COMPARISON:  10/28/2018 FINDINGS: Esophageal tube tip overlies the gastric body. Upper gas pattern is unremarkable IMPRESSION: Esophageal tube tip overlies the proximal to mid stomach. Electronically Signed   By: Donavan Foil M.D.   On: 10/28/2018 02:37   Dg Abd 1 View  Result Date:  10/28/2018 CLINICAL DATA:  NG tube placement EXAM: ABDOMEN - 1 VIEW COMPARISON:  10/24/2018 FINDINGS: NG tube tip and side port project over the upper abdomen. The side port is just beyond the gastroesophageal junction. Recommend advancing by 5 cm. Nonobstructive bowel gas pattern. IMPRESSION: NG tube side port just beyond the gastroesophageal junction. Recommend advancing by 5 cm. Electronically Signed   By: Ulyses Jarred M.D.   On: 10/28/2018 00:35      ASSESSMENT AND PLAN     -Multidisciplinary rounds held today  Acute encephalopathy -Complicated by history of hemorrhagic CVA -Initially thought to be toxic metabolic due to uremia and severe AKI -Concern forrecurrent CVA -Discussed with neurologist-plan for medical management and possibly additional neuroimaging -patient has been stable for 24h will optimize for downgrade to medical floor.    AcuteKidney Failure - KDIGO stage 4 -Resolved Nephrology on case - appreciate input secondary to diabetic nephropathy.  -secondary to urinary obstruction/retention.     Hypernatremia:- RESOLVED today free water deficit of 6 liters - Increase D5W infusion to 152m/hr - free water if resuming tube feeds. -continue Full MV support -continue Bronchodilator Therapy -Wean Fio2 and  PEEP as tolerated -will perform SAT/SBT when respiratory parameters are met    Septic shock Improved - of urinary source as above  -urine culture pending - although may be innaccurate due to empiric antibiotic tx prior to collections -off vasopressor support now with improved hemodynamics     NEUROLOGY -Neurology consultation-appreciate input Patient with left hemineglect andparalysis x4 Wake up assessment pending  -Official swallow evaluation prior to nourishment  ID -continue IV abx as prescibed -follow up cultures  GI/Nutrition GI PROPHYLAXIS as indicated DIET-->TF's as tolerated Constipation protocol as indicated  ENDO - ICU hypoglycemic\Hyperglycemia protocol -check FSBS per protocol   ELECTROLYTES -follow labs as needed -replace as needed -pharmacy consultation   DVT/GI PRX ordered -SCDs  TRANSFUSIONS AS NEEDED MONITOR FSBS ASSESS the need for LABS as needed   Critical care provider statement:  Critical care time (minutes):32 Critical care time was exclusive of: Separately billable procedures and treating other patients Critical care was necessary to treat or prevent imminent or life-threatening deterioration of the following conditions:Septic shock of urinary source, acute toxic metabolic encephalopathy, acute kidney injury stage IV, history of hemorrhagic CVA, multiple comorbid conditions Critical care was time spent personally by me on the following activities: Development of treatment plan with patient or surrogate, discussions with consultants, evaluation of patient's response to treatment, examination of patient, obtaining history from patient or surrogate, ordering and performing treatments and interventions, ordering and review of laboratory studies and re-evaluation of patient's condition. I assumed direction of critical care for this patient from another provider in my specialty: no   This document was  prepared using Dragon voice recognition software and may include unintentional dictation errors.   FOttie Glazier M.D.  Division of [facility]

## 2018-10-28 NOTE — Progress Notes (Signed)
Pharmacy Electrolyte Monitoring Consult:  Pharmacy consulted to assist in monitoring and replacing electrolytes in this 63 y.o. male admitted on 10/19/2018 with Altered Mental Status   Labs:  Sodium (mmol/L)  Date Value  10/28/2018 143   Potassium (mmol/L)  Date Value  10/28/2018 3.3 (L)   Magnesium (mg/dL)  Date Value  10/28/2018 1.8   Phosphorus (mg/dL)  Date Value  10/28/2018 2.5   Calcium (mg/dL)  Date Value  10/28/2018 8.1 (L)   Albumin (g/dL)  Date Value  10/21/2018 2.4 (L)   Corrected Calcium: 9.38  Assessment/Plan: Patient continues on MIVF of D5/67mEq of potassium at 64mL/hr.   Patient received 30 mEq of IV potassium yesterday  outside of MIVF: KCl 40 mEq q4h x2  Patient is on Keppra. Will continue to replace magnesium for goal ~ 2:  magnesium 2g IV x 1.   Will replace for goal potassium ~4, goal magnesium ~ 2, and goal phosphorus ~ 2.5.   Will obtain electrolytes with am labs.   Pharmacy will continue to monitor and adjust per consult.    Dallie Piles, PharmD 10/28/2018 12:03 PM

## 2018-10-29 LAB — CBC WITH DIFFERENTIAL/PLATELET
Abs Immature Granulocytes: 0.16 10*3/uL — ABNORMAL HIGH (ref 0.00–0.07)
Basophils Absolute: 0.1 10*3/uL (ref 0.0–0.1)
Basophils Relative: 1 %
Eosinophils Absolute: 0.6 10*3/uL — ABNORMAL HIGH (ref 0.0–0.5)
Eosinophils Relative: 4 %
HCT: 32 % — ABNORMAL LOW (ref 39.0–52.0)
Hemoglobin: 10.1 g/dL — ABNORMAL LOW (ref 13.0–17.0)
Immature Granulocytes: 1 %
Lymphocytes Relative: 20 %
Lymphs Abs: 2.6 10*3/uL (ref 0.7–4.0)
MCH: 26.9 pg (ref 26.0–34.0)
MCHC: 31.6 g/dL (ref 30.0–36.0)
MCV: 85.3 fL (ref 80.0–100.0)
Monocytes Absolute: 1.2 10*3/uL — ABNORMAL HIGH (ref 0.1–1.0)
Monocytes Relative: 9 %
Neutro Abs: 8.6 10*3/uL — ABNORMAL HIGH (ref 1.7–7.7)
Neutrophils Relative %: 65 %
Platelets: 356 10*3/uL (ref 150–400)
RBC: 3.75 MIL/uL — ABNORMAL LOW (ref 4.22–5.81)
RDW: 16 % — ABNORMAL HIGH (ref 11.5–15.5)
WBC: 13.2 10*3/uL — ABNORMAL HIGH (ref 4.0–10.5)
nRBC: 0 % (ref 0.0–0.2)

## 2018-10-29 LAB — BASIC METABOLIC PANEL
Anion gap: 8 (ref 5–15)
BUN: 14 mg/dL (ref 8–23)
CO2: 23 mmol/L (ref 22–32)
Calcium: 8.1 mg/dL — ABNORMAL LOW (ref 8.9–10.3)
Chloride: 109 mmol/L (ref 98–111)
Creatinine, Ser: 0.79 mg/dL (ref 0.61–1.24)
GFR calc Af Amer: >60 mL/min (ref >60)
GFR calc non Af Amer: >60 mL/min (ref >60)
Glucose, Bld: 135 mg/dL — ABNORMAL HIGH (ref 70–99)
Potassium: 3.8 mmol/L (ref 3.5–5.1)
Sodium: 140 mmol/L (ref 135–145)

## 2018-10-29 LAB — GLUCOSE, CAPILLARY
Glucose-Capillary: 121 mg/dL — ABNORMAL HIGH (ref 70–99)
Glucose-Capillary: 132 mg/dL — ABNORMAL HIGH (ref 70–99)
Glucose-Capillary: 96 mg/dL (ref 70–99)
Glucose-Capillary: 142 mg/dL — ABNORMAL HIGH (ref 70–99)
Glucose-Capillary: 125 mg/dL — ABNORMAL HIGH (ref 70–99)

## 2018-10-29 LAB — MAGNESIUM: Magnesium: 2 mg/dL (ref 1.7–2.4)

## 2018-10-29 LAB — HEMOGLOBIN A1C
Hgb A1c MFr Bld: 5.9 % — ABNORMAL HIGH (ref 4.8–5.6)
Mean Plasma Glucose: 122.63 mg/dL

## 2018-10-29 MED ORDER — POTASSIUM CHLORIDE 20 MEQ PO PACK
40.0000 meq | PACK | ORAL | Status: AC
  Administered 2018-10-29: 40 meq
  Filled 2018-10-29: qty 2

## 2018-10-29 MED ORDER — SODIUM CHLORIDE 0.9 % IV SOLN
INTRAVENOUS | Status: DC | PRN
  Administered 2018-10-29: 23:00:00 30 mL via INTRAVENOUS
  Administered 2018-11-01: 16:00:00 via INTRAVENOUS

## 2018-10-29 MED ORDER — ACETAMINOPHEN 650 MG RE SUPP
650.0000 mg | Freq: Four times a day (QID) | RECTAL | Status: DC | PRN
  Administered 2018-10-29 – 2018-11-27 (×6): 650 mg via RECTAL
  Filled 2018-10-29 (×6): qty 1

## 2018-10-29 MED ORDER — LEVETIRACETAM IN NACL 500 MG/100ML IV SOLN
500.0000 mg | Freq: Two times a day (BID) | INTRAVENOUS | Status: DC
  Administered 2018-10-29 – 2018-11-02 (×10): 500 mg via INTRAVENOUS
  Filled 2018-10-29 (×16): qty 100

## 2018-10-29 NOTE — Progress Notes (Signed)
PT Cancellation Note  Patient Details Name: JONTAVIOUS COMMONS MRN: [id number] DOB: 1955/10/15   Cancelled Treatment:    Reason Eval/Treat Not Completed: Other (comment)(Patient in process of being transferred from ICU to the floor. Will attempt again at later time/date once patient settled in on floor. )  Janna Arch, PT, DPT   10/29/2018, 10:34 AM

## 2018-10-29 NOTE — Progress Notes (Signed)
[name] NAME: [name]    MR#:  [id number]  DATE OF BIRTH:  06-28-1955  SUBJECTIVE:  CHIEF COMPLAINT:   Chief Complaint  Patient presents with  . Altered Mental Status   -Patient about the same, alert and tracking but not following commands.  NG tube placed and started tube feeds  REVIEW OF SYSTEMS:  Review of Systems  Unable to perform ROS: Mental status change    DRUG ALLERGIES:   Allergies  Allergen Reactions  . Metformin Diarrhea    VITALS:  Blood pressure (!) 144/79, pulse 89, temperature 99.3 F (37.4 C), resp. rate (!) 21, height 6' 0.01" (1.829 m), weight 100.4 kg, SpO2 96 %.  PHYSICAL EXAMINATION:  Physical Exam   GENERAL:  63 y.o.-year-old ill-appearing patient lying in the bed with no acute distress.  EYES: Pupils equal, round, reactive to light and accommodation. No scleral icterus. Extraocular muscles intact.  Has a right-sided gaze HEENT: Head atraumatic, normocephalic. Oropharynx and nasopharynx clear.  NECK:  Supple, no jugular venous distention. No thyroid enlargement, no tenderness.  LUNGS: Normal breath sounds bilaterally, no wheezing, rales,rhonchi or crepitation. No use of accessory muscles of respiration.  CARDIOVASCULAR: S1, S2 normal. No murmurs, rubs, or gallops.  ABDOMEN: Soft, nontender, nondistended. Bowel sounds present. No organomegaly or mass.  EXTREMITIES: No pedal edema, cyanosis, or clubbing.  NEUROLOGIC: Cranial nerves II through XII are intact.  Tracking objects today.  Not following any commands.  Alert but not verbal.   Sensation intact. Gait not checked.  -Responding to touch, withdrawing.  Has 2+ symmetric bilateral lower extremity reflexes PSYCHIATRIC: The patient is alert but not oriented, remains nonverbal SKIN: No obvious rash, lesion, or ulcer.    LABORATORY PANEL:   CBC Recent Labs  Lab 10/29/18 0250  WBC 13.2*  HGB 10.1*  HCT 32.0*  PLT 356    ------------------------------------------------------------------------------------------------------------------  Chemistries  Recent Labs  Lab 10/29/18 0250  NA 140  K 3.8  CL 109  CO2 23  GLUCOSE 135*  BUN 14  CREATININE 0.79  CALCIUM 8.1*  MG 2.0   ------------------------------------------------------------------------------------------------------------------  Cardiac Enzymes No results for input(s): TROPONINI in the last 168 hours. ------------------------------------------------------------------------------------------------------------------  RADIOLOGY:  Dg Abd 1 View  Result Date: 10/28/2018 CLINICAL DATA:  NG tube placement EXAM: ABDOMEN - 1 VIEW COMPARISON:  10/28/2018 FINDINGS: Esophageal tube tip overlies the gastric body. Upper gas pattern is unremarkable IMPRESSION: Esophageal tube tip overlies the proximal to mid stomach. Electronically Signed   By: Donavan Foil M.D.   On: 10/28/2018 02:37   Dg Abd 1 View  Result Date: 10/28/2018 CLINICAL DATA:  NG tube placement EXAM: ABDOMEN - 1 VIEW COMPARISON:  10/24/2018 FINDINGS: NG tube tip and side port project over the upper abdomen. The side port is just beyond the gastroesophageal junction. Recommend advancing by 5 cm. Nonobstructive bowel gas pattern. IMPRESSION: NG tube side port just beyond the gastroesophageal junction. Recommend advancing by 5 cm. Electronically Signed   By: Ulyses Jarred M.D.   On: 10/28/2018 00:35    EKG:   Orders placed or performed in visit on 04/13/17  . 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  1.  Acute encephalopathy-initially secondary to hypoglycemia, currently  extubated and sugars are improved. -Has bilateral  encephalomalacia based on CT head.  Prior to left frontal craniotomy done.  Prior history of strokes. -Has right anterior cerebral artery aneurysm embolization in the past and clipping recently this year. -MRI cannot be done due to recent clipping. -Appreciate neurology consult.  Provigil has been started. -Consider repeat CT head if not improving -Patient remains n.p.o. due to his mental status.  Tube feeds have been started using NG tube now  2.  Sepsis-secondary to urinary tract infection.  All cultures have been negative.  Off pressors.  On IV Solu-Cortef.  Wean off steroids as tolerated -Broad-spectrum antibiotics with cefepime- finished  3.  Acute renal failure-secondary to acute urinary retention with history of BPH -Appreciate nephrology consult.  Bilateral hydronephrosis on renal ultrasound.  Status post Foley cath -Renal function is normalized at this time  4.  Poor oral intake-started on tube feeds yesterday via NG tube. -GI has been consulted for PEG tube.  However given acute mental status changes and new stroke-GI recommended awaiting to see neurological recovery prior to placing a G-tube.  Patient had a G-tube and tracheostomy which was recently remote couple of months ago and patient was on a regular diet.  Continue to monitor at this time.  5.  Seizure disorder-prophylactically on Keppra.  6.  DVT prophylaxis-subcutaneous heparin   Will be transferred to floor today Physical therapy consult  All the records are reviewed and case discussed with Care Management/Social Workerr. Management plans discussed with the patient, family and they are in agreement.  CODE STATUS: Full code  TOTAL TIME TAKING CARE OF THIS PATIENT: 33 minutes.   POSSIBLE D/C IN ?  DAYS, DEPENDING ON CLINICAL CONDITION.   Gladstone Lighter M.D on 10/29/2018 at 11:22 AM  Between 7am to 6pm - Pager - [phone]  After 6pm go to [email or web address] - password [email or web address] [place] [facility]   Office  [phone]  CC: Primary care physician; Garwin Brothers, MD

## 2018-10-29 NOTE — Progress Notes (Signed)
Pharmacy Electrolyte Monitoring Consult:  Pharmacy consulted to assist in monitoring and replacing electrolytes in this 63 y.o. male admitted on 10/19/2018 with Altered Mental Status   Labs:  Sodium (mmol/L)  Date Value  10/29/2018 140   Potassium (mmol/L)  Date Value  10/29/2018 3.8   Magnesium (mg/dL)  Date Value  10/29/2018 2.0   Phosphorus (mg/dL)  Date Value  10/28/2018 2.5   Calcium (mg/dL)  Date Value  10/29/2018 8.1 (L)   Albumin (g/dL)  Date Value  10/21/2018 2.4 (L)   Corrected Calcium: 9.38  Assessment/Plan: Patient continues on MIVF of D5/17mEq of potassium at 71mL/hr.   Patient is on [place]. Will continue to replace magnesium for goal ~ 2  Will replace for goal potassium ~4, goal magnesium ~ 2, and goal phosphorus ~ 2.5.   No additional replenishment warranted at this time.  Will obtain electrolytes with am labs.   Pharmacy will continue to monitor and adjust per consult.    Lu Duffel, PharmD, BCPS Clinical Pharmacist 10/29/2018 12:41 PM

## 2018-10-29 NOTE — Evaluation (Signed)
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 who presented from his facility unresponsive requiring him to be ventilated in the ED. Patient has been seen in previous years at behavioral health for suicide ideation.   Clinical Impression  Patient is a 63 year old male whose physical therapy evaluation was limited by cognition and agitation/fixation upon NG tube. Patient did not demonstrate ability to follow commands or understanding of PT, however patient was able to follow PT with eyes and turn head to maintain eye contact. Patient actively moves UE's and required max re-direction to stop attempts at pulling at NG tube with mitts. Patient nurse notified. Patient actively moved R foot and toes without assistance. Attempts at PT assist with knee and hip movement resulted in tone and no participation, upon attempt of quick stretch to assess muscle activation capacity patient became irritated/agitated indicating he can feel sensation to his LE's. Patient's history obtained through previous documentation due to nonverbal current state. Patient did demonstrate ability to lift head off bed and maintain eye contact with PT. Patient will benefit from skilled physical therapy while in hospital to continue progressing mobility, functional muscle activation, and increase independence. Patient will benefit from SNF placement upon discharge due to need for continued skilled therapy and assistance.     Follow Up Recommendations SNF    Equipment Recommendations  None recommended by PT(facility should have needed equipment)    Recommendations for Other Services OT consult     Precautions / Restrictions  Precautions Precautions: Fall Restrictions Weight Bearing Restrictions: No      Mobility  Bed Mobility               General bed mobility comments: unable to follow commands, unable to attempt rolling or EOB due to agitation and fixation  upon NG tube  Transfers                 General transfer comment: not safe at this time  Ambulation/Gait                Stairs            Wheelchair Mobility    Modified Rankin (Stroke Patients Only)       Balance Overall balance assessment: Needs assistance     Sitting balance - Comments: not able to assess, dependent transfers                                     Pertinent Vitals/Pain Pain Assessment: No/denies pain    Home Living Family/patient expects to be discharged to:: Skilled nursing facility                 Additional Comments: Per previous documentation: Patient admitted from SNF facility. Was admitted to [facility] after head tracheostomy then went to [facility] then went to a skilled nursing    Prior Function Level of Independence: Needs assistance   Gait / Transfers Assistance Needed: Per previous documentation patient was primarily w/c bound but with extensive PT he had began to stand independently   ADL's / Homemaking Assistance Needed: Per previous documentation patient was able to feed himself  Comments: Per  previous documentation patient's baseline is alert and oriented, able to feed self, self propel wheelchair and recently began standing independently      Hand Dominance   Dominant Hand: Right    Extremity/Trunk Assessment   Upper Extremity Assessment Upper Extremity Assessment: Defer to OT evaluation    Lower Extremity Assessment Lower Extremity Assessment: Difficult to assess due to impaired cognition(unable to follow commands, high tone noted in BLEs, able to wiggle R toes, not left. Became irritated at attempt to [facility]. )       Communication    Communication: HOH  Cognition Arousal/Alertness: Awake/alert Behavior During Therapy: Anxious;Agitated Overall Cognitive Status: Difficult to assess                                 General Comments: Patient able to follow PT with eyes, turn head, to watch. Unable to follow any commands. fixated on attempting to pull NG tube out with mitts.       General Comments      Exercises Other Exercises Other Exercises: patient demonstrates ability to flex and extend elbows, reach for PT and across body, fixated upon NG tube but is able to turn attention away momentarily with tactile cueing to other body parts Other Exercises: Attempted quick stretch to LE musculature to assess muscle belly response, high tone noted with patient becoming agitated indicating ability to feel sensation in LE's.  Other Exercises: cervical ROM screening: able to turn head L and R to follow PT with his head/eyes    Assessment/Plan    PT Assessment Patient needs continued PT services  PT Problem List Decreased strength;Decreased range of motion;Decreased activity tolerance;Decreased balance;Decreased knowledge of use of DME;Decreased cognition;Decreased coordination;Decreased mobility;Decreased safety awareness;Impaired tone       PT Treatment Interventions DME instruction;Functional mobility training;Therapeutic activities;Therapeutic exercise;Neuromuscular re-education;Gait training;Balance training;Cognitive remediation;Manual techniques;Wheelchair mobility training;Patient/family education    PT Goals (Current goals can be found in the Care Plan section)  Acute Rehab PT Goals PT Goal Formulation: Patient unable to participate in goal setting    Frequency Min 2X/week   Barriers to discharge   was at facility, would benefit from return to SNF    Co-evaluation               AM-PAC PT "6 Clicks" Mobility  Outcome Measure Help needed turning from your back to your side while in a flat bed  without using bedrails?: Total Help needed moving from lying on your back to sitting on the side of a flat bed without using bedrails?: Total Help needed moving to and from a bed to a chair (including a wheelchair)?: Total Help needed standing up from a chair using your arms (e.g., wheelchair or bedside chair)?: Total Help needed to walk in hospital room?: Total Help needed climbing 3-5 steps with a railing? : Total 6 Click Score: 6    End of Session   Activity Tolerance: Treatment limited secondary to agitation;Other (comment)(cognition ) Patient left: in bed;with call bell/phone within reach;with bed alarm set Nurse Communication: Mobility status;Other (comment)(fixation on NG tube and attempts to touch/pull with mitts) PT Visit Diagnosis: Unsteadiness on feet (R26.81);Other abnormalities of gait and mobility (R26.89);Muscle weakness (generalized) (M62.81);Difficulty in walking, not elsewhere classified (R26.2);Other symptoms and signs involving the nervous system (R29.898)    Time: 1207-1221 PT Time Calculation (min) (ACUTE ONLY): 14 min   Charges:   PT Evaluation $PT Eval High Complexity: 1 High  Janna Arch, PT, DPT    Janna Arch 10/29/2018, 1:18 PM

## 2018-10-29 NOTE — TOC Initial Note (Addendum)
Transition of Care [facility]) - Initial/Assessment Note    Patient Details  Name: [name] MRN: [id number] Date of Birth: [birth year]  Transition of Care [facility]) CM/SW Contact:    Marshell Garfinkel, RN Phone Number: 10/29/2018, 3:39 PM  Clinical Narrative:                 Patient received from ICU today. RNCM reached out to patients daughter [name] [phone] who claims to have power attorney of patient. Prior to March 2020, patient is independent however at that time patient suffered from aneurysm that "led to a stroke" requesting 2 months ICU stay at [facility]. He then transitioned to [facility]. He presents to this hospital from [facility] where he was progressing per daughter and had begun walking and talking again. They were planning to send him home.  She prefers that he return to [facility] however he does not have any more SNF days available and pending Medicaid. Message left for Aleene Davidson with financial assistance [phone]. PASRR [id number] A. I have also reached out to [place] with [facility] to see if he meets criteria for LTAC again.   Expected Discharge Plan: Skilled Nursing Facility Barriers to Discharge: SNF Pending Medicaid   Patient Goals and CMS Choice   CMS Medicare.gov Compare Post Acute Care list provided to:: Patient Represenative (must comment)([name] daughter [phone]) Choice offered to / list presented to : Adult Children  Expected Discharge Plan and Services Expected Discharge Plan: [place]                                              Prior Living Arrangements/Services                       Activities of Daily Living Home Assistive Devices/Equipment: None ADL Screening (condition at time of admission) Patient's cognitive ability adequate to safely complete daily activities?: Yes Is the patient deaf or have difficulty hearing?: No Does the patient have difficulty seeing, even when wearing  glasses/contacts?: No Does the patient have difficulty concentrating, remembering, or making decisions?: No Patient able to express need for assistance with ADLs?: Yes Does the patient have difficulty dressing or bathing?: No Independently performs ADLs?: Yes (appropriate for developmental age) Does the patient have difficulty walking or climbing stairs?: No Weakness of Legs: None Weakness of Arms/Hands: None  Permission Sought/Granted                  Emotional Assessment              Admission diagnosis:  Acute urinary retention [R33.8] Acute renal failure, unspecified acute renal failure type ([place]) [N17.9] Altered mental status, unspecified altered mental status type [R41.82] Patient Active Problem List   Diagnosis Date Noted  . Pressure injury of skin 10/23/2018  . Sepsis ([place]) 10/19/2018  . Severe major depression ([place]) 04/14/2017  . Major depressive disorder, recurrent ([place]) 04/09/2017  . MDD (major depressive disorder), recurrent severe, without psychosis ([place]) 04/09/2017  . Family history of brain aneurysm   . Intractable vomiting 10/20/2016  . History of intracranial aneurysm   . Hypokalemia   . AKI (acute kidney injury) ([place])   . Prolonged QT interval   . Lactic acidosis   . Dysphagia   . Esophageal ring   . Gastroesophageal reflux disease with esophagitis   .  Nausea and vomiting 10/01/2016  . 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 Severe episode of recurrent major depressive disorder, without psychotic features ([place]) 07/20/2016  . CVA (cerebral vascular accident) ([place]) 06/15/2016  . Abdominal pain   . Numbness   . Carpal tunnel syndrome, bilateral 12/16/2015  . Status post stroke 11/12/2015  . Transient neurologic deficit 10/15/2015  . Cerebrovascular accident, late effects 10/07/2015  . Complicated migraine   . Anxiety state   . 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 Cerebral infarction due to embolism of right middle cerebral artery ([place]) 09/23/2015   . Cerebral infarction due to embolism of cerebral artery ([place]) 09/23/2015  . Gait disturbance, post-stroke   . Drooping of mouth   . Cerebrovascular accident (CVA) due to embolism of right anterior cerebral artery ([place])   . Benign essential HTN   . Migraine with aura and without status migrainosus, not intractable   . Tachypnea   . Prediabetes   . Acute blood loss anemia   . Brain aneurysm 09/20/2015  . Type 2 diabetes mellitus ([place]) 09/15/2015  . Leukocytosis 09/15/2015  . Aneurysm, cerebral, nonruptured 09/15/2015  . TIA (transient ischemic attack) 09/14/2015  . Temporary cerebral vascular dysfunction 09/14/2015  . H/O transient cerebral ischemia 07/29/2015  . At risk for falling 07/29/2015  . Bilateral hearing loss 10/25/2014  . Abnormal fear 08/08/2012  . Headache, migraine 08/08/2012  . Arthritis, degenerative 08/08/2012  . History of tear of ACL (anterior cruciate ligament) 03/08/2011  . History of knee problem 03/08/2011  . Depression 09/07/2010  . Depressive disorder, not elsewhere classified 09/07/2010  . URI (upper respiratory infection) 08/24/2010  . Hearing loss 09/06/2006  . Hyperlipidemia 04/23/2006  . Essential hypertension 04/23/2006  . Psychophysiological insomnia 04/23/2006   PCP:  Garwin Brothers, MD Pharmacy:   [facility] [address] [place] - [address] [place] [address] Phone: [phone] Fax: [phone]     Social Determinants of Health (SDOH) Interventions    Readmission Risk Interventions No flowsheet data found.

## 2018-10-29 NOTE — Progress Notes (Signed)
Pharmacy Electrolyte Monitoring Consult:  Pharmacy consulted to assist in monitoring and replacing electrolytes in this 63 y.o. male admitted on 10/19/2018 with Altered Mental Status   Labs:  Sodium (mmol/L)  Date Value  10/29/2018 140   Potassium (mmol/L)  Date Value  10/29/2018 3.8   Magnesium (mg/dL)  Date Value  10/28/2018 1.8   Phosphorus (mg/dL)  Date Value  10/28/2018 2.5   Calcium (mg/dL)  Date Value  10/29/2018 8.1 (L)   Albumin (g/dL)  Date Value  10/21/2018 2.4 (L)   Corrected Calcium: 9.38  Assessment/Plan: Patient continues on MIVF of D5/49mEq of potassium at 22mL/hr.   Patient received KCl 40 mEq q4h x2 yesterday  Patient is on Keppra. Will continue to replace magnesium for goal ~ 2: F/U magnesium level in am  Will replace for goal potassium ~4, goal magnesium ~ 2, and goal phosphorus ~ 2.5.   Will obtain electrolytes with am labs.   Pharmacy will continue to monitor and adjust per consult.    Dallie Piles, PharmD 10/29/2018 9:00 AM

## 2018-10-30 LAB — BASIC METABOLIC PANEL
Anion gap: 8 (ref 5–15)
BUN: 16 mg/dL (ref 8–23)
CO2: 24 mmol/L (ref 22–32)
Calcium: 8.3 mg/dL — ABNORMAL LOW (ref 8.9–10.3)
Chloride: 105 mmol/L (ref 98–111)
Creatinine, Ser: 0.69 mg/dL (ref 0.61–1.24)
GFR calc Af Amer: >60 mL/min (ref >60)
GFR calc non Af Amer: >60 mL/min (ref >60)
Glucose, Bld: 114 mg/dL — ABNORMAL HIGH (ref 70–99)
Potassium: 4.3 mmol/L (ref 3.5–5.1)
Sodium: 137 mmol/L (ref 135–145)

## 2018-10-30 LAB — GLUCOSE, CAPILLARY
Glucose-Capillary: 100 mg/dL — ABNORMAL HIGH (ref 70–99)
Glucose-Capillary: 132 mg/dL — ABNORMAL HIGH (ref 70–99)
Glucose-Capillary: 128 mg/dL — ABNORMAL HIGH (ref 70–99)
Glucose-Capillary: 129 mg/dL — ABNORMAL HIGH (ref 70–99)
Glucose-Capillary: 126 mg/dL — ABNORMAL HIGH (ref 70–99)
Glucose-Capillary: 139 mg/dL — ABNORMAL HIGH (ref 70–99)

## 2018-10-30 LAB — MAGNESIUM: Magnesium: 1.6 mg/dL — ABNORMAL LOW (ref 1.7–2.4)

## 2018-10-30 LAB — PHOSPHORUS: Phosphorus: 3 mg/dL (ref 2.5–4.6)

## 2018-10-30 MED ORDER — MODAFINIL 100 MG PO TABS
200.0000 mg | ORAL_TABLET | Freq: Two times a day (BID) | ORAL | Status: DC
  Administered 2018-11-01 – 2018-11-28 (×56): 200 mg
  Filled 2018-10-30 (×56): qty 2

## 2018-10-30 MED ORDER — MODAFINIL 100 MG PO TABS
200.0000 mg | ORAL_TABLET | Freq: Two times a day (BID) | ORAL | Status: DC
  Filled 2018-10-30: qty 2

## 2018-10-30 MED ORDER — MODAFINIL 100 MG PO TABS
200.0000 mg | ORAL_TABLET | Freq: Two times a day (BID) | ORAL | Status: DC
  Administered 2018-10-30: 200 mg via ORAL

## 2018-10-30 MED ORDER — MAGNESIUM SULFATE 2 GM/50ML IV SOLN
2.0000 g | Freq: Once | INTRAVENOUS | Status: AC
  Administered 2018-10-30: 11:00:00 2 g via INTRAVENOUS
  Filled 2018-10-30: qty 50

## 2018-10-30 MED ORDER — DEXTROSE 5 % IV SOLN
INTRAVENOUS | Status: DC
  Administered 2018-10-30 – 2018-11-02 (×2): via INTRAVENOUS

## 2018-10-30 NOTE — TOC Progression Note (Addendum)
Transition of Care [facility]) - Progression Note    Patient Details  Name: NEAL TRULSON MRN: [id number] Date of Birth: 27-Mar-1956  Transition of Care [facility]) CM/SW Contact  Marshell Garfinkel, RN Phone Number: 10/30/2018, 3:25 PM  Clinical Narrative:     This RNCM received call from patient's daughter [name] [phone] that was crying stating that she "just received a call from [facility] stating that her father's condition may be irreversible". She requests that bed search be to three SNF's including [facility] although "this is not her first choice because she lives in [place]", [place] and [facility]. [place] assessment completed with bedside RN.  Palliative would be beneficial to help with goals of care discussion with [name] and cost vs benefit of long-term feeding tube as no facility will take with NG tube.  Patient is a full-code at this time. Bed search started. RNCM will need to seek authorization through [place] [phone] fax [phone]. [name] states that he does not have any more SNF days available and she is waiting for response on Medicaid applications. Expected Discharge Plan: Skilled Nursing Facility Barriers to Discharge: SNF Pending Medicaid  Expected Discharge Plan and Services Expected Discharge Plan: [place]                                               Social Determinants of Health (SDOH) Interventions    Readmission Risk Interventions No flowsheet data found.

## 2018-10-30 NOTE — Care Management Important Message (Signed)
Important Message  Patient Details  Name: [name] MRN: [id number] Date of Birth: Jan 18, 1956   Medicare Important Message Given:  Yes    [name] A Barri Neidlinger 10/30/2018, 11:52 AM

## 2018-10-30 NOTE — NC FL2 (Signed)
[place] LEVEL OF CARE SCREENING TOOL     IDENTIFICATION  Patient Name: [name] Birthdate: [birth year] Sex: male Admission Date (Current Location): 10/19/2018  [place] and [place] Number:  Engineering geologist and Address:  [facility], [address], [place], [place] [address]      Provider Number: [id number]  Attending Physician Name and Address:  Gladstone Lighter, MD  Relative Name and Phone Number:  Mindy Behnken [phone]    Current Level of Care: Hospital Recommended Level of Care: [place] Prior Approval Number:    Date Approved/Denied:   PASRR Number: [id number]  Discharge Plan: SNF    Current Diagnoses: Patient Active Problem List   Diagnosis Date Noted  . Pressure injury of skin 10/23/2018  . Sepsis ([place]) 10/19/2018  . Severe major depression ([place]) 04/14/2017  . Major depressive disorder, recurrent ([place]) 04/09/2017  . MDD (major depressive disorder), recurrent severe, without psychosis ([place]) 04/09/2017  . Family history of brain aneurysm   . Intractable vomiting 10/20/2016  . History of intracranial aneurysm   . Hypokalemia   . AKI (acute kidney injury) ([place])   . Prolonged QT interval   . Lactic acidosis   . Dysphagia   . Esophageal ring   . Gastroesophageal reflux disease with esophagitis   . Nausea and vomiting 10/01/2016  . 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 Severe episode of recurrent major depressive disorder, without psychotic features ([place]) 07/20/2016  . CVA (cerebral vascular accident) ([place]) 06/15/2016  . Abdominal pain   . Numbness   . Carpal tunnel syndrome, bilateral 12/16/2015  . Status post stroke 11/12/2015  . Transient neurologic deficit 10/15/2015  . Cerebrovascular accident, late effects 10/07/2015  . Complicated migraine   . Anxiety state   . 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 Cerebral infarction due to embolism of right middle cerebral artery ([place]) 09/23/2015  .  Cerebral infarction due to embolism of cerebral artery ([place]) 09/23/2015  . Gait disturbance, post-stroke   . Drooping of mouth   . Cerebrovascular accident (CVA) due to embolism of right anterior cerebral artery ([place])   . Benign essential HTN   . Migraine with aura and without status migrainosus, not intractable   . Tachypnea   . Prediabetes   . Acute blood loss anemia   . Brain aneurysm 09/20/2015  . Type 2 diabetes mellitus ([place]) 09/15/2015  . Leukocytosis 09/15/2015  . Aneurysm, cerebral, nonruptured 09/15/2015  . TIA (transient ischemic attack) 09/14/2015  . Temporary cerebral vascular dysfunction 09/14/2015  . H/O transient cerebral ischemia 07/29/2015  . At risk for falling 07/29/2015  . Bilateral hearing loss 10/25/2014  . Abnormal fear 08/08/2012  . Headache, migraine 08/08/2012  . Arthritis, degenerative 08/08/2012  . History of tear of ACL (anterior cruciate ligament) 03/08/2011  . History of knee problem 03/08/2011  . Depression 09/07/2010  . Depressive disorder, not elsewhere classified 09/07/2010  . URI (upper respiratory infection) 08/24/2010  . Hearing loss 09/06/2006  . Hyperlipidemia 04/23/2006  . Essential hypertension 04/23/2006  . Psychophysiological insomnia 04/23/2006    Orientation RESPIRATION BLADDER Height & Weight        Normal Incontinent Weight: 100.4 kg Height:  6' 0.01" (182.9 cm)  BEHAVIORAL SYMPTOMS/MOOD NEUROLOGICAL BOWEL NUTRITION STATUS      Incontinent Feeding tube(NG currently)  AMBULATORY STATUS COMMUNICATION OF NEEDS Skin   Total Care Non-Verbally Other (Comment)(excorated on buttocks)  Personal Care Assistance Level of Assistance  Total care           Functional Limitations Info             SPECIAL CARE FACTORS FREQUENCY                       Contractures Contractures Info: Not present    Additional Factors Info  Code Status, Allergies(Full code; Allergic to Metformin) Code Status  Info: Full code Allergies Info: Metformin           Current Medications (10/30/2018):  This is the current hospital active medication list Current Facility-Administered Medications  Medication Dose Route Frequency Provider Last Rate Last Dose  . 0.9 %  sodium chloride infusion   Intravenous PRN Gladstone Lighter, MD 5 mL/hr at 10/29/18 2312 30 mL at 10/29/18 2312  . acetaminophen (TYLENOL) suppository 650 mg  650 mg Rectal Q6H PRN Lance Coon, MD   650 mg at 10/29/18 2335  . chlorhexidine (PERIDEX) 0.12 % solution 15 mL  15 mL Mouth Rinse BID Ottie Glazier, MD   15 mL at 10/30/18 0922  . Chlorhexidine Gluconate Cloth 2 % PADS 6 each  6 each Topical Q0600 Gladstone Lighter, MD   6 each at 10/30/18 [phone]  . dextrose 5 % solution   Intravenous Continuous Lu Duffel, [place] 50 mL/hr at 10/30/18 3016    . famotidine (PEPCID) IVPB 20 mg premix  20 mg Intravenous BID Eliezer Bottom T, MD 100 mL/hr at 10/30/18 0922 20 mg at 10/30/18 0922  . feeding supplement (OSMOLITE 1.5 CAL) liquid 1,000 mL  1,000 mL Per Tube Continuous Aleskerov, Fuad, MD 60 mL/hr at 10/30/18 1010 1,000 mL at 10/30/18 1010  . feeding supplement (PRO-STAT SUGAR FREE 64) liquid 30 mL  30 mL Per Tube BID Ottie Glazier, MD   30 mL at 10/30/18 0950  . free water 30 mL  30 mL Per Tube Q4H Ottie Glazier, MD   30 mL at 10/30/18 1519  . heparin injection 5,000 Units  5,000 Units Subcutaneous Q8H Fritzi Mandes, MD   5,000 Units at 10/30/18 1519  . levETIRAcetam (KEPPRA) IVPB 500 mg/100 mL premix  500 mg Intravenous Q12H Gladstone Lighter, MD 400 mL/hr at 10/30/18 1055 500 mg at 10/30/18 1055  . MEDLINE mouth rinse  15 mL Mouth Rinse q12n4p Ottie Glazier, MD   15 mL at 10/30/18 1519  . modafinil (PROVIGIL) tablet 200 mg  200 mg Oral BID Alexis Goodell, MD      . nutrition supplement (JUVEN) (JUVEN) powder packet 1 packet  1 packet Per Tube BID BM Ottie Glazier, MD   1 packet at 10/30/18 1519  . polyethylene glycol  (MIRALAX / GLYCOLAX) packet 17 g  17 g Oral Daily PRN Fritzi Mandes, MD         Discharge Medications: Please see discharge summary for a list of discharge medications.  Relevant Imaging Results:  Relevant Lab Results:   Additional Information SS# [id number]  Marshell Garfinkel, RN

## 2018-10-30 NOTE — Progress Notes (Signed)
Subjective: Patient admitted 5/23 with hypoglycemia.  CBG 43.  Had multiple other metabolic issues as well.  Required intubation.  Has remained altered since extubation and has not returned to baseline.  Patient is not eating.     Objective: Current vital signs: BP (!) 142/76 (BP Location: Right Arm)   Pulse 100   Temp 99.6 F (37.6 C) (Axillary)   Resp 17   Ht 6' 0.01" (1.829 m)   Wt 100.4 kg   SpO2 99%   BMI 30.01 kg/m  Vital signs in last 24 hours: Temp:  [99.6 F (37.6 C)-100.6 F (38.1 C)] 99.6 F (37.6 C) (06/03 0438) Pulse Rate:  [89-103] 100 (06/03 0438) Resp:  [16-17] 17 (06/03 0438) BP: (133-144)/(75-79) 142/76 (06/03 0438) SpO2:  [96 %-99 %] 99 % (06/03 0438)  Intake/Output from previous day: 06/02 0701 - 06/03 0700 In: 3240 [I.V.:983.3; NG/GT:2064; IV Piggyback:192.7] Out: 1400 [Urine:400; Emesis/NG output:1000] Intake/Output this shift: Total I/O In: 892.1 [I.V.:742.1; IV Piggyback:150] Out: -  Nutritional status:  Diet Order            Diet NPO time specified  Diet effective now              Neurologic Exam: Mental Status: Patient does not respond to verbal stimuli.  Grimaces with deep sternal rub.  Does not follow commands.  No verbalizations noted.  Cranial Nerves: II: Blinks to confrontation bilaterally and appears to fix on examiner at times, pupils right 3 mm, left 3 mm,and reactive bilaterally III,IV,VI: doll's response present bilaterally.  V,VII: corneal reflex present bilaterally  VIII: patient does not respond to verbal stimuli IX,X: gag reflex not tested, XI: trapezius strength unable to test bilaterally XII: tongue strength unable to test Motor: Extremities flaccid throughout.  No spontaneous movement noted.  No purposeful movements noted. Sensory: Grimaces to noxious stimuli in all extremities but does not localize. Deep Tendon Reflexes:  Symmetric throughout with absent AJ's. Plantars: Mute bilaterally Cerebellar: Unable to  perform     Lab Results: Basic Metabolic Panel: Recent Labs  Lab 10/25/18 1819 10/26/18 0409 10/26/18 1811 10/27/18 0522 10/28/18 0511 10/29/18 0250 10/30/18 0528  NA  --  142  --  142 143 140 137  K 3.1* 3.0* 3.7 3.6 3.3* 3.8 4.3  CL  --  111  --  111 112* 109 105  CO2  --  23  --  22 22 23 24   GLUCOSE  --  142*  --  141* 113* 135* 114*  BUN  --  20  --  16 13 14 16   CREATININE  --  0.73  --  0.62 0.75 0.79 0.69  CALCIUM  --  6.6*  --  7.6* 8.1* 8.1* 8.3*  MG  --  1.7  --  1.9 1.8 2.0 1.6*  PHOS <1.0* 2.4*  --  2.6 2.5  --  3.0    Liver Function Tests: No results for input(s): AST, ALT, ALKPHOS, BILITOT, PROT, ALBUMIN in the last 168 hours. No results for input(s): LIPASE, AMYLASE in the last 168 hours. No results for input(s): AMMONIA in the last 168 hours.  CBC: Recent Labs  Lab 10/24/18 0242 10/25/18 0302 10/26/18 0409 10/29/18 0250  WBC 10.1 10.1 11.0* 13.2*  NEUTROABS 7.0 6.9 7.8* 8.6*  HGB 8.2* 8.6* 9.2* 10.1*  HCT 26.7* 27.6* 30.0* 32.0*  MCV 87.0 86.0 86.2 85.3  PLT 282 287 293 356    Cardiac Enzymes: No results for input(s): CKTOTAL, CKMB, CKMBINDEX, TROPONINI in  the last 168 hours.  Lipid Panel: No results for input(s): CHOL, TRIG, HDL, CHOLHDL, VLDL, LDLCALC in the last 168 hours.  CBG: Recent Labs  Lab 10/29/18 1845 10/29/18 2129 10/30/18 0107 10/30/18 0439 10/30/18 0740  GLUCAP 142* 125* 100* 132* 128*    Microbiology: Results for orders placed or performed during the hospital encounter of 10/19/18  SARS Coronavirus 2 (CEPHEID - Performed in [place] hospital lab), Hosp Order     Status: None   Collection Time: 10/19/18  9:28 AM  Result Value Ref Range Status   SARS Coronavirus 2 NEGATIVE NEGATIVE Final    Comment: (NOTE) If result is NEGATIVE SARS-CoV-2 target nucleic acids are NOT DETECTED. The SARS-CoV-2 RNA is generally detectable in upper and lower  respiratory specimens during the acute phase of infection. The lowest   concentration of SARS-CoV-2 viral copies this assay can detect is 250  copies / mL. A negative result does not preclude SARS-CoV-2 infection  and should not be used as the sole basis for treatment or other  patient management decisions.  A negative result may occur with  improper specimen collection / handling, submission of specimen other  than nasopharyngeal swab, presence of viral mutation(s) within the  areas targeted by this assay, and inadequate number of viral copies  (<250 copies / mL). A negative result must be combined with clinical  observations, patient history, and epidemiological information. If result is POSITIVE SARS-CoV-2 target nucleic acids are DETECTED. The SARS-CoV-2 RNA is generally detectable in upper and lower  respiratory specimens dur ing the acute phase of infection.  Positive  results are indicative of active infection with SARS-CoV-2.  Clinical  correlation with patient history and other diagnostic information is  necessary to determine patient infection status.  Positive results do  not rule out bacterial infection or co-infection with other viruses. If result is PRESUMPTIVE POSTIVE SARS-CoV-2 nucleic acids MAY BE PRESENT.   A presumptive positive result was obtained on the submitted specimen  and confirmed on repeat testing.  While 2019 novel coronavirus  (SARS-CoV-2) nucleic acids may be present in the submitted sample  additional confirmatory testing may be necessary for epidemiological  and / or clinical management purposes  to differentiate between  SARS-CoV-2 and other Sarbecovirus currently known to infect humans.  If clinically indicated additional testing with an alternate test  methodology [phone]) is advised. The SARS-CoV-2 RNA is generally  detectable in upper and lower respiratory sp ecimens during the acute  phase of infection. The expected result is Negative. Fact Sheet for Patients:  StrictlyIdeas.no Fact Sheet  for Healthcare Providers: BankingDealers.co.za This test is not yet approved or cleared by the [place] FDA and has been authorized for detection and/or diagnosis of SARS-CoV-2 by FDA under an Emergency Use Authorization (EUA).  This EUA will remain in effect (meaning this test can be used) for the duration of the COVID-19 declaration under Section 564(b)(1) of the Act, 21 U.S.C. section 360bbb-3(b)(1), unless the authorization is terminated or revoked sooner. Performed at [facility], [name] [place], [place] [address]   Blood culture (routine x 2)     Status: None   Collection Time: 10/19/18  9:36 AM  Result Value Ref Range Status   Specimen Description BLOOD LUA  Final   Special Requests   Final    BOTTLES DRAWN AEROBIC AND ANAEROBIC Blood Culture adequate volume   Culture   Final    NO GROWTH 5 DAYS Performed at [facility], [address] [place], [place] [address]    Report Status 10/24/2018 FINAL  Final  Blood culture (routine x 2)     Status: None   Collection Time: 10/19/18 10:16 AM  Result Value Ref Range Status   Specimen Description BLOOD LFA  Final   Special Requests   Final    BOTTLES DRAWN AEROBIC AND ANAEROBIC Blood Culture results may not be optimal due to an inadequate volume of blood received in culture bottles   Culture   Final    NO GROWTH 5 DAYS Performed at [facility], [address] [place], [place] [address]    Report Status 10/24/2018 FINAL  Final  MRSA PCR Screening     Status: Abnormal   Collection Time: 10/19/18  1:06 PM  Result Value Ref Range Status   MRSA by PCR POSITIVE (A) NEGATIVE Final    Comment:        The GeneXpert MRSA Assay (FDA approved for NASAL specimens only), is one component of a comprehensive MRSA colonization surveillance program. It is not intended to diagnose MRSA infection nor to guide or monitor treatment for MRSA infections. CRITICAL RESULT CALLED  TO, READ BACK BY AND VERIFIED WITH: CALLED TO TANYA SILVA @1426  10/19/2018 [employer] Performed at [facility], [address], [place], [place] [address]   Urine Culture     Status: None   Collection Time: 10/21/18  4:10 PM  Result Value Ref Range Status   Specimen Description   Final    URINE, CATHETERIZED Performed at [facility], [address], [place], [place] [address]    Special Requests   Final    Normal Performed at [facility], [address] [place], [place] [address]    Culture   Final    NO GROWTH Performed at [facility], [place] [address] [place],  [address]    Report Status 10/22/2018 FINAL  Final    Coagulation Studies: No results for input(s): LABPROT, INR in the last 72 hours.  Imaging: No results found.  Medications:  I have reviewed the patient's current medications. Scheduled: . chlorhexidine  15 mL Mouth Rinse BID  . Chlorhexidine Gluconate Cloth  6 each Topical Q0600  . feeding supplement (PRO-STAT SUGAR FREE 64)  30 mL Per Tube BID  . free water  30 mL Per Tube Q4H  . heparin  5,000 Units Subcutaneous Q8H  . mouth rinse  15 mL Mouth Rinse q12n4p  . modafinil  100 mg Oral BID  . nutrition supplement (JUVEN)  1 packet Per Tube BID BM    Assessment/Plan: Patient admitted with hypoglycemia.  Remains poorly responsive.  Lab work improved from initial presentation.  Head CT performed on initial presentation and repeated on 5/29.  No acute changes noted, only chronic bifrontal encephalomalacia and changes from prior aneurysmal clipping.  Patient appears almost locked-in from neurological examination.  Patient may very well have cerebral injury that may have been caused by hypoglycemia.  This would not be seen on CT imaging and patient unable to have an MRI.  Supposedly has had some improvement from Modafinil but remains significantly altered.  Prognosis unclear but do not suspect significant improvement, at least in  the short term.     Recommendations: 1. May attempt further increases in Modafinil (200mg  BID) to see if patient will have a response.     LOS: 11 days   Alexis Goodell, MD Neurology [phone] 10/30/2018  10:32 AM

## 2018-10-30 NOTE — Progress Notes (Signed)
OT Cancellation Note  Patient Details Name: [name] MRN: [id number] DOB: 08-30-1955   Cancelled Treatment:    Reason Eval/Treat Not Completed: Other (comment). Due to high OT census, OT evaluation may be triaged until next date. Will continue to follow at a distance and initiate as able. Please call with any imminent needs or concerns.   Jeni Salles, MPH, MS, OTR/L [place] [phone] 10/30/18, 5:10 PM

## 2018-10-30 NOTE — Progress Notes (Signed)
[place] [facility] [place] NAME: [name]    MR#:  [id number]  DATE OF BIRTH:  March 07, 1956  SUBJECTIVE:  CHIEF COMPLAINT:   Chief Complaint  Patient presents with  . Altered Mental Status   -Patient about the same, alert and sometimes tracking but not following commands.  NG tube placed and on tube feeds  REVIEW OF SYSTEMS:  Review of Systems  Unable to perform ROS: Mental status change    DRUG ALLERGIES:   Allergies  Allergen Reactions  . Metformin Diarrhea    VITALS:  Blood pressure (!) 142/76, pulse 100, temperature 99.6 F (37.6 C), temperature source Axillary, resp. rate 17, height 6' 0.01" (1.829 m), weight 100.4 kg, SpO2 99 %.  PHYSICAL EXAMINATION:  Physical Exam   GENERAL:  63 y.o.-year-old ill-appearing patient lying in the bed with no acute distress.  EYES: Pupils equal, round, reactive to light and accommodation. No scleral icterus. Extraocular muscles intact.  Has a right-sided gaze HEENT: Head atraumatic, normocephalic. Oropharynx and nasopharynx clear.  NECK:  Supple, no jugular venous distention. No thyroid enlargement, no tenderness.  LUNGS: Normal breath sounds bilaterally, no wheezing, rales,rhonchi or crepitation. No use of accessory muscles of respiration.  CARDIOVASCULAR: S1, S2 normal. No murmurs, rubs, or gallops.  ABDOMEN: Soft, nontender, nondistended. Bowel sounds present. No organomegaly or mass.  EXTREMITIES: No pedal edema, cyanosis, or clubbing.  NEUROLOGIC: Cranial nerves II through XII are intact.  Tracking objects today.  Not following any commands.  Alert but not verbal.   Sensation intact. Gait not checked.  -Responding to touch, withdrawing.  Has 2+ symmetric bilateral lower extremity reflexes PSYCHIATRIC: The patient is alert but not oriented, remains nonverbal SKIN: No obvious rash, lesion, or ulcer.    LABORATORY PANEL:   CBC Recent Labs  Lab 10/29/18 0250  WBC 13.2*  HGB 10.1*   HCT 32.0*  PLT 356   ------------------------------------------------------------------------------------------------------------------  Chemistries  Recent Labs  Lab 10/30/18 0528  NA 137  K 4.3  CL 105  CO2 24  GLUCOSE 114*  BUN 16  CREATININE 0.69  CALCIUM 8.3*  MG 1.6*   ------------------------------------------------------------------------------------------------------------------  Cardiac Enzymes No results for input(s): TROPONINI in the last 168 hours. ------------------------------------------------------------------------------------------------------------------  RADIOLOGY:  No results found.  EKG:   Orders placed or performed in visit on 04/13/17  . 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  1.  Acute encephalopathy-initially secondary to hypoglycemia, currently extubated and sugars are improved. -Has bilateral encephalomalacia based on CT head.  Prior to left frontal craniotomy done.  Prior history of strokes. -Has right anterior cerebral artery aneurysm embolization in the past and clipping recently this year. -MRI cannot be done due to recent clipping. -Appreciate neurology consult.  Provigil has been started with some alertness, but still nonverbal and not following commands. -Requested neurology to reassess at this time. -tube feeds have been started using NG tube now  2.  Sepsis-secondary to urinary tract infection.  All cultures have been negative.  Off pressors.  Off steroids. --Finished broad-spectrum antibiotics  3.  Acute renal failure-secondary to acute urinary retention with history of BPH -Appreciate nephrology consult.  Bilateral hydronephrosis on renal ultrasound.  Status post  Foley cath -Renal function is normalized at  this time  4.  Poor oral intake-started on tube feeds via NG tube. -GI has been consulted for PEG tube.  However given acute mental status changes and new stroke-GI recommended awaiting to see neurological recovery prior to placing a G-tube. -  Patient had a G-tube and tracheostomy which was recently removed couple of months ago and patient was eating by mouth.  Continue to monitor at this time.  5.  Seizure disorder-prophylactically on Keppra.  6.  DVT prophylaxis-subcutaneous heparin   Physical therapy consult-need for SNF versus LTAC  All the records are reviewed and case discussed with Care Management/Social Workerr. Management plans discussed with the patient, family and they are in agreement.  CODE STATUS: Full code  TOTAL TIME TAKING CARE OF THIS PATIENT: 36 minutes.   POSSIBLE D/C IN ?  DAYS, DEPENDING ON CLINICAL CONDITION.   Gladstone Lighter M.D on 10/30/2018 at 8:54 AM  Between 7am to 6pm - Pager - [phone]  After 6pm go to [email or web address] - password [email or web address] [facility]  Office  [phone]  CC: Primary care physician; Garwin Brothers, MD

## 2018-10-30 NOTE — Progress Notes (Signed)
  Called and updated Ms. Amy Bellerose, patient's daughter who agrees with current plan of care

## 2018-10-30 NOTE — Progress Notes (Signed)
Pharmacy Electrolyte Monitoring Consult:  Pharmacy consulted to assist in monitoring and replacing electrolytes in this 63 y.o. male admitted on 10/19/2018 with Altered Mental Status   Labs:  Sodium (mmol/L)  Date Value  10/30/2018 137   Potassium (mmol/L)  Date Value  10/30/2018 4.3   Magnesium (mg/dL)  Date Value  10/30/2018 1.6 (L)   Phosphorus (mg/dL)  Date Value  10/30/2018 3.0   Calcium (mg/dL)  Date Value  10/30/2018 8.3 (L)   Albumin (g/dL)  Date Value  10/21/2018 2.4 (L)   Corrected Calcium: 9.38  Assessment/Plan: K=4.3, Mag = 1.6, Phos = 3.0  Patient currently on MIVF of D5/22mEq of potassium at 9mL/hr - will change to D5 @ 58ml/hr   No additional potassium or phosphorous replenishment warranted at this time.  Will give Mag Sulfate IV 2g x 1  Patient is on Keppra.   Will replace for goal potassium ~4, goal magnesium ~ 2, and goal phosphorus ~ 2.5.    Will obtain mag/K with am labs.   Pharmacy will continue to monitor and adjust per consult.    Lu Duffel, PharmD, BCPS Clinical Pharmacist 10/30/2018 7:43 AM

## 2018-10-30 NOTE — Plan of Care (Signed)

## 2018-10-31 ENCOUNTER — Inpatient Hospital Stay: Payer: Medicare HMO

## 2018-10-31 DIAGNOSIS — I6389 Other cerebral infarction: Secondary | ICD-10-CM

## 2018-10-31 DIAGNOSIS — J9600 Acute respiratory failure, unspecified whether with hypoxia or hypercapnia: Secondary | ICD-10-CM

## 2018-10-31 DIAGNOSIS — Z515 Encounter for palliative care: Secondary | ICD-10-CM

## 2018-10-31 DIAGNOSIS — Z7189 Other specified counseling: Secondary | ICD-10-CM

## 2018-10-31 DIAGNOSIS — N179 Acute kidney failure, unspecified: Secondary | ICD-10-CM

## 2018-10-31 LAB — BASIC METABOLIC PANEL
Anion gap: 10 (ref 5–15)
BUN: 24 mg/dL — ABNORMAL HIGH (ref 8–23)
CO2: 25 mmol/L (ref 22–32)
Calcium: 9 mg/dL (ref 8.9–10.3)
Chloride: 103 mmol/L (ref 98–111)
Creatinine, Ser: 0.74 mg/dL (ref 0.61–1.24)
GFR calc Af Amer: >60 mL/min (ref >60)
GFR calc non Af Amer: >60 mL/min (ref >60)
Glucose, Bld: 116 mg/dL — ABNORMAL HIGH (ref 70–99)
Potassium: 4.5 mmol/L (ref 3.5–5.1)
Sodium: 138 mmol/L (ref 135–145)

## 2018-10-31 LAB — GLUCOSE, CAPILLARY
Glucose-Capillary: 101 mg/dL — ABNORMAL HIGH (ref 70–99)
Glucose-Capillary: 112 mg/dL — ABNORMAL HIGH (ref 70–99)
Glucose-Capillary: 117 mg/dL — ABNORMAL HIGH (ref 70–99)
Glucose-Capillary: 118 mg/dL — ABNORMAL HIGH (ref 70–99)
Glucose-Capillary: 125 mg/dL — ABNORMAL HIGH (ref 70–99)
Glucose-Capillary: 148 mg/dL — ABNORMAL HIGH (ref 70–99)
Glucose-Capillary: 148 mg/dL — ABNORMAL HIGH (ref 70–99)

## 2018-10-31 LAB — MAGNESIUM: Magnesium: 1.8 mg/dL (ref 1.7–2.4)

## 2018-10-31 MED ORDER — FUROSEMIDE 10 MG/ML IJ SOLN
20.0000 mg | Freq: Once | INTRAMUSCULAR | Status: AC
  Administered 2018-10-31: 04:00:00 20 mg via INTRAVENOUS
  Filled 2018-10-31: qty 2

## 2018-10-31 MED ORDER — FREE WATER
150.0000 mL | Status: DC
  Administered 2018-10-31 – 2018-11-28 (×165): 150 mL
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
## 2018-10-31 NOTE — Progress Notes (Signed)
Nutrition Follow-up  RD working remotely.  DOCUMENTATION CODES:   Obesity unspecified  INTERVENTION:  Continue Osmolite 1.5 Cal at goal rate of 60 mL/hr + Pro-Stat 30 mL BID per tube. Provides 2360 kcal, 120 grams of protein, 1094 mL H2O daily.  Provide free water flush of 150 mL Q4hrs. This provides a total of 1994 mL H2O daily including water in tube feeding.  Goal regimen meets 100% RDIs for vitamins/minerals.  Continue Juven BID per tube to promote wound healing.  NUTRITION DIAGNOSIS:   Inadequate oral intake related to inability to eat as evidenced by NPO status.  Ongoing.  GOAL:   Patient will meet greater than or equal to 90% of their needs  Met with TF regimen.  MONITOR:   Diet advancement, Labs, Weight trends, I & O's, Skin  REASON FOR ASSESSMENT:   Ventilator    ASSESSMENT:   63 year old male with PMHx of HTN, HLD, depression, anxiety, hx CVA, hx drug abuse admitted with acute mental status change in setting of prior cerebral hemorrhage and CVA and requiring intubation on 5/23 for airway protection, also with urinary retention with acute renal failure.  Patient continues to have AMS and is unable to eat by mouth. Per Neurology note from yesterday patient may not have significant improvement in the short term. He continues on tube feeds per NGT. Spoke with RN over the phone. Patient pulled out his NGT overnight. Tube was replaced this morning and tube feeds were resumed. He is tolerating.  Enteral Access: 16 Fr. NGT replaced 6/4; terminates in mid stomach per abdominal x-ray 6/4  TF: Osmolite at 60 mL/hr + Pro-Stat 30 mL BID + FWF 30 mL Q4hrs  Medications reviewed and include: Juven BID, D5W at 50 mL/hr, famotidine, Keppra.   Labs reviewed: CBG 101-117, BUN 24.  Diet Order:   Diet Order            Diet NPO time specified  Diet effective now             EDUCATION NEEDS:   No education needs have been identified at this time  Skin:  Skin  Assessment: Skin Integrity Issues:(stg II coccyx early/partial granulation; MSAD to buttocks)  Last BM:  10/31/2018 - small type 7  Height:   Ht Readings from Last 1 Encounters:  10/23/18 6' 0.01" (1.829 m)   Weight:   Wt Readings from Last 1 Encounters:  10/27/18 100.4 kg   Ideal Body Weight:  80.9 kg  BMI:  Body mass index is 30.01 kg/m.  Estimated Nutritional Needs:   Kcal:  2300-2600kcal/day   Protein:  115-130g/day   Fluid:  2 L/day (25 mL/kg IBW)  Willey Blade, MS, RD, LDN Office: [phone] Pager: [phone] After Hours/Weekend Pager: [phone]

## 2018-10-31 NOTE — Progress Notes (Signed)
Subjective: Patient more alert today.  Objective: Current vital signs: BP 125/69 (BP Location: Right Arm)   Pulse (!) 102   Temp 98.7 F (37.1 C) (Oral)   Resp 18   Ht 6' 0.01" (1.829 m)   Wt 100.4 kg   SpO2 97%   BMI 30.01 kg/m  Vital signs in last 24 hours: Temp:  [98.5 F (36.9 C)-99.8 F (37.7 C)] 98.7 F (37.1 C) (06/04 0555) Pulse Rate:  [102-116] 102 (06/04 0555) Resp:  [18] 18 (06/04 0555) BP: (125-150)/(69-118) 125/69 (06/04 0555) SpO2:  [97 %] 97 % (06/04 0555)  Intake/Output from previous day: 06/03 0701 - 06/04 0700 In: 1456 [I.V.:1022.3; IV Piggyback:433.7] Out: 3700 [Urine:3700] Intake/Output this shift: No intake/output data recorded. Nutritional status:  Diet Order            Diet NPO time specified  Diet effective now              Neurologic Exam: Mental Status: Patient opens eyes with light sternal rub.  Localizes to pain with LUE.  Does not follow commands Cranial Nerves: II: patient does not respond confrontation bilaterally, pupils right 3 mm, left 3 mm,and reactive bilaterally III,IV,VI: doll's response present bilaterally.  V,VII: corneal reflex present bilaterally  VIII: patient does not respond to verbal stimuli IX,X: gag reflex not tested, XI: trapezius strength unable to test bilaterally XII: tongue strength unable to test Motor: Moves both upper extremities, left greater than right.  Minimal movement in lower extremities noted Sensory: Respond to noxious stimuli in both upper extremities.   Lab Results: Basic Metabolic Panel: Recent Labs  Lab 10/25/18 1819 10/26/18 0409  10/27/18 0522 10/28/18 0511 10/29/18 0250 10/30/18 0528 10/31/18 0240  NA  --  142  --  142 143 140 137 138  K 3.1* 3.0*   < > 3.6 3.3* 3.8 4.3 4.5  CL  --  111  --  111 112* 109 105 103  CO2  --  23  --  22 22 23 24 25   GLUCOSE  --  142*  --  141* 113* 135* 114* 116*  BUN  --  20  --  16 13 14 16  24*  CREATININE  --  0.73  --  0.62 0.75 0.79 0.69  0.74  CALCIUM  --  6.6*  --  7.6* 8.1* 8.1* 8.3* 9.0  MG  --  1.7  --  1.9 1.8 2.0 1.6* 1.8  PHOS <1.0* 2.4*  --  2.6 2.5  --  3.0  --    < > = values in this interval not displayed.    Liver Function Tests: No results for input(s): AST, ALT, ALKPHOS, BILITOT, PROT, ALBUMIN in the last 168 hours. No results for input(s): LIPASE, AMYLASE in the last 168 hours. No results for input(s): AMMONIA in the last 168 hours.  CBC: Recent Labs  Lab 10/25/18 0302 10/26/18 0409 10/29/18 0250  WBC 10.1 11.0* 13.2*  NEUTROABS 6.9 7.8* 8.6*  HGB 8.6* 9.2* 10.1*  HCT 27.6* 30.0* 32.0*  MCV 86.0 86.2 85.3  PLT 287 293 356    Cardiac Enzymes: No results for input(s): CKTOTAL, CKMB, CKMBINDEX, TROPONINI in the last 168 hours.  Lipid Panel: No results for input(s): CHOL, TRIG, HDL, CHOLHDL, VLDL, LDLCALC in the last 168 hours.  CBG: Recent Labs  Lab 10/30/18 1647 10/30/18 2141 10/31/18 0058 10/31/18 0413 10/31/18 0745  GLUCAP 126* 139* 125* 101* 117*    Microbiology: Results for orders placed or performed during  the hospital encounter of 10/19/18  SARS Coronavirus 2 (CEPHEID - Performed in [facility] hospital lab), Hosp Order     Status: None   Collection Time: 10/19/18  9:28 AM  Result Value Ref Range Status   SARS Coronavirus 2 NEGATIVE NEGATIVE Final    Comment: (NOTE) If result is NEGATIVE SARS-CoV-2 target nucleic acids are NOT DETECTED. The SARS-CoV-2 RNA is generally detectable in upper and lower  respiratory specimens during the acute phase of infection. The lowest  concentration of SARS-CoV-2 viral copies this assay can detect is 250  copies / mL. A negative result does not preclude SARS-CoV-2 infection  and should not be used as the sole basis for treatment or other  patient management decisions.  A negative result may occur with  improper specimen collection / handling, submission of specimen other  than nasopharyngeal swab, presence of viral mutation(s) within the   areas targeted by this assay, and inadequate number of viral copies  (<250 copies / mL). A negative result must be combined with clinical  observations, patient history, and epidemiological information. If result is POSITIVE SARS-CoV-2 target nucleic acids are DETECTED. The SARS-CoV-2 RNA is generally detectable in upper and lower  respiratory specimens dur ing the acute phase of infection.  Positive  results are indicative of active infection with SARS-CoV-2.  Clinical  correlation with patient history and other diagnostic information is  necessary to determine patient infection status.  Positive results do  not rule out bacterial infection or co-infection with other viruses. If result is PRESUMPTIVE POSTIVE SARS-CoV-2 nucleic acids MAY BE PRESENT.   A presumptive positive result was obtained on the submitted specimen  and confirmed on repeat testing.  While 2019 novel coronavirus  (SARS-CoV-2) nucleic acids may be present in the submitted sample  additional confirmatory testing may be necessary for epidemiological  and / or clinical management purposes  to differentiate between  SARS-CoV-2 and other Sarbecovirus currently known to infect humans.  If clinically indicated additional testing with an alternate test  methodology [phone]) is advised. The SARS-CoV-2 RNA is generally  detectable in upper and lower respiratory sp ecimens during the acute  phase of infection. The expected result is Negative. Fact Sheet for Patients:  StrictlyIdeas.no Fact Sheet for Healthcare Providers: BankingDealers.co.za This test is not yet approved or cleared by the [place] FDA and has been authorized for detection and/or diagnosis of SARS-CoV-2 by FDA under an Emergency Use Authorization (EUA).  This EUA will remain in effect (meaning this test can be used) for the duration of the COVID-19 declaration under Section 564(b)(1) of the Act, 21  U.S.C. section 360bbb-3(b)(1), unless the authorization is terminated or revoked sooner. Performed at [facility], [address] [place], [place] [address]   Blood culture (routine x 2)     Status: None   Collection Time: 10/19/18  9:36 AM  Result Value Ref Range Status   Specimen Description BLOOD LUA  Final   Special Requests   Final    BOTTLES DRAWN AEROBIC AND ANAEROBIC Blood Culture adequate volume   Culture   Final    NO GROWTH 5 DAYS Performed at [facility], [address], [place], [place] [address]    Report Status 10/24/2018 FINAL  Final  Blood culture (routine x 2)     Status: None   Collection Time: 10/19/18 10:16 AM  Result Value Ref Range Status   Specimen Description BLOOD LFA  Final   Special Requests   Final  BOTTLES DRAWN AEROBIC AND ANAEROBIC Blood Culture results may not be optimal due to an inadequate volume of blood received in culture bottles   Culture   Final    NO GROWTH 5 DAYS Performed at [facility], [address] [place], [place] [address]    Report Status 10/24/2018 FINAL  Final  MRSA PCR Screening     Status: Abnormal   Collection Time: 10/19/18  1:06 PM  Result Value Ref Range Status   MRSA by PCR POSITIVE (A) NEGATIVE Final    Comment:        The GeneXpert MRSA Assay (FDA approved for NASAL specimens only), is one component of a comprehensive MRSA colonization surveillance program. It is not intended to diagnose MRSA infection nor to guide or monitor treatment for MRSA infections. CRITICAL RESULT CALLED TO, READ BACK BY AND VERIFIED WITH: CALLED TO TANYA SILVA @1426  10/19/2018 [facility] Performed at [facility], [address], [place], [place] [address]   Urine Culture     Status: None   Collection Time: 10/21/18  4:10 PM  Result Value Ref Range Status   Specimen Description   Final    URINE, CATHETERIZED Performed at [facility], [address] [place], [place] [address]     Special Requests   Final    Normal Performed at [facility], [address] [place], [place] [address]    Culture   Final    NO GROWTH Performed at [facility], [place] [address] [place], [place] [address]    Report Status 10/22/2018 FINAL  Final    Coagulation Studies: No results for input(s): LABPROT, INR in the last 72 hours.  Imaging: Dg Chest Port 1 View  Result Date: 10/31/2018 CLINICAL DATA:  Dyspnea EXAM: PORTABLE CHEST 1 VIEW COMPARISON:  10/25/2018 FINDINGS: The trachea esophagus have been extubated. Mild cardiomegaly, stable. Mildly low lung volumes. There is no edema, consolidation, effusion, or pneumothorax. Haziness at the peripheral left chest is likely from soft tissue attenuation. There is an implantable loop recorder. IMPRESSION: No evidence of acute disease. Electronically Signed   By: Monte Fantasia M.D.   On: 10/31/2018 04:34   Dg Abd Portable 1v  Result Date: 10/31/2018 CLINICAL DATA:  Check gastric catheter placement EXAM: PORTABLE ABDOMEN - 1 VIEW COMPARISON:  10/28/2018 FINDINGS: Gastric catheter has been advanced slightly further into the stomach. No other focal abnormality is noted. IMPRESSION: Gastric catheter within the mid stomach. Electronically Signed   By: Inez Catalina M.D.   On: 10/31/2018 09:50    Medications:  I have reviewed the patient's current medications. Scheduled: . chlorhexidine  15 mL Mouth Rinse BID  . Chlorhexidine Gluconate Cloth  6 each Topical Q0600  . feeding supplement (PRO-STAT SUGAR FREE 64)  30 mL Per Tube BID  . free water  30 mL Per Tube Q4H  . heparin  5,000 Units Subcutaneous Q8H  . mouth rinse  15 mL Mouth Rinse q12n4p  . modafinil  200 mg Per Tube BID  . nutrition supplement (JUVEN)  1 packet Per Tube BID BM    Assessment/Plan: Patient some improved today but remains markedly impaired.  Appears to be tolerating increased dose of Modafinil.  Recommendations: 1. Continue Modafinil at current dose    LOS: 12 days   Alexis Goodell, MD Neurology [phone] 10/31/2018  11:05 AM

## 2018-10-31 NOTE — Evaluation (Signed)
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 who presented from his facility unresponsive requiring him to be ventilated in the ED. Patient has been seen in previous years at behavioral health for suicide ideation.    Clinical Impression   Pt seen for OT evaluation this date. Per chart review, prior to hospital admission, pt was receiving rehab at a SNF to improve mobility and had begun to stand with therapy as well as able to feed himself. Currently pt demonstrates significant impairments in cognition (unable to follow any commands or able to attend to therapist, responding only to cold or noxious stimuli) and requiring total assist for all ADL and at least +2 for any functional mobility attempts. BUE PROM to minimize risk of joint stiffness and contracture. Resistance met with LUE elbow extension past 75% full range. Pt would benefit from skilled OT to address noted impairments and functional limitations (see below for any additional details) in order to maximize safety and independence while minimizing falls risk and caregiver burden.  Upon hospital discharge, recommend pt discharge to [facility] vs LTACH.    Follow Up Recommendations  SNF;LTACH    Equipment Recommendations  None recommended by OT    Recommendations for Other Services       Precautions / Restrictions Precautions Precautions: Fall Restrictions Weight Bearing Restrictions: No      Mobility Bed Mobility Overal bed mobility: Needs Assistance             General bed mobility comments: Total A x2 for any bed mobility  Transfers                 General transfer comment: not safe at this time    Balance        Sitting balance - Comments: not able to assess, dependent transfers                                   ADL either performed or assessed with clinical judgement   ADL Overall ADL's : Needs assistance/impaired                                       General ADL Comments: total dependence for all ADL, currently NPO     Vision Patient Visual Report: Other (comment)(unable to assess, pt unable to state) Vision Assessment?: Vision impaired- to be further tested in functional context Additional Comments: Pt does not visually track therapist, unable to formally assess 2/2 pt unable to follow any commands, lethargic     Perception     Praxis      Pertinent Vitals/Pain Pain Assessment: Faces Faces Pain Scale: No hurt Pain Intervention(s): Monitored during session     Hand Dominance Right   Extremity/Trunk Assessment Upper Extremity Assessment Upper Extremity Assessment: Difficult to assess due to impaired cognition;RUE deficits/detail;LUE deficits/detail RUE Deficits / Details: full PROM, some muscle activation noted but unable to sustain, 1/5 grossly, Hand/fingers tend to be in flexed position LUE Deficits / Details: full PROM except significant resistence noted with elbow extension past 75% of full range, 1/5 grossly,  hand/fingers tend to be in flexed position   Lower Extremity Assessment Lower Extremity Assessment: Difficult to assess due to impaired cognition(high tone in BLE, able to wiggle R great toe)       Communication Communication Communication: HOH   Cognition Arousal/Alertness: Lethargic Behavior During Therapy: Flat affect Overall Cognitive Status: Difficult to assess                                 General Comments: Pt unable to keep eyes open despite max cues, responds to hot/cold stimuli, unable to follow commands   General Comments  NG tube in place, mitts on hands at start and end of session    Exercises Other  Exercises Other Exercises: BUE PROM to minimize risk of joint stiffness and contracture   Shoulder Instructions      Home Living Family/patient expects to be discharged to:: Skilled nursing facility                                 Additional Comments: Per previous documentation: Patient admitted from SNF facility. Was admitted to [facility] after head tracheostomy then went to [facility] then went to a skilled nursing      Prior Functioning/Environment Level of Independence: Needs assistance  Gait / Transfers Assistance Needed: Per previous documentation patient was primarily w/c bound but with extensive PT he had began to stand independently  ADL's / Homemaking Assistance Needed: Per previous documentation patient was able to feed himself   Comments: Per previous documentation patient's baseline is alert and oriented, able to feed self, self propel wheelchair and recently began standing independently         OT Problem List: Decreased strength;Decreased range of motion;Decreased coordination;Impaired tone;Decreased knowledge of use of DME or AE;Impaired vision/perception;Decreased activity tolerance;Decreased cognition;Impaired UE functional use;Impaired balance (sitting and/or standing);Decreased safety awareness      OT Treatment/Interventions: Self-care/ADL training;Balance training;Therapeutic exercise;Therapeutic activities;Neuromuscular education;DME and/or AE instruction;Cognitive remediation/compensation;Visual/perceptual remediation/compensation;Patient/family education;Manual therapy    OT Goals(Current goals can be found in the care plan section) Acute Rehab OT Goals OT Goal Formulation: Patient unable to participate in goal setting ADL Goals Pt Will Perform Eating: with max assist;bed level(bring utensil to mouth in preparation for self feeding with Max A) Pt Will Perform Grooming: with max assist;bed level Additional ADL Goal #1: Pt will utilize eye gaze to indicate  pt's needs with max cues and communication aides as needed.  OT Frequency: Min 1X/week   Barriers to D/C:            Co-evaluation              AM-PAC OT "6 Clicks" Daily Activity     Outcome Measure Help from another person eating meals?: Total Help from another person taking care of personal grooming?: Total Help from another person toileting, which includes using toliet, bedpan, or urinal?: Total Help from another person bathing (including washing, rinsing, drying)?: Total Help from another person to put on and taking off regular upper body clothing?: Total Help from another person to put on and taking off regular lower body clothing?: Total 6 Click Score: 6   End of Session    Activity Tolerance: Patient tolerated treatment well Patient left: in bed;with call bell/phone within reach;with bed alarm set  OT Visit Diagnosis: Other abnormalities of gait and mobility (R26.89);Cognitive communication deficit (R41.841);Muscle weakness (generalized) (M62.81) Symptoms  and signs involving cognitive functions: Other cerebrovascular disease                Time: 5872-7618 OT Time Calculation (min): 13 min Charges:  OT General Charges $OT Visit: 1 Visit OT Evaluation $OT Eval High Complexity: 1 High  Jeni Salles, MPH, MS, OTR/L ascom [phone] 10/31/18, 9:12 AM

## 2018-10-31 NOTE — Progress Notes (Signed)
Allen Darby, MD [address]  [place]  [place], [place] [address]  Main: [phone]  Fax: [phone] Pager: [phone]   Subjective: Patient is transferred to the floor.  He has been hemodynamically stable.  He has been tolerating tube feeds well.  He pulled out the NG tube twice last night.  Currently on tube feeds via NG tube at 60 mils per hour.  Per patient's nurse, he has been having bowel movements and tolerating tube feeds well.   Objective: Vital signs in last 24 hours: Vitals:   10/30/18 1529 10/30/18 1959 10/31/18 0555 10/31/18 1517  BP: (!) 148/83 (!) 150/118 125/69 (!) 153/78  Pulse: (!) 105 (!) 116 (!) 102 (!) 116  Resp: 18 18 18 20   Temp: 98.5 F (36.9 C) 99.8 F (37.7 C) 98.7 F (37.1 C) 98.6 F (37 C)  TempSrc: Oral Oral Oral Oral  SpO2: 97% 97% 97%   Weight:      Height:       Weight change:   Intake/Output Summary (Last 24 hours) at 10/31/2018 1849 Last data filed at 10/31/2018 1824 Gross per 24 hour  Intake -  Output 2750 ml  Net -2750 ml     Exam: Heart:: Regular rate and rhythm or S1S2 present Lungs: normal and clear to auscultation Abdomen: soft, nontender, normal bowel sounds   Lab Results: CBC Latest Ref Rng & Units 10/29/2018 10/26/2018 10/25/2018  WBC 4.0 - 10.5 K/uL 13.2(H) 11.0(H) 10.1  Hemoglobin 13.0 - 17.0 g/dL 10.1(L) 9.2(L) 8.6(L)  Hematocrit 39.0 - 52.0 % 32.0(L) 30.0(L) 27.6(L)  Platelets 150 - 400 K/uL 356 293 287   CMP Latest Ref Rng & Units 10/31/2018 10/30/2018 10/29/2018  Glucose 70 - 99 mg/dL 116(H) 114(H) 135(H)  BUN 8 - 23 mg/dL 24(H) 16 14  Creatinine 0.61 - 1.24 mg/dL 0.74 0.69 0.79  Sodium 135 - 145 mmol/L 138 137 140  Potassium 3.5 - 5.1 mmol/L 4.5 4.3 3.8  Chloride 98 - 111 mmol/L 103 105 109  CO2 22 - 32 mmol/L 25 24 23   Calcium 8.9 - 10.3 mg/dL 9.0 8.3(L) 8.1(L)  Total Protein 6.5 - 8.1 g/dL - - -  Total Bilirubin 0.3 - 1.2 mg/dL - - -  Alkaline Phos 38 - 126 U/L - - -  AST 15 - 41 U/L - - -   ALT 0 - 44 U/L - - -    Micro Results: No results found for this or any previous visit (from the past 240 hour(s)). Studies/Results: Dg Abd 1 View  Result Date: 10/31/2018 CLINICAL DATA:  NG tube placement. EXAM: ABDOMEN - 1 VIEW COMPARISON:  10/31/2018. FINDINGS: NG tube noted with tip over the stomach. Cardiac monitor device noted over the chest. Left base atelectatic changes. Small left pleural effusion cannot be excluded. IMPRESSION: 1.  NG tube noted with tip over the stomach. 2. Left base atelectatic changes. Small left pleural effusion cannot be excluded. Electronically Signed   By: Marcello Moores  Register   On: 10/31/2018 17:06   Dg Chest Port 1 View  Result Date: 10/31/2018 CLINICAL DATA:  Dyspnea EXAM: PORTABLE CHEST 1 VIEW COMPARISON:  10/25/2018 FINDINGS: The trachea esophagus have been extubated. Mild cardiomegaly, stable. Mildly low lung volumes. There is no edema, consolidation, effusion, or pneumothorax. Haziness at the peripheral left chest is likely from soft tissue attenuation. There is an implantable loop recorder. IMPRESSION: No evidence of acute disease. Electronically Signed   By: Monte Fantasia M.D.   On:  10/31/2018 04:34   Dg Abd Portable 1v  Result Date: 10/31/2018 CLINICAL DATA:  Check gastric catheter placement EXAM: PORTABLE ABDOMEN - 1 VIEW COMPARISON:  10/28/2018 FINDINGS: Gastric catheter has been advanced slightly further into the stomach. No other focal abnormality is noted. IMPRESSION: Gastric catheter within the mid stomach. Electronically Signed   By: Inez Catalina M.D.   On: 10/31/2018 09:50   Medications:  I have reviewed the patient's current medications. Prior to Admission:  Medications Prior to Admission  Medication Sig Dispense Refill Last Dose  . albuterol (PROVENTIL) (2.5 MG/3ML) 0.083% nebulizer solution Take 2.5 mg by nebulization every 4 (four) hours as needed for wheezing or shortness of breath.   prn at prn  . amLODipine (NORVASC) 10 MG tablet Take 1  tablet (10 mg total) by mouth daily. 30 tablet 0 10/18/2018 at Unknown time  . atorvastatin (LIPITOR) 40 MG tablet Take 40 mg by mouth every evening.   10/18/2018 at Unknown time  . carvedilol (COREG) 25 MG tablet Take 25 mg by mouth 2 (two) times daily with a meal.   10/18/2018 at Unknown time  . doxazosin (CARDURA) 2 MG tablet Take 2 mg by mouth at bedtime.   10/18/2018 at Unknown time  . DULoxetine (CYMBALTA) 20 MG capsule Take 1 capsule (20 mg total) by mouth daily. 90 capsule 1 10/18/2018 at Unknown time  . DULoxetine (CYMBALTA) 60 MG capsule Take 1 capsule (60 mg total) by mouth daily. 90 capsule 1 10/18/2018 at Unknown time  . famotidine (PEPCID) 20 MG tablet Take 20 mg by mouth 2 (two) times daily.   10/18/2018 at Unknown time  . gabapentin (NEURONTIN) 300 MG/6ML solution Take 300 mg by mouth 3 (three) times daily.   10/18/2018 at Unknown time  . glipiZIDE (GLUCOTROL) 5 MG tablet Take 5 mg by mouth 2 (two) times daily.   10/18/2018 at Unknown time  . levETIRAcetam (KEPPRA) 750 MG tablet Take 750 mg by mouth 2 (two) times daily.   10/18/2018 at Unknown time  . lisinopril (PRINIVIL,ZESTRIL) 40 MG tablet Take 40 mg by mouth daily.   10/18/2018 at Unknown time  . Melatonin 3 MG TABS Take 3 mg by mouth at bedtime.   10/18/2018 at Unknown time   Scheduled: . chlorhexidine  15 mL Mouth Rinse BID  . Chlorhexidine Gluconate Cloth  6 each Topical Q0600  . feeding supplement (PRO-STAT SUGAR FREE 64)  30 mL Per Tube BID  . free water  150 mL Per Tube Q4H  . heparin  5,000 Units Subcutaneous Q8H  . mouth rinse  15 mL Mouth Rinse q12n4p  . modafinil  200 mg Per Tube BID  . nutrition supplement (JUVEN)  1 packet Per Tube BID BM   Continuous: . sodium chloride Stopped (10/30/18 1116)  . dextrose 50 mL/hr at 10/30/18 1545  . famotidine (PEPCID) IV Stopped (10/31/18 1042)  . feeding supplement (OSMOLITE 1.5 CAL) 1,000 mL (10/30/18 1010)  . levETIRAcetam 500 mg (10/31/18 1045)   CWC:BJSEGB chloride,  acetaminophen, polyethylene glycol Anti-infectives (From admission, onward)   Start     Dose/Rate Route Frequency Ordered Stop   10/23/18 0600  ceFEPIme (MAXIPIME) 2 g in sodium chloride 0.9 % 100 mL IVPB  Status:  Discontinued     2 g 200 mL/hr over 30 Minutes Intravenous Every 8 hours 10/23/18 0500 10/24/18 1610   10/22/18 0000  ceFEPIme (MAXIPIME) 2 g in sodium chloride 0.9 % 100 mL IVPB  Status:  Discontinued  2 g 200 mL/hr over 30 Minutes Intravenous Every 24 hours 10/21/18 1338 10/23/18 0500   10/20/18 1000  ceFEPIme (MAXIPIME) 1 g in sodium chloride 0.9 % 100 mL IVPB  Status:  Discontinued     1 g 200 mL/hr over 30 Minutes Intravenous Every 12 hours 10/20/18 0822 10/21/18 1338   10/19/18 1800  ceFEPIme (MAXIPIME) 1 g in sodium chloride 0.9 % 100 mL IVPB  Status:  Discontinued     1 g 200 mL/hr over 30 Minutes Intravenous Every 24 hours 10/19/18 1215 10/20/18 0822   10/19/18 0930  vancomycin (VANCOCIN) IVPB 1000 mg/200 mL premix     1,000 mg 200 mL/hr over 60 Minutes Intravenous  Once 10/19/18 0915 10/19/18 1154   10/19/18 0930  piperacillin-tazobactam (ZOSYN) IVPB 3.375 g     3.375 g 100 mL/hr over 30 Minutes Intravenous  Once 10/19/18 0915 10/19/18 1050     Scheduled Meds: . chlorhexidine  15 mL Mouth Rinse BID  . Chlorhexidine Gluconate Cloth  6 each Topical Q0600  . feeding supplement (PRO-STAT SUGAR FREE 64)  30 mL Per Tube BID  . free water  150 mL Per Tube Q4H  . heparin  5,000 Units Subcutaneous Q8H  . mouth rinse  15 mL Mouth Rinse q12n4p  . modafinil  200 mg Per Tube BID  . nutrition supplement (JUVEN)  1 packet Per Tube BID BM   Continuous Infusions: . sodium chloride Stopped (10/30/18 1116)  . dextrose 50 mL/hr at 10/30/18 1545  . famotidine (PEPCID) IV Stopped (10/31/18 1042)  . feeding supplement (OSMOLITE 1.5 CAL) 1,000 mL (10/30/18 1010)  . levETIRAcetam 500 mg (10/31/18 1045)   PRN Meds:.sodium chloride, acetaminophen, polyethylene glycol    Assessment: Active Problems:   Sepsis ([place])   Pressure injury of skin  Patient is seen by neurology, expect very slow recovery  Plan: We will attempt gastrostomy tube placement by EGD tomorrow Hold tube feeds tonight after midnight Ancef prior to PEG placement    LOS: 12 days   Rohini Vanga 10/31/2018, 6:49 PM

## 2018-10-31 NOTE — Progress Notes (Addendum)
[place] at [place] NAME: [name]    MR#:  [id number]  DATE OF BIRTH:  05-21-1956  SUBJECTIVE:  CHIEF COMPLAINT:   Chief Complaint  Patient presents with  . Altered Mental Status   -Remains about the same.  Alert, pulled out his NG tube twice since last night.  Not following simple commands  REVIEW OF SYSTEMS:  Review of Systems  Unable to perform ROS: Mental status change    DRUG ALLERGIES:   Allergies  Allergen Reactions  . Metformin Diarrhea    VITALS:  Blood pressure 125/69, pulse (!) 102, temperature 98.7 F (37.1 C), temperature source Oral, resp. rate 18, height 6' 0.01" (1.829 m), weight 100.4 kg, SpO2 97 %.  PHYSICAL EXAMINATION:  Physical Exam   GENERAL:  63 y.o.-year-old ill-appearing patient lying in the bed with no acute distress.  EYES: Pupils equal, round, reactive to light and accommodation. No scleral icterus. Extraocular muscles intact.  Has a right-sided gaze HEENT: Head atraumatic, normocephalic. Oropharynx and nasopharynx clear.  NECK:  Supple, no jugular venous distention. No thyroid enlargement, no tenderness.  LUNGS: Normal breath sounds bilaterally, no wheezing, rales,rhonchi or crepitation. No use of accessory muscles of respiration.  CARDIOVASCULAR: S1, S2 normal. No murmurs, rubs, or gallops.  ABDOMEN: Soft, nontender, nondistended. Bowel sounds present. No organomegaly or mass.  EXTREMITIES: No pedal edema, cyanosis, or clubbing.  NEUROLOGIC: Cranial nerves II through XII are intact.  Tracking objects today.  Not following any commands.  Alert but not verbal.   Sensation intact. Gait not checked.  -Responding to touch, withdrawing.  Has 2+ symmetric bilateral lower extremity reflexes PSYCHIATRIC: The patient is alert but not oriented, remains nonverbal SKIN: No obvious rash, lesion, or ulcer.    LABORATORY PANEL:   CBC Recent Labs  Lab 10/29/18 0250  WBC 13.2*  HGB 10.1*  HCT 32.0*   PLT 356   ------------------------------------------------------------------------------------------------------------------  Chemistries  Recent Labs  Lab 10/31/18 0240  NA 138  K 4.5  CL 103  CO2 25  GLUCOSE 116*  BUN 24*  CREATININE 0.74  CALCIUM 9.0  MG 1.8   ------------------------------------------------------------------------------------------------------------------  Cardiac Enzymes No results for input(s): TROPONINI in the last 168 hours. ------------------------------------------------------------------------------------------------------------------  RADIOLOGY:  Dg Chest Port 1 View  Result Date: 10/31/2018 CLINICAL DATA:  Dyspnea EXAM: PORTABLE CHEST 1 VIEW COMPARISON:  10/25/2018 FINDINGS: The trachea esophagus have been extubated. Mild cardiomegaly, stable. Mildly low lung volumes. There is no edema, consolidation, effusion, or pneumothorax. Haziness at the peripheral left chest is likely from soft tissue attenuation. There is an implantable loop recorder. IMPRESSION: No evidence of acute disease. Electronically Signed   By: [name] M.D.   On: 10/31/2018 04:34   Dg Abd Portable 1v  Result Date: 10/31/2018 CLINICAL DATA:  Check gastric catheter placement EXAM: PORTABLE ABDOMEN - 1 VIEW COMPARISON:  10/28/2018 FINDINGS: Gastric catheter has been advanced slightly further into the stomach. No other focal abnormality is noted. IMPRESSION: Gastric catheter within the mid stomach. Electronically Signed   By: [name] M.D.   On: 10/31/2018 09:50    EKG:   Orders placed or performed in visit on 04/13/17  . 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  1.  Acute encephalopathy-initially secondary to  hypoglycemia, currently extubated and sugars are improved. -Has bilateral encephalomalacia based on CT head.  Prior to left frontal craniotomy done.  Prior history of strokes. -Has right anterior cerebral artery aneurysm embolization in the past and clipping recently this year. -MRI cannot be done due to recent clipping. -Appreciate neurology consult.  Provigil dose has been increased without significant change.  Remains alert but not following commands. -Appreciate neurology consult  2.  Sepsis-secondary to urinary tract infection.  All cultures have been negative.  Off pressors.  Off steroids. --Finished broad-spectrum antibiotics  3.  Acute renal failure-secondary to acute urinary retention with history of BPH -Appreciate nephrology consult.  Bilateral hydronephrosis on renal ultrasound.  Status post Foley cath -Renal function is normalized at this time  4.  Poor oral intake-started on tube feeds via NG tube.  However.  NG tube has been pulled out twice for now. -Awaiting palliative care input, if daughter is okay with PEG tube may be time to consider permanent feeding with PEG tube. -GI has been consulted for PEG tube.  However given acute mental status changes and new stroke-GI recommended awaiting to see neurological recovery prior to placing a G-tube. -  Patient had a G-tube and tracheostomy which was recently removed couple of months ago and patient was eating by mouth.  Continue to monitor at this time.  5.  Seizure disorder-prophylactically on Keppra.  6.  DVT prophylaxis-subcutaneous heparin   Physical therapy consult-need for SNF versus LTAC Palliative care has been consulted Updated daughter over the phone yesterday  All the records are reviewed and case discussed with Care Management/Social Workerr. Management plans discussed with the patient, family and they are in agreement.  CODE STATUS: Full code  TOTAL TIME TAKING CARE OF THIS PATIENT: 33 minutes.   POSSIBLE D/C  IN ?  DAYS, DEPENDING ON CLINICAL CONDITION.   [name] M.D on 10/31/2018 at 11:31 AM  Between 7am to 6pm - Pager - [phone]  After 6pm go to [email or web address] - password [email or web address] [facility]  Office  [phone]  CC: Primary care physician; Allen Brothers, MD

## 2018-10-31 NOTE — Progress Notes (Signed)
Pharmacy Electrolyte Monitoring Consult:  Pharmacy consulted to assist in monitoring and replacing electrolytes in this 63 y.o. male admitted on 10/19/2018 with Altered Mental Status   Labs:  Sodium (mmol/L)  Date Value  10/31/2018 138   Potassium (mmol/L)  Date Value  10/31/2018 4.5   Magnesium (mg/dL)  Date Value  10/31/2018 1.8   Phosphorus (mg/dL)  Date Value  10/30/2018 3.0   Calcium (mg/dL)  Date Value  10/31/2018 9.0   Albumin (g/dL)  Date Value  10/21/2018 2.4 (L)   Corrected Calcium: 9.38  Assessment/Plan: K=4.3, Mag = 1.8, Phos = 3.0  No electrolyte replenishment warranted at this time.  Patient is on Ada.   Will replace for goal potassium ~4, goal magnesium ~ 2, and goal phosphorus ~ 2.5.   Will check Mag, Phos, and K with am labs   Pharmacy will continue to monitor and adjust per consult.    [name], PharmD, BCPS Clinical Pharmacist 10/31/2018 7:15 AM

## 2018-11-01 ENCOUNTER — Encounter
Admission: EM | Disposition: A | Discharge status: Skilled Nursing Facility | Payer: Self-pay | Source: Home / Self Care | Attending: Internal Medicine

## 2018-11-01 ENCOUNTER — Inpatient Hospital Stay: Payer: Medicare HMO | Admitting: Anesthesiology

## 2018-11-01 ENCOUNTER — Encounter: Payer: Self-pay | Admitting: Anesthesiology

## 2018-11-01 DIAGNOSIS — R633 Feeding difficulties: Secondary | ICD-10-CM

## 2018-11-01 DIAGNOSIS — K3189 Other diseases of stomach and duodenum: Secondary | ICD-10-CM

## 2018-11-01 DIAGNOSIS — Z431 Encounter for attention to gastrostomy: Secondary | ICD-10-CM

## 2018-11-01 DIAGNOSIS — R29818 Other symptoms and signs involving the nervous system: Secondary | ICD-10-CM

## 2018-11-01 DIAGNOSIS — K259 Gastric ulcer, unspecified as acute or chronic, without hemorrhage or perforation: Secondary | ICD-10-CM

## 2018-11-01 DIAGNOSIS — R1310 Dysphagia, unspecified: Secondary | ICD-10-CM

## 2018-11-01 HISTORY — PX: PEG PLACEMENT: SHX5437

## 2018-11-01 LAB — LACTIC ACID, PLASMA
Lactic Acid, Venous: 1.5 mmol/L (ref 0.5–1.9)
Lactic Acid, Venous: 2 mmol/L (ref 0.5–1.9)

## 2018-11-01 LAB — GLUCOSE, CAPILLARY
Glucose-Capillary: 109 mg/dL — ABNORMAL HIGH (ref 70–99)
Glucose-Capillary: 111 mg/dL — ABNORMAL HIGH (ref 70–99)
Glucose-Capillary: 117 mg/dL — ABNORMAL HIGH (ref 70–99)
Glucose-Capillary: 129 mg/dL — ABNORMAL HIGH (ref 70–99)
Glucose-Capillary: 129 mg/dL — ABNORMAL HIGH (ref 70–99)

## 2018-11-01 LAB — BASIC METABOLIC PANEL
Anion gap: 12 (ref 5–15)
BUN: 18 mg/dL (ref 8–23)
CO2: 24 mmol/L (ref 22–32)
Calcium: 8.8 mg/dL — ABNORMAL LOW (ref 8.9–10.3)
Chloride: 100 mmol/L (ref 98–111)
Creatinine, Ser: 0.86 mg/dL (ref 0.61–1.24)
GFR calc Af Amer: >60 mL/min (ref >60)
GFR calc non Af Amer: >60 mL/min (ref >60)
Glucose, Bld: 156 mg/dL — ABNORMAL HIGH (ref 70–99)
Potassium: 4 mmol/L (ref 3.5–5.1)
Sodium: 136 mmol/L (ref 135–145)

## 2018-11-01 LAB — CBC
HCT: 33.7 % — ABNORMAL LOW (ref 39.0–52.0)
Hemoglobin: 10.8 g/dL — ABNORMAL LOW (ref 13.0–17.0)
MCH: 27.2 pg (ref 26.0–34.0)
MCHC: 32 g/dL (ref 30.0–36.0)
MCV: 84.9 fL (ref 80.0–100.0)
Platelets: 404 10*3/uL — ABNORMAL HIGH (ref 150–400)
RBC: 3.97 MIL/uL — ABNORMAL LOW (ref 4.22–5.81)
RDW: 16.9 % — ABNORMAL HIGH (ref 11.5–15.5)
WBC: 12.8 10*3/uL — ABNORMAL HIGH (ref 4.0–10.5)
nRBC: 0 % (ref 0.0–0.2)

## 2018-11-01 LAB — MAGNESIUM: Magnesium: 1.7 mg/dL (ref 1.7–2.4)

## 2018-11-01 LAB — PHOSPHORUS: Phosphorus: 4.3 mg/dL (ref 2.5–4.6)

## 2018-11-01 SURGERY — INSERTION, PEG TUBE
Anesthesia: General

## 2018-11-01 MED ORDER — SODIUM CHLORIDE 0.9 % IV BOLUS
500.0000 mL | Freq: Once | INTRAVENOUS | Status: AC
  Administered 2018-11-01: 500 mL via INTRAVENOUS

## 2018-11-01 MED ORDER — PROPOFOL 500 MG/50ML IV EMUL
INTRAVENOUS | Status: DC | PRN
  Administered 2018-11-01: 100 ug/kg/min via INTRAVENOUS

## 2018-11-01 MED ORDER — METOPROLOL TARTRATE 25 MG PO TABS
25.0000 mg | ORAL_TABLET | Freq: Two times a day (BID) | ORAL | Status: DC
  Administered 2018-11-01 – 2018-11-04 (×6): 25 mg
  Filled 2018-11-01 (×6): qty 1

## 2018-11-01 MED ORDER — PROPOFOL 10 MG/ML IV BOLUS
INTRAVENOUS | Status: DC | PRN
  Administered 2018-11-01: 20 mg via INTRAVENOUS
  Administered 2018-11-01: 30 mg via INTRAVENOUS
  Administered 2018-11-01: 20 mg via INTRAVENOUS

## 2018-11-01 MED ORDER — CEFAZOLIN SODIUM-DEXTROSE 2-4 GM/100ML-% IV SOLN
2.0000 g | Freq: Once | INTRAVENOUS | Status: AC
  Administered 2018-11-01: 2 g via INTRAVENOUS
  Filled 2018-11-01: qty 100

## 2018-11-01 MED ORDER — MAGNESIUM SULFATE 2 GM/50ML IV SOLN
2.0000 g | Freq: Once | INTRAVENOUS | Status: AC
  Administered 2018-11-01: 10:00:00 2 g via INTRAVENOUS
  Filled 2018-11-01: qty 50

## 2018-11-01 NOTE — TOC Progression Note (Signed)
Transition of Care [facility]) - Progression Note    Patient Details  Name: MAGDALENO LORTIE MRN: [id number] Date of Birth: 10/19/1955  Transition of Care [facility] CM/SW [place], [place] Phone Number: 11/01/2018, 2:07 PM  Clinical Narrative:   CSW spoke with Raquel Sarna at [facility]. Raquel Sarna states that she has spoken with patient's daughter who is interested in placement at [facility]. Raquel Sarna also states that she they have started insurance authorization and currently are waiting on approval. Patient will receive a PEG tube today per MD. [facility] team will continue to follow for discharge planning.     Expected Discharge Plan: Skilled Nursing Facility Barriers to Discharge: Other (comment)(long term feeding means)  Expected Discharge Plan and Services Expected Discharge Plan: [place]                                               Social Determinants of Health (SDOH) Interventions    Readmission Risk Interventions No flowsheet data found.

## 2018-11-01 NOTE — Anesthesia Postprocedure Evaluation (Signed)
Anesthesia Post Note  Patient: [name]  Procedure(s) Performed: PERCUTANEOUS ENDOSCOPIC GASTROSTOMY (PEG) PLACEMENT (N/A )  Patient location during evaluation: Endoscopy Anesthesia Type: General Level of consciousness: awake and alert Pain management: pain level controlled Vital Signs Assessment: post-procedure vital signs reviewed and stable Respiratory status: spontaneous breathing, nonlabored ventilation, respiratory function stable and patient connected to nasal cannula oxygen Cardiovascular status: blood pressure returned to baseline and stable Postop Assessment: no apparent nausea or vomiting Anesthetic complications: no     Last Vitals:  Vitals:   11/01/18 1711 11/01/18 1736  BP: 137/81 (!) 161/84  Pulse:  (!) 112  Resp:  18  Temp:  37.3 C  SpO2:  96%    Last Pain:  Vitals:   11/01/18 1711  TempSrc:   PainSc: 0-No pain                 Yousif Edelson S

## 2018-11-01 NOTE — Progress Notes (Signed)
[place] at [place] NAME: [name]    MR#:  [id number]  DATE OF BIRTH:  1956/04/15  SUBJECTIVE:  CHIEF COMPLAINT:   Chief Complaint  Patient presents with  . Altered Mental Status   -More sleepy today.  Tube feeds on hold for PEG tube placement today.  No further changes  REVIEW OF SYSTEMS:  Review of Systems  Unable to perform ROS: Mental status change    DRUG ALLERGIES:   Allergies  Allergen Reactions  . Metformin Diarrhea    VITALS:  Blood pressure (!) 143/87, pulse (!) 108, temperature 99.3 F (37.4 C), temperature source Oral, resp. rate 16, height 6' 0.01" (1.829 m), weight 97.4 kg, SpO2 94 %.  PHYSICAL EXAMINATION:  Physical Exam   GENERAL:  63 y.o.-year-old ill-appearing patient lying in the bed with no acute distress.  EYES: Pupils equal, round, reactive to light and accommodation. No scleral icterus. Extraocular muscles intact.  Has a right-sided gaze HEENT: Head atraumatic, normocephalic. Oropharynx and nasopharynx clear.  NECK:  Supple, no jugular venous distention. No thyroid enlargement, no tenderness.  LUNGS: Normal breath sounds bilaterally, no wheezing, rales,rhonchi or crepitation. No use of accessory muscles of respiration.  CARDIOVASCULAR: S1, S2 normal. No murmurs, rubs, or gallops.  ABDOMEN: Soft, nontender, nondistended. Bowel sounds present. No organomegaly or mass.  EXTREMITIES: No pedal edema, cyanosis, or clubbing.  NEUROLOGIC: No obvious facial droop noted..  Not tracking, opening eyes to deep palpation.  Not following any commands.  Alert but not verbal.   Sensation intact. Gait not checked.  -Responding to touch, withdrawing.  Has 2+ symmetric bilateral lower extremity reflexes PSYCHIATRIC: The patient is alert but not oriented, remains nonverbal SKIN: No obvious rash, lesion, or ulcer.    LABORATORY PANEL:   CBC Recent Labs  Lab 11/01/18 0532  WBC 12.8*  HGB 10.8*  HCT 33.7*  PLT  404*   ------------------------------------------------------------------------------------------------------------------  Chemistries  Recent Labs  Lab 11/01/18 0532  NA 136  K 4.0  CL 100  CO2 24  GLUCOSE 156*  BUN 18  CREATININE 0.86  CALCIUM 8.8*  MG 1.7   ------------------------------------------------------------------------------------------------------------------  Cardiac Enzymes No results for input(s): TROPONINI in the last 168 hours. ------------------------------------------------------------------------------------------------------------------  RADIOLOGY:  Dg Abd 1 View  Result Date: 10/31/2018 CLINICAL DATA:  NG tube placement. EXAM: ABDOMEN - 1 VIEW COMPARISON:  10/31/2018. FINDINGS: NG tube noted with tip over the stomach. Cardiac monitor device noted over the chest. Left base atelectatic changes. Small left pleural effusion cannot be excluded. IMPRESSION: 1.  NG tube noted with tip over the stomach. 2. Left base atelectatic changes. Small left pleural effusion cannot be excluded. Electronically Signed   By: Marcello Moores  Register   On: 10/31/2018 17:06   Dg Chest Port 1 View  Result Date: 10/31/2018 CLINICAL DATA:  Dyspnea EXAM: PORTABLE CHEST 1 VIEW COMPARISON:  10/25/2018 FINDINGS: The trachea esophagus have been extubated. Mild cardiomegaly, stable. Mildly low lung volumes. There is no edema, consolidation, effusion, or pneumothorax. Haziness at the peripheral left chest is likely from soft tissue attenuation. There is an implantable loop recorder. IMPRESSION: No evidence of acute disease. Electronically Signed   By: Monte Fantasia M.D.   On: 10/31/2018 04:34   Dg Abd Portable 1v  Result Date: 10/31/2018 CLINICAL DATA:  Check gastric catheter placement EXAM: PORTABLE ABDOMEN - 1 VIEW COMPARISON:  10/28/2018 FINDINGS: Gastric catheter has been advanced slightly further into the stomach. No other focal abnormality is noted.  IMPRESSION: Gastric catheter within the mid  stomach. Electronically Signed   By: Inez Catalina M.D.   On: 10/31/2018 09:50    EKG:   Orders placed or performed in visit on 04/13/17  . 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  1.  Acute encephalopathy-initially secondary to hypoglycemia, currently extubated and sugars are improved. -Has bilateral encephalomalacia based on CT head.  Prior to left frontal craniotomy done.  Prior history of strokes. -Has right anterior cerebral artery aneurysm embolization in the past and clipping recently this year. -MRI cannot be done due to recent clipping. -Appreciate neurology consult.  Provigil dose has been increased without significant change.  Remains alert but not following commands. -Appreciate neurology consult -Very slow recovery expected at this point.  LTAC recommended.  Will need a feeding tube.  2.  Sepsis-secondary to urinary tract infection.  All cultures have been negative.  Off pressors.  Off steroids. --Finished broad-spectrum antibiotics  3.  Acute renal failure-secondary to acute urinary retention with history of BPH -Appreciate nephrology consult.  Bilateral hydronephrosis on renal ultrasound.  Status post Foley cath -Renal function is normalized at this time  4.  Poor oral intake- on tube feeds via NG tube.  However.  NG tube has been pulled out twice for now.  Has been replaced. -GI has been consulted for PEG tube placement sent mental status recovery will not be sooner according to neurology. -  Patient had a G-tube and tracheostomy which was recently removed couple of months ago and patient was eating by mouth.   5.  Seizure disorder-prophylactically on Keppra.  6.  DVT prophylaxis-subcutaneous heparin    Physical therapy consult-need for SNF versus LTAC Palliative care has been consulted    All the records are reviewed and case discussed with Care Management/Social Workerr. Management plans discussed with the patient, family and they are in agreement.  CODE STATUS: Full code  TOTAL TIME TAKING CARE OF THIS PATIENT: 33 minutes.   POSSIBLE D/C IN ?  DAYS, DEPENDING ON CLINICAL CONDITION.   Gladstone Lighter M.D on 11/01/2018 at 11:02 AM  Between 7am to 6pm - Pager - [phone]  After 6pm go to [email or web address] - password [email or web address] [facility]  Office  [phone]  CC: Primary care physician; Garwin Brothers, MD

## 2018-11-01 NOTE — Progress Notes (Addendum)
SLP Cancellation Note  Patient Details Name: [name] MRN: [id number] DOB: 03-15-1956   Cancelled treatment:       Reason Eval/Treat Not Completed: Patient not medically ready;Medical issues which prohibited therapy;Patient's level of consciousness;Fatigue/lethargy limiting ability to participate(chart reviewed; consulted MD re: pt's status and d/c poc)  Pt is planned for PEG placement today per MD/NSG; he has been on NG TFs for support. He continues to remain poorly arousable for assessment and/or any safe oral intake at this time: PT note today reported "Pt. was lethargic, and kept eyes closed throughout the session. Pt was unable to arouse, or attend to session.". NeurologyMD notes also indicate waxing/waning of any awake state but "remains markedly impaired"; "not tracking, opening eyes to deep palpation".  Due to pt's decreased alertness for even safe participation in BSE and/or any oral intake and his high risk for aspiration w/ Pulmonary decline, recommend f/u w/ skilled ST services at next venue of care to address goals of care. NSG/MD to reconsult if any further needs, education while admitted. Recommend frequent Oral Care (especially while NPO) for hygiene and stimulation of a pharyngeal swallowing; aspiration precautions to include upright positioning during oral care. MD updated, agreed.  Addendum 11/05/2018: This week, pt has been demonstrating a more awake State intermittently but not consistently, per tx note attempts by PT/OT. Pt continues to be at a high risk for aspiration w/ Pulmonary decline w/ any oral intake d/t his fluctuating State. Recommend continue previous recommendation of frequent oral care w/ NSG staff daily for hygiene and stimulation of a pharyngeal swallow; aspiration precautions. Noted SW notes for DC preparation/planning.    Orinda Kenner, MS, CCC-SLP Watson,Katherine 11/01/2018, 12:52 PM

## 2018-11-01 NOTE — Progress Notes (Signed)
Updated daughter Zohan Shiflet on plan of care, she agreed for PEG tube at this time.  Also indicated that neurological recovery might be very slow or may be no change in the future. -LTAC placement being pursued

## 2018-11-01 NOTE — TOC Benefit Eligibility Note (Signed)
Transition of Care [facility]) Benefit Eligibility Note    Patient Details  Name: [name] MRN: [id number] Date of Birth: [birth year]   Number called: [phone] Rep: Mia Reference Number: [id number]  [facility] Medicare HMO Dual Eligibility Special Needs plan effective since 02/26/18.  $198 deductible, $0 met so far.  Out of pocket max is $6,700, $0 met so far.    In-network SNF: $0 copay for days 1-100. Limited to 100 days each benefit period.  Per rep, 11 days used in current benefit period (09/16/2018 - 09/26/2018).  Josem Kaufmann is required: [phone].     Unable to determine active Medicaid status through [employer] or Registration section of chart.      Dannette Barbara Phone Number: [phone] or [phone] 11/01/2018, 1:27 PM

## 2018-11-01 NOTE — Progress Notes (Signed)
Pharmacy Electrolyte Monitoring Consult:  Pharmacy consulted to assist in monitoring and replacing electrolytes in this 63 y.o. male admitted on 10/19/2018 with Altered Mental Status   Labs:  Sodium (mmol/L)  Date Value  11/01/2018 136   Potassium (mmol/L)  Date Value  11/01/2018 4.0   Magnesium (mg/dL)  Date Value  11/01/2018 1.7   Phosphorus (mg/dL)  Date Value  11/01/2018 4.3   Calcium (mg/dL)  Date Value  11/01/2018 8.8 (L)   Albumin (g/dL)  Date Value  10/21/2018 2.4 (L)   Corrected Calcium: 9.38  Assessment/Plan: K=4.0, Mag = 1.7, Phos = 4.3  Will give magnesium 2g IV x 1.  No other electrolyte replenishment warranted at this time.  Patient is on [place].   Will replace for goal potassium ~4, goal magnesium ~ 2, and goal phosphorus ~ 2.5.   Will check Mag, Phos, and K with am labs   Pharmacy will continue to monitor and adjust per consult.    Lu Duffel, PharmD, BCPS Clinical Pharmacist 11/01/2018 8:11 AM

## 2018-11-01 NOTE — Anesthesia Post-op Follow-up Note (Signed)
Anesthesia QCDR form completed.        

## 2018-11-01 NOTE — Op Note (Addendum)
[facility] Gastroenterology Patient Name: [name] Procedure Date: 11/01/2018 3:36 PM MRN: [id number] Account #: [id number] Date of Birth: 10-09-1955 Admit Type: Inpatient Age: 63 Room: [facility] ENDO ROOM 3 Gender: Male Note Status: Finalized Procedure:            Upper GI endoscopy Indications:          Place PEG due to impaired swallowing, Place PEG due to                        aspiration risk, Place PEG due to neurological disorder                        causing impaired swallowing Providers:            Lin Landsman MD, MD, Benay Pike. Alice Reichert MD, MD Referring MD:         No Local Md, MD (Referring MD) Medicines:            Monitored Anesthesia Care Complications:        No immediate complications. Estimated blood loss: None. Procedure:            Pre-Anesthesia Assessment:                       - Prior to the procedure, a History and Physical was                        performed, and patient medications and allergies were                        reviewed. The patient is unable to give consent                        secondary to the patient being legally incompetent to                        consent. The risks and benefits of the procedure and                        the sedation options and risks were discussed with the                        patient's daughter. All questions were answered and                        informed consent was obtained. Patient identification                        and proposed procedure were verified by the physician,                        the nurse, the anesthesiologist, the anesthetist and                        the technician in the pre-procedure area in the                        procedure room in the endoscopy suite. Mental Status  Examination: obtunded. Airway Examination: normal                        oropharyngeal airway and neck mobility. Respiratory                        Examination: clear to  auscultation. CV Examination:                        normal. Prophylactic Antibiotics: The patient requires                        prophylactic antibiotics for planned PEG placement. The                        patient received antibiotic therapy today, before the                        procedure started. Prior Anticoagulants: The patient                        has taken no previous anticoagulant or antiplatelet                        agents. ASA Grade Assessment: IV - A patient with                        severe systemic disease that is a constant threat to                        life. After reviewing the risks and benefits, the                        patient was deemed in satisfactory condition to undergo                        the procedure. The anesthesia plan was to use monitored                        anesthesia care (MAC). Immediately prior to                        administration of medications, the patient was                        re-assessed for adequacy to receive sedatives. The                        heart rate, respiratory rate, oxygen saturations, blood                        pressure, adequacy of pulmonary ventilation, and                        response to care were monitored throughout the                        procedure. The physical status of the patient was  re-assessed after the procedure.                       After obtaining informed consent, the endoscope was                        passed under direct vision. Throughout the procedure,                        the patient's blood pressure, pulse, and oxygen                        saturations were monitored continuously. The Endoscope                        was introduced through the mouth, and advanced to the                        second part of duodenum. The upper GI endoscopy was                        accomplished without difficulty. The patient tolerated                        the procedure  well. Findings:      Multiple non-bleeding dispersed erosions were found in the gastric body.       There were no stigmata of recent bleeding.      The duodenal bulb and second portion of the duodenum were normal.      The gastric fundus and gastric antrum were normal.      A small scar at the previous G-tube site was found in the gastric body.       The scar tissue was healthy in appearance. The patient was placed in the       supine position for PEG placement. The stomach was insufflated to appose       gastric and abdominal walls. A site was located in the body of the       stomach with excellent transillumination and manual external pressure       for placement. The abdominal wall was marked and prepped in a sterile       manner. The area was anesthetized with 2 mL of 0.5% lidocaine. The       trocar needle was introduced through the abdominal wall and into the       stomach under direct endoscopic view. A snare was introduced through the       endoscope and opened in the gastric lumen. The guide wire was passed       through the trocar and into the open snare. The snare was closed around       the guide wire. The endoscope and snare were removed, pulling the wire       out through the mouth. A skin incision was made at the site of needle       insertion. The externally removable 20 Fr Bard gastrostomy tube was       lubricated. The G-tube was tied to the guide wire and pulled through the       mouth and into the stomach. The trocar needle was removed, and the       gastrostomy tube was pulled out from the stomach  through the skin. The       external bumper was attached to the gastrostomy tube, and the tube was       cut to remove the guide wire. The final position of the gastrostomy tube       was confirmed by relook endoscopy, and skin marking noted to be 4 cm at       the external bumper. The final tension and compression of the abdominal       wall by the PEG tube and external  bumper were checked and revealed that       the bumper was moderately tight and mildly deforming the skin. The       feeding tube was capped, and the tube site cleaned and dressed.      The gastroesophageal junction and examined esophagus were normal. Impression:           - Gastric erosions without bleeding.                       - Normal duodenal bulb and second portion of the                        duodenum.                       - Normal gastric fundus and antrum.                       - Scar in the gastric body.                       - Normal gastroesophageal junction and esophagus.                       - An externally removable PEG placement was                        successfully completed.                       - No specimens collected. Recommendation:       - Return patient to hospital ward for ongoing care.                       - Please follow the post-PEG recommendations including:                        Nutrition consult for formula and volume, external                        bolster snug to abdominal wall, change dressing once                        per day, NPO x4 hrs then water today, may use PEG today                        for meds and water, may use PEG tomorrow for feedings,                        flush PEG daily with 60 ml water, antibiotic ointment  to site, check site for bleeding q 4 hrs and clean site                        with soap and water daily and dry thoroughly.                       - Use Protonix (pantoprazole) 40 mg PO BID for 3 months. Procedure Code(s):    --- Professional ---                       [id number], Esophagogastroduodenoscopy, flexible, transoral;                        with directed placement of percutaneous gastrostomy tube Diagnosis Code(s):    --- Professional ---                       K25.9, Gastric ulcer, unspecified as acute or chronic,                        without hemorrhage or perforation                        K31.89, Other diseases of stomach and duodenum                       R13.10, Dysphagia, unspecified                       Z43.1, Encounter for attention to gastrostomy                       R63.3, Feeding difficulties                       R29.818, Other symptoms and signs involving the nervous                        system CPT copyright 2019 American Medical Association. All rights reserved. The codes documented in this report are preliminary and upon coder review may  be revised to meet current compliance requirements. Dr. Ulyess Mort Lin Landsman MD, MD 11/01/2018 4:39:44 PM This report has been signed electronically. Efrain Sella MD, MD Number of Addenda: 0 Note Initiated On: 11/01/2018 3:36 PM Estimated Blood Loss: Estimated blood loss: none.      [facility]

## 2018-11-01 NOTE — Progress Notes (Signed)
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 who presented from his facility unresponsive requiring him to be ventilated in the ED. Patient has been seen in previous years at behavioral health for suicide ideation.    OT comments  Pt. tolerated PROM for BUEs to decrease risk of contractures, joint stiffness, and to promote good skin integrity. Pt. Presents with flexor tightness in the LUE.  Pt. was lethargic, and kept eyes closed throughout the session. Pt. was unable to arouse, or attend to session. Pt. Intermittently coughed during the session, and spontaneously raised the RUE to his face. Pt. continues to benefit from OT services for ADL training, UE there. Ex., positioning, and pt. Education. Pt. would benefitt from SNF level of care upon discharge. Pt. Could benefit from follow-up OT services at discharge.   Follow Up Recommendations  SNF;LTACH    Equipment Recommendations       Recommendations for Other Services      Precautions / Restrictions Restrictions Weight Bearing Restrictions: No       Mobility Bed Mobility Overal bed mobility: Needs Assistance             General bed mobility comments: Total A x2 for any bed mobility  Transfers                      Balance                                           ADL either performed or assessed with clinical judgement   ADL Overall ADL's : Needs assistance/impaired                                       General ADL Comments: total dependence for all ADLs, Pt. is NPO     Vision       Perception     Praxis      Cognition Arousal/Alertness: Lethargic Behavior During  Therapy: Flat affect Overall Cognitive Status: Difficult to assess                                 General Comments: Keeps eyes closed throughout the session.         Exercises Other Exercises Other Exercises: BUE PROM    Shoulder Instructions       General Comments      Pertinent Vitals/ Pain       Pain Assessment: No/denies pain  Home Living                                          Prior Functioning/Environment              Frequency  Min 1X/week        Progress Toward Goals  OT Goals(current goals can now be found in the care plan section)     Acute  Rehab OT Goals OT Goal Formulation: Patient unable to participate in goal setting  Plan      Co-evaluation                 AM-PAC OT "6 Clicks" Daily Activity     Outcome Measure   Help from another person eating meals?: Total Help from another person taking care of personal grooming?: Total Help from another person toileting, which includes using toliet, bedpan, or urinal?: Total Help from another person bathing (including washing, rinsing, drying)?: Total Help from another person to put on and taking off regular upper body clothing?: Total Help from another person to put on and taking off regular lower body clothing?: Total 6 Click Score: 6    End of Session    OT Visit Diagnosis: Other abnormalities of gait and mobility (R26.89);Cognitive communication deficit (R41.841);Muscle weakness (generalized) (M62.81) Symptoms and signs involving cognitive functions: Other cerebrovascular disease   Activity Tolerance Patient limited by lethargy   Patient Left in bed;with call bell/phone within reach;with bed alarm set   Nurse Communication          Time: 7564-3329 OT Time Calculation (min): 11 min  Charges: OT General Charges $OT Visit: 1 Visit OT Treatments $Self Care/Home Management : 8-22 mins Harrel Carina, MS, OTR/L  Harrel Carina 11/01/2018,  11:19 AM

## 2018-11-01 NOTE — Care Management Important Message (Signed)
Important Message  Patient Details  Name: [name] MRN: [id number] Date of Birth: [birth year]   Medicare Important Message Given:  Yes    [name] 11/01/2018, 11:15 AM

## 2018-11-01 NOTE — Transfer of Care (Signed)
Immediate Anesthesia Transfer of Care Note  Patient: Star Age Twaddle  Procedure(s) Performed: PERCUTANEOUS ENDOSCOPIC GASTROSTOMY (PEG) PLACEMENT (N/A )  Patient Location: PACU  Anesthesia Type:General  Level of Consciousness: responds to stimulation  Airway & Oxygen Therapy: Patient Spontanous Breathing and Patient connected to nasal cannula oxygen  Post-op Assessment: Report given to RN and Post -op Vital signs reviewed and stable  Post vital signs: Reviewed and stable  Last Vitals:  Vitals Value Taken Time  BP 133/83 11/01/2018  4:41 PM  Temp 36.1 C 11/01/2018  4:41 PM  Pulse 123 11/01/2018  4:44 PM  Resp 26 11/01/2018  4:44 PM  SpO2 97 % 11/01/2018  4:44 PM  Vitals shown include unvalidated device data.  Last Pain:  Vitals:   11/01/18 1641  TempSrc: Tympanic  PainSc: Asleep         Complications: No apparent anesthesia complications

## 2018-11-01 NOTE — Anesthesia Preprocedure Evaluation (Signed)
Anesthesia Evaluation  Patient identified by MRN, date of birth, ID band Patient awake    Reviewed: Allergy & Precautions, NPO status , Patient's Chart, lab work & pertinent test results, reviewed documented beta blocker date and time   Airway Mallampati: III  TM Distance: >3 FB     Dental  (+) Chipped   Pulmonary           Cardiovascular hypertension, Pt. on medications and Pt. on home beta blockers      Neuro/Psych  Headaches, Seizures -,  PSYCHIATRIC DISORDERS Anxiety Depression TIA Neuromuscular disease CVA    GI/Hepatic   Endo/Other  diabetes  Renal/GU Renal disease     Musculoskeletal  (+) Arthritis ,   Abdominal   Peds  Hematology  (+) anemia ,   Anesthesia Other Findings Drug hx. Mental changes. EKG and ECHO ok.   Reproductive/Obstetrics                             Anesthesia Physical Anesthesia Plan  ASA: IV  Anesthesia Plan: General   Post-op Pain Management:    Induction: Intravenous  PONV Risk Score and Plan:   Airway Management Planned:   Additional Equipment:   Intra-op Plan:   Post-operative Plan:   Informed Consent: I have reviewed the patients History and Physical, chart, labs and discussed the procedure including the risks, benefits and alternatives for the proposed anesthesia with the patient or authorized representative who has indicated his/her understanding and acceptance.       Plan Discussed with: CRNA  Anesthesia Plan Comments:         Anesthesia Quick Evaluation

## 2018-11-01 NOTE — Progress Notes (Signed)
Daily Progress Note   Patient Name: MARQUISE WICKE       Date: 11/01/2018 DOB: [birth year]  Age: 63 y.o. MRN#: [id number] Attending Physician: Gladstone Lighter, MD Primary Care Physician: Garwin Brothers, MD Admit Date: 10/19/2018  Reason for Consultation/Follow-up: Establishing goals of care  Subjective: Patient in bed. Possible PEG tube placement. He is more sleepy today. Does not open eyes, continues to not follow commands, nonverbal. Continues to require NG tube feedings due to poor nutrition. NG dislodged on several occassions yesterday, but successfully reinserted.   Spoke with daughter, [name] and provided updates. She is in agreement with PEG tube. She is tearful and shares it is not what they would want "been there done that" and was hopeful for continued improvement. She states she is hopeful that he does not have to return back to [facility] facility and is aware he most likely will require LTAC such as [facility]. Goals remain as expressed yesterday, allowing Mr. Segoviano time to show signs of improvement with full aggressive medical interventions. In the event he continues to decline or shows no signs of improvement family will then consider a more comfort approach. Until then they wish to take one day at a time and watchfully wait. She feels his progress will be slow as he exhibited improvement in the past but at a slow rate.   Support provided.   Length of Stay: 13  Current Medications: Scheduled Meds:  . chlorhexidine  15 mL Mouth Rinse BID  . Chlorhexidine Gluconate Cloth  6 each Topical Q0600  . feeding supplement (PRO-STAT SUGAR FREE 64)  30 mL Per Tube BID  . free water  150 mL Per Tube Q4H  . heparin  5,000 Units Subcutaneous Q8H  . mouth rinse  15 mL Mouth Rinse q12n4p  .  modafinil  200 mg Per Tube BID  . nutrition supplement (JUVEN)  1 packet Per Tube BID BM    Continuous Infusions: . sodium chloride Stopped (10/30/18 1116)  .  ceFAZolin (ANCEF) IV    . dextrose 50 mL/hr at 10/30/18 1545  . famotidine (PEPCID) IV 20 mg (11/01/18 0058)  . feeding supplement (OSMOLITE 1.5 CAL) 1,000 mL (10/30/18 1010)  . levETIRAcetam 500 mg (11/01/18 0841)  . magnesium sulfate bolus IVPB 2 g (11/01/18 1007)    PRN Meds: sodium  chloride, acetaminophen, polyethylene glycol  Physical Exam Vitals signs and nursing note reviewed.  Constitutional:      General: He is sleeping.     Appearance: He is ill-appearing.  Cardiovascular:     Rate and Rhythm: Normal rate and regular rhythm.     Pulses: Normal pulses.     Heart sounds: Normal heart sounds.  Pulmonary:     Effort: Pulmonary effort is normal.     Breath sounds: Decreased breath sounds present.  Neurological:     Comments: Will not follow commands, sleeping            Vital Signs: BP (!) 143/87   Pulse (!) 108   Temp 99.3 F (37.4 C) (Oral)   Resp 16   Ht 6' 0.01" (1.829 m)   Wt 97.4 kg   SpO2 94%   BMI 29.13 kg/m  SpO2: SpO2: 94 % O2 Device: O2 Device: Room Air O2 Flow Rate: O2 Flow Rate (L/min): 2 L/min  Intake/output summary:   Intake/Output Summary (Last 24 hours) at 11/01/2018 1039 Last data filed at 11/01/2018 4403 Gross per 24 hour  Intake -  Output 1550 ml  Net -1550 ml   LBM: Last BM Date: 10/30/18 Baseline Weight: Weight: 113.4 kg Most recent weight: Weight: 97.4 kg       Palliative Assessment/Data: NG TUBE, pending PEG   Flowsheet Rows     Most Recent Value  Intake Tab  Referral Department  Hospitalist  Unit at Time of Referral  Med/Surg Unit  Date Notified  10/31/18  Palliative Care Type  New Palliative care  Reason for referral  Clarify Goals of Care  Date of Admission  10/19/18  Date first seen by Palliative Care  10/31/18  # of days Palliative referral response time  0  Day(s)  # of days IP prior to Palliative referral  12  Clinical Assessment  Psychosocial & Spiritual Assessment  Palliative Care Outcomes      Patient Active Problem List   Diagnosis Date Noted  . Pressure injury of skin 10/23/2018  . Sepsis ([place]) 10/19/2018  . Severe major depression ([place]) 04/14/2017  . Major depressive disorder, recurrent ([place]) 04/09/2017  . MDD (major depressive disorder), recurrent severe, without psychosis ([place]) 04/09/2017  . Family history of brain aneurysm   . Intractable vomiting 10/20/2016  . History of intracranial aneurysm   . Hypokalemia   . AKI (acute kidney injury) ([place])   . Prolonged QT interval   . Lactic acidosis   . Dysphagia   . Esophageal ring   . Gastroesophageal reflux disease with esophagitis   . Nausea and vomiting 10/01/2016  . 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 Severe episode of recurrent major depressive disorder, without psychotic features ([place]) 07/20/2016  . CVA (cerebral vascular accident) ([place]) 06/15/2016  . Abdominal pain   . Numbness   . Carpal tunnel syndrome, bilateral 12/16/2015  . Status post stroke 11/12/2015  . Transient neurologic deficit 10/15/2015  . Cerebrovascular accident, late effects 10/07/2015  . Complicated migraine   . Anxiety state   . 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 Cerebral infarction due to embolism of right middle cerebral artery ([place]) 09/23/2015  . Cerebral infarction due to embolism of cerebral artery ([place]) 09/23/2015  . Gait disturbance, post-stroke   . Drooping of mouth   . Cerebrovascular accident (CVA) due to embolism of right anterior cerebral artery ([place])   . Benign essential HTN   . Migraine with aura and without status migrainosus, not intractable   .  Tachypnea   . Prediabetes   . Acute blood loss anemia   . Brain aneurysm 09/20/2015  . Type 2 diabetes mellitus ([place]) 09/15/2015  . Leukocytosis 09/15/2015  . Aneurysm, cerebral, nonruptured 09/15/2015  . TIA (transient ischemic  attack) 09/14/2015  . Temporary cerebral vascular dysfunction 09/14/2015  . H/O transient cerebral ischemia 07/29/2015  . At risk for falling 07/29/2015  . Bilateral hearing loss 10/25/2014  . Abnormal fear 08/08/2012  . Headache, migraine 08/08/2012  . Arthritis, degenerative 08/08/2012  . History of tear of ACL (anterior cruciate ligament) 03/08/2011  . History of knee problem 03/08/2011  . Depression 09/07/2010  . Depressive disorder, not elsewhere classified 09/07/2010  . URI (upper respiratory infection) 08/24/2010  . Hearing loss 09/06/2006  . Hyperlipidemia 04/23/2006  . Essential hypertension 04/23/2006  . Psychophysiological insomnia 04/23/2006    Palliative Care Assessment & Plan   Patient Profile:  Palliative Care consult requested for this 63 y.o. male with multiple medical problems including  Hypertension, CVA, depression, drug abuse, brain aneurysm s/p clipping and craniotomy, tracheostomy, PEG, and hyperlipidemia. He presented to ED with blood sugar of 34 from [facility] facility. He was unresponsive and required intubation for airway protection. He was successfully extubated, yet continues to have poor nutritional intake requiring a NG tube and remains alert but unable to follow commands.   Assessment: Acute encephalopathy Sepsis Acute renal failure  Poor oral intake Seizure disorder  Recommendations/Plan:  Full Code  Continue to treat, aggressive medical interventions (PEG)  Outpatient palliative at discharge  PMT will continue to support and follow  Goals of Care and Additional Recommendations:  Limitations on Scope of Treatment: Full Scope Treatment  Code Status:    Code Status Orders  (From admission, onward)         Start     Ordered   10/19/18 1309  Full code  Continuous     10/19/18 1308        Code Status History    Date Active Date Inactive Code Status Order ID Comments User Context   04/14/2017 2026 04/16/2017 1715 Full  Code [id number]  Rozetta Nunnery, NP Inpatient   04/13/2017 1605 04/14/2017 1823 Full Code [id number]  Burgess Estelle, MD Inpatient   04/09/2017 1614 04/13/2017 1044 Full Code [id number]  Ethelene Hal, NP Inpatient   04/08/2017 1824 04/09/2017 1557 Full Code [id number]  Pope, Polo, PA-C ED   04/02/2017 0426 04/03/2017 1829 Full Code [id number]  Recardo Evangelist, PA-C ED   03/12/2017 2121 03/13/2017 1342 Full Code [id number]  Orlie Dakin, MD ED   10/20/2016 1921 10/24/2016 1915 Full Code [id number]  Sela Hilding, MD Inpatient   10/01/2016 1632 10/05/2016 1633 Full Code [id number]  Tonette Bihari, MD ED   06/15/2016 1828 06/16/2016 2021 Full Code [id number]  Lavina Hamman, MD ED   12/28/2015 1729 12/31/2015 1826 Full Code [id number]  Luanne Bras, MD Inpatient   12/24/2015 1702 12/28/2015 1729 Full Code [id number]  Rondel Jumbo, PA-C ED   09/23/2015 1522 09/30/2015 1324 Full Code [id number]  Cathlyn Parsons, PA-C Inpatient   09/23/2015 1522 09/23/2015 1522 Full Code [id number]  Cathlyn Parsons, PA-C Inpatient   09/20/2015 1835 09/23/2015 1522 Full Code [id number]  Luanne Bras, MD Inpatient   09/15/2015 0009 09/17/2015 1519 Full Code [id number]  Reubin Milan, MD ED      Prognosis:   Guarded to Poor  Discharge Planning:  LTAC with outpatient palliative  Care plan was discussed with patient's  daughter, [name] and Physiological scientist.   The above conversation was completed via telephone due to the visitor restrictions during the COVID-19 pandemic. Thorough chart review and discussion with necessary members of the care team was completed as part of assessment.   Thank you for allowing the Palliative Medicine Team to assist in the care of this patient.  Total Time: 35 min.   Greater than 50%  of this time was spent counseling and coordinating care related to the above assessment and plan.  Alda Lea, AGPCNP-BC Palliative Medicine Team  Phone: [phone]  Pager: [phone] Amion: Bjorn Pippin     Please contact Palliative Medicine Team phone at [phone] for questions and concerns.

## 2018-11-01 NOTE — Anesthesia Procedure Notes (Signed)
Date/Time: 11/01/2018 4:28 PM Performed by: Nelda Marseille, CRNA Pre-anesthesia Checklist: Patient identified, Emergency Drugs available, Suction available, Patient being monitored and Timeout performed Oxygen Delivery Method: Nasal cannula

## 2018-11-01 NOTE — Progress Notes (Signed)
PT Cancellation Note  Patient Details Name: [name] MRN: [id number] DOB: 06/23/1955   Cancelled Treatment:    Reason Eval/Treat Not Completed: Patient at procedure or test/unavailable Pt out of room in ENDO for PEG placement.    Kreg Shropshire, DPT 11/01/2018, 4:50 PM

## 2018-11-02 LAB — GLUCOSE, CAPILLARY
Glucose-Capillary: 121 mg/dL — ABNORMAL HIGH (ref 70–99)
Glucose-Capillary: 120 mg/dL — ABNORMAL HIGH (ref 70–99)
Glucose-Capillary: 138 mg/dL — ABNORMAL HIGH (ref 70–99)
Glucose-Capillary: 130 mg/dL — ABNORMAL HIGH (ref 70–99)
Glucose-Capillary: 137 mg/dL — ABNORMAL HIGH (ref 70–99)

## 2018-11-02 LAB — BASIC METABOLIC PANEL
Anion gap: 9 (ref 5–15)
BUN: 10 mg/dL (ref 8–23)
CO2: 25 mmol/L (ref 22–32)
Calcium: 8.5 mg/dL — ABNORMAL LOW (ref 8.9–10.3)
Chloride: 103 mmol/L (ref 98–111)
Creatinine, Ser: 0.77 mg/dL (ref 0.61–1.24)
GFR calc Af Amer: >60 mL/min (ref >60)
GFR calc non Af Amer: >60 mL/min (ref >60)
Glucose, Bld: 125 mg/dL — ABNORMAL HIGH (ref 70–99)
Potassium: 3.9 mmol/L (ref 3.5–5.1)
Sodium: 137 mmol/L (ref 135–145)

## 2018-11-02 LAB — MAGNESIUM: Magnesium: 1.9 mg/dL (ref 1.7–2.4)

## 2018-11-02 MED ORDER — DEXTROSE 5 % IV SOLN
INTRAVENOUS | Status: DC
  Administered 2018-11-03: 03:00:00 via INTRAVENOUS

## 2018-11-02 MED ORDER — OSMOLITE 1.5 CAL PO LIQD
1000.0000 mL | ORAL | Status: DC
  Administered 2018-11-02 – 2018-11-28 (×28): 1000 mL

## 2018-11-02 MED ORDER — PRO-STAT SUGAR FREE PO LIQD
30.0000 mL | Freq: Two times a day (BID) | ORAL | Status: DC
  Administered 2018-11-02 – 2018-11-16 (×29): 30 mL

## 2018-11-02 MED ORDER — SODIUM CHLORIDE 0.9 % IV SOLN: INTRAVENOUS | Status: DC

## 2018-11-02 MED ORDER — FAMOTIDINE 40 MG/5ML PO SUSR
20.0000 mg | Freq: Two times a day (BID) | ORAL | Status: DC
  Administered 2018-11-02 – 2018-11-06 (×8): 20 mg via ORAL
  Filled 2018-11-02 (×9): qty 2.5

## 2018-11-02 NOTE — Progress Notes (Signed)
Pharmacy Electrolyte Monitoring Consult:  Pharmacy consulted to assist in monitoring and replacing electrolytes in this 63 y.o. male admitted on 10/19/2018 with Altered Mental Status   Labs:  Sodium (mmol/L)  Date Value  11/02/2018 137   Potassium (mmol/L)  Date Value  11/02/2018 3.9   Magnesium (mg/dL)  Date Value  11/02/2018 1.9   Phosphorus (mg/dL)  Date Value  11/01/2018 4.3   Calcium (mg/dL)  Date Value  11/02/2018 8.5 (L)   Albumin (g/dL)  Date Value  10/21/2018 2.4 (L)   Corrected Calcium: 9.38  Assessment/Plan: K=3.9, Mag = 1.9, Scr 0.77 No other electrolyte replenishment warranted at this time.  Patient is on [place].   Will replace for goal potassium ~4, goal magnesium ~ 2, and goal phosphorus ~ 2.5.  Will check Mag, Phos, and K with am labs  Pharmacy will continue to monitor and adjust per consult.    Noralee Space, PharmD, BCPS Clinical Pharmacist 11/02/2018 1:41 PM

## 2018-11-02 NOTE — Progress Notes (Signed)
Nutrition Follow-up  RD working remotely.  DOCUMENTATION CODES:   Obesity unspecified  INTERVENTION:   Resume Osmolite 1.5 Cal via PEG at goal rate of 60 mL/hr + Pro-Stat 30 mL BID per tube. Provides 2360 kcal, 120 grams of protein, 1094 mL H2O daily.  Resume free water flush of 150 mL Q4hrs via PEG. This provides a total of 1994 mL H2O daily including water in tube feeding.  Goal regimen meets 100% RDIs for vitamins/minerals.  Continue Juven BID per tube to promote wound healing.  NUTRITION DIAGNOSIS:   Inadequate oral intake related to inability to eat as evidenced by NPO status.  Ongoing.  GOAL:   Patient will meet greater than or equal to 90% of their needs  Met with TF regimen.  MONITOR:   Diet advancement, Labs, Weight trends, I & O's, Skin  REASON FOR ASSESSMENT:   Consult Enteral/tube feeding initiation and management  ASSESSMENT:   63 year old male with PMHx of HTN, HLD, depression, anxiety, hx CVA, hx drug abuse admitted with acute mental status change in setting of prior cerebral hemorrhage and CVA and requiring intubation on 5/23 for airway protection, also with urinary retention with acute renal failure.   Pt s/p PEG placement 6/5. Resume tube feeds today via PEG. Per chart, pt down 34lbs since admit; RD will continue to monitor.   Medications reviewed and include: pepcid, heparin, 5% dextrose '@50ml'$ /hr   Labs reviewed: K 3.9 wnl, Mg 1.9 wnl cbgs- 121, 120 x 24 hrs  Diet Order:   Diet Order    None     EDUCATION NEEDS:   No education needs have been identified at this time  Skin:  Skin Assessment: Skin Integrity Issues:(stg II coccyx early/partial granulation; MSAD to buttocks)  Last BM:  10/31/2018 - small type 7  Height:   Ht Readings from Last 1 Encounters:  11/01/18 6' (1.829 m)   Weight:   Wt Readings from Last 1 Encounters:  11/02/18 98 kg   Ideal Body Weight:  80.9 kg  BMI:  Body mass index is 29.29 kg/m.  Estimated  Nutritional Needs:   Kcal:  2300-2600kcal/day   Protein:  115-130g/day   Fluid:  2 L/day (25 mL/kg IBW)  Koleen Distance MS, RD, LDN Pager #- [phone] Office#- [phone] After Hours Pager: [phone]

## 2018-11-02 NOTE — Plan of Care (Signed)

## 2018-11-02 NOTE — Progress Notes (Signed)
Patient's HR sustaining 130, received verbal order from Dr. Darvin Neighbours for metoprolol 25mg  one time, apply telemetry monitor, 500cc bolus one time. Will continue to monitor.

## 2018-11-02 NOTE — Progress Notes (Signed)
[place] [facility] [place] NAME: Shawna Kiener    MR#:  [id number]  DATE OF BIRTH:  Jun 20, 1955  SUBJECTIVE:   -More sleepy today. Although opens eyes on VC.s/p PEG tube placement  No further changes -intermittent tachy--got IV metoprolol yday pm  REVIEW OF SYSTEMS:  Review of Systems  Unable to perform ROS: Mental status change    DRUG ALLERGIES:   Allergies  Allergen Reactions   Metformin Diarrhea    VITALS:  Blood pressure (!) 142/80, pulse (!) 108, temperature 99.3 F (37.4 C), temperature source Oral, resp. rate 16, height 6' (1.829 m), weight 98 kg, SpO2 97 %.  PHYSICAL EXAMINATION:  Physical Exam   GENERAL:  63 y.o.-year-old ill-appearing patient lying in the bed with no acute distress. Looks chronically ill EYES: Pupils equal, round, reactive to light and accommodation. No scleral icterus. Extraocular muscles intact.  Has a right-sided gaze HEENT: Head atraumatic, normocephalic. Oropharynx and nasopharynx clear.  NECK:  Supple, no jugular venous distention. No thyroid enlargement, no tenderness.  LUNGS: Normal breath sounds bilaterally, no wheezing, rales,rhonchi or crepitation. No use of accessory muscles of respiration.  CARDIOVASCULAR: S1, S2 normal. No murmurs, rubs, or gallops.  ABDOMEN: Soft, nontender, nondistended. Bowel sounds present. No organomegaly or mass. Foley+  PEG+ EXTREMITIES: No pedal edema, cyanosis, or clubbing.  NEUROLOGIC: No obvious facial droop noted..  Not tracking, opening eyes to [facility]   Not following any commands.  Alert but not verbal.   Sensation intact. Gait not checked.  -Responding to touch, withdrawing.  Has 2+ symmetric bilateral lower extremity reflexes PSYCHIATRIC: The patient is alert but not oriented, remains nonverbal SKIN: No obvious rash, lesion, or ulcer.    LABORATORY PANEL:   CBC Recent Labs  Lab 11/01/18 0532  WBC 12.8*  HGB 10.8*  HCT 33.7*  PLT 404*    ------------------------------------------------------------------------------------------------------------------  Chemistries  Recent Labs  Lab 11/02/18 0434  NA 137  K 3.9  CL 103  CO2 25  GLUCOSE 125*  BUN 10  CREATININE 0.77  CALCIUM 8.5*  MG 1.9   ------------------------------------------------------------------------------------------------------------------  Cardiac Enzymes No results for input(s): TROPONINI in the last 168 hours. ------------------------------------------------------------------------------------------------------------------  RADIOLOGY:  Dg Abd 1 View  Result Date: 10/31/2018 CLINICAL DATA:  NG tube placement. EXAM: ABDOMEN - 1 VIEW COMPARISON:  10/31/2018. FINDINGS: NG tube noted with tip over the stomach. Cardiac monitor device noted over the chest. Left base atelectatic changes. Small left pleural effusion cannot be excluded. IMPRESSION: 1.  NG tube noted with tip over the stomach. 2. Left base atelectatic changes. Small left pleural effusion cannot be excluded. Electronically Signed   By: Marcello Moores  Register   On: 10/31/2018 17:06   Dg Abd Portable 1v  Result Date: 10/31/2018 CLINICAL DATA:  Check gastric catheter placement EXAM: PORTABLE ABDOMEN - 1 VIEW COMPARISON:  10/28/2018 FINDINGS: Gastric catheter has been advanced slightly further into the stomach. No other focal abnormality is noted. IMPRESSION: Gastric catheter within the mid stomach. Electronically Signed   By: Inez Catalina M.D.   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  1.  Acute encephalopathy-initially secondary to hypoglycemia,  currently  extubated and sugars are improved. -Has bilateral encephalomalacia based on CT head.  Prior to left frontal craniotomy done.  Prior history of strokes. -Has right anterior cerebral artery aneurysm embolization in the past and clipping recently this year. -MRI cannot be done due to recent clipping. -Appreciate neurology consult.  Provigil dose has been increased without significant change.  Remains alert but not following commands. -Very slow recovery expected at this point.  LTAC recommended.   -S/P PEG placement on June 5th 2020--Dietitian for PEG fedding  2.  Sepsis-secondary to urinary tract infection.  All cultures have been negative.  Off pressors.  Off steroids. --Finished broad-spectrum antibiotics  3.  Acute renal failure-secondary to acute urinary retention with history of BPH -Appreciate nephrology consult.  Bilateral hydronephrosis on renal ultrasound. -  Status post Foley cath -Renal function is normalized at this time  4.  Poor oral intake- on tube feeds via NG tube.  However.  NG tube has been pulled out twice for now.  Has been replaced. -GI has been consulted now s/p PEG tube placement sent mental status recovery will not be sooner according to neurology. -  Patient had a G-tube and tracheostomy which was recently removed couple of months ago and patient was eating by mouth.   5.  Seizure disorder-prophylactically on Keppra.  6.  DVT prophylaxis-subcutaneous heparin   Physical therapy consult-need for SNF versus LTAC Palliative care has been consulted  CODE STATUS: Full code  TOTAL TIME TAKING CARE OF THIS PATIENT: 30 minutes.   POSSIBLE D/C IN ?  DAYS, DEPENDING ON CLINICAL CONDITION.   Fritzi Mandes M.D on 11/02/2018 at 8:14 AM  Between 7am to 6pm - Pager - [phone]  After 6pm go to [email or web address] - password [email or web address] [facility]  Office  [phone]  CC: Primary care physician; Garwin Brothers, MD

## 2018-11-03 DIAGNOSIS — Z931 Gastrostomy status: Secondary | ICD-10-CM

## 2018-11-03 LAB — BASIC METABOLIC PANEL
Anion gap: 10 (ref 5–15)
BUN: 17 mg/dL (ref 8–23)
CO2: 24 mmol/L (ref 22–32)
Calcium: 8.4 mg/dL — ABNORMAL LOW (ref 8.9–10.3)
Chloride: 104 mmol/L (ref 98–111)
Creatinine, Ser: 0.63 mg/dL (ref 0.61–1.24)
GFR calc Af Amer: >60 mL/min (ref >60)
GFR calc non Af Amer: >60 mL/min (ref >60)
Glucose, Bld: 118 mg/dL — ABNORMAL HIGH (ref 70–99)
Potassium: 3.5 mmol/L (ref 3.5–5.1)
Sodium: 138 mmol/L (ref 135–145)

## 2018-11-03 LAB — GLUCOSE, CAPILLARY
Glucose-Capillary: 122 mg/dL — ABNORMAL HIGH (ref 70–99)
Glucose-Capillary: 124 mg/dL — ABNORMAL HIGH (ref 70–99)
Glucose-Capillary: 126 mg/dL — ABNORMAL HIGH (ref 70–99)
Glucose-Capillary: 133 mg/dL — ABNORMAL HIGH (ref 70–99)
Glucose-Capillary: 150 mg/dL — ABNORMAL HIGH (ref 70–99)
Glucose-Capillary: 151 mg/dL — ABNORMAL HIGH (ref 70–99)

## 2018-11-03 MED ORDER — LEVETIRACETAM 100 MG/ML PO SOLN
500.0000 mg | Freq: Two times a day (BID) | ORAL | Status: DC
  Administered 2018-11-03 – 2018-11-28 (×51): 500 mg
  Filled 2018-11-03 (×57): qty 5

## 2018-11-03 NOTE — Progress Notes (Signed)
PHARMACIST - PHYSICIAN COMMUNICATION  CONCERNING: IV to Oral Route Change Policy  RECOMMENDATION: This patient is receiving Keppra by the intravenous route.  Based on criteria approved by the Pharmacy and Therapeutics Committee, the intravenous medication(s) is/are being converted to the equivalent oral dose form(s).   DESCRIPTION: These criteria include:  The patient is eating (either orally or via tube) and/or has been taking other orally administered medications for a least 24 hours  The patient has no evidence of active gastrointestinal bleeding or impaired GI absorption (gastrectomy, short bowel, patient on TNA or NPO).  If you have questions about this conversion, please contact the Pharmacy Department   []   [phone] )  [place], [facility] 11/03/2018 8:18 AM

## 2018-11-03 NOTE — Progress Notes (Signed)
[facility] [place] NAME: [name]    MR#:  [id number]  DATE OF BIRTH:  [birth year]  SUBJECTIVE:   -pt opens eyes on VC.s/p PEG tube placement  No further changes   REVIEW OF SYSTEMS:  Review of Systems  Unable to perform ROS: Mental status change    DRUG ALLERGIES:   Allergies  Allergen Reactions  . Metformin Diarrhea    VITALS:  Blood pressure 135/82, pulse 100, temperature 99.9 F (37.7 C), resp. rate 19, height 6' (1.829 m), weight 98.1 kg, SpO2 100 %.  PHYSICAL EXAMINATION:  Physical Exam   GENERAL:  63 y.o.-year-old ill-appearing patient lying in the bed with no acute distress. Looks chronically ill EYES: Pupils equal, round, reactive to light and accommodation. No scleral icterus. Extraocular muscles intact.  Has a right-sided gaze HEENT: Head atraumatic, normocephalic. Oropharynx and nasopharynx clear.  NECK:  Supple, no jugular venous distention. No thyroid enlargement, no tenderness.  LUNGS: Normal breath sounds bilaterally, no wheezing, rales,rhonchi or crepitation. No use of accessory muscles of respiration.  CARDIOVASCULAR: S1, S2 normal. No murmurs, rubs, or gallops.  ABDOMEN: Soft, nontender, nondistended. Bowel sounds present. No organomegaly or mass. Foley+  PEG+ EXTREMITIES: No pedal edema, cyanosis, or clubbing.  NEUROLOGIC: No obvious facial droop noted..  Not tracking, opening eyes to [facility]   Not following any commands.  Alert but not verbal.   Sensation intact. Gait not checked.  -Responding to touch, withdrawing.  Has 2+ symmetric bilateral lower extremity reflexes PSYCHIATRIC: The patient is alert but not oriented, remains nonverbal SKIN: No obvious rash, lesion, or ulcer.    LABORATORY PANEL:   CBC Recent Labs  Lab 11/01/18 0532  WBC 12.8*  HGB 10.8*  HCT 33.7*  PLT 404*   ------------------------------------------------------------------------------------------------------------------   Chemistries  Recent Labs  Lab 11/02/18 0434 11/03/18 0649  NA 137 138  K 3.9 3.5  CL 103 104  CO2 25 24  GLUCOSE 125* 118*  BUN 10 17  CREATININE 0.77 0.63  CALCIUM 8.5* 8.4*  MG 1.9  --    ------------------------------------------------------------------------------------------------------------------  Cardiac Enzymes No results for input(s): TROPONINI in the last 168 hours. ------------------------------------------------------------------------------------------------------------------  RADIOLOGY:  No results found.  EKG:   Orders placed or performed in visit on 04/13/17  . 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  1.  Acute encephalopathy-initially secondary to hypoglycemia, currently extubated and sugars are improved. -Has bilateral encephalomalacia based on CT head.  Prior to left frontal craniotomy done.  Prior history of strokes. -Has right anterior cerebral artery aneurysm embolization in the past and clipping recently this year. -MRI cannot be done due to recent clipping. -Appreciate neurology consult.  Provigil dose has been increased without significant change.  Remains alert but not following commands. -Very slow recovery expected at this point.  LTAC recommended.   -S/P PEG placement on June 5th 2020--Dietitian for PEG fedding  2.  Sepsis-secondary to urinary tract infection.  All cultures have been negative.  Off pressors.  Off steroids. --Finished broad-spectrum antibiotics  3.  Acute renal failure-secondary to acute urinary retention with history of BPH -Appreciate nephrology consult.  Bilateral hydronephrosis on renal ultrasound. -  Status post Foley cath -Renal function is normalized at  this  time  4.  Poor oral intake- on tube feeds via NG tube.  However.  NG tube has been pulled out twice for now.  Has been replaced. -GI has been consulted now s/p PEG tube placement sent mental status recovery will not be sooner according to neurology. -  Patient had a G-tube and tracheostomy which was recently removed couple of months ago and patient was eating by mouth.   5.  Seizure disorder-prophylactically on Keppra.  6.  DVT prophylaxis-subcutaneous heparin   Physical therapy consult-need for SNF versus LTAC--spoke with Amiee dter and she is in agreeement with LTAC Palliative care has been consulted  CODE STATUS: Full code  TOTAL TIME TAKING CARE OF THIS PATIENT: 20 minutes.   POSSIBLE D/C IN ?  DAYS, DEPENDING ON CLINICAL CONDITION.   Fritzi Mandes M.D on 11/03/2018 at 12:56 PM  Between 7am to 6pm - Pager - [phone]  After 6pm go to [email or web address] - password [email or web address] [facility]  Office  [phone]  CC: Primary care physician; Garwin Brothers, MD

## 2018-11-03 NOTE — Progress Notes (Signed)
   Vonda Antigua, MD [address], [place], [place], [place], [address] [address], [place], [place], [place], [address] Phone: [phone]  Fax: [phone]   Subjective:  Patient resting in bed comfortably  Objective: Exam: Vital signs in last 24 hours: Vitals:   11/02/18 2020 11/02/18 2300 11/03/18 0436 11/03/18 0754  BP: (!) 146/71  140/72 135/82  Pulse: (!) 114 (!) 104 (!) 106 100  Resp: 19  19   Temp: 99.1 F (37.3 C)  99.9 F (37.7 C)   TempSrc: Oral     SpO2: 97%  98% 100%  Weight:   98.1 kg   Height:       Weight change: 0.7 kg  Intake/Output Summary (Last 24 hours) at 11/03/2018 1112 Last data filed at 11/03/2018 0100 Gross per 24 hour  Intake 1183.17 ml  Output 2700 ml  Net -1516.83 ml    General: No acute distress, AAO x3 Abd: Soft, NT/ND, No HSM.  PEG tube in place, with external bumper at 4 cm mark.  Incision site appears well without discharge or erythema or bleeding.  PEG tube bumper withdrawn to 4-1/2 cm mark and gauze removed from underneath the external bumper. Skin: Warm, no rashes Neck: Supple, Trachea midline   Lab Results: Lab Results  Component Value Date   WBC 12.8 (H) 11/01/2018   HGB 10.8 (L) 11/01/2018   HCT 33.7 (L) 11/01/2018   MCV 84.9 11/01/2018   PLT 404 (H) 11/01/2018   Micro Results: No results found for this or any previous visit (from the past 240 hour(s)). Studies/Results: No results found. Medications:  Scheduled Meds: . chlorhexidine  15 mL Mouth Rinse BID  . Chlorhexidine Gluconate Cloth  6 each Topical Q0600  . famotidine  20 mg Oral BID  . feeding supplement (PRO-STAT SUGAR FREE 64)  30 mL Per Tube BID  . free water  150 mL Per Tube Q4H  . heparin  5,000 Units Subcutaneous Q8H  . levETIRAcetam  500 mg Per Tube BID  . mouth rinse  15 mL Mouth Rinse q12n4p  . metoprolol tartrate  25 mg Per Tube BID  . modafinil  200 mg Per Tube BID  . nutrition supplement (JUVEN)  1 packet Per Tube BID BM    Continuous Infusions: . sodium chloride 0 mL/hr at 10/30/18 1116  . feeding supplement (OSMOLITE 1.5 CAL) 1,000 mL (11/03/18 0429)   PRN Meds:.sodium chloride, acetaminophen, polyethylene glycol   Assessment: Active Problems:   Sepsis ([place])   Pressure injury of skin    Plan: No abnormality seen at PEG tube site  PEG tube working well  Keep external bumper about 1 finger width, or 1 cm away from external skin.  Now placed at 4-1/2 cm mark to keep it loosely touching skin.  Do not place gauze underneath the external bumper to prevent buried bumper syndrome.  Clean site once daily with soap and water  GI service will sign off, please page with any questions or concerns   LOS: 15 days   Vonda Antigua, MD 11/03/2018, 11:12 AM

## 2018-11-03 NOTE — Progress Notes (Signed)
Pharmacy Electrolyte Monitoring Consult:  Pharmacy consulted to assist in monitoring and replacing electrolytes in this 63 y.o. male admitted on 10/19/2018 with Altered Mental Status   Labs:  Sodium (mmol/L)  Date Value  11/03/2018 138   Potassium (mmol/L)  Date Value  11/03/2018 3.5   Magnesium (mg/dL)  Date Value  11/02/2018 1.9   Phosphorus (mg/dL)  Date Value  11/01/2018 4.3   Calcium (mg/dL)  Date Value  11/03/2018 8.4 (L)   Albumin (g/dL)  Date Value  10/21/2018 2.4 (L)   Corrected Calcium: 9.38  Assessment/Plan: K=3.5,  Scr 0.63 No other electrolyte replenishment warranted at this time.  Patient is on [place].   Will replace for goal potassium ~4, goal magnesium ~ 2, and goal phosphorus ~ 2.5.  Will check electrolytes with am labs  Pharmacy will continue to monitor and adjust per consult.    Noralee Space, PharmD, BCPS Clinical Pharmacist 11/03/2018 10:08 AM

## 2018-11-04 ENCOUNTER — Encounter: Payer: Self-pay | Admitting: Gastroenterology

## 2018-11-04 LAB — BASIC METABOLIC PANEL
Anion gap: 11 (ref 5–15)
BUN: 21 mg/dL (ref 8–23)
CO2: 24 mmol/L (ref 22–32)
Calcium: 8.8 mg/dL — ABNORMAL LOW (ref 8.9–10.3)
Chloride: 102 mmol/L (ref 98–111)
Creatinine, Ser: 0.62 mg/dL (ref 0.61–1.24)
GFR calc Af Amer: >60 mL/min (ref >60)
GFR calc non Af Amer: >60 mL/min (ref >60)
Glucose, Bld: 127 mg/dL — ABNORMAL HIGH (ref 70–99)
Potassium: 4.1 mmol/L (ref 3.5–5.1)
Sodium: 137 mmol/L (ref 135–145)

## 2018-11-04 LAB — GLUCOSE, CAPILLARY
Glucose-Capillary: 101 mg/dL — ABNORMAL HIGH (ref 70–99)
Glucose-Capillary: 102 mg/dL — ABNORMAL HIGH (ref 70–99)
Glucose-Capillary: 130 mg/dL — ABNORMAL HIGH (ref 70–99)
Glucose-Capillary: 112 mg/dL — ABNORMAL HIGH (ref 70–99)
Glucose-Capillary: 134 mg/dL — ABNORMAL HIGH (ref 70–99)

## 2018-11-04 LAB — MAGNESIUM: Magnesium: 1.9 mg/dL (ref 1.7–2.4)

## 2018-11-04 MED ORDER — ATORVASTATIN CALCIUM 20 MG PO TABS: 40.0000 mg | ORAL_TABLET | Freq: Every evening | ORAL | Status: DC

## 2018-11-04 MED ORDER — DOXAZOSIN MESYLATE 2 MG PO TABS: 2.0000 mg | ORAL_TABLET | Freq: Every day | ORAL | Status: DC

## 2018-11-04 MED ORDER — DOXAZOSIN MESYLATE 2 MG PO TABS
2.0000 mg | ORAL_TABLET | Freq: Every day | ORAL | Status: DC
  Administered 2018-11-04 – 2018-11-27 (×23): 2 mg
  Filled 2018-11-04 (×25): qty 1

## 2018-11-04 MED ORDER — ATORVASTATIN CALCIUM 20 MG PO TABS
40.0000 mg | ORAL_TABLET | Freq: Every evening | ORAL | Status: DC
  Administered 2018-11-04 – 2018-11-15 (×12): 40 mg
  Filled 2018-11-04 (×12): qty 2

## 2018-11-04 MED ORDER — CARVEDILOL 25 MG PO TABS
25.0000 mg | ORAL_TABLET | Freq: Two times a day (BID) | ORAL | Status: DC
  Administered 2018-11-04 – 2018-11-28 (×48): 25 mg
  Filled 2018-11-04 (×49): qty 1

## 2018-11-04 MED ORDER — CARVEDILOL 25 MG PO TABS: 25.0000 mg | ORAL_TABLET | Freq: Two times a day (BID) | ORAL | Status: DC

## 2018-11-04 NOTE — Progress Notes (Signed)
[facility] [place] NAME: [name]    MR#:  [id number]  DATE OF BIRTH:  08/04/1955  SUBJECTIVE:   -pt opens eyes on VC. Tracks with eyes in the room. Non verbal -s/p PEG tube placement  REVIEW OF SYSTEMS:  Review of Systems  Unable to perform ROS: Mental status change    DRUG ALLERGIES:   Allergies  Allergen Reactions  . Metformin Diarrhea    VITALS:  Blood pressure 129/75, pulse 100, temperature 98.5 F (36.9 C), temperature source Axillary, resp. rate 16, height 6' (1.829 m), weight 97 kg, SpO2 95 %.  PHYSICAL EXAMINATION:  Physical Exam   GENERAL:  63 y.o.-year-old ill-appearing patient lying in the bed with no acute distress. Looks chronically ill EYES: Pupils equal, round, reactive to light and accommodation. No scleral icterus. Extraocular muscles intact.  Has a right-sided gaze HEENT: Head atraumatic, normocephalic. Oropharynx and nasopharynx clear.  NECK:  Supple, no jugular venous distention. No thyroid enlargement, no tenderness.  LUNGS: Normal breath sounds bilaterally, no wheezing, rales,rhonchi or crepitation. No use of accessory muscles of respiration.  CARDIOVASCULAR: S1, S2 normal. No murmurs, rubs, or gallops.  ABDOMEN: Soft, nontender, nondistended. Bowel sounds present. No organomegaly or mass. Foley+  PEG+ EXTREMITIES: No pedal edema, cyanosis, or clubbing.  NEUROLOGIC: No obvious facial droop noted.Marland Kitchen able to track some, opening eyes to [facility]   Not following any commands.  Alert but non verbal.   Sensation intact. Gait not checked.  -Responding to touch, withdrawing.  Has 2+ symmetric bilateral lower extremity reflexes PSYCHIATRIC: The patient is alert but not oriented, remains nonverbal SKIN: No obvious rash, lesion, or ulcer.    LABORATORY PANEL:   CBC Recent Labs  Lab 11/01/18 0532  WBC 12.8*  HGB 10.8*  HCT 33.7*  PLT 404*    ------------------------------------------------------------------------------------------------------------------  Chemistries  Recent Labs  Lab 11/04/18 0611  NA 137  K 4.1  CL 102  CO2 24  GLUCOSE 127*  BUN 21  CREATININE 0.62  CALCIUM 8.8*  MG 1.9   ------------------------------------------------------------------------------------------------------------------  Cardiac Enzymes No results for input(s): TROPONINI in the last 168 hours. ------------------------------------------------------------------------------------------------------------------  RADIOLOGY:  No results found.  EKG:   Orders placed or performed in visit on 04/13/17  . 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  1. Acute encephalopathy-initially secondary to hypoglycemia, currently extubated and sugars are improved. -Has bilateral encephalomalacia based on CT head.  Prior to left frontal craniotomy done.  Prior history of strokes. -Has right anterior cerebral artery aneurysm embolization in the past and clipping recently this year. -MRI cannot be done due to recent clipping. -Appreciate neurology consult.  Provigil dose has been increased without significant change.  Remains alert but not following commands. -Very slow recovery expected at this point.  LTAC recommended.   -S/P PEG placement on June 5th 2020-PEG feeding going well  2.  Sepsis-secondary to urinary tract infection.  All cultures have been negative.  Off pressors.  Off steroids. --Finished broad-spectrum antibiotics  3.  Acute renal failure-secondary to acute urinary retention with history of BPH -Appreciate nephrology consult.  Bilateral hydronephrosis on renal ultrasound. -  Status post Foley cath -Renal function is normalized at this time  4.  Poor oral intake-  on tube feeds via NG tube.  However.  NG tube has been pulled out twice for now.  Has been replaced. -GI has been consulted now s/p PEG tube placement sent mental status recovery will not be sooner according to neurology. -  Patient had a G-tube and tracheostomy which was recently removed couple of months ago and patient was eating by mouth.   5.  Seizure disorder-prophylactically on Keppra.  6.  DVT prophylaxis-subcutaneous heparin   - Pt was denied LTAC services per CSW. He was from [facility]. CSW working with dter for SNF d/c planning. Pt at present baseline (best new) for d/c -spoke with Amiee dter  yday   CODE STATUS: Full code  TOTAL TIME TAKING CARE OF THIS PATIENT: 25 minutes.   POSSIBLE D/C IN ?  DAYS, DEPENDING ON CLINICAL CONDITION.   Fritzi Mandes M.D on 11/04/2018 at 12:45 PM  Between 7am to 6pm - Pager - [phone]  After 6pm go to [email or web address] - password [email or web address] [facility]  Office  [phone]  CC: Primary care physician; Garwin Brothers, MD

## 2018-11-04 NOTE — Progress Notes (Signed)
PT Cancellation Note  Patient Details Name: [name] MRN: [id number] DOB: February 23, 1956   Cancelled Treatment:    Reason Eval/Treat Not Completed: Fatigue/lethargy limiting ability to participate(Consult received and chart reviewed.  Patient sleeping soundly; unable to awaken for meaningful participation with session despite touch, verbal cuing and sternal rub.  RN informed/aware.  Will re-attempt at later time/date as medically appropriate and able to actively participate. Per discussion, patient does have notable sleep/wake cycles, but not consistent and unable to schedule around.  Will continue efforts as appropriate.)   Meilani Edmundson H. Owens Shark, PT, DPT, NCS 11/04/18, 11:26 AM [phone]

## 2018-11-04 NOTE — TOC Progression Note (Signed)
Transition of Care [facility]) - Progression Note    Patient Details  Name: ABDULKADIR EMMANUEL MRN: [id number] Date of Birth: 1956/04/30  Transition of Care [facility]) CM/SW [place], [place] Phone Number: 11/04/2018, 4:10 PM  Clinical Narrative:   CSW spoke with patient's daughter [name] regarding discharge plan. CSW explained that insurance will not approve patient to go to LTAC. Daughter is agreeable to SNF but does not want to go back to [facility]. Daughter requested that bed search be sent further out into [place], [place] and [place]. CSW extended bed search and explained that patient is unlikely to get many other bed offers. CSW will continue to follow for discharge planning.     Expected Discharge Plan: Skilled Nursing Facility Barriers to Discharge: Insurance Authorization  Expected Discharge Plan and Services Expected Discharge Plan: [place]                                               Social Determinants of Health (SDOH) Interventions    Readmission Risk Interventions No flowsheet data found.

## 2018-11-04 NOTE — Care Management Important Message (Signed)
Important Message  Patient Details  Name: [name] MRN: [id number] Date of Birth: November 11, 1955   Medicare Important Message Given:  Yes    [name] [name] 11/04/2018, 12:12 PM

## 2018-11-04 NOTE — Progress Notes (Signed)
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 who presented from his facility unresponsive requiring him to be ventilated in the ED. Patient has been seen in previous years at behavioral health for suicide ideation.    OT comments  Pt seen for OT treatment on this date. Upon arrival to room pt was supine in bed with eyes open, however did not verbalize or appear to register this therapist in the room. Pt. tolerated PROM for BUEs to decrease risk of contractures, joint stiffness, and to promote good skin integrity. Pt. Presents with flexor tightness in the LUE and bilat MCPs.  Pt. was lethargic, however did follow this therapist with his eyes t/o session. Pt appeared to grimace/pull away during LUE PROM (MCP extension) this appeared to be a pain response and did not re-occur during session, however this therapist limited PROM of L MCPs for pt comfort. RN notified of increased flexion tightness and encouraged to engage in PROM of BUE without mitts as able. Pt maintained open mouth posture t/o session but did not verbalize or otherwise participate in session. Pt. continues to benefit from OT services for ADL training, UE there. Ex., positioning, and pt. Education.  Will continue to follow POC as written. Frequency and DC recommendation remain appropriate.   Follow Up Recommendations  SNF;LTACH    Equipment Recommendations  None recommended by OT    Recommendations for Other Services      Precautions / Restrictions Precautions Precautions: Fall Restrictions Weight Bearing Restrictions: No       Mobility Bed Mobility Overal bed mobility: Needs Assistance             General bed mobility  comments: Total A x2 for any bed mobility  Transfers                 General transfer comment: not safe at this time    Balance Overall balance assessment: Needs assistance     Sitting balance - Comments: not able to assess, dependent transfers                                   ADL either performed or assessed with clinical judgement   ADL Overall ADL's : Needs assistance/impaired                                       General ADL Comments: total dependence for all ADLs, Pt. is NPO     Vision Patient Visual Report: Other (comment)(Unable to assess 2/2 pt level of conciousness) Vision Assessment?: Vision impaired- to be further tested in functional context   Perception     Praxis      Cognition Arousal/Alertness: Lethargic Behavior During Therapy: Flat affect Overall Cognitive Status: Difficult to assess Area of Impairment: Following commands                               General Comments: Did not verbalize or attempt to communicate in anyway. Unable to follow simple commands.  Exercises General Exercises - Upper Extremity Shoulder Flexion: PROM;Both;10 reps Elbow Flexion: PROM;Both;10 reps Elbow Extension: Both;PROM;10 reps Wrist Flexion: PROM;Both;10 reps Wrist Extension: PROM;10 reps;Both Other Exercises Other Exercises: BUE PROM to minimize risk of joint stiffness and contracture   Shoulder Instructions       General Comments NG tube in place, mitts on hands at start and end of session.     Pertinent Vitals/ Pain       Faces Pain Scale: No hurt Pain Location: Pt unable to state. Monitored for discomfort t/o session.  Pain Intervention(s): Monitored during session  Home Living                                          Prior Functioning/Environment              Frequency  Min 1X/week        Progress Toward Goals  OT Goals(current goals can now be found in the care  plan section)     Acute Rehab OT Goals OT Goal Formulation: Patient unable to participate in goal setting  Plan Discharge plan remains appropriate;Frequency remains appropriate    Co-evaluation                 AM-PAC OT "6 Clicks" Daily Activity     Outcome Measure   Help from another person eating meals?: Total Help from another person taking care of personal grooming?: Total Help from another person toileting, which includes using toliet, bedpan, or urinal?: Total Help from another person bathing (including washing, rinsing, drying)?: Total Help from another person to put on and taking off regular upper body clothing?: Total Help from another person to put on and taking off regular lower body clothing?: Total 6 Click Score: 6    End of Session    OT Visit Diagnosis: Other abnormalities of gait and mobility (R26.89);Cognitive communication deficit (R41.841);Muscle weakness (generalized) (M62.81) Symptoms and signs involving cognitive functions: Other cerebrovascular disease   Activity Tolerance Patient limited by lethargy   Patient Left in bed;with call bell/phone within reach;with bed alarm set   Nurse Communication (Pt with noted stiffness t/o hands. Encourage PROM with mitts off when able)        Time: 0307-0324 OT Time Calculation (min): 17 min  Charges: OT General Charges $OT Visit: 1 Visit OT Treatments $Therapeutic Exercise: 8-22 mins  Shara Blazing, M.S., OTR/L Ascom: [phone] 11/04/18, 4:17 PM

## 2018-11-04 NOTE — Plan of Care (Signed)

## 2018-11-04 NOTE — TOC Progression Note (Addendum)
Transition of Care [facility]) - Progression Note    Patient Details  Name: [name] MRN: [id number] Date of Birth: 02-16-1956  [facility] CM/SW Contact  Marshell Garfinkel, RN Phone Number: 11/04/2018, 8:40 AM  Clinical Narrative:    RNCM received notification from [place] with [facility] that he has NOT been denied for LTAC however, his insurance is requesting peer to peer.  This RNCM will provide this information to MD.CSW updated. Update at 0900: Callback from [place] with [facility] stating that after review, patient now has permanent feeding tube and they will not be able to accept.  She states she will update daughter. Patient is more appropriate for SNF. He has a [employer] Dual Eligibility Special Needs policy effective since 02/26/18.  $0 copay for days 1-100.  Per rep, he's used 11 days in the current benefit period (09/16/18 - 09/26/18).  Precert required at [phone].  It seems he has some sort of Medicaid based on not meeting anything so far towards his $198 deductible or $6700 out of pocket max.   Expected Discharge Plan: Skilled Nursing Facility Barriers to Discharge: Other (comment)(long term feeding means)  Expected Discharge Plan and Services Expected Discharge Plan: [employer]                                               Social Determinants of Health (SDOH) Interventions    Readmission Risk Interventions No flowsheet data found.

## 2018-11-04 NOTE — Progress Notes (Signed)
Pharmacy Electrolyte Monitoring Consult:  Pharmacy consulted to assist in monitoring and replacing electrolytes in this 63 y.o. male admitted on 10/19/2018 with Altered Mental Status   Labs:  Sodium (mmol/L)  Date Value  11/04/2018 137   Potassium (mmol/L)  Date Value  11/04/2018 4.1   Magnesium (mg/dL)  Date Value  11/04/2018 1.9   Phosphorus (mg/dL)  Date Value  11/01/2018 4.3   Calcium (mg/dL)  Date Value  11/04/2018 8.8 (L)   Albumin (g/dL)  Date Value  10/21/2018 2.4 (L)   Corrected Calcium: 10.08  Assessment/Plan: K=4.1,  Scr 0.62 No other electrolyte replenishment warranted x multiple days - pharmacy will sign off at this time.  Patient is on [place].   Lu Duffel, PharmD, BCPS Clinical Pharmacist 11/04/2018 7:01 AM

## 2018-11-04 NOTE — Care Management (Deleted)
RNCM received notification from [place] with [facility] that he has NOT been denied for LTAC however, his insurance is requesting peer to peer.  This RNCM will provide this information to MD.CSW updated.

## 2018-11-05 LAB — GLUCOSE, CAPILLARY
Glucose-Capillary: 119 mg/dL — ABNORMAL HIGH (ref 70–99)
Glucose-Capillary: 124 mg/dL — ABNORMAL HIGH (ref 70–99)
Glucose-Capillary: 137 mg/dL — ABNORMAL HIGH (ref 70–99)
Glucose-Capillary: 137 mg/dL — ABNORMAL HIGH (ref 70–99)
Glucose-Capillary: 147 mg/dL — ABNORMAL HIGH (ref 70–99)
Glucose-Capillary: 157 mg/dL — ABNORMAL HIGH (ref 70–99)

## 2018-11-05 LAB — CBC
HCT: 31.3 % — ABNORMAL LOW (ref 39.0–52.0)
Hemoglobin: 9.7 g/dL — ABNORMAL LOW (ref 13.0–17.0)
MCH: 27.2 pg (ref 26.0–34.0)
MCHC: 31 g/dL (ref 30.0–36.0)
MCV: 87.7 fL (ref 80.0–100.0)
Platelets: 371 10*3/uL (ref 150–400)
RBC: 3.57 MIL/uL — ABNORMAL LOW (ref 4.22–5.81)
RDW: 16.7 % — ABNORMAL HIGH (ref 11.5–15.5)
WBC: 8.6 10*3/uL (ref 4.0–10.5)
nRBC: 0 % (ref 0.0–0.2)

## 2018-11-05 NOTE — Progress Notes (Signed)
PT Cancellation Note  Patient Details Name: CAI ANFINSON MRN: [id number] DOB: [birth year]   Cancelled Treatment:    Reason Eval/Treat Not Completed: Fatigue/lethargy limiting ability to participate(Patient sleeping soundly; unable to awaken for meaningful participation with session despite touch, verbal cuing and sternal rub.  RN informed/aware.  Will re-attempt at later time/date as medically appropriate. )  Janna Arch, PT, DPT   11/05/2018, 9:48 AM

## 2018-11-05 NOTE — Progress Notes (Signed)
[facility] [place] NAME: [name]    MR#:  [id number]  DATE OF BIRTH:  27-Aug-1955  SUBJECTIVE:   -pt opens eyes on VC. Tracks with eyes in the room. Non verbal -s/p PEG tube placement  REVIEW OF SYSTEMS:  Review of Systems  Unable to perform ROS: Mental status change    DRUG ALLERGIES:   Allergies  Allergen Reactions  . Metformin Diarrhea    VITALS:  Blood pressure 119/71, pulse 93, temperature 98.7 F (37.1 C), temperature source Axillary, resp. rate 17, height 6' (1.829 m), weight 97 kg, SpO2 95 %.  PHYSICAL EXAMINATION:  Physical Exam   GENERAL:  63 y.o.-year-old ill-appearing patient lying in the bed with no acute distress. Looks chronically ill EYES: Pupils equal, round, reactive to light and accommodation. No scleral icterus. Extraocular muscles intact.  Has a right-sided gaze HEENT: Head atraumatic, normocephalic. Oropharynx and nasopharynx clear.  NECK:  Supple, no jugular venous distention. No thyroid enlargement, no tenderness.  LUNGS: Normal breath sounds bilaterally, no wheezing, rales,rhonchi or crepitation. No use of accessory muscles of respiration.  CARDIOVASCULAR: S1, S2 normal. No murmurs, rubs, or gallops.  ABDOMEN: Soft, nontender, nondistended. Bowel sounds present. No organomegaly or mass. Foley+  PEG+ EXTREMITIES: No pedal edema, cyanosis, or clubbing.  NEUROLOGIC: No obvious facial droop noted.Marland Kitchen able to track some, opening eyes to [facility]   Not following any commands.  Alert but non verbal.   Sensation intact. Gait not checked.  -Responding to touch, withdrawing.  Has 2+ symmetric bilateral lower extremity reflexes PSYCHIATRIC: The patient is alert but not oriented, remains nonverbal SKIN: No obvious rash, lesion, or ulcer--details per RN assessment   LABORATORY PANEL:   CBC Recent Labs  Lab 11/05/18 0509  WBC 8.6  HGB 9.7*  HCT 31.3*  PLT 371    ------------------------------------------------------------------------------------------------------------------  Chemistries  Recent Labs  Lab 11/04/18 0611  NA 137  K 4.1  CL 102  CO2 24  GLUCOSE 127*  BUN 21  CREATININE 0.62  CALCIUM 8.8*  MG 1.9   ------------------------------------------------------------------------------------------------------------------  Cardiac Enzymes No results for input(s): TROPONINI in the last 168 hours. ------------------------------------------------------------------------------------------------------------------  RADIOLOGY:  No results found.  EKG:   Orders placed or performed in visit on 04/13/17  . 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  1. Acute encephalopathy-initially secondary to hypoglycemia, currently extubated -Has bilateral encephalomalacia based on CT head.  Prior to left frontal craniotomy done.  Prior history of strokes. -Has right anterior cerebral artery aneurysm embolization in the past and clipping recently this year. -MRI cannot be done due to recent clipping. -Appreciate neurology consult.  Provigil dose has been increased without significant change.  Remains alert but not following commands. -Very slow recovery expected at this point.   -S/P PEG placement on June 5th 2020-PEG feeding going well  2.  Sepsis-secondary to urinary tract infection.  All cultures have been negative.  Off pressors.  Off steroids. --Finished broad-spectrum antibiotics  3.  Acute renal failure-secondary to acute urinary retention with history of BPH -Appreciate nephrology consult.  Bilateral hydronephrosis on renal ultrasound. - Status post Foley cath -Renal function is  normalized at this time  4.  Poor oral intake- on tube feeds via NG  tube.  However.  NG tube has been pulled out twice for now.  Has been replaced. -GI has been consulted now s/p PEG tube placement sent mental status recovery will not be sooner according to neurology. - Patient had a G-tube and tracheostomy which was recently removed couple of months ago and patient was eating by mouth.   5.  Seizure prevention -prophylactically on Keppra.  6.  DVT prophylaxis-subcutaneous heparin   - Pt was denied LTAC services per CSW. He was from [facility]. CSW working with dter for SNF d/c planning. Pt at present baseline (best new) for d/c -spoke with Amiee dter  yday   CODE STATUS: Full code  TOTAL TIME TAKING CARE OF THIS PATIENT: 25 minutes.   POSSIBLE D/C IN ?  DAYS, DEPENDING ON CLINICAL CONDITION.   Fritzi Mandes M.D on 11/05/2018 at 9:25 AM  Between 7am to 6pm - Pager - [phone]  After 6pm go to [email or web address] - password [email or web address] [facility]  Office  [phone]  CC: Primary care physician; Garwin Brothers, MD

## 2018-11-05 NOTE — Progress Notes (Addendum)
Daily Progress Note   Patient Name: [name]       Date: 11/05/2018 DOB: [birth year]  Age: 63 y.o. MRN#: [id number] Attending Physician: Fritzi Mandes, MD Primary Care Physician: Garwin Brothers, MD Admit Date: 10/19/2018  Reason for Consultation/Follow-up: Establishing goals of care  Subjective: Patient asleep, opens eyes to voice commands. Non verbal at baseline. Will track with eyes. Mitts on. S/P PEG tube placement. Appears to be tolerating well with rate @ 1ml/hr.   Daughter continues to request full aggressive treatment and remains hopeful for some improvement and contentment if this is indeed his new norm. Feels that his progress will be slow if any improvement. She continues to request placement and is hopeful for a facility other than [facility] however, no other bed offers at this time. Agreeable to outpatient palliative at discharge.   Length of Stay: 17  Current Medications: Scheduled Meds:  . atorvastatin  40 mg Per Tube QPM  . carvedilol  25 mg Per Tube BID WC  . chlorhexidine  15 mL Mouth Rinse BID  . Chlorhexidine Gluconate Cloth  6 each Topical Q0600  . doxazosin  2 mg Per Tube QHS  . famotidine  20 mg Oral BID  . feeding supplement (PRO-STAT SUGAR FREE 64)  30 mL Per Tube BID  . free water  150 mL Per Tube Q4H  . heparin  5,000 Units Subcutaneous Q8H  . levETIRAcetam  500 mg Per Tube BID  . mouth rinse  15 mL Mouth Rinse q12n4p  . modafinil  200 mg Per Tube BID  . nutrition supplement (JUVEN)  1 packet Per Tube BID BM    Continuous Infusions: . sodium chloride 0 mL/hr at 10/30/18 1116  . feeding supplement (OSMOLITE 1.5 CAL) 1,000 mL (11/03/18 0429)    PRN Meds: sodium chloride, acetaminophen, polyethylene glycol  Physical Exam Vitals signs and nursing  note reviewed.  Constitutional:      General: He is sleeping.     Appearance: He is ill-appearing.  Cardiovascular:     Rate and Rhythm: Normal rate and regular rhythm.     Pulses: Normal pulses.     Heart sounds: Normal heart sounds.  Pulmonary:     Effort: Pulmonary effort is normal.     Breath sounds: Decreased breath sounds present.  Comments: 5L/[place]  Abdominal:     Palpations: Abdomen is soft.     Comments: S/p PEG  Neurological:     Mental Status: He is easily aroused.     Comments: Will not follow commands, will track with eyes, nonverbal   Psychiatric:        Speech: He is noncommunicative.            Vital Signs: BP 119/71 (BP Location: Left Arm)   Pulse 93   Temp 98.7 F (37.1 C) (Axillary)   Resp 17   Ht 6' (1.829 m)   Wt 97 kg   SpO2 95%   BMI 29.00 kg/m  SpO2: SpO2: 95 % O2 Device: O2 Device: Room Air O2 Flow Rate: O2 Flow Rate (L/min): 5 L/min  Intake/output summary:   Intake/Output Summary (Last 24 hours) at 11/05/2018 1036 Last data filed at 11/05/2018 0409 Gross per 24 hour  Intake -  Output 2050 ml  Net -2050 ml   LBM: Last BM Date: 11/03/18 Baseline Weight: Weight: 113.4 kg Most recent weight: Weight: 97 kg       Palliative Assessment/Data:S/P PEG    Flowsheet Rows     Most Recent Value  Intake Tab  Referral Department  Hospitalist  Unit at Time of Referral  Med/Surg Unit  Date Notified  10/31/18  Palliative Care Type  New Palliative care  Reason for referral  Clarify Goals of Care  Date of Admission  10/19/18  Date first seen by Palliative Care  10/31/18  # of days Palliative referral response time  0 Day(s)  # of days IP prior to Palliative referral  12  Clinical Assessment  Psychosocial & Spiritual Assessment  Palliative Care Outcomes      Patient Active Problem List   Diagnosis Date Noted  . Pressure injury of skin 10/23/2018  . Sepsis ([place]) 10/19/2018  . Severe major depression ([place]) 04/14/2017  . Major depressive  disorder, recurrent ([place]) 04/09/2017  . MDD (major depressive disorder), recurrent severe, without psychosis ([place]) 04/09/2017  . Family history of brain aneurysm   . Intractable vomiting 10/20/2016  . History of intracranial aneurysm   . Hypokalemia   . AKI (acute kidney injury) ([place])   . Prolonged QT interval   . Lactic acidosis   . Dysphagia   . Esophageal ring   . Gastroesophageal reflux disease with esophagitis   . Nausea and vomiting 10/01/2016  . 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 Severe episode of recurrent major depressive disorder, without psychotic features ([place]) 07/20/2016  . CVA (cerebral vascular accident) ([place]) 06/15/2016  . Abdominal pain   . Numbness   . Carpal tunnel syndrome, bilateral 12/16/2015  . Status post stroke 11/12/2015  . Transient neurologic deficit 10/15/2015  . Cerebrovascular accident, late effects 10/07/2015  . Complicated migraine   . Anxiety state   . 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 Cerebral infarction due to embolism of right middle cerebral artery ([place]) 09/23/2015  . Cerebral infarction due to embolism of cerebral artery ([place]) 09/23/2015  . Gait disturbance, post-stroke   . Drooping of mouth   . Cerebrovascular accident (CVA) due to embolism of right anterior cerebral artery ([place])   . Benign essential HTN   . Migraine with aura and without status migrainosus, not intractable   . Tachypnea   . Prediabetes   . Acute blood loss anemia   . Brain aneurysm 09/20/2015  . Type 2 diabetes mellitus ([place]) 09/15/2015  . Leukocytosis 09/15/2015  . Aneurysm, cerebral,  nonruptured 09/15/2015  . TIA (transient ischemic attack) 09/14/2015  . Temporary cerebral vascular dysfunction 09/14/2015  . H/O transient cerebral ischemia 07/29/2015  . At risk for falling 07/29/2015  . Bilateral hearing loss 10/25/2014  . Abnormal fear 08/08/2012  . Headache, migraine 08/08/2012  . Arthritis, degenerative 08/08/2012  . History of tear of ACL (anterior  cruciate ligament) 03/08/2011  . History of knee problem 03/08/2011  . Depression 09/07/2010  . Depressive disorder, not elsewhere classified 09/07/2010  . URI (upper respiratory infection) 08/24/2010  . Hearing loss 09/06/2006  . Hyperlipidemia 04/23/2006  . Essential hypertension 04/23/2006  . Psychophysiological insomnia 04/23/2006    Palliative Care Assessment & Plan   Patient Profile:  Palliative Care consult requested for this 63 y.o. male with multiple medical problems including  Hypertension, CVA, depression, drug abuse, brain aneurysm s/p clipping and craniotomy, tracheostomy, PEG, and hyperlipidemia. He presented to ED with blood sugar of 34 from [facility] facility. He was unresponsive and required intubation for airway protection. He was successfully extubated, yet continues to have poor nutritional intake requiring a NG tube and remains alert but unable to follow commands.   Assessment: Acute encephalopathy Sepsis Acute renal failure  Poor oral intake Seizure disorder  Recommendations/Plan:  Full Code  Continue to treat, aggressive medical interventions (PEG)  Outpatient palliative at discharge  PMT will continue to support and follow  Goals of Care and Additional Recommendations:  Limitations on Scope of Treatment: Full Scope Treatment  Code Status:    Code Status Orders  (From admission, onward)         Start     Ordered   10/19/18 1309  Full code  Continuous     10/19/18 1308        Code Status History    Date Active Date Inactive Code Status Order ID Comments User Context   04/14/2017 2026 04/16/2017 1715 Full Code [id number]  Rozetta Nunnery, NP Inpatient   04/13/2017 1605 04/14/2017 1823 Full Code [id number]  Burgess Estelle, MD Inpatient   04/09/2017 1614 04/13/2017 1044 Full Code [id number]  Ethelene Hal, NP Inpatient   04/08/2017 1824 04/09/2017 1557 Full Code [id number]  [place], Leonard, PA-C ED   04/02/2017 0426 04/03/2017  1829 Full Code [id number]  Recardo Evangelist, PA-C ED   03/12/2017 2121 03/13/2017 1342 Full Code [id number]  Orlie Dakin, MD ED   10/20/2016 1921 10/24/2016 1915 Full Code [id number]  Sela Hilding, MD Inpatient   10/01/2016 1632 10/05/2016 1633 Full Code [id number]  Tonette Bihari, MD ED   06/15/2016 1828 06/16/2016 2021 Full Code [id number]  Lavina Hamman, MD ED   12/28/2015 1729 12/31/2015 1826 Full Code [id number]  Luanne Bras, MD Inpatient   12/24/2015 1702 12/28/2015 1729 Full Code [id number]  Rondel Jumbo, PA-C ED   09/23/2015 1522 09/30/2015 1324 Full Code [id number]  Cathlyn Parsons, PA-C Inpatient   09/23/2015 1522 09/23/2015 1522 Full Code [id number]  Cathlyn Parsons, PA-C Inpatient   09/20/2015 1835 09/23/2015 1522 Full Code [id number]  Luanne Bras, MD Inpatient   09/15/2015 0009 09/17/2015 1519 Full Code [id number]  Reubin Milan, MD ED      Prognosis:   Guarded to Poor  Discharge Planning:  LTAC with outpatient palliative  Care plan was discussed with patient's daughter, [name] and bedside RN.   The above conversation was completed via telephone due to the visitor restrictions during the COVID-19 pandemic. Thorough chart review and discussion with necessary members of the care  team was completed as part of assessment.   Thank you for allowing the Palliative Medicine Team to assist in the care of this patient.  Total Time: 31min.   Greater than 50%  of this time was spent counseling and coordinating care related to the above assessment and plan.  Alda Lea, AGPCNP-BC Palliative Medicine Team  Phone: [phone] Pager: [phone] Amion: Bjorn Pippin     Please contact Palliative Medicine Team phone at [phone] for questions and concerns.

## 2018-11-06 LAB — GLUCOSE, CAPILLARY
Glucose-Capillary: 117 mg/dL — ABNORMAL HIGH (ref 70–99)
Glucose-Capillary: 125 mg/dL — ABNORMAL HIGH (ref 70–99)
Glucose-Capillary: 126 mg/dL — ABNORMAL HIGH (ref 70–99)
Glucose-Capillary: 129 mg/dL — ABNORMAL HIGH (ref 70–99)
Glucose-Capillary: 131 mg/dL — ABNORMAL HIGH (ref 70–99)
Glucose-Capillary: 141 mg/dL — ABNORMAL HIGH (ref 70–99)

## 2018-11-06 MED ORDER — FAMOTIDINE 40 MG/5ML PO SUSR
20.0000 mg | Freq: Two times a day (BID) | ORAL | Status: DC
  Administered 2018-11-06 – 2018-11-28 (×45): 20 mg
  Filled 2018-11-06 (×46): qty 2.5

## 2018-11-06 NOTE — Progress Notes (Signed)
Pt quiet today no resp distress.  Pt  Follows you with  His eyes  And looks at you  But no verbal comm.  meds via  Peg. Dressing d/i.  tol feeds and flush well.

## 2018-11-06 NOTE — Progress Notes (Signed)
Patient ID: [name], male   DOB: 10-Mar-1956, 63 y.o.   MRN: [id number] I had left message but for daughter Amy to discuss about patient's discharge plan. I have not heard back from her. Social worker Candace also left a message regarding the same have not heard yet.

## 2018-11-06 NOTE — Progress Notes (Signed)
Nutrition Follow-up  RD working remotely.  DOCUMENTATION CODES:   Obesity unspecified  INTERVENTION:  Continue Osmolite 1.5 Cal at goal rate of 60 mL/hr + Pro-Stat 30 mL BID per tube. Provides 2360 kcal, 120 grams of protein, 1094 mL H2O daily.  Continue free water flush of 150 mL Q4hrs. This provides a total of 1994 mL H2O daily including water in tube feeding.  Goal regimen meets 100% RDIs for vitamins/minerals.  Continue Juven BID per tube to promote wound healing.  NUTRITION DIAGNOSIS:   Inadequate oral intake related to inability to eat as evidenced by NPO status.  Ongoing - addressing with TF regimen.  GOAL:   Patient will meet greater than or equal to 90% of their needs  Met with TF regimen.  MONITOR:   Diet advancement, Labs, Weight trends, I & O's, Skin  REASON FOR ASSESSMENT:   Consult Enteral/tube feeding initiation and management  ASSESSMENT:   63 year old male with PMHx of HTN, HLD, depression, anxiety, hx CVA, hx drug abuse admitted with acute mental status change in setting of prior cerebral hemorrhage and CVA and requiring intubation on 5/23 for airway protection, also with urinary retention with acute renal failure.   -Patient s/p placement of PEG tube on 6/5  Patient continues to tolerate goal tube feeding per PEG tube. Per chart discharge planning underway. Family deciding on SNF vs taking patient home.  Enteral Access: 20 Fr. Bard gastrostomy tube placed 6/5 by GI; external bumper at 4.5 cm marking  TF: Osmolite at 60 mL/hr + Pro-Stat 30 mL BID + FWF 150 mL Q4hrs  Medications reviewed and include: famotidine, Keppra, Juven.  Labs reviewed: CBG 117-141.  I/O: 750 ml UOP yesterday (0.3 mL/kg/hr)  Diet Order:   Diet Order    None     EDUCATION NEEDS:   No education needs have been identified at this time  Skin:  Skin Assessment: Skin Integrity Issues:(Stg II coccyx (75% wound base granulating); MSAD buttocks)  Last BM:  11/04/2018 -  large type 6  Height:   Ht Readings from Last 1 Encounters:  11/01/18 6' (1.829 m)   Weight:   Wt Readings from Last 1 Encounters:  11/04/18 97 kg   Ideal Body Weight:  80.9 kg  BMI:  Body mass index is 29 kg/m.  Estimated Nutritional Needs:   Kcal:  2300-2600kcal/day   Protein:  115-130g/day   Fluid:  2 L/day (25 mL/kg IBW)  Willey Blade, MS, RD, LDN Office: [phone] Pager: [phone] After Hours/Weekend Pager: [phone]

## 2018-11-06 NOTE — Progress Notes (Signed)
[place] at [place] NAME: [name]    MR#:  [id number]  DATE OF BIRTH:  [birth year]  SUBJECTIVE:   -pt opens eyes on VC. Tracks with eyes in the room. Non verbal -s/p PEG tube placement No new issues per RN  REVIEW OF SYSTEMS:  Review of Systems  Unable to perform ROS: Mental status change    DRUG ALLERGIES:   Allergies  Allergen Reactions  . Metformin Diarrhea    VITALS:  Blood pressure 125/66, pulse 92, temperature 98.6 F (37 C), temperature source Oral, resp. rate 20, height 6' (1.829 m), weight 97 kg, SpO2 97 %.  PHYSICAL EXAMINATION:  Physical Exam   GENERAL:  63 y.o.-year-old ill-appearing patient lying in the bed with no acute distress. Looks chronically ill EYES: Pupils equal, round, reactive to light and accommodation. No scleral icterus. Extraocular muscles intact.   HEENT: Head atraumatic, normocephalic. Oropharynx and nasopharynx clear.  NECK:  Supple, no jugular venous distention. No thyroid enlargement, no tenderness.  LUNGS: Normal breath sounds bilaterally, no wheezing, rales,rhonchi or crepitation. No use of accessory muscles of respiration.  CARDIOVASCULAR: S1, S2 normal. No murmurs, rubs, or gallops.  ABDOMEN: Soft, nontender, nondistended. Bowel sounds present. No organomegaly or mass. Foley+  PEG+ EXTREMITIES: No pedal edema, cyanosis, or clubbing.  NEUROLOGIC: No obvious facial droop noted.Marland Kitchen able to track some, opening eyes to [facility]   Not following any commands.  Alert but non verbal.   Sensation intact. Gait not checked.  -Responding to touch, withdrawing.  Has 2+ symmetric bilateral lower extremity reflexes PSYCHIATRIC: The patient is alert but not oriented, remains nonverbal SKIN: No obvious rash, lesion, or ulcer--details per RN assessment   LABORATORY PANEL:   CBC Recent Labs  Lab 11/05/18 0509  WBC 8.6  HGB 9.7*  HCT 31.3*  PLT 371    ------------------------------------------------------------------------------------------------------------------  Chemistries  Recent Labs  Lab 11/04/18 0611  NA 137  K 4.1  CL 102  CO2 24  GLUCOSE 127*  BUN 21  CREATININE 0.62  CALCIUM 8.8*  MG 1.9   ------------------------------------------------------------------------------------------------------------------  Cardiac Enzymes No results for input(s): TROPONINI in the last 168 hours. ------------------------------------------------------------------------------------------------------------------  RADIOLOGY:  No results found.  EKG:   Orders placed or performed in visit on 04/13/17  . 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  1. Acute encephalopathy-suspected secondary to hypoglycemia when pt presented to the hospital -pt required mechanical ventilation -Has bilateral encephalomalacia based on CT head.  Prior to left frontal craniotomy done.  Prior history of strokes. -Has right anterior cerebral artery aneurysm embolization in the past and clipping recently this year. -MRI cannot be done due to recent clipping. -Appreciate neurology consult.  Provigil dose has been increased without significant change.  Remains alert but not following commands. -Very slow recovery expected at this point.   -S/P PEG placement on June 5th 2020-PEG feeding going well  2.  Sepsis-secondary to urinary tract infection.  All cultures have been negative.  Off pressors.  Off steroids. --Finished broad-spectrum antibiotics  3.  Acute renal failure-secondary to acute urinary retention with history of BPH -Appreciate nephrology consult.  Bilateral hydronephrosis on renal  ultrasound. - Status post Foley cath -Renal function is normalized at this time  4.  Poor oral intake - now s/p PEG tube placement since mental status recovery will not be sooner according to neurology. - Patient had a G-tube and tracheostomy which was recently removed couple of months ago and patient was eating by mouth.  -PEG feeding going well  5.  Seizure prevention -prophylactically on Keppra.  6.  DVT prophylaxis-subcutaneous heparin   - Pt was denied LTAC services per CSW. He was from [facility] CSW working with dter for SNF d/c planning. Pt at present baseline (best new) for d/c -left msg for [name] to call me regarding pt's d/c planning   CODE STATUS: Full code  TOTAL TIME TAKING CARE OF THIS PATIENT: 25 minutes.   POSSIBLE D/C IN ?  DAYS, DEPENDING ON CLINICAL CONDITION.   Fritzi Mandes M.D on 11/06/2018 at 8:38 AM  Between 7am to 6pm - Pager - [phone]  After 6pm go to [email or web address] - password [email or web address] [facility]  Office  [phone]  CC: Primary care physician; Garwin Brothers, MD

## 2018-11-06 NOTE — Plan of Care (Signed)

## 2018-11-06 NOTE — TOC Progression Note (Signed)
Transition of Care [facility]) - Progression Note    Patient Details  Name: RIYAD KEENA MRN: [id number] Date of Birth: Aug 05, 1955  Transition of Care [facility]) CM/SW [place], [place] Phone Number: 11/06/2018, 1:18 PM  Clinical Narrative:   CSW left voicemail for patient's daughter Aimee regarding bed offers. Patient has received 2 other bed offers. CSW waiting for a return call from daughter to determine if she would like to accept a bed offer or has decided to take patient home with care. TOC team will continue to follow for discharge planning.     Expected Discharge Plan: Skilled Nursing Facility Barriers to Discharge: Insurance Authorization  Expected Discharge Plan and Services Expected Discharge Plan: [place]                                               Social Determinants of Health (SDOH) Interventions    Readmission Risk Interventions No flowsheet data found.

## 2018-11-06 NOTE — Care Management Important Message (Signed)
Important Message  Patient Details  Name: [name] MRN: [id number] Date of Birth: 09-Feb-1956   Medicare Important Message Given:  Yes    [name] A [place] Winsor 11/06/2018, 10:42 AM

## 2018-11-07 LAB — GLUCOSE, CAPILLARY
Glucose-Capillary: 119 mg/dL — ABNORMAL HIGH (ref 70–99)
Glucose-Capillary: 124 mg/dL — ABNORMAL HIGH (ref 70–99)
Glucose-Capillary: 128 mg/dL — ABNORMAL HIGH (ref 70–99)
Glucose-Capillary: 130 mg/dL — ABNORMAL HIGH (ref 70–99)
Glucose-Capillary: 138 mg/dL — ABNORMAL HIGH (ref 70–99)
Glucose-Capillary: 139 mg/dL — ABNORMAL HIGH (ref 70–99)

## 2018-11-07 LAB — SARS CORONAVIRUS 2 BY RT PCR (HOSPITAL ORDER, PERFORMED IN ~~LOC~~ HOSPITAL LAB): SARS Coronavirus 2: NEGATIVE

## 2018-11-07 LAB — ABO/RH: ABO/RH(D): A NEG

## 2018-11-07 NOTE — Plan of Care (Signed)

## 2018-11-07 NOTE — TOC Progression Note (Signed)
Transition of Care [facility]) - Progression Note    Patient Details  Name: [name] MRN: [id number] Date of Birth: [birth year]  Transition of Care [facility]) CM/SW [place], [place] Phone Number: 11/07/2018, 8:32 AM  Clinical Narrative:  CSW spoke with patient's daughter Aimee regarding bed offers. CSW presented other bed offers for patient. Daughter states that she wants to look into facilities before making a decision. CSW explained that a decision needs to be made as soon as possible so that insurance approval can be obtained. Daughter states that she will call CSW back today with a decision. CSW will continue to follow for discharge planning.     Expected Discharge Plan: Skilled Nursing Facility Barriers to Discharge: Insurance Authorization  Expected Discharge Plan and Services Expected Discharge Plan: [place]                                               Social Determinants of Health (SDOH) Interventions    Readmission Risk Interventions No flowsheet data found.

## 2018-11-07 NOTE — Progress Notes (Signed)
Pt tol tube feeds  And meds  No distress. Cont to wait for bed placement.

## 2018-11-07 NOTE — TOC Progression Note (Signed)
Transition of Care [facility]) - Progression Note    Patient Details  Name: [name] MRN: [id number] Date of Birth: December 03, 1955  Transition of Care [facility]) CM/SW [place], [place] Phone Number: 11/07/2018, 9:06 AM  Clinical Narrative:   Patient's daughter called CSW and states that they have decided to accept bed offer at [facility]. CSW notified Olivia Mackie at [facility] of bed acceptance. CSW also started [facility]) authorization. CSW will continue to follow for discharge planning.     Expected Discharge Plan: Skilled Nursing Facility Barriers to Discharge: Insurance Authorization  Expected Discharge Plan and Services Expected Discharge Plan: [place]                                               Social Determinants of Health (SDOH) Interventions    Readmission Risk Interventions No flowsheet data found.

## 2018-11-07 NOTE — Progress Notes (Signed)
[place] at [place] NAME: [name]    MR#:  [id number]  DATE OF BIRTH:  05/12/1956  SUBJECTIVE:   -pt opens eyes on VC. Tracks with eyes in the room. Non verbal -s/p PEG tube placement No new issues per RN  REVIEW OF SYSTEMS:  Review of Systems  Unable to perform ROS: Mental status change    DRUG ALLERGIES:   Allergies  Allergen Reactions  . Metformin Diarrhea    VITALS:  Blood pressure 111/70, pulse 87, temperature 98.1 F (36.7 C), resp. rate 17, height 6' (1.829 m), weight 97 kg, SpO2 97 %.  PHYSICAL EXAMINATION:  Physical Exam   GENERAL:  63 y.o.-year-old chronically ill-appearing patient lying in the bed with no acute distress. EYES: Pupils equal, round, reactive to light and accommodation. No scleral icterus.  HEENT: Head atraumatic, normocephalic. Oropharynx and nasopharynx clear.  NECK:  Supple, no jugular venous distention. No thyroid enlargement, no tenderness.  LUNGS: Normal breath sounds bilaterally, no wheezing, rales,rhonchi or crepitation. No use of accessory muscles of respiration.  CARDIOVASCULAR: S1, S2 normal. No murmurs, rubs, or gallops.  ABDOMEN: Soft, nontender, nondistended. Bowel sounds present. No organomegaly or mass. Foley+  PEG+ EXTREMITIES: No pedal edema, cyanosis, or clubbing.  NEUROLOGIC: No obvious facial droop noted.Marland Kitchen able to track some, opening eyes to [facility]   Not following any commands.  Alert but non verbal.   Sensation intact. Gait not checked.   Has 2+ symmetric bilateral lower extremity reflexes PSYCHIATRIC: patient is alert but not oriented, remains nonverbal SKIN: No obvious rash, lesion, or ulcer--details per RN assessment   LABORATORY PANEL:   CBC Recent Labs  Lab 11/05/18 0509  WBC 8.6  HGB 9.7*  HCT 31.3*  PLT 371   ------------------------------------------------------------------------------------------------------------------  Chemistries  Recent Labs  Lab  11/04/18 0611  NA 137  K 4.1  CL 102  CO2 24  GLUCOSE 127*  BUN 21  CREATININE 0.62  CALCIUM 8.8*  MG 1.9   ------------------------------------------------------------------------------------------------------------------  Cardiac Enzymes No results for input(s): TROPONINI in the last 168 hours. ------------------------------------------------------------------------------------------------------------------  RADIOLOGY:  No results found.  EKG:   Orders placed or performed in visit on 04/13/17  . 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  1. Acute encephalopathy-suspected secondary to hypoglycemia when pt presented to the hospital -pt required mechanical ventilation -Has bilateral encephalomalacia based on CT head.  Prior to left frontal craniotomy done.  Prior history of strokes. -Has right anterior cerebral artery aneurysm embolization in the past and clipping recently this year. -MRI cannot be done due to recent clipping. -Appreciate neurology consult.  Provigil dose has been increased without significant change.  Remains alert but not following commands. -Very slow recovery expected at this point.   -S/P PEG placement on June 5th 2020-PEG feeding going well  2.  Sepsis-secondary to urinary tract infection.  All cultures have been negative.  Off pressors.  Off steroids. --Finished broad-spectrum antibiotics  3.  Acute renal failure-secondary to acute urinary retention with history of BPH -Appreciate nephrology consult.  Bilateral hydronephrosis on renal ultrasound. - Status post Foley cath -Renal function is normalized at this time  4.  Poor oral intake/Nutrition - now s/p PEG tube placement since mental  status  recovery will not be sooner according to neurology. - Patient had a G-tube and tracheostomy which was recently removed couple of months ago and patient was eating by mouth.  -PEG feeding going well  5.  Seizure prevention -prophylactically on Keppra.  6.  DVT prophylaxis-subcutaneous heparin   - Pt was denied LTAC services per CSW. He was from [facility]. CSW working with dter for SNF d/c planning. Pt at present baseline (best new) for d/c -d/w Amiee and have asked her to talk with CSW regarding bed offers    CODE STATUS: Full code  TOTAL TIME TAKING CARE OF THIS PATIENT: 25 minutes.   POSSIBLE D/C IN ?  DAYS, DEPENDING ON CLINICAL CONDITION.   Fritzi Mandes M.D on 11/07/2018 at 7:51 AM  Between 7am to 6pm - Pager - [phone]  After 6pm go to [email or web address] - password [email or web address] [place] [facility]  Office  [phone]  CC: Primary care physician; Garwin Brothers, MD

## 2018-11-07 NOTE — Progress Notes (Signed)
New referral for outpatient Palliative to follow at [facility] received from [place]. Patient information faxed to referral. Thank you. Flo Shanks BSN, RN, [facility] Liaison [facility].

## 2018-11-07 NOTE — Progress Notes (Signed)
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 who presented from his facility unresponsive requiring him to be ventilated in the ED. Patient has been seen in previous years at behavioral health for suicide ideation.     PT Comments    Patient awake upon PT arrival, able to follow PT with eyes and head movements. Patient demonstrated improved/increased muscle activation with assistance of RLE however continues to require passive movement of LLE at this time. High tone noted bilaterally with noted inversion of L foot. Prolonged hold for attempt to obtain neutral position performed however unable to lengthen muscle tissues manually. Patient will benefit from a splint/bracing for L ankle to improve L foot/ankle alignments. Goal's continued/updated as patient continues to require focus on these transitions and current care plan remains appropriate. Current POC remains appropriate at this time.    Follow Up Recommendations  SNF;LTACH     Equipment Recommendations  None recommended by PT(facility should have needed equipment)    Recommendations for Other Services OT consult     Precautions / Restrictions Precautions Precautions: Fall Restrictions Weight Bearing Restrictions: No    Mobility  Bed Mobility Overal bed mobility: Needs Assistance             General bed mobility comments: Total A x2 for any bed mobility  Transfers                 General transfer comment: not safe at this time  Ambulation/Gait                 Stairs             Wheelchair Mobility    Modified Rankin (Stroke Patients Only)       Balance Overall balance assessment: Needs  assistance     Sitting balance - Comments: not able to assess, dependent transfers                                    Cognition Arousal/Alertness: Lethargic Behavior During Therapy: Flat affect Overall Cognitive Status: Difficult to assess Area of Impairment: Following commands                               General Comments: Did not verbalize or attempt to communicate in anyway. Unable to follow simple commands. followed PT with eyes      Exercises General Exercises - Lower Extremity Ankle Circles/Pumps: AAROM;Right;PROM;Left;10 reps;Supine Heel Slides: AAROM;Right;PROM;Left;10 reps;Supine Hip ABduction/ADduction: PROM;Both;10 reps;Supine Straight Leg Raises: AAROM;Right;PROM;Left;10 reps;Supine Other Exercises Other Exercises: cervical ROM with eye/head follow of PT in varying planes for continued cervical strengthening Other Exercises: prolonged hold for attempt to obtain neutral ankle position of LLE.    General Comments        Pertinent Vitals/Pain Pain Assessment: Faces Faces Pain Scale: No hurt    Home Living                      Prior Function            PT Goals (current goals can now be found in the care plan  section) Acute Rehab PT Goals PT Goal Formulation: Patient unable to participate in goal setting    Frequency    Min 2X/week      PT Plan Current plan remains appropriate    Co-evaluation              AM-PAC PT "6 Clicks" Mobility   Outcome Measure  Help needed turning from your back to your side while in a flat bed without using bedrails?: Total Help needed moving from lying on your back to sitting on the side of a flat bed without using bedrails?: Total Help needed moving to and from a bed to a chair (including a wheelchair)?: Total Help needed standing up from a chair using your arms (e.g., wheelchair or bedside chair)?: Total Help needed to walk in hospital room?: Total Help needed climbing  3-5 steps with a railing? : Total 6 Click Score: 6    End of Session   Activity Tolerance: Other (comment)(cognition ) Patient left: in bed;with call bell/phone within reach;with bed alarm set Nurse Communication: Mobility status PT Visit Diagnosis: Unsteadiness on feet (R26.81);Other abnormalities of gait and mobility (R26.89);Muscle weakness (generalized) (M62.81);Difficulty in walking, not elsewhere classified (R26.2);Other symptoms and signs involving the nervous system (R29.898)     Time: 6294-7654 PT Time Calculation (min) (ACUTE ONLY): 13 min  Charges:  $Therapeutic Exercise: 8-22 mins                     Janna Arch, PT, DPT    Janna Arch 11/07/2018, 1:54 PM

## 2018-11-08 LAB — GLUCOSE, CAPILLARY
Glucose-Capillary: 119 mg/dL — ABNORMAL HIGH (ref 70–99)
Glucose-Capillary: 128 mg/dL — ABNORMAL HIGH (ref 70–99)
Glucose-Capillary: 132 mg/dL — ABNORMAL HIGH (ref 70–99)
Glucose-Capillary: 133 mg/dL — ABNORMAL HIGH (ref 70–99)
Glucose-Capillary: 136 mg/dL — ABNORMAL HIGH (ref 70–99)

## 2018-11-08 LAB — CBC
HCT: 31.3 % — ABNORMAL LOW (ref 39.0–52.0)
Hemoglobin: 10 g/dL — ABNORMAL LOW (ref 13.0–17.0)
MCH: 27.7 pg (ref 26.0–34.0)
MCHC: 31.9 g/dL (ref 30.0–36.0)
MCV: 86.7 fL (ref 80.0–100.0)
Platelets: 351 10*3/uL (ref 150–400)
RBC: 3.61 MIL/uL — ABNORMAL LOW (ref 4.22–5.81)
RDW: 17.3 % — ABNORMAL HIGH (ref 11.5–15.5)
WBC: 10.3 10*3/uL (ref 4.0–10.5)
nRBC: 0 % (ref 0.0–0.2)

## 2018-11-08 LAB — HIV ANTIBODY (ROUTINE TESTING W REFLEX): HIV Screen 4th Generation wRfx: NONREACTIVE

## 2018-11-08 NOTE — NC FL2 (Signed)
[place] LEVEL OF CARE SCREENING TOOL     IDENTIFICATION  Patient Name: [name] Birthdate: 1956/04/22 Sex: male Admission Date (Current Location): 10/19/2018  [place] and [place] Number:  Engineering geologist and Address:  [facility], [address] [place], [place] [address]      Provider Number: [id number]  Attending Physician Name and Address:  Fritzi Mandes, MD  Relative Name and Phone Number:  Kouper Spinella [phone]    Current Level of Care: Hospital Recommended Level of Care: [place] Prior Approval Number:    Date Approved/Denied:   PASRR Number: [id number]  Discharge Plan: SNF    Current Diagnoses: Patient Active Problem List   Diagnosis Date Noted  . Pressure injury of skin 10/23/2018  . Sepsis ([place]) 10/19/2018  . Severe major depression ([place]) 04/14/2017  . Major depressive disorder, recurrent ([place]) 04/09/2017  . MDD (major depressive disorder), recurrent severe, without psychosis ([place]) 04/09/2017  . Family history of brain aneurysm   . Intractable vomiting 10/20/2016  . History of intracranial aneurysm   . Hypokalemia   . AKI (acute kidney injury) ([place])   . Prolonged QT interval   . Lactic acidosis   . Dysphagia   . Esophageal ring   . Gastroesophageal reflux disease with esophagitis   . Nausea and vomiting 10/01/2016  . 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 Severe episode of recurrent major depressive disorder, without psychotic features ([place]) 07/20/2016  . CVA (cerebral vascular accident) ([place]) 06/15/2016  . Abdominal pain   . Numbness   . Carpal tunnel syndrome, bilateral 12/16/2015  . Status post stroke 11/12/2015  . Transient neurologic deficit 10/15/2015  . Cerebrovascular accident, late effects 10/07/2015  . Complicated migraine   . Anxiety state   . 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 Cerebral infarction due to embolism of right middle cerebral artery ([place]) 09/23/2015  .  Cerebral infarction due to embolism of cerebral artery ([place]) 09/23/2015  . Gait disturbance, post-stroke   . Drooping of mouth   . Cerebrovascular accident (CVA) due to embolism of right anterior cerebral artery ([place])   . Benign essential HTN   . Migraine with aura and without status migrainosus, not intractable   . Tachypnea   . Prediabetes   . Acute blood loss anemia   . Brain aneurysm 09/20/2015  . Type 2 diabetes mellitus ([place]) 09/15/2015  . Leukocytosis 09/15/2015  . Aneurysm, cerebral, nonruptured 09/15/2015  . TIA (transient ischemic attack) 09/14/2015  . Temporary cerebral vascular dysfunction 09/14/2015  . H/O transient cerebral ischemia 07/29/2015  . At risk for falling 07/29/2015  . Bilateral hearing loss 10/25/2014  . Abnormal fear 08/08/2012  . Headache, migraine 08/08/2012  . Arthritis, degenerative 08/08/2012  . History of tear of ACL (anterior cruciate ligament) 03/08/2011  . History of knee problem 03/08/2011  . Depression 09/07/2010  . Depressive disorder, not elsewhere classified 09/07/2010  . URI (upper respiratory infection) 08/24/2010  . Hearing loss 09/06/2006  . Hyperlipidemia 04/23/2006  . Essential hypertension 04/23/2006  . Psychophysiological insomnia 04/23/2006    Orientation RESPIRATION BLADDER Height & Weight      self  Normal Incontinent Weight: 98.5 kg Height:  6' (182.9 cm)  BEHAVIORAL SYMPTOMS/MOOD NEUROLOGICAL BOWEL NUTRITION STATUS      Incontinent PEG- See instructions  AMBULATORY STATUS COMMUNICATION OF NEEDS Skin   Total Care Non-Verbally Other (Comment)(excorated on buttocks)  Personal Care Assistance Level of Assistance  Total care    max       Functional Limitations Info             SPECIAL CARE FACTORS FREQUENCY   Palliative consult Osmolite 1.5 calories 1017mL per tube @60  mL/hr; Prostat Sugar Free 64- 73mL BID per tube; Free H2O 150 mL every 4 hours per tube.                     Contractures Contractures Info: Not present    Additional Factors Info  Code Status, Allergies(Full code; Allergic to Metformin) Code Status Info: Full code Allergies Info: Metformin           Current Medications (11/08/2018):  This is the current hospital active medication list Current Facility-Administered Medications  Medication Dose Route Frequency Provider Last Rate Last Dose  . 0.9 %  sodium chloride infusion   Intravenous PRN Gladstone Lighter, MD 0 mL/hr at 10/30/18 1116    . acetaminophen (TYLENOL) suppository 650 mg  650 mg Rectal Q6H PRN Lance Coon, MD   650 mg at 10/29/18 2335  . atorvastatin (LIPITOR) tablet 40 mg  40 mg Per Tube QPM Fritzi Mandes, MD   40 mg at 11/07/18 1711  . carvedilol (COREG) tablet 25 mg  25 mg Per Tube BID WC Fritzi Mandes, MD   25 mg at 11/07/18 1711  . chlorhexidine (PERIDEX) 0.12 % solution 15 mL  15 mL Mouth Rinse BID Ottie Glazier, MD   15 mL at 11/07/18 2300  . Chlorhexidine Gluconate Cloth 2 % PADS 6 each  6 each Topical Q0600 Gladstone Lighter, MD   6 each at 11/07/18 [phone]  . doxazosin (CARDURA) tablet 2 mg  2 mg Per Tube QHS Fritzi Mandes, MD   2 mg at 11/07/18 2300  . famotidine (PEPCID) 40 MG/5ML suspension 20 mg  20 mg Per Tube BID Hallaji, Sheema M, RPH   20 mg at 11/07/18 2300  . feeding supplement (OSMOLITE 1.5 CAL) liquid 1,000 mL  1,000 mL Per Tube Continuous Fritzi Mandes, MD 60 mL/hr at 11/06/18 0756 1,000 mL at 11/06/18 0756  . feeding supplement (PRO-STAT SUGAR FREE 64) liquid 30 mL  30 mL Per Tube BID Fritzi Mandes, MD   30 mL at 11/07/18 2300  . free water 150 mL  150 mL Per Tube Q4H Gladstone Lighter, MD   150 mL at 11/08/18 0400  . heparin injection 5,000 Units  5,000 Units Subcutaneous Q8H Fritzi Mandes, MD   5,000 Units at 11/08/18 0629  . levETIRAcetam (KEPPRA) 100 MG/ML solution 500 mg  500 mg Per Tube BID Charlett Nose, RPH   500 mg at 11/07/18 2300  . MEDLINE mouth rinse  15 mL Mouth Rinse q12n4p Aleskerov, Fuad, MD    15 mL at 11/07/18 1710  . modafinil (PROVIGIL) tablet 200 mg  200 mg Per Tube BID Gladstone Lighter, MD   200 mg at 11/07/18 1711  . nutrition supplement (JUVEN) (JUVEN) powder packet 1 packet  1 packet Per Tube BID BM Ottie Glazier, MD   1 packet at 11/07/18 1711  . polyethylene glycol (MIRALAX / GLYCOLAX) packet 17 g  17 g Oral Daily PRN Fritzi Mandes, MD         Discharge Medications: Please see discharge summary for a list of discharge medications.  Relevant Imaging Results:  Relevant Lab Results:   Additional Information SS# [id number]  Marshell Garfinkel, RN

## 2018-11-08 NOTE — Progress Notes (Signed)
[place] at [place] NAME: [name]    MR#:  [id number]  DATE OF BIRTH:  Apr 28, 1956  SUBJECTIVE:   -pt opens eyes on VC. Tracks with eyes in the room. Non verbal -s/p PEG tube placement No new issues per RN  PT and OT worked with him REVIEW OF SYSTEMS:  Review of Systems  Unable to perform ROS: Mental status change    DRUG ALLERGIES:   Allergies  Allergen Reactions  . Metformin Diarrhea    VITALS:  Blood pressure 111/68, pulse 89, temperature 98.4 F (36.9 C), temperature source Oral, resp. rate 16, height 6' (1.829 m), weight 98.5 kg, SpO2 98 %.  PHYSICAL EXAMINATION:  Physical Exam   GENERAL:  63 y.o.-year-old chronically ill-appearing patient lying in the bed with no acute distress. EYES: Pupils equal, round, reactive to light and accommodation. No scleral icterus.  HEENT: Head atraumatic, normocephalic. Oropharynx and nasopharynx clear.  NECK:  Supple, no jugular venous distention. No thyroid enlargement, no tenderness.  LUNGS: Normal breath sounds bilaterally, no wheezing, rales,rhonchi or crepitation. No use of accessory muscles of respiration.  CARDIOVASCULAR: S1, S2 normal. No murmurs, rubs, or gallops.  ABDOMEN: Soft, nontender, nondistended. Bowel sounds present. No organomegaly or mass. Foley+  PEG+ EXTREMITIES: No pedal edema, cyanosis, or clubbing.  NEUROLOGIC: No obvious facial droop noted.Marland Kitchen able to track some, opening eyes to [facility]   Not following any commands.  Alert but non verbal.   Sensation intact. Gait not checked.   Has 2+ symmetric bilateral lower extremity reflexes PSYCHIATRIC: patient is alert but not oriented, remains nonverbal SKIN: No obvious rash, lesion, or ulcer--details per RN assessment   LABORATORY PANEL:   CBC Recent Labs  Lab 11/08/18 0458  WBC 10.3  HGB 10.0*  HCT 31.3*  PLT 351    ------------------------------------------------------------------------------------------------------------------  Chemistries  Recent Labs  Lab 11/04/18 0611  NA 137  K 4.1  CL 102  CO2 24  GLUCOSE 127*  BUN 21  CREATININE 0.62  CALCIUM 8.8*  MG 1.9   ------------------------------------------------------------------------------------------------------------------  Cardiac Enzymes No results for input(s): TROPONINI in the last 168 hours. ------------------------------------------------------------------------------------------------------------------  RADIOLOGY:  No results found.  EKG:   Orders placed or performed in visit on 04/13/17  . 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  1. Acute encephalopathy-suspected secondary to hypoglycemia when pt presented to the hospital -pt required mechanical ventilation -Has bilateral encephalomalacia based on CT head.  Prior to left frontal craniotomy done.  Prior history of strokes. -Has right anterior cerebral artery aneurysm embolization in the past and clipping recently this year. -MRI cannot be done due to recent clipping. -Appreciate neurology consult.  Provigil dose has been increased without significant change.  Remains alert but not following commands. -Very slow recovery expected at this point.   -S/P PEG placement on June 5th 2020-PEG feeding going well  2. Sepsis-secondary to urinary tract infection.  All cultures have been negative.  Off pressors.  Off steroids. --Finished broad-spectrum antibiotics  3.  Acute renal failure-secondary to acute urinary retention with history of BPH -Appreciate nephrology consult.  Bilateral hydronephrosis on renal  ultrasound. - Status post Foley cath -Renal function is normalized at this time  4. Poor oral intake/Nutrition - now  s/p PEG tube placement since mental status recovery will not be sooner according to neurology. - Patient had a G-tube and tracheostomy which was recently removed couple of months ago and patient was eating by mouth.  -PEG feeding going well  5.  Seizure prevention -prophylactically on Keppra.  6.  DVT prophylaxis-subcutaneous heparin   - Pt was denied LTAC services per CSW. He was from [facility]. CSW working with dter for SNF d/c planning. Pt at present baseline (best new) for d/c -I had a peer to peer review with Dr. Aris Georgia with patient's insurance company. To him reviewing at physical therapy notes prior to patient being admitted here he was not performing very well. Patient was wheelchair-bound as well. And given this hospitalization and current mental status he does not foresee patient improving with physical therapy. Patient is at goal rate for his peg feeding. He recommends long-term care. Unfortunately patient does not have long-term care plan under his insurance. Discussed with social worker who will inform patient's family.   CODE STATUS: Full code  TOTAL TIME TAKING CARE OF THIS PATIENT: 25 minutes.   POSSIBLE D/C IN ?  DAYS, DEPENDING ON CLINICAL CONDITION.   Fritzi Mandes M.D on 11/08/2018 at 12:29 PM  Between 7am to 6pm - Pager - [phone]  After 6pm go to [email or web address] - password [email or web address] [facility]  Office  [phone]  CC: Primary care physician; Garwin Brothers, MD

## 2018-11-08 NOTE — TOC Progression Note (Signed)
Transition of Care [facility]) - Progression Note    Patient Details  Name: [name] MRN: [id number] Date of Birth: 1955/12/28  Transition of Care [facility]) CM/SW Contact  Marshell Garfinkel, RN Phone Number: 11/08/2018, 2:28 PM  Clinical Narrative:     RNCM received notification that [facility] has denied SNF placement after peer to peer review. RNCM spoke with daughter [name] by phone.  She expresses concern as she states that she and her sister will not be able to safely care for patient. She works as a Audiological scientist and her sister lives in an apartment studio with a child that has special needs.  [name] asked that I contact [place] to arrange private pay option until Medicaid is approved for long-term care. She still feels that patient will improve. RNCM advised her to contact [facility] regarding her concerns. RNCM spoke with Lattie Haw at [facility] and they may be able to work something out with [facility] for this patient.    Expected Discharge Plan: Skilled Nursing Facility Barriers to Discharge: Barriers Unresolved (comment)(pending [employer] authorization for SNF)  Expected Discharge Plan and Services Expected Discharge Plan: [place]                                               Social Determinants of Health (SDOH) Interventions    Readmission Risk Interventions Readmission Risk Prevention Plan 11/08/2018  Post Dischage Appt Complete  Medication Screening Complete  Transportation Screening Complete  Some recent data might be hidden

## 2018-11-08 NOTE — Progress Notes (Signed)
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 who presented from his facility unresponsive requiring him to be ventilated in the ED. Patient has been seen in previous years at behavioral health for suicide ideation.    OT comments  [name] was seen for OT treatment on this date. Upon arrival to room pt was supine in bed with eyes open, however did not verbalize or appear to register this therapist in the room. Pt. tolerated PROM for BUEs to decrease risk of contractures, joint stiffness, and to promote good skin integrity. Pt. presents with continued flexor tightness in the LUE and bilat MCPs on this date. Pt. was lethargic, however did follow this therapist with his eyes t/o session. Pt appeared to grimace/pull away during LUE PROM (MCP extension) this appeared to be a pain response and did not re-occur during session, however this therapist limited PROM of L MCPs/wrist for pt comfort. OT assisted pt with face washing using damp cloth on this date. Pt initially grimaced at contact with warm washcloth, but after a short time began to lean head toward cloth during face washing. Pt left with room lights on and room TV on to music channel for increased stimulation and promotion of appropriate sleep/wake cycles during hospitalization. Pt.continues tobenefit from OT services for ADL training,UE there. Ex., positioning,and [email or web address] Will continue to follow POC as written. Frequency and DC recommendation remain appropriate.   Follow Up Recommendations  SNF;LTACH    Equipment Recommendations       Recommendations for Other Services      Precautions / Restrictions Precautions Precautions:  Fall Restrictions Weight Bearing Restrictions: No       Mobility Bed Mobility Overal bed mobility: Needs Assistance             General bed mobility comments: Total A x2 for any bed mobility  Transfers                 General transfer comment: not safe at this time    Balance Overall balance assessment: Needs assistance     Sitting balance - Comments: not able to assess, dependent transfers                                   ADL either performed or assessed with clinical judgement   ADL Overall ADL's : Needs assistance/impaired                                       General ADL Comments: Continues to require total assist all ADLs, Pt. is NPO     Vision Baseline Vision/History: (unable to assess 2/2 pt cognitive status.) Vision Assessment?: Vision impaired- to be further tested in functional context Additional Comments: Pt more able to visually track therapist/individuals in room.   Perception     Praxis      Cognition Arousal/Alertness: Lethargic   Overall Cognitive Status: Difficult to assess Area of Impairment: Following commands  General Comments: Did not verbalize or attempt to communicate in anyway. Unable to follow simple commands. followed this therapist with eyes.        Exercises General Exercises - Upper Extremity Shoulder Flexion: PROM;Both;10 reps Elbow Flexion: PROM;Both;10 reps Elbow Extension: Both;PROM;10 reps Wrist Flexion: PROM;Both;5 reps Wrist Extension: PROM;Both;5 reps Composite Extension: PROM;Both;5 reps Hand Exercises Forearm Supination: PROM;Both;5 reps Forearm Pronation: PROM;Both;5 reps Other Exercises Other Exercises: BUE PROM to minimize risk of joint stiffness and contracture   Shoulder Instructions       General Comments PEG tube, mitts on hands at start and end of session.    Pertinent Vitals/ Pain       Pain Location: Pt unable to  state. Monitored for discomfort t/o session.  Pain Intervention(s): Limited activity within patient's tolerance;Monitored during session;Repositioned  Home Living                                          Prior Functioning/Environment              Frequency  Min 1X/week        Progress Toward Goals  OT Goals(current goals can now be found in the care plan section)  Progress towards OT goals: Progressing toward goals  Acute Rehab OT Goals OT Goal Formulation: Patient unable to participate in goal setting  Plan Discharge plan remains appropriate;Frequency remains appropriate    Co-evaluation                 AM-PAC OT "6 Clicks" Daily Activity     Outcome Measure   Help from another person eating meals?: Total Help from another person taking care of personal grooming?: Total Help from another person toileting, which includes using toliet, bedpan, or urinal?: Total Help from another person bathing (including washing, rinsing, drying)?: Total Help from another person to put on and taking off regular upper body clothing?: Total Help from another person to put on and taking off regular lower body clothing?: Total 6 Click Score: 6    End of Session    OT Visit Diagnosis: Other abnormalities of gait and mobility (R26.89);Cognitive communication deficit (R41.841);Muscle weakness (generalized) (M62.81) Symptoms and signs involving cognitive functions: Other cerebrovascular disease   Activity Tolerance Patient limited by lethargy   Patient Left in bed;with call bell/phone within reach;with bed alarm set   Nurse Communication          Time: 3007-6226 OT Time Calculation (min): 18 min  Charges: OT General Charges $OT Visit: 1 Visit OT Treatments $Therapeutic Exercise: 8-22 mins  Shara Blazing, M.S., OTR/L Ascom: [phone] 11/08/18, 12:13 PM

## 2018-11-08 NOTE — TOC Progression Note (Addendum)
Transition of Care [facility]) - Progression Note    Patient Details  Name: [name] MRN: [id number] Date of Birth: [birth year]  Transition of Care [facility]) CM/SW Contact  Marshell Garfinkel, RN Phone Number: 11/08/2018, 8:32 AM  Clinical Narrative:    RNCM spoke with [facility] with [employer] [phone] regarding SNF authorization. She states authorization was started 11/07/18 and is currently under medical review. She states she will call RNCM with results when available. RNCM updated daughter [name] [phone]. C-19 screen negative from 11/07/18. Update at 1205P: [employer] requesting peer to peer [phone] option #5. MD notified by CSW.  Expected Discharge Plan: Skilled Nursing Facility Barriers to Discharge: Insurance Authorization  Expected Discharge Plan and Services Expected Discharge Plan: [place]                                               Social Determinants of Health (SDOH) Interventions    Readmission Risk Interventions No flowsheet data found.

## 2018-11-08 NOTE — Care Management Important Message (Signed)
Important Message  Patient Details  Name: [name] MRN: [id number] Date of Birth: [birth year]   Medicare Important Message Given:  Yes    Marshell Garfinkel, RN 11/08/2018, 10:43 AM

## 2018-11-09 LAB — GLUCOSE, CAPILLARY
Glucose-Capillary: 103 mg/dL — ABNORMAL HIGH (ref 70–99)
Glucose-Capillary: 129 mg/dL — ABNORMAL HIGH (ref 70–99)
Glucose-Capillary: 132 mg/dL — ABNORMAL HIGH (ref 70–99)
Glucose-Capillary: 139 mg/dL — ABNORMAL HIGH (ref 70–99)
Glucose-Capillary: 141 mg/dL — ABNORMAL HIGH (ref 70–99)
Glucose-Capillary: 144 mg/dL — ABNORMAL HIGH (ref 70–99)
Glucose-Capillary: 168 mg/dL — ABNORMAL HIGH (ref 70–99)

## 2018-11-09 NOTE — Progress Notes (Signed)
[place] [facility] [place] NAME: [name]    MR#:  [id number]  DATE OF BIRTH:  Apr 12, 1956  SUBJECTIVE:   -pt opens eyes on verbal commands. Tracks with eyes in the room. Non verbal -s/p PEG tube placement No new issues over night  PT and OT on board REVIEW OF SYSTEMS:  Review of Systems  Unable to perform ROS: Mental status change    DRUG ALLERGIES:   Allergies  Allergen Reactions  . Metformin Diarrhea    VITALS:  Blood pressure 118/70, pulse 91, temperature 98.9 F (37.2 C), temperature source Oral, resp. rate 16, height 6' (1.829 m), weight 99.9 kg, SpO2 96 %.  PHYSICAL EXAMINATION:  Physical Exam   GENERAL:  63 y.o.-year-old chronically ill-appearing patient lying in the bed with no acute distress. EYES: Pupils equal, round, reactive to light and accommodation. No scleral icterus.  HEENT: Head atraumatic, normocephalic. Oropharynx and nasopharynx clear.  NECK:  Supple, no jugular venous distention. No thyroid enlargement, no tenderness.  LUNGS: Normal breath sounds bilaterally, no wheezing, rales,rhonchi or crepitation. No use of accessory muscles of respiration.  CARDIOVASCULAR: S1, S2 normal. No murmurs, rubs, or gallops.  ABDOMEN: Soft, nontender, nondistended. Bowel sounds present. No organomegaly or mass. Foley+  PEG+ EXTREMITIES: No pedal edema, cyanosis, or clubbing.  NEUROLOGIC: No obvious facial droop noted.Marland Kitchen able to track some, opening eyes to [facility]   Not following any commands.  Alert but non verbal.   Sensation intact. Gait not checked.   Has 2+ symmetric bilateral lower extremity reflexes PSYCHIATRIC: patient is alert but not oriented, remains nonverbal SKIN: No obvious rash, lesion, or ulcer--details per RN assessment   LABORATORY PANEL:   CBC Recent Labs  Lab 11/08/18 0458  WBC 10.3  HGB 10.0*  HCT 31.3*  PLT 351    ------------------------------------------------------------------------------------------------------------------  Chemistries  Recent Labs  Lab 11/04/18 0611  NA 137  K 4.1  CL 102  CO2 24  GLUCOSE 127*  BUN 21  CREATININE 0.62  CALCIUM 8.8*  MG 1.9   ------------------------------------------------------------------------------------------------------------------  Cardiac Enzymes No results for input(s): TROPONINI in the last 168 hours. ------------------------------------------------------------------------------------------------------------------  RADIOLOGY:  No results found.  EKG:   Orders placed or performed in visit on 04/13/17  . 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  1. Acute encephalopathy-suspected secondary to hypoglycemia when pt presented to the hospital -pt required mechanical ventilation -Has bilateral encephalomalacia based on CT head.  Prior to left frontal craniotomy done.  Prior history of strokes. -Has right anterior cerebral artery aneurysm embolization in the past and clipping recently this year. -MRI cannot be done due to recent clipping. -Appreciate neurology consult.  Provigil dose has been increased without significant change.  Remains alert but not following commands. -Very slow recovery expected at this point.   -S/P PEG placement on June 5th 2020-PEG feeding going well  2. Sepsis-secondary to urinary tract infection.  All cultures have been negative.  Off pressors.  Off steroids. --Finished broad-spectrum antibiotics  3.  Acute renal failure-secondary to acute urinary retention with history of BPH -Appreciate nephrology consult.  Bilateral hydronephrosis on renal  ultrasound. - Status post Foley cath -Renal function is normalized at this time  4. Poor oral intake/Nutrition - now  s/p PEG tube placement since mental status recovery will not be sooner according to neurology. - Patient had a G-tube and tracheostomy which was recently removed couple of months ago and patient was eating by mouth.  -PEG feeding going well  5.  Seizure prevention -prophylactically on Keppra.  6.  DVT prophylaxis-subcutaneous heparin  - Pt was denied LTAC services per CSW. He was from [facility]. CSW working with dter for SNF d/c planning. Pt at present baseline (best new) for d/c -Dr Posey Pronto had peer to peer review with Dr. Aris Georgia with patient's insurance company. To him reviewing at physical therapy notes prior to patient being admitted here he was not performing very well. Patient was wheelchair-bound as well. And given this hospitalization and current mental status he does not foresee patient improving with physical therapy. Patient is at goal rate for his peg feeding. He recommends long-term care. Unfortunately patient does not have long-term care plan under his insurance.  Follw up with Education officer, museum and family   CODE STATUS: Full code  TOTAL TIME TAKING CARE OF THIS PATIENT: 26 minutes.   POSSIBLE D/C IN ?  DAYS, DEPENDING ON CLINICAL CONDITION.   Saundra Shelling M.D on 11/09/2018 at 11:18 AM  Between 7am to 6pm - Pager - [phone]  After 6pm go to [email or web address] - password [email or web address] [facility]  Office  [phone]  CC: Primary care physician; Garwin Brothers, MD

## 2018-11-09 NOTE — Progress Notes (Signed)
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 who presented from his facility unresponsive requiring him to be ventilated in the ED. Patient has been seen in previous years at behavioral health for suicide ideation.    OT comments  Pt seen for OT tx this date. Pt sleeping upon entry, opens eyes partially to light sternal rub and verbal cues. BUE PROM performed to minimize risk of joint stiffness and contracture - LUE noted to be more flexed and stiff, was able to increase wrist and composite finger extension with passive prolonged stretch, pt unable to sustain muscle activity. Pt tracked and maintained eye contact with therapist approx 50% of session. Pt did not verbalize or attempt to verbalize during session. Grimaced slightly with LUE PROM. Max A to wash face and comb hair with pt able to initiate muscle activity to raise RUE as palpated by therapist. Therapist is looking into resting hand splints for pt to prevent flexion contractures and maximize functional positioning of B wrists and hands for optimal functional use. Pt demonstrating progress since last session with this therapist. Continues to benefit from skilled OT services to maximize return to PLOF, minimize functional decline, and caregiver burden. Strongly recommend STR vs LTAC for continued therapy upon discharge.    Follow Up Recommendations  SNF;LTACH    Equipment Recommendations  None recommended by OT    Recommendations for Other Services      Precautions / Restrictions Precautions Precautions: Fall Restrictions Weight Bearing Restrictions: No       Mobility Bed Mobility                  Transfers                       Balance                                           ADL either performed or assessed with clinical judgement   ADL Overall ADL's : Needs assistance/impaired     Grooming: Wash/dry face;Set up;Maximal assistance;Cueing for sequencing;Bed level;Brushing hair Grooming Details (indicate cue type and reason): with therapist initiating and provided simple verbal and tactile cues, pt able to initiate muscle activation required to bring wash cloth to face but unable to sustain, after a small amount of repetition, pt turned head slightly side to side to reach and wipe the washcloth across his mouth (still with max assist to hold) and to comb ~25% of hair                               General ADL Comments: Continues to require total assist all LB ADLs, max assist for grooming, Pt. is NPO     Vision Patient Visual Report: Other (comment)(Unable to assess 2/2 pt's cognitive status) Additional Comments: Pt able to track therapist in room approx 50% of session, would not look when requested   Perception     Praxis      Cognition Arousal/Alertness: Lethargic Behavior During Therapy: Flat affect Overall Cognitive Status: Difficult to assess  General Comments: Did not verbalize or attempt to communicate in anyway. Unable to follow simple commands. followed this therapist with eyes approx 50% of the time, at 3 points during it appeared as though pt was able to initiate muscle activation to do as therapist requested, but unable to repeat it        Exercises Other Exercises Other Exercises: BUE PROM to minimize risk of joint stiffness and contracture - LUE noted to be more flexed and stiff, was able to increase wrist and composite finger extension with passive prolonged stretch, pt unable to maintain   Shoulder Instructions       General Comments      Pertinent Vitals/ Pain       Pain Assessment:  Faces Faces Pain Scale: Hurts little more Pain Location: pt grimaced slightly with L shoulder flexion PROM and with slightly increased L wrist and composite finger extension Pain Descriptors / Indicators: Grimacing Pain Intervention(s): Limited activity within patient's tolerance;Monitored during session;Repositioned  Home Living                                          Prior Functioning/Environment              Frequency  Min 1X/week        Progress Toward Goals  OT Goals(current goals can now be found in the care plan section)     Acute Rehab OT Goals OT Goal Formulation: Patient unable to participate in goal setting  Plan Discharge plan remains appropriate;Frequency remains appropriate    Co-evaluation                 AM-PAC OT "6 Clicks" Daily Activity     Outcome Measure   Help from another person eating meals?: Total Help from another person taking care of personal grooming?: A Lot Help from another person toileting, which includes using toliet, bedpan, or urinal?: Total Help from another person bathing (including washing, rinsing, drying)?: Total Help from another person to put on and taking off regular upper body clothing?: Total Help from another person to put on and taking off regular lower body clothing?: Total 6 Click Score: 7    End of Session    OT Visit Diagnosis: Other abnormalities of gait and mobility (R26.89);Cognitive communication deficit (R41.841);Muscle weakness (generalized) (M62.81) Symptoms and signs involving cognitive functions: Other cerebrovascular disease   Activity Tolerance Patient tolerated treatment well   Patient Left in bed;with call bell/phone within reach;with bed alarm set   Nurse Communication          Time: 1749-4496 OT Time Calculation (min): 24 min  Charges: OT General Charges $OT Visit: 1 Visit OT Treatments $Self Care/Home Management : 8-22 mins $Therapeutic Exercise: 8-22  mins  Jeni Salles, MPH, MS, OTR/L ascom [phone] 11/09/18, 3:32 PM

## 2018-11-10 LAB — GLUCOSE, CAPILLARY
Glucose-Capillary: 138 mg/dL — ABNORMAL HIGH (ref 70–99)
Glucose-Capillary: 135 mg/dL — ABNORMAL HIGH (ref 70–99)
Glucose-Capillary: 142 mg/dL — ABNORMAL HIGH (ref 70–99)
Glucose-Capillary: 140 mg/dL — ABNORMAL HIGH (ref 70–99)
Glucose-Capillary: 126 mg/dL — ABNORMAL HIGH (ref 70–99)

## 2018-11-10 MED ORDER — SODIUM CHLORIDE 0.9% FLUSH: 3.0000 mL | INTRAVENOUS | Status: DC | PRN

## 2018-11-10 MED ORDER — ORAL CARE MOUTH RINSE
15.0000 mL | Freq: Two times a day (BID) | OROMUCOSAL | Status: DC
  Administered 2018-11-10 – 2018-11-28 (×37): 15 mL via OROMUCOSAL

## 2018-11-10 MED ORDER — CHLORHEXIDINE GLUCONATE 0.12 % MT SOLN: 15.0000 mL | Freq: Two times a day (BID) | OROMUCOSAL | Status: DC

## 2018-11-10 MED ORDER — SODIUM CHLORIDE 0.9% FLUSH
3.0000 mL | Freq: Two times a day (BID) | INTRAVENOUS | Status: DC
  Administered 2018-11-10 – 2018-11-18 (×11): 3 mL via INTRAVENOUS

## 2018-11-10 NOTE — Progress Notes (Signed)
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 who presented from his facility unresponsive requiring him to be ventilated in the ED. Patient has been seen in previous years at behavioral health for suicide ideation.     PT Comments    General PROM for UE/LE ROM in supine.  Pt seemed generally comfortable with activity but did grimace some at end ranges.  No participation in activity.  Repositioned for comfort.  Follow Up Recommendations  SNF;LTACH     Equipment Recommendations  None recommended by PT    Recommendations for Other Services       Precautions / Restrictions Precautions Precautions: Fall Restrictions Weight Bearing Restrictions: No    Mobility  Bed Mobility               General bed mobility comments: deferred  Transfers                 General transfer comment: not safe at this time  Ambulation/Gait                 Stairs             Wheelchair Mobility    Modified Rankin (Stroke Patients Only)       Balance                                            Cognition Arousal/Alertness: Lethargic Behavior During Therapy: Flat affect Overall Cognitive Status: Difficult to assess Area of Impairment: Following commands                                      Exercises Other Exercises Other Exercises: BLE and UE PROM for available ranges in supine.    General Comments        Pertinent Vitals/Pain Pain Assessment: Faces Faces Pain Scale: Hurts a little bit Pain Location: some grimmaces at end ranges but generally tolerates well. Pain Descriptors / Indicators: Grimacing Pain Intervention(s): Limited  activity within patient's tolerance;Monitored during session    Home Living                      Prior Function            PT Goals (current goals can now be found in the care plan section) Progress towards PT goals: Progressing toward goals    Frequency    Min 2X/week      PT Plan Current plan remains appropriate    Co-evaluation              AM-PAC PT "6 Clicks" Mobility   Outcome Measure  Help needed turning from your back to your side while in a flat bed without using bedrails?: Total Help needed moving from lying on your back to sitting on the side of a flat bed without using bedrails?: Total Help needed moving to and from a bed to a chair (including a wheelchair)?: Total Help needed standing up from a chair using your arms (e.g., wheelchair or bedside chair)?: Total  Help needed to walk in hospital room?: Total Help needed climbing 3-5 steps with a railing? : Total 6 Click Score: 6    End of Session   Activity Tolerance: Patient tolerated treatment well Patient left: in bed;with call bell/phone within reach;with bed alarm set         Time: 7543-6067 PT Time Calculation (min) (ACUTE ONLY): 8 min  Charges:  $Therapeutic Exercise: 8-22 mins                     Chesley Noon, PTA 11/10/18, 9:06 AM

## 2018-11-10 NOTE — Progress Notes (Signed)
Nonverbal, tracks with eyes. Tolerating PEG tube feedings and medications. Turned and repositioned every 2 hours with pillow wedges placed. No issues. Foley patent w cloudy yellow urine.

## 2018-11-10 NOTE — Progress Notes (Addendum)
[place] at [place] NAME: [name]    MR#:  [id number]  DATE OF BIRTH:  July 13, 1955  SUBJECTIVE:   -pt opens eyes on verbal commands. Tracks with eyes in the room. Non verbal -s/p PEG tube placement No new issues over night  s/p PT and OT evaluation REVIEW OF SYSTEMS:  Review of Systems  Unable to perform ROS: Mental status change    DRUG ALLERGIES:   Allergies  Allergen Reactions  . Metformin Diarrhea    VITALS:  Blood pressure 129/72, pulse 85, temperature 98.6 F (37 C), temperature source Oral, resp. rate 20, height 6' (1.829 m), weight 100 kg, SpO2 99 %.  PHYSICAL EXAMINATION:  Physical Exam   GENERAL:  63 y.o.-year-old chronically ill-appearing patient lying in the bed with no acute distress. EYES: Pupils equal, round, reactive to light and accommodation. No scleral icterus.  HEENT: Head atraumatic, normocephalic. Oropharynx and nasopharynx clear.  NECK:  Supple, no jugular venous distention. No thyroid enlargement, no tenderness.  LUNGS: Normal breath sounds bilaterally, no wheezing, rales,rhonchi or crepitation. No use of accessory muscles of respiration.  CARDIOVASCULAR: S1, S2 normal. No murmurs, rubs, or gallops.  ABDOMEN: Soft, nontender, nondistended. Bowel sounds present. No organomegaly or mass. Foley+  PEG+ EXTREMITIES: No pedal edema, cyanosis, or clubbing.  NEUROLOGIC: No obvious facial droop noted.Marland Kitchen able to track some, opening eyes to [facility]   Not following any commands.  Alert but non verbal.   Sensation intact. Gait not checked.   Has 2+ symmetric bilateral lower extremity reflexes PSYCHIATRIC: patient is alert but not oriented, remains nonverbal SKIN: No obvious rash, lesion, or ulcer--details per RN assessment   LABORATORY PANEL:   CBC Recent Labs  Lab 11/08/18 0458  WBC 10.3  HGB 10.0*  HCT 31.3*  PLT 351    ------------------------------------------------------------------------------------------------------------------  Chemistries  Recent Labs  Lab 11/04/18 0611  NA 137  K 4.1  CL 102  CO2 24  GLUCOSE 127*  BUN 21  CREATININE 0.62  CALCIUM 8.8*  MG 1.9   ------------------------------------------------------------------------------------------------------------------  Cardiac Enzymes No results for input(s): TROPONINI in the last 168 hours. ------------------------------------------------------------------------------------------------------------------  RADIOLOGY:  No results found.  EKG:   Orders placed or performed in visit on 04/13/17  . 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  1. Acute encephalopathy-suspected secondary to hypoglycemia when pt presented to the hospital -pt required mechanical ventilation -Has bilateral encephalomalacia based on CT head.  Prior to left frontal craniotomy done.  Prior history of strokes. -Has right anterior cerebral artery aneurysm embolization in the past and clipping recently this year. -MRI cannot be done due to recent clipping. -Appreciate neurology consult.  Provigil dose has been increased without significant change.  Remains alert but not following commands. -Very slow recovery expected at this point.   -S/P PEG placement on June 5th 2020-PEG feeding going well  2. Sepsis-secondary to urinary tract infection.  All cultures have been negative.  Off pressors.  Off steroids. --Finished broad-spectrum antibiotics  3.  Acute renal failure-secondary to acute urinary retention with history of BPH -Appreciate nephrology consult.  Bilateral hydronephrosis on renal  ultrasound. - Status post Foley cath -Renal function is normalized at this time  4. Poor oral intake/Nutrition - now  s/p PEG tube placement since mental status recovery will not be sooner according to neurology. - Patient had a G-tube and tracheostomy which was recently removed couple of months ago and patient was eating by mouth.  -PEG feeding going well  5.  Seizure prevention -prophylactically on Keppra.  6.  DVT prophylaxis-subcutaneous heparin  - Pt was denied LTAC services per CSW. He was from [facility]. CSW working with dter for SNF d/c planning. Pt at present baseline (best new) for d/c -Dr Posey Pronto had peer to peer review with Dr. Aris Georgia with patient's insurance company. To him reviewing at physical therapy notes prior to patient being admitted here he was not performing very well. Patient was wheelchair-bound as well. And given this hospitalization and current mental status he does not foresee patient improving with physical therapy. Patient is at goal rate for his peg feeding. He recommends long-term care. Unfortunately patient does not have long-term care plan under his insurance.  Follw up with Education officer, museum and family PT & OT recommend SNF/LTACH Social worker f/u Called family and left voice mail.  CODE STATUS: Full code  TOTAL TIME TAKING CARE OF THIS PATIENT: 25 minutes.   POSSIBLE D/C IN ?  DAYS, DEPENDING ON CLINICAL CONDITION.   Saundra Shelling M.D on 11/10/2018 at 10:56 AM  Between 7am to 6pm - Pager - [phone]  After 6pm go to [email or web address] - password [email or web address] [place] [facility]  Office  [phone]  CC: Primary care physician; Garwin Brothers, MD

## 2018-11-11 LAB — GLUCOSE, CAPILLARY
Glucose-Capillary: 128 mg/dL — ABNORMAL HIGH (ref 70–99)
Glucose-Capillary: 94 mg/dL (ref 70–99)
Glucose-Capillary: 140 mg/dL — ABNORMAL HIGH (ref 70–99)

## 2018-11-11 MED ORDER — ENOXAPARIN SODIUM 40 MG/0.4ML ~~LOC~~ SOLN
40.0000 mg | SUBCUTANEOUS | Status: DC
  Administered 2018-11-11 – 2018-11-28 (×18): 40 mg via SUBCUTANEOUS
  Filled 2018-11-11 (×18): qty 0.4

## 2018-11-11 NOTE — Progress Notes (Signed)
[place] at [place] NAME: [name]    MR#:  [id number]  DATE OF BIRTH:  1955/10/28  SUBJECTIVE:   -pt opens eyes on verbal commands. Tracks with eyes in the room. Non verbal -s/p PEG tube placement No new issues over night  s/p PT and OT evaluation Awake, non verbal REVIEW OF SYSTEMS:  Review of Systems  Unable to perform ROS: Mental status change    DRUG ALLERGIES:   Allergies  Allergen Reactions  . Metformin Diarrhea    VITALS:  Blood pressure 124/64, pulse 86, temperature 98.5 F (36.9 C), resp. rate 18, height 6' (1.829 m), weight 100.7 kg, SpO2 95 %.  PHYSICAL EXAMINATION:  Physical Exam   GENERAL:  63 y.o.-year-old chronically ill-appearing patient lying in the bed with no acute distress. EYES: Pupils equal, round, reactive to light and accommodation. No scleral icterus.  HEENT: Head atraumatic, normocephalic. Oropharynx and nasopharynx clear.  NECK:  Supple, no jugular venous distention. No thyroid enlargement, no tenderness.  LUNGS: Normal breath sounds bilaterally, no wheezing, rales,rhonchi or crepitation. No use of accessory muscles of respiration.  CARDIOVASCULAR: S1, S2 normal. No murmurs, rubs, or gallops.  ABDOMEN: Soft, nontender, nondistended. Bowel sounds present. No organomegaly or mass. Foley+  PEG+ EXTREMITIES: No pedal edema, cyanosis, or clubbing.  NEUROLOGIC: No obvious facial droop noted.Marland Kitchen able to track some, opening eyes to [facility]   Not following any commands.  Alert but non verbal.   Sensation intact. Gait not checked.   Has 2+ symmetric bilateral lower extremity reflexes PSYCHIATRIC: patient is alert but not oriented, remains nonverbal SKIN: No obvious rash, lesion, or ulcer--details per RN assessment   LABORATORY PANEL:   CBC Recent Labs  Lab 11/08/18 0458  WBC 10.3  HGB 10.0*  HCT 31.3*  PLT 351    ------------------------------------------------------------------------------------------------------------------  Chemistries  No results for input(s): NA, K, CL, CO2, GLUCOSE, BUN, CREATININE, CALCIUM, MG, AST, ALT, ALKPHOS, BILITOT in the last 168 hours.  Invalid input(s): GFRCGP ------------------------------------------------------------------------------------------------------------------  Cardiac Enzymes No results for input(s): TROPONINI in the last 168 hours. ------------------------------------------------------------------------------------------------------------------  RADIOLOGY:  No results found.  EKG:   Orders placed or performed in visit on 04/13/17  . 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  1. Acute encephalopathy-suspected secondary to hypoglycemia when pt presented to the hospital -pt required mechanical ventilation -Has bilateral encephalomalacia based on CT head.  Prior to left frontal craniotomy done.  Prior history of strokes. -Has right anterior cerebral artery aneurysm embolization in the past and clipping recently this year. -MRI cannot be done due to recent clipping. -Appreciate neurology consult.  Provigil dose has been increased without significant change.  Remains alert but not following commands. -Very slow recovery expected at this point.   -S/P PEG placement on June 5th 2020-PEG feeding going well  2. Sepsis-secondary to urinary tract infection.  All cultures have been negative.  Off pressors.  Off steroids. --Finished broad-spectrum antibiotics  3.  Acute renal failure-secondary to acute urinary retention with history of BPH -Appreciate nephrology consult.  Bilateral  hydronephrosis on renal ultrasound. - Status post Foley cath -Renal function is normalized at this time  4. Poor oral intake/Nutrition - now s/p PEG tube placement since mental status recovery will  not be sooner according to neurology. - Patient had a G-tube and tracheostomy which was recently removed couple of months ago and patient was eating by mouth.  -PEG feeding going well  5.  Seizure prevention -prophylactically on Keppra.  6.  DVT prophylaxis-subcutaneous heparin  - Pt was denied LTAC services per CSW. He was from [facility]. CSW working with dter for SNF d/c planning. Pt at present baseline (best new) for d/c -Dr Posey Pronto had peer to peer review with Dr. Aris Georgia with patient's insurance company. To him reviewing at physical therapy notes prior to patient being admitted here he was not performing very well. Patient was wheelchair-bound as well. And given this hospitalization and current mental status he does not foresee patient improving with physical therapy. Patient is at goal rate for his peg feeding. He recommends long-term care. Unfortunately patient does not have long-term care plan under his insurance.  Follw up with Education officer, museum and family PT & OT recommend SNF/LTACH Social worker f/u Will discuss with family disposition plan  CODE STATUS: Full code  TOTAL TIME TAKING CARE OF THIS PATIENT: 24 minutes.   POSSIBLE D/C IN ?  DAYS, DEPENDING ON CLINICAL CONDITION.   Saundra Shelling M.D on 11/11/2018 at 9:54 AM  Between 7am to 6pm - Pager - [phone]  After 6pm go to [email or web address] - password [email or web address] [place] [facility]  Office  [phone]  CC: Primary care physician; Garwin Brothers, MD

## 2018-11-11 NOTE — TOC Progression Note (Signed)
Transition of Care [facility]) - Progression Note    Patient Details  Name: [name] MRN: [id number] Date of Birth: 1955/08/06  Transition of Care [facility]) CM/SW Seth Ward, [place] Phone Number: 11/11/2018, 1:06 PM  Clinical Narrative: CSW received phone call from patient's insurance company regarding placement. CSW explained that daughter was encouraged to start appeal process on Friday after the peer to peer review was denied. CSW also explained that daughter had reported that she was going to pay privately for SNF until Medicaid began.  CSW also received a call from patient's daughter Aimee. Aimee states that she has not started an appeal yet. CSW informed Aimee of appeal process and how to begin appeal. Aimee states that she will begin the appeal process. TOC team will continue to follow for discharge planning.       Expected Discharge Plan: [place] Barriers to Discharge: No SNF bed, SNF Authorization Denied, Barriers Unresolved (comment)(Family plan to private pay for [facility]- no beds available until 6/18)  Expected Discharge Plan and Services Expected Discharge Plan: [place]                                               Social Determinants of Health (SDOH) Interventions    Readmission Risk Interventions Readmission Risk Prevention Plan 11/08/2018  Post Dischage Appt Complete  Medication Screening Complete  Transportation Screening Complete  Some recent data might be hidden

## 2018-11-11 NOTE — Progress Notes (Signed)
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 who presented from his facility unresponsive requiring him to be ventilated in the ED. Patient has been seen in previous years at behavioral health for suicide ideation.     PT Comments    Asleep but awakens to verbal stim.  Participated in exercises as described below.  Pt does move feet - ankle pumps at times but not to command.  Scratched nose with R hand/mit but did not repeat or assist with exercises.    Pt would be appropriate for hoyer or ceiling lift transfer in/out of bed if needed or to be able to spend time in the chair daily.     Follow Up Recommendations  SNF;LTACH     Equipment Recommendations       Recommendations for Other Services       Precautions / Restrictions Precautions Precautions: Fall Restrictions Weight Bearing Restrictions: No    Mobility  Bed Mobility               General bed mobility comments: deferred  Transfers                    Ambulation/Gait                 Stairs             Wheelchair Mobility    Modified Rankin (Stroke Patients Only)       Balance                                            Cognition Arousal/Alertness: Awake/alert Behavior During Therapy: Flat affect Overall Cognitive Status: Difficult to assess                                 General Comments: Did not verbalize or attempt to communicate in anyway. Unable to follow simple commands. followed this therapist with eyes approx 50% of the time, initiates movement at times (to scratch nose) but does not follow commands to repea or assist.      Exercises Other  Exercises Other Exercises: BLE and UE PROM for available ranges in supine.    General Comments        Pertinent Vitals/Pain Pain Assessment: Faces Faces Pain Scale: Hurts a little bit Pain Location: some grimmaces at end ranges but generally tolerates well. Pain Descriptors / Indicators: Grimacing Pain Intervention(s): Limited activity within patient's tolerance;Monitored during session    Home Living                      Prior Function            PT Goals (current goals can now be found in the care plan section) Progress towards PT goals: Not progressing toward goals - comment    Frequency    Min 2X/week      PT Plan Current plan remains appropriate    Co-evaluation              AM-PAC PT "6 Clicks" Mobility  Outcome Measure  Help needed turning from your back to your side while in a flat bed without using bedrails?: Total Help needed moving from lying on your back to sitting on the side of a flat bed without using bedrails?: Total Help needed moving to and from a bed to a chair (including a wheelchair)?: Total Help needed standing up from a chair using your arms (e.g., wheelchair or bedside chair)?: Total Help needed to walk in hospital room?: Total Help needed climbing 3-5 steps with a railing? : Total 6 Click Score: 6    End of Session   Activity Tolerance: Patient tolerated treatment well Patient left: in bed;with call bell/phone within reach;with bed alarm set         Time: 5056-9794 PT Time Calculation (min) (ACUTE ONLY): 12 min  Charges:  $Therapeutic Exercise: 8-22 mins                     Chesley Noon, PTA 11/11/18, 9:11 AM

## 2018-11-11 NOTE — Progress Notes (Signed)
Nutrition Follow-up  RD working remotely.  DOCUMENTATION CODES:   Obesity unspecified  INTERVENTION:  Continue Osmolite 1.5 Cal at goal rate of 60 mL/hr + Pro-Stat 30 mL BID per tube. Provides 2360 kcal, 120 grams of protein, 1094 mL H2O daily.  Continue free water flush of150 mL Q4hrs. This provides a total of 1994 mL H2O daily including water in tube feeding.  Goal regimen meets 100% RDIs for vitamins/minerals.  Continue Juven BID per tube to promote wound healing.  NUTRITION DIAGNOSIS:   Inadequate oral intake related to inability to eat as evidenced by NPO status.  Ongoing - addressing with TF regimen.  GOAL:   Patient will meet greater than or equal to 90% of their needs  Met with TF regimen.  MONITOR:   Diet advancement, Labs, Weight trends, I & O's, Skin  REASON FOR ASSESSMENT:   Consult Enteral/tube feeding initiation and management  ASSESSMENT:   63 year old male with PMHx of HTN, HLD, depression, anxiety, hx CVA, hx drug abuse admitted with acute mental status change in setting of prior cerebral hemorrhage and CVA and requiring intubation on 5/23 for airway protection, also with urinary retention with acute renal failure.  -Patient s/p placement of PEG tube on 6/5.  Patient continues to tolerate goal tube feeding regimen per PEG tube. No issues documented in chart. Still pending discharge plan. Patient having type 4 BMs.  Enteral Access: 20 Fr. Bard gastrostomy tube placed 6/5 by GI; external bumper at 4.5 cm marking  TF: Osmolite at 60 mL/hr + Pro-Stat 30 mL BID + FWF 150 mL Q4hrs  Medications reviewed and include: famotidine, Keppra, Juven BID.  Labs reviewed: CBG 126-140.  I/O: 2175 mL UOP Yesterday (0.9 mL/kg/hr)  Diet Order:   Diet Order    None     EDUCATION NEEDS:   No education needs have been identified at this time  Skin:  Skin Assessment: Skin Integrity Issues:(stg II coccyx (75% wound base granulating); MSAD to  buttocks)  Last BM:  11/11/2018 - large type 4  Height:   Ht Readings from Last 1 Encounters:  11/01/18 6' (1.829 m)   Weight:   Wt Readings from Last 1 Encounters:  11/11/18 100.7 kg   Ideal Body Weight:  80.9 kg  BMI:  Body mass index is 30.11 kg/m.  Estimated Nutritional Needs:   Kcal:  2300-2600kcal/day   Protein:  115-130g/day   Fluid:  2 L/day (25 mL/kg IBW)  Willey Blade, MS, RD, LDN Office: [phone] Pager: [phone] After Hours/Weekend Pager: [phone]

## 2018-11-11 NOTE — Progress Notes (Signed)
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 who presented from his facility unresponsive requiring him to be ventilated in the ED. Patient has been seen in previous years at behavioral health for suicide ideation.    OT comments  [name] was seen for OT treatment on this date. Pt sleeping upon entry, opens eyes partially to and verbal cues. Pt noted to be positioned with head leaned completely to L bed rail. OT provided max assist to reposition pt in bed. BUE PROM performed to minimize risk of joint stiffness and contracture - LUE noted to be more flexed and stiff, was able to increase wrist and composite finger extension with passive prolonged stretch, pt unable to sustain muscle activity. Pt tracked and maintained eye contact with therapist approx 50% of session. Pt did not verbalize or attempt to verbalize during session. Grimaced slightly with LUE PROM. This therapist coordinated with Dr. Estanislado Pandy on this date to initiate order for resting hand splints for pt to prevent flexion contractures and maximize functional positioning of B wrists and hands for optimal functional use. Per secure message with MD, order has been placed and [employer] has been contacted to provide splints for this pt. Will follow acutely. Pt left with room lights on and room TV on to music channel for increased stimulation and promotion of appropriate sleep/wake cycles during hospitalization. Pt making progress since last session, continues tobenefit from OT services for ADL training,UE there. Ex., positioning,and [email or web address] continue to follow POC as written. Frequency and DC recommendation remain appropriate.   Follow Up  Recommendations  SNF;LTACH    Equipment Recommendations  None recommended by OT    Recommendations for Other Services      Precautions / Restrictions Precautions Precautions: Fall Restrictions Weight Bearing Restrictions: No       Mobility Bed Mobility Overal bed mobility: Needs Assistance             General bed mobility comments: deferred  Transfers                 General transfer comment: not safe at this time    Balance Overall balance assessment: Needs assistance     Sitting balance - Comments: not able to assess, dependent transfers                                   ADL either performed or assessed with clinical judgement   ADL Overall ADL's : Needs assistance/impaired                                       General ADL Comments: Continues to require total assist all LB ADLs, max assist for grooming, Pt. is NPO     Vision       Perception     Praxis      Cognition Arousal/Alertness: Awake/alert Behavior During Therapy: Flat affect Overall Cognitive Status: Difficult to assess Area of Impairment: Following commands  General Comments: Did not verbalize or attempt to communicate in anyway. Unable to follow simple commands. followed this therapist with eyes approx 50% of the time, initiates movement at times (to scratch nose) but does not follow commands to raise arm, or grip hands.        Exercises General Exercises - Upper Extremity Shoulder Flexion: PROM;Both;10 reps;Supine Elbow Flexion: PROM;Both;10 reps;Supine Wrist Flexion: PROM;Both;5 reps;Supine Wrist Extension: PROM;Both;5 reps;Supine Composite Extension: PROM;Both;5 reps;Supine Other Exercises Other Exercises: BUE PROM to minimize risk of joint stiffness and contracture - LUE noted to be more flexed and stiff, was able to increase wrist and composite finger extension with passive prolonged stretch, pt  unable to maintain   Shoulder Instructions       General Comments PEG tube in place, mitts on hands at start and end of session.    Pertinent Vitals/ Pain       Pain Location: some grimmaces at end ranges but generally tolerates well. Pain Intervention(s): Limited activity within patient's tolerance;Monitored during session;Repositioned  Home Living                                          Prior Functioning/Environment              Frequency  Min 1X/week        Progress Toward Goals  OT Goals(current goals can now be found in the care plan section)     Acute Rehab OT Goals OT Goal Formulation: Patient unable to participate in goal setting  Plan Discharge plan remains appropriate;Frequency remains appropriate    Co-evaluation                 AM-PAC OT "6 Clicks" Daily Activity     Outcome Measure   Help from another person eating meals?: Total Help from another person taking care of personal grooming?: A Lot Help from another person toileting, which includes using toliet, bedpan, or urinal?: Total Help from another person bathing (including washing, rinsing, drying)?: Total Help from another person to put on and taking off regular upper body clothing?: Total Help from another person to put on and taking off regular lower body clothing?: Total 6 Click Score: 7    End of Session    OT Visit Diagnosis: Other abnormalities of gait and mobility (R26.89);Cognitive communication deficit (R41.841);Muscle weakness (generalized) (M62.81) Symptoms and signs involving cognitive functions: Other cerebrovascular disease   Activity Tolerance Patient tolerated treatment well   Patient Left in bed;with call bell/phone within reach;with bed alarm set   Nurse Communication          Time: 7096-2836 OT Time Calculation (min): 18 min  Charges: OT General Charges $OT Visit: 1 Visit OT Treatments $Therapeutic Exercise: 8-22 mins  Shara Blazing, M.S., OTR/L Ascom: [phone] 11/11/18, 2:57 PM

## 2018-11-11 NOTE — TOC Progression Note (Signed)
Transition of Care [facility]) - Progression Note    Patient Details  Name: [name] MRN: [id number] Date of Birth: 10/05/1955  Transition of Care [facility]) CM/SW Contact  Shelbie Hutching, RN Phone Number: 11/11/2018, 11:25 AM  Clinical Narrative:    RNCM reached out to [facility] at [facility].  Family has reported that they would like to private pay until Medicaid approved since SNF auth denied.  Olivia Mackie reports that they have no beds available today but should have bed available on Thursday 6/18.     Expected Discharge Plan: [place] Barriers to Discharge: No SNF bed, SNF Authorization Denied, Barriers Unresolved (comment)(Family plan to private pay for [facility]- no beds available until 6/18)  Expected Discharge Plan and Services Expected Discharge Plan: [place]                                               Social Determinants of Health (SDOH) Interventions    Readmission Risk Interventions Readmission Risk Prevention Plan 11/08/2018  Post Dischage Appt Complete  Medication Screening Complete  Transportation Screening Complete  Some recent data might be hidden

## 2018-11-11 NOTE — Care Management Important Message (Signed)
Important Message  Patient Details  Name: [name] MRN: [id number] Date of Birth: [birth year]   Medicare Important Message Given:  Yes    [name] A Laynie Espy 11/11/2018, 11:00 AM

## 2018-11-11 NOTE — Plan of Care (Signed)

## 2018-11-12 LAB — GLUCOSE, CAPILLARY
Glucose-Capillary: 137 mg/dL — ABNORMAL HIGH (ref 70–99)
Glucose-Capillary: 132 mg/dL — ABNORMAL HIGH (ref 70–99)
Glucose-Capillary: 147 mg/dL — ABNORMAL HIGH (ref 70–99)
Glucose-Capillary: 124 mg/dL — ABNORMAL HIGH (ref 70–99)
Glucose-Capillary: 146 mg/dL — ABNORMAL HIGH (ref 70–99)

## 2018-11-12 NOTE — Progress Notes (Signed)
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 who presented from his facility unresponsive requiring him to be ventilated in the ED. Patient has been seen in previous years at behavioral health for suicide ideation.    OT comments  Pt seen for OT tx this date. Pt alert and tracks therapist during session well. Continues to be unable to follow commands, but muscle activation noted during PROM ex for BUE. Bilat resting hand splints ordered per MD in chart, in place during OT session. Removed and reapplied for optimal positioning, skin checked for breakdown (none noted). Educated Chartered certified accountant to assess for skin breakdown regularly. Nurse tech verbalized understanding. When asked if he was okay, pt looked at therapist and nodded very slightly. Unable to replicate when requested but demonstrating progress since previous sessions. Continues to benefit from skilled OT services to maximize return to PLOF. Continue to recommend SNF vs LTACH.    Follow Up Recommendations  SNF;LTACH    Equipment Recommendations  None recommended by OT    Recommendations for Other Services      Precautions / Restrictions Precautions Precautions: Fall Restrictions Weight Bearing Restrictions: No Other Position/Activity Restrictions: bilat resting hand splints - continue to assess for optimal positioning/fitting, skin integrity       Mobility Bed Mobility                  Transfers                      Balance                                           ADL either performed or assessed with clinical judgement   ADL                                         General  ADL Comments: Continues to require total assist all LB ADLs, max assist for grooming, Pt. is NPO     Vision   Additional Comments: pt able to track therapist in room 100% of session   Perception     Praxis      Cognition Arousal/Alertness: Awake/alert Behavior During Therapy: Flat affect Overall Cognitive Status: Difficult to assess                                 General Comments: pt unable to follow commands, tracks therapist, initiates movement (to scratch chin) but unable to complete        Exercises Other Exercises Other Exercises: BUE PROM to minimize risk of joint stiffness and contracture - RUE noted to have increased tone, required more prolonged stretching to achieve increased extension, pt unable to maintain Other Exercises: bilat resting hand splints ordered and on, displaced on RUE; both removed and skin checked for breakdown/redness (none noted), replaced with optimal positioning after BUE PROM   Shoulder Instructions       General Comments  Pertinent Vitals/ Pain       Pain Assessment: Faces Faces Pain Scale: No hurt Pain Intervention(s): Monitored during session  Home Living                                          Prior Functioning/Environment              Frequency  Min 1X/week        Progress Toward Goals  OT Goals(current goals can now be found in the care plan section)  Progress towards OT goals: Progressing toward goals  Acute Rehab OT Goals OT Goal Formulation: Patient unable to participate in goal setting  Plan Discharge plan remains appropriate;Frequency needs to be updated    Co-evaluation                 AM-PAC OT "6 Clicks" Daily Activity     Outcome Measure   Help from another person eating meals?: Total Help from another person taking care of personal grooming?: A Lot Help from another person toileting, which includes using toliet, bedpan, or urinal?: Total Help from another  person bathing (including washing, rinsing, drying)?: Total Help from another person to put on and taking off regular upper body clothing?: Total Help from another person to put on and taking off regular lower body clothing?: Total 6 Click Score: 7    End of Session    OT Visit Diagnosis: Other abnormalities of gait and mobility (R26.89);Cognitive communication deficit (R41.841);Muscle weakness (generalized) (M62.81) Symptoms and signs involving cognitive functions: Other cerebrovascular disease   Activity Tolerance Patient tolerated treatment well   Patient Left in bed;with call bell/phone within reach;with bed alarm set   Nurse Communication (notified RN about tube feeds on hold)        Time: 8144-8185 OT Time Calculation (min): 16 min  Charges: OT General Charges $OT Visit: 1 Visit OT Treatments $Therapeutic Exercise: 8-22 mins   Jeni Salles, MPH, MS, OTR/L ascom [phone] 11/12/18, 5:05 PM

## 2018-11-12 NOTE — Progress Notes (Addendum)
[place] at [place] NAME: [name]    MR#:  [id number]  DATE OF BIRTH:  17-Aug-1955  SUBJECTIVE:   -pt opens eyes on verbal commands. Tracks with eyes in the room. Non verbal -s/p PEG tube placement No new issues over night  s/p PT and OT evaluation Awake, non verbal Has bilateral hand splints obtained from bio medical after recommendation by Occupational Therapy REVIEW OF SYSTEMS:  Review of Systems  Unable to perform ROS: Mental status change    DRUG ALLERGIES:   Allergies  Allergen Reactions  . Metformin Diarrhea    VITALS:  Blood pressure 136/81, pulse 92, temperature 98.4 F (36.9 C), temperature source Oral, resp. rate 18, height 6' (1.829 m), weight 98.2 kg, SpO2 94 %.  PHYSICAL EXAMINATION:  Physical Exam   GENERAL:  63 y.o.-year-old chronically ill-appearing patient lying in the bed with no acute distress. EYES: Pupils equal, round, reactive to light and accommodation. No scleral icterus.  HEENT: Head atraumatic, normocephalic. Oropharynx and nasopharynx clear.  NECK:  Supple, no jugular venous distention. No thyroid enlargement, no tenderness.  LUNGS: Normal breath sounds bilaterally, no wheezing, rales,rhonchi or crepitation. No use of accessory muscles of respiration.  CARDIOVASCULAR: S1, S2 normal. No murmurs, rubs, or gallops.  ABDOMEN: Soft, nontender, nondistended. Bowel sounds present. No organomegaly or mass. Foley+  PEG+ EXTREMITIES: No pedal edema, cyanosis, or clubbing.  NEUROLOGIC: No obvious facial droop noted.Marland Kitchen able to track some, opening eyes to [employer]   Not following any commands.  Alert but non verbal.   Sensation intact. Gait not checked.   Has 2+ symmetric bilateral lower extremity reflexes PSYCHIATRIC: patient is alert but not oriented, remains nonverbal SKIN: No obvious rash, lesion, or ulcer--details per RN assessment   LABORATORY PANEL:   CBC Recent Labs  Lab 11/08/18 0458  WBC 10.3   HGB 10.0*  HCT 31.3*  PLT 351   ------------------------------------------------------------------------------------------------------------------  Chemistries  No results for input(s): NA, K, CL, CO2, GLUCOSE, BUN, CREATININE, CALCIUM, MG, AST, ALT, ALKPHOS, BILITOT in the last 168 hours.  Invalid input(s): GFRCGP ------------------------------------------------------------------------------------------------------------------  Cardiac Enzymes No results for input(s): TROPONINI in the last 168 hours. ------------------------------------------------------------------------------------------------------------------  RADIOLOGY:  No results found.  EKG:   Orders placed or performed in visit on 04/13/17  . 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  1. Acute encephalopathy-suspected secondary to hypoglycemia when pt presented to the hospital -pt required mechanical ventilation -Has bilateral encephalomalacia based on CT head.  Prior to left frontal craniotomy done.  Prior history of strokes. -Has right anterior cerebral artery aneurysm embolization in the past and clipping recently this year. -MRI cannot be done due to recent clipping. -Appreciate neurology consult.  Provigil dose has been increased without significant change.  Remains alert but not following commands. -Very slow recovery expected at this point.   -S/P PEG placement on June 5th 2020-PEG feeding going well  2. Sepsis-secondary to urinary tract infection.  All cultures have been negative.  Off pressors.  Off steroids. --Finished broad-spectrum antibiotics  3.  Acute renal failure-secondary to acute urinary retention with history of BPH -Appreciate  nephrology consult.  Bilateral hydronephrosis on renal ultrasound. - Status post Foley cath -Renal function is normalized at this time  4. Poor oral intake/Nutrition - now s/p PEG tube placement since mental status recovery will not be sooner according to neurology. - Patient had a G-tube and tracheostomy which was recently removed couple of months ago and patient was eating by mouth.  -PEG feeding going well  5.  Seizure prevention -prophylactically on Keppra.  6.  DVT prophylaxis-subcutaneous heparin  - Pt was denied LTAC services per CSW. He was from [facility]. CSW working with dter for SNF d/c planning. Pt at present baseline (best new) for d/c -Dr Posey Pronto had peer to peer review with Dr. Aris Georgia with patient's insurance company. To him reviewing at physical therapy notes prior to patient being admitted here he was not performing very well. Patient was wheelchair-bound as well. And given this hospitalization and current mental status he does not foresee patient improving with physical therapy. Patient is at goal rate for his peg feeding. He recommends long-term care. Unfortunately patient does not have long-term care plan under his insurance.  Follw up with Education officer, museum and family PT & OT recommend SNF/LTACH Social worker f/u  7.  Insurance authorization to SNF denied.  Family plans to play private for [facility]  No beds available currently.  Follow-up with Education officer, museum. Family updated.  CODE STATUS: Full code  TOTAL TIME TAKING CARE OF THIS PATIENT: 24 minutes.   POSSIBLE D/C IN ?  DAYS, DEPENDING ON CLINICAL CONDITION.   Saundra Shelling M.D on 11/12/2018 at 10:15 AM  Between 7am to 6pm - Pager - [phone]  After 6pm go to [email or web address] - password [email or web address] [place] [facility]  Office  [phone]  CC: Primary care physician; Garwin Brothers, MD

## 2018-11-12 NOTE — Progress Notes (Signed)
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 who presented from his facility unresponsive requiring him to be ventilated in the ED. Patient has been seen in previous years at behavioral health for suicide ideation.     PT Comments    Patient's session today was limited by excessive fatigue/lethargy. Patient continuously fell back asleep requiring tactile and verbal stimuli to awaken patient for continuation of interventions. Due to lethargy attempt OOB with hoyer lift is deferred to another session.  Patient has high tone that is decreased with overpressure of first met head. Patient requires use of a splint/brace for L foot for neutral foot position as requested in previous documentation. Current POC remains appropriate at this time.   Follow Up Recommendations  SNF;LTACH     Equipment Recommendations       Recommendations for Other Services       Precautions / Restrictions Precautions Precautions: Fall Restrictions Weight Bearing Restrictions: No    Mobility  Bed Mobility Overal bed mobility: Needs Assistance             General bed mobility comments: deferred due to patient continuously falling asleep  Transfers                 General transfer comment: not safe at this time  Ambulation/Gait                 Stairs             Wheelchair Mobility    Modified Rankin (Stroke Patients Only)       Balance Overall balance assessment: Needs assistance     Sitting balance - Comments: not able to assess, dependent transfers                                    Cognition Arousal/Alertness: Lethargic Behavior During Therapy: Flat  affect Overall Cognitive Status: Difficult to assess Area of Impairment: Following commands                               General Comments: Did not verbalize or attempt to communicate in anyway. Unable to follow simple commands. followed this therapist with eyes approx 50% of the time, initiates movement at times (to scratch nose) but does not follow commands to raise arm, or grip hands.      Exercises General Exercises - Lower Extremity Ankle Circles/Pumps: AAROM;Right;PROM;Left;10 reps;Supine Heel Slides: AAROM;Right;PROM;Left;10 reps;Supine Hip ABduction/ADduction: PROM;Both;10 reps;Supine Straight Leg Raises: AAROM;Right;PROM;Left;10 reps;Supine    General Comments        Pertinent Vitals/Pain Pain Assessment: Faces Faces Pain Scale: Hurts little more Pain Location: grimaces with hip flexion Pain Descriptors / Indicators: Grimacing Pain Intervention(s): Limited activity within patient's tolerance;Monitored during session;Repositioned    Home Living                      Prior Function            PT Goals (current goals can now be found in the care plan section) Progress towards PT goals: Not progressing toward goals - comment  Frequency    Min 2X/week      PT Plan Current plan remains appropriate    Co-evaluation              AM-PAC PT "6 Clicks" Mobility   Outcome Measure  Help needed turning from your back to your side while in a flat bed without using bedrails?: Total Help needed moving from lying on your back to sitting on the side of a flat bed without using bedrails?: Total Help needed moving to and from a bed to a chair (including a wheelchair)?: Total Help needed standing up from a chair using your arms (e.g., wheelchair or bedside chair)?: Total Help needed to walk in hospital room?: Total Help needed climbing 3-5 steps with a railing? : Total 6 Click Score: 6    End of Session   Activity Tolerance: Patient limited  by lethargy Patient left: in bed;with call bell/phone within reach;with bed alarm set Nurse Communication: Mobility status       Time: 9163-8466 PT Time Calculation (min) (ACUTE ONLY): 12 min  Charges:  $Therapeutic Exercise: 8-22 mins                     Janna Arch, PT, DPT     Janna Arch 11/12/2018, 8:52 AM

## 2018-11-13 ENCOUNTER — Inpatient Hospital Stay (HOSPITAL_COMMUNITY)
Admission: RE | Admit: 2018-11-13 | Payer: Medicare HMO | Source: Intra-hospital | Admitting: Physical Medicine & Rehabilitation

## 2018-11-13 LAB — GLUCOSE, CAPILLARY
Glucose-Capillary: 121 mg/dL — ABNORMAL HIGH (ref 70–99)
Glucose-Capillary: 122 mg/dL — ABNORMAL HIGH (ref 70–99)
Glucose-Capillary: 125 mg/dL — ABNORMAL HIGH (ref 70–99)
Glucose-Capillary: 129 mg/dL — ABNORMAL HIGH (ref 70–99)
Glucose-Capillary: 136 mg/dL — ABNORMAL HIGH (ref 70–99)
Glucose-Capillary: 142 mg/dL — ABNORMAL HIGH (ref 70–99)

## 2018-11-13 NOTE — Progress Notes (Signed)
[place] at [place] NAME: Sebert Stollings    MR#:  [id number]  DATE OF BIRTH:  1955/11/25  SUBJECTIVE:   -pt opens eyes on verbal commands. Tracks with eyes in the room. Non verbal -s/p PEG tube placement No new issues over night  s/p PT and OT evaluation Awake, non verbal Has bilateral hand splints obtained from [facility] after recommendation by OT  Occupational Therapy f/u done REVIEW OF SYSTEMS:  Review of Systems  Unable to perform ROS: Mental status change    DRUG ALLERGIES:   Allergies  Allergen Reactions  . Metformin Diarrhea    VITALS:  Blood pressure 117/77, pulse 86, temperature 98.5 F (36.9 C), temperature source Oral, resp. rate 16, height 6' (1.829 m), weight 98 kg, SpO2 93 %.  PHYSICAL EXAMINATION:  Physical Exam   GENERAL:  63 y.o.-year-old chronically ill-appearing patient lying in the bed with no acute distress. EYES: Pupils equal, round, reactive to light and accommodation. No scleral icterus.  HEENT: Head atraumatic, normocephalic. Oropharynx and nasopharynx clear.  NECK:  Supple, no jugular venous distention. No thyroid enlargement, no tenderness.  LUNGS: Normal breath sounds bilaterally, no wheezing, rales,rhonchi or crepitation. No use of accessory muscles of respiration.  CARDIOVASCULAR: S1, S2 normal. No murmurs, rubs, or gallops.  ABDOMEN: Soft, nontender, nondistended. Bowel sounds present. No organomegaly or mass. Foley+  PEG+ EXTREMITIES: No pedal edema, cyanosis, or clubbing.  NEUROLOGIC: No obvious facial droop noted.Marland Kitchen able to track some, opening eyes to [facility]   Not following any commands.  Alert but non verbal.   Sensation intact. Gait not checked.   Has 2+ symmetric bilateral lower extremity reflexes PSYCHIATRIC: patient is alert but not oriented, remains nonverbal SKIN: No obvious rash, lesion, or ulcer--details per RN assessment   LABORATORY PANEL:   CBC Recent Labs  Lab 11/08/18 0458   WBC 10.3  HGB 10.0*  HCT 31.3*  PLT 351   ------------------------------------------------------------------------------------------------------------------  Chemistries  No results for input(s): NA, K, CL, CO2, GLUCOSE, BUN, CREATININE, CALCIUM, MG, AST, ALT, ALKPHOS, BILITOT in the last 168 hours.  Invalid input(s): GFRCGP ------------------------------------------------------------------------------------------------------------------  Cardiac Enzymes No results for input(s): TROPONINI in the last 168 hours. ------------------------------------------------------------------------------------------------------------------  RADIOLOGY:  No results found.  EKG:   Orders placed or performed in visit on 04/13/17  . 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  1. Acute encephalopathy-suspected secondary to hypoglycemia when pt presented to the hospital -pt required mechanical ventilation -Has bilateral encephalomalacia based on CT head.  Prior to left frontal craniotomy done.  Prior history of strokes. -Has right anterior cerebral artery aneurysm embolization in the past and clipping recently this year. -MRI cannot be done due to recent clipping. -Appreciate neurology consult.  Provigil dose has been increased without significant change.  Remains alert but not following commands. -Very slow recovery expected at this point.   -S/P PEG placement on June 5th 2020-PEG feeding going well  2. Sepsis-secondary to urinary tract infection.  All cultures have been negative.  Off pressors.  Off steroids. --Finished broad-spectrum antibiotics  3.  Acute renal failure-secondary to acute urinary retention with history of BPH  -Appreciate nephrology consult.  Bilateral hydronephrosis on renal ultrasound. - Status post Foley cath -Renal function is  normalized at this time  4. Poor oral intake/Nutrition - now s/p PEG tube placement since mental status recovery will not be sooner according to neurology. - Patient had a G-tube and tracheostomy which was recently removed couple of months ago and patient was eating by mouth.  -PEG feeding going well  5.  Seizure prevention -prophylactically on Keppra.  6.  DVT prophylaxis-subcutaneous heparin  - Pt was denied LTAC services per CSW. He was from [facility]. CSW working with dter for SNF d/c planning. Pt at present baseline (best new) for d/c -Dr Posey Pronto had peer to peer review with Dr. Aris Georgia with patient's insurance company. To him reviewing at physical therapy notes prior to patient being admitted here he was not performing very well. Patient was wheelchair-bound as well. And given this hospitalization and current mental status he does not foresee patient improving with physical therapy. Patient is at goal rate for his peg feeding. He recommends long-term care. Unfortunately patient does not have long-term care plan under his insurance.  Follw up with Education officer, museum and family PT & OT recommend SNF/LTACH Social worker f/u  7.  Insurance authorization to SNF denied.  Family plans to play private for [facility].  No beds available currently.  Follow-up with social worker again. Family updated.  CODE STATUS: Full code  TOTAL TIME TAKING CARE OF THIS PATIENT: 23 minutes.   POSSIBLE D/C IN ?  DAYS, DEPENDING ON CLINICAL CONDITION.   Saundra Shelling M.D on 11/13/2018 at 10:10 AM  Between 7am to 6pm - Pager - [phone]  After 6pm go to [email or web address] - password [email or web address] [place] [facility]  Office  [phone]  CC: Primary care physician; Garwin Brothers, MD

## 2018-11-13 NOTE — TOC Progression Note (Signed)
Transition of Care [facility]) - Progression Note    Patient Details  Name: [name] MRN: [id number] Date of Birth: 01-Jan-1956  Transition of Care [facility]) CM/SW Contact  Shelbie Hutching, RN Phone Number: 11/13/2018, 4:35 PM  Clinical Narrative:    RNCM received a call from [facility] with [facility] insurance reporting that the patient was approved for [facility] [facility] back on 6/11.  Message left with [facility] for return call to check bed availability.  Jeannie Done reports that she will reach out to the patient's daughter.  RNCM will call patient's daughter tomorrow to follow up.    Expected Discharge Plan: [place] Barriers to Discharge: No SNF bed, SNF Authorization Denied, Barriers Unresolved (comment)(Family plan to private pay for [facility]- no beds available until 6/18)  Expected Discharge Plan and Services Expected Discharge Plan: [place]                                               Social Determinants of Health (SDOH) Interventions    Readmission Risk Interventions Readmission Risk Prevention Plan 11/08/2018  Post Dischage Appt Complete  Medication Screening Complete  Transportation Screening Complete  Some recent data might be hidden

## 2018-11-13 NOTE — Care Management Important Message (Signed)
Important Message  Patient Details  Name: [name] MRN: [id number] Date of Birth: 03/24/1956   Medicare Important Message Given:  Yes    [name] A Lynwood Kubisiak 11/13/2018, 10:43 AM

## 2018-11-13 NOTE — TOC Progression Note (Signed)
Transition of Care [facility]) - Progression Note    Patient Details  Name: HOYLE BARKDULL MRN: [id number] Date of Birth: 05/11/1956  Transition of Care [facility]) CM/SW Contact  Shelbie Hutching, RN Phone Number: 11/13/2018, 5:02 PM  Clinical Narrative:    Patient's daughter really wants the patient to go to the [facility] in [place].  Request for bed faxed to [facility] with FL2 and PT notes.  TOC team to follow up with daughter and [facility] tomorrow.  Marcy with [employer] reports that it is a simple matter of switching authorization over to the [facility] from [facility].    Expected Discharge Plan: [place] Barriers to Discharge: No SNF bed, SNF Authorization Denied, Barriers Unresolved (comment)(Family plan to private pay for [facility]- no beds available until 6/18)  Expected Discharge Plan and Services Expected Discharge Plan: [place]                                               Social Determinants of Health (SDOH) Interventions    Readmission Risk Interventions Readmission Risk Prevention Plan 11/08/2018  Post Dischage Appt Complete  Medication Screening Complete  Transportation Screening Complete  Some recent data might be hidden

## 2018-11-14 LAB — GLUCOSE, CAPILLARY
Glucose-Capillary: 110 mg/dL — ABNORMAL HIGH (ref 70–99)
Glucose-Capillary: 135 mg/dL — ABNORMAL HIGH (ref 70–99)
Glucose-Capillary: 136 mg/dL — ABNORMAL HIGH (ref 70–99)
Glucose-Capillary: 136 mg/dL — ABNORMAL HIGH (ref 70–99)
Glucose-Capillary: 146 mg/dL — ABNORMAL HIGH (ref 70–99)
Glucose-Capillary: 151 mg/dL — ABNORMAL HIGH (ref 70–99)

## 2018-11-14 NOTE — Progress Notes (Signed)
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 who presented from his facility unresponsive requiring him to be ventilated in the ED. Patient has been seen in previous years at behavioral health for suicide ideation.     PT Comments    Pt more alert today during session tapping R toes upon request/initiation.  Does not assist in other exercises however.  Repositioned L hand splint as it was sliding off.  No redness noted.   Follow Up Recommendations  SNF;LTACH     Equipment Recommendations  None recommended by PT    Recommendations for Other Services       Precautions / Restrictions Precautions Precautions: Fall Restrictions Weight Bearing Restrictions: No    Mobility  Bed Mobility                  Transfers                    Ambulation/Gait                 Stairs             Wheelchair Mobility    Modified Rankin (Stroke Patients Only)       Balance                                            Cognition Arousal/Alertness: Awake/alert Behavior During Therapy: Flat affect Overall Cognitive Status: Difficult to assess                                        Exercises Other Exercises Other Exercises: BLE and UE PROM for available ranges in supine.    General Comments        Pertinent Vitals/Pain Pain Assessment: No/denies pain    Home Living                      Prior Function            PT Goals (current goals can now be found in the care plan section) Progress towards PT goals: Progressing toward goals    Frequency    Min 2X/week      PT Plan Current plan remains appropriate     Co-evaluation              AM-PAC PT "6 Clicks" Mobility   Outcome Measure  Help needed turning from your back to your side while in a flat bed without using bedrails?: Total Help needed moving from lying on your back to sitting on the side of a flat bed without using bedrails?: Total Help needed moving to and from a bed to a chair (including a wheelchair)?: Total Help needed standing up from a chair using your arms (e.g., wheelchair or bedside chair)?: Total Help needed to walk in hospital room?: Total Help needed climbing 3-5 steps with a railing? : Total 6 Click Score: 6    End of Session   Activity Tolerance: Patient tolerated treatment well Patient left: in  bed;with call bell/phone within reach;with bed alarm set         Time: 1610-9604 PT Time Calculation (min) (ACUTE ONLY): 10 min  Charges:  $Therapeutic Exercise: 8-22 mins                     Chesley Noon, PTA 11/14/18, 4:18 PM

## 2018-11-14 NOTE — TOC Progression Note (Signed)
Transition of Care [facility]) - Progression Note    Patient Details  Name: [name] MRN: [id number] Date of Birth: 27-Mar-1956  Transition of Care [facility]) CM/SW Contact  Shelbie Hutching, RN Phone Number: 11/14/2018, 3:26 PM  Clinical Narrative:    RNCM has spoken with patient's daughter [name] twice today- [name] in [place] declines to offer a bed,  -[name] request that we call [name] in [place] -Not able to get through to admissions at [name] now requests for patient to be transferred to [facility] for second opinion - [name] with [name] called to report she may have a bed- Hoover was told to [name] and she really wants to get her father closer to [name] where she lives.  Co pay for stay at [facility] place will be >$3,500- [name].  -[name] will call and speak with daughter.   Expected Discharge Plan: [place] Barriers to Discharge: No SNF bed, SNF Authorization Denied, Barriers Unresolved (comment)(Family plan to private pay for [facility]- no beds available until 6/18)  Expected Discharge Plan and Services Expected Discharge Plan: [place]                                               Social Determinants of Health (SDOH) Interventions    Readmission Risk Interventions Readmission Risk Prevention Plan 11/08/2018  Post Dischage Appt Complete  Medication Screening Complete  Transportation Screening Complete  Some recent data might be hidden

## 2018-11-14 NOTE — TOC Progression Note (Signed)
Transition of Care [facility]) - Progression Note    Patient Details  Name: [name] MRN: [id number] Date of Birth: 1955/11/10  Transition of Care [facility]) CM/SW Contact  Shelbie Hutching, RN Phone Number: 11/14/2018, 8:30 AM  Clinical Narrative:    Referral faxed to [facility] in [place].    Expected Discharge Plan: [place] Barriers to Discharge: No SNF bed, SNF Authorization Denied, Barriers Unresolved (comment)(Family plan to private pay for [facility]- no beds available until 6/18)  Expected Discharge Plan and Services Expected Discharge Plan: [place]                                               Social Determinants of Health (SDOH) Interventions    Readmission Risk Interventions Readmission Risk Prevention Plan 11/08/2018  Post Dischage Appt Complete  Medication Screening Complete  Transportation Screening Complete  Some recent data might be hidden

## 2018-11-14 NOTE — Progress Notes (Signed)
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 who presented from his facility unresponsive requiring him to be ventilated in the ED. Patient has been seen in previous years at behavioral health for suicide ideation.    OT comments  Pt. Tolerated PROM to all joint ranges of the BUEs. Pt. Was assisted with repositioning in bed. Bilateral splints were applied. Pt. Tolerated splint application today.  Pt. Presents with flexor tightness in the LUE. Education was provided to the pt.'s nurse, and the nurse assistant about splint position, splint care, palmar skin checks, and application. Pt. Continues to benefit from OT services for ADL training, positioning, and Pt. caregiver Education about splint care.   ollow Up Recommendations  SNF    Equipment Recommendations       Recommendations for Other Services      Precautions / Restrictions Precautions Precautions: Fall Restrictions Other Position/Activity Restrictions: bilat resting hand splints - continue to assess for optimal positioning/fitting, skin integrity       Mobility Bed Mobility    Total Assist              Transfers      deferred                Balance                                           ADL either performed or assessed with clinical judgement   ADL Overall ADL's : Needs assistance/impaired Eating/Feeding: NPO                                     General ADL Comments: Pt. continues to require total assist all LB ADLs, max assist for grooming, Pt. continues to be NPO     Vision       Perception     Praxis      Cognition Arousal/Alertness: Awake/alert                                               Exercises     Shoulder Instructions       General Comments      Pertinent Vitals/ Pain       Pain Assessment: Faces Faces Pain Scale: Hurts a little bit Pain Location: grimimaces initially with left digit ROM secondary to flexion tightness. Pain Descriptors / Indicators: Grimacing  Home Living                                          Prior Functioning/Environment              Frequency  Min 1X/week        Progress Toward Goals  OT Goals(current goals can now be found in the care plan section)  Progress towards OT goals: Progressing toward goals  Acute Rehab OT Goals OT Goal Formulation: Patient unable to participate in goal setting  Plan Discharge plan remains appropriate    Co-evaluation                 AM-PAC OT "6 Clicks" Daily Activity     Outcome Measure   Help from another person eating meals?: Total Help from another person taking care of personal grooming?: A Lot Help from another person toileting, which includes using toliet, bedpan, or urinal?: Total Help from another person bathing (including washing, rinsing, drying)?: Total Help from another person to put on and taking off regular upper body clothing?: Total   6 Click Score: 6    End of Session        Activity Tolerance Patient tolerated treatment well   Patient Left in bed;with call bell/phone within reach;with bed alarm set   Nurse Communication          Time: 1833-5825 OT Time Calculation (min): 26 min  Charges: OT General Charges $OT Visit: 1 Visit OT Treatments $Self Care/Home Management : 23-37 mins  Harrel Carina, MS, OTR/L   Harrel Carina 11/14/2018, 9:20 AM

## 2018-11-14 NOTE — Progress Notes (Signed)
[place] at [place] NAME: [name]    MR#:  [id number]  DATE OF BIRTH:  08-02-1955  SUBJECTIVE:   -pt opens eyes on verbal commands. Tracks with eyes in the room. Non verbal -s/p PEG tube placement No new issues over night  s/p PT and OT evaluation Awake, non verbal Has bilateral hand splints obtained from [facility] after recommendation by OT  Occupational Therapy f/u done No new overnight events REVIEW OF SYSTEMS:  Review of Systems  Unable to perform ROS: Mental status change    DRUG ALLERGIES:   Allergies  Allergen Reactions  . Metformin Diarrhea    VITALS:  Blood pressure (!) 109/55, pulse 93, temperature 98.9 F (37.2 C), temperature source Oral, resp. rate 20, height 6' (1.829 m), weight 95.1 kg, SpO2 97 %.  PHYSICAL EXAMINATION:  Physical Exam   GENERAL:  63 y.o.-year-old chronically ill-appearing patient lying in the bed with no acute distress. EYES: Pupils equal, round, reactive to light and accommodation. No scleral icterus.  HEENT: Head atraumatic, normocephalic. Oropharynx and nasopharynx clear.  NECK:  Supple, no jugular venous distention. No thyroid enlargement, no tenderness.  LUNGS: Normal breath sounds bilaterally, no wheezing, rales,rhonchi or crepitation. No use of accessory muscles of respiration.  CARDIOVASCULAR: S1, S2 normal. No murmurs, rubs, or gallops.  ABDOMEN: Soft, nontender, nondistended. Bowel sounds present. No organomegaly or mass. Foley+  PEG+ EXTREMITIES: No pedal edema, cyanosis, or clubbing.  NEUROLOGIC: No obvious facial droop noted.Marland Kitchen able to track some, opening eyes to [facility]   Not following any commands.  Alert but non verbal.   Sensation intact. Gait not checked.   Has 2+ symmetric bilateral lower extremity reflexes PSYCHIATRIC: patient is alert but not oriented, remains nonverbal SKIN: No obvious rash, lesion, or ulcer--details per RN assessment   LABORATORY PANEL:   CBC  Recent Labs  Lab 11/08/18 0458  WBC 10.3  HGB 10.0*  HCT 31.3*  PLT 351   ------------------------------------------------------------------------------------------------------------------  Chemistries  No results for input(s): NA, K, CL, CO2, GLUCOSE, BUN, CREATININE, CALCIUM, MG, AST, ALT, ALKPHOS, BILITOT in the last 168 hours.  Invalid input(s): GFRCGP ------------------------------------------------------------------------------------------------------------------  Cardiac Enzymes No results for input(s): TROPONINI in the last 168 hours. ------------------------------------------------------------------------------------------------------------------  RADIOLOGY:  No results found.  EKG:   Orders placed or performed in visit on 04/13/17  . 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  1. Acute encephalopathy-suspected secondary to hypoglycemia when pt presented to the hospital -pt required mechanical ventilation -Has bilateral encephalomalacia based on CT head.  Prior to left frontal craniotomy done.  Prior history of strokes. -Has right anterior cerebral artery aneurysm embolization in the past and clipping recently this year. -MRI cannot be done due to recent clipping. -Appreciate neurology consult.  Provigil dose has been increased without significant change.  Remains alert but not following commands. -Very slow recovery expected at this point.   -S/P PEG placement on June 5th 2020-PEG feeding going well  2. Sepsis-secondary to urinary tract infection.  All cultures have been negative.  Off pressors.  Off steroids. --Finished broad-spectrum antibiotics  3.  Acute renal failure-secondary to acute urinary  retention with history of BPH -Appreciate nephrology consult.  Bilateral hydronephrosis on renal ultrasound. - Status post  Foley cath -Renal function is normalized at this time  4. Poor oral intake/Nutrition - now s/p PEG tube placement since mental status recovery will not be sooner according to neurology. - Patient had a G-tube and tracheostomy which was recently removed couple of months ago and patient was eating by mouth.  -PEG feeding going well  5.  Seizure prevention -prophylactically on Keppra.  6.  DVT prophylaxis-subcutaneous heparin  - Pt was denied LTAC services per CSW. He was from [facility]. CSW working with dter for SNF d/c planning. Pt at present baseline (best new) for d/c -Dr Posey Pronto had peer to peer review with Dr. Aris Georgia with patient's insurance company. To him reviewing at physical therapy notes prior to patient being admitted here he was not performing very well. Patient was wheelchair-bound as well. And given this hospitalization and current mental status he does not foresee patient improving with physical therapy. Patient is at goal rate for his peg feeding. He recommends long-term care. Unfortunately patient does not have long-term care plan under his insurance.  Follw up with Education officer, museum and family PT & OT recommend SNF/LTACH Social worker f/u  7.  Insurance authorization to SNF denied.  Family plans to play private for [facility].  Follow-up with social worker again. Family updated.  8.  Await placement  CODE STATUS: Full code  TOTAL TIME TAKING CARE OF THIS PATIENT: 23 minutes.   POSSIBLE D/C IN ?  DAYS, DEPENDING ON CLINICAL CONDITION.   Saundra Shelling M.D on 11/14/2018 at 11:45 AM  Between 7am to 6pm - Pager - [phone]  After 6pm go to [email or web address] - password [email or web address] [place] [facility]  Office  [phone]  CC: Primary care physician; Garwin Brothers, MD

## 2018-11-15 LAB — GLUCOSE, CAPILLARY
Glucose-Capillary: 150 mg/dL — ABNORMAL HIGH (ref 70–99)
Glucose-Capillary: 155 mg/dL — ABNORMAL HIGH (ref 70–99)
Glucose-Capillary: 125 mg/dL — ABNORMAL HIGH (ref 70–99)
Glucose-Capillary: 146 mg/dL — ABNORMAL HIGH (ref 70–99)
Glucose-Capillary: 148 mg/dL — ABNORMAL HIGH (ref 70–99)

## 2018-11-15 NOTE — Progress Notes (Addendum)
[place] at [place] NAME: Jovahn Breit    MR#:  [id number]  DATE OF BIRTH:  07-30-1955  SUBJECTIVE:   -pt opens eyes on verbal commands. Tracks with eyes in the room. Non verbal -s/p PEG tube placement No new issues over night  s/p PT and OT evaluation Awake, non verbal Has bilateral hand splints obtained from [facility] after recommendation by OT  Occupational Therapy f/u done No new overnight events REVIEW OF SYSTEMS:  Review of Systems  Unable to perform ROS: Mental status change    DRUG ALLERGIES:   Allergies  Allergen Reactions   Metformin Diarrhea    VITALS:  Blood pressure 133/75, pulse 86, temperature 99.4 F (37.4 C), temperature source Axillary, resp. rate 20, height 6' (1.829 m), weight 100.8 kg, SpO2 100 %.  PHYSICAL EXAMINATION:  Physical Exam   GENERAL:  63 y.o.-year-old chronically ill-appearing patient lying in the bed with no acute distress. EYES: Pupils equal, round, reactive to light and accommodation. No scleral icterus.  HEENT: Head atraumatic, normocephalic. Oropharynx and nasopharynx clear.  NECK:  Supple, no jugular venous distention. No thyroid enlargement, no tenderness.  LUNGS: Normal breath sounds bilaterally, no wheezing, rales,rhonchi or crepitation. No use of accessory muscles of respiration.  CARDIOVASCULAR: S1, S2 normal. No murmurs, rubs, or gallops.  ABDOMEN: Soft, nontender, nondistended. Bowel sounds present. No organomegaly or mass. Foley+  PEG+ EXTREMITIES: No pedal edema, cyanosis, or clubbing.  NEUROLOGIC: No obvious facial droop noted.Marland Kitchen able to track some, opening eyes to [facility]   Not following any commands.  Alert but non verbal.   Sensation intact. Gait not checked.   Has 2+ symmetric bilateral lower extremity reflexes PSYCHIATRIC: patient is alert but not oriented, remains nonverbal SKIN: No obvious rash, lesion, or ulcer--details per RN assessment   LABORATORY PANEL:    CBC No results for input(s): WBC, HGB, HCT, PLT in the last 168 hours. ------------------------------------------------------------------------------------------------------------------  Chemistries  No results for input(s): NA, K, CL, CO2, GLUCOSE, BUN, CREATININE, CALCIUM, MG, AST, ALT, ALKPHOS, BILITOT in the last 168 hours.  Invalid input(s): GFRCGP ------------------------------------------------------------------------------------------------------------------  Cardiac Enzymes No results for input(s): TROPONINI in the last 168 hours. ------------------------------------------------------------------------------------------------------------------  RADIOLOGY:  No results found.  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  1. Acute encephalopathy-suspected secondary to hypoglycemia when pt presented to the hospital -pt required mechanical ventilation -Has bilateral encephalomalacia based on CT head.  Prior to left frontal craniotomy done.  Prior history of strokes. -Has right anterior cerebral artery aneurysm embolization in the past and clipping recently this year. -MRI cannot be done due to recent clipping. -Appreciate neurology consult.  Provigil dose has been increased without significant change.  Remains alert but not following commands. -Very slow recovery expected at this point.   -S/P PEG placement on June 5th 2020-PEG feeding going well  2. Sepsis-secondary to urinary tract infection.  All cultures have been negative.  Off pressors.  Off steroids. --Finished broad-spectrum antibiotics  3.  Acute renal failure-secondary to acute urinary retention  with history of BPH -Appreciate nephrology consult.  Bilateral hydronephrosis on renal ultrasound. - Status post Foley cath -Renal function is normalized at this  time  4. Poor oral intake/Nutrition - now s/p PEG tube placement since mental status recovery will not be sooner according to neurology. - Patient had a G-tube and tracheostomy which was recently removed couple of months ago and patient was eating by mouth.  -PEG feeding going well  5.  Seizure prevention -prophylactically on Keppra.  6.  DVT prophylaxis-subcutaneous heparin  - Pt was denied LTAC services per CSW. He was from [facility]. CSW working with dter for SNF d/c planning. Pt at present baseline (best new) for d/c -Dr Posey Pronto had peer to peer review with Dr. Aris Georgia with patient's insurance company. To him reviewing at physical therapy notes prior to patient being admitted here he was not performing very well. Patient was wheelchair-bound as well. And given this hospitalization and current mental status he does not foresee patient improving with physical therapy. Patient is at goal rate for his peg feeding. He recommends long-term care. Unfortunately patient does not have long-term care plan under his insurance.  Follw up with Education officer, museum and family PT & OT recommend SNF/LTACH Social worker f/u  7.  Insurance authorization to SNF denied..  Follow-up with social worker again. Family updated.  8.  [facility]wait [facility] Will discuss with family again Left voice mail for family  CODE STATUS: Full code  TOTAL TIME TAKING CARE OF THIS PATIENT: 23 minutes.   POSSIBLE D/C IN ?  DAYS, DEPENDING ON CLINICAL CONDITION.   Saundra Shelling M.D on 11/15/2018 at 9:26 AM  Between 7am to 6pm - Pager - [phone]  After 6pm go to [email or web address] - password [email or web address] [place] [facility]  Office  [phone]  CC: Primary care physician; Garwin Brothers, MD

## 2018-11-15 NOTE — Progress Notes (Signed)
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 who presented from his facility unresponsive requiring him to be ventilated in the ED. Patient has been seen in previous years at behavioral health for suicide ideation.    OT comments  Pt sleeping, difficult to wake despite cues. BUE PROM performed. Bilat resting hand splints readjusted and skin checked for breakdown and optimal positioning. None noted. Optimal positioning achieved. Will continue to assess during future therapy sessions. Pt continues to benefit from skilled OT services.    Follow Up Recommendations  SNF    Equipment Recommendations  None recommended by OT    Recommendations for Other Services      Precautions / Restrictions Precautions Precautions: Fall Restrictions Weight Bearing Restrictions: No Other Position/Activity Restrictions: bilat resting hand splints - continue to assess for optimal positioning/fitting, skin integrity       Mobility Bed Mobility                  Transfers                      Balance                                           ADL either performed or assessed with clinical judgement   ADL                                         General ADL Comments: Pt. continues to require total assist all LB ADLs, max assist for grooming, Pt. continues to be NPO     Vision       Perception     Praxis      Cognition Arousal/Alertness: Lethargic Behavior During Therapy: Flat affect                                   General Comments: pt unable to keep eyes open during session        Exercises Other Exercises Other  Exercises: bilat resting hand splints ordered and on, displaced on RUE; both adjusted and skin checked for breakdown/redness (none noted), replaced with optimal positioning after BUE PROM   Shoulder Instructions       General Comments      Pertinent Vitals/ Pain       Pain Assessment: Faces Faces Pain Scale: No hurt Pain Intervention(s): Monitored during session  Home Living                                          Prior Functioning/Environment              Frequency  Min 1X/week        Progress Toward Goals  OT Goals(current goals can now be found in the care plan section)  Progress towards OT goals: OT to reassess next treatment  Acute Rehab OT Goals OT Goal Formulation: Patient unable to participate in goal setting  Plan Discharge plan remains appropriate    Co-evaluation                 AM-PAC OT "6 Clicks" Daily Activity     Outcome Measure   Help from another person eating meals?: Total Help from another person taking care of personal grooming?: A Lot Help from another person toileting, which includes using toliet, bedpan, or urinal?: Total Help from another person bathing (including washing, rinsing, drying)?: Total Help from another person to put on and taking off regular upper body clothing?: Total Help from another person to put on and taking off regular lower body clothing?: Total 6 Click Score: 7    End of Session    OT Visit Diagnosis: Other abnormalities of gait and mobility (R26.89);Cognitive communication deficit (R41.841);Muscle weakness (generalized) (M62.81)   Activity Tolerance Patient tolerated treatment well;Patient limited by fatigue   Patient Left in bed;with call bell/phone within reach;with bed alarm set   Nurse Communication          Time: 5697-9480 OT Time Calculation (min): 10 min  Charges: OT General Charges $OT Visit: 1 Visit OT Treatments $Therapeutic Exercise: 8-22 mins  Jeni Salles,  MPH, MS, OTR/L ascom [phone] 11/15/18, 4:16 PM

## 2018-11-16 LAB — GLUCOSE, CAPILLARY
Glucose-Capillary: 132 mg/dL — ABNORMAL HIGH (ref 70–99)
Glucose-Capillary: 136 mg/dL — ABNORMAL HIGH (ref 70–99)
Glucose-Capillary: 140 mg/dL — ABNORMAL HIGH (ref 70–99)

## 2018-11-16 MED ORDER — FINASTERIDE 5 MG PO TABS
5.0000 mg | ORAL_TABLET | Freq: Every day | ORAL | Status: DC
  Administered 2018-11-16: 12:00:00 5 mg via ORAL
  Filled 2018-11-16: qty 1

## 2018-11-16 MED ORDER — FINASTERIDE 5 MG PO TABS
5.0000 mg | ORAL_TABLET | Freq: Every day | ORAL | Status: DC
  Administered 2018-11-18 – 2018-11-27 (×11): 5 mg via ORAL
  Filled 2018-11-16 (×12): qty 1

## 2018-11-16 MED ORDER — ATORVASTATIN CALCIUM 20 MG PO TABS
40.0000 mg | ORAL_TABLET | Freq: Every evening | ORAL | Status: DC
  Administered 2018-11-16 – 2018-11-28 (×13): 40 mg
  Filled 2018-11-16 (×13): qty 2

## 2018-11-16 NOTE — Progress Notes (Signed)
[place] [facility] [place] NAME: [name]    MR#:  [id number]  DATE OF BIRTH:  December 10, 1955  SUBJECTIVE:   -pt opens eyes on verbal commands. Tracks with eyes in the room. Non verbal -s/p PEG tube placement No new issues over night  s/p PT and OT evaluation Awake, non verbal Has bilateral hand splints obtained from [facility] after recommendation by OT  Occupational Therapy f/u done No new overnight events REVIEW OF SYSTEMS:  Review of Systems  Unable to perform ROS: Mental status change    DRUG ALLERGIES:   Allergies  Allergen Reactions   Metformin Diarrhea    VITALS:  Blood pressure 129/73, pulse 91, temperature 98.8 F (37.1 C), resp. rate 20, height 6' (1.829 m), weight 99.3 kg, SpO2 97 %.  PHYSICAL EXAMINATION:  Physical Exam   GENERAL:  63 y.o.-year-old chronically ill-appearing patient lying in the bed with no acute distress. EYES: Pupils equal, round, reactive to light and accommodation. No scleral icterus.  HEENT: Head atraumatic, normocephalic. Oropharynx and nasopharynx clear.  NECK:  Supple, no jugular venous distention. No thyroid enlargement, no tenderness.  LUNGS: Normal breath sounds bilaterally, no wheezing, rales,rhonchi or crepitation. No use of accessory muscles of respiration.  CARDIOVASCULAR: S1, S2 normal. No murmurs, rubs, or gallops.  ABDOMEN: Soft, nontender, nondistended. Bowel sounds present. No organomegaly or mass. Foley+  PEG+ EXTREMITIES: No pedal edema, cyanosis, or clubbing.  NEUROLOGIC: No obvious facial droop noted.Marland Kitchen able to track some, opening eyes to [facility]   Not following any commands.  Alert but non verbal.   Sensation intact. Gait not checked.   Has 2+ symmetric bilateral lower extremity reflexes PSYCHIATRIC: patient is alert but not oriented, remains nonverbal SKIN: No obvious rash, lesion, or ulcer--details per RN assessment   LABORATORY PANEL:   CBC No results for input(s): WBC,  HGB, HCT, PLT in the last 168 hours. ------------------------------------------------------------------------------------------------------------------  Chemistries  No results for input(s): NA, K, CL, CO2, GLUCOSE, BUN, CREATININE, CALCIUM, MG, AST, ALT, ALKPHOS, BILITOT in the last 168 hours.  Invalid input(s): GFRCGP ------------------------------------------------------------------------------------------------------------------  Cardiac Enzymes No results for input(s): TROPONINI in the last 168 hours. ------------------------------------------------------------------------------------------------------------------  RADIOLOGY:  No results found.  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  1. Acute encephalopathy-suspected secondary to hypoglycemia when pt presented to the hospital -pt required mechanical ventilation -Has bilateral encephalomalacia based on CT head.  Prior to left frontal craniotomy done.  Prior history of strokes. -Has right anterior cerebral artery aneurysm embolization in the past and clipping recently this year. -MRI cannot be done due to recent clipping. -Appreciate neurology consult.  Provigil dose has been increased without significant change.  Remains alert but not following commands. -Very slow recovery expected at this point.   -S/P PEG placement on June 5th 2020-PEG feeding going well  2. Sepsis-secondary to urinary tract infection.  All cultures have been negative.  Off pressors.  Off steroids. --Finished broad-spectrum antibiotics  3.  Acute renal failure-secondary to acute urinary retention with history of BPH -Appreciate  nephrology consult.  Bilateral hydronephrosis on renal ultrasound. - Status post Foley cath -Renal function is normalized at this time  4.  Poor oral intake/Nutrition - now s/p PEG tube placement since mental status recovery will not be sooner according to neurology. - Patient had a G-tube and tracheostomy which was recently removed couple of months ago and patient was eating by mouth.  -PEG feeding going well  5.  Seizure prevention -prophylactically on Keppra.  6.  DVT prophylaxis-subcutaneous lovenox  - Pt was denied LTAC services per CSW. He was from [facility]. CSW working with dter for SNF d/c planning. Pt at present baseline (best new) for d/c -Dr Posey Pronto had peer to peer review with Dr. Aris Georgia with patient's insurance company. To him reviewing at physical therapy notes prior to patient being admitted here he was not performing very well. Patient was wheelchair-bound as well. And given this hospitalization and current mental status he does not foresee patient improving with physical therapy. Patient is at goal rate for his peg feeding. He recommends long-term care. Unfortunately patient does not have long-term care plan under his insurance.  Follw up with Education officer, museum and family PT & OT recommend SNF/LTACH Social worker f/u  7.  Insurance authorization to SNF denied..  Follow-up with social worker again.  8.  [facility] Will discuss with family again Left voice mail for family  CODE STATUS: Full code  TOTAL TIME TAKING CARE OF THIS PATIENT: 23 minutes.   POSSIBLE D/C IN ?  DAYS, DEPENDING ON CLINICAL CONDITION.   Vaughan Basta M.D on 11/16/2018 at 3:52 PM  Between 7am to 6pm - Pager - [phone]  After 6pm go to [email or web address] - password [email or web address] [facility]  Office  [phone]  CC: Primary care physician; Garwin Brothers, MD

## 2018-11-17 LAB — CBC
HCT: 30.7 % — ABNORMAL LOW (ref 39.0–52.0)
Hemoglobin: 9.5 g/dL — ABNORMAL LOW (ref 13.0–17.0)
MCH: 28.2 pg (ref 26.0–34.0)
MCHC: 30.9 g/dL (ref 30.0–36.0)
MCV: 91.1 fL (ref 80.0–100.0)
Platelets: 410 10*3/uL — ABNORMAL HIGH (ref 150–400)
RBC: 3.37 MIL/uL — ABNORMAL LOW (ref 4.22–5.81)
RDW: 18.6 % — ABNORMAL HIGH (ref 11.5–15.5)
WBC: 12.2 10*3/uL — ABNORMAL HIGH (ref 4.0–10.5)
nRBC: 0 % (ref 0.0–0.2)

## 2018-11-17 LAB — BASIC METABOLIC PANEL
Anion gap: 8 (ref 5–15)
BUN: 22 mg/dL (ref 8–23)
CO2: 27 mmol/L (ref 22–32)
Calcium: 8.7 mg/dL — ABNORMAL LOW (ref 8.9–10.3)
Chloride: 105 mmol/L (ref 98–111)
Creatinine, Ser: 0.58 mg/dL — ABNORMAL LOW (ref 0.61–1.24)
GFR calc Af Amer: >60 mL/min (ref >60)
GFR calc non Af Amer: >60 mL/min (ref >60)
Glucose, Bld: 153 mg/dL — ABNORMAL HIGH (ref 70–99)
Potassium: 4.1 mmol/L (ref 3.5–5.1)
Sodium: 140 mmol/L (ref 135–145)

## 2018-11-17 MED ORDER — PRO-STAT SUGAR FREE PO LIQD
30.0000 mL | Freq: Every day | ORAL | Status: DC
  Administered 2018-11-18 – 2018-11-24 (×7): 30 mL

## 2018-11-17 MED ORDER — JUVEN PO PACK
1.0000 | PACK | Freq: Two times a day (BID) | ORAL | Status: DC
  Administered 2018-11-18 – 2018-11-28 (×23): 1

## 2018-11-17 NOTE — Progress Notes (Signed)
[name] NAME: Atharv Barriere    MR#:  [id number]  DATE OF BIRTH:  July 09, 1955  SUBJECTIVE:   -pt opens eyes on verbal commands. Tracks with eyes in the room. Non verbal -s/p PEG tube placement No new issues over night  s/p PT and OT evaluation Awake, non verbal Has bilateral hand splints obtained from [facility] after recommendation by OT  No new overnight events REVIEW OF SYSTEMS:  Review of Systems  Unable to perform ROS: Mental status change    DRUG ALLERGIES:   Allergies  Allergen Reactions  . Metformin Diarrhea    VITALS:  Blood pressure 118/69, pulse 91, temperature 98.7 F (37.1 C), temperature source Axillary, resp. rate (!) 24, height 6' (1.829 m), weight 98.4 kg, SpO2 97 %.  PHYSICAL EXAMINATION:  Physical Exam   GENERAL:  63 y.o.-year-old chronically ill-appearing patient lying in the bed with no acute distress. EYES: Pupils equal, round, reactive to light and accommodation. No scleral icterus.  HEENT: Head atraumatic, normocephalic. Oropharynx and nasopharynx clear.  NECK:  Supple, no jugular venous distention. No thyroid enlargement, no tenderness.  LUNGS: Normal breath sounds bilaterally, no wheezing, rales,rhonchi or crepitation. No use of accessory muscles of respiration.  CARDIOVASCULAR: S1, S2 normal. No murmurs, rubs, or gallops.  ABDOMEN: Soft, nontender, nondistended. Bowel sounds present. No organomegaly or mass. Foley+  PEG+ EXTREMITIES: No pedal edema, cyanosis, or clubbing.  NEUROLOGIC: No obvious facial droop noted.[name] able to track some, opening eyes to [facility]   Not following any commands.  Alert but non verbal.   Sensation intact. Gait not checked.   Has 2+ symmetric bilateral lower extremity reflexes PSYCHIATRIC: patient is alert but not oriented, remains nonverbal SKIN: No obvious rash, lesion, or ulcer--details per RN assessment   LABORATORY PANEL:   CBC Recent Labs  Lab 11/17/18  0553  WBC 12.2*  HGB 9.5*  HCT 30.7*  PLT 410*   ------------------------------------------------------------------------------------------------------------------  Chemistries  Recent Labs  Lab 11/17/18 0553  NA 140  K 4.1  CL 105  CO2 27  GLUCOSE 153*  BUN 22  CREATININE 0.58*  CALCIUM 8.7*   ------------------------------------------------------------------------------------------------------------------  Cardiac Enzymes No results for input(s): TROPONINI in the last 168 hours. ------------------------------------------------------------------------------------------------------------------  RADIOLOGY:  No results found.  EKG:   Orders placed or performed in visit on 04/13/17  . 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  1. Acute encephalopathy-suspected secondary to hypoglycemia when pt presented to the hospital -pt required mechanical ventilation -Has bilateral encephalomalacia based on CT head.  Prior to left frontal craniotomy done.  Prior history of strokes. -Has right anterior cerebral artery aneurysm embolization in the past and clipping recently this year. -MRI cannot be done due to recent clipping. -Appreciate neurology consult.  Provigil dose has been increased without significant change.  Remains alert but not following commands. -Very slow recovery expected at this point.   -S/P PEG placement on June 5th 2020-PEG feeding going well  2. Sepsis-secondary to urinary tract infection.  All cultures have been negative.  Off pressors.  Off steroids. --Finished broad-spectrum antibiotics  3.  Acute renal failure-secondary to acute urinary retention with history of BPH -Appreciate nephrology  consult.  Bilateral hydronephrosis on renal ultrasound. -  Status post Foley cath -Renal function is normalized at this time  4. Poor oral intake/Nutrition - now s/p PEG tube placement since mental status recovery will not be sooner according to neurology. - Patient had a G-tube and tracheostomy which was recently removed couple of months ago and patient was eating by mouth.  -PEG feeding going well  5.  Seizure prevention -prophylactically on Keppra.  6.  DVT prophylaxis-subcutaneous lovenox  - Pt was denied LTAC services per CSW. He was from [facility]. CSW working with dter for SNF d/c planning. Pt at present baseline (best new) for d/c -Dr Posey Pronto had peer to peer review with Dr. Aris Georgia with patient's insurance company. To him reviewing at physical therapy notes prior to patient being admitted here he was not performing very well. Patient was wheelchair-bound as well. And given this hospitalization and current mental status he does not foresee patient improving with physical therapy. Patient is at goal rate for his peg feeding. He recommends long-term care. Unfortunately patient does not have long-term care plan under his insurance.  Follw up with Education officer, museum and family PT & OT recommend SNF/LTACH Social worker f/u  7.  Insurance authorization to SNF denied..  Follow-up with social worker again.  8.  [facility]wait [facility] Awaited placement.   CODE STATUS: Full code  TOTAL TIME TAKING CARE OF THIS PATIENT: 23 minutes.   POSSIBLE D/C IN ?  DAYS, DEPENDING ON CLINICAL CONDITION.   Vaughan Basta M.D on 11/17/2018 at 2:55 PM  Between 7am to 6pm - Pager - [phone]  After 6pm go to [email or web address] - password [email or web address] [facility]  Office  [phone]  CC: Primary care physician; Garwin Brothers, MD

## 2018-11-18 ENCOUNTER — Inpatient Hospital Stay: Payer: Medicare HMO

## 2018-11-18 NOTE — Progress Notes (Signed)
OT Cancellation Note  Patient Details Name: [name] MRN: [id number] DOB: [birth year]   Cancelled Treatment:    Reason Eval/Treat Not Completed: Patient at procedure or test/ unavailable. Pt out for testing. Will re-attempt OT tx at later date/time as pt is available.  Jeni Salles, MPH, MS, OTR/L ascom [phone] 11/18/18, 1:02 PM

## 2018-11-18 NOTE — Progress Notes (Signed)
Nutrition Follow-up  DOCUMENTATION CODES:   Obesity unspecified  INTERVENTION:  Continue Osmolite 1.5 Cal at goal rate of 60 mL/hr + Pro-Stat 30 mL BID per tube. Provides 2360 kcal, 120 grams of protein, 1094 mL H2O daily.  Continuefree water flush of150 mL Q4hrs. This provides a total of 1994 mL H2O daily including water in tube feeding.  Goal regimen meets 100% RDIs for vitamins/minerals.  Continue Juven BID per tube to promote wound healing.  NUTRITION DIAGNOSIS:   Inadequate oral intake related to inability to eat as evidenced by NPO status.  Ongoing - addressing with TF regimen.  GOAL:   Patient will meet greater than or equal to 90% of their needs  Met with TF regimen.  MONITOR:   Diet advancement, Labs, Weight trends, I & O's, Skin  REASON FOR ASSESSMENT:   Consult Enteral/tube feeding initiation and management  ASSESSMENT:   63 year old male with PMHx of HTN, HLD, depression, anxiety, hx CVA, hx drug abuse admitted with acute mental status change in setting of prior cerebral hemorrhage and CVA and requiring intubation on 5/23 for airway protection, also with urinary retention with acute renal failure.  Met with patient in room but he was unable to provide any history. Tube feeds infusing at goal rate. Discussed with RN and patient continues to tolerate tube feeds. Pro-Stat and Juven were discontinued over the weekend for some reason and cannot see why in chart. Also not passed off on report per RN. RD reordered Pro-Stat and Juven. Still pending placement.  Enteral Access: 20 Fr. Bard gastrostomy tube placed 6/5 by GI; external bumper at 4.5 cm marking  TF: Osmolite at 60 mL/hr + Pro-Stat 30 mL BID + FWF 150 mL Q4hrs  Medications reviewed and include: atorvastatin, carvedilol, famotidine, Keppra.  Labs reviewed: CBG 132-148, Creatinine 0.58.  I/O: 1150 mL UOP yesterday (0.5 mL/kg/hr)  Weight trend: 98.4 kg on 6/21; -2.3 kg from 6/15  Diet Order:    Diet Order    None     EDUCATION NEEDS:   No education needs have been identified at this time  Skin:  Skin Assessment: Skin Integrity Issues:(MSAD to buttocks)  Last BM:  11/16/2018 per chart  Height:   Ht Readings from Last 1 Encounters:  11/01/18 6' (1.829 m)   Weight:   Wt Readings from Last 1 Encounters:  11/17/18 98.4 kg   Ideal Body Weight:  80.9 kg  BMI:  Body mass index is 29.42 kg/m.  Estimated Nutritional Needs:   Kcal:  2300-2600kcal/day   Protein:  115-130g/day   Fluid:  2 L/day (25 mL/kg IBW)  Willey Blade, MS, RD, LDN Office: [phone] Pager: [phone] After Hours/Weekend Pager: [phone]

## 2018-11-18 NOTE — Progress Notes (Signed)
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 who presented from his facility unresponsive requiring him to be ventilated in the ED. Patient has been seen in previous years at behavioral health for suicide ideation.     PT Comments    Participated in exercises as described below.  Pt asleep at beginning of session but awoke somewhat after ROM.  Positioned for comfort with pillows at end of bed to support feet and prevent footdrop.    Follow Up Recommendations  SNF;LTACH     Equipment Recommendations  None recommended by PT    Recommendations for Other Services       Precautions / Restrictions Precautions Precautions: Fall Restrictions Weight Bearing Restrictions: No    Mobility  Bed Mobility Overal bed mobility: Needs Assistance             General bed mobility comments: deferred due to patient continuously falling asleep  Transfers                    Ambulation/Gait             General Gait Details: unable   Stairs             Wheelchair Mobility    Modified Rankin (Stroke Patients Only)       Balance Overall balance assessment: Needs assistance Sitting-balance support: Feet unsupported Sitting balance-Leahy Scale: Zero Sitting balance - Comments: deferred     Standing balance-Leahy Scale: Zero                              Cognition Arousal/Alertness: Lethargic Behavior During Therapy: Flat affect Overall Cognitive Status: Difficult to assess                                        Exercises Other Exercises Other Exercises: BLE PROM for available ranges in supine. Other Exercises: prolonged hold for  attempt to obtain neutral ankle position of LLE.    General Comments        Pertinent Vitals/Pain Pain Assessment: Faces Faces Pain Scale: Hurts a little bit Pain Location: Grimmaces at times at end ranges with BLE PROM Pain Descriptors / Indicators: Grimacing Pain Intervention(s): Monitored during session    Home Living                      Prior Function            PT Goals (current goals can now be found in the care plan section) Progress towards PT goals: Not progressing toward goals - comment    Frequency    Min 2X/week      PT Plan Current plan remains appropriate    Co-evaluation              AM-PAC PT "6 Clicks" Mobility   Outcome Measure  Help needed turning from your back to your side while in a flat bed without using bedrails?: Total Help needed moving from lying on your back to sitting on the side of a flat bed without using bedrails?: Total Help  needed moving to and from a bed to a chair (including a wheelchair)?: Total Help needed standing up from a chair using your arms (e.g., wheelchair or bedside chair)?: Total Help needed to walk in hospital room?: Total Help needed climbing 3-5 steps with a railing? : Total 6 Click Score: 6    End of Session   Activity Tolerance: Patient tolerated treatment well Patient left: in bed;with call bell/phone within reach;with bed alarm set         Time: 1049-1100 PT Time Calculation (min) (ACUTE ONLY): 11 min  Charges:  $Therapeutic Exercise: 8-22 mins                     Chesley Noon, PTA 11/18/18, 11:25 AM

## 2018-11-18 NOTE — Progress Notes (Addendum)
Subjective:  Pt doesn't follow commands. Somnolent.   Past Medical History:  Diagnosis Date  . Allergy   . Anxiety   . Cataract   . Cerebral hemorrhage ([place])   . Chest pain, atypical    12/2003:  negative cardiolyte  . Depression   . Drug abuse ([place])   . Eczema   . Erectile dysfunction   . History of meniscal tear    bilateral  . HLD (hyperlipidemia)   . HTN (hypertension)   . Hx of tear of ACL (anterior cruciate ligament)    right  . Insomnia   . Olecranon bursitis of left elbow 10/2009   s/p I&D by Dr Maxie Better, initially assessed by Dr. Nori Riis   . Prediabetes   . Stroke ([place])   . Suicidal behavior 04/13/2017   Pt states taking a bunch of sleeping pills to end life   . TIA (transient ischemic attack)     Past Surgical History:  Procedure Laterality Date  . EP IMPLANTABLE DEVICE N/A 12/30/2015   Procedure: Loop Recorder Insertion;  Surgeon: Thompson Grayer, MD;  Location: [place] [facility];  Service: Cardiovascular;  Laterality: N/A;  . ESOPHAGOGASTRODUODENOSCOPY N/A 10/03/2016   Procedure: ESOPHAGOGASTRODUODENOSCOPY (EGD);  Surgeon: Irene Shipper, MD;  Location: [facility] ENDOSCOPY;  Service: Endoscopy;  Laterality: N/A;  . INCISE AND DRAIN ABCESS     L elbow due to cellulitis/bursitis  . INNER EAR SURGERY    . IR GENERIC HISTORICAL  12/28/2015   IR ANGIO VERTEBRAL SEL VERTEBRAL UNI L MOD SED 12/28/2015 Luanne Bras, MD [facility]  . IR GENERIC HISTORICAL  12/28/2015   IR ANGIO VERTEBRAL SEL SUBCLAVIAN INNOMINATE UNI R MOD SED 12/28/2015 Luanne Bras, MD [facility]  . IR GENERIC HISTORICAL  12/28/2015   IR ANGIO INTRA EXTRACRAN SEL INTERNAL CAROTID BILAT MOD SED 12/28/2015 Luanne Bras, MD [facility]  . KNEE ARTHROSCOPY    . PEG PLACEMENT N/A 11/01/2018   Procedure: PERCUTANEOUS ENDOSCOPIC GASTROSTOMY (PEG) PLACEMENT;  Surgeon: Lin Landsman, MD;  Location: [facility] ENDOSCOPY;  Service: Gastroenterology;  Laterality: N/A;  . RADIOLOGY WITH ANESTHESIA N/A 09/20/2015    Procedure: EMBOLIZATION         (RADIOLOGY WITH ANESTHESIA);  Surgeon: Luanne Bras, MD;  Location: [place];  Service: Radiology;  Laterality: N/A;  . SKIN TAG REMOVAL     11 removed  . TEE WITHOUT CARDIOVERSION N/A 12/30/2015   Procedure: TRANSESOPHAGEAL ECHOCARDIOGRAM (TEE);  Surgeon: Larey Dresser, MD;  Location: [facility] ENDOSCOPY;  Service: Cardiovascular;  Laterality: N/A;    Family History  Problem Relation Age of Onset  . Stroke Mother   . Hypertension Mother   . Aneurysm Mother 41       Died of brain aneursym  . Heart failure Father   . Emphysema Father   . Diabetes Mellitus II Sister   . Colon cancer Neg Hx   . Rectal cancer Neg Hx   . Stomach cancer Neg Hx     Social History:  reports that he has never smoked. He has never used smokeless tobacco. He reports that he does not drink alcohol or use drugs.  Allergies  Allergen Reactions  . Metformin Diarrhea    Medications: I have reviewed the patient's current medications.  ROS: Unable to determine as not following commands.   Physical Examination: Blood pressure (!) 151/76, pulse 88, temperature 99.5 F (37.5 C), temperature source Axillary, resp. rate 20, height 6' (1.829 m), weight 98.4 kg, SpO2 95 %.  Neurological  Examination   Mental Status: [place] doesn't follow  Cranial Nerves: II: Discs flat bilaterally;  V,VII: smile symmetric VIII: hearing normal bilaterally IX,X: gag reflex present XI: bilateral shoulder shrug XII: midline tongue extension Motor: Moves all extremities symmetrically   Laboratory Studies:   Basic Metabolic Panel: Recent Labs  Lab 11/17/18 0553  NA 140  K 4.1  CL 105  CO2 27  GLUCOSE 153*  BUN 22  CREATININE 0.58*  CALCIUM 8.7*    Liver Function Tests: No results for input(s): AST, ALT, ALKPHOS, BILITOT, PROT, ALBUMIN in the last 168 hours. No results for input(s): LIPASE, AMYLASE in the last 168 hours. No results for input(s): AMMONIA in the last 168  hours.  CBC: Recent Labs  Lab 11/17/18 0553  WBC 12.2*  HGB 9.5*  HCT 30.7*  MCV 91.1  PLT 410*    Cardiac Enzymes: No results for input(s): CKTOTAL, CKMB, CKMBINDEX, TROPONINI in the last 168 hours.  BNP: Invalid input(s): POCBNP  CBG: Recent Labs  Lab 11/15/18 1637 11/15/18 2033 11/16/18 0014 11/16/18 0448 11/16/18 0729  GLUCAP 146* 148* 136* 132* 140*    Microbiology: Results for orders placed or performed during the hospital encounter of 10/19/18  SARS Coronavirus 2 (CEPHEID - Performed in [place] hospital lab), Hosp Order     Status: None   Collection Time: 10/19/18  9:28 AM   Specimen: Nasopharyngeal Swab  Result Value Ref Range Status   SARS Coronavirus 2 NEGATIVE NEGATIVE Final    Comment: (NOTE) If result is NEGATIVE SARS-CoV-2 target nucleic acids are NOT DETECTED. The SARS-CoV-2 RNA is generally detectable in upper and lower  respiratory specimens during the acute phase of infection. The lowest  concentration of SARS-CoV-2 viral copies this assay can detect is 250  copies / mL. A negative result does not preclude SARS-CoV-2 infection  and should not be used as the sole basis for treatment or other  patient management decisions.  A negative result may occur with  improper specimen collection / handling, submission of specimen other  than nasopharyngeal swab, presence of viral mutation(s) within the  areas targeted by this assay, and inadequate number of viral copies  (<250 copies / mL). A negative result must be combined with clinical  observations, patient history, and epidemiological information. If result is POSITIVE SARS-CoV-2 target nucleic acids are DETECTED. The SARS-CoV-2 RNA is generally detectable in upper and lower  respiratory specimens dur ing the acute phase of infection.  Positive  results are indicative of active infection with SARS-CoV-2.  Clinical  correlation with patient history and other diagnostic information is  necessary  to determine patient infection status.  Positive results do  not rule out bacterial infection or co-infection with other viruses. If result is PRESUMPTIVE POSTIVE SARS-CoV-2 nucleic acids MAY BE PRESENT.   A presumptive positive result was obtained on the submitted specimen  and confirmed on repeat testing.  While 2019 novel coronavirus  (SARS-CoV-2) nucleic acids may be present in the submitted sample  additional confirmatory testing may be necessary for epidemiological  and / or clinical management purposes  to differentiate between  SARS-CoV-2 and other Sarbecovirus currently known to infect humans.  If clinically indicated additional testing with an alternate test  methodology [phone]) is advised. The SARS-CoV-2 RNA is generally  detectable in upper and lower respiratory sp ecimens during the acute  phase of infection. The expected result is Negative. Fact Sheet for Patients:  StrictlyIdeas.no Fact Sheet for Healthcare Providers: BankingDealers.co.za This test is not  yet approved or cleared by the [place] and has been authorized for detection and/or diagnosis of SARS-CoV-2 by FDA under an Emergency Use Authorization (EUA).  This EUA will remain in effect (meaning this test can be used) for the duration of the COVID-19 declaration under Section 564(b)(1) of the Act, 21 U.S.C. section 360bbb-3(b)(1), unless the authorization is terminated or revoked sooner. Performed at [facility], [address] [place], [place] [address]   Blood culture (routine x 2)     Status: None   Collection Time: 10/19/18  9:36 AM   Specimen: BLOOD  Result Value Ref Range Status   Specimen Description BLOOD LUA  Final   Special Requests   Final    BOTTLES DRAWN AEROBIC AND ANAEROBIC Blood Culture adequate volume   Culture   Final    NO GROWTH 5 DAYS Performed at [facility], [address] [place], [place] [address]     Report Status 10/24/2018 FINAL  Final  Blood culture (routine x 2)     Status: None   Collection Time: 10/19/18 10:16 AM   Specimen: BLOOD  Result Value Ref Range Status   Specimen Description BLOOD LFA  Final   Special Requests   Final    BOTTLES DRAWN AEROBIC AND ANAEROBIC Blood Culture results may not be optimal due to an inadequate volume of blood received in culture bottles   Culture   Final    NO GROWTH 5 DAYS Performed at [facility], [address] [place], [place] [address]    Report Status 10/24/2018 FINAL  Final  MRSA PCR Screening     Status: Abnormal   Collection Time: 10/19/18  1:06 PM   Specimen: Nasopharyngeal  Result Value Ref Range Status   MRSA by PCR POSITIVE (A) NEGATIVE Final    Comment:        The GeneXpert MRSA Assay (FDA approved for NASAL specimens only), is one component of a comprehensive MRSA colonization surveillance program. It is not intended to diagnose MRSA infection nor to guide or monitor treatment for MRSA infections. CRITICAL RESULT CALLED TO, READ BACK BY AND VERIFIED WITH: CALLED TO TANYA SILVA @1426  10/19/2018 [facility] Performed at [facility], [address] [place], [place] [address]   Urine Culture     Status: None   Collection Time: 10/21/18  4:10 PM   Specimen: Urine, Catheterized  Result Value Ref Range Status   Specimen Description   Final    URINE, CATHETERIZED Performed at [facility], [address] [place], [place] [address]    Special Requests   Final    Normal Performed at [facility], [address] [place], [place] [address]    Culture   Final    NO GROWTH Performed at [facility], [place] [address], [place], [place] [address]    Report Status 10/22/2018 FINAL  Final  SARS Coronavirus 2 [facility] order, Performed in [place] hospital lab)     Status: None   Collection Time: 11/07/18 10:05 AM   Specimen: Nasopharyngeal Swab  Result Value Ref Range Status   SARS  Coronavirus 2 NEGATIVE NEGATIVE Final    Comment: (NOTE) If result is NEGATIVE SARS-CoV-2 target nucleic acids are NOT DETECTED. The SARS-CoV-2 RNA is generally detectable in upper and lower  respiratory specimens during the acute phase of infection. The lowest  concentration of SARS-CoV-2 viral copies this assay can detect is 250  copies / mL. A negative result  does not preclude SARS-CoV-2 infection  and should not be used as the sole basis for treatment or other  patient management decisions.  A negative result may occur with  improper specimen collection / handling, submission of specimen other  than nasopharyngeal swab, presence of viral mutation(s) within the  areas targeted by this assay, and inadequate number of viral copies  (<250 copies / mL). A negative result must be combined with clinical  observations, patient history, and epidemiological information. If result is POSITIVE SARS-CoV-2 target nucleic acids are DETECTED. The SARS-CoV-2 RNA is generally detectable in upper and lower  respiratory specimens dur ing the acute phase of infection.  Positive  results are indicative of active infection with SARS-CoV-2.  Clinical  correlation with patient history and other diagnostic information is  necessary to determine patient infection status.  Positive results do  not rule out bacterial infection or co-infection with other viruses. If result is PRESUMPTIVE POSTIVE SARS-CoV-2 nucleic acids MAY BE PRESENT.   A presumptive positive result was obtained on the submitted specimen  and confirmed on repeat testing.  While 2019 novel coronavirus  (SARS-CoV-2) nucleic acids may be present in the submitted sample  additional confirmatory testing may be necessary for epidemiological  and / or clinical management purposes  to differentiate between  SARS-CoV-2 and other Sarbecovirus currently known to infect humans.  If clinically indicated additional testing with an alternate test   methodology [phone]) is advised. The SARS-CoV-2 RNA is generally  detectable in upper and lower respiratory sp ecimens during the acute  phase of infection. The expected result is Negative. Fact Sheet for Patients:  StrictlyIdeas.no Fact Sheet for Healthcare Providers: BankingDealers.co.za This test is not yet approved or cleared by the [place] FDA and has been authorized for detection and/or diagnosis of SARS-CoV-2 by FDA under an Emergency Use Authorization (EUA).  This EUA will remain in effect (meaning this test can be used) for the duration of the COVID-19 declaration under Section 564(b)(1) of the Act, 21 U.S.C. section 360bbb-3(b)(1), unless the authorization is terminated or revoked sooner. Performed at [facility], [address] [place], [place] [address]     Coagulation Studies: No results for input(s): LABPROT, INR in the last 72 hours.  Urinalysis: No results for input(s): COLORURINE, LABSPEC, PHURINE, GLUCOSEU, HGBUR, BILIRUBINUR, KETONESUR, PROTEINUR, UROBILINOGEN, NITRITE, LEUKOCYTESUR in the last 168 hours.  Invalid input(s): APPERANCEUR  Lipid Panel:     Component Value Date/Time   CHOL 124 04/14/2017 0328   TRIG 107 04/14/2017 0328   HDL 29 (L) 04/14/2017 0328   CHOLHDL 4.3 04/14/2017 0328   VLDL 21 04/14/2017 0328   LDLCALC 74 04/14/2017 0328    HgbA1C:  Lab Results  Component Value Date   HGBA1C 5.9 (H) 10/29/2018    Urine Drug Screen:      Component Value Date/Time   LABOPIA NONE DETECTED 04/08/2017 1718   COCAINSCRNUR NONE DETECTED 04/08/2017 1718   COCAINSCRNUR NEG 06/07/2006 0039   LABBENZ POSITIVE (A) 04/08/2017 1718   LABBENZ NEG 06/07/2006 0039   AMPHETMU NONE DETECTED 04/08/2017 1718   THCU NONE DETECTED 04/08/2017 1718   LABBARB NONE DETECTED 04/08/2017 1718    Alcohol Level: No results for input(s): ETH in the last 168 hours.   Imaging: No results  found.   Assessment/Plan: 63 y.o. male with a known history of cerebral hemorrhage s/p aneurismal coil, acute hypoxic respiratory failure was admitted at [facility] this year underwenthead tracheostomy, history of CVA, hypertension, depression and history of  drug abuse comes to the emergency room after he was found hypoglycemic. Pt has been at [facility] for a month. No significant clinical improvement seen  - Unable to obtain MRI due to clipping - High dose provigil which has not helped - repeat CTH now as last one about 3 weeks ago - this is likely his new baseline and will not improve - will d/w daughter after imaging is obtained  Addendum:  - CTH done not showing acute abnormalities  - I do suspect there might be some subcortical damage from hypoglycemia that is not seen on the [facility]. Again unable to obtain MRI for above reasons - This is potentially permanent damage given the time frame - Long discussion with daughter Ms. Twilley and current condition explained - She wants to keep patient full code at this time.  Leotis Pain

## 2018-11-18 NOTE — Plan of Care (Signed)

## 2018-11-18 NOTE — TOC Progression Note (Signed)
Transition of Care [facility]) - Progression Note    Patient Details  Name: [name] MRN: [id number] Date of Birth: [birth year]  [facility] of [facility] CM/SW Contact  Shelbie Hutching, RN Phone Number: 11/18/2018, 2:09 PM  Clinical Narrative:    RNCM reached out to daughter [name] this morning for update on discharge plan.  [name] still would like for the patient to be transferred to [facility].  Neuro re-consulted and ordered head CT.  Neurology does not see any need for the patient to be transferred, once the CT results come back neurology will call and speak with the daughter.     Expected Discharge Plan: [place] Barriers to Discharge: No SNF bed, SNF Authorization Denied, Barriers Unresolved (comment)(Family plan to private pay for [facility]- no beds available until 6/18)  Expected Discharge Plan and Services Expected Discharge Plan: [place]                                               Social Determinants of Health (SDOH) Interventions    Readmission Risk Interventions Readmission Risk Prevention Plan 11/08/2018  Post Dischage Appt Complete  Medication Screening Complete  Transportation Screening Complete  Some recent data might be hidden

## 2018-11-18 NOTE — Plan of Care (Signed)
  Problem: Education: Goal: Knowledge of General Education information will improve Description Including pain rating scale, medication(s)/side effects and non-pharmacologic comfort measures Outcome: Progressing   

## 2018-11-18 NOTE — Progress Notes (Signed)
[place] at [place] NAME: [name]    MR#:  [id number]  DATE OF BIRTH:  [birth year]  SUBJECTIVE:   -pt opens eyes on verbal commands. Tracks with eyes in the room. Non verbal -s/p PEG tube placement No new issues over night  s/p PT and OT evaluation Awake, non verbal Has bilateral hand splints obtained from [facility] after recommendation by OT  No new overnight events. REVIEW OF SYSTEMS:  Review of Systems  Unable to perform ROS: Mental status change    DRUG ALLERGIES:   Allergies  Allergen Reactions  . Metformin Diarrhea    VITALS:  Blood pressure (!) 151/76, pulse 88, temperature 99.5 F (37.5 C), temperature source Axillary, resp. rate 20, height 6' (1.829 m), weight 98.4 kg, SpO2 95 %.  PHYSICAL EXAMINATION:  Physical Exam   GENERAL:  63 y.o.-year-old chronically ill-appearing patient lying in the bed with no acute distress. EYES: Pupils equal, round, reactive to light and accommodation. No scleral icterus.  HEENT: Head atraumatic, normocephalic. Oropharynx and nasopharynx clear.  NECK:  Supple, no jugular venous distention. No thyroid enlargement, no tenderness.  LUNGS: Normal breath sounds bilaterally, no wheezing, rales,rhonchi or crepitation. No use of accessory muscles of respiration.  CARDIOVASCULAR: S1, S2 normal. No murmurs.  ABDOMEN: Soft, nontender, nondistended. Bowel sounds present. No organomegaly or mass. Foley+  PEG+ EXTREMITIES: No pedal edema, cyanosis, or clubbing.  NEUROLOGIC: No obvious facial droop noted.Marland Kitchen able to track some, opening eyes to [facility]   Not following any commands.  Alert but non verbal.   Sensation intact. Gait not checked.   Has 2+ symmetric bilateral lower extremity reflexes PSYCHIATRIC: patient is alert but, remains nonverbal SKIN: No obvious rash, lesion, or ulcer--details per RN assessment   LABORATORY PANEL:   CBC Recent Labs  Lab 11/17/18 0553  WBC 12.2*  HGB 9.5*   HCT 30.7*  PLT 410*   ------------------------------------------------------------------------------------------------------------------  Chemistries  Recent Labs  Lab 11/17/18 0553  NA 140  K 4.1  CL 105  CO2 27  GLUCOSE 153*  BUN 22  CREATININE 0.58*  CALCIUM 8.7*   ------------------------------------------------------------------------------------------------------------------  Cardiac Enzymes No results for input(s): TROPONINI in the last 168 hours. ------------------------------------------------------------------------------------------------------------------  RADIOLOGY:  Ct Head Wo Contrast  Result Date: 11/18/2018 CLINICAL DATA:  History of CVA. Hypoglycemia. EXAM: CT HEAD WITHOUT CONTRAST TECHNIQUE: Contiguous axial images were obtained from the base of the skull through the vertex without intravenous contrast. COMPARISON:  10/25/2018. FINDINGS: Brain: Bifrontal encephalomalacia related to aneurysm surgery. No acute hemorrhage. No acute stroke, mass lesion, or hydrocephalus. Auditory assist device, LEFT calvarium obscures adjacent brain. Vascular: Calcification of the cavernous internal carotid arteries consistent with cerebrovascular atherosclerotic disease. No signs of intracranial large vessel occlusion. Skull: Unremarkable appearing craniotomy. Sinuses/Orbits: No acute findings. Other: None. IMPRESSION: Chronic changes as described. No acute intracranial findings. Unchanged appearance compared with most recent priors. Electronically Signed   By: Staci Righter M.D.   On: 11/18/2018 13:09    EKG:   Orders placed or performed in visit on 04/13/17  . 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  1. Acute  encephalopathy-suspected secondary to hypoglycemia when pt presented to the hospital -pt required mechanical ventilation -Has bilateral encephalomalacia based on  CT head.  Prior to left frontal craniotomy done.  Prior history of strokes. -Has right anterior cerebral artery aneurysm embolization in the past and clipping recently this year. -MRI cannot be done due to recent clipping. -Appreciate neurology consult.  Provigil dose has been increased without significant change.  Remains alert but not following commands. -Very slow recovery expected at this point.  Or this could be his new baseline. -S/P PEG placement on June 5th 2020-PEG feeding going well  2. Sepsis-secondary to urinary tract infection.  All cultures have been negative.  Off pressors.  Off steroids. --Finished broad-spectrum antibiotics  3.  Acute renal failure-secondary to acute urinary retention with history of BPH -Appreciate nephrology consult.  Bilateral hydronephrosis on renal ultrasound. - Status post Foley cath -Renal function is normalized at this time  4. Poor oral intake/Nutrition - now s/p PEG tube placemen  - Patient had a G-tube and tracheostomy which was recently removed couple of months ago and patient was eating by mouth.  -PEG feeding going well  5.  Seizure prevention -prophylactically on Keppra.  6.  DVT prophylaxis-subcutaneous lovenox  - Pt was denied LTAC services per CSW. He was from [facility]. CSW working with dter for SNF d/c planning. Pt at present baseline (best new) for d/c -Dr Posey Pronto had peer to peer review with Dr. Aris [place] with patient's insurance company. To him reviewing at physical therapy notes prior to patient being admitted here he was not performing very well. Patient was wheelchair-bound as well. And given this hospitalization and current mental status he does not foresee patient improving with physical therapy. Patient is at goal rate for his peg feeding. He recommends long-term care.  Unfortunately patient does not have long-term care plan under his insurance.  Follw up with Education officer, museum and family PT & OT recommend SNF/LTACH Social worker f/u  7.  Insurance authorization to SNF denied..  Follow-up with social worker again.  8.  Await placement [facility] has refused Awaited placement.  Today patient's daughter requested to transfer to [facility] for second opinion as he had his care in past at [facility]. Neurologist had done a repeat CT had which did not show any new findings and he talked to patient's daughter to explain that this could be new baseline.  They still request transfer so I made a call to [facility] transfer center.   CODE STATUS: Full code  TOTAL TIME TAKING CARE OF THIS PATIENT: 50 minutes.   POSSIBLE D/C IN ?  DAYS, DEPENDING ON CLINICAL CONDITION.   Vaughan Basta M.D on 11/18/2018 at 3:43 PM  Between 7am to 6pm - Pager - [phone]  After 6pm go to [email or web address] - password [email or web address] [place] [facility]  Office  [phone]  CC: Primary care physician; Garwin Brothers, MD

## 2018-11-18 NOTE — Progress Notes (Signed)
I spoke to [facility] transfer center and finally spoke to Dr. Janith Lima have denied patient's transfer due to seemingly this could be patient's new baseline and there could not be new health they might be offering.  And due to COVID-19 pandemic they are not accepting any transfers on family request currently.

## 2018-11-19 NOTE — Care Management Important Message (Signed)
Important Message  Patient Details  Name: [name] MRN: [id number] Date of Birth: November 07, 1955   Medicare Important Message Given:  Yes  Patient sleeping, left another copy of Important Message at bedside.   [name] A [name] 11/19/2018, 10:46 AM

## 2018-11-19 NOTE — Progress Notes (Signed)
[place] [facility] [place] NAME: [name]    MR#:  [id number]  DATE OF BIRTH:  [birth year]  SUBJECTIVE:   -pt opens eyes on verbal commands. Tracks with eyes in the room. Non verbal -s/p PEG tube placement No new issues over night  s/p PT and OT evaluation Awake, non verbal Has bilateral hand splints obtained from [facility] after recommendation by OT  REVIEW OF SYSTEMS:  Review of Systems  Unable to perform ROS: Mental status change    DRUG ALLERGIES:   Allergies  Allergen Reactions  . Metformin Diarrhea    VITALS:  Blood pressure (!) 155/81, pulse 99, temperature 99.3 F (37.4 C), temperature source Oral, resp. rate (!) 22, height 6' (1.829 m), weight 99.3 kg, SpO2 94 %.  PHYSICAL EXAMINATION:  Physical Exam   GENERAL:  63 y.o.-year-old chronically ill-appearing patient lying in the bed with no acute distress. EYES: Pupils equal, round, reactive to light and accommodation. No scleral icterus.  HEENT: Head atraumatic, normocephalic. Oropharynx and nasopharynx clear.  NECK:  Supple, no jugular venous distention. No thyroid enlargement, no tenderness.  LUNGS: Normal breath sounds bilaterally, no wheezing, rales,rhonchi or crepitation. No use of accessory muscles of respiration.  CARDIOVASCULAR: S1, S2 normal. No murmurs.  ABDOMEN: Soft, nontender, nondistended. Bowel sounds present. No organomegaly or mass. Foley+  PEG+ EXTREMITIES: No pedal edema, cyanosis, or clubbing.  NEUROLOGIC: No obvious facial droop noted.Marland Kitchen able to track some, opening eyes to [facility]   Not following any commands.  Alert but non verbal.   Sensation intact. Gait not checked.   Has 2+ symmetric bilateral lower extremity reflexes PSYCHIATRIC: patient is alert but, remains nonverbal SKIN: No obvious rash, lesion, or ulcer--details per RN assessment   LABORATORY PANEL:   CBC Recent Labs  Lab 11/17/18 0553  WBC 12.2*  HGB 9.5*  HCT 30.7*  PLT 410*    ------------------------------------------------------------------------------------------------------------------  Chemistries  Recent Labs  Lab 11/17/18 0553  NA 140  K 4.1  CL 105  CO2 27  GLUCOSE 153*  BUN 22  CREATININE 0.58*  CALCIUM 8.7*   ------------------------------------------------------------------------------------------------------------------  Cardiac Enzymes No results for input(s): TROPONINI in the last 168 hours. ------------------------------------------------------------------------------------------------------------------  RADIOLOGY:  Ct Head Wo Contrast  Result Date: 11/18/2018 CLINICAL DATA:  History of CVA. Hypoglycemia. EXAM: CT HEAD WITHOUT CONTRAST TECHNIQUE: Contiguous axial images were obtained from the base of the skull through the vertex without intravenous contrast. COMPARISON:  10/25/2018. FINDINGS: Brain: Bifrontal encephalomalacia related to aneurysm surgery. No acute hemorrhage. No acute stroke, mass lesion, or hydrocephalus. Auditory assist device, LEFT calvarium obscures adjacent brain. Vascular: Calcification of the cavernous internal carotid arteries consistent with cerebrovascular atherosclerotic disease. No signs of intracranial large vessel occlusion. Skull: Unremarkable appearing craniotomy. Sinuses/Orbits: No acute findings. Other: None. IMPRESSION: Chronic changes as described. No acute intracranial findings. Unchanged appearance compared with most recent priors. Electronically Signed   By: Staci Righter M.D.   On: 11/18/2018 13:09    EKG:   Orders placed or performed in visit on 04/13/17  . 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  1. Acute encephalopathy- suspected  secondary to hypoglycemia when pt presented to the hospital - pt required mechanical ventilation initially -Has bilateral encephalomalacia based on  CT head. Hx of left frontal craniotomy done.  Prior history of strokes. -Has right anterior cerebral artery aneurysm embolization in the past and clipping recently this year. - MRI cannot be done due to recent clipping. -Appreciate neurology consult.  Provigil dose has been increased without significant change.  Remains alert but not following commands. -Very slow recovery expected at this point.  Or this could be his new baseline. -S/P PEG placement on June 5th 2020-PEG feeding going well  2. Sepsis-secondary to urinary tract infection.  All cultures have been negative.  Off pressors.  Off steroids. --Finished broad-spectrum antibiotics  3.  Acute renal failure-secondary to acute urinary retention with history of BPH -Appreciate nephrology consult.  Bilateral hydronephrosis on renal ultrasound. - Status post Foley cath -Renal function is normalized at this time  4. Poor oral intake/Nutrition - now s/p PEG tube placemen  - Patient had a G-tube and tracheostomy which was recently removed couple of months ago and patient was eating by mouth.  -PEG feeding going well  5.  Seizure prevention -prophylactically on Keppra.  6.  DVT prophylaxis-subcutaneous lovenox  - Pt was denied LTAC services per CSW. He was from [facility]. CSW working with dter for SNF d/c planning. Pt at present baseline (best new) for d/c -Dr Posey Pronto had peer to peer review with Dr. Aris Georgia with patient's insurance company. To him reviewing at physical therapy notes prior to patient being admitted here he was not performing very well. Patient was wheelchair-bound as well. And given this hospitalization and current mental status he does not foresee patient improving with physical therapy. Patient is at goal rate for his peg feeding. He recommends long-term care. Unfortunately patient  does not have long-term care plan under his insurance.  Follw up with Education officer, museum and family PT & OT recommend SNF/LTACH Social worker f/u  7.  Insurance authorization to SNF denied..  Follow-up with social worker again.  8.  [facility]wait [facility]waited placement.   patient's daughter requested to transfer to [facility] for second opinion as he had his care in past at [facility]. Neurologist had done a repeat CT had which did not show any new findings and he talked to patient's daughter to explain that this could be new baseline.  They still request transfer so I made a call to [facility] transfer center.  I spoke to [facility] transfer center and the physician was there had denied transfer as there is no additional services they would be providing to this patient than what we had done. I informed daughter about this.   CODE STATUS: Full code  TOTAL TIME TAKING CARE OF THIS PATIENT: 32 minutes.   POSSIBLE D/C IN ?  DAYS, DEPENDING ON CLINICAL CONDITION.   Vaughan Basta M.D on 11/19/2018 at 3:42 PM  Between 7am to 6pm - Pager - [phone]  After 6pm go to [email or web address] - password [email or web address] [place] [facility]  Office  [phone]  CC: Primary care physician; Garwin Brothers, MD

## 2018-11-19 NOTE — Progress Notes (Signed)
OT Cancellation Note  Patient Details Name: [name] MRN: [id number] DOB: [birth year]   Cancelled Treatment:    Reason Eval/Treat Not Completed: Other (comment). Pt with nursing for pt care. Will re-attempt OT treat at later date/time as pt is available.   Jeni Salles, MPH, MS, OTR/L ascom [phone] 11/19/18, 4:14 PM

## 2018-11-19 NOTE — Plan of Care (Signed)

## 2018-11-19 NOTE — TOC Progression Note (Signed)
Transition of Care [facility]) - Progression Note    Patient Details  Name: [name] MRN: [id number] Date of Birth: 1955-08-13  [facility] CM/SW Contact  Shelbie Hutching, RN Phone Number: 11/19/2018, 2:31 PM  Clinical Narrative:    MD has spoken with daughter and transfer to [facility] is not an option they have refused to accept patient.  Olivia Mackie with [facility] will still accept patient as long as the daughter understands that there is a copay and they will have to pay the copay.  RNCM attempted to reach [name] and talke with her about this, no answer but message for call back left.   Daughter returns call within just a few minutes.  [name] is okay with patient waiting for a bed at [facility]- they do not have beds right now and may not have any open beds this week.   Expected Discharge Plan: [place] Barriers to Discharge: No SNF bed, SNF Authorization Denied, Barriers Unresolved (comment)(Family plan to private pay for [facility]- no beds available until 6/18)  Expected Discharge Plan and Services Expected Discharge Plan: [place]                                               Social Determinants of Health (SDOH) Interventions    Readmission Risk Interventions Readmission Risk Prevention Plan 11/08/2018  Post Dischage Appt Complete  Medication Screening Complete  Transportation Screening Complete  Some recent data might be hidden

## 2018-11-20 NOTE — Progress Notes (Signed)
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 who presented from his facility unresponsive requiring him to be ventilated in the ED. Patient has been seen in previous years at behavioral health for suicide ideation.     PT Comments    Discussed with RN and OK given to initiate OOB today.  Tube feeding held for transfer.  Rolling left/right dependant.  Ceiling lift in room was used to transfer pt to recliner at bedside.  Hoyer sling removed and pt was left in chair with OT.  Discussed transfer status with nurse tech.  Tube feeding restarted after session.   Discussed with primary PT.   Follow Up Recommendations  SNF;LTACH     Equipment Recommendations  None recommended by PT    Recommendations for Other Services       Precautions / Restrictions Precautions Precautions: Fall Restrictions Weight Bearing Restrictions: No Other Position/Activity Restrictions: bilat resting hand splints - continue to assess for optimal positioning/fitting, skin integrity    Mobility  Bed Mobility Overal bed mobility: Needs Assistance Bed Mobility: Rolling Rolling: Max assist         General bed mobility comments: rolling left/right to place hoyer pad  Transfers Overall transfer level: Needs assistance               General transfer comment: ceiling lift in room used to transfer pt to recliner at bedside.  Ambulation/Gait             General Gait Details: unable   Stairs             Wheelchair Mobility    Modified Rankin (Stroke Patients Only)       Balance                                            Cognition Arousal/Alertness: Awake/alert Behavior  During Therapy: Flat affect Overall Cognitive Status: Difficult to assess                                        Exercises      General Comments        Pertinent Vitals/Pain Pain Assessment: Faces Faces Pain Scale: No hurt Pain Location: no indication of pain with transfer    Home Living                      Prior Function            PT Goals (current goals can now be found in the care plan section) Progress towards PT goals: Progressing toward goals    Frequency    Min 2X/week      PT Plan Current plan remains appropriate    Co-evaluation              AM-PAC PT "6 Clicks" Mobility   Outcome Measure  Help needed turning from your back to your side while in a flat bed without using bedrails?: Total Help needed moving from lying on your back to sitting on  the side of a flat bed without using bedrails?: Total Help needed moving to and from a bed to a chair (including a wheelchair)?: Total Help needed standing up from a chair using your arms (e.g., wheelchair or bedside chair)?: Total Help needed to walk in hospital room?: Total Help needed climbing 3-5 steps with a railing? : Total 6 Click Score: 6    End of Session Equipment Utilized During Treatment: Other (comment) Activity Tolerance: Patient tolerated treatment well Patient left: in chair;with call bell/phone within reach;with chair alarm set;Other (comment) Nurse Communication: Mobility status       Time: 0370-4888 PT Time Calculation (min) (ACUTE ONLY): 14 min  Charges:  $Therapeutic Activity: 8-22 mins                    Chesley Noon, PTA 11/20/18, 12:52 PM

## 2018-11-20 NOTE — Progress Notes (Signed)
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 who presented from his facility unresponsive requiring him to be ventilated in the ED. Patient has been seen in previous years at behavioral health for suicide ideation.    OT comments  Pt. was up in recliner chair upon arrival. Pt. was alert, and nonverbal. Pt. performed PROM in all joint ranges. Splint checks were performed. Palmar skin integrity is intact. Splints reapplied. Straps were readjusted to ensure proper fit. Nursing was notified. Pt. Was assisted with positioning. Pt. Continues to benefit from OT services for ADL training, splint assessment, positioning, and care, A/E training, and pt./caregiver education. Pt. Continues to benefit from SNF level of care upon discharge, and will benefit from follow-up OT services at discharge.       Follow Up Recommendations  SNF    Equipment Recommendations  None recommended by OT    Recommendations for Other Services      Precautions / Restrictions Precautions Precautions: Fall Restrictions Weight Bearing Restrictions: No Other Position/Activity Restrictions: bilat resting hand splints - continue to assess for optimal positioning/fitting, skin integrity       Mobility Bed Mobility Overal bed mobility: Needs Assistance Bed Mobility: Rolling Rolling: Max assist         General bed mobility comments: rolling left/right to place hoyer pad  Transfers Overall transfer level: Needs assistance               General transfer comment: ceiling lift in room used to transfer pt to recliner at bedside.    Balance                                           ADL either  performed or assessed with clinical judgement   ADL   Eating/Feeding: NPO                                     General ADL Comments: Pt. continues to require total assist all LB ADLs, and Pt. continues to be NPO.     Vision       Perception     Praxis      Cognition Arousal/Alertness: Awake/alert Behavior During Therapy: Flat affect Overall Cognitive Status: Difficult to assess Area of Impairment: Following commands                               General Comments: Eyes open during session, nonverbal, and limited attention.        Exercises Other Exercises Other Exercises: BUE ROM for all joint reanges secondary to joint stiffness, and contracture.   Shoulder Instructions       General Comments      Pertinent Vitals/ Pain       Pain Assessment: Faces Faces Pain Scale: No hurt Pain Location: no indication of pain with transfer  Home Living       Type of Home: Apartment  Prior Functioning/Environment              Frequency  Min 1X/week        Progress Toward Goals  OT Goals(current goals can now be found in the care plan section)  Progress towards OT goals: Progressing toward goals  Acute Rehab OT Goals OT Goal Formulation: Patient unable to participate in goal setting  Plan Discharge plan remains appropriate    Co-evaluation                 AM-PAC OT "6 Clicks" Daily Activity     Outcome Measure   Help from another person eating meals?: Total Help from another person taking care of personal grooming?: Total Help from another person toileting, which includes using toliet, bedpan, or urinal?: Total Help from another person bathing (including washing, rinsing, drying)?: Total Help from another person to put on and taking off regular upper body clothing?: Total Help from another person to put on and taking off regular lower body clothing?: Total 6 Click Score:  6    End of Session    OT Visit Diagnosis: Other abnormalities of gait and mobility (R26.89);Cognitive communication deficit (R41.841);Muscle weakness (generalized) (M62.81)   Activity Tolerance Patient tolerated treatment well;Patient limited by fatigue   Patient Left with call bell/phone within reach;with bed alarm set;in chair   Nurse Communication          Time: 9833-8250 OT Time Calculation (min): 20 min  Charges: OT Treatments $Self Care/Home Management : 8-22 mins Harrel Carina, MS, OTR/L   Harrel Carina 11/20/2018, 3:04 PM

## 2018-11-20 NOTE — Progress Notes (Signed)
[name] at [place] NAME: [name]    MR#:  [id number]  DATE OF BIRTH:  03-14-1956  SUBJECTIVE:   -pt opens eyes on verbal commands. Tracks with eyes in the room. Non verbal -s/p PEG tube placement No new issues over night  s/p PT and OT evaluation Awake, non verbal Has bilateral hand splints obtained from [facility] after recommendation by OT  REVIEW OF SYSTEMS:  Review of Systems  Unable to perform ROS: Mental status change    DRUG ALLERGIES:   Allergies  Allergen Reactions  . Metformin Diarrhea    VITALS:  Blood pressure (!) 142/78, pulse 94, temperature 99.1 F (37.3 C), temperature source Oral, resp. rate 16, height 6' (1.829 m), weight 99.2 kg, SpO2 93 %.  PHYSICAL EXAMINATION:  Physical Exam   GENERAL:  63 y.o.-year-old chronically ill-appearing patient lying in the bed with no acute distress. EYES: Pupils equal, round, reactive to light and accommodation. No scleral icterus.  HEENT: Head atraumatic, normocephalic. Oropharynx and nasopharynx clear.  NECK:  Supple, no jugular venous distention. No thyroid enlargement, no tenderness.  LUNGS: Normal breath sounds bilaterally, no wheezing, rales,rhonchi or crepitation. No use of accessory muscles of respiration.  CARDIOVASCULAR: S1, S2 normal. No murmurs.  ABDOMEN: Soft, nontender, nondistended. Bowel sounds present. No organomegaly or mass. Foley+  PEG+ EXTREMITIES: No pedal edema, cyanosis, or clubbing.  NEUROLOGIC: No obvious facial droop noted.Marland Kitchen able to track some, opening eyes to [facility]   Not following any commands.  Alert but non verbal.   Sensation intact. Gait not checked.   Has 2+ symmetric bilateral lower extremity reflexes PSYCHIATRIC: patient is alert but, remains nonverbal SKIN: No obvious rash, lesion, or ulcer--details per RN assessment   LABORATORY PANEL:   CBC Recent Labs  Lab 11/17/18 0553  WBC 12.2*  HGB 9.5*  HCT 30.7*  PLT 410*    ------------------------------------------------------------------------------------------------------------------  Chemistries  Recent Labs  Lab 11/17/18 0553  NA 140  K 4.1  CL 105  CO2 27  GLUCOSE 153*  BUN 22  CREATININE 0.58*  CALCIUM 8.7*   ------------------------------------------------------------------------------------------------------------------  Cardiac Enzymes No results for input(s): TROPONINI in the last 168 hours. ------------------------------------------------------------------------------------------------------------------  RADIOLOGY:  No results found.  EKG:   Orders placed or performed in visit on 04/13/17  . 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  1. Acute encephalopathy- suspected secondary to hypoglycemia when pt presented to the hospital - pt required mechanical ventilation initially -Has bilateral encephalomalacia based on CT head. Hx of left frontal craniotomy done.  Prior history of strokes. -Has right anterior cerebral artery aneurysm embolization in the past and clipping recently this year. - MRI cannot be done due to recent clipping. -Appreciate neurology consult.  Provigil dose has been increased without significant change.  Remains alert but not following commands. -Very slow recovery expected at this point.  Or this could be his new baseline. -S/P PEG placement on June 5th 2020-PEG feeding going well  2. Sepsis-secondary to urinary tract infection.  All cultures have been negative.  Off pressors.  Off steroids. --Finished broad-spectrum antibiotics  3.  Acute renal failure-secondary to acute urinary retention with history of BPH -Appreciate nephrology consult.  Bilateral  hydronephrosis on renal ultrasound. -  Status post Foley cath -Renal function is normalized at this time  4. Poor oral intake/Nutrition - now s/p PEG tube placemen  - Patient had a G-tube and tracheostomy which was recently removed couple of months ago and patient was eating by mouth.  -PEG feeding going well  5.  Seizure prevention -prophylactically on Keppra.  6.  DVT prophylaxis-subcutaneous lovenox  - Pt was denied LTAC services per CSW. He was from [facility]. CSW working with dter for SNF d/c planning. Pt at present baseline (best new) for d/c -Dr Posey Pronto had peer to peer review with Dr. Aris Georgia with patient's insurance company. To him reviewing at physical therapy notes prior to patient being admitted here he was not performing very well. Patient was wheelchair-bound as well. And given this hospitalization and current mental status he does not foresee patient improving with physical therapy. Patient is at goal rate for his peg feeding. He recommends long-term care. Unfortunately patient does not have long-term care plan under his insurance.  Follw up with Education officer, museum and family PT & OT recommend SNF/LTACH Social worker f/u  7.  Insurance authorization to SNF denied..  Follow-up with social worker again.  8.  [facility]waited placement.   patient's daughter requested to transfer to [facility] for second opinion as he had his care in past at [facility]. Neurologist had done a repeat CT had which did not show any new findings and he talked to patient's daughter to explain that this could be new baseline.  They still request transfer so I made a call to [facility] transfer center.  I spoke to [facility] transfer center and the physician was there had denied transfer as there is no additional services they would be providing to this patient than what we had done. I informed daughter about this. Case manager is waiting for bed availability.  CODE STATUS: Full code  TOTAL TIME TAKING  CARE OF THIS PATIENT: 64 minutes.   POSSIBLE D/C IN ?  DAYS, DEPENDING ON CLINICAL CONDITION.   Vaughan Basta M.D on 11/20/2018 at 3:44 PM  Between 7am to 6pm - Pager - [phone]  After 6pm go to [email or web address] - password [email or web address] [place] [facility]  Office  [phone]  CC: Primary care physician; Garwin Brothers, MD

## 2018-11-21 NOTE — TOC Progression Note (Signed)
Transition of Care [facility]) - Progression Note    Patient Details  Name: [name] MRN: [id number] Date of Birth: 1955/07/18  Transition of Care [facility]) CM/SW Contact  Shelbie Hutching, RN Phone Number: 11/21/2018, 4:08 PM  Clinical Narrative:     Medicaid case worker is Raynaldo Opitz [phone].  Email [email or web address].  RNCM left voicemail and sent email about pending Medicaid.    Expected Discharge Plan: [place] Barriers to Discharge: No SNF bed, SNF Authorization Denied, Barriers Unresolved (comment)(Family plan to private pay for [facility]- no beds available until 6/18)  Expected Discharge Plan and Services Expected Discharge Plan: [place]                                               Social Determinants of Health (SDOH) Interventions    Readmission Risk Interventions Readmission Risk Prevention Plan 11/08/2018  Post Dischage Appt Complete  Medication Screening Complete  Transportation Screening Complete  Some recent data might be hidden

## 2018-11-21 NOTE — Care Management Important Message (Signed)
Important Message  Patient Details  Name: [name] MRN: [id number] Date of Birth: Aug 25, 1955   Medicare Important Message Given:  Yes     [name] A Brydan Downard 11/21/2018, 11:17 AM

## 2018-11-21 NOTE — Progress Notes (Signed)
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 who presented from his facility unresponsive requiring him to be ventilated in the ED. Patient has been seen in previous years at behavioral health for suicide ideation.    OT comments  Pt seen for OT treatment on this date. Upon arrival to room pt supine in bed, sleeping soundly. Pt roused with verbal cueing/sternal rub. Pt lethargic t/o OT session. Kept eyes closed, but would open eyes when asked. Unsure if able to follow command or roused by increased verbal stimuli. OT assisted pt with PROM in all joint ranges. Pt noted to be significantly more limited in LUE than RUE with wrist flexion/extension particularly difficulty. Pt also continues to have increased swelling around the L unlar styloid process. Splint checks were performed. Palmar/dorsal skin integrity is intact. RUE splint re-applied and LUE splint removed for rest break. RN informed this OT she is alternating RUE/LUE for wear schedule. Straps were adjusted to ensure proper fit. Nursing was notified of splint application and wrist limitations in LUE. Pt continues to benefit from skilled OT services to maximize return to PLOF and minimize risk of future falls, injury, caregiver burden, and readmission. Will continue to follow POC. Discharge recommendation remains appropriate.    Follow Up Recommendations  SNF    Equipment Recommendations  None recommended by OT    Recommendations for Other Services      Precautions / Restrictions Precautions Precautions: Fall Restrictions Weight Bearing Restrictions: No Other Position/Activity Restrictions: bilat resting hand splints - continue to assess for  optimal positioning/fitting, skin integrity       Mobility Bed Mobility Overal bed mobility: Needs Assistance                Transfers Overall transfer level: Needs assistance               General transfer comment: Dependent for all transfers. Celing lift in room.    Balance                                           ADL either performed or assessed with clinical judgement   ADL Overall ADL's : Needs assistance/impaired                                       General ADL Comments: Pt. continues to require total assist all LB ADLs, and Pt. continues to be NPO.     Vision Baseline Vision/History: (Unable to assess 2/2 pt cognitive status.) Patient Visual Report: Other (comment)(Unable to assess 2/2 pt cognitive status.)     Perception     Praxis      Cognition Arousal/Alertness: Lethargic Behavior During Therapy: Flat affect Overall Cognitive Status: Difficult to assess Area of Impairment: Following commands                               General Comments: Eyes closed t/o much of session, however pt does open eyes when asked.  Exercises General Exercises - Upper Extremity Shoulder Flexion: PROM;Both;10 reps;Supine Elbow Flexion: PROM;Both;Supine;5 reps Elbow Extension: Both;PROM;5 reps Wrist Flexion: PROM;Both;5 reps;Supine(LUE much more limited in ROM) Wrist Extension: PROM;Both;5 reps;Supine(LUE much more limited in ROM) Composite Extension: PROM;Both;5 reps;Supine Other Exercises Other Exercises: BUE ROM for all joint reanges secondary to joint stiffness, and contracture. Other Exercises: bilat resting hand splints ordered and in room. Pt noted to only be wearing LUE splint. Per RN, pt alternating RUE/LUE wear schedule. Skin checked for breakdown/redness (none noted), replaced RUE splint with optimal positioning after BUE PROM   Shoulder Instructions       General Comments Pt in bed at  start/end of session. Resting hand splint in place on RUE.    Pertinent Vitals/ Pain       Faces Pain Scale: Hurts a little bit Pain Descriptors / Indicators: Grimacing Pain Intervention(s): Limited activity within patient's tolerance;Monitored during session;Repositioned  Home Living                                          Prior Functioning/Environment              Frequency  Min 1X/week        Progress Toward Goals  OT Goals(current goals can now be found in the care plan section)     Acute Rehab OT Goals OT Goal Formulation: Patient unable to participate in goal setting  Plan Discharge plan remains appropriate    Co-evaluation                 AM-PAC OT "6 Clicks" Daily Activity     Outcome Measure   Help from another person eating meals?: Total Help from another person taking care of personal grooming?: Total Help from another person toileting, which includes using toliet, bedpan, or urinal?: Total Help from another person bathing (including washing, rinsing, drying)?: Total Help from another person to put on and taking off regular upper body clothing?: Total Help from another person to put on and taking off regular lower body clothing?: Total 6 Click Score: 6    End of Session    OT Visit Diagnosis: Other abnormalities of gait and mobility (R26.89);Cognitive communication deficit (R41.841);Muscle weakness (generalized) (M62.81) Symptoms and signs involving cognitive functions: Other cerebrovascular disease   Activity Tolerance Patient tolerated treatment well;Patient limited by fatigue   Patient Left with call bell/phone within reach;with bed alarm set;in chair   Nurse Communication (Pt noted to have drying skin on RUE, Lotion applied. LUE splint applied.)        Time: 5277-8242 OT Time Calculation (min): 23 min  Charges: OT General Charges $OT Visit: 1 Visit OT Treatments $Self Care/Home Management : 8-22  mins $Therapeutic Exercise: 8-22 mins  Shara Blazing, M.S., OTR/L Ascom: [phone] 11/21/18, 3:23 PM

## 2018-11-21 NOTE — Progress Notes (Signed)
[place] at [place] NAME: Govani Radloff    MR#:  [id number]  DATE OF BIRTH:  08/25/1955  SUBJECTIVE:   -pt opens eyes on verbal commands. Tracks with eyes in the room. Non verbal -s/p PEG tube placement No new issues over night  s/p PT and OT evaluation Awake, non verbal Has bilateral hand splints obtained from [facility] after recommendation by OT  REVIEW OF SYSTEMS:  Review of Systems  Unable to perform ROS: Mental status change    DRUG ALLERGIES:   Allergies  Allergen Reactions  . Metformin Diarrhea    VITALS:  Blood pressure 128/70, pulse 92, temperature 98.8 F (37.1 C), temperature source Oral, resp. rate 20, height 6' (1.829 m), weight 99.2 kg, SpO2 96 %.  PHYSICAL EXAMINATION:  Physical Exam   GENERAL:  63 y.o.-year-old chronically ill-appearing patient lying in the bed with no acute distress. EYES: Pupils equal, round, reactive to light and accommodation. No scleral icterus.  HEENT: Head atraumatic, normocephalic. Oropharynx and nasopharynx clear.  NECK:  Supple, no jugular venous distention. No thyroid enlargement, no tenderness.  LUNGS: Normal breath sounds bilaterally, no wheezing, rales,rhonchi or crepitation. No use of accessory muscles of respiration.  CARDIOVASCULAR: S1, S2 normal. No murmurs.  ABDOMEN: Soft, nontender, nondistended. Bowel sounds present. No organomegaly or mass. Foley+  PEG+ EXTREMITIES: No pedal edema, cyanosis, or clubbing.  NEUROLOGIC: No obvious facial droop noted.Marland Kitchen able to track some, opening eyes to [facility]   Not following any commands.  Alert but non verbal.   Sensation intact. Gait not checked.   Has 2+ symmetric bilateral lower extremity reflexes PSYCHIATRIC: patient is alert but, remains nonverbal SKIN: No obvious rash, lesion, or ulcer--details per RN assessment   LABORATORY PANEL:   CBC Recent Labs  Lab 11/17/18 0553  WBC 12.2*  HGB 9.5*  HCT 30.7*  PLT 410*    ------------------------------------------------------------------------------------------------------------------  Chemistries  Recent Labs  Lab 11/17/18 0553  NA 140  K 4.1  CL 105  CO2 27  GLUCOSE 153*  BUN 22  CREATININE 0.58*  CALCIUM 8.7*   ------------------------------------------------------------------------------------------------------------------  Cardiac Enzymes No results for input(s): TROPONINI in the last 168 hours. ------------------------------------------------------------------------------------------------------------------  RADIOLOGY:  No results found.  EKG:   Orders placed or performed in visit on 04/13/17  . 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  1. Acute encephalopathy- suspected secondary to hypoglycemia when pt presented to the hospital - pt required mechanical ventilation initially -Has bilateral encephalomalacia based on CT head. Hx of left frontal craniotomy done.  Prior history of strokes. -Has right anterior cerebral artery aneurysm embolization in the past and clipping recently this year. - MRI cannot be done due to recent clipping. -Appreciate neurology consult.  Provigil dose has been increased without significant change.  Remains alert but not following commands. -Very slow recovery expected at this point.  Or this could be his new baseline. -S/P PEG placement on June 5th 2020-PEG feeding going well  2. Sepsis-secondary to urinary tract infection.  All cultures have been negative.  Off pressors.  Off steroids. --Finished broad-spectrum antibiotics -Mains afebrile in last more than 24 hours  3.  Acute renal failure-secondary to acute urinary retention with history of BPH  -Appreciate nephrology consult.  Bilateral hydronephrosis on renal ultrasound. - Status post Foley cath -Renal function is normalized at this time  4. Poor oral intake/Nutrition - now s/p PEG tube placemen-tube feeds now.  - Patient had a G-tube and tracheostomy which was recently removed couple of months ago and patient was eating by mouth.  -PEG feeding going well  5.  Seizure prevention -prophylactically on Keppra.  6.  DVT prophylaxis-subcutaneous lovenox  - Pt was denied LTAC services per CSW. He was from [facility]. CSW working with dter for SNF d/c planning. Pt at present baseline (best new) for d/c -Dr Posey Pronto had peer to peer review with Dr. Aris Georgia with patient's insurance company. To him reviewing at physical therapy notes prior to patient being admitted here he was not performing very well. Patient was wheelchair-bound as well. And given this hospitalization and current mental status he does not foresee patient improving with physical therapy. Patient is at goal rate for his peg feeding. He recommends long-term care. Unfortunately patient does not have long-term care plan under his insurance.  Follw up with Education officer, museum and family PT & OT recommend SNF/LTACH Social worker f/u  7.  Insurance authorization to SNF denied..  Follow-up with social worker again.  8.  [facility]wait placement [facility]waited placement.   patient's daughter requested to transfer to [facility] for second opinion as he had his care in past at [facility]. Neurologist had done a repeat CT had which did not show any new findings and he talked to patient's daughter to explain that this could be new baseline.  They still request transfer so I made a call to [facility] transfer center.  I spoke to [facility] transfer center and the physician was there had denied transfer as there is no additional services they would be providing to this patient than what we had done. I informed daughter about this. Case manager is waiting for bed  availability.  CODE STATUS: Full code  TOTAL TIME TAKING CARE OF THIS PATIENT: 32 minutes.   POSSIBLE D/C IN ?  DAYS, DEPENDING ON CLINICAL CONDITION.   Vaughan Basta M.D on 11/21/2018 at 4:20 PM  Between 7am to 6pm - Pager - [phone]  After 6pm go to [email or web address] - password [email or web address] [place] [facility]  Office  [phone]  CC: Primary care physician; Garwin Brothers, MD

## 2018-11-22 NOTE — Progress Notes (Signed)
[place] at [place] NAME: [name]    MR#:  [id number]  DATE OF BIRTH:  [birth year]  SUBJECTIVE:   -pt opens eyes on verbal commands. Tracks with eyes in the room. Non verbal -s/p PEG tube placement No new issues over night  s/p PT and OT evaluation Awake, non verbal Has bilateral hand splints obtained from [facility] after recommendation by OT  REVIEW OF SYSTEMS:  Review of Systems  Unable to perform ROS: Mental status change    DRUG ALLERGIES:   Allergies  Allergen Reactions  . Metformin Diarrhea    VITALS:  Blood pressure (!) 148/82, pulse 96, temperature 98.3 F (36.8 C), temperature source Axillary, resp. rate 17, height 6' (1.829 m), weight 100.7 kg, SpO2 95 %.  PHYSICAL EXAMINATION:  Physical Exam   GENERAL:  63 y.o.-year-old chronically ill-appearing patient lying in the bed with no acute distress. EYES: Pupils equal, round, reactive to light and accommodation. No scleral icterus.  HEENT: Head atraumatic, normocephalic. Oropharynx and nasopharynx clear.  NECK:  Supple, no jugular venous distention. No thyroid enlargement, no tenderness.  LUNGS: Normal breath sounds bilaterally, no wheezing, rales,rhonchi or crepitation. No use of accessory muscles of respiration.  CARDIOVASCULAR: S1, S2 normal. No murmurs.  ABDOMEN: Soft, nontender, nondistended. Bowel sounds present. No organomegaly or mass. Foley+  PEG+ EXTREMITIES: No pedal edema, cyanosis, or clubbing.  NEUROLOGIC: No obvious facial droop noted.Marland Kitchen able to track some, opening eyes to [facility]   Not following any commands.  Alert but non verbal.   Sensation intact. Gait not checked.   Has 2+ symmetric bilateral lower extremity reflexes PSYCHIATRIC: patient is alert but, remains nonverbal SKIN: No obvious rash, lesion, or ulcer--details per RN assessment   LABORATORY PANEL:   CBC Recent Labs  Lab 11/17/18 0553  WBC 12.2*  HGB 9.5*  HCT 30.7*  PLT 410*    ------------------------------------------------------------------------------------------------------------------  Chemistries  Recent Labs  Lab 11/17/18 0553  NA 140  K 4.1  CL 105  CO2 27  GLUCOSE 153*  BUN 22  CREATININE 0.58*  CALCIUM 8.7*   ------------------------------------------------------------------------------------------------------------------  Cardiac Enzymes No results for input(s): TROPONINI in the last 168 hours. ------------------------------------------------------------------------------------------------------------------  RADIOLOGY:  No results found.  EKG:   Orders placed or performed in visit on 04/13/17  . 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  1. Acute encephalopathy- suspected secondary to hypoglycemia when pt presented to the hospital - pt required mechanical ventilation initially -Has bilateral encephalomalacia based on CT head. Hx of left frontal craniotomy done.  Prior history of strokes. -Has right anterior cerebral artery aneurysm embolization in the past and clipping recently this year. - MRI cannot be done due to recent clipping. -Appreciate neurology consult.  Provigil dose has been increased without significant change.  Remains alert but not following commands. - this could be his new baseline. -S/P PEG placement on June 5th 2020-PEG feeding going well  2. Sepsis-secondary to urinary tract infection.  All cultures have been negative.  Off pressors.  Off steroids. --Finished broad-spectrum antibiotics -Mains afebrile in last more than 24 hours  3.  Acute renal failure-secondary to acute urinary retention with history of BPH -Appreciate nephrology consult.  Bilateral  hydronephrosis on renal ultrasound. -  Status post Foley cath -Renal function is normalized at this time  4. Poor oral intake/Nutrition - now s/p PEG tube placemen-tube feeds now.  - Patient had a G-tube and tracheostomy which was recently removed couple of months ago and patient was eating by mouth.  -PEG feeding going well  5.  Seizure prevention -prophylactically on Keppra.  6.  DVT prophylaxis-subcutaneous lovenox  - Pt was denied LTAC services per CSW. He was from [facility]. CSW working with dter for SNF d/c planning. Pt at present baseline (best new) for d/c -Dr Posey Pronto had peer to peer review with Dr. Aris Georgia with patient's insurance company. To him reviewing at physical therapy notes prior to patient being admitted here he was not performing very well. Patient was wheelchair-bound as well. And given this hospitalization and current mental status he does not foresee patient improving with physical therapy. Patient is at goal rate for his peg feeding. He recommends long-term care. Unfortunately patient does not have long-term care plan under his insurance.  Follw up with Education officer, museum and family PT & OT recommend SNF/LTACH Social worker f/u  7.  Insurance authorization to SNF denied..  Follow-up with social worker again.  8.  [facility] refused Awaited placement.   patient's daughter requested to transfer to [facility] for second opinion as he had his care in past at [facility]. Neurologist had done a repeat CT had which did not show any new findings and he talked to patient's daughter to explain that this could be new baseline.  They still request transfer so I made a call to [facility] transfer center.  I spoke to [facility] transfer center and the physician was there had denied transfer as there is no additional services they would be providing to this patient than what we had done. I informed daughter about this.  Case manager is waiting for bed availability now again at [facility].   CODE STATUS: Full code  TOTAL TIME TAKING CARE OF THIS PATIENT: 23 minutes.   POSSIBLE D/C IN ?  DAYS, DEPENDING ON CLINICAL CONDITION.   Vaughan Basta M.D on 11/22/2018 at 1:30 PM  Between 7am to 6pm - Pager - [phone]  After 6pm go to [email or web address] - password [email or web address] [place] [facility]  Office  [phone]  CC: Primary care physician; Garwin Brothers, MD

## 2018-11-22 NOTE — Progress Notes (Signed)
Contacted MD about cloudy urine with some white discharge  No new ordered at this time

## 2018-11-22 NOTE — Progress Notes (Signed)
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 who presented from his facility unresponsive requiring him to be ventilated in the ED. Patient has been seen in previous years at behavioral health for suicide ideation.    OT comments  Pt seen for OT tx this date. Pt alert and visually tracks therapist in the room approx 75% of the time. RUE splint removed, skin checked, washed, dried, and lotion applied to optimize skin integrity. BUE ROM, pt stiff, difficulty with full ROM for extension in all joints, pt appears more stiff in LUE, grimaces with attempts to passively range wrist and composite finger flexion on L. Bed level toileting task with nurse tech assisting - pt able to assist by grasping bed rail once side lying. Sore on L buttocks cleansed gently, soiled pink foam pad removed by nurse tech. RN notified to replace. Pt noted to have sweat through all linens. Nurse tech present requesting to hold changing until RN is able to assess sore on buttocks. Foley catheter also noted to be concerning. RN and nurse tech also notified of cloudy yellow/white color of Foley catheter tubing contents, fluid not moving, and insertion point into penis appears moist/gooey. Concern for infection? Pt continues to benefit from skilled OT services to maximize pt's safety/indep and minimize falls, further functional decline, contractures, and caregiver burden.       Follow Up Recommendations  [facility]    Equipment Recommendations  Hospital bed;Other (comment)(Hoyer lift)    Recommendations for Other Services      Precautions / Restrictions Precautions Precautions: Fall;Other (comment) Precaution Comments: high risk of  BUE/BLE contractures Restrictions Weight Bearing Restrictions: No Other Position/Activity Restrictions: bilat resting hand splints - continue to assess for optimal positioning/fitting, skin integrity       Mobility Bed Mobility Overal bed mobility: Needs Assistance Bed Mobility: Rolling Rolling: Max assist;+2 for physical assistance         General bed mobility comments: rolling L/R for pericare  Transfers                      Balance Overall balance assessment: Needs assistance   Sitting balance-Leahy Scale: Zero                                     ADL either performed or assessed with clinical judgement   ADL Overall ADL's : Needs assistance/impaired                         Toilet Transfer: +2 for physical assistance;Total assistance Toilet Transfer Details (indicate cue type and reason): dep +2 rolling bed level for toileting Toileting- Clothing Manipulation and Hygiene: +2 for physical assistance;Bed level;Total assistance Toileting - Clothing Manipulation Details (indicate cue type and reason): in sidelying, dep for pericare, red sore noted on L buttocks, nurse tech aware, area gently cleansed and RN notified to reapply a clean pink foam pad             Vision Patient Visual Report: Other (comment)(Unable to assess 2/2 pt cognitive status)     Perception     Praxis  Cognition Arousal/Alertness: Lethargic Behavior During Therapy: Flat affect Overall Cognitive Status: Difficult to assess Area of Impairment: Following commands                       Following Commands: Follows one step commands inconsistently                Exercises Other Exercises Other Exercises: BUE ROM, pt stiff, difficulty with full ROM for extension in all joints, pt appears more stiff in LUE, grimaces with attempts to passively range wrist and composite finger flexion on L; RUE splint removed, skin checked, washed, dried, and  lotion applied to optimize skin integrity Other Exercises: bed level toileting task with nuse tech assisting - pt able to assist by grasping bed rail once sidelying   Shoulder Instructions       General Comments BUE splints removed to promote airflow and skin integrity; pt sweat through all of his linens. nurse tech in to assist, soiled chucks changed, per nurse tech she wanted to postpone sheets changes until RN could come and assess pt's sore on his buttocks. RN and nurse tech also notified of cloudy yellow/white color of Foley catheter tubing, fluid not moving, and insertion point into penis appears moist/gooey. Concern for infection?    Pertinent Vitals/ Pain       Pain Assessment: Faces Faces Pain Scale: Hurts little more Pain Location: sacrum with pericare and initially with attempt to gently stretch L wrist into flex/ext and composite finger ext Pain Descriptors / Indicators: Grimacing Pain Intervention(s): Limited activity within patient's tolerance;Monitored during session;Repositioned;Other (comment)(notified Advice worker)  Home Living                                          Prior Functioning/Environment              Frequency  Min 1X/week        Progress Toward Goals  OT Goals(current goals can now be found in the care plan section)  Progress towards OT goals: OT to reassess next treatment  Acute Rehab OT Goals OT Goal Formulation: Patient unable to participate in goal setting  Plan Discharge plan remains appropriate;Frequency remains appropriate    Co-evaluation                 AM-PAC OT "6 Clicks" Daily Activity     Outcome Measure   Help from another person eating meals?: Total Help from another person taking care of personal grooming?: Total Help from another person toileting, which includes using toliet, bedpan, or urinal?: Total Help from another person bathing (including washing, rinsing, drying)?: Total Help from  another person to put on and taking off regular upper body clothing?: Total Help from another person to put on and taking off regular lower body clothing?: Total 6 Click Score: 6    End of Session    OT Visit Diagnosis: Other abnormalities of gait and mobility (R26.89);Cognitive communication deficit (R41.841);Muscle weakness (generalized) (M62.81) Symptoms and signs involving cognitive functions: Other cerebrovascular disease   Activity Tolerance Patient tolerated treatment well   Patient Left in bed;with call bell/phone within reach;with bed alarm set;with nursing/sitter in room   Nurse Communication (sore on buttocks, discoloration of contents in Foley tubing and at insertion point of Foley, HR)        Time: 3762-8315 OT Time Calculation (min): 33 min  Charges: OT General Charges $OT Visit: 1 Visit OT Treatments $Self Care/Home Management : 8-22 mins $Therapeutic Exercise: 8-22 mins  Jeni Salles, MPH, MS, OTR/L ascom [phone] 11/22/18, 3:11 PM

## 2018-11-23 LAB — BASIC METABOLIC PANEL
Anion gap: 9 (ref 5–15)
BUN: 30 mg/dL — ABNORMAL HIGH (ref 8–23)
CO2: 27 mmol/L (ref 22–32)
Calcium: 8.7 mg/dL — ABNORMAL LOW (ref 8.9–10.3)
Chloride: 103 mmol/L (ref 98–111)
Creatinine, Ser: 0.53 mg/dL — ABNORMAL LOW (ref 0.61–1.24)
GFR calc Af Amer: >60 mL/min (ref >60)
GFR calc non Af Amer: >60 mL/min (ref >60)
Glucose, Bld: 137 mg/dL — ABNORMAL HIGH (ref 70–99)
Potassium: 3.8 mmol/L (ref 3.5–5.1)
Sodium: 139 mmol/L (ref 135–145)

## 2018-11-23 LAB — CBC
HCT: 32.6 % — ABNORMAL LOW (ref 39.0–52.0)
Hemoglobin: 10.2 g/dL — ABNORMAL LOW (ref 13.0–17.0)
MCH: 27.7 pg (ref 26.0–34.0)
MCHC: 31.3 g/dL (ref 30.0–36.0)
MCV: 88.6 fL (ref 80.0–100.0)
Platelets: 585 10*3/uL — ABNORMAL HIGH (ref 150–400)
RBC: 3.68 MIL/uL — ABNORMAL LOW (ref 4.22–5.81)
RDW: 17.8 % — ABNORMAL HIGH (ref 11.5–15.5)
WBC: 15.2 10*3/uL — ABNORMAL HIGH (ref 4.0–10.5)
nRBC: 0 % (ref 0.0–0.2)

## 2018-11-23 NOTE — Progress Notes (Signed)
[facility] [place] NAME: [name]    MR#:  [id number]  DATE OF BIRTH:  08-15-1955  SUBJECTIVE:   No acute events overnight, patient tolerating his tube feeds.  No other complaints presently.  Patient nonverbal at baseline.  Awaiting placement.  REVIEW OF SYSTEMS:    Review of Systems  Unable to perform ROS: Mental acuity    Nutrition: tube feeds Tolerating Diet: yes Tolerating PT: bedbound at baseline.   DRUG ALLERGIES:   Allergies  Allergen Reactions  . Metformin Diarrhea    VITALS:  Blood pressure 126/70, pulse 95, temperature 98.6 F (37 C), temperature source Oral, resp. rate 16, height 6' (1.829 m), weight 98 kg, SpO2 94 %.  PHYSICAL EXAMINATION:   Physical Exam  GENERAL:  63 y.o.-year-old patient lying in bed in NAD.  EYES: Pupils equal, round, reactive to light. No scleral icterus. Extraocular muscles intact.  HEENT: Head atraumatic, normocephalic. Oropharynx and nasopharynx clear.  NECK:  Supple, no jugular venous distention. No thyroid enlargement, no tenderness.  LUNGS: Poor Resp. effort, no wheezing, rales, rhonchi. No use of accessory muscles of respiration.  CARDIOVASCULAR: S1, S2 normal. No murmurs, rubs, or gallops.  ABDOMEN: Soft, nontender, nondistended. Bowel sounds present. No organomegaly or mass.  EXTREMITIES: No cyanosis, clubbing or edema b/l.    NEUROLOGIC: Cranial nerves II through XII are intact. No focal Motor or sensory deficits b/l. Globally weak and bedbound  PSYCHIATRIC: The patient is alert and oriented x 1.  SKIN: No obvious rash, lesion, or ulcer.    LABORATORY PANEL:   CBC Recent Labs  Lab 11/23/18 0452  WBC 15.2*  HGB 10.2*  HCT 32.6*  PLT 585*   ------------------------------------------------------------------------------------------------------------------  Chemistries  Recent Labs  Lab 11/23/18 0452  NA 139  K 3.8  CL 103  CO2 27  GLUCOSE 137*  BUN 30*   CREATININE 0.53*  CALCIUM 8.7*   ------------------------------------------------------------------------------------------------------------------  Cardiac Enzymes No results for input(s): TROPONINI in the last 168 hours. ------------------------------------------------------------------------------------------------------------------  RADIOLOGY:  No results found.   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  1. Acute encephalopathy- suspected secondary to hypoglycemia when pt presented to the hospital - pt required mechanical ventilation initially -Has bilateral encephalomalacia based on CT head. Hx of left frontal craniotomy done.  Prior history of strokes. -Has right anterior cerebral artery aneurysm embolization in the past and clipping recently this year. - MRI cannot be done due to recent clipping. -Appreciate neurology consult.  Provigil dose has been increased without significant change.  Remains alert but does not following commands and likely pt's baseline now. -S/P PEG placement on June 5th 2020-PEG feeding going well  2. Sepsis-secondary to urinary tract infection.  All cultures have been negative.  Off pressors.  Off steroids. --Finished broad-spectrum antibiotics  3.  Acute renal failure-secondary to acute urinary retention with history of BPH -Appreciate nephrology consult.  Bilateral hydronephrosis on renal ultrasound. - Status post Foley cath -Renal function is normalized at this time  4. Poor oral intake/Nutrition - now s/p PEG tube placemen-tube feeds now.  5.  Seizure prevention -prophylactically on Keppra.  - Pt was denied LTAC services per CSW. He was from [facility]. CSW working with dter for SNF d/c planning.  -Dr Posey Pronto had  peer to peer review with Dr. Aris Georgia with patient's insurance company. To him reviewing at physical therapy notes prior to patient being admitted here he was not performing very well. Patient was wheelchair-bound as well. And given this hospitalization and current mental status he does not foresee patient improving with physical therapy. Patient is at goal rate for his peg feeding. He recommends long-term care. Unfortunately patient does not have long-term care plan under his insurance.  Follw up with Education officer, museum and family PT & OT recommend SNF/LTACH Social worker f/u   patient's daughter requested to transfer to [facility] for second opinion as he had his care in past at [facility].  This was done by the prior hospitalist and [facility] did not accept the patient's transfer as there is no acute medical need for transfer presently.  Social work still working on placement.      All the records are reviewed and case discussed with Care Management/Social Worker. Management plans discussed with the patient, family and they are in agreement.  CODE STATUS: Full code  DVT Prophylaxis: Lovenox  TOTAL TIME TAKING CARE OF THIS PATIENT: 30 minutes.   POSSIBLE D/C unclear DAYS, DEPENDING ON CLINICAL CONDITION and progress.   Henreitta Leber M.D on 11/23/2018 at 2:54 PM  Between 7am to 6pm - Pager - [phone]  After 6pm go to [email or web address] - Proofreader  [employer] [facility] [place]  Office  [phone]  CC: Primary care physician; Garwin Brothers, MD

## 2018-11-23 NOTE — Plan of Care (Signed)

## 2018-11-23 NOTE — Progress Notes (Signed)
No acute changes. Pt resting quietly; less active with arm movements. Tolerating feedings/vs's unchanged. Foley continued with yellow cloudy urine with odor. Pressure injury care performed with pt turned/respositioned every 2 hours. Ortho splint off and reapplied.

## 2018-11-24 LAB — URINALYSIS, ROUTINE W REFLEX MICROSCOPIC
Bacteria, UA: NONE SEEN
Bilirubin Urine: NEGATIVE
Glucose, UA: NEGATIVE mg/dL
Hgb urine dipstick: NEGATIVE
Ketones, ur: NEGATIVE mg/dL
Nitrite: NEGATIVE
Protein, ur: 100 mg/dL — AB
Specific Gravity, Urine: 1.014 (ref 1.005–1.030)
Squamous Epithelial / LPF: NONE SEEN (ref 0–5)
WBC, UA: >50 WBC/hpf — ABNORMAL HIGH (ref 0–5)
pH: 9 — ABNORMAL HIGH (ref 5.0–8.0)

## 2018-11-24 NOTE — Progress Notes (Signed)
Advised MD urine more cloudy with thick purulent like sediment; low grade intermittent fevers with orders obtained for UA w C&S. RN went to room to collect urine with no urinary drainage in foley tubing with pooling of urine inbetween legs- foley obstructed. MD notified with order obtained to change foley. Old foley removed with immediate voiding of cloudy yellow urine large amount. New foley inserted with stat return of 450 residual urine which was yellow, cloudy, large amount of sediment/purulent like appearance. Total skin care and linen change performed. 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 Pt repositioned to left side.

## 2018-11-24 NOTE — Progress Notes (Signed)
Urine resent to lab when no results populated.  Foley draining well. Toleraing feedings. Pt is working ortho splints and taking them off at intervals/replaced. PT has ordered boots from [facility]. Turning/repositioning pt with frequent skin care performed.

## 2018-11-24 NOTE — Progress Notes (Addendum)
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 who presented from his facility unresponsive requiring him to be ventilated in the ED. Patient has been seen in previous years at behavioral health for suicide ideation.     PT Comments    Pt non verbal, but able to nod head "yes" in agreement to PT once pt awakened. PROM performed on B ankle, hip, shoulder and elbow joints with attempts at B knee joints. Overall, pt demonstrates rigid/stiff extremities with little to no movement attained at B knees or L elbow extension from 90 degrees flexion to full extension. Pt initially in internally rotated and sh/elbow flexed position on R as well, but able to tolerate resting position of shoulder neutral and full elbow extension post session. LUE positioned with towel roll for improved shoulder neutral rotation and 90 degrees elbow flexion. Pillow placed under B calves to alleviate pressure to B heels. Discussed multipodus boots with nursing; ordered, but need to receive from [facility]. Continue PT to progress range all joints to improve positioning, ease of assisted mobility and prevent contractures.   Follow Up Recommendations  SNF;LTACH     Equipment Recommendations  None recommended by PT    Recommendations for Other Services       Precautions / Restrictions Precautions Precautions: Fall;Other (comment) Precaution Comments: high risk of BUE/BLE contractures Restrictions Weight Bearing Restrictions: No(Bed bound; hoyer )    Mobility  Bed Mobility                  Transfers                    Ambulation/Gait                 Stairs             Wheelchair  Mobility    Modified Rankin (Stroke Patients Only)       Balance                                            Cognition Arousal/Alertness: Lethargic Behavior During Therapy: Flat affect Overall Cognitive Status: Difficult to assess Area of Impairment: Following commands                               General Comments: All range performed passively where able. Rigid through B knees and L elbow.       Exercises General Exercises - Upper Extremity Shoulder Flexion: PROM;Both;15 reps Elbow Flexion: PROM;Both;15 reps(L demonstrates 90 - 180 of flexion only) Elbow Extension: PROM;Right;15 reps(no ext in L beyond 90 of flexion; attempted gentle pressure) General Exercises - Lower Extremity Ankle Circles/Pumps: PROM;Both;15 reps(performed individually) Heel Slides: Other (comment)(attempted little to no flexion attained at knees; rigid) Hip ABduction/ADduction: PROM;Both;15 reps Straight Leg Raises: PROM;Both;15 reps Other Exercises Other Exercises: B shoulder IR/ER x 15 each Other Exercises: Replaced R hand/thumb into R splint  Other Exercises: discussed boots for pt with nursing    General Comments        Pertinent Vitals/Pain Pain Assessment: No/denies  pain(shakes head no) Pain Intervention(s): Limited activity within patient's tolerance;Other (comment)(voluntary versus involuntary resist to range, knees/L elbow)    Home Living                      Prior Function            PT Goals (current goals can now be found in the care plan section) Progress towards PT goals: Not progressing toward goals - comment    Frequency    Min 2X/week      PT Plan Current plan remains appropriate    Co-evaluation              AM-PAC PT "6 Clicks" Mobility   Outcome Measure  Help needed turning from your back to your side while in a flat bed without using bedrails?: Total Help needed moving from lying on your back to sitting on the  side of a flat bed without using bedrails?: Total Help needed moving to and from a bed to a chair (including a wheelchair)?: Total Help needed standing up from a chair using your arms (e.g., wheelchair or bedside chair)?: Total Help needed to walk in hospital room?: Total Help needed climbing 3-5 steps with a railing? : Total 6 Click Score: 6    End of Session   Activity Tolerance: Patient tolerated treatment well Patient left: in bed;with call bell/phone within reach;with bed alarm set   PT Visit Diagnosis: Unsteadiness on feet (R26.81);Other abnormalities of gait and mobility (R26.89);Muscle weakness (generalized) (M62.81);Difficulty in walking, not elsewhere classified (R26.2);Other symptoms and signs involving the nervous system (R29.898)     Time: 1975-8832 PT Time Calculation (min) (ACUTE ONLY): 30 min  Charges:  $Therapeutic Exercise: 23-37 mins                      Larae Grooms, PTA 11/24/2018, 1:43 PM

## 2018-11-24 NOTE — Progress Notes (Signed)
[facility] [place] NAME: [name]    MR#:  [id number]  Marina:  1955-07-26  SUBJECTIVE:   Patient had a low-grade fever today and patient's urine was showing increased sediment and therefore Foley catheter was changed this afternoon.  Repeat urinalysis and urine cultures were ordered.  REVIEW OF SYSTEMS:    Review of Systems  Unable to perform ROS: Mental acuity    Nutrition: tube feeds Tolerating Diet: yes Tolerating PT: bedbound at baseline.   DRUG ALLERGIES:   Allergies  Allergen Reactions  . Metformin Diarrhea    VITALS:  Blood pressure (!) 157/99, pulse (!) 109, temperature 98.7 F (37.1 C), temperature source Oral, resp. rate (!) 24, height 6' (1.829 m), weight 101 kg, SpO2 94 %.  PHYSICAL EXAMINATION:   Physical Exam  GENERAL:  63 y.o.-year-old patient lying in bed in NAD.  EYES: Pupils equal, round, reactive to light. No scleral icterus. Extraocular muscles intact.  HEENT: Head atraumatic, normocephalic. Oropharynx and nasopharynx clear.  NECK:  Supple, no jugular venous distention. No thyroid enlargement, no tenderness.  LUNGS: Poor Resp. effort, no wheezing, rales, rhonchi. No use of accessory muscles of respiration.  CARDIOVASCULAR: S1, S2 normal. No murmurs, rubs, or gallops.  ABDOMEN: Soft, nontender, nondistended. Bowel sounds present. No organomegaly or mass.  EXTREMITIES: No cyanosis, clubbing or edema b/l.    NEUROLOGIC: Cranial nerves II through XII are intact. No focal Motor or sensory deficits b/l. Globally weak and bedbound and contracted upper and lower ext.  PSYCHIATRIC: The patient is alert and oriented x 1.  SKIN: No obvious rash, lesion, or ulcer.    LABORATORY PANEL:   CBC Recent Labs  Lab 11/23/18 0452  WBC 15.2*  HGB 10.2*  HCT 32.6*  PLT 585*   ------------------------------------------------------------------------------------------------------------------  Chemistries   Recent Labs  Lab 11/23/18 0452  NA 139  K 3.8  CL 103  CO2 27  GLUCOSE 137*  BUN 30*  CREATININE 0.53*  CALCIUM 8.7*   ------------------------------------------------------------------------------------------------------------------  Cardiac Enzymes No results for input(s): TROPONINI in the last 168 hours. ------------------------------------------------------------------------------------------------------------------  RADIOLOGY:  No results found.   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  1. Acute encephalopathy- suspected secondary to hypoglycemia when pt presented to the hospital - pt required mechanical ventilation initially -Has bilateral encephalomalacia based on CT head. Hx of left frontal craniotomy done.  Prior history of strokes. -Has right anterior cerebral artery aneurysm embolization in the past and clipping recently this year. - MRI cannot be done due to recent clipping. -Appreciate neurology consult.   - cont. Provigil and dose has been increased without significant change.  Remains alert but does not following commands and likely pt's baseline now. -S/P PEG placement on June 5th 2020-PEG feeding going well  2. Sepsis-secondary to urinary tract infection.  All cultures have been negative.  Off pressors.  Off steroids. --Finished broad-spectrum antibiotics  3.  Acute renal failure-secondary to acute urinary retention with history of BPH -Appreciate nephrology consult.  Bilateral hydronephrosis on renal ultrasound. - Status post Foley cath -Renal function is normalized at this time  4. Poor oral intake/Nutrition - now s/p PEG tube placemen-tube feeds now.  5.  Seizure prevention -prophylactically on  Keppra.  6. Low grade  fever - source unclear and has had previous sepsis due to UTI.  - Foley cath was leaking and urine noted to have increasing sediment.  - will repeat UA, Urine culture and monitor fever curve.   - Pt was denied LTAC services per CSW. He was from [facility]. CSW working with dter for SNF d/c planning. -Dr Posey Pronto had peer to peer review with Dr. Aris Georgia with patient's insurance company. To him reviewing at physical therapy notes prior to patient being admitted here he was not performing very well. Patient was wheelchair-bound as well. And given this hospitalization and current mental status he does not foresee patient improving with physical therapy. Patient is at goal rate for his peg feeding. He recommends long-term care. Unfortunately patient does not have long-term care plan under his insurance.  Follw up with Education officer, museum and family PT & OT recommend SNF/LTACH   patient's daughter requested to transfer to [facility] for second opinion as he had his care in past at [facility].  This was done by the prior hospitalist and [facility] did not accept the patient's transfer as there is no acute medical need for transfer presently.  Social work still working on placement.      All the records are reviewed and case discussed with Care Management/Social Worker. Management plans discussed with the patient, family and they are in agreement.  CODE STATUS: Full code  DVT Prophylaxis: Lovenox  TOTAL TIME TAKING CARE OF THIS PATIENT: 30 minutes.   POSSIBLE D/C unclear DAYS, DEPENDING ON CLINICAL CONDITION and progress.   Henreitta Leber M.D on 11/24/2018 at 12:59 PM  Between 7am to 6pm - Pager - [phone]  After 6pm go to [email or web address] - Proofreader  [facility] [place] [facility]  Office  [phone]  CC: Primary care physician; Garwin Brothers, MD

## 2018-11-25 LAB — CBC
HCT: 30 % — ABNORMAL LOW (ref 39.0–52.0)
Hemoglobin: 9.4 g/dL — ABNORMAL LOW (ref 13.0–17.0)
MCH: 27.9 pg (ref 26.0–34.0)
MCHC: 31.3 g/dL (ref 30.0–36.0)
MCV: 89 fL (ref 80.0–100.0)
Platelets: 520 10*3/uL — ABNORMAL HIGH (ref 150–400)
RBC: 3.37 MIL/uL — ABNORMAL LOW (ref 4.22–5.81)
RDW: 18.1 % — ABNORMAL HIGH (ref 11.5–15.5)
WBC: 14.5 10*3/uL — ABNORMAL HIGH (ref 4.0–10.5)
nRBC: 0 % (ref 0.0–0.2)

## 2018-11-25 LAB — BASIC METABOLIC PANEL
Anion gap: 9 (ref 5–15)
BUN: 27 mg/dL — ABNORMAL HIGH (ref 8–23)
CO2: 27 mmol/L (ref 22–32)
Calcium: 8.6 mg/dL — ABNORMAL LOW (ref 8.9–10.3)
Chloride: 105 mmol/L (ref 98–111)
Creatinine, Ser: 0.54 mg/dL — ABNORMAL LOW (ref 0.61–1.24)
GFR calc Af Amer: >60 mL/min (ref >60)
GFR calc non Af Amer: >60 mL/min (ref >60)
Glucose, Bld: 143 mg/dL — ABNORMAL HIGH (ref 70–99)
Potassium: 3.8 mmol/L (ref 3.5–5.1)
Sodium: 141 mmol/L (ref 135–145)

## 2018-11-25 MED ORDER — PRO-STAT SUGAR FREE PO LIQD
30.0000 mL | Freq: Two times a day (BID) | ORAL | Status: DC
  Administered 2018-11-25 – 2018-11-28 (×7): 30 mL

## 2018-11-25 MED ORDER — SODIUM CHLORIDE 0.9 % IV SOLN
1.0000 g | INTRAVENOUS | Status: DC
  Administered 2018-11-25: 1 g via INTRAVENOUS
  Filled 2018-11-25: qty 1
  Filled 2018-11-25: qty 10

## 2018-11-25 MED ORDER — SODIUM CHLORIDE 0.9 % IV SOLN
INTRAVENOUS | Status: DC | PRN
  Administered 2018-11-25: 250 mL via INTRAVENOUS

## 2018-11-25 NOTE — Evaluation (Signed)
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 who presented from his facility unresponsive requiring him to be ventilated in the ED. Patient has been seen in previous years at behavioral health for suicide ideation.   Clinical Impression  Patient awake and alert throughout session this date.  Consistently tracking therapist with eyes on both sides of the room, visually attending to ROM efforts and noxious stimuli in extremities.  Spontaneously mobilizes bilat UEs to touch face multiples times during session, but displays significant difficulty purposefully following verbal commands.  Does assist with UE tasks with hand-over-hand assist to initiate and guide movement; limited ability to continue task once external support removed.  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  Also noted with significant tone throughout all extremities; may benefit from trial of tone-reducing medication (will discuss with MD).  Bilat ankles contracted in PF position (L > R); order for multipodus boot received (will trial and fit in subsequent session).  Able to achieve hip flexion approx 40 degrees, bilat knee flexion approx 50-60 degrees with stretching, positioning and tone reduction techniques.  Currently dep for all bed mobility and repositioning; unsupported sitting or OOB not attempted this session (though dep on hoyer lift for transfers in previous sessions).  Will continue to assess/progress as able. Goals from initial assessment remain appropriate for  patient. Would benefit from skilled PT to address above deficits and promote optimal return to PLOF; recommend transition to STR upon discharge from acute hospitalization.     Follow Up Recommendations SNF    Equipment Recommendations       Recommendations for Other Services       Precautions / Restrictions Precautions Precautions: Fall;Other (comment) Precaution Comments: NPO/PEG, non-verbal Restrictions Weight Bearing Restrictions: No Other Position/Activity Restrictions: bilat resting hand splints on/off - continue to assess for optimal positioning/fitting, skin integrity      Mobility  Bed Mobility Overal bed mobility: Needs Assistance   Rolling: Total assist         General bed mobility comments: total assist for all repositioning, pressure relief and any attempts at mobility  Transfers Overall transfer level: Needs assistance               General transfer comment: unsafe/unable; plan to attempt transition to sitting next session  Ambulation/Gait             General Gait Details: unsafe/unable  Stairs            Wheelchair Mobility    Modified Rankin (Stroke Patients Only)       Balance                                             Pertinent Vitals/Pain Pain Assessment: Faces Faces Pain Scale: Hurts even more Pain Location: LE's with PROM and gentle stretching with manual cues to increase stretch Pain Descriptors / Indicators: Grimacing;Moaning Pain Intervention(s): Limited activity within patient's tolerance;Monitored during session;Repositioned    Home Living Family/patient expects to be discharged to:: Skilled nursing facility  Additional Comments: Per previous documentation: Patient admitted from SNF facility. Was admitted to [facility] after head tracheostomy then went to [facility] then went to a skilled nursing    Prior Function Level of Independence: Needs assistance   Gait / Transfers  Assistance Needed: Per previous documentation patient was primarily w/c bound but with extensive PT he had began to stand independently   ADL's / Homemaking Assistance Needed: Per previous documentation patient was able to feed himself  Comments: Per previous documentation patient's baseline is alert and oriented, able to feed self, self propel wheelchair and recently began standing independently. Patient unable to provide information; family not available.     Hand Dominance   Dominant Hand: Right    Extremity/Trunk Assessment   Upper Extremity Assessment Upper Extremity Assessment: (spontaneous movement of bilat UEs noted grossly 3-/5 at most joints; prefers flexed resting position.  Significant apraxia noted; difficulty with appropriate initiation/termination of muscle activity)    Lower Extremity Assessment Lower Extremity Assessment: (wiggles toes on R foot, but demonstrates no other purposeful, active movement in bilat LEs.  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  Bilat ankles in PF contracture (L > R) with mod tone (3/4 ashworth scale) thorughout bilat LEs)       Communication   Communication: (non-verbal at baseline)  Cognition Arousal/Alertness: Awake/alert Behavior During Therapy: Flat affect Overall Cognitive Status: No family/caregiver present to determine baseline cognitive functioning                                 General Comments: visually tracks therapist and localizes external stimuli throughout session; requries hand-over-hand for initiation of purposeful activity. Does not respond to isolated verbal commands, intermittent response to gestures      General Comments General comments (skin integrity, edema, etc.): Bilat resting hand splints removed to support skin integrity and allow for more freedom of movement    Exercises Other Exercises Other Exercises: UE stretching/ROM-see OT note Other Exercises: LE streching/ROM as tolerated.  Significant apraxia  and sustained muscle activation noted bilat LEs;  Does relax and allow increased ROM with gentle stretching, contract/release exercises and manual facilitation throughout.  Bilat knees to approx 50-60 degrees flexion, bilat hips to approx 40 degrees flex with passive stretching.  Intermittent grimacing with stretching, but resolves with muscle relaxation.   Assessment/Plan    PT Assessment Patient needs continued PT services  PT Problem List Decreased strength;Decreased range of motion;Decreased activity tolerance;Decreased balance;Decreased knowledge of use of DME;Decreased cognition;Decreased coordination;Decreased mobility;Decreased safety awareness;Impaired tone       PT Treatment Interventions DME instruction;Functional mobility training;Therapeutic activities;Therapeutic exercise;Neuromuscular re-education;Gait training;Balance training;Cognitive remediation;Manual techniques;Wheelchair mobility training;Patient/family education    PT Goals (Current goals can be found in the Care Plan section)  Acute Rehab PT Goals Patient Stated Goal: patient unable PT Goal Formulation: Patient unable to participate in goal setting Time For Goal Achievement: 12/09/18 Potential to Achieve Goals: Fair    Frequency Min 2X/week   Barriers to discharge        Co-evaluation   Reason for Co-Treatment: Complexity of the patient's impairments (multi-system involvement)   OT goals addressed during session: ADL's and self-care       AM-PAC PT "6 Clicks" Mobility  Outcome Measure Help needed turning from your back to your side while in a flat bed without using bedrails?: Total Help needed moving from lying on your back to sitting on the side of a flat bed without using bedrails?: Total  Help needed moving to and from a bed to a chair (including a wheelchair)?: Total Help needed standing up from a chair using your arms (e.g., wheelchair or bedside chair)?: Total Help needed to walk in hospital room?:  Total Help needed climbing 3-5 steps with a railing? : Total 6 Click Score: 6    End of Session   Activity Tolerance: Patient tolerated treatment well Patient left: in bed;with bed alarm set;with call bell/phone within reach Nurse Communication: Mobility status PT Visit Diagnosis: Unsteadiness on feet (R26.81);Other abnormalities of gait and mobility (R26.89);Muscle weakness (generalized) (M62.81);Difficulty in walking, not elsewhere classified (R26.2);Other symptoms and signs involving the nervous system (R29.898)    Time: 4742-5956 PT Time Calculation (min) (ACUTE ONLY): 35 min   Charges:   PT Evaluation $PT Re-evaluation: 1 Re-eval          Marshayla Mitschke H. Owens Shark, PT, DPT, NCS 11/25/18, 7:32 PM [phone]

## 2018-11-25 NOTE — Plan of Care (Signed)

## 2018-11-25 NOTE — Progress Notes (Signed)
[name] at [place] NAME: [name]    MR#:  [id number]  DATE OF BIRTH:  03/17/1956  SUBJECTIVE:   Afebrile overnight, urine cultures positive for 100,000 colonies of gram-negative.  No other acute events overnight.  Awaiting placement.  REVIEW OF SYSTEMS:    Review of Systems  Unable to perform ROS: Mental acuity    Nutrition: tube feeds Tolerating Diet: yes Tolerating PT: bedbound at baseline.   DRUG ALLERGIES:   Allergies  Allergen Reactions  . Metformin Diarrhea    VITALS:  Blood pressure 123/69, pulse (!) 102, temperature 99.1 F (37.3 C), temperature source Axillary, resp. rate 16, height 6' (1.829 m), weight 102 kg, SpO2 99 %.  PHYSICAL EXAMINATION:   Physical Exam  GENERAL:  63 y.o.-year-old patient lying in bed in NAD.  EYES: Pupils equal, round, reactive to light. No scleral icterus. Extraocular muscles intact.  HEENT: Head atraumatic, normocephalic. Oropharynx and nasopharynx clear.  NECK:  Supple, no jugular venous distention. No thyroid enlargement, no tenderness.  LUNGS: Poor Resp. effort, no wheezing, rales, rhonchi. No use of accessory muscles of respiration.  CARDIOVASCULAR: S1, S2 normal. No murmurs, rubs, or gallops.  ABDOMEN: Soft, nontender, nondistended. Bowel sounds present. No organomegaly or mass.  EXTREMITIES: No cyanosis, clubbing or edema b/l.    NEUROLOGIC: Cranial nerves II through XII are intact. No focal Motor or sensory deficits b/l. Globally weak and bedbound and contracted upper and lower ext.  PSYCHIATRIC: The patient is alert and oriented x 1.  SKIN: No obvious rash, lesion, or ulcer.    LABORATORY PANEL:   CBC Recent Labs  Lab 11/25/18 0418  WBC 14.5*  HGB 9.4*  HCT 30.0*  PLT 520*   ------------------------------------------------------------------------------------------------------------------  Chemistries  Recent Labs  Lab 11/25/18 0418  NA 141  K 3.8  CL 105   CO2 27  GLUCOSE 143*  BUN 27*  CREATININE 0.54*  CALCIUM 8.6*   ------------------------------------------------------------------------------------------------------------------  Cardiac Enzymes No results for input(s): TROPONINI in the last 168 hours. ------------------------------------------------------------------------------------------------------------------  RADIOLOGY:  No results found.   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  1. Acute encephalopathy- suspected secondary to hypoglycemia when pt presented to the hospital - pt required mechanical ventilation initially -Has bilateral encephalomalacia based on CT head. Hx of left frontal craniotomy done.  Prior history of strokes. -Has right anterior cerebral artery aneurysm embolization in the past and clipping recently this year. - MRI cannot be done due to recent clipping. -Appreciate neurology consult.   - cont. Provigil and dose has been increased without significant change.  Remains alert but does not following commands and likely pt's baseline now. -S/P PEG placement on June 5th 2020-PEG feeding going well  2.  UTI - pt. urine cultures are positive for 100,000 colonies of gram-negative rod. - Urinalysis also positive yesterday.  We will start IV ceftriaxone today.  3.  Acute renal failure-secondary to acute urinary retention with history of BPH -Appreciate nephrology consult.  Bilateral hydronephrosis on renal ultrasound. - Status post Foley cath -Renal function is normalized at this time  4. Poor oral intake/Nutrition - now s/p PEG tube placemen-tube feeds now.  5.  Seizure prevention - cont. Keppra.  6. Low grade fever -  Suspected to be due  to UTI.  -  cont. Care as mentioned above and will start IV ceftriaxone. Afebrile today.   - Pt was denied LTAC services per CSW. He was from [facility]. [facility] refused [facility] which [facility].  Await further input from social work regarding Disposition.    All the records are reviewed and case discussed with Care Management/Social Worker. Management plans discussed with the patient, family and they are in agreement.  CODE STATUS: Full code  DVT Prophylaxis: Lovenox  TOTAL TIME TAKING CARE OF THIS PATIENT: 30 minutes.   POSSIBLE D/C unclear DAYS, DEPENDING ON CLINICAL CONDITION and progress.   Henreitta Leber M.D on 11/25/2018 at 1:48 PM  Between 7am to 6pm - Pager - [phone]  After 6pm go to [email or web address] - Proofreader  [facility]  Office  [phone]  CC: Primary care physician; Garwin Brothers, MD

## 2018-11-25 NOTE — Progress Notes (Signed)
Nutrition Follow-up  DOCUMENTATION CODES:   Obesity unspecified  INTERVENTION:  Continue Osmolite 1.5 Cal at goal rate of 60 mL/hr + Pro-Stat 30 mL BID per tube. Provides 2360 kcal, 120 grams of protein, 1094 mL H2O daily.  Continuefree water flush of150 mL Q4hrs. This provides a total of 1994 mL H2O daily including water in tube feeding.  Goal regimen meets 100% RDIs for vitamins/minerals.  Continue Juven BID per tube to promote wound healing.  NUTRITION DIAGNOSIS:   Inadequate oral intake related to inability to eat as evidenced by NPO status.  Ongoing - addressing with TF regimen.  GOAL:   Patient will meet greater than or equal to 90% of their needs  Met with TF regimen.  MONITOR:   Diet advancement, Labs, Weight trends, I & O's, Skin  REASON FOR ASSESSMENT:   Consult Enteral/tube feeding initiation and management  ASSESSMENT:   63 year old male with PMHx of HTN, HLD, depression, anxiety, hx CVA, hx drug abuse admitted with acute mental status change in setting of prior cerebral hemorrhage and CVA and requiring intubation on 5/23 for airway protection, also with urinary retention with acute renal failure.  Attempted to meet with patient in room but he was sleeping. Tube feeds infusing at goal rate. Discussed with RN. Patient continues to tolerate goal TF regimen. Skin is worsening. There was a question about Pro-Stat discontinuation. It was only once daily instead of BID so will update order. Still pending discharge plan.  Enteral Access: 20 Fr. Bard gastrostomy tube placed 6/5 by GI; external bumper at 4.5 cm marking  TF: Osmolite at 60 mL/hr + Pro-Stat 30 mL BID + FWF 150 mL Q4hrs  Medications reviewed and include: carvedilol, famotidine, Keppra.  Labs reviewed: BUN 27, Creatinine 0.54.  I/O: 1500 mL UOP Yesterday (0.6 mL/kg/hr)  Weight trend: 102 kg on 6/29; +2.7 kg from 6/23  Diet Order:   Diet Order    None     EDUCATION NEEDS:   No  education needs have been identified at this time  Skin:  Skin Assessment: Skin Integrity Issues:(stg II sacrum (3cm x 3cm))  Last BM:  11/24/2018 - large type 5  Height:   Ht Readings from Last 1 Encounters:  11/01/18 6' (1.829 m)   Weight:   Wt Readings from Last 1 Encounters:  11/25/18 102 kg   Ideal Body Weight:  80.9 kg  BMI:  Body mass index is 30.5 kg/m.  Estimated Nutritional Needs:   Kcal:  2300-2600kcal/day   Protein:  115-130g/day   Fluid:  2 L/day (25 mL/kg IBW)  Willey Blade, MS, RD, LDN Office: [phone] Pager: [phone] After Hours/Weekend Pager: [phone]

## 2018-11-25 NOTE — Progress Notes (Signed)
Palliative Note:  Palliative have been shadowing and following up with family. Goals are clear daughter continues to request full scope treatment and assistance with disposition needs. No further role for Palliative at this time. If patient condition changes or further need for re-engagement with family, please re-consult.   Palliative will sign-off at this time. Thank you for allowing [place] to be involved in patient's care.   Alda Lea, AGPCNP-BC Palliative Medicine Team  Phone: [phone]  NO CHARGE

## 2018-11-25 NOTE — Evaluation (Addendum)
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 who presented from his facility unresponsive requiring him to be ventilated in the ED. Patient has been seen in previous years at behavioral health for suicide ideation.    Clinical Impression   Pt seen for OT re-evaluation and co-tx this date. Pt able to visually track therapist at times, turns head to verbal cues. Following commands intermittently during session. Significant apraxia and increased tone noted throughout BUE and BLE but with repetition, gentle PROM/stretching, and verbal cues pt able to terminate muscle contraction to allow LUE to rest on pillow. Pt able to wash face with washcloth requiring max assist and cues to initiate. RN in room to start IV antibiotics. Pt continues to benefit from skilled OT services to maximize return to PLOF, minimize risk of contracture, functional decline, and caregiver burden. Goals updated. Discharge recommendation remains appropriate; frequency updated.    Follow Up Recommendations  [facility]    Equipment Recommendations  Hospital bed;Other (comment)(hoyer lift)    Recommendations for Other Services       Precautions / Restrictions Precautions Precautions: Fall;Other (comment) Precaution Comments: high risk of BUE/BLE contractures Restrictions Weight Bearing Restrictions: No Other Position/Activity Restrictions: bilat resting hand splints on/off - continue to assess for optimal positioning/fitting, skin integrity      Mobility Bed Mobility Overal bed mobility: Needs Assistance             General bed mobility comments: Max +2 for rolling  Transfers Overall transfer level: Needs  assistance               General transfer comment: dep for transfers    Balance                                           ADL either performed or assessed with clinical judgement   ADL Overall ADL's : Needs assistance/impaired                                       General ADL Comments: Max-Total Assist for ADL     Vision Patient Visual Report: (unable to assess 2/2 cognition/impaired communication) Additional Comments: difficulty to formally assess, pt does visually track therapist in room     Perception     Praxis      Pertinent Vitals/Pain Pain Assessment: Faces Faces Pain Scale: Hurts even more Pain Location: LE's with PROM and gentle stretching with manual cues to increase stretch Pain Descriptors / Indicators: Grimacing;Moaning Pain Intervention(s): Limited activity within patient's tolerance;Monitored during session;Repositioned     Hand Dominance Right   Extremity/Trunk Assessment Upper Extremity Assessment Upper Extremity Assessment: (increased tone noted throughout BUE, L>R, LUE w/ clonus)   Lower Extremity Assessment Lower Extremity Assessment: Defer to PT evaluation(increased tone bilat, decr DF)       Communication Communication Communication: HOH;Other (comment)(pt not verbally communicating much)   Cognition Arousal/Alertness: Awake/alert Behavior During Therapy: Flat affect Overall Cognitive Status: No family/caregiver present to determine baseline cognitive functioning  General Comments: pt will visually track therapist in room and follows commands with cues approx 50% of the time, impaired communication, per chart, HOH   General Comments  Bilat resting hand splints removed to support skin integrity and allow for more freedom of movement    Exercises Other Exercises Other Exercises: grooming with washcloth to wash face requiring max verbal cues, + time to  initiate, and mod-max assist to bring hand to face (L>R)   Shoulder Instructions      Home Living Family/patient expects to be discharged to:: Skilled nursing facility(pt unable to state, per chart family wants STR)                                 Additional Comments: Per previous documentation: Patient admitted from SNF facility. Was admitted to [facility] after head tracheostomy then went to [facility] then went to a skilled nursing      Prior Functioning/Environment Level of Independence: Needs assistance  Gait / Transfers Assistance Needed: Per previous documentation patient was primarily w/c bound but with extensive PT he had began to stand independently  ADL's / Homemaking Assistance Needed: Per previous documentation patient was able to feed himself   Comments: Per previous documentation patient's baseline is alert and oriented, able to feed self, self propel wheelchair and recently began standing independently         OT Problem List: Decreased strength;Decreased range of motion;Decreased coordination;Impaired tone;Decreased knowledge of use of DME or AE;Impaired vision/perception;Decreased activity tolerance;Decreased cognition;Impaired UE functional use;Impaired balance (sitting and/or standing);Decreased safety awareness      OT Treatment/Interventions: Self-care/ADL training;Balance training;Therapeutic exercise;Therapeutic activities;Neuromuscular education;DME and/or AE instruction;Cognitive remediation/compensation;Visual/perceptual remediation/compensation;Patient/family education;Manual therapy    OT Goals(Current goals can be found in the care plan section) Acute Rehab OT Goals OT Goal Formulation: Patient unable to participate in goal setting ADL Goals Pt Will Perform Grooming: with mod assist;with max assist;bed level Additional ADL Goal #1: Pt will utilize UE movement to indicate needs with use of communication aide as needed. Additional ADL Goal #2: Pt will  initiate UE extension with verbal cues and Mod manual cues in order to increase UE involvement in ADL tasks.  OT Frequency: Min 2X/week   Barriers to D/C:            Co-evaluation PT/OT/SLP Co-Evaluation/Treatment: Yes Reason for Co-Treatment: Complexity of the patient's impairments (multi-system involvement)   OT goals addressed during session: ADL's and self-care      AM-PAC OT "6 Clicks" Daily Activity     Outcome Measure Help from another person eating meals?: Total Help from another person taking care of personal grooming?: A Lot Help from another person toileting, which includes using toliet, bedpan, or urinal?: Total Help from another person bathing (including washing, rinsing, drying)?: Total Help from another person to put on and taking off regular upper body clothing?: Total Help from another person to put on and taking off regular lower body clothing?: Total 6 Click Score: 7   End of Session    Activity Tolerance: Patient tolerated treatment well Patient left: in bed;with call bell/phone within reach;with bed alarm set  OT Visit Diagnosis: Other abnormalities of gait and mobility (R26.89);Cognitive communication deficit (R41.841);Muscle weakness (generalized) (M62.81);Apraxia (R48.2)                Time: 4034-7425 OT Time Calculation (min): 35 min Charges:  OT General Charges $OT Visit: 1 Visit OT Evaluation $OT Re-eval: 1 Re-eval  Jeni Salles, MPH, MS, OTR/L ascom [phone] 11/25/18, 4:21 PM

## 2018-11-26 LAB — URINE CULTURE: Culture: >=100000 — AB

## 2018-11-26 LAB — SARS CORONAVIRUS 2 BY RT PCR (HOSPITAL ORDER, PERFORMED IN ~~LOC~~ HOSPITAL LAB): SARS Coronavirus 2: NEGATIVE

## 2018-11-26 MED ORDER — JUVEN PO PACK: 1.0000 | PACK | Freq: Two times a day (BID) | ORAL | 0 refills | Status: AC

## 2018-11-26 MED ORDER — MODAFINIL 200 MG PO TABS: 200.0000 mg | ORAL_TABLET | Freq: Two times a day (BID) | ORAL | Status: DC

## 2018-11-26 MED ORDER — BACLOFEN 10 MG PO TABS
10.0000 mg | ORAL_TABLET | Freq: Three times a day (TID) | ORAL | Status: DC
  Administered 2018-11-26 – 2018-11-28 (×8): 10 mg via ORAL
  Filled 2018-11-26 (×9): qty 1

## 2018-11-26 MED ORDER — POLYETHYLENE GLYCOL 3350 17 G PO PACK: 17.0000 g | PACK | Freq: Every day | ORAL | 0 refills | Status: AC | PRN

## 2018-11-26 MED ORDER — FINASTERIDE 5 MG PO TABS: 5.0000 mg | ORAL_TABLET | Freq: Every day | ORAL | Status: AC

## 2018-11-26 MED ORDER — OSMOLITE 1.5 CAL PO LIQD: 1000.0000 mL | ORAL | 0 refills | Status: AC

## 2018-11-26 MED ORDER — ORAL CARE MOUTH RINSE: 15.0000 mL | Freq: Two times a day (BID) | OROMUCOSAL | 0 refills | Status: AC

## 2018-11-26 MED ORDER — BACLOFEN 10 MG PO TABS: 10.0000 mg | ORAL_TABLET | Freq: Three times a day (TID) | ORAL | 0 refills | Status: AC

## 2018-11-26 MED ORDER — CEPHALEXIN 500 MG PO CAPS: 500.0000 mg | ORAL_CAPSULE | Freq: Four times a day (QID) | ORAL | 0 refills | Status: AC

## 2018-11-26 MED ORDER — PRO-STAT SUGAR FREE PO LIQD: 30.0000 mL | Freq: Two times a day (BID) | ORAL | 0 refills | Status: AC

## 2018-11-26 MED ORDER — FREE WATER: 150.0000 mL | Status: AC

## 2018-11-26 MED ORDER — CEFAZOLIN SODIUM-DEXTROSE 1-4 GM/50ML-% IV SOLN
1.0000 g | Freq: Three times a day (TID) | INTRAVENOUS | Status: DC
  Administered 2018-11-26 – 2018-11-28 (×7): 1 g via INTRAVENOUS
  Filled 2018-11-26 (×11): qty 50

## 2018-11-26 NOTE — TOC Progression Note (Addendum)
Transition of Care [facility]) - Progression Note    Patient Details  Name: [name] MRN: [id number] Date of Birth: 08/08/1955  Transition of Care [facility]) CM/SW Contact  Osborne Serio, Lenice Llamas Phone Number: [phone]  11/26/2018, 3:18 PM  Clinical Narrative: [place] authorization has been received today, authorization # [id number], approved for 3 days, next review date 7/2. Patient has 1 bed offer [facility]. Per [facility] admissions coordinator at [facility] they require another covid test. MD ordered rapid covid test. RN aware of above. Clinical Education officer, museum (CSW) attempted to contact patient's daughter [name] however she did not answer and a voicemail was left.   CSW received a call back from patient's daughter Aime and made her aware of above. Aime reported that she does not want [facility]. CSW explained that [facility] is the only bed offer patient has. CSW explained that patient will have to D/C home or [facility]. CSW provided emotional support. Per Aime she will have to talk to her family and get back to [place]. Aime asked about [facility]. Per [facility] admissions coordinator they can't accept patient. CSW will continue to follow and assist as needed.     Expected Discharge Plan: [place] Barriers to Discharge: No SNF bed, SNF Authorization Denied, Barriers Unresolved (comment)(Family plan to private pay for [facility]- no beds available until 6/18)  Expected Discharge Plan and Services Expected Discharge Plan: [place]                                               Social Determinants of Health (SDOH) Interventions    Readmission Risk Interventions Readmission Risk Prevention Plan 11/08/2018  Post Dischage Appt Complete  Medication Screening Complete  Transportation Screening Complete  Some recent data might be hidden

## 2018-11-26 NOTE — TOC Progression Note (Signed)
Transition of Care [facility]) - Progression Note    Patient Details  Name: NESBIT MICHON MRN: [id number] Date of Birth: Jan 26, 1956  Transition of Care [facility]) CM/SW Contact  Jaysion Ramseyer, Lenice Llamas Phone Number: [phone]  11/26/2018, 4:38 PM  Clinical Narrative:  Clinical Social Worker (CSW) contacted patient's daughter [name] and made her aware that patient's covid test is negative today. CSW explained to daughter that patient is medically stable for D/C and she will have to make a decision by tomorrow morning about going to [facility] or going home. Daughter verbalized her understanding. TOC lead at [facility] and [place] Surveyor, quantity are aware of above.      Expected Discharge Plan: [place] Barriers to Discharge: No SNF bed, SNF Authorization Denied, Barriers Unresolved (comment)(Family plan to private pay for [facility]- no beds available until 6/18)  Expected Discharge Plan and Services Expected Discharge Plan: [place]         Expected Discharge Date: 11/26/18                                     Social Determinants of Health (SDOH) Interventions    Readmission Risk Interventions Readmission Risk Prevention Plan 11/08/2018  Post Dischage Appt Complete  Medication Screening Complete  Transportation Screening Complete  Some recent data might be hidden

## 2018-11-26 NOTE — Progress Notes (Signed)
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 who presented from his facility unresponsive requiring him to be ventilated in the ED. Patient has been seen in previous years at behavioral health for suicide ideation.     PT Comments    Focused on B ankle heel-cord stretching into DF.  Increased tone noted L ankle vs R.  Donned multipodus boot on R side; discussed multipodus boot order with pt's nurse (and time donned on R side--also written on pt's white board); also discussed significant tone in L LE and possibility that it may not be appropriate to place boot on L d/t tone positioning L ankle into significant plantarflexion (even after stretching).  Will continue to focus on ROM and progressive mobility as able.   Follow Up Recommendations  SNF     Equipment Recommendations  None recommended by PT    Recommendations for Other Services OT consult     Precautions / Restrictions Precautions Precautions: Fall;Other (comment) Precaution Comments: NPO/PEG, non-verbal Restrictions Weight Bearing Restrictions: No Other Position/Activity Restrictions: bilat resting hand splints on/off - continue to assess for optimal positioning/fitting, skin integrity    Mobility  Bed Mobility                  Transfers                    Ambulation/Gait                 Stairs             Wheelchair Mobility    Modified Rankin (Stroke Patients Only)       Balance                                            Cognition Arousal/Alertness: Awake/alert Behavior During Therapy: Flat affect Overall Cognitive Status: No family/caregiver present to determine  baseline cognitive functioning Area of Impairment: Following commands                       Following Commands: Follows one step commands inconsistently              Exercises Other Exercises Other Exercises: B heelcord stretching 3x30 seconds    General Comments  Pt awake upon PT entering room.      Pertinent Vitals/Pain Faces Pain Scale: No hurt Pain Intervention(s): Limited activity within patient's tolerance;Monitored during session;Repositioned    Home Living                      Prior Function            PT Goals (current goals can now be found in the care plan section) Acute Rehab PT Goals Patient Stated Goal: patient unable PT Goal Formulation: Patient unable to participate in goal setting Time For Goal Achievement: 12/09/18 Potential to Achieve Goals: Fair Progress towards PT goals: Not progressing toward goals - comment    Frequency    Min 2X/week      PT Plan Current plan remains appropriate    Co-evaluation  AM-PAC PT "6 Clicks" Mobility   Outcome Measure  Help needed turning from your back to your side while in a flat bed without using bedrails?: Total Help needed moving from lying on your back to sitting on the side of a flat bed without using bedrails?: Total Help needed moving to and from a bed to a chair (including a wheelchair)?: Total Help needed standing up from a chair using your arms (e.g., wheelchair or bedside chair)?: Total Help needed to walk in hospital room?: Total Help needed climbing 3-5 steps with a railing? : Total 6 Click Score: 6    End of Session Equipment Utilized During Treatment: (R multipodus boot) Activity Tolerance: Patient tolerated treatment well Patient left: in bed;with bed alarm set;with call bell/phone within reach Nurse Communication: (Multipodus boot on at 1530 (and order in chart)) PT Visit Diagnosis: Unsteadiness on feet (R26.81);Other abnormalities of gait and  mobility (R26.89);Muscle weakness (generalized) (M62.81);Difficulty in walking, not elsewhere classified (R26.2);Other symptoms and signs involving the nervous system (N79.728)     Time: 2060-1561 PT Time Calculation (min) (ACUTE ONLY): 10 min  Charges:  $Therapeutic Exercise: 8-22 mins                    Leitha Bleak, PT 11/26/18, 4:55 PM [phone]

## 2018-11-26 NOTE — Discharge Summary (Signed)
[facility] [facility] [place] NAME: [name]    MR#:  [id number]  DATE OF BIRTH:  Feb 20, 1956  DATE OF ADMISSION:  10/19/2018 ADMITTING PHYSICIAN: Fritzi Mandes, MD  DATE OF DISCHARGE: 11/26/2018  PRIMARY CARE PHYSICIAN: Garwin Brothers, MD    ADMISSION DIAGNOSIS:  Acute urinary retention [R33.8] Acute renal failure, unspecified acute renal failure type ([place]) [N17.9] Altered mental status, unspecified altered mental status type [R41.82]  DISCHARGE DIAGNOSIS:  Active Problems:   Sepsis ([place])   Pressure injury of skin   SECONDARY DIAGNOSIS:   Past Medical History:  Diagnosis Date  . Allergy   . Anxiety   . Cataract   . Cerebral hemorrhage ([place])   . Chest pain, atypical    12/2003:  negative cardiolyte  . Depression   . Drug abuse ([place])   . Eczema   . Erectile dysfunction   . History of meniscal tear    bilateral  . HLD (hyperlipidemia)   . HTN (hypertension)   . Hx of tear of ACL (anterior cruciate ligament)    right  . Insomnia   . Olecranon bursitis of left elbow 10/2009   s/p I&D by Dr Maxie Better, initially assessed by Dr. Nori Riis   . Prediabetes   . Stroke ([place])   . Suicidal behavior 04/13/2017   Pt states taking a bunch of sleeping pills to end life   . 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  1. Acute encephalopathy- suspected secondary to hypoglycemia when pt presented to the hospital - pt required mechanical ventilation initially -Has bilateral encephalomalacia based on CT head. Hx of left frontal craniotomy done. Prior history of strokes. -Has right anterior cerebral artery aneurysm embolization in the past and clipping recently this year. - MRI cannot be  done due to recent clipping. - Seen by Neurology and no CVA on work up. Empirically on Keppra for seizures.  - started on Provigil and dose increased without significant change. Remains alert but does not following commands and likely pt's baseline now. -S/P PEG placement on June 5th 2020-PEG feeding being tolerated well.  -Patient has significant rigidity of his upper and lower extremities.  Started on some baclofen and will continue.  2.  UTI - pt. urine cultures are positive for 100,000 colonies of gram-negative rod. --Urine cultures are positive for Proteus.  Patient treated with IV ceftriaxone and Ancef and now being discharged on Keflex for 4 more days.  3. Acute renal failure-secondary to acute urinary retention with history of BPH -Appreciate nephrology consult. Bilateral hydronephrosis on renal ultrasound. - Status post Foley cath -Renal function has normalized and patient will continue Flomax, finasteride  4. Poor oral intake/Nutrition - now s/p PEG tube placement and tolerating tube feeds well.   5. Seizure prevention - cont. Keppra.  6. Low grade fever -  Suspected to be due to UTI.  - resolved now. Cont. abx as mentioned above.   DISCHARGE CONDITIONS:   Stable.   CONSULTS OBTAINED:  Treatment Team:  Leotis Pain, MD Alexis Goodell, MD  DRUG ALLERGIES:   Allergies  Allergen Reactions  . Metformin Diarrhea    DISCHARGE MEDICATIONS:   Allergies as of 11/26/2018      Reactions   Metformin Diarrhea  Medication List    STOP taking these medications   amLODipine 10 MG tablet Commonly known as: NORVASC   DULoxetine 20 MG capsule Commonly known as: Cymbalta   DULoxetine 60 MG capsule Commonly known as: Cymbalta   gabapentin 300 MG/6ML solution Commonly known as: NEURONTIN   glipiZIDE 5 MG tablet Commonly known as: GLUCOTROL   lisinopril 40 MG tablet Commonly known as: ZESTRIL   Melatonin 3 MG Tabs     TAKE these medications    albuterol (2.5 MG/3ML) 0.083% nebulizer solution Commonly known as: PROVENTIL Take 2.5 mg by nebulization every 4 (four) hours as needed for wheezing or shortness of breath.   atorvastatin 40 MG tablet Commonly known as: LIPITOR Take 40 mg by mouth every evening.   baclofen 10 MG tablet Commonly known as: LIORESAL Take 1 tablet (10 mg total) by mouth 3 (three) times daily.   carvedilol 25 MG tablet Commonly known as: COREG Take 25 mg by mouth 2 (two) times daily with a meal.   cephALEXin 500 MG capsule Commonly known as: KEFLEX Take 1 capsule (500 mg total) by mouth 4 (four) times daily for 4 days.   doxazosin 2 MG tablet Commonly known as: CARDURA Take 2 mg by mouth at bedtime.   famotidine 20 MG tablet Commonly known as: PEPCID Take 20 mg by mouth 2 (two) times daily.   feeding supplement (OSMOLITE 1.5 CAL) Liqd Place 1,000 mLs into feeding tube continuous.   nutrition supplement (JUVEN) Pack Place 1 packet into feeding tube 2 (two) times daily between meals. Start taking on: November 27, 2018   feeding supplement (PRO-STAT SUGAR FREE 64) Liqd Place 30 mLs into feeding tube 2 (two) times daily.   finasteride 5 MG tablet Commonly known as: PROSCAR Take 1 tablet (5 mg total) by mouth at bedtime.   free water Soln Place 150 mLs into feeding tube every 4 (four) hours.   levETIRAcetam 750 MG tablet Commonly known as: KEPPRA Take 750 mg by mouth 2 (two) times daily.   modafinil 200 MG tablet Commonly known as: PROVIGIL Place 1 tablet (200 mg total) into feeding tube 2 (two) times a day.   mouth rinse Liqd solution 15 mLs by Mouth Rinse route 2 times daily at 12 noon and 4 pm.   polyethylene glycol 17 g packet Commonly known as: MIRALAX / GLYCOLAX Take 17 g by mouth daily as needed for mild constipation.         DISCHARGE INSTRUCTIONS:   DIET:  Tube feedings  DISCHARGE CONDITION:  Stable  ACTIVITY:  Activity as tolerated  OXYGEN:  Home Oxygen:  No.   Oxygen Delivery: room air  DISCHARGE LOCATION:  nursing home   If you experience worsening of your admission symptoms, develop shortness of breath, life threatening emergency, suicidal or homicidal thoughts you must seek medical attention immediately by calling 911 or calling your MD immediately  if symptoms less severe.  You Must read complete instructions/literature along with all the possible adverse reactions/side effects for all the Medicines you take and that have been prescribed to you. Take any new Medicines after you have completely understood and accpet all the possible adverse reactions/side effects.   Please note  You were cared for by a hospitalist during your hospital stay. If you have any questions about your discharge medications or the care you received while you were in the hospital after you are discharged, you can call the unit and asked to speak with the hospitalist on call if  the hospitalist that took care of you is not available. Once you are discharged, your primary care physician will handle any further medical issues. Please note that NO REFILLS for any discharge medications will be authorized once you are discharged, as it is imperative that you return to your primary care physician (or establish a relationship with a primary care physician if you do not have one) for your aftercare needs so that they can reassess your need for medications and monitor your lab values.     Today   No acute events overnight. Afebrile today. Urine cultures grew Proteus.   VITAL SIGNS:  Blood pressure (!) 100/50, pulse (!) 105, temperature 98.5 F (36.9 C), temperature source Oral, resp. rate 16, height 6' (1.829 m), weight 99.1 kg, SpO2 99 %.  I/O:    Intake/Output Summary (Last 24 hours) at 11/26/2018 1542 Last data filed at 11/26/2018 1200 Gross per 24 hour  Intake 7052.5 ml  Output 2200 ml  Net 4852.5 ml    PHYSICAL EXAMINATION:   GENERAL:  63 y.o.-year-old  patient lying in bed in NAD.  EYES: Pupils equal, round, reactive to light. No scleral icterus. Extraocular muscles intact.  HEENT: Head atraumatic, normocephalic. Oropharynx and nasopharynx clear.  NECK:  Supple, no jugular venous distention. No thyroid enlargement, no tenderness.  LUNGS: Poor Resp. effort, no wheezing, rales, rhonchi. No use of accessory muscles of respiration.  CARDIOVASCULAR: S1, S2 normal. No murmurs, rubs, or gallops.  ABDOMEN: Soft, nontender, nondistended. Bowel sounds present. No organomegaly or mass. PEG tube in place.  EXTREMITIES: No cyanosis, clubbing or edema b/l.    NEUROLOGIC: Cranial nerves II through XII are intact. No focal Motor or sensory deficits b/l. Globally weak and bedbound and contracted upper and lower ext.  PSYCHIATRIC: The patient is alert and oriented x 1.  SKIN: No obvious rash, lesion, or ulcer.   Foley cath in place with yellow urine draining.   DATA REVIEW:   CBC Recent Labs  Lab 11/25/18 0418  WBC 14.5*  HGB 9.4*  HCT 30.0*  PLT 520*    Chemistries  Recent Labs  Lab 11/25/18 0418  NA 141  K 3.8  CL 105  CO2 27  GLUCOSE 143*  BUN 27*  CREATININE 0.54*  CALCIUM 8.6*    Cardiac Enzymes No results for input(s): TROPONINI in the last 168 hours.  Microbiology Results  Results for orders placed or performed during the hospital encounter of 10/19/18  SARS Coronavirus 2 (CEPHEID - Performed in [place] hospital lab), Hosp Order     Status: None   Collection Time: 10/19/18  9:28 AM   Specimen: Nasopharyngeal Swab  Result Value Ref Range Status   SARS Coronavirus 2 NEGATIVE NEGATIVE Final    Comment: (NOTE) If result is NEGATIVE SARS-CoV-2 target nucleic acids are NOT DETECTED. The SARS-CoV-2 RNA is generally detectable in upper and lower  respiratory specimens during the acute phase of infection. The lowest  concentration of SARS-CoV-2 viral copies this assay can detect is 250  copies / mL. A negative result does  not preclude SARS-CoV-2 infection  and should not be used as the sole basis for treatment or other  patient management decisions.  A negative result may occur with  improper specimen collection / handling, submission of specimen other  than nasopharyngeal swab, presence of viral mutation(s) within the  areas targeted by this assay, and inadequate number of viral copies  (<250 copies / mL). A negative result must be combined with clinical  observations, patient history, and epidemiological information. If result is POSITIVE SARS-CoV-2 target nucleic acids are DETECTED. The SARS-CoV-2 RNA is generally detectable in upper and lower  respiratory specimens dur ing the acute phase of infection.  Positive  results are indicative of active infection with SARS-CoV-2.  Clinical  correlation with patient history and other diagnostic information is  necessary to determine patient infection status.  Positive results do  not rule out bacterial infection or co-infection with other viruses. If result is PRESUMPTIVE POSTIVE SARS-CoV-2 nucleic acids MAY BE PRESENT.   A presumptive positive result was obtained on the submitted specimen  and confirmed on repeat testing.  While 2019 novel coronavirus  (SARS-CoV-2) nucleic acids may be present in the submitted sample  additional confirmatory testing may be necessary for epidemiological  and / or clinical management purposes  to differentiate between  SARS-CoV-2 and other Sarbecovirus currently known to infect humans.  If clinically indicated additional testing with an alternate test  methodology [phone]) is advised. The SARS-CoV-2 RNA is generally  detectable in upper and lower respiratory sp ecimens during the acute  phase of infection. The expected result is Negative. Fact Sheet for Patients:  StrictlyIdeas.no Fact Sheet for Healthcare Providers: BankingDealers.co.za This test is not yet approved or  cleared by the [place] FDA and has been authorized for detection and/or diagnosis of SARS-CoV-2 by FDA under an Emergency Use Authorization (EUA).  This EUA will remain in effect (meaning this test can be used) for the duration of the COVID-19 declaration under Section 564(b)(1) of the Act, 21 U.S.C. section 360bbb-3(b)(1), unless the authorization is terminated or revoked sooner. Performed at [facility], [address] [place], [place] [address]   Blood culture (routine x 2)     Status: None   Collection Time: 10/19/18  9:36 AM   Specimen: BLOOD  Result Value Ref Range Status   Specimen Description BLOOD LUA  Final   Special Requests   Final    BOTTLES DRAWN AEROBIC AND ANAEROBIC Blood Culture adequate volume   Culture   Final    NO GROWTH 5 DAYS Performed at [facility], [address] [place], [place] [address]    Report Status 10/24/2018 FINAL  Final  Blood culture (routine x 2)     Status: None   Collection Time: 10/19/18 10:16 AM   Specimen: BLOOD  Result Value Ref Range Status   Specimen Description BLOOD LFA  Final   Special Requests   Final    BOTTLES DRAWN AEROBIC AND ANAEROBIC Blood Culture results may not be optimal due to an inadequate volume of blood received in culture bottles   Culture   Final    NO GROWTH 5 DAYS Performed at [facility], [address] [place],  [address]    Report Status 10/24/2018 FINAL  Final  MRSA PCR Screening     Status: Abnormal   Collection Time: 10/19/18  1:06 PM   Specimen: Nasopharyngeal  Result Value Ref Range Status   MRSA by PCR POSITIVE (A) NEGATIVE Final    Comment:        The GeneXpert MRSA Assay (FDA approved for NASAL specimens only), is one component of a comprehensive MRSA colonization surveillance program. It is not intended to diagnose MRSA infection nor to guide or monitor treatment for MRSA infections. CRITICAL RESULT CALLED TO, READ BACK BY AND VERIFIED  WITH: CALLED TO TANYA SILVA @1426  10/19/2018 [facility] Performed at [facility], [address] [place],  [place] [address]   Urine Culture     Status: None   Collection Time: 10/21/18  4:10 PM   Specimen: Urine, Catheterized  Result Value Ref Range Status   Specimen Description   Final    URINE, CATHETERIZED Performed at [facility], [address], [place], [place] [address]    Special Requests   Final    Normal Performed at [facility], [address] [place], [place] [address]    Culture   Final    NO GROWTH Performed at [facility], [address], [place], [place] [address]    Report Status 10/22/2018 FINAL  Final  SARS Coronavirus 2 [facility] order, Performed in [place] hospital lab)     Status: None   Collection Time: 11/07/18 10:05 AM   Specimen: Nasopharyngeal Swab  Result Value Ref Range Status   SARS Coronavirus 2 NEGATIVE NEGATIVE Final    Comment: (NOTE) If result is NEGATIVE SARS-CoV-2 target nucleic acids are NOT DETECTED. The SARS-CoV-2 RNA is generally detectable in upper and lower  respiratory specimens during the acute phase of infection. The lowest  concentration of SARS-CoV-2 viral copies this assay can detect is 250  copies / mL. A negative result does not preclude SARS-CoV-2 infection  and should not be used as the sole basis for treatment or other  patient management decisions.  A negative result may occur with  improper specimen collection / handling, submission of specimen other  than nasopharyngeal swab, presence of viral mutation(s) within the  areas targeted by this assay, and inadequate number of viral copies  (<250 copies / mL). A negative result must be combined with clinical  observations, patient history, and epidemiological information. If result is POSITIVE SARS-CoV-2 target nucleic acids are DETECTED. The SARS-CoV-2 RNA is generally detectable in upper and lower  respiratory specimens dur ing the  acute phase of infection.  Positive  results are indicative of active infection with SARS-CoV-2.  Clinical  correlation with patient history and other diagnostic information is  necessary to determine patient infection status.  Positive results do  not rule out bacterial infection or co-infection with other viruses. If result is PRESUMPTIVE POSTIVE SARS-CoV-2 nucleic acids MAY BE PRESENT.   A presumptive positive result was obtained on the submitted specimen  and confirmed on repeat testing.  While 2019 novel coronavirus  (SARS-CoV-2) nucleic acids may be present in the submitted sample  additional confirmatory testing may be necessary for epidemiological  and / or clinical management purposes  to differentiate between  SARS-CoV-2 and other Sarbecovirus currently known to infect humans.  If clinically indicated additional testing with an alternate test  methodology [phone]) is advised. The SARS-CoV-2 RNA is generally  detectable in upper and lower respiratory sp ecimens during the acute  phase of infection. The expected result is Negative. Fact Sheet for Patients:  StrictlyIdeas.no Fact Sheet for Healthcare Providers: BankingDealers.co.za This test is not yet approved or cleared by the [place] FDA and has been authorized for detection and/or diagnosis of SARS-CoV-2 by FDA under an Emergency Use Authorization (EUA).  This EUA will remain in effect (meaning this test can be used) for the duration of the COVID-19 declaration under Section 564(b)(1) of the Act, 21 U.S.C. section 360bbb-3(b)(1), unless the authorization is terminated or revoked sooner. Performed at [facility], [address] [place], [place] [address]   Urine Culture     Status: Abnormal   Collection Time: 11/24/18  2:55 PM  Specimen: Urine, Catheterized  Result Value Ref Range Status   Specimen Description   Final    URINE, CATHETERIZED Performed at  [facility], [address] [place], [place] [address]    Special Requests   Final    NONE Performed at [facility], [place], [place] [address]    Culture >=100,000 COLONIES/mL PROTEUS MIRABILIS (A)  Final   Report Status 11/26/2018 FINAL  Final   Organism ID, Bacteria PROTEUS MIRABILIS (A)  Final      Susceptibility   Proteus mirabilis - MIC*    AMPICILLIN <=2 SENSITIVE Sensitive     CEFAZOLIN 8 SENSITIVE Sensitive     CEFTRIAXONE <=1 SENSITIVE Sensitive     CIPROFLOXACIN <=0.25 SENSITIVE Sensitive     GENTAMICIN <=1 SENSITIVE Sensitive     IMIPENEM 4 SENSITIVE Sensitive     NITROFURANTOIN 128 RESISTANT Resistant     TRIMETH/SULFA <=20 SENSITIVE Sensitive     AMPICILLIN/SULBACTAM <=2 SENSITIVE Sensitive     PIP/TAZO <=4 SENSITIVE Sensitive     * >=100,000 COLONIES/mL PROTEUS MIRABILIS    RADIOLOGY:  No results found.    Management plans discussed with the patient, family and they are in agreement.  CODE STATUS:     Code Status Orders  (From admission, onward)         Start     Ordered   10/19/18 1309  Full code  Continuous     10/19/18 1308          TOTAL TIME TAKING CARE OF THIS PATIENT: 45 minutes.    Henreitta Leber M.D on 11/26/2018 at 3:42 PM  Between 7am to 6pm - Pager - [phone]  After 6pm go to [email or web address] - Proofreader  [facility]  Office  [phone]  CC: Primary care physician; Garwin Brothers, MD

## 2018-11-26 NOTE — TOC Progression Note (Addendum)
Transition of Care [facility]) - Progression Note    Patient Details  Name: [name] MRN: [id number] Date of Birth: 22-Dec-1955  Transition of Care [facility]) CM/SW Contact  Dmiya Malphrus, Lenice Llamas Phone Number: [phone]  11/26/2018, 9:18 AM  Clinical Narrative: [place] authorization has expired. Clinical Education officer, museum (CSW) re-started [employer] SNF authorization today. CSW attempted to contact patient's DSS Medicaid worker Raynaldo Opitz [phone] however she did not answer and a voicemail was left. CSW also left a voicemail for DSS Medicaid supervisor [phone].   DSS worker Beola Cord called Clinical Social Worker ([place]) back on 6/30 and stated that patient has applied for long term care SNF medicaid. Per DSS worker patient will have to be placed in a SNF before the medicaid application can be completed.       Expected Discharge Plan: [place] Barriers to Discharge: No SNF bed, SNF Authorization Denied, Barriers Unresolved (comment)(Family plan to private [facility]- no beds available until 6/18)  Expected Discharge Plan and Services Expected Discharge Plan: [place]                                               Social Determinants of Health (SDOH) Interventions    Readmission Risk Interventions Readmission Risk Prevention Plan 11/08/2018  Post Dischage Appt Complete  Medication Screening Complete  Transportation Screening Complete  Some recent data might be hidden

## 2018-11-27 NOTE — Progress Notes (Signed)
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 who presented from his facility unresponsive requiring him to be ventilated in the ED. Patient has been seen in previous years at behavioral health for suicide ideation.    OT comments  Pt seen this afternoon for OT session focused on caregiver training/education in resting hand splint positioning, donning/doffing, and wear schedule. RN verbalized understanding of instruction and able to return demo proper application of splints after initial instruction and visual demonstration. Pt continues to benefit from skilled OT services to maximize return to PLOF, minimize further functional decline and contractures, and minimize caregiver burden.    Follow Up Recommendations  [facility]    Equipment Recommendations  Hospital bed;Other (comment)(hoyer lift)    Recommendations for Other Services      Precautions / Restrictions Precautions Precautions: Fall;Other (comment) Precaution Comments: NPO/PEG, non-verbal Restrictions Weight Bearing Restrictions: No Other Position/Activity Restrictions: bilat resting hand splints on/off - continue to assess for optimal positioning/fitting, skin integrity       Mobility Bed Mobility Overal bed mobility: Needs Assistance Bed Mobility: Rolling Rolling: Total assist         General bed mobility comments: total assist for all repositioning, pressure relief and any attempts at mobility  Transfers Overall transfer level: Needs assistance               General transfer comment: unsafe/unable; plan to attempt transition to sitting next session    Balance Overall balance assessment: Needs  assistance Sitting-balance support: Feet supported Sitting balance-Leahy Scale: Zero                                     ADL either performed or assessed with clinical judgement   ADL Overall ADL's : Needs assistance/impaired Eating/Feeding: NPO   Grooming: Maximal assistance   Upper Body Bathing: Total assistance   Lower Body Bathing: Total assistance   Upper Body Dressing : Total assistance   Lower Body Dressing: Total assistance                 General ADL Comments: Max-Total Assist for ADL     Vision   Additional Comments: difficult to formally assess, pt does visually track therapist in room   Perception     Praxis Praxis Praxis-Other Comments: able to at times scratch his nose but unable to replicate when asked, difficulty with motor planning in all limbs    Cognition Arousal/Alertness: Awake/alert Behavior During Therapy: Flat affect Overall Cognitive Status: No family/caregiver present to determine baseline cognitive functioning Area of Impairment: Following commands                       Following Commands: Follows one step commands inconsistently       General Comments: pt visually tracks therapist, able to follow simple intermittent commands with visual cues and additional time to process        Exercises Other Exercises Other Exercises: caregiver training to RN for resting hand splint positioning, wear schedule, and donning/doffing to optimize safety and adherence with splint use.   Shoulder Instructions       General Comments bilat resting hand  splints repositioned to optimize placement and skin/joint integrity    Pertinent Vitals/ Pain       Pain Assessment: Faces Pain Score: 2  Faces Pain Scale: No hurt Pain Location: Grimmacing initially with gentle stretching in the lefft thumb, 3rd, and 4th digit. Pain Descriptors / Indicators: Grimacing Pain Intervention(s): Limited activity within patient's  tolerance;Monitored during session  Home Living                                          Prior Functioning/Environment              Frequency  Min 2X/week        Progress Toward Goals  OT Goals(current goals can now be found in the care plan section)  Progress towards OT goals: OT to reassess next treatment  Acute Rehab OT Goals Patient Stated Goal: patient unable OT Goal Formulation: Patient unable to participate in goal setting  Plan Discharge plan remains appropriate;Frequency remains appropriate    Co-evaluation                 AM-PAC OT "6 Clicks" Daily Activity     Outcome Measure   Help from another person eating meals?: Total Help from another person taking care of personal grooming?: A Lot Help from another person toileting, which includes using toliet, bedpan, or urinal?: Total Help from another person bathing (including washing, rinsing, drying)?: Total Help from another person to put on and taking off regular upper body clothing?: Total Help from another person to put on and taking off regular lower body clothing?: Total 6 Click Score: 7    End of Session Equipment Utilized During Treatment: Other (comment)(Bilateral hand splints)  OT Visit Diagnosis: Other abnormalities of gait and mobility (R26.89);Cognitive communication deficit (R41.841);Muscle weakness (generalized) (M62.81);Apraxia (R48.2) Symptoms and signs involving cognitive functions: Other cerebrovascular disease   Activity Tolerance Patient tolerated treatment well   Patient Left in bed;with call bell/phone within reach;with bed alarm set;with nursing/sitter in room   Nurse Communication          Time: 2536-6440 OT Time Calculation (min): 14 min  Charges: OT General Charges $OT Visit: 1 Visit OT Treatments $Self Care/Home Management : 23-37 mins $Therapeutic Activity: 8-22 mins  Jeni Salles, MPH, MS, OTR/L ascom [phone] 11/27/18, 3:29 PM

## 2018-11-27 NOTE — TOC Progression Note (Addendum)
Transition of Care [facility]) - Progression Note    Patient Details  Name: [name] MRN: [id number] Date of Birth: 04/16/1956  Transition of Care [facility]) CM/SW Contact  Aryaa Bunting, Lenice Llamas Phone Number: [phone]  11/27/2018, 9:33 AM  Clinical Narrative: Clinical Social Worker (CSW) attempted to contact patient's daughter [name] twice to get her decision about going home or to [facility] [facility] however she did not answer and 2 voicemails were left.     Expected Discharge Plan: [place] Barriers to Discharge: No SNF bed, SNF Authorization Denied, Barriers Unresolved (comment)(Family plan to private pay for [facility]- no beds available until 6/18)  Expected Discharge Plan and Services Expected Discharge Plan: [place]         Expected Discharge Date: 11/26/18                                     Social Determinants of Health (SDOH) Interventions    Readmission Risk Interventions Readmission Risk Prevention Plan 11/08/2018  Post Dischage Appt Complete  Medication Screening Complete  Transportation Screening Complete  Some recent data might be hidden

## 2018-11-27 NOTE — Progress Notes (Signed)
PT Cancellation Note  Patient Details Name: [name] MRN: [id number] DOB: 1955/08/08   Cancelled Treatment:    Reason Eval/Treat Not Completed: Other (comment).  Pt resting in bed upon PT arrival.  Pt occasionally nodding his head yes to therapists questions (but inconsistent).  Upon taking pt's sheets off, pt noted to be incontinent of bowel and requiring clean-up: nursing tech notified and came to assist pt with clean-up.  Will re-attempt PT treatment session at a later date/time.  Leitha Bleak, PT 11/27/18, 4:29 PM [phone]

## 2018-11-27 NOTE — Care Management Important Message (Signed)
Important Message  Patient Details  Name: [name] MRN: [id number] Date of Birth: Oct 16, 1955   Medicare Important Message Given:  Yes     [name] A Mykah Shin 11/27/2018, 10:23 AM

## 2018-11-27 NOTE — Progress Notes (Signed)
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 who presented from his facility unresponsive requiring him to be ventilated in the ED. Patient has been seen in previous years at behavioral health for suicide ideation.    OT comments  PROM was performed in all joint ranges of the BUEs. Pt. presented with flexor tone, and tightness in the LUE, and hand. Bilateral resting hand splints were applied. Nursing education was provided about splint care, recommeneded Wearing schedule, splint application, and skin checks. Pt. Is Total assist for ADLs, and requires max hand-over-hand assist for hand to face patterns in preparation for light grooming tasks. Pt. Continues to benefit from OT services for UE there. Ex, ADL training, A/E training, and pt./caregiver education about positioning, and splint care.   Follow Up Recommendations  [facility]    Equipment Recommendations  Hospital bed;Other (comment)    Recommendations for Other Services      Precautions / Restrictions Restrictions Weight Bearing Restrictions: No       Mobility Bed Mobility Overal bed mobility: Needs Assistance Bed Mobility: Rolling Rolling: Total assist         General bed mobility comments: total assist for positioning  Transfers Overall transfer level: Needs assistance               General transfer comment: deferred    Balance                                      ADL either performed or assessed with clinical judgement   ADL Overall ADL's : Needs assistance/impaired Eating/Feeding: NPO   Grooming: Maximal assistance   Upper Body Bathing: Total assistance   Lower Body Bathing: Total  assistance   Upper Body Dressing : Total assistance   Lower Body Dressing: Total assistance                       Vision       Perception     Praxis      Cognition Arousal/Alertness: Awake/alert Behavior During Therapy: Flat affect Overall Cognitive Status: No family/caregiver present to determine baseline cognitive functioning Area of Impairment: Following commands                       Following Commands: Follows one step commands inconsistently                Exercises     Shoulder Instructions       General Comments      Pertinent Vitals/ Pain       Pain Assessment: Faces Pain Score: 2  Faces Pain Scale: Hurts a little bit Pain Location: Grimmacing initially with gentle stretching in the lefft thumb, 3rd, and 4th digit. Pain Descriptors / Indicators: Grimacing  Home Living                                          Prior Functioning/Environment              Frequency  Min 2X/week  Progress Toward Goals  OT Goals(current goals can now be found in the care plan section)  Progress towards OT goals: Progressing toward goals  Acute Rehab OT Goals Patient Stated Goal: patient unable  Plan Discharge plan remains appropriate;Frequency remains appropriate    Co-evaluation                 AM-PAC OT "6 Clicks" Daily Activity     Outcome Measure   Help from another person eating meals?: Total Help from another person taking care of personal grooming?: A Lot Help from another person toileting, which includes using toliet, bedpan, or urinal?: Total Help from another person bathing (including washing, rinsing, drying)?: Total Help from another person to put on and taking off regular upper body clothing?: Total Help from another person to put on and taking off regular lower body clothing?: Total 6 Click Score: 7    End of Session Equipment Utilized During Treatment: Other (comment)(Bilateral hand  splints)  OT Visit Diagnosis: Other abnormalities of gait and mobility (R26.89);Cognitive communication deficit (R41.841);Muscle weakness (generalized) (M62.81);Apraxia (R48.2) Symptoms and signs involving cognitive functions: Other cerebrovascular disease   Activity Tolerance Patient tolerated treatment well   Patient Left in bed;with call bell/phone within reach;with bed alarm set   Nurse Communication          Time: 4481-8563 OT Time Calculation (min): 24 min  Charges: OT Treatments $Self Care/Home Management : 23-37 mins  Harrel Carina, MS, OTR/L   Harrel Carina 11/27/2018, 12:18 PM

## 2018-11-27 NOTE — TOC Progression Note (Signed)
Transition of Care [facility]) - Progression Note    Patient Details  Name: [name] MRN: [id number] Date of Birth: Mar 28, 1956  Transition of Care [facility]) CM/SW Contact  Badr Piedra, Lenice Llamas Phone Number: [phone]  11/27/2018, 2:22 PM  Clinical Narrative:  Clinical Social Worker (CSW) attempted to call patient's daughter [name] for a 3rd time today however she did not answer and a voicemail was left.     Expected Discharge Plan: [place] Barriers to Discharge: No SNF bed, SNF Authorization Denied, Barriers Unresolved (comment)(Family plan to private pay for [facility]- no beds available until 6/18)  Expected Discharge Plan and Services Expected Discharge Plan: [place]         Expected Discharge Date: 11/26/18                                     Social Determinants of Health (SDOH) Interventions    Readmission Risk Interventions Readmission Risk Prevention Plan 11/08/2018  Post Dischage Appt Complete  Medication Screening Complete  Transportation Screening Complete  Some recent data might be hidden

## 2018-11-27 NOTE — Progress Notes (Signed)
[facility] [place] NAME: [name]    MR#:  [id number]  DATE OF BIRTH:  05/18/1956  SUBJECTIVE:   The patient is nonverbal.  He had low-grade fever. REVIEW OF SYSTEMS:    Review of Systems  Unable to perform ROS: Mental acuity    Nutrition: tube feeds Tolerating Diet: yes Tolerating PT: bedbound at baseline.   DRUG ALLERGIES:   Allergies  Allergen Reactions  . Metformin Diarrhea    VITALS:  Blood pressure 128/79, pulse 88, temperature 97.8 F (36.6 C), temperature source Axillary, resp. rate 18, height 6' (1.829 m), weight 99.1 kg, SpO2 96 %.  PHYSICAL EXAMINATION:   Physical Exam  GENERAL:  63 y.o.-year-old patient lying in bed in NAD.  EYES: Pupils equal, round, reactive to light. No scleral icterus. Extraocular muscles intact.  HEENT: Head atraumatic, normocephalic. Oropharynx and nasopharynx clear.  NECK:  Supple, no jugular venous distention. No thyroid enlargement, no tenderness.  LUNGS: Poor Resp. effort, no wheezing, rales, rhonchi. No use of accessory muscles of respiration.  CARDIOVASCULAR: S1, S2 normal. No murmurs, rubs, or gallops.  ABDOMEN: Soft, nontender, nondistended. Bowel sounds present. No organomegaly or mass.  EXTREMITIES: No cyanosis, clubbing or edema b/l.    NEUROLOGIC: Cranial nerves II through XII are intact. No focal Motor or sensory deficits b/l. Globally weak and bedbound and contracted upper and lower ext.  PSYCHIATRIC: The patient is confused and noncommunicative.  SKIN: No obvious rash, lesion, or ulcer.    LABORATORY PANEL:   CBC Recent Labs  Lab 11/25/18 0418  WBC 14.5*  HGB 9.4*  HCT 30.0*  PLT 520*   ------------------------------------------------------------------------------------------------------------------  Chemistries  Recent Labs  Lab 11/25/18 0418  NA 141  K 3.8  CL 105  CO2 27  GLUCOSE 143*  BUN 27*  CREATININE 0.54*  CALCIUM 8.6*    ------------------------------------------------------------------------------------------------------------------  Cardiac Enzymes No results for input(s): TROPONINI in the last 168 hours. ------------------------------------------------------------------------------------------------------------------  RADIOLOGY:  No results found.   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  1. Acute encephalopathy- suspected secondary to hypoglycemia when pt presented to the hospital - pt required mechanical ventilation initially -Has bilateral encephalomalacia based on CT head. Hx of left frontal craniotomy done. Prior history of strokes. -Has right anterior cerebral artery aneurysm embolization in the past and clipping recently this year. - MRI cannot be done due to recent clipping. - Seen by Neurology and no CVA on work up. Empirically on Keppra for seizures.  - started on Provigil and dose increased without significant change. Remains alert but does not following commands and likely pt's baseline now. -S/P PEG placement on June 5th 2020-PEG feeding being tolerated well.  -Patient has significant rigidity of his upper and lower extremities.  Started on some baclofen and will continue.  2.UTI - pt.urine cultures are positive for 100,000 colonies of gram-negative rod. --Urine cultures are positive for Proteus.  Patient treated with IV ceftriaxone and Ancef and now being discharged on Keflex for 4 more days.  3. Acute renal failure-secondary to acute urinary retention with history of BPH -Appreciate nephrology consult. Bilateral hydronephrosis on renal ultrasound. - Status post Foley cath -Renal function has normalized and patient will continue  Flomax, finasteride  4. Poor oral intake/Nutrition - now  s/p PEG tube placement and tolerating tube feeds well.   5. Seizure prevention -cont. Keppra.  6. Low grade fever -Suspected to be due to UTI.  - resolved now. Cont. abx as mentioned above.   - Pt was denied LTAC services per CSW. He was from [facility].  His [facility].  Await further input from social work regarding Disposition.   All the records are reviewed and case discussed with Care Management/Social Worker. Management plans discussed with the patient, family and they are in agreement.  CODE STATUS: Full code  DVT Prophylaxis: Lovenox  TOTAL TIME TAKING CARE OF THIS PATIENT: 30 minutes.   POSSIBLE D/C unclear DAYS, DEPENDING ON CLINICAL CONDITION and progress.   Demetrios Loll M.D on 11/27/2018 at 1:29 PM  Between 7am to 6pm - Pager - [phone]  After 6pm go to [email or web address] - Proofreader  [facility] [place]  Office  [phone]  CC: Primary care physician; Garwin Brothers, MD

## 2018-11-28 DIAGNOSIS — R339 Retention of urine, unspecified: Secondary | ICD-10-CM | POA: Diagnosis not present

## 2018-11-28 DIAGNOSIS — N390 Urinary tract infection, site not specified: Secondary | ICD-10-CM | POA: Diagnosis not present

## 2018-11-28 DIAGNOSIS — R1312 Dysphagia, oropharyngeal phase: Secondary | ICD-10-CM | POA: Diagnosis not present

## 2018-11-28 DIAGNOSIS — Z515 Encounter for palliative care: Secondary | ICD-10-CM | POA: Diagnosis not present

## 2018-11-28 DIAGNOSIS — M6281 Muscle weakness (generalized): Secondary | ICD-10-CM | POA: Diagnosis not present

## 2018-11-28 DIAGNOSIS — I69359 Hemiplegia and hemiparesis following cerebral infarction affecting unspecified side: Secondary | ICD-10-CM | POA: Diagnosis not present

## 2018-11-28 DIAGNOSIS — A419 Sepsis, unspecified organism: Secondary | ICD-10-CM | POA: Diagnosis not present

## 2018-11-28 DIAGNOSIS — L89892 Pressure ulcer of other site, stage 2: Secondary | ICD-10-CM | POA: Diagnosis not present

## 2018-11-28 DIAGNOSIS — D72829 Elevated white blood cell count, unspecified: Secondary | ICD-10-CM | POA: Diagnosis not present

## 2018-11-28 DIAGNOSIS — D649 Anemia, unspecified: Secondary | ICD-10-CM | POA: Diagnosis not present

## 2018-11-28 DIAGNOSIS — J9601 Acute respiratory failure with hypoxia: Secondary | ICD-10-CM | POA: Diagnosis not present

## 2018-11-28 DIAGNOSIS — R4701 Aphasia: Secondary | ICD-10-CM | POA: Diagnosis not present

## 2018-11-28 DIAGNOSIS — I63421 Cerebral infarction due to embolism of right anterior cerebral artery: Secondary | ICD-10-CM | POA: Diagnosis not present

## 2018-11-28 DIAGNOSIS — G9389 Other specified disorders of brain: Secondary | ICD-10-CM | POA: Diagnosis not present

## 2018-11-28 DIAGNOSIS — R5381 Other malaise: Secondary | ICD-10-CM | POA: Diagnosis not present

## 2018-11-28 DIAGNOSIS — M2550 Pain in unspecified joint: Secondary | ICD-10-CM | POA: Diagnosis not present

## 2018-11-28 DIAGNOSIS — R4182 Altered mental status, unspecified: Secondary | ICD-10-CM | POA: Diagnosis not present

## 2018-11-28 DIAGNOSIS — Z7401 Bed confinement status: Secondary | ICD-10-CM | POA: Diagnosis not present

## 2018-11-28 DIAGNOSIS — N179 Acute kidney failure, unspecified: Secondary | ICD-10-CM | POA: Diagnosis not present

## 2018-11-28 DIAGNOSIS — I619 Nontraumatic intracerebral hemorrhage, unspecified: Secondary | ICD-10-CM | POA: Diagnosis not present

## 2018-11-28 DIAGNOSIS — R0989 Other specified symptoms and signs involving the circulatory and respiratory systems: Secondary | ICD-10-CM | POA: Diagnosis not present

## 2018-11-28 DIAGNOSIS — R41841 Cognitive communication deficit: Secondary | ICD-10-CM | POA: Diagnosis not present

## 2018-11-28 DIAGNOSIS — E119 Type 2 diabetes mellitus without complications: Secondary | ICD-10-CM | POA: Diagnosis not present

## 2018-11-28 DIAGNOSIS — E11649 Type 2 diabetes mellitus with hypoglycemia without coma: Secondary | ICD-10-CM | POA: Diagnosis not present

## 2018-11-28 NOTE — TOC Progression Note (Addendum)
Transition of Care [facility]) - Progression Note    Patient Details  Name: SAMAR DASS MRN: [id number] Date of Birth: [birth year]  Transition of Care [facility]) CM/SW Contact  Shelbie Hutching, RN Phone Number: 11/28/2018, 9:47 AM  Clinical Narrative:    [facility] Team was able to reach daughter via phone.  HINN 12 reviewed with patient's daughter [name] by supervisor Aida Raider and witnessed by this RNCM.  Dr. Doy Hutching, Medical Director in agreement with [facility] 12 being given.   [name] verbalized understanding of HINN 12, she understands that she has the right to appeal the discharge with Medicare.  Daughter does not want the number for appeal and she is in agreement for patient to discharge to [place] this afternoon, daughter is waiting on a call from the patient's neurologist and promises to call [facility] team back by this afternoon.     Expected Discharge Plan: [place] Barriers to Discharge: No SNF bed, SNF Authorization Denied, Barriers Unresolved (comment)(Family plan to private pay for [facility]- no beds available until 6/18)  Expected Discharge Plan and Services Expected Discharge Plan: [place]         Expected Discharge Date: 11/26/18                                     Social Determinants of Health (SDOH) Interventions    Readmission Risk Interventions Readmission Risk Prevention Plan 11/08/2018  Post Dischage Appt Complete  Medication Screening Complete  Transportation Screening Complete  Some recent data might be hidden

## 2018-11-28 NOTE — Discharge Summary (Addendum)
[facility] NAME: [name]    MR#:  [id number]  DATE OF BIRTH:  [birth year]  DATE OF ADMISSION:  10/19/2018 ADMITTING PHYSICIAN: Fritzi Mandes, MD  DATE OF DISCHARGE: 11/28/2018  PRIMARY CARE PHYSICIAN: Garwin Brothers, MD    ADMISSION DIAGNOSIS:  Acute urinary retention [R33.8] Acute renal failure, unspecified acute renal failure type ([place]) [N17.9] Altered mental status, unspecified altered mental status type [R41.82]  DISCHARGE DIAGNOSIS:  Active Problems:   Sepsis ([place])   Pressure injury of skin   SECONDARY DIAGNOSIS:   Past Medical History:  Diagnosis Date  . Allergy   . Anxiety   . Cataract   . Cerebral hemorrhage ([place])   . Chest pain, atypical    12/2003:  negative cardiolyte  . Depression   . Drug abuse ([place])   . Eczema   . Erectile dysfunction   . History of meniscal tear    bilateral  . HLD (hyperlipidemia)   . HTN (hypertension)   . Hx of tear of ACL (anterior cruciate ligament)    right  . Insomnia   . Olecranon bursitis of left elbow 10/2009   s/p I&D by Dr Maxie Better, initially assessed by Dr. Nori Riis   . Prediabetes   . Stroke ([place])   . Suicidal behavior 04/13/2017   Pt states taking a bunch of sleeping pills to end life   . 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  1. Acute encephalopathy- suspected secondary to hypoglycemia when pt presented to the hospital - pt required mechanical ventilation initially -Has bilateral encephalomalacia based on CT head. Hx of left frontal craniotomy done. Prior history of strokes. -Has right anterior cerebral artery aneurysm embolization in the past and clipping recently this year. - MRI cannot be  done due to recent clipping. - Seen by Neurology and no CVA on work up. Empirically on Keppra for seizures.  - started on Provigil and dose increased without significant change. Remains alert but does not following commands and likely pt's baseline now. -S/P PEG placement on June 5th 2020-PEG feeding being tolerated well.  -Patient has significant rigidity of his upper and lower extremities.  Started on some baclofen and will continue.  2.  UTI - pt. urine cultures are positive for 100,000 colonies of gram-negative rod. --Urine cultures are positive for Proteus.  Patient treated with IV ceftriaxone and Ancef and now being discharged on Keflex for 4 more days.  3. Acute renal failure-secondary to acute urinary retention with history of BPH -Appreciate nephrology consult. Bilateral hydronephrosis on renal ultrasound. - Status post Foley cath -Renal function has normalized and patient will continue Flomax, finasteride  4. Poor oral intake/Nutrition - now s/p PEG tube placement and tolerating tube feeds well.   5. Seizure prevention - cont. Keppra.  6. Low grade fever -  Suspected to be due to UTI.  - resolved now. Cont. abx as mentioned above.  Outpatient palliative care follow-up. DISCHARGE CONDITIONS:   Stable, discharge to nursing home today.  CONSULTS OBTAINED:  Treatment Team:  Leotis Pain, MD Alexis Goodell, MD  DRUG ALLERGIES:   Allergies  Allergen Reactions  . Metformin Diarrhea    DISCHARGE MEDICATIONS:   Allergies as of 11/28/2018  Reactions   Metformin Diarrhea      Medication List    STOP taking these medications   amLODipine 10 MG tablet Commonly known as: NORVASC   DULoxetine 20 MG capsule Commonly known as: Cymbalta   DULoxetine 60 MG capsule Commonly known as: Cymbalta   gabapentin 300 MG/6ML solution Commonly known as: NEURONTIN   glipiZIDE 5 MG tablet Commonly known as: GLUCOTROL   lisinopril 40 MG tablet Commonly known  as: ZESTRIL   Melatonin 3 MG Tabs     TAKE these medications   albuterol (2.5 MG/3ML) 0.083% nebulizer solution Commonly known as: PROVENTIL Take 2.5 mg by nebulization every 4 (four) hours as needed for wheezing or shortness of breath.   atorvastatin 40 MG tablet Commonly known as: LIPITOR Take 40 mg by mouth every evening.   baclofen 10 MG tablet Commonly known as: LIORESAL Take 1 tablet (10 mg total) by mouth 3 (three) times daily.   carvedilol 25 MG tablet Commonly known as: COREG Take 25 mg by mouth 2 (two) times daily with a meal.   cephALEXin 500 MG capsule Commonly known as: KEFLEX Take 1 capsule (500 mg total) by mouth 4 (four) times daily for 4 days.   doxazosin 2 MG tablet Commonly known as: CARDURA Take 2 mg by mouth at bedtime.   famotidine 20 MG tablet Commonly known as: PEPCID Take 20 mg by mouth 2 (two) times daily.   feeding supplement (OSMOLITE 1.5 CAL) Liqd Place 1,000 mLs into feeding tube continuous.   nutrition supplement (JUVEN) Pack Place 1 packet into feeding tube 2 (two) times daily between meals.   feeding supplement (PRO-STAT SUGAR FREE 64) Liqd Place 30 mLs into feeding tube 2 (two) times daily.   finasteride 5 MG tablet Commonly known as: PROSCAR Take 1 tablet (5 mg total) by mouth at bedtime.   free water Soln Place 150 mLs into feeding tube every 4 (four) hours.   levETIRAcetam 750 MG tablet Commonly known as: KEPPRA Take 750 mg by mouth 2 (two) times daily.   modafinil 200 MG tablet Commonly known as: PROVIGIL Place 1 tablet (200 mg total) into feeding tube 2 (two) times a day.   mouth rinse Liqd solution 15 mLs by Mouth Rinse route 2 times daily at 12 noon and 4 pm.   polyethylene glycol 17 g packet Commonly known as: MIRALAX / GLYCOLAX Take 17 g by mouth daily as needed for mild constipation.         DISCHARGE INSTRUCTIONS:   DIET:  Tube feedings  DISCHARGE CONDITION:  Stable  ACTIVITY:  Activity as  tolerated  OXYGEN:  Home Oxygen: No.   Oxygen Delivery: room air  DISCHARGE LOCATION:  nursing home   If you experience worsening of your admission symptoms, develop shortness of breath, life threatening emergency, suicidal or homicidal thoughts you must seek medical attention immediately by calling 911 or calling your MD immediately  if symptoms less severe.  You Must read complete instructions/literature along with all the possible adverse reactions/side effects for all the Medicines you take and that have been prescribed to you. Take any new Medicines after you have completely understood and accpet all the possible adverse reactions/side effects.   Please note  You were cared for by a hospitalist during your hospital stay. If you have any questions about your discharge medications or the care you received while you were in the hospital after you are discharged, you can call the unit and asked to speak with the  hospitalist on call if the hospitalist that took care of you is not available. Once you are discharged, your primary care physician will handle any further medical issues. Please note that NO REFILLS for any discharge medications will be authorized once you are discharged, as it is imperative that you return to your primary care physician (or establish a relationship with a primary care physician if you do not have one) for your aftercare needs so that they can reassess your need for medications and monitor your lab values.     Today   No acute events overnight. Afebrile today. Urine cultures grew Proteus.   VITAL SIGNS:  Blood pressure 116/70, pulse 60, temperature 98.2 F (36.8 C), temperature source Axillary, resp. rate 18, height 6' (1.829 m), weight 99.1 kg, SpO2 97 %.  I/O:    Intake/Output Summary (Last 24 hours) at 11/28/2018 1158 Last data filed at 11/28/2018 0536 Gross per 24 hour  Intake 8403.32 ml  Output 1200 ml  Net 7203.32 ml    PHYSICAL EXAMINATION:    GENERAL:  63 y.o.-year-old patient lying in bed in NAD.  EYES: Pupils equal, round, reactive to light. No scleral icterus. Extraocular muscles intact.  HEENT: Head atraumatic, normocephalic. Oropharynx and nasopharynx clear.  NECK:  Supple, no jugular venous distention. No thyroid enlargement, no tenderness.  LUNGS: Poor Resp. effort, no wheezing, rales, rhonchi. No use of accessory muscles of respiration.  CARDIOVASCULAR: S1, S2 normal. No murmurs, rubs, or gallops.  ABDOMEN: Soft, nontender, nondistended. Bowel sounds present. No organomegaly or mass. PEG tube in place.  EXTREMITIES: No cyanosis, clubbing or edema b/l.    NEUROLOGIC: Cranial nerves II through XII are intact. No focal Motor or sensory deficits b/l. Globally weak and bedbound and contracted upper and lower ext.  PSYCHIATRIC: The patient is alert and oriented x 1.  SKIN: No obvious rash, lesion, or ulcer.   Foley cath in place with yellow urine draining.   DATA REVIEW:   CBC Recent Labs  Lab 11/25/18 0418  WBC 14.5*  HGB 9.4*  HCT 30.0*  PLT 520*    Chemistries  Recent Labs  Lab 11/25/18 0418  NA 141  K 3.8  CL 105  CO2 27  GLUCOSE 143*  BUN 27*  CREATININE 0.54*  CALCIUM 8.6*    Cardiac Enzymes No results for input(s): TROPONINI in the last 168 hours.  Microbiology Results  Results for orders placed or performed during the hospital encounter of 10/19/18  SARS Coronavirus 2 (CEPHEID - Performed in [facility] hospital lab), Hosp Order     Status: None   Collection Time: 10/19/18  9:28 AM   Specimen: Nasopharyngeal Swab  Result Value Ref Range Status   SARS Coronavirus 2 NEGATIVE NEGATIVE Final    Comment: (NOTE) If result is NEGATIVE SARS-CoV-2 target nucleic acids are NOT DETECTED. The SARS-CoV-2 RNA is generally detectable in upper and lower  respiratory specimens during the acute phase of infection. The lowest  concentration of SARS-CoV-2 viral copies this assay can detect is 250  copies  / mL. A negative result does not preclude SARS-CoV-2 infection  and should not be used as the sole basis for treatment or other  patient management decisions.  A negative result may occur with  improper specimen collection / handling, submission of specimen other  than nasopharyngeal swab, presence of viral mutation(s) within the  areas targeted by this assay, and inadequate number of viral copies  (<250 copies / mL). A negative result must be combined  with clinical  observations, patient history, and epidemiological information. If result is POSITIVE SARS-CoV-2 target nucleic acids are DETECTED. The SARS-CoV-2 RNA is generally detectable in upper and lower  respiratory specimens dur ing the acute phase of infection.  Positive  results are indicative of active infection with SARS-CoV-2.  Clinical  correlation with patient history and other diagnostic information is  necessary to determine patient infection status.  Positive results do  not rule out bacterial infection or co-infection with other viruses. If result is PRESUMPTIVE POSTIVE SARS-CoV-2 nucleic acids MAY BE PRESENT.   A presumptive positive result was obtained on the submitted specimen  and confirmed on repeat testing.  While 2019 novel coronavirus  (SARS-CoV-2) nucleic acids may be present in the submitted sample  additional confirmatory testing may be necessary for epidemiological  and / or clinical management purposes  to differentiate between  SARS-CoV-2 and other Sarbecovirus currently known to infect humans.  If clinically indicated additional testing with an alternate test  methodology [phone]) is advised. The SARS-CoV-2 RNA is generally  detectable in upper and lower respiratory sp ecimens during the acute  phase of infection. The expected result is Negative. Fact Sheet for Patients:  StrictlyIdeas.no Fact Sheet for Healthcare Providers: BankingDealers.co.za This test  is not yet approved or cleared by the [place] FDA and has been authorized for detection and/or diagnosis of SARS-CoV-2 by FDA under an Emergency Use Authorization (EUA).  This EUA will remain in effect (meaning this test can be used) for the duration of the COVID-19 declaration under Section 564(b)(1) of the Act, 21 U.S.C. section 360bbb-3(b)(1), unless the authorization is terminated or revoked sooner. Performed at [facility], [address] [place], [place] [address]   Blood culture (routine x 2)     Status: None   Collection Time: 10/19/18  9:36 AM   Specimen: BLOOD  Result Value Ref Range Status   Specimen Description BLOOD LUA  Final   Special Requests   Final    BOTTLES DRAWN AEROBIC AND ANAEROBIC Blood Culture adequate volume   Culture   Final    NO GROWTH 5 DAYS Performed at [facility], [address] [place], [place] [address]    Report Status 10/24/2018 FINAL  Final  Blood culture (routine x 2)     Status: None   Collection Time: 10/19/18 10:16 AM   Specimen: BLOOD  Result Value Ref Range Status   Specimen Description BLOOD LFA  Final   Special Requests   Final    BOTTLES DRAWN AEROBIC AND ANAEROBIC Blood Culture results may not be optimal due to an inadequate volume of blood received in culture bottles   Culture   Final    NO GROWTH 5 DAYS Performed at [facility], [address] [place], [place] [address]    Report Status 10/24/2018 FINAL  Final  MRSA PCR Screening     Status: Abnormal   Collection Time: 10/19/18  1:06 PM   Specimen: Nasopharyngeal  Result Value Ref Range Status   MRSA by PCR POSITIVE (A) NEGATIVE Final    Comment:        The GeneXpert MRSA Assay (FDA approved for NASAL specimens only), is one component of a comprehensive MRSA colonization surveillance program. It is not intended to diagnose MRSA infection nor to guide or monitor treatment for MRSA infections. CRITICAL RESULT CALLED TO, READ BACK  BY AND VERIFIED WITH: CALLED TO TANYA SILVA @1426  10/19/2018 [facility] Performed at [facility], [place]  [address] [place], [place] [address]   Urine Culture     Status: None   Collection Time: 10/21/18  4:10 PM   Specimen: Urine, Catheterized  Result Value Ref Range Status   Specimen Description   Final    URINE, CATHETERIZED Performed at [facility], [address], [place], [place] [address]    Special Requests   Final    Normal Performed at [facility], [address] [place], [place] [address]    Culture   Final    NO GROWTH Performed at [facility], [place] [address] [place], [place] [address]    Report Status 10/22/2018 FINAL  Final  SARS Coronavirus 2 [facility] order, Performed in [place] hospital lab)     Status: None   Collection Time: 11/07/18 10:05 AM   Specimen: Nasopharyngeal Swab  Result Value Ref Range Status   SARS Coronavirus 2 NEGATIVE NEGATIVE Final    Comment: (NOTE) If result is NEGATIVE SARS-CoV-2 target nucleic acids are NOT DETECTED. The SARS-CoV-2 RNA is generally detectable in upper and lower  respiratory specimens during the acute phase of infection. The lowest  concentration of SARS-CoV-2 viral copies this assay can detect is 250  copies / mL. A negative result does not preclude SARS-CoV-2 infection  and should not be used as the sole basis for treatment or other  patient management decisions.  A negative result may occur with  improper specimen collection / handling, submission of specimen other  than nasopharyngeal swab, presence of viral mutation(s) within the  areas targeted by this assay, and inadequate number of viral copies  (<250 copies / mL). A negative result must be combined with clinical  observations, patient history, and epidemiological information. If result is POSITIVE SARS-CoV-2 target nucleic acids are DETECTED. The SARS-CoV-2 RNA is generally detectable in upper and lower  respiratory  specimens dur ing the acute phase of infection.  Positive  results are indicative of active infection with SARS-CoV-2.  Clinical  correlation with patient history and other diagnostic information is  necessary to determine patient infection status.  Positive results do  not rule out bacterial infection or co-infection with other viruses. If result is PRESUMPTIVE POSTIVE SARS-CoV-2 nucleic acids MAY BE PRESENT.   A presumptive positive result was obtained on the submitted specimen  and confirmed on repeat testing.  While 2019 novel coronavirus  (SARS-CoV-2) nucleic acids may be present in the submitted sample  additional confirmatory testing may be necessary for epidemiological  and / or clinical management purposes  to differentiate between  SARS-CoV-2 and other Sarbecovirus currently known to infect humans.  If clinically indicated additional testing with an alternate test  methodology [phone]) is advised. The SARS-CoV-2 RNA is generally  detectable in upper and lower respiratory sp ecimens during the acute  phase of infection. The expected result is Negative. Fact Sheet for Patients:  StrictlyIdeas.no Fact Sheet for Healthcare Providers: BankingDealers.co.za This test is not yet approved or cleared by the [place] FDA and has been authorized for detection and/or diagnosis of SARS-CoV-2 by FDA under an Emergency Use Authorization (EUA).  This EUA will remain in effect (meaning this test can be used) for the duration of the COVID-19 declaration under Section 564(b)(1) of the Act, 21 U.S.C. section 360bbb-3(b)(1), unless the authorization is terminated or revoked sooner. Performed at [facility], [address], [place], [place] [address]   Urine Culture     Status: Abnormal   Collection Time: 11/24/18  2:55  PM   Specimen: Urine, Catheterized  Result Value Ref Range Status   Specimen Description   Final    URINE,  CATHETERIZED Performed at [facility], [address] [place], [place] [address]    Special Requests   Final    NONE Performed at [facility], [place], [place] [address]    Culture >=100,000 COLONIES/mL PROTEUS MIRABILIS (A)  Final   Report Status 11/26/2018 FINAL  Final   Organism ID, Bacteria PROTEUS MIRABILIS (A)  Final      Susceptibility   Proteus mirabilis - MIC*    AMPICILLIN <=2 SENSITIVE Sensitive     CEFAZOLIN 8 SENSITIVE Sensitive     CEFTRIAXONE <=1 SENSITIVE Sensitive     CIPROFLOXACIN <=0.25 SENSITIVE Sensitive     GENTAMICIN <=1 SENSITIVE Sensitive     IMIPENEM 4 SENSITIVE Sensitive     NITROFURANTOIN 128 RESISTANT Resistant     TRIMETH/SULFA <=20 SENSITIVE Sensitive     AMPICILLIN/SULBACTAM <=2 SENSITIVE Sensitive     PIP/TAZO <=4 SENSITIVE Sensitive     * >=100,000 COLONIES/mL PROTEUS MIRABILIS  SARS Coronavirus 2 (CEPHEID - Performed in [place] hospital lab), Hosp Order     Status: None   Collection Time: 11/26/18  2:42 PM   Specimen: Nasopharyngeal Swab  Result Value Ref Range Status   SARS Coronavirus 2 NEGATIVE NEGATIVE Final    Comment: (NOTE) If result is NEGATIVE SARS-CoV-2 target nucleic acids are NOT DETECTED. The SARS-CoV-2 RNA is generally detectable in upper and lower  respiratory specimens during the acute phase of infection. The lowest  concentration of SARS-CoV-2 viral copies this assay can detect is 250  copies / mL. A negative result does not preclude SARS-CoV-2 infection  and should not be used as the sole basis for treatment or other  patient management decisions.  A negative result may occur with  improper specimen collection / handling, submission of specimen other  than nasopharyngeal swab, presence of viral mutation(s) within the  areas targeted by this assay, and inadequate number of viral copies  (<250 copies / mL). A negative result must be combined with clinical  observations, patient  history, and epidemiological information. If result is POSITIVE SARS-CoV-2 target nucleic acids are DETECTED. The SARS-CoV-2 RNA is generally detectable in upper and lower  respiratory specimens dur ing the acute phase of infection.  Positive  results are indicative of active infection with SARS-CoV-2.  Clinical  correlation with patient history and other diagnostic information is  necessary to determine patient infection status.  Positive results do  not rule out bacterial infection or co-infection with other viruses. If result is PRESUMPTIVE POSTIVE SARS-CoV-2 nucleic acids MAY BE PRESENT.   A presumptive positive result was obtained on the submitted specimen  and confirmed on repeat testing.  While 2019 novel coronavirus  (SARS-CoV-2) nucleic acids may be present in the submitted sample  additional confirmatory testing may be necessary for epidemiological  and / or clinical management purposes  to differentiate between  SARS-CoV-2 and other Sarbecovirus currently known to infect humans.  If clinically indicated additional testing with an alternate test  methodology [phone]) is advised. The SARS-CoV-2 RNA is generally  detectable in upper and lower respiratory sp ecimens during the acute  phase of infection. The expected result is Negative. Fact Sheet for Patients:  StrictlyIdeas.no Fact Sheet for Healthcare Providers: BankingDealers.co.za This test is not yet approved or cleared by the [place] FDA and has been authorized for detection and/or diagnosis  of SARS-CoV-2 by FDA under an Emergency Use Authorization (EUA).  This EUA will remain in effect (meaning this test can be used) for the duration of the COVID-19 declaration under Section 564(b)(1) of the Act, 21 U.S.C. section 360bbb-3(b)(1), unless the authorization is terminated or revoked sooner. Performed at [facility], [address], [place], [place]  [address]     RADIOLOGY:  No results found.    Management plans discussed with the patient, family and they are in agreement.  CODE STATUS:     Code Status Orders  (From admission, onward)         Start     Ordered   10/19/18 1309  Full code  Continuous     10/19/18 1308          TOTAL TIME TAKING CARE OF THIS PATIENT: 33 minutes.    Demetrios Loll M.D on 11/28/2018 at 11:58 AM  Between 7am to 6pm - Pager - [phone]  After 6pm go to [email or web address] - Proofreader  [facility]  Office  [phone]  CC: Primary care physician; Garwin Brothers, MD

## 2018-11-28 NOTE — Progress Notes (Signed)
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 who presented from his facility unresponsive requiring him to be ventilated in the ED. Patient has been seen in previous years at behavioral health for suicide ideation.     PT Comments    Pt resting in bed upon PT arrival.  Pt spontaneously moving B UE's (R>L) and intermittently wiggling his R great toe.  Pt did not follow any commands.  Performed B LE PROM to increase ROM.  Minimal B knee flexion PROM (very minimal L knee).  L LE with increased tone compared to R LE.  R ankle to close to neutral positioning but L ankle positioned in PF and inversion (unable to get close to neutral positioning with PROM/stretching).  CM arrived during session and reported pt was discharging to facility today.  Deferred OOB mobility d/t pt noted with bowel incontinence requiring clean-up: NT notified.   Follow Up Recommendations  SNF     Equipment Recommendations  (to be determined at next facility)    Recommendations for Other Services       Precautions / Restrictions Precautions Precautions: Fall;Other (comment) Precaution Comments: NPO/PEG, non-verbal Restrictions Weight Bearing Restrictions: No Other Position/Activity Restrictions: bilat resting hand splints on/off - continue to assess for optimal positioning/fitting, skin integrity    Mobility  Bed Mobility                  Transfers                    Ambulation/Gait                 Stairs             Wheelchair Mobility    Modified Rankin (Stroke Patients Only)       Balance                                            Cognition  Arousal/Alertness: Awake/alert Behavior During Therapy: Flat affect Overall Cognitive Status: No family/caregiver present to determine baseline cognitive functioning Area of Impairment: Following commands                       Following Commands: Follows one step commands inconsistently       General Comments: pt visually tracks therapist; spontaneous B UE movement noted and also wiggles R great toe on own      Exercises General Exercises - Lower Extremity Heel Slides: PROM;Both;10 reps Hip ABduction/ADduction: PROM;Both;10 reps Other Exercises Other Exercises: B heelcord stretching 2x30 seconds    General Comments         Pertinent Vitals/Pain Pain Assessment: Faces Faces Pain Scale: No hurt Pain Intervention(s): Limited activity within patient's tolerance;Monitored during session;Repositioned    Home Living                      Prior Function            PT Goals (current goals can now be found in the care plan section) Acute Rehab PT Goals Patient Stated Goal: patient unable PT Goal  Formulation: Patient unable to participate in goal setting Time For Goal Achievement: 12/09/18 Potential to Achieve Goals: Fair Progress towards PT goals: Progressing toward goals(with LE ROM)    Frequency    Min 2X/week      PT Plan Current plan remains appropriate    Co-evaluation              AM-PAC PT "6 Clicks" Mobility   Outcome Measure  Help needed turning from your back to your side while in a flat bed without using bedrails?: Total Help needed moving from lying on your back to sitting on the side of a flat bed without using bedrails?: Total Help needed moving to and from a bed to a chair (including a wheelchair)?: Total Help needed standing up from a chair using your arms (e.g., wheelchair or bedside chair)?: Total Help needed to walk in hospital room?: Total Help needed climbing 3-5 steps with a railing? : Total 6 Click Score: 6    End  of Session   Activity Tolerance: Patient tolerated treatment well Patient left: in bed;with bed alarm set;with call bell/phone within reach(B heels floating via pillows) Nurse Communication: (pt requiring clean-up d/t bowel incontinence) PT Visit Diagnosis: Unsteadiness on feet (R26.81);Other abnormalities of gait and mobility (R26.89);Muscle weakness (generalized) (M62.81);Difficulty in walking, not elsewhere classified (R26.2);Other symptoms and signs involving the nervous system (Z61.096)     Time: 0454-0981 PT Time Calculation (min) (ACUTE ONLY): 15 min  Charges:  $Therapeutic Exercise: 8-22 mins                     Leitha Bleak, PT 11/28/18, 4:34 PM [phone]

## 2018-11-28 NOTE — Progress Notes (Signed)
Called report to Gladstone Lighter LPN at [facility], hearing aid and bilateral hand splints sent with patient in bag.  Foot splint was on at discharge

## 2018-11-28 NOTE — TOC Progression Note (Signed)
Transition of Care [facility]) - Progression Note    Patient Details  Name: [name] MRN: [id number] Date of Birth: 11/11/1955  Transition of Care [facility]) CM/SW Contact  Shelbie Hutching, RN Phone Number: 11/28/2018, 8:54 AM  Clinical Narrative:    This RNCM attempted to contact patient's daughter [name] with no answer.  [name] is patient's sister also listed under emergency contacts, reached out to [facility] and she reports that she will try to get in touch with [name] and have her call RNCM back.  [name] does not know the address of [name] but thinks she lives in [place] will follow up with [facility] management, which is aware of situation.    Expected Discharge Plan: [place] Barriers to Discharge: No SNF bed, SNF Authorization Denied, Barriers Unresolved (comment)(Family plan to private pay for [facility]- no beds available until 6/18)  Expected Discharge Plan and Services Expected Discharge Plan: [place]         Expected Discharge Date: 11/26/18                                     Social Determinants of Health (SDOH) Interventions    Readmission Risk Interventions Readmission Risk Prevention Plan 11/08/2018  Post Dischage Appt Complete  Medication Screening Complete  Transportation Screening Complete  Some recent data might be hidden

## 2018-11-28 NOTE — TOC Transition Note (Signed)
Transition of Care [facility]) - CM/SW Discharge Note   Patient Details  Name: [name] MRN: [id number] Date of Birth: 1956-03-14  Transition of Care [facility]) CM/SW Contact:  Shelbie Hutching, RN Phone Number: 11/28/2018, 4:12 PM   Clinical Narrative:    Daughter has agreed to have patient discharge to [facility].  Patient is going to room 30.  RN to call report to [phone].  Patient will transport by [facility]- RN to arrange after report called.    Final next level of care: [place] Barriers to Discharge: Barriers Resolved   Patient Goals and CMS Choice Patient states their goals for this hospitalization and ongoing recovery are:: "go to skilled nursing at [facility]" CMS Medicare.gov Compare Post Acute Care list provided to:: Patient Represenative (must comment)([name] daughter [phone]) Choice offered to / list presented to : Adult Children  Discharge Placement              Patient chooses bed at: [facility] Patient to be transferred to facility by: [facility] Name of family member notified: Lorelee Cover Patient and family notified of of transfer: 11/28/18  Discharge Plan and Services                                     Social Determinants of Health (SDOH) Interventions     Readmission Risk Interventions Readmission Risk Prevention Plan 11/08/2018  Post Dischage Appt Complete  Medication Screening Complete  Transportation Screening Complete  Some recent data might be hidden

## 2018-11-28 NOTE — Progress Notes (Signed)
Spoke with Dr Verdell Carmine patient is suppose to go with foley to skilled nursing

## 2018-11-28 NOTE — TOC Progression Note (Signed)
Transition of Care [facility]) - Progression Note    Patient Details  Name: [name] MRN: [id number] Date of Birth: 04-13-1956  Transition of Care [facility]) CM/SW Contact  Rosia Syme, Lenice Llamas Phone Number: [phone]  11/28/2018, 8:22 AM  Clinical Narrative: Clinical Social Worker (CSW) attempted to contact patient's daughter [name] today however she did not answer and a voicemail was left. TOC lead is aware of above.     Expected Discharge Plan: [place] Barriers to Discharge: No SNF bed, SNF Authorization Denied, Barriers Unresolved (comment)(Family plan to private pay for [facility]- no beds available until 6/18)  Expected Discharge Plan and Services Expected Discharge Plan: [place]         Expected Discharge Date: 11/26/18                                     Social Determinants of Health (SDOH) Interventions    Readmission Risk Interventions Readmission Risk Prevention Plan 11/08/2018  Post Dischage Appt Complete  Medication Screening Complete  Transportation Screening Complete  Some recent data might be hidden

## 2018-11-28 NOTE — TOC Progression Note (Addendum)
Transition of Care [facility]) - Progression Note    Patient Details  Name: [name] MRN: [id number] Date of Birth: 04-19-1956  Transition of Care [facility]) CM/SW Contact  Delshon Blanchfield, Lenice Llamas Phone Number: [phone]  11/28/2018, 9:46 AM  Clinical Narrative: Clinical Social Worker (CSW) has been unable to reach patient's daughter [name] for 24 hours now. CSW made an Adult Scientist, forensic (APS) in [place].    Per [facility] APS intake supervisor the case will be screened out because TOC lead was able to reach patient's daughter today.    Expected Discharge Plan: [place] Barriers to Discharge: No SNF bed, SNF Authorization Denied, Barriers Unresolved (comment)(Family plan to private pay for [facility]- no beds available until 6/18)  Expected Discharge Plan and Services Expected Discharge Plan: [place]         Expected Discharge Date: 11/26/18                                     Social Determinants of Health (SDOH) Interventions    Readmission Risk Interventions Readmission Risk Prevention Plan 11/08/2018  Post Dischage Appt Complete  Medication Screening Complete  Transportation Screening Complete  Some recent data might be hidden

## 2018-11-28 NOTE — Progress Notes (Signed)
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 who presented from his facility unresponsive requiring him to be ventilated in the ED. Patient has been seen in previous years at behavioral health for suicide ideation.    OT comments  Pt. performed hand to face patterns using the left hadn with max hand-over hand assist in preparation for light grooming tasks. Pt. required maxA to comb hair. With assist and support to initiate, pt. was then able to complete several strokes. PROM was performed to the LUE, in all joint ranges. Resting hand splint was applied to the left hand only, as pt. was moving, and using his right hand to reach his face, head, and covers. Education was provided to pt.'s nursing assistant about splint care, application, recommended wearing schedule, and skin checks with the goal to promote good skin integrity through the palmar surface of hand, and digits. Pt. continues benefit from OT services for ADL training, and pt./caregiver education about positioning, and splint care.   Follow Up Recommendations  [facility]    Equipment Recommendations  Hospital bed;Other (comment)    Recommendations for Other Services      Precautions / Restrictions Precautions Precautions: Fall;Other (comment) Restrictions Weight Bearing Restrictions: No Other Position/Activity Restrictions: bilat resting hand splints on/off - continue to assess for optimal positioning/fitting, skin integrity       Mobility Bed Mobility Overal bed mobility: Needs Assistance Bed Mobility: Rolling Rolling: Total assist         General bed mobility comments: total assist for all repositioning, pressure relief and  any attempts at mobility  Transfers Overall transfer level: Needs assistance                    Balance       Sitting balance - Comments: deferred                                   ADL either performed or assessed with clinical judgement   ADL Overall ADL's : Needs assistance/impaired     Grooming: Maximal assistance   Upper Body Bathing: Total assistance   Lower Body Bathing: Total assistance   Upper Body Dressing : Total assistance   Lower Body Dressing: Total assistance                       Vision   Vision Assessment?: Vision impaired- to be further tested in functional context   Perception     Praxis      Cognition Arousal/Alertness: Awake/alert Behavior During Therapy: Flat affect Overall Cognitive Status: No family/caregiver present to determine baseline cognitive functioning Area of Impairment: Following commands                       Following Commands: Follows multi-step commands inconsistently                Exercises     Shoulder Instructions       General Comments      Pertinent Vitals/ Pain       Pain Assessment: Faces Pain Score: 1  Pain Location: Less grimacing today. Grimacing initially with  left 4th digit ROM. Pain Descriptors / Indicators: Aching Pain Intervention(s): Limited activity within patient's tolerance;Monitored during session  Home Living                                          Prior Functioning/Environment              Frequency  Min 2X/week        Progress Toward Goals  OT Goals(current goals can now be found in the care plan section)  Progress towards OT goals: Progressing toward goals  Acute Rehab OT Goals Patient Stated Goal: patient unable OT Goal Formulation: Patient unable to participate in goal setting  Plan Discharge plan remains appropriate;Frequency remains appropriate    Co-evaluation                 AM-PAC OT "6  Clicks" Daily Activity     Outcome Measure   Help from another person eating meals?: Total Help from another person taking care of personal grooming?: A Lot Help from another person toileting, which includes using toliet, bedpan, or urinal?: Total Help from another person bathing (including washing, rinsing, drying)?: Total Help from another person to put on and taking off regular upper body clothing?: Total Help from another person to put on and taking off regular lower body clothing?: Total 6 Click Score: 7    End of Session Equipment Utilized During Treatment: Other (comment)  OT Visit Diagnosis: Other abnormalities of gait and mobility (R26.89);Cognitive communication deficit (R41.841);Muscle weakness (generalized) (M62.81);Apraxia (R48.2) Symptoms and signs involving cognitive functions: Other cerebrovascular disease   Activity Tolerance Patient tolerated treatment well   Patient Left in bed;with call bell/phone within reach;with bed alarm set;with nursing/sitter in room   Nurse Communication          Time: 1856-3149 OT Time Calculation (min): 25 min  Charges: OT General Charges $OT Visit: 1 Visit OT Treatments $Self Care/Home Management : 23-37 mins  Harrel Carina, MS, OTR/L  Harrel Carina 11/28/2018, 10:46 AM

## 2018-11-29 ENCOUNTER — Encounter: Payer: Self-pay | Admitting: Nurse Practitioner

## 2018-11-29 ENCOUNTER — Non-Acute Institutional Stay: Payer: Medicare HMO | Admitting: Nurse Practitioner

## 2018-11-29 VITALS — BP 115/72 | HR 88 | Temp 98.3°F | Resp 20 | Ht 70.0 in | Wt 220.0 lb

## 2018-11-29 DIAGNOSIS — Z515 Encounter for palliative care: Secondary | ICD-10-CM | POA: Diagnosis not present

## 2018-11-29 NOTE — Progress Notes (Signed)
[name]: [phone]  Fax: [phone]  PATIENT NAME: [name] DOB: 17-Jul-1955 MRN: [id number]  PRIMARY CARE PROVIDER:   Garwin Brothers, MD  REFERRING PROVIDER:  Dr Hodges/[facility] RESPONSIBLE PARTY:   [name] daughter [phone]  I was asked by Dr Nyra Capes to see [name] for [facility] consult for [place] and PLAN:  1.Palliative care encounter Z51.5; Palliative medicine team will continue to support patient, patient's family, and medical team. Visit consisted of counseling and education dealing with the complex and emotionally intense issues of symptom management and palliative care in the setting of serious and potentially life-threatening illness  ASSESSMENT:     4 / 21 / 2,020 weight 231.7 lbs 5 / 4 / 2,020 weight 231.7 lbs 7 / 2 / 2,020 weight 220.0 lbs BMI 31.9  5 / 23 / 2020 WBC 17.6, hemoglobin 11.5, hematocrit 35.9, platelets 313, sodium 150, potassium 5.9, chloride 109, CO2 19, calcium 8.1, bun 89.9, creatinine 9.07, glucose 31  I visited and observed [name]. He did open his eyes to verbal cues but no verbal response or eye contact. He was cooperative with assessment. He is completely debilitated functionally and cognitively. He does currently appear to be comfortable. I have attempted to contact his daughter for further discussion of goals of care and code status. I updated staff and any changes to current goals or plan of care until further discussion with daughter.  I spent 60 minutes providing this consultation,  from 1:00pm to 2:00pm. More than 50% of the time in this consultation was spent coordinating communication.   HISTORY OF PRESENT ILLNESS:  [name] is a 63 y.o. 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 Hospitalized 5 / 23 / 2020 to 7 / 2 / 2020 altered mental status secondary to hypoglycemia with workup significant for acute encephalopathy requiring mechanical ventilation. CT scan brain bilateral e n c e p h Alo malacia with left frontal craniotomy with history previous Strokes. MRI could not be completed due to recent clipping. Seeing neurology with no CVA workup continued on Keppra for seizures and periodically. He was started on Provigil. Peg tube placement 6/5 / 2020. He was started on baclofen for rigidity of upper and lower extremities. Urinary tract infection positive for Proteus treated with antibiotic therapy. Acute renal failure secondary to acute renal retention with history of BPH with Nephrology consulted in [facility]. Bilateral hydronephrosis on renal ultrasound. Poor oral nutrition so peg tube was placed. Low-grade fever suspected from UTI. He was discharged to short-term rehab at [facility] where he currently resides. He has been found, total ADL dependence. He is contracted left arm and minimal bilateral hands with some absence of hand grips. He needs to be turned, position by staff. He is incontinent bowel and bladder. He is mute unable to verbalize needs, he is alert with the ability to nod his head to perform gestures. He does have a gastrostomy tube for tube feeding essential to sustain life with osmolite. At present he  is lying in bed. He has his eyes closed. He appears debilitated, chronically ill but comfortable. No visitors present. Palliative Care was asked to help address goals of  care.   CODE STATUS: full  PPS: 30% HOSPICE ELIGIBILITY/DIAGNOSIS: TBD  PAST MEDICAL HISTORY:  Past Medical History:  Diagnosis Date  . Allergy   . Anxiety   . Cataract   . Cerebral hemorrhage ([place])   . Chest pain, atypical    12/2003:  negative cardiolyte  . Depression   . Drug abuse ([place])   . Eczema   . Erectile dysfunction   . History of meniscal tear    bilateral  . HLD (hyperlipidemia)   . HTN (hypertension)   . Hx of tear of ACL (anterior cruciate ligament)    right  . Insomnia   . Olecranon bursitis of left elbow 10/2009   s/p I&D by Dr Maxie Better, initially assessed by Dr. Nori Riis   . Prediabetes   . Stroke ([place])   . Suicidal behavior 04/13/2017   Pt states taking a bunch of sleeping pills to end life   . TIA (transient ischemic attack)     SOCIAL HX:  Social History   Tobacco Use  . Smoking status: Never Smoker  . Smokeless tobacco: Never Used  Substance Use Topics  . Alcohol use: No    Alcohol/week: 0.0 standard drinks    ALLERGIES:  Allergies  Allergen Reactions  . Metformin Diarrhea     PERTINENT MEDICATIONS:  Outpatient Encounter Medications as of 11/29/2018  Medication Sig  . Amino Acids-Protein Hydrolys (FEEDING SUPPLEMENT, PRO-STAT SUGAR FREE 64,) LIQD Place 30 mLs into feeding tube 2 (two) times daily.  Marland Kitchen atorvastatin (LIPITOR) 40 MG tablet Take 40 mg by mouth every evening.  . baclofen (LIORESAL) 10 MG tablet Take 1 tablet (10 mg total) by mouth 3 (three) times daily.  . carvedilol (COREG) 25 MG tablet Take 25 mg by mouth 2 (two) times daily with a meal.  . cephALEXin (KEFLEX) 500 MG capsule Take 1 capsule (500 mg total) by mouth 4 (four) times daily for 4 days.  Marland Kitchen doxazosin (CARDURA) 2 MG tablet Take 2 mg by mouth at bedtime.  . finasteride (PROSCAR) 5 MG tablet Take 1 tablet (5 mg total) by mouth at bedtime.  . levETIRAcetam (KEPPRA) 750 MG tablet Take 750 mg by mouth 2 (two) times daily.  . modafinil (PROVIGIL) 200 MG tablet Place 1 tablet (200 mg  total) into feeding tube 2 (two) times a day.  . mouth rinse LIQD solution 15 mLs by Mouth Rinse route 2 times daily at 12 noon and 4 pm.  . nutrition supplement, JUVEN, (JUVEN) PACK Place 1 packet into feeding tube 2 (two) times daily between meals.  . Nutritional Supplements (FEEDING SUPPLEMENT, OSMOLITE 1.5 CAL,) LIQD Place 1,000 mLs into feeding tube continuous.  . polyethylene glycol (MIRALAX / GLYCOLAX) 17 g packet Take 17 g by mouth daily as needed for mild constipation.  . Water For Irrigation, Sterile (FREE WATER) SOLN Place 150 mLs into feeding tube every 4 (four) hours.  . [DISCONTINUED] albuterol (PROVENTIL) (2.5 MG/3ML) 0.083% nebulizer solution Take 2.5 mg by nebulization every 4 (four) hours as needed for wheezing or shortness of breath.  . [DISCONTINUED] famotidine (PEPCID) 20 MG tablet Take 20 mg by mouth 2 (two) times daily.   No facility-administered encounter medications on file as of 11/29/2018.     PHYSICAL EXAM:   General: NAD, severely debilitated cognitive and functionally Cardiovascular: regular rate and  rhythm Pulmonary: clear ant fields Abdomen: soft, nontender, + bowel sounds/g-tube GU: no suprapubic tenderness Extremities: no edema, no joint deformities Skin: no rashes Neurological: Weakness but otherwise nonfocal/functionally quadriplegic  Krystyn Picking Ihor Gully, NP

## 2018-12-02 ENCOUNTER — Other Ambulatory Visit: Payer: Self-pay

## 2018-12-04 ENCOUNTER — Non-Acute Institutional Stay: Payer: Medicare HMO | Admitting: Nurse Practitioner

## 2018-12-04 ENCOUNTER — Encounter: Payer: Self-pay | Admitting: Nurse Practitioner

## 2018-12-04 VITALS — HR 82 | Temp 98.0°F | Resp 18

## 2018-12-04 DIAGNOSIS — Z515 Encounter for palliative care: Secondary | ICD-10-CM

## 2018-12-04 DIAGNOSIS — I69359 Hemiplegia and hemiparesis following cerebral infarction affecting unspecified side: Secondary | ICD-10-CM | POA: Diagnosis not present

## 2018-12-04 DIAGNOSIS — N390 Urinary tract infection, site not specified: Secondary | ICD-10-CM | POA: Diagnosis not present

## 2018-12-04 DIAGNOSIS — R5381 Other malaise: Secondary | ICD-10-CM | POA: Diagnosis not present

## 2018-12-04 DIAGNOSIS — E11649 Type 2 diabetes mellitus with hypoglycemia without coma: Secondary | ICD-10-CM | POA: Diagnosis not present

## 2018-12-04 NOTE — Progress Notes (Signed)
[place] Consult Note Telephone: [phone]  Fax: [phone]  PATIENT NAME: [name] DOB: 1955-07-04 MRN: [id number]  PRIMARY CARE PROVIDER:   Garwin Brothers, MD  REFERRING PROVIDER:  Dr [facility] RESPONSIBLE PARTY:[name] Hoover daughter [phone]  RECOMMENDATIONS and PLAN:  1.Palliative care encounter Z51.5; Palliative medicine team will continue to support patient, patient's family, and medical team. Visit consisted of counseling and education dealing with the complex and emotionally intense issues of symptom management and palliative care in the setting of serious and potentially life-threatening illness     ASSESSMENT:     I visited and observed [name]. No meaningful discussion did you connect to the impairment, nonverbal. He did have his eyes open and he did not focus. He was cooperative with assessment. He is severely debilitated, functionally and cognitively. He does remain a full code. Emotional support provided. I have attempted to contact Allen his daughter for discussion of goc, code status, message left. I updated nursing staff  I spent 30 minutes providing this consultation,  from 1:00pm to 1:30pm. More than 50% of the time in this consultation was spent coordinating communication.   HISTORY OF PRESENT ILLNESS:  [name] is a 63 y.o. 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  Hospitalized 5 / 23 / 2020 to 7 / 2 / 2020 altered mental status secondary to hypoglycemia with workup significant for acute encephalopathy requiring mechanical ventilation. CT scan brain bilateral encephalomalacia with left frontal craniotomy with history previous Strokes. MRI could not be completed due to recent clipping. Seeing neurology with no CVA workup continued on Keppra for seizures and periodically. He was started on Provigil. Peg tube placement 6/5 / 2020. He was started on baclofen for rigidity of upper and lower extremities. Urinary tract infection positive for Proteus treated with antibiotic therapy. Acute renal failure secondary to acute renal retention with history of BPH with Nephrology consulted in Foley catheterization Place. Bilateral hydronephrosis on renal ultrasound. Poor oral nutrition so peg tube was placed. Low-grade fever suspected from UTI. He was discharged to short-term rehab at [facility] where he currently resides. He has been found, total ADL dependence. He is contracted left arm and minimal bilateral hands with some absence of hand grips. He needs to be turned, position by staff. He is incontinent bowel and bladder. [name] continues to reside in [facility] facility [facility]. He does remain bed bound, total ADL dependence with incontinence. He does wear a splint on his left wrist. Staff endorses he has been a little more alert today. His eyes have been open and he has been moving his upper extremity. He does not follow commands her staff. At present he is lying in bed with his eyes open. He appears debilitated, chronically ill but no distress. No visitors present. Palliative Care was asked to help to continue to address goals of care.   CODE STATUS: full code  PPS: 30% HOSPICE ELIGIBILITY/DIAGNOSIS: TBD  PAST MEDICAL HISTORY:  Past Medical History:  Diagnosis Date  . Allergy   .  Anxiety   . Cataract   . Cerebral  hemorrhage ([place])   . Chest pain, atypical    12/2003:  negative cardiolyte  . Depression   . Drug abuse ([place])   . Eczema   . Erectile dysfunction   . History of meniscal tear    bilateral  . HLD (hyperlipidemia)   . HTN (hypertension)   . Hx of tear of ACL (anterior cruciate ligament)    right  . Insomnia   . Olecranon bursitis of left elbow 10/2009   s/p I&D by Dr Maxie Better, initially assessed by Dr. Nori Riis   . Prediabetes   . Stroke ([place])   . Suicidal behavior 04/13/2017   Pt states taking a bunch of sleeping pills to end life   . TIA (transient ischemic attack)     SOCIAL HX:  Social History   Tobacco Use  . Smoking status: Never Smoker  . Smokeless tobacco: Never Used  Substance Use Topics  . Alcohol use: No    Alcohol/week: 0.0 standard drinks    ALLERGIES:  Allergies  Allergen Reactions  . Metformin Diarrhea     PERTINENT MEDICATIONS:  Outpatient Encounter Medications as of 12/04/2018  Medication Sig  . Amino Acids-Protein Hydrolys (FEEDING SUPPLEMENT, PRO-STAT SUGAR FREE 64,) LIQD Place 30 mLs into feeding tube 2 (two) times daily.  Marland Kitchen atorvastatin (LIPITOR) 40 MG tablet Take 40 mg by mouth every evening.  . baclofen (LIORESAL) 10 MG tablet Take 1 tablet (10 mg total) by mouth 3 (three) times daily.  . carvedilol (COREG) 25 MG tablet Take 25 mg by mouth 2 (two) times daily with a meal.  . doxazosin (CARDURA) 2 MG tablet Take 2 mg by mouth at bedtime.  . finasteride (PROSCAR) 5 MG tablet Take 1 tablet (5 mg total) by mouth at bedtime.  . levETIRAcetam (KEPPRA) 750 MG tablet Take 750 mg by mouth 2 (two) times daily.  . modafinil (PROVIGIL) 200 MG tablet Place 1 tablet (200 mg total) into feeding tube 2 (two) times a day.  . mouth rinse LIQD solution 15 mLs by Mouth Rinse route 2 times daily at 12 noon and 4 pm.  . nutrition supplement, JUVEN, (JUVEN) PACK Place 1 packet into feeding tube 2 (two) times daily between meals.  . Nutritional Supplements (FEEDING SUPPLEMENT,  OSMOLITE 1.5 CAL,) LIQD Place 1,000 mLs into feeding tube continuous.  . polyethylene glycol (MIRALAX / GLYCOLAX) 17 g packet Take 17 g by mouth daily as needed for mild constipation.  . Water For Irrigation, Sterile (FREE WATER) SOLN Place 150 mLs into feeding tube every 4 (four) hours.   No facility-administered encounter medications on file as of 12/04/2018.     PHYSICAL EXAM:   General: severely debilitated, chronically ill, bedbound cognitively impaired male Cardiovascular: regular rate and rhythm Pulmonary: decrease throughout Abdomen: soft, nontender, + bowel sounds GU: no suprapubic tenderness Extremities: contracted Skin: no rashes Neurological: Weakness but otherwise nonfocal/functionally quadriplegic  Joory Gough Ihor Gully, NP

## 2018-12-05 ENCOUNTER — Other Ambulatory Visit: Payer: Self-pay

## 2018-12-09 DIAGNOSIS — L89892 Pressure ulcer of other site, stage 2: Secondary | ICD-10-CM | POA: Diagnosis not present

## 2018-12-09 DIAGNOSIS — D72829 Elevated white blood cell count, unspecified: Secondary | ICD-10-CM | POA: Diagnosis not present

## 2018-12-09 DIAGNOSIS — R4182 Altered mental status, unspecified: Secondary | ICD-10-CM | POA: Diagnosis not present

## 2018-12-10 DIAGNOSIS — E119 Type 2 diabetes mellitus without complications: Secondary | ICD-10-CM | POA: Diagnosis not present

## 2018-12-10 DIAGNOSIS — R5381 Other malaise: Secondary | ICD-10-CM | POA: Diagnosis not present

## 2018-12-10 DIAGNOSIS — I69359 Hemiplegia and hemiparesis following cerebral infarction affecting unspecified side: Secondary | ICD-10-CM | POA: Diagnosis not present

## 2018-12-10 DIAGNOSIS — Z931 Gastrostomy status: Secondary | ICD-10-CM | POA: Diagnosis not present

## 2018-12-10 DIAGNOSIS — I69320 Aphasia following cerebral infarction: Secondary | ICD-10-CM | POA: Diagnosis not present

## 2018-12-13 DIAGNOSIS — R0989 Other specified symptoms and signs involving the circulatory and respiratory systems: Secondary | ICD-10-CM | POA: Diagnosis not present

## 2018-12-13 DIAGNOSIS — Z20828 Contact with and (suspected) exposure to other viral communicable diseases: Secondary | ICD-10-CM | POA: Diagnosis not present

## 2018-12-13 DIAGNOSIS — D649 Anemia, unspecified: Secondary | ICD-10-CM | POA: Diagnosis not present

## 2018-12-13 DIAGNOSIS — R509 Fever, unspecified: Secondary | ICD-10-CM | POA: Diagnosis not present

## 2018-12-14 DIAGNOSIS — N390 Urinary tract infection, site not specified: Secondary | ICD-10-CM | POA: Diagnosis not present

## 2018-12-14 DIAGNOSIS — Z79899 Other long term (current) drug therapy: Secondary | ICD-10-CM | POA: Diagnosis not present

## 2018-12-14 DIAGNOSIS — R509 Fever, unspecified: Secondary | ICD-10-CM | POA: Diagnosis not present

## 2018-12-14 DIAGNOSIS — E119 Type 2 diabetes mellitus without complications: Secondary | ICD-10-CM | POA: Diagnosis not present

## 2018-12-16 DIAGNOSIS — E119 Type 2 diabetes mellitus without complications: Secondary | ICD-10-CM | POA: Diagnosis not present

## 2018-12-16 DIAGNOSIS — L89892 Pressure ulcer of other site, stage 2: Secondary | ICD-10-CM | POA: Diagnosis not present

## 2018-12-16 DIAGNOSIS — D649 Anemia, unspecified: Secondary | ICD-10-CM | POA: Diagnosis not present

## 2018-12-23 DIAGNOSIS — L89892 Pressure ulcer of other site, stage 2: Secondary | ICD-10-CM | POA: Diagnosis not present

## 2018-12-27 DIAGNOSIS — E119 Type 2 diabetes mellitus without complications: Secondary | ICD-10-CM | POA: Diagnosis not present

## 2018-12-27 DIAGNOSIS — R509 Fever, unspecified: Secondary | ICD-10-CM | POA: Diagnosis not present

## 2018-12-27 DIAGNOSIS — N390 Urinary tract infection, site not specified: Secondary | ICD-10-CM | POA: Diagnosis not present

## 2018-12-30 DIAGNOSIS — L89892 Pressure ulcer of other site, stage 2: Secondary | ICD-10-CM | POA: Diagnosis not present

## 2019-01-03 DIAGNOSIS — Z20828 Contact with and (suspected) exposure to other viral communicable diseases: Secondary | ICD-10-CM | POA: Diagnosis not present

## 2019-01-06 DIAGNOSIS — L89892 Pressure ulcer of other site, stage 2: Secondary | ICD-10-CM | POA: Diagnosis not present

## 2019-01-10 DIAGNOSIS — E119 Type 2 diabetes mellitus without complications: Secondary | ICD-10-CM | POA: Diagnosis not present

## 2019-01-13 DIAGNOSIS — Z931 Gastrostomy status: Secondary | ICD-10-CM | POA: Diagnosis not present

## 2019-01-13 DIAGNOSIS — K9420 Gastrostomy complication, unspecified: Secondary | ICD-10-CM | POA: Diagnosis not present

## 2019-01-13 DIAGNOSIS — L89892 Pressure ulcer of other site, stage 2: Secondary | ICD-10-CM | POA: Diagnosis not present

## 2019-01-21 ENCOUNTER — Encounter: Payer: Self-pay | Admitting: Emergency Medicine

## 2019-01-21 ENCOUNTER — Inpatient Hospital Stay
Admission: EM | Admit: 2019-01-21 | Discharge: 2019-01-24 | DRG: 698 | Disposition: A | Discharge status: Skilled Nursing Facility | Payer: Medicare HMO | Source: Skilled Nursing Facility | Attending: Internal Medicine | Admitting: Internal Medicine

## 2019-01-21 ENCOUNTER — Emergency Department: Payer: Medicare HMO

## 2019-01-21 DIAGNOSIS — L89899 Pressure ulcer of other site, unspecified stage: Secondary | ICD-10-CM | POA: Diagnosis present

## 2019-01-21 DIAGNOSIS — L03317 Cellulitis of buttock: Secondary | ICD-10-CM

## 2019-01-21 DIAGNOSIS — A4189 Other specified sepsis: Secondary | ICD-10-CM | POA: Diagnosis not present

## 2019-01-21 DIAGNOSIS — E785 Hyperlipidemia, unspecified: Secondary | ICD-10-CM | POA: Diagnosis not present

## 2019-01-21 DIAGNOSIS — B029 Zoster without complications: Secondary | ICD-10-CM | POA: Diagnosis present

## 2019-01-21 DIAGNOSIS — Z79899 Other long term (current) drug therapy: Secondary | ICD-10-CM

## 2019-01-21 DIAGNOSIS — R404 Transient alteration of awareness: Secondary | ICD-10-CM | POA: Diagnosis not present

## 2019-01-21 DIAGNOSIS — N390 Urinary tract infection, site not specified: Secondary | ICD-10-CM | POA: Diagnosis present

## 2019-01-21 DIAGNOSIS — N401 Enlarged prostate with lower urinary tract symptoms: Secondary | ICD-10-CM | POA: Diagnosis present

## 2019-01-21 DIAGNOSIS — Y731 Therapeutic (nonsurgical) and rehabilitative gastroenterology and urology devices associated with adverse incidents: Secondary | ICD-10-CM | POA: Diagnosis present

## 2019-01-21 DIAGNOSIS — E860 Dehydration: Secondary | ICD-10-CM | POA: Diagnosis present

## 2019-01-21 DIAGNOSIS — E870 Hyperosmolality and hypernatremia: Secondary | ICD-10-CM | POA: Diagnosis present

## 2019-01-21 DIAGNOSIS — L89150 Pressure ulcer of sacral region, unstageable: Secondary | ICD-10-CM | POA: Diagnosis present

## 2019-01-21 DIAGNOSIS — B964 Proteus (mirabilis) (morganii) as the cause of diseases classified elsewhere: Secondary | ICD-10-CM | POA: Diagnosis present

## 2019-01-21 DIAGNOSIS — Z931 Gastrostomy status: Secondary | ICD-10-CM

## 2019-01-21 DIAGNOSIS — R399 Unspecified symptoms and signs involving the genitourinary system: Secondary | ICD-10-CM | POA: Diagnosis present

## 2019-01-21 DIAGNOSIS — Z66 Do not resuscitate: Secondary | ICD-10-CM | POA: Diagnosis not present

## 2019-01-21 DIAGNOSIS — Z8673 Personal history of transient ischemic attack (TIA), and cerebral infarction without residual deficits: Secondary | ICD-10-CM | POA: Diagnosis not present

## 2019-01-21 DIAGNOSIS — L89319 Pressure ulcer of right buttock, unspecified stage: Secondary | ICD-10-CM | POA: Diagnosis present

## 2019-01-21 DIAGNOSIS — G459 Transient cerebral ischemic attack, unspecified: Secondary | ICD-10-CM | POA: Diagnosis not present

## 2019-01-21 DIAGNOSIS — Z825 Family history of asthma and other chronic lower respiratory diseases: Secondary | ICD-10-CM

## 2019-01-21 DIAGNOSIS — I110 Hypertensive heart disease with heart failure: Secondary | ICD-10-CM | POA: Diagnosis present

## 2019-01-21 DIAGNOSIS — Z823 Family history of stroke: Secondary | ICD-10-CM | POA: Diagnosis not present

## 2019-01-21 DIAGNOSIS — R52 Pain, unspecified: Secondary | ICD-10-CM | POA: Diagnosis not present

## 2019-01-21 DIAGNOSIS — R7303 Prediabetes: Secondary | ICD-10-CM | POA: Diagnosis present

## 2019-01-21 DIAGNOSIS — Z022 Encounter for examination for admission to residential institution: Secondary | ICD-10-CM | POA: Diagnosis present

## 2019-01-21 DIAGNOSIS — R4182 Altered mental status, unspecified: Secondary | ICD-10-CM | POA: Diagnosis not present

## 2019-01-21 DIAGNOSIS — K625 Hemorrhage of anus and rectum: Secondary | ICD-10-CM | POA: Diagnosis not present

## 2019-01-21 DIAGNOSIS — R0902 Hypoxemia: Secondary | ICD-10-CM | POA: Diagnosis not present

## 2019-01-21 DIAGNOSIS — M2550 Pain in unspecified joint: Secondary | ICD-10-CM | POA: Diagnosis not present

## 2019-01-21 DIAGNOSIS — I5032 Chronic diastolic (congestive) heart failure: Secondary | ICD-10-CM | POA: Diagnosis present

## 2019-01-21 DIAGNOSIS — Z833 Family history of diabetes mellitus: Secondary | ICD-10-CM

## 2019-01-21 DIAGNOSIS — A419 Sepsis, unspecified organism: Secondary | ICD-10-CM | POA: Diagnosis not present

## 2019-01-21 DIAGNOSIS — R739 Hyperglycemia, unspecified: Secondary | ICD-10-CM | POA: Diagnosis present

## 2019-01-21 DIAGNOSIS — R509 Fever, unspecified: Secondary | ICD-10-CM | POA: Diagnosis not present

## 2019-01-21 DIAGNOSIS — Z7401 Bed confinement status: Secondary | ICD-10-CM | POA: Diagnosis not present

## 2019-01-21 DIAGNOSIS — L89129 Pressure ulcer of left upper back, unspecified stage: Secondary | ICD-10-CM

## 2019-01-21 DIAGNOSIS — Z20828 Contact with and (suspected) exposure to other viral communicable diseases: Secondary | ICD-10-CM | POA: Diagnosis present

## 2019-01-21 DIAGNOSIS — T83511A Infection and inflammatory reaction due to indwelling urethral catheter, initial encounter: Secondary | ICD-10-CM | POA: Diagnosis not present

## 2019-01-21 DIAGNOSIS — I10 Essential (primary) hypertension: Secondary | ICD-10-CM | POA: Diagnosis not present

## 2019-01-21 DIAGNOSIS — F0390 Unspecified dementia without behavioral disturbance: Secondary | ICD-10-CM | POA: Diagnosis present

## 2019-01-21 DIAGNOSIS — Z8249 Family history of ischemic heart disease and other diseases of the circulatory system: Secondary | ICD-10-CM

## 2019-01-21 DIAGNOSIS — L89119 Pressure ulcer of right upper back, unspecified stage: Secondary | ICD-10-CM | POA: Diagnosis not present

## 2019-01-21 DIAGNOSIS — Z888 Allergy status to other drugs, medicaments and biological substances status: Secondary | ICD-10-CM

## 2019-01-21 DIAGNOSIS — E119 Type 2 diabetes mellitus without complications: Secondary | ICD-10-CM | POA: Diagnosis not present

## 2019-01-21 LAB — CBC WITH DIFFERENTIAL/PLATELET
Abs Immature Granulocytes: 0.06 10*3/uL (ref 0.00–0.07)
Basophils Absolute: 0.1 10*3/uL (ref 0.0–0.1)
Basophils Relative: 0 %
Eosinophils Absolute: 0.9 10*3/uL — ABNORMAL HIGH (ref 0.0–0.5)
Eosinophils Relative: 5 %
HCT: 41.8 % (ref 39.0–52.0)
Hemoglobin: 12.3 g/dL — ABNORMAL LOW (ref 13.0–17.0)
Immature Granulocytes: 0 %
Lymphocytes Relative: 11 %
Lymphs Abs: 1.9 10*3/uL (ref 0.7–4.0)
MCH: 25.8 pg — ABNORMAL LOW (ref 26.0–34.0)
MCHC: 29.4 g/dL — ABNORMAL LOW (ref 30.0–36.0)
MCV: 87.8 fL (ref 80.0–100.0)
Monocytes Absolute: 1.3 10*3/uL — ABNORMAL HIGH (ref 0.1–1.0)
Monocytes Relative: 8 %
Neutro Abs: 13.1 10*3/uL — ABNORMAL HIGH (ref 1.7–7.7)
Neutrophils Relative %: 76 %
Platelets: 455 10*3/uL — ABNORMAL HIGH (ref 150–400)
RBC: 4.76 MIL/uL (ref 4.22–5.81)
RDW: 15.5 % (ref 11.5–15.5)
WBC: 17.4 10*3/uL — ABNORMAL HIGH (ref 4.0–10.5)
nRBC: 0 % (ref 0.0–0.2)

## 2019-01-21 LAB — URINALYSIS, COMPLETE (UACMP) WITH MICROSCOPIC
Bilirubin Urine: NEGATIVE
Glucose, UA: NEGATIVE mg/dL
Hgb urine dipstick: NEGATIVE
Ketones, ur: NEGATIVE mg/dL
Nitrite: POSITIVE — AB
Protein, ur: 100 mg/dL — AB
Specific Gravity, Urine: 1.019 (ref 1.005–1.030)
Squamous Epithelial / HPF: NONE SEEN (ref 0–5)
pH: 8 (ref 5.0–8.0)

## 2019-01-21 LAB — PROTIME-INR
INR: 1.1 (ref 0.8–1.2)
Prothrombin Time: 14.1 seconds (ref 11.4–15.2)

## 2019-01-21 LAB — COMPREHENSIVE METABOLIC PANEL
ALT: 33 U/L (ref 0–44)
AST: 19 U/L (ref 15–41)
Albumin: 3.2 g/dL — ABNORMAL LOW (ref 3.5–5.0)
Alkaline Phosphatase: 92 U/L (ref 38–126)
Anion gap: 9 (ref 5–15)
BUN: 24 mg/dL — ABNORMAL HIGH (ref 8–23)
CO2: 29 mmol/L (ref 22–32)
Calcium: 9.3 mg/dL (ref 8.9–10.3)
Chloride: 108 mmol/L (ref 98–111)
Creatinine, Ser: 0.44 mg/dL — ABNORMAL LOW (ref 0.61–1.24)
GFR calc Af Amer: >60 mL/min (ref >60)
GFR calc non Af Amer: >60 mL/min (ref >60)
Glucose, Bld: 137 mg/dL — ABNORMAL HIGH (ref 70–99)
Potassium: 4.2 mmol/L (ref 3.5–5.1)
Sodium: 146 mmol/L — ABNORMAL HIGH (ref 135–145)
Total Bilirubin: 0.7 mg/dL (ref 0.3–1.2)
Total Protein: 7.7 g/dL (ref 6.5–8.1)

## 2019-01-21 LAB — LACTIC ACID, PLASMA
Lactic Acid, Venous: 1.2 mmol/L (ref 0.5–1.9)
Lactic Acid, Venous: 1.2 mmol/L (ref 0.5–1.9)

## 2019-01-21 LAB — TYPE AND SCREEN
ABO/RH(D): A NEG
Antibody Screen: NEGATIVE

## 2019-01-21 MED ORDER — SODIUM CHLORIDE 0.9 % IV SOLN
1.0000 g | INTRAVENOUS | Status: DC
  Administered 2019-01-22 – 2019-01-23 (×3): 1 g via INTRAVENOUS
  Filled 2019-01-21: qty 1
  Filled 2019-01-21: qty 10
  Filled 2019-01-21 (×2): qty 1

## 2019-01-21 MED ORDER — ACETAMINOPHEN 325 MG PO TABS
650.0000 mg | ORAL_TABLET | Freq: Four times a day (QID) | ORAL | Status: DC | PRN
  Administered 2019-01-22: 650 mg
  Filled 2019-01-21: qty 2

## 2019-01-21 MED ORDER — FINASTERIDE 5 MG PO TABS
5.0000 mg | ORAL_TABLET | Freq: Every day | ORAL | Status: DC
  Administered 2019-01-22 – 2019-01-23 (×3): 5 mg via ORAL
  Filled 2019-01-21 (×3): qty 1

## 2019-01-21 MED ORDER — CARVEDILOL 25 MG PO TABS
25.0000 mg | ORAL_TABLET | Freq: Two times a day (BID) | ORAL | Status: DC
  Administered 2019-01-22 – 2019-01-24 (×6): 25 mg via ORAL
  Filled 2019-01-21 (×6): qty 1

## 2019-01-21 MED ORDER — ACETAMINOPHEN 650 MG RE SUPP: 650.0000 mg | Freq: Four times a day (QID) | RECTAL | Status: DC | PRN

## 2019-01-21 MED ORDER — VALACYCLOVIR HCL 500 MG PO TABS
1000.0000 mg | ORAL_TABLET | Freq: Three times a day (TID) | ORAL | Status: DC
  Administered 2019-01-22 – 2019-01-24 (×7): 1000 mg via ORAL
  Filled 2019-01-21 (×10): qty 2

## 2019-01-21 MED ORDER — SODIUM CHLORIDE 0.9 % IV SOLN
INTRAVENOUS | Status: DC
  Administered 2019-01-22: 02:00:00 via INTRAVENOUS

## 2019-01-21 MED ORDER — ALBUTEROL SULFATE (2.5 MG/3ML) 0.083% IN NEBU: 2.5000 mg | INHALATION_SOLUTION | RESPIRATORY_TRACT | Status: DC | PRN

## 2019-01-21 MED ORDER — BACLOFEN 10 MG PO TABS
10.0000 mg | ORAL_TABLET | Freq: Three times a day (TID) | ORAL | Status: DC
  Administered 2019-01-22 – 2019-01-24 (×7): 10 mg via ORAL
  Filled 2019-01-21 (×12): qty 1

## 2019-01-21 MED ORDER — METHYLPHENIDATE HCL 5 MG PO TABS
5.0000 mg | ORAL_TABLET | Freq: Two times a day (BID) | ORAL | Status: DC
  Administered 2019-01-23 – 2019-01-24 (×3): 5 mg via ORAL
  Filled 2019-01-21 (×3): qty 1

## 2019-01-21 MED ORDER — VANCOMYCIN HCL 10 G IV SOLR
2000.0000 mg | Freq: Once | INTRAVENOUS | Status: AC
  Administered 2019-01-21: 2000 mg via INTRAVENOUS
  Filled 2019-01-21: qty 2000

## 2019-01-21 MED ORDER — DOXAZOSIN MESYLATE 2 MG PO TABS
2.0000 mg | ORAL_TABLET | Freq: Every day | ORAL | Status: DC
  Administered 2019-01-22 – 2019-01-23 (×3): 2 mg via ORAL
  Filled 2019-01-21 (×4): qty 1

## 2019-01-21 MED ORDER — JUVEN PO PACK
1.0000 | PACK | Freq: Two times a day (BID) | ORAL | Status: DC
  Administered 2019-01-22 – 2019-01-24 (×5): 1

## 2019-01-21 MED ORDER — LEVETIRACETAM 750 MG PO TABS
750.0000 mg | ORAL_TABLET | Freq: Two times a day (BID) | ORAL | Status: DC
  Administered 2019-01-22 – 2019-01-24 (×6): 750 mg via ORAL
  Filled 2019-01-21 (×9): qty 1

## 2019-01-21 MED ORDER — LACTATED RINGERS IV BOLUS
500.0000 mL | Freq: Once | INTRAVENOUS | Status: AC
  Administered 2019-01-21: 500 mL via INTRAVENOUS

## 2019-01-21 MED ORDER — PRO-STAT SUGAR FREE PO LIQD
30.0000 mL | Freq: Two times a day (BID) | ORAL | Status: DC
  Administered 2019-01-22 – 2019-01-24 (×5): 30 mL

## 2019-01-21 MED ORDER — SODIUM CHLORIDE 0.9 % IV SOLN
2.0000 g | Freq: Once | INTRAVENOUS | Status: AC
  Administered 2019-01-21: 2 g via INTRAVENOUS
  Filled 2019-01-21: qty 2

## 2019-01-21 MED ORDER — ATORVASTATIN CALCIUM 20 MG PO TABS
40.0000 mg | ORAL_TABLET | Freq: Every evening | ORAL | Status: DC
  Administered 2019-01-22 – 2019-01-23 (×2): 40 mg
  Filled 2019-01-21 (×2): qty 2

## 2019-01-21 MED ORDER — POLYETHYLENE GLYCOL 3350 17 G PO PACK: 17.0000 g | PACK | Freq: Every day | ORAL | Status: DC | PRN

## 2019-01-21 MED ORDER — OSMOLITE 1.5 CAL PO LIQD
1000.0000 mL | ORAL | Status: DC
  Administered 2019-01-22: 1000 mL

## 2019-01-21 MED ORDER — FREE WATER
150.0000 mL | Status: DC
  Administered 2019-01-22 (×3): 150 mL

## 2019-01-21 NOTE — ED Notes (Signed)
Report called to melissa rn floor nurse 

## 2019-01-21 NOTE — H&P (Addendum)
[facility] [place] NAME: [name]    MR#:  [id number]  DATE OF BIRTH:  03/26/1956  DATE OF ADMISSION:  01/21/2019  PRIMARY CARE PHYSICIAN: Garwin Brothers, MD   REQUESTING/REFERRING PHYSICIAN: Duffy Bruce, MD  CHIEF COMPLAINT:   Chief Complaint  Patient presents with  . Rectal Bleeding    HISTORY OF PRESENT ILLNESS:  [name]  is a 63 y.o. male with a known history of cerebral hemorrhage s/p left frontal craniotomy, history of stroke, hypertension, prediabetes, hyperlipidemia who was sent to the ED from [facility] due to concern for rectal bleeding.  Patient is nonverbal at baseline, so history is obtained from the chart and from the ED physician.  In the ED, he was meeting sepsis criteria with tachycardia, tachypnea, and leukocytosis.  Labs were significant for WBC 17.4.  UA with large leukocytes, positive nitrites, many bacteria.  Chest x-ray was negative.  Blood and urine cultures were ordered.  Empiric antibiotics were started.  Hospitalists were called for admission.  PAST MEDICAL HISTORY:   Past Medical History:  Diagnosis Date  . Allergy   . Anxiety   . Cataract   . Cerebral hemorrhage ([place])   . Chest pain, atypical    12/2003:  negative cardiolyte  . Depression   . Drug abuse ([place])   . Eczema   . Erectile dysfunction   . History of meniscal tear    bilateral  . HLD (hyperlipidemia)   . HTN (hypertension)   . Hx of tear of ACL (anterior cruciate ligament)    right  . Insomnia   . Olecranon bursitis of left elbow 10/2009   s/p I&D by Dr Maxie Better, initially assessed by Dr. Nori Riis   . Prediabetes   . Stroke ([place])   . Suicidal behavior 04/13/2017   Pt states taking a bunch of sleeping pills to end life   . TIA (transient ischemic attack)     PAST SURGICAL HISTORY:   Past Surgical History:  Procedure Laterality Date  . EP IMPLANTABLE DEVICE N/A 12/30/2015   Procedure: Loop Recorder Insertion;  Surgeon:  Thompson Grayer, MD;  Location: [facility] CV LAB;  Service: Cardiovascular;  Laterality: N/A;  . ESOPHAGOGASTRODUODENOSCOPY N/A 10/03/2016   Procedure: ESOPHAGOGASTRODUODENOSCOPY (EGD);  Surgeon: Irene Shipper, MD;  Location: [facility] ENDOSCOPY;  Service: Endoscopy;  Laterality: N/A;  . INCISE AND DRAIN ABCESS     L elbow due to cellulitis/bursitis  . INNER EAR SURGERY    . IR GENERIC HISTORICAL  12/28/2015   IR ANGIO VERTEBRAL SEL VERTEBRAL UNI L MOD SED 12/28/2015 Luanne Bras, MD [facility]INTER[facility] RAD  . IR GENERIC HISTORICAL  12/28/2015   IR ANGIO VERTEBRAL SEL SUBCLAVIAN INNOMINATE UNI R MOD SED 12/28/2015 Luanne Bras, MD [facility]  . IR GENERIC HISTORICAL  12/28/2015   IR ANGIO INTRA EXTRACRAN SEL INTERNAL CAROTID BILAT MOD SED 12/28/2015 Luanne Bras, MD [facility]  . KNEE ARTHROSCOPY    . PEG PLACEMENT N/A 11/01/2018   Procedure: PERCUTANEOUS ENDOSCOPIC GASTROSTOMY (PEG) PLACEMENT;  Surgeon: Lin Landsman, MD;  Location: [facility] ENDOSCOPY;  Service: Gastroenterology;  Laterality: N/A;  . RADIOLOGY WITH ANESTHESIA N/A 09/20/2015   Procedure: EMBOLIZATION         (RADIOLOGY WITH ANESTHESIA);  Surgeon: Luanne Bras, MD;  Location: [facility];  Service: Radiology;  Laterality: N/A;  . SKIN TAG REMOVAL     11 removed  . TEE WITHOUT CARDIOVERSION N/A 12/30/2015   Procedure: TRANSESOPHAGEAL ECHOCARDIOGRAM (  TEE);  Surgeon: Larey Dresser, MD;  Location: [facility];  Service: Cardiovascular;  Laterality: N/A;    SOCIAL HISTORY:   Social History   Tobacco Use  . Smoking status: Never Smoker  . Smokeless tobacco: Never Used  Substance Use Topics  . Alcohol use: No    Alcohol/week: 0.0 standard drinks    FAMILY HISTORY:   Family History  Problem Relation Age of Onset  . Stroke Mother   . Hypertension Mother   . Aneurysm Mother 37       Died of brain aneursym  . Heart failure Father   . Emphysema Father   . Diabetes Mellitus II Sister   . Colon cancer Neg Hx   . Rectal cancer Neg  Hx   . Stomach cancer Neg Hx     DRUG ALLERGIES:   Allergies  Allergen Reactions  . Metformin Diarrhea    REVIEW OF SYSTEMS:   ROS- unable to obtain due to patient being non-verbal  MEDICATIONS AT HOME:   Prior to Admission medications   Medication Sig Start Date End Date Taking? Authorizing Provider  acetaminophen (TYLENOL) 325 MG tablet Take 650 mg by mouth every 6 (six) hours as needed for mild pain or fever.   Yes [provider]  albuterol (PROVENTIL) (2.5 MG/3ML) 0.083% nebulizer solution Take 2.5 mg by nebulization every 4 (four) hours as needed for wheezing or shortness of breath.   Yes [provider]  Amino Acids-Protein Hydrolys (FEEDING SUPPLEMENT, PRO-STAT SUGAR FREE 64,) LIQD Place 30 mLs into feeding tube 2 (two) times daily. 11/26/18  Yes Henreitta Leber, MD  atorvastatin (LIPITOR) 40 MG tablet Place 40 mg into feeding tube every evening.    Yes [provider]  baclofen (LIORESAL) 10 MG tablet Take 1 tablet (10 mg total) by mouth 3 (three) times daily. 11/26/18  Yes Henreitta Leber, MD  carvedilol (COREG) 25 MG tablet Take 25 mg by mouth 2 (two) times daily with a meal.   Yes [provider]  collagenase (SANTYL) ointment Apply 1 application topically as directed. (apply to lower left leg with every day shift change)   Yes [provider]  doxazosin (CARDURA) 2 MG tablet Take 2 mg by mouth at bedtime.   Yes [provider]  famotidine (PEPCID) 20 MG tablet Take 20 mg by mouth 2 (two) times daily.   Yes [provider]  finasteride (PROSCAR) 5 MG tablet Take 1 tablet (5 mg total) by mouth at bedtime. 11/26/18  Yes Henreitta Leber, MD  levETIRAcetam (KEPPRA) 750 MG tablet Take 750 mg by mouth 2 (two) times daily.   Yes [provider]  methylphenidate (RITALIN) 5 MG tablet Take 5 mg by mouth 2 (two) times daily.   Yes [provider]  mouth rinse LIQD solution 15 mLs by Mouth Rinse route 2  times daily at 12 noon and 4 pm. 11/26/18  Yes Sainani, Belia Heman, MD  nutrition supplement, JUVEN, (JUVEN) PACK Place 1 packet into feeding tube 2 (two) times daily between meals. 11/27/18  Yes Sainani, Belia Heman, MD  Nutritional Supplements (FEEDING SUPPLEMENT, OSMOLITE 1.5 CAL,) LIQD Place 1,000 mLs into feeding tube continuous. 11/26/18  Yes Sainani, Belia Heman, MD  polyethylene glycol (MIRALAX / GLYCOLAX) 17 g packet Take 17 g by mouth daily as needed for mild constipation. 11/26/18  Yes Sainani, Belia Heman, MD  Water For Irrigation, Sterile (FREE WATER) SOLN Place 150 mLs into feeding tube every 4 (four) hours.  11/26/18  Yes Sainani, Belia Heman, MD  modafinil (PROVIGIL) 200 MG tablet Place 1 tablet (200 mg total) into feeding tube 2 (two) times a day. Patient not taking: Reported on 01/21/2019 11/26/18   Henreitta Leber, MD      VITAL SIGNS:  Blood pressure (!) 155/79, pulse (!) 101, temperature 100 F (37.8 C), temperature source Rectal, resp. rate (!) 24, SpO2 100 %.  PHYSICAL EXAMINATION:  Physical Exam  GENERAL:  63 y.o.-year-old patient lying in the bed with no acute distress.  EYES: Pupils equal, round, reactive to light and accommodation. No scleral icterus. Extraocular muscles intact.  HEENT: Head atraumatic, normocephalic. Oropharynx and nasopharynx clear.  NECK:  Supple, no jugular venous distention. No thyroid enlargement, no tenderness.  LUNGS: Normal breath sounds bilaterally, no wheezing, rales,rhonchi or crepitation. No use of accessory muscles of respiration.  CARDIOVASCULAR: tachycardic, regular rhythm, S1, S2 normal. No murmurs, rubs, or gallops.  ABDOMEN: Soft, nontender, nondistended. Bowel sounds present. No organomegaly or mass. +PEG in place. EXTREMITIES: No pedal edema, cyanosis, or clubbing. + Soft boots in place bilaterally. NEUROLOGIC: Cranial nerves II through XII are intact. Muscle strength 5/5 in all extremities. Sensation intact. Gait not checked.  PSYCHIATRIC: The patient  is alert and oriented x 3.  SKIN: +rash present on the right upper back. No obvious vesicles. See picture below.     LABORATORY PANEL:   CBC Recent Labs  Lab 01/21/19 1510  WBC 17.4*  HGB 12.3*  HCT 41.8  PLT 455*   ------------------------------------------------------------------------------------------------------------------  Chemistries  Recent Labs  Lab 01/21/19 1510  NA 146*  K 4.2  CL 108  CO2 29  GLUCOSE 137*  BUN 24*  CREATININE 0.44*  CALCIUM 9.3  AST 19  ALT 33  ALKPHOS 92  BILITOT 0.7   ------------------------------------------------------------------------------------------------------------------  Cardiac Enzymes No results for input(s): TROPONINI in the last 168 hours. ------------------------------------------------------------------------------------------------------------------  RADIOLOGY:  Dg Chest Portable 1 View  Result Date: 01/21/2019 CLINICAL DATA:  Altered mental status. EXAM: PORTABLE CHEST 1 VIEW COMPARISON:  Radiograph October 31, 2018. FINDINGS: Stable cardiomegaly. No pneumothorax or pleural effusion is noted. Lungs are clear. Bony thorax is unremarkable. IMPRESSION: No active disease. Electronically Signed   By: [name] M.D.   On: 01/21/2019 16:02      IMPRESSION AND PLAN:   Sepsis secondary to UTI in the setting of chronic indwelling Foley catheter.  Patient meeting sepsis criteria on admission with tachycardia, tachypnea, and leukocytosis.  UA consistent with infection. CXR negative. -Received vanc/cefepime in the ED, will change to ceftriaxone -Follow-up on blood and urine cultures -Foley catheter was changed in the ED -IVFs  Rectal bleeding- appears due to due to skin breakdown of the anal area. Hemoglobin is actually higher than baseline.  -Will hold off on GI consult at this time -Wound care consult- unable to obtain picture due to patient positioning -Recheck hemoglobin in the morning  Possible shingles-  located on the right side of his back -Start valacyclovir 1000mg  tid x 7 days  Hypertension- BP normal in the ED -Continue home Coreg  History of multiple strokes with PEG tube in place. Patient is non-verbal at baseline. 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 Last A1c 5.9%. -Will hold off on SSI unless blood sugars become markedly elevated  Hyperlipidemia-stable -Continue home  Lipitor  BPH with chronic indwelling foley catheter -Continue home cardura and finasteride  DVT prophylaxis- SCDs  All the records are reviewed and case discussed with ED provider. Management plans discussed with the patient, family and they are in agreement.  CODE STATUS: Full  TOTAL TIME TAKING CARE OF THIS PATIENT: 45 minutes.    Berna Spare [place] M.D on 01/21/2019 at 5:08 PM  Between 7am to 6pm - Pager [phone]  After 6pm go to [email or web address] - Proofreader  [facility]  Office  [phone]  CC: Primary care physician; Garwin Brothers, MD   Note: This dictation was prepared with Dragon dictation along with smaller phrase technology. Any transcriptional errors that result from this process are unintentional.

## 2019-01-21 NOTE — ED Notes (Signed)
Pt brought in via ems from [facility].  Pt sent to er for eval of rectal bleeding.  Pt nonverbal.  Contractures to arms and soft boots on both feet.  Pt has indwelling foley cath with cloudy urine in bag and tube on arrival to er.  Sinus tach on monitor.

## 2019-01-21 NOTE — Progress Notes (Signed)
PHARMACY -  BRIEF ANTIBIOTIC NOTE   Pharmacy has received consult(s) for Vancomycin and Cefepime from an ED provider.  The patient's profile has been reviewed for ht/wt/allergies/indication/available labs.    One time order(s) placed for Vancomycin 2g IV and Cefepime 2g IV.  Further antibiotics/pharmacy consults should be ordered by admitting physician if indicated.                       Thank you, Pearla Dubonnet 01/21/2019  4:22 PM

## 2019-01-21 NOTE — ED Notes (Signed)
Pt also has a gtube in place.  Foley cath removed and new 14g foley cath inserted without diff.  Pt tolerated well  Ua to lab.

## 2019-01-21 NOTE — ED Notes (Signed)
Iv meds infusing 

## 2019-01-21 NOTE — ED Notes (Signed)
ED TO INPATIENT HANDOFF REPORT  ED Nurse Name and Phone #: Chayil Gantt  S Name/Age/Gender [name] 63 y.o. male Room/Bed: ED26A/ED26A  Code Status   Code Status: Prior  Home/SNF/Other Nursing Home Patient oriented to: self Is this baseline? Yes   Triage Complete: Triage complete  Chief Complaint rectal bleeding ems  Triage Note Patient presents to ED via [facility] from [place] for rectal bleeding. Staff at facility report noticing bright red bleeding from rectum. They called their doctor who advised to call EMS for the patient to the brought to ED. Patient is non verbal and unable to follow commands at baseline.    Allergies Allergies  Allergen Reactions  . Metformin Diarrhea    Level of Care/Admitting Diagnosis ED Disposition    ED Disposition Condition [facility]: [place] [[id number]]  Level of Care: Med-Surg [16]  Covid Evaluation: Asymptomatic Screening Protocol (No Symptoms)  [facility]: [id number]  Admitting Physician: Hyman Bible DODD [id number]  Attending Physician: Hyman Bible DODD CD:[id number]  Estimated length of stay: past midnight tomorrow  Certification:: I certify this patient will need inpatient services for at least 2 midnights  PT Class (Do Not Modify): Inpatient [101]  PT Acc Code (Do Not Modify): Private [1]       B Medical/Surgery History Past Medical History:  Diagnosis Date  . Allergy   . Anxiety   . Cataract   . Cerebral hemorrhage ([place])   . Chest pain, atypical    12/2003:  negative cardiolyte  . Depression   . Drug abuse ([place])   . Eczema   . Erectile dysfunction   . History of meniscal tear    bilateral  . HLD (hyperlipidemia)   . HTN (hypertension)   . Hx of tear of ACL (anterior cruciate ligament)    right  . Insomnia   . Olecranon bursitis of left elbow 10/2009   s/p I&D by Dr Maxie Better, initially assessed by Dr. Nori Riis   . Prediabetes   . Stroke ([place])   . Suicidal behavior  04/13/2017   Pt states taking a bunch of sleeping pills to end life   . TIA (transient ischemic attack)    Past Surgical History:  Procedure Laterality Date  . EP IMPLANTABLE DEVICE N/A 12/30/2015   Procedure: Loop Recorder Insertion;  Surgeon: Thompson Grayer, MD;  Location: [facility];  Service: Cardiovascular;  Laterality: N/A;  . ESOPHAGOGASTRODUODENOSCOPY N/A 10/03/2016   Procedure: ESOPHAGOGASTRODUODENOSCOPY (EGD);  Surgeon: Irene Shipper, MD;  Location[facility] ENDOSCOPY;  Service: Endoscopy;  Laterality: N/A;  . INCISE AND DRAIN ABCESS     L elbow due to cellulitis/bursitis  . INNER EAR SURGERY    . IR GENERIC HISTORICAL  12/28/2015   IR ANGIO VERTEBRAL SEL VERTEBRAL UNI L MOD SED 12/28/2015 Luanne Bras, MD [facility]  . IR GENERIC HISTORICAL  12/28/2015   IR ANGIO VERTEBRAL SEL SUBCLAVIAN INNOMINATE UNI R MOD SED 12/28/2015 Luanne Bras, MD [facility]  . IR GENERIC HISTORICAL  12/28/2015   IR ANGIO INTRA EXTRACRAN SEL INTERNAL CAROTID BILAT MOD SED 12/28/2015 Luanne Bras, MD [facility]  . KNEE ARTHROSCOPY    . PEG PLACEMENT N/A 11/01/2018   Procedure: PERCUTANEOUS ENDOSCOPIC GASTROSTOMY (PEG) PLACEMENT;  Surgeon: Lin Landsman, MD;  Location: [facility] ENDOSCOPY;  Service: Gastroenterology;  Laterality: N/A;  . RADIOLOGY WITH ANESTHESIA N/A 09/20/2015   Procedure: EMBOLIZATION         (RADIOLOGY WITH ANESTHESIA);  Surgeon: Luanne Bras, MD;  Location: [place] [facility];  Service: Radiology;  Laterality: N/A;  . SKIN TAG REMOVAL     11 removed  . TEE WITHOUT CARDIOVERSION N/A 12/30/2015   Procedure: TRANSESOPHAGEAL ECHOCARDIOGRAM (TEE);  Surgeon: Larey Dresser, MD;  Location: [facility] ENDOSCOPY;  Service: Cardiovascular;  Laterality: N/A;     A IV Location/Drains/Wounds Patient Lines/Drains/Airways Status   Active Line/Drains/Airways    Name:   Placement date:   Placement time:   Site:   Days:   Peripheral IV 11/27/18 Anterior;Left Forearm   11/27/18    2222    Forearm   55    Peripheral IV 01/21/19 Left Hand   01/21/19    1546    Hand   less than 1   Peripheral IV 01/21/19 Right Forearm   01/21/19    1618    Forearm   less than 1   Gastrostomy/Enterostomy PEG-jejunostomy LLQ   11/01/18    -    LLQ   81   Urethral Catheter Geni Bers RN Straight-tip 16 Fr.   11/24/18    1040    Straight-tip   58   Urethral Catheter Karena Kinker c rn  Latex 14 Fr.   01/21/19    1605    Latex   less than 1          Intake/Output Last 24 hours No intake or output data in the 24 hours ending 01/21/19 1818  Labs/Imaging Results for orders placed or performed during the hospital encounter of 01/21/19 (from the past 48 hour(s))  CBC with Differential     Status: Abnormal   Collection Time: 01/21/19  3:10 PM  Result Value Ref Range   WBC 17.4 (H) 4.0 - 10.5 K/uL   RBC 4.76 4.22 - 5.81 MIL/uL   Hemoglobin 12.3 (L) 13.0 - 17.0 g/dL   HCT 41.8 39.0 - 52.0 %   MCV 87.8 80.0 - 100.0 fL   MCH 25.8 (L) 26.0 - 34.0 pg   MCHC 29.4 (L) 30.0 - 36.0 g/dL   RDW 15.5 11.5 - 15.5 %   Platelets 455 (H) 150 - 400 K/uL   nRBC 0.0 0.0 - 0.2 %   Neutrophils Relative % 76 %   Neutro Abs 13.1 (H) 1.7 - 7.7 K/uL   Lymphocytes Relative 11 %   Lymphs Abs 1.9 0.7 - 4.0 K/uL   Monocytes Relative 8 %   Monocytes Absolute 1.3 (H) 0.1 - 1.0 K/uL   Eosinophils Relative 5 %   Eosinophils Absolute 0.9 (H) 0.0 - 0.5 K/uL   Basophils Relative 0 %   Basophils Absolute 0.1 0.0 - 0.1 K/uL   Immature Granulocytes 0 %   Abs Immature Granulocytes 0.06 0.00 - 0.07 K/uL    Comment: Performed at [facility], [address] [place],  [address]  Comprehensive metabolic panel     Status: Abnormal   Collection Time: 01/21/19  3:10 PM  Result Value Ref Range   Sodium 146 (H) 135 - 145 mmol/L   Potassium 4.2 3.5 - 5.1 mmol/L   Chloride 108 98 - 111 mmol/L   CO2 29 22 - 32 mmol/L   Glucose, Bld 137 (H) 70 - 99 mg/dL   BUN 24 (H) 8 - 23 mg/dL   Creatinine, Ser 0.44 (L) 0.61 - 1.24 mg/dL   Calcium 9.3 8.9  - 10.3 mg/dL   Total Protein 7.7 6.5 - 8.1 g/dL   Albumin 3.2 (L) 3.5 - 5.0 g/dL  AST 19 15 - 41 U/L   ALT 33 0 - 44 U/L   Alkaline Phosphatase 92 38 - 126 U/L   Total Bilirubin 0.7 0.3 - 1.2 mg/dL   GFR calc non Af Amer >60 >60 mL/min   GFR calc Af Amer >60 >60 mL/min   Anion gap 9 5 - 15    Comment: Performed at [facility], [address] [place], [place] [address]  Lactic acid, plasma     Status: None   Collection Time: 01/21/19  3:10 PM  Result Value Ref Range   Lactic Acid, Venous 1.2 0.5 - 1.9 mmol/L    Comment: Performed at [facility], [address] [place], [place] [address]  Type and screen [place]     Status: None   Collection Time: 01/21/19  3:10 PM  Result Value Ref Range   ABO/RH(D) A NEG    Antibody Screen NEG    Sample Expiration      01/24/2019,2359 Performed at [facility], [address] [place], [place] [address]   Urinalysis, Complete w Microscopic     Status: Abnormal   Collection Time: 01/21/19  3:10 PM  Result Value Ref Range   Color, Urine AMBER (A) YELLOW    Comment: BIOCHEMICALS MAY BE AFFECTED BY COLOR   APPearance CLOUDY (A) CLEAR   Specific Gravity, Urine 1.019 1.005 - 1.030   pH 8.0 5.0 - 8.0   Glucose, UA NEGATIVE NEGATIVE mg/dL   Hgb urine dipstick NEGATIVE NEGATIVE   Bilirubin Urine NEGATIVE NEGATIVE   Ketones, ur NEGATIVE NEGATIVE mg/dL   Protein, ur 100 (A) NEGATIVE mg/dL   Nitrite POSITIVE (A) NEGATIVE   Leukocytes,Ua LARGE (A) NEGATIVE   RBC / HPF 11-20 0 - 5 RBC/hpf   WBC, UA 21-50 0 - 5 WBC/hpf   Bacteria, UA MANY (A) NONE SEEN   Squamous Epithelial / LPF NONE SEEN 0 - 5   Mucus PRESENT     Comment: Performed at [facility], [address] [place], [place] [address]  Protime-INR     Status: None   Collection Time: 01/21/19  4:15 PM  Result Value Ref Range   Prothrombin Time 14.1 11.4 - 15.2 seconds   INR 1.1 0.8 - 1.2    Comment: (NOTE) INR goal  varies based on device and disease states. Performed at [facility], [address], [place], [place] [address]    Dg Chest Portable 1 View  Result Date: 01/21/2019 CLINICAL DATA:  Altered mental status. EXAM: PORTABLE CHEST 1 VIEW COMPARISON:  Radiograph October 31, 2018. FINDINGS: Stable cardiomegaly. No pneumothorax or pleural effusion is noted. Lungs are clear. Bony thorax is unremarkable. IMPRESSION: No active disease. Electronically Signed   By: [name] M.D.   On: 01/21/2019 16:02    Pending Labs Unresulted Labs (From admission, onward)    Start     Ordered   01/21/19 1704  Urine culture  ONCE - STAT,   STAT     01/21/19 1703   01/21/19 1617  SARS CORONAVIRUS 2 (TAT 6-12 HRS) Nasal Swab Aptima Multi Swab  (Asymptomatic/Tier 2 Patients Labs)  Once,   STAT    Question Answer Comment  Is this test for diagnosis or screening Screening   Symptomatic for COVID-19 as defined by CDC No   Hospitalized for COVID-19 No   Admitted to ICU for COVID-19 No   Previously tested for COVID-19 Yes   Resident in a  congregate (group) care setting Yes   Employed in healthcare setting No      01/21/19 1616   01/21/19 1548  Blood culture (routine x 2)  BLOOD CULTURE X 2,   STAT     01/21/19 1547   01/21/19 1516  Lactic acid, plasma  Now then every 2 hours,   STAT     01/21/19 1515   Signed and Held  Basic metabolic panel  Tomorrow morning,   R     Signed and Held   Signed and Held  CBC  Tomorrow morning,   R     Signed and Held          Vitals/Pain Today's Vitals   01/21/19 1600 01/21/19 1630 01/21/19 1700 01/21/19 1745  BP: (!) 155/79  (!) 143/68   Pulse:  (!) 101 99 99  Resp: (!) 25 (!) 24 (!) 24 (!) 26  Temp:      TempSrc:      SpO2:  100% 100% 100%    Isolation Precautions No active isolations  Medications Medications  vancomycin (VANCOCIN) 2,000 mg in sodium chloride 0.9 % 500 mL IVPB (2,000 mg Intravenous New Bag/Given 01/21/19 1630)  lactated ringers bolus  500 mL (0 mLs Intravenous Stopped 01/21/19 1719)  ceFEPIme (MAXIPIME) 2 g in sodium chloride 0.9 % 100 mL IVPB (0 g Intravenous Stopped 01/21/19 1656)    Mobility non-ambulatory High fall risk   Focused Assessments Cardiac Assessment Handoff:    Lab Results  Component Value Date   TROPONINI <0.03 10/19/2018   No results found for: DDIMER Does the Patient currently have chest pain? No     R Recommendations: See Admitting Provider Note  Report given to:   Additional Notes: none

## 2019-01-21 NOTE — ED Provider Notes (Addendum)
[facility] Emergency Department Provider Note  ____________________________________________   First MD Initiated Contact with Patient 01/21/19 1503     (approximate)  I have reviewed the triage vital signs and the nursing notes.   HISTORY  Chief Complaint Rectal Bleeding    HPI [name] is a 63 y.o. male  With h/o CVA, dementia, non-verbal, extensive h/o below here with rectal bleeding. History is limited 2/2 noverbal status, no collateral available. Per report, pt began having bright red rectal bleeding today. No reported h/o rectal bleeding. Not on blood thinners 2/2 history of cerebral hemorrhage. Pt unable to provide any history.  Level 5 caveat invoked as remainder of history, ROS, and physical exam limited due to patient's dementia      Past Medical History:  Diagnosis Date   Allergy    Anxiety    Cataract    Cerebral hemorrhage ([place])    Chest pain, atypical    12/2003:  negative cardiolyte   Depression    Drug abuse ([place])    Eczema    Erectile dysfunction    History of meniscal tear    bilateral   HLD (hyperlipidemia)    HTN (hypertension)    Hx of tear of ACL (anterior cruciate ligament)    right   Insomnia    Olecranon bursitis of left elbow 10/2009   s/p I&D by Dr Maxie Better, initially assessed by Dr. Nori Riis    Prediabetes    Stroke [facility])    Suicidal behavior 04/13/2017   Pt states taking a bunch of sleeping pills to end life    TIA (transient ischemic attack)     Patient Active Problem List   Diagnosis Date Noted   Palliative care encounter 11/29/2018   Pressure injury of skin 10/23/2018   Sepsis ([place]) 10/19/2018   Severe major depression ([place]) 04/14/2017   Major depressive disorder, recurrent ([place]) 04/09/2017   MDD (major depressive disorder), recurrent severe, without psychosis ([place]) 04/09/2017   Family history of brain aneurysm    Intractable vomiting 10/20/2016   History of intracranial  aneurysm    Hypokalemia    AKI (acute kidney injury) ([place])    Prolonged QT interval    Lactic acidosis    Dysphagia    Esophageal ring    Gastroesophageal reflux disease with esophagitis    Nausea and vomiting 10/01/2016   Pseudobulbar affect 07/20/2016   Severe episode of recurrent major depressive disorder, without psychotic features ([place]) 07/20/2016   CVA (cerebral vascular accident) ([place]) 06/15/2016   Abdominal pain    Numbness    Carpal tunnel syndrome, bilateral 12/16/2015   Status post stroke 11/12/2015   Transient neurologic deficit 10/15/2015   Cerebrovascular accident, late effects [id number]   Complicated migraine    Anxiety state    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   Cerebral infarction due to embolism of right middle cerebral artery ([place]) 09/23/2015   Cerebral infarction due to embolism of cerebral artery ([place]) 09/23/2015   Gait disturbance, post-stroke    Drooping of mouth    Cerebrovascular accident (CVA) due to embolism of right anterior cerebral artery ([place])    Benign essential HTN    Migraine with aura and without status migrainosus, not intractable    Tachypnea    Prediabetes    Acute blood loss anemia    Brain aneurysm 09/20/2015   Type 2 diabetes mellitus ([place]) 09/15/2015   Leukocytosis 09/15/2015   Aneurysm, cerebral, nonruptured 09/15/2015   TIA (transient ischemic attack) 09/14/2015  Temporary cerebral vascular dysfunction 09/14/2015   H/O transient cerebral ischemia 07/29/2015   At risk for falling 07/29/2015   Bilateral hearing loss 10/25/2014   Abnormal fear 08/08/2012   Headache, migraine 08/08/2012   Arthritis, degenerative 08/08/2012   History of tear of ACL (anterior cruciate ligament) 03/08/2011   History of knee problem 03/08/2011   Depression 09/07/2010   Depressive disorder, not elsewhere classified 09/07/2010   URI (upper respiratory infection) 08/24/2010   Hearing loss 09/06/2006     Hyperlipidemia 04/23/2006   Essential hypertension 04/23/2006   Psychophysiological insomnia 04/23/2006    Past Surgical History:  Procedure Laterality Date   EP IMPLANTABLE DEVICE N/A 12/30/2015   Procedure: Loop Recorder Insertion;  Surgeon: Thompson Grayer, MD;  Location: [place] [facility];  Service: Cardiovascular;  Laterality: N/A;   ESOPHAGOGASTRODUODENOSCOPY N/A 10/03/2016   Procedure: ESOPHAGOGASTRODUODENOSCOPY (EGD);  Surgeon: Irene Shipper, MD;  Location: [facility] ENDOSCOPY;  Service: Endoscopy;  Laterality: N/A;   INCISE AND DRAIN ABCESS     L elbow due to cellulitis/bursitis   INNER EAR SURGERY     IR GENERIC HISTORICAL  12/28/2015   IR ANGIO VERTEBRAL SEL VERTEBRAL UNI L MOD SED 12/28/2015 Luanne Bras, MD [facility]   IR GENERIC HISTORICAL  12/28/2015   IR ANGIO VERTEBRAL SEL SUBCLAVIAN INNOMINATE UNI R MOD SED 12/28/2015 Luanne Bras, MD [facility]   IR GENERIC HISTORICAL  12/28/2015   IR ANGIO INTRA EXTRACRAN SEL INTERNAL CAROTID BILAT MOD SED 12/28/2015 Luanne Bras, MD [facility]   KNEE ARTHROSCOPY     PEG PLACEMENT N/A 11/01/2018   Procedure: PERCUTANEOUS ENDOSCOPIC GASTROSTOMY (PEG) PLACEMENT;  Surgeon: Lin Landsman, MD;  Location: [facility] ENDOSCOPY;  Service: Gastroenterology;  Laterality: N/A;   RADIOLOGY WITH ANESTHESIA N/A 09/20/2015   Procedure: EMBOLIZATION         (RADIOLOGY WITH ANESTHESIA);  Surgeon: Luanne Bras, MD;  Location: [place];  Service: Radiology;  Laterality: N/A;   SKIN TAG REMOVAL     11 removed   TEE WITHOUT CARDIOVERSION N/A 12/30/2015   Procedure: TRANSESOPHAGEAL ECHOCARDIOGRAM (TEE);  Surgeon: Larey Dresser, MD;  Location: [place];  Service: Cardiovascular;  Laterality: N/A;    Prior to Admission medications   Medication Sig Start Date End Date Taking? Authorizing Provider  acetaminophen (TYLENOL) 325 MG tablet Take 650 mg by mouth every 6 (six) hours as needed for mild pain or fever.   Yes [provider]  albuterol (PROVENTIL) (2.5 MG/3ML) 0.083% nebulizer solution Take 2.5 mg by nebulization every 4 (four) hours as needed for wheezing or shortness of breath.   Yes [provider]  Amino Acids-Protein Hydrolys (FEEDING SUPPLEMENT, PRO-STAT SUGAR FREE 64,) LIQD Place 30 mLs into feeding tube 2 (two) times daily. 11/26/18  Yes Henreitta Leber, MD  atorvastatin (LIPITOR) 40 MG tablet Place 40 mg into feeding tube every evening.    Yes [provider]  baclofen (LIORESAL) 10 MG tablet Take 1 tablet (10 mg total) by mouth 3 (three) times daily. 11/26/18  Yes Henreitta Leber, MD  carvedilol (COREG) 25 MG tablet Take 25 mg by mouth 2 (two) times daily with a meal.   Yes [provider]  collagenase (SANTYL) ointment Apply 1 application topically as directed. (apply to lower left leg with every day shift change)   Yes [provider]  doxazosin (CARDURA) 2 MG tablet Take 2 mg by mouth at bedtime.   Yes [provider]  famotidine (PEPCID) 20 MG tablet  Take 20 mg by mouth 2 (two) times daily.   Yes [provider]  finasteride (PROSCAR) 5 MG tablet Take 1 tablet (5 mg total) by mouth at bedtime. 11/26/18  Yes Henreitta Leber, MD  levETIRAcetam (KEPPRA) 750 MG tablet Take 750 mg by mouth 2 (two) times daily.   Yes [provider]  methylphenidate (RITALIN) 5 MG tablet Take 5 mg by mouth 2 (two) times daily.   Yes [provider]  mouth rinse LIQD solution 15 mLs by Mouth Rinse route 2 times daily at 12 noon and 4 pm. 11/26/18  Yes Sainani, Belia Heman, MD  nutrition supplement, JUVEN, (JUVEN) PACK Place 1 packet into feeding tube 2 (two) times daily between meals. 11/27/18  Yes Sainani, Belia Heman, MD  Nutritional Supplements (FEEDING SUPPLEMENT, OSMOLITE 1.5 CAL,) LIQD Place 1,000 mLs into feeding tube continuous. 11/26/18  Yes Sainani, Belia Heman, MD  polyethylene glycol (MIRALAX / GLYCOLAX) 17 g packet Take 17 g by mouth daily as needed  for mild constipation. 11/26/18  Yes Sainani, Belia Heman, MD  Water For Irrigation, Sterile (FREE WATER) SOLN Place 150 mLs into feeding tube every 4 (four) hours. 11/26/18  Yes Sainani, Belia Heman, MD  modafinil (PROVIGIL) 200 MG tablet Place 1 tablet (200 mg total) into feeding tube 2 (two) times a day. Patient not taking: Reported on 01/21/2019 11/26/18   Henreitta Leber, MD    Allergies Metformin  Family History  Problem Relation Age of Onset   Stroke Mother    Hypertension Mother    Aneurysm Mother 81       Died of brain aneursym   Heart failure Father    Emphysema Father    Diabetes Mellitus II Sister    Colon cancer Neg Hx    Rectal cancer Neg Hx    Stomach cancer Neg Hx     Social History Social History   Tobacco Use   Smoking status: Never Smoker   Smokeless tobacco: Never Used  Substance Use Topics   Alcohol use: No    Alcohol/week: 0.0 standard drinks   Drug use: No    Review of Systems  Review of Systems  Unable to perform ROS: Dementia     ____________________________________________  PHYSICAL EXAM:      VITAL SIGNS: ED Triage Vitals [01/21/19 1505]  Enc Vitals Group     BP 140/90     Pulse Rate 99     Resp (!) 21     Temp      Temp src      SpO2 100 %     Weight      Height      Head Circumference      Peak Flow      Pain Score      Pain Loc      Pain Edu?      Excl. in [place]?      Physical Exam Vitals signs and nursing note reviewed.  Constitutional:      General: He is not in acute distress.    Appearance: He is well-developed.     Comments: Elderly, chronically ill-appearing  HENT:     Head: Normocephalic and atraumatic.     Mouth/Throat:     Mouth: Mucous membranes are dry.  Eyes:     Conjunctiva/sclera: Conjunctivae normal.  Neck:     Musculoskeletal: Neck supple.  Cardiovascular:     Rate and Rhythm: Regular rhythm. Tachycardia present.     Heart sounds:  Normal heart sounds.  Pulmonary:     Effort: Pulmonary effort  is normal. No respiratory distress.     Breath sounds: No wheezing.  Abdominal:     General: Abdomen is flat. There is no distension.  Genitourinary:    Comments: Significant maceration to skin along perirectal area, no obvious bleeding hemorrhoids noted. Stool grossly positive for blood. Stool is soft, brown, with streaks of blood though this is likely from macerated skin in perineal and perianal area. Skin:    General: Skin is warm.     Capillary Refill: Capillary refill takes less than 2 seconds.     Findings: No rash.     Comments: Superficial excoriation and pressure wounds noted to perianal and perirectal area.  Neurological:     Mental Status: He is alert.     Motor: No abnormal muscle tone.     Comments: Non-verbal, does not follow commands.        ____________________________________________   LABS (all labs ordered are listed, but only abnormal results are displayed)  Labs Reviewed  CBC WITH DIFFERENTIAL/PLATELET - Abnormal; Notable for the following components:      Result Value   WBC 17.4 (*)    Hemoglobin 12.3 (*)    MCH 25.8 (*)    MCHC 29.4 (*)    Platelets 455 (*)    Neutro Abs 13.1 (*)    Monocytes Absolute 1.3 (*)    Eosinophils Absolute 0.9 (*)    All other components within normal limits  COMPREHENSIVE METABOLIC PANEL - Abnormal; Notable for the following components:   Sodium 146 (*)    Glucose, Bld 137 (*)    BUN 24 (*)    Creatinine, Ser 0.44 (*)    Albumin 3.2 (*)    All other components within normal limits  URINALYSIS, COMPLETE (UACMP) WITH MICROSCOPIC - Abnormal; Notable for the following components:   Color, Urine AMBER (*)    APPearance CLOUDY (*)    Protein, ur 100 (*)    Nitrite POSITIVE (*)    Leukocytes,Ua LARGE (*)    Bacteria, UA MANY (*)    All other components within normal limits  CULTURE, BLOOD (ROUTINE X 2)  CULTURE, BLOOD (ROUTINE X 2)  SARS CORONAVIRUS 2 (TAT 6-12 HRS)  URINE CULTURE  LACTIC ACID, PLASMA  PROTIME-INR   LACTIC ACID, PLASMA  TYPE AND SCREEN    ____________________________________________  EKG: Sinus tachycardia, VR 105. QRS 102, QTc 463. No acute ST-t segment changes.  ________________________________________  RADIOLOGY All imaging, including plain films, CT scans, and ultrasounds, independently reviewed by me, and interpretations confirmed via formal radiology reads.  ED MD interpretation:   CXR: Normal  Official radiology report(s): Dg Chest Portable 1 View  Result Date: 01/21/2019 CLINICAL DATA:  Altered mental status. EXAM: PORTABLE CHEST 1 VIEW COMPARISON:  Radiograph October 31, 2018. FINDINGS: Stable cardiomegaly. No pneumothorax or pleural effusion is noted. Lungs are clear. Bony thorax is unremarkable. IMPRESSION: No active disease. Electronically Signed   By: [name] M.D.   On: 01/21/2019 16:02    ____________________________________________  PROCEDURES   Procedure(s) performed (including Critical Care):  Ultrasound ED Peripheral IV (Provider)  Date/Time: 01/21/2019 4:09 PM Performed by: Duffy Bruce, MD Authorized by: Duffy Bruce, MD   Procedure details:    Indications: multiple failed IV attempts and poor IV access     Skin Prep: chlorhexidine gluconate     Location:  Right forearm   Angiocath:  20 G   Bedside Ultrasound  Guided: Yes     Images: archived     Patient tolerated procedure without complications: Yes     Dressing applied: Yes    .Critical Care Performed by: Duffy Bruce, MD Authorized by: Duffy Bruce, MD   Critical care provider statement:    Critical care time (minutes):  35   Critical care time was exclusive of:  Separately billable procedures and treating other patients and teaching time   Critical care was necessary to treat or prevent imminent or life-threatening deterioration of the following conditions:  Cardiac failure, sepsis and circulatory failure   Critical care was time spent personally by me on the following  activities:  Development of treatment plan with patient or surrogate, discussions with consultants, evaluation of patient's response to treatment, examination of patient, obtaining history from patient or surrogate, ordering and performing treatments and interventions, ordering and review of laboratory studies, ordering and review of radiographic studies, pulse oximetry, re-evaluation of patient's condition and review of old charts   I assumed direction of critical care for this patient from another provider in my specialty: no      ____________________________________________  INITIAL IMPRESSION / MDM / [place] / ED COURSE  As part of my medical decision making, I reviewed the following data within the electronic MEDICAL RECORD NUMBER Notes from prior ED visits and [place] Controlled Substance Database      *[name] was evaluated in Emergency Department on 01/21/2019 for the symptoms described in the history of present illness. He was evaluated in the context of the global COVID-19 pandemic, which necessitated consideration that the patient might be at risk for infection with the SARS-CoV-2 virus that causes COVID-19. Institutional protocols and algorithms that pertain to the evaluation of patients at risk for COVID-19 are in a state of rapid change based on information released by regulatory bodies including the CDC and federal and state organizations. These policies and algorithms were followed during the patient's care in the ED.  Some ED evaluations and interventions may be delayed as a result of limited staffing during the pandemic.*   Clinical Course as of Jan 21 1735  Tue Jan 21, 2019  1546 63 yo M here with rectal bleeding. History limited 2/2 non verbal status, no collateral available at this time. Pt with T100F, RR 20s, and HR 95-105. WBC 17.4 Concern for possible sepsis, likely sources include UTI, PNA. His abdomen is soft, and Hgb is actually above baseline - doubt significant  GIB. He has significant maceration of the skin in his perirectal area which I suspect is contributing to superficial bleeding, as stool itself appears brown but w/ blood streaks. DDx includes internal hemorrhoids. May also be a component of cellulitis form his skin breakdown.   [CI]  1558 Foley catheter replaced. PIV to be done by myself 2/2 difficult access.   [CI]  [id number] BUN:Cr markedly elevated and pt dehydrated clinically. Gentle fluids given (h/o HFpEF). Pt appears hemoconcentrated on CBC.   [CI]  1617 Given tachycardia, low-grade temp, leukocytosis, tachypnea, pt meets sepsis criteria. UOP improved after foley replacement. Gentle fluids, IV ABX. LA pending.   [CI]  1700 UA c/w UTI. Admit.   [CI]    Clinical Course User Index [CI] Duffy Bruce, MD    Medical Decision Making: As above.  ____________________________________________  FINAL CLINICAL IMPRESSION(S) / ED DIAGNOSES  Final diagnoses:  Sepsis without acute organ dysfunction, due to unspecified organism ([place])  Cellulitis of buttock  Pressure injury of skin of left  upper back, unspecified injury stage     MEDICATIONS GIVEN DURING THIS VISIT:  Medications  vancomycin (VANCOCIN) 2,000 mg in sodium chloride 0.9 % 500 mL IVPB (2,000 mg Intravenous New Bag/Given 01/21/19 1630)  lactated ringers bolus 500 mL (0 mLs Intravenous Stopped 01/21/19 1719)  ceFEPIme (MAXIPIME) 2 g in sodium chloride 0.9 % 100 mL IVPB (0 g Intravenous Stopped 01/21/19 1656)     ED Discharge Orders    None       Note:  This document was prepared using Dragon voice recognition software and may include unintentional dictation errors.   Duffy Bruce, MD 01/21/19 1701    Duffy Bruce, MD 01/21/19 Jennette Bill    Duffy Bruce, MD 02/17/19 1128

## 2019-01-21 NOTE — ED Notes (Signed)
Dr Lavera Guise attempting u/s iv.

## 2019-01-21 NOTE — ED Triage Notes (Signed)
Patient presents to ED via [facility] from [facility] for rectal bleeding. Staff at facility report noticing bright red bleeding from rectum. They called their doctor who advised to call EMS for the patient to the brought to ED. Patient is non verbal and unable to follow commands at baseline.

## 2019-01-22 LAB — BASIC METABOLIC PANEL
Anion gap: 8 (ref 5–15)
BUN: 18 mg/dL (ref 8–23)
CO2: 28 mmol/L (ref 22–32)
Calcium: 8.8 mg/dL — ABNORMAL LOW (ref 8.9–10.3)
Chloride: 112 mmol/L — ABNORMAL HIGH (ref 98–111)
Creatinine, Ser: 0.37 mg/dL — ABNORMAL LOW (ref 0.61–1.24)
GFR calc Af Amer: >60 mL/min (ref >60)
GFR calc non Af Amer: >60 mL/min (ref >60)
Glucose, Bld: 122 mg/dL — ABNORMAL HIGH (ref 70–99)
Potassium: 3.5 mmol/L (ref 3.5–5.1)
Sodium: 148 mmol/L — ABNORMAL HIGH (ref 135–145)

## 2019-01-22 LAB — CBC
HCT: 34.3 % — ABNORMAL LOW (ref 39.0–52.0)
Hemoglobin: 10.1 g/dL — ABNORMAL LOW (ref 13.0–17.0)
MCH: 26.1 pg (ref 26.0–34.0)
MCHC: 29.4 g/dL — ABNORMAL LOW (ref 30.0–36.0)
MCV: 88.6 fL (ref 80.0–100.0)
Platelets: 387 10*3/uL (ref 150–400)
RBC: 3.87 MIL/uL — ABNORMAL LOW (ref 4.22–5.81)
RDW: 15.9 % — ABNORMAL HIGH (ref 11.5–15.5)
WBC: 11.3 10*3/uL — ABNORMAL HIGH (ref 4.0–10.5)
nRBC: 0 % (ref 0.0–0.2)

## 2019-01-22 LAB — SARS CORONAVIRUS 2 (TAT 6-24 HRS): SARS Coronavirus 2: NEGATIVE

## 2019-01-22 LAB — MRSA PCR SCREENING: MRSA by PCR: POSITIVE — AB

## 2019-01-22 MED ORDER — MUPIROCIN 2 % EX OINT
1.0000 "application " | TOPICAL_OINTMENT | Freq: Two times a day (BID) | CUTANEOUS | Status: DC
  Administered 2019-01-22 – 2019-01-24 (×5): 1 via NASAL
  Filled 2019-01-22: qty 22

## 2019-01-22 MED ORDER — OSMOLITE 1.5 CAL PO LIQD
1000.0000 mL | ORAL | Status: DC
  Administered 2019-01-22 – 2019-01-23 (×3): 1000 mL

## 2019-01-22 MED ORDER — COLLAGENASE 250 UNIT/GM EX OINT
TOPICAL_OINTMENT | Freq: Every day | CUTANEOUS | Status: DC
  Administered 2019-01-22 – 2019-01-24 (×3): via TOPICAL
  Filled 2019-01-22: qty 30

## 2019-01-22 MED ORDER — OSMOLITE 1.5 CAL PO LIQD
1000.0000 mL | ORAL | Status: DC
  Administered 2019-01-22: 1000 mL

## 2019-01-22 MED ORDER — SODIUM CHLORIDE 0.45 % IV SOLN
INTRAVENOUS | Status: DC
  Administered 2019-01-22: 22:00:00 via INTRAVENOUS

## 2019-01-22 MED ORDER — CHLORHEXIDINE GLUCONATE CLOTH 2 % EX PADS
6.0000 | MEDICATED_PAD | Freq: Every day | CUTANEOUS | Status: DC
  Administered 2019-01-22: 13:00:00 6 via TOPICAL

## 2019-01-22 MED ORDER — FREE WATER
200.0000 mL | Status: DC
  Administered 2019-01-22 – 2019-01-24 (×8): 200 mL

## 2019-01-22 MED ORDER — SODIUM CHLORIDE 0.45 % IV SOLN
INTRAVENOUS | Status: DC
  Administered 2019-01-22: 13:00:00 via INTRAVENOUS

## 2019-01-22 NOTE — Progress Notes (Signed)
Patient non-verbal but nods head when asked yes or no questions. Per daughter, pt is deaf in one ear and has a cochlear hearing aid, and hears minimal sounds in the other ear. She is unsure of which ear is which. Patient does not have a hearing aid in place. I am unsure if patient truly understood what I was asking.   Redressed all patient wounds.  Coccyx, LLLeg,   Cleaned and redressed shingles site on right upper back.   Patient has a pus like discharge coming from penis head. Cleaned multiple times today.

## 2019-01-22 NOTE — TOC Initial Note (Signed)
Transition of Care [facility]) - Initial/Assessment Note    Patient Details  Name: [name] MRN: [id number] Date of Birth: [birth year]  [facility] CM/SW Contact:    Beverly Sessions, RN Phone Number: 01/22/2019, 5:11 PM  Clinical Narrative:            Patient admitted from [facility].  RNCM spoke with Claiborne Billings at [facility] center.  Patient is a long term care resident  Reported patient is non verbal. RNCM spoke with patient's daughter [name] via phone.  Per [name] her and her sister reside in [place].  She confirms patient is a long term care resident at [facility].  At that time patient begins crying and states "please send him somewhere else".  She explains that patient receiving the care he needs at the facility.  She states "they are telling me that his hair is matted, he has shingles, and has wounds that have not been taking care of".  She states that she previously expressed concerns of patient returning to [facility] however there were not other options.   [name] states that her end goal is for the patient to come and live with her, but that is not an option at the time of discharge.   RNCM informed daughter that we could send out a bedsearch for LTC beds, however we may have limited options.  Daughter states you can send it to other counties, she states "[place], [place], [place], [place], and [place]"  Floyd[place] Flock was initiated in the hub and sent to surrounding counties.      [name] requests copy of medical records.  She states that she is the health care power of attorney.  I have provided her with the main hospital number, and notified her that she would have to complete paper work through Medical Records in order to obtain them. She is in agreement.    Daughter is very tearful throughout the conversation.  RNCM inquires if she has a support person for her during this time.  Daughter states that she has a supportive husband.   RNCM  requested that daughter speak to her sister to determine what their plan is if no other bed offers are available at discharge.  She is in agreement  Patient has existing PASRR Fl2 sent for signature .        Expected Discharge Plan: [place]     Patient Goals and CMS Choice        Expected Discharge Plan and Services Expected Discharge Plan: [place]   Discharge Planning Services: CM Consult   Living arrangements for the past 2 months: [place]                                      Prior Living Arrangements/Services Living arrangements for the past 2 months: [place] Lives with:: Facility Resident                   Activities of Daily Living      Permission Sought/Granted                  Emotional Assessment              Admission diagnosis:  Cellulitis of buttock [L03.317] Pressure injury of skin of left upper back, unspecified injury stage [L89.129] Sepsis without acute organ dysfunction, due  to unspecified organism [facility]) [A41.9] Patient Active Problem List   Diagnosis Date Noted  . Palliative care encounter 11/29/2018  . Pressure injury of skin 10/23/2018  . Sepsis ([place]) 10/19/2018  . Severe major depression ([place]) 04/14/2017  . Major depressive disorder, recurrent ([place]) 04/09/2017  . MDD (major depressive disorder), recurrent severe, without psychosis ([place]) 04/09/2017  . Family history of brain aneurysm   . Intractable vomiting 10/20/2016  . History of intracranial aneurysm   . Hypokalemia   . AKI (acute kidney injury) ([place])   . Prolonged QT interval   . Lactic acidosis   . Dysphagia   . Esophageal ring   . Gastroesophageal reflux disease with esophagitis   . Nausea and vomiting 10/01/2016  . 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 Severe episode of recurrent major depressive disorder, without psychotic features ([place]) 07/20/2016  . CVA (cerebral vascular accident) ([place])  06/15/2016  . Abdominal pain   . Numbness   . Carpal tunnel syndrome, bilateral 12/16/2015  . Status post stroke 11/12/2015  . Transient neurologic deficit 10/15/2015  . Cerebrovascular accident, late effects 10/07/2015  . Complicated migraine   . Anxiety state   . 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 Cerebral infarction due to embolism of right middle cerebral artery ([place]) 09/23/2015  . Cerebral infarction due to embolism of cerebral artery ([place]) 09/23/2015  . Gait disturbance, post-stroke   . Drooping of mouth   . Cerebrovascular accident (CVA) due to embolism of right anterior cerebral artery ([place])   . Benign essential HTN   . Migraine with aura and without status migrainosus, not intractable   . Tachypnea   . Prediabetes   . Acute blood loss anemia   . Brain aneurysm 09/20/2015  . Type 2 diabetes mellitus ([place]) 09/15/2015  . Leukocytosis 09/15/2015  . Aneurysm, cerebral, nonruptured 09/15/2015  . TIA (transient ischemic attack) 09/14/2015  . Temporary cerebral vascular dysfunction 09/14/2015  . H/O transient cerebral ischemia 07/29/2015  . At risk for falling 07/29/2015  . Bilateral hearing loss 10/25/2014  . Abnormal fear 08/08/2012  . Headache, migraine 08/08/2012  . Arthritis, degenerative 08/08/2012  . History of tear of ACL (anterior cruciate ligament) 03/08/2011  . History of knee problem 03/08/2011  . Depression 09/07/2010  . Depressive disorder, not elsewhere classified 09/07/2010  . URI (upper respiratory infection) 08/24/2010  . Hearing loss 09/06/2006  . Hyperlipidemia 04/23/2006  . Essential hypertension 04/23/2006  . Psychophysiological insomnia 04/23/2006   PCP:  Garwin Brothers, MD Pharmacy:   [facility] [address] [place] - [address] [place] [address] Phone: [phone] Fax: [phone]     Social Determinants of Health (SDOH) Interventions    Readmission Risk Interventions Readmission  Risk Prevention Plan 11/08/2018  Post Dischage Appt Complete  Medication Screening Complete  Transportation Screening Complete  Some recent data might be hidden

## 2019-01-22 NOTE — Progress Notes (Signed)
[place] [facility] [place] NAME: [name]    MR#:  [id number]  DATE OF BIRTH:  January 24, 1956  SUBJECTIVE:  CHIEF COMPLAINT:   Chief Complaint  Patient presents with  . Rectal Bleeding   No bleeding Non verbal Afebrile Tolerating tube feeds  REVIEW OF SYSTEMS:    Review of Systems  Unable to perform ROS: Dementia    DRUG ALLERGIES:   Allergies  Allergen Reactions  . Metformin Diarrhea    VITALS:  Blood pressure 121/64, pulse 93, temperature 99.3 F (37.4 C), temperature source Oral, resp. rate 20, SpO2 99 %.  PHYSICAL EXAMINATION:   Physical Exam  GENERAL:  63 y.o.-year-old patient lying in the bed with no acute distress.  EYES: Pupils equal, round, reactive to light and accommodation. No scleral icterus. Extraocular muscles intact.  HEENT: Head atraumatic, normocephalic. Oropharynx and nasopharynx clear.  NECK:  Supple, no jugular venous distention. No thyroid enlargement, no tenderness.  LUNGS: Normal breath sounds bilaterally, no wheezing, rales, rhonchi. No use of accessory muscles of respiration.  CARDIOVASCULAR: S1, S2 normal. No murmurs, rubs, or gallops.  ABDOMEN: Soft, nontender, nondistended. Bowel sounds present. No organomegaly or mass.  PEG tube Foley catheter EXTREMITIES: No cyanosis, clubbing or edema b/l.    NEUROLOGIC: Not following instructions PSYCHIATRIC: The patient is awake but non verbal SKIN: Multiple decubitus ulcers            LABORATORY PANEL:   CBC Recent Labs  Lab 01/22/19 0506  WBC 11.3*  HGB 10.1*  HCT 34.3*  PLT 387   ------------------------------------------------------------------------------------------------------------------ Chemistries  Recent Labs  Lab 01/21/19 1510 01/22/19 0506  NA 146* 148*  K 4.2 3.5  CL 108 112*  CO2 29 28  GLUCOSE 137* 122*  BUN 24* 18  CREATININE 0.44* 0.37*  CALCIUM 9.3 8.8*  AST 19  --   ALT 33  --   ALKPHOS 92  --   BILITOT 0.7   --    ------------------------------------------------------------------------------------------------------------------  Cardiac Enzymes No results for input(s): TROPONINI in the last 168 hours. ------------------------------------------------------------------------------------------------------------------  RADIOLOGY:  Dg Chest Portable 1 View  Result Date: 01/21/2019 CLINICAL DATA:  Altered mental status. EXAM: PORTABLE CHEST 1 VIEW COMPARISON:  Radiograph October 31, 2018. FINDINGS: Stable cardiomegaly. No pneumothorax or pleural effusion is noted. Lungs are clear. Bony thorax is unremarkable. IMPRESSION: No active disease. Electronically Signed   By: [name] M.D.   On: 01/21/2019 16:02     ASSESSMENT AND PLAN:   Sepsis secondary to UTI in the setting of chronic indwelling Foley catheter On ceftriaxone Waiting for cx  Rectal bleeding- appears due to due to skin breakdown of the anal area. Hemoglobin is stable -Will hold off on GI consult at this time -Recheck hemoglobin in the morning  shingles- located on the right side of his back -Started valacyclovir 1000mg  tid x 7 days - Isolation  Hypertension- BP normal in the ED -Continue home Coreg  History of multiple strokes with PEG tube in place. Patient is non-verbal at baseline. 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  CODE STATUS: FULL CODE  DVT Prophylaxis: SCDs  TOTAL TIME TAKING CARE OF THIS PATIENT: 35  minutes.   POSSIBLE D/C IN 1-2 DAYS, DEPENDING ON CLINICAL CONDITION.  Leia Alf Haille Pardi M.D on 01/22/2019 at 7:30 PM  Between 7am to 6pm - Pager - [phone]  After 6pm go to [email or web address] - password [email or web address] [facility]  Office  [phone]  CC: Primary care physician; Garwin Brothers, MD  Note: This dictation was prepared with Dragon dictation along with smaller phrase technology. Any transcriptional errors that result from this process are unintentional.

## 2019-01-22 NOTE — Consult Note (Signed)
[place] Nurse wound consult note Patient receiving care in [facility] 228.  Although there was not an order in the EMR for Airborne Precautions, upon arrival to the patient's room it was posted as such.  I confirmed with the Charge Nurse and the primary RN, that the patient is on Airborne Precautions.  For that reason, I was unable to enter the room.  I had a lengthy discussion with Dawn, primary RN about the patient's skin conditions. Reason for Consult: "skin breakdown in the anal area" Wound type: According to [facility], the area of breakdown is more in the coccyx area.  It is yellow with a black hole.  This would be consistent with an unstageable PI.  Also, the skin on the patient's buttocks is red and peeling.  This could possibly be related to fungal involvement.  On the patient's back there is an area of potential shingles, there is a photo of this area in the EMR.  And, on the the lateral LLE there is an irregularly shaped wound that is yellow and weeping.  I have placed the following request in the SecureChat area to Dr. Chauncey Cruel. Sudini:  "Good morning, Please place photos of the following areas in the chart: Coccyx, buttocks, lateral left lower leg. I have entered orders for these areas, but I am unable to enter the room due to Airborne Precautions, so having photos will help me meet the patient needs best for these areas on the body. Thank you, Claudina Lick nurse."  Pressure Injury POA: Yes Measurement: To be provided by the bedside RN in the flowsheet section Wound bed: To be provided by the bedside RN in the flowsheet section Drainage (amount, consistency, odor) As above Periwound: unclear at this time Dressing procedure/placement/frequency: For the coccyx:  Apply Santyl to the coccyx wound in a nickel thick layer. Cover with a saline moistened gauze, then dry gauze or ABD pad.  Change daily.  For the lateral left lower leg:  Apply Xeroform gauze Kellie Simmering [phone]), wrap with kerlex, change daily. For the shingles on the  back:  Cover with Telfa pad, tape in place. Change daily. For the buttocks:  Apply antifungal powder (from the green and white bottle in clean utility) 4 times daily. Monitor the wound area(s) for worsening of condition such as: Signs/symptoms of infection,  Increase in size,  Development of or worsening of odor, Development of pain, or increased pain at the affected locations.  Notify the medical team if any of these develop.  Thank you for the consult.  Discussed plan of care with the bedside nurse.  [place] nurse will not follow at this time.  Please re-consult the [place] team if needed.  Val Riles, RN, MSN, CWOCN, CNS-BC, pager [phone]

## 2019-01-22 NOTE — Progress Notes (Signed)
Initial Nutrition Assessment  RD working remotely.  DOCUMENTATION CODES:   Not applicable  INTERVENTION:  Provide Osmolite 1.5 Cal at goal rate of 60 mL/hr + Pro-Stat 30 mL BID per tube. Provides 2360 kcal, 120 grams of protein, 1094 mL H2O daily.  Continuefree water flush of150 mL Q4hrs. This provides a total of 1994 mL H2O daily including water in tube feeding.  Goal regimen meets 100% RDIs for vitamins/minerals.  Continue Juven BID per tube to promote wound healing.  Recommend measuring a height and weight.  NUTRITION DIAGNOSIS:   Inadequate oral intake related to inability to eat as evidenced by NPO status(reliance on tube feed regimen to meet calorie/protein/hydration needs).  GOAL:   Patient will meet greater than or equal to 90% of their needs  MONITOR:   Labs, Weight trends, TF tolerance, Skin, I & O's  REASON FOR ASSESSMENT:   New TF    ASSESSMENT:   63 year old male with PMHx of HTN, HLD, depression, anxiety, hx CVA, hx drug abuse, hx cerebral hemorrhage s/p left frontal craniotomy, hx G-tube placement 11/01/2018 admitted from [facility] with sepsis secondary to UTI in setting of chronic indwelling Foley catheter, rectal bleeding, possible shingles.   Patient known to this RD from previous admission where G-tube was placed. Left voicemail with RD at [facility] to discuss patient and see how he has been tolerating tube feeds or if any changes have been made. Did not hear back from RD yet. Patient was previously on Osmolite 1.5 Cal at 60 mL/hr + Pro-Stat 30 mL BID + free water flush 150 mL Q4hrs. Tube feeds currently ordered at 40 mL/hr.  No weight taken from this admission. Recommend obtaining weight so trend can be monitored. Will utilize needs from last admission at this time.  Enteral Access: tube not currently documented in chart but pt had a G-tube placed on 6/5  Medications reviewed and include: Keppra, 1/2NS at 75 mL/hr,  ceftriaxone.  Labs reviewed: Sodium 148, Chloride 112, Creatinine 0.37.  NUTRITION - FOCUSED PHYSICAL EXAM:  Unable to complete at this time.  Diet Order:   Diet Order            Diet NPO time specified  Diet effective now             EDUCATION NEEDS:   No education needs have been identified at this time  Skin:  Skin Assessment: Skin Integrity Issues:(unstageable PI to coccyx; MSAD groin, thigh, buttocks; rash to back and chest)  Last BM:  Unknown  Height:   Ht Readings from Last 1 Encounters:  11/29/18 5\' 10"  (1.778 m)   Weight:   Wt Readings from Last 1 Encounters:  11/29/18 99.8 kg   Ideal Body Weight:     BMI:  There is no height or weight on file to calculate BMI.  Estimated Nutritional Needs:   Kcal:  [id number]  Protein:  115-130 grams  Fluid:  2 L/day  Willey Blade, MS, RD, LDN Office: [phone] Pager: [phone] After Hours/Weekend Pager: [phone]

## 2019-01-22 NOTE — NC FL2 (Signed)
[place] LEVEL OF CARE SCREENING TOOL     IDENTIFICATION  Patient Name: [name] Birthdate: November 12, 1955 Sex: male Admission Date (Current Location): 01/21/2019  Phil [place] and [place] Number:  Engineering geologist and Address:  [facility], [address], [place], [place] [address]      Provider Number: [id number]  Attending Physician Name and Address:  Hillary Bow, MD  Relative Name and Phone Number:       Current Level of Care: Hospital Recommended Level of Care: [place] Prior Approval Number:    Date Approved/Denied:   PASRR Number: [id number]  Discharge Plan: SNF    Current Diagnoses: Patient Active Problem List   Diagnosis Date Noted  . Palliative care encounter 11/29/2018  . Pressure injury of skin 10/23/2018  . Sepsis ([place]) 10/19/2018  . Severe major depression ([place]) 04/14/2017  . Major depressive disorder, recurrent ([place]) 04/09/2017  . MDD (major depressive disorder), recurrent severe, without psychosis ([place]) 04/09/2017  . Family history of brain aneurysm   . Intractable vomiting 10/20/2016  . History of intracranial aneurysm   . Hypokalemia   . AKI (acute kidney injury) ([place])   . Prolonged QT interval   . Lactic acidosis   . Dysphagia   . Esophageal ring   . Gastroesophageal reflux disease with esophagitis   . Nausea and vomiting 10/01/2016  . 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 Severe episode of recurrent major depressive disorder, without psychotic features ([place]) 07/20/2016  . CVA (cerebral vascular accident) ([place]) 06/15/2016  . Abdominal pain   . Numbness   . Carpal tunnel syndrome, bilateral 12/16/2015  . Status post stroke 11/12/2015  . Transient neurologic deficit 10/15/2015  . Cerebrovascular accident, late effects 10/07/2015  . Complicated migraine   . Anxiety state   . 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 Cerebral infarction due to embolism of right middle cerebral artery  ([place]) 09/23/2015  . Cerebral infarction due to embolism of cerebral artery ([place]) 09/23/2015  . Gait disturbance, post-stroke   . Drooping of mouth   . Cerebrovascular accident (CVA) due to embolism of right anterior cerebral artery ([place])   . Benign essential HTN   . Migraine with aura and without status migrainosus, not intractable   . Tachypnea   . Prediabetes   . Acute blood loss anemia   . Brain aneurysm 09/20/2015  . Type 2 diabetes mellitus ([place]) 09/15/2015  . Leukocytosis 09/15/2015  . Aneurysm, cerebral, nonruptured 09/15/2015  . TIA (transient ischemic attack) 09/14/2015  . Temporary cerebral vascular dysfunction 09/14/2015  . H/O transient cerebral ischemia 07/29/2015  . At risk for falling 07/29/2015  . Bilateral hearing loss 10/25/2014  . Abnormal fear 08/08/2012  . Headache, migraine 08/08/2012  . Arthritis, degenerative 08/08/2012  . History of tear of ACL (anterior cruciate ligament) 03/08/2011  . History of knee problem 03/08/2011  . Depression 09/07/2010  . Depressive disorder, not elsewhere classified 09/07/2010  . URI (upper respiratory infection) 08/24/2010  . Hearing loss 09/06/2006  . Hyperlipidemia 04/23/2006  . Essential hypertension 04/23/2006  . Psychophysiological insomnia 04/23/2006    Orientation RESPIRATION BLADDER Height & Weight        O2(2L [place]) Incontinent, Indwelling catheter Weight:   Height:     BEHAVIORAL SYMPTOMS/MOOD NEUROLOGICAL BOWEL NUTRITION STATUS      Incontinent Diet, Feeding tube(NPO. Peg tube with tube feeds)  AMBULATORY STATUS COMMUNICATION OF NEEDS Skin   Total Care Does not communicate PU Stage and Appropriate Care, Other (Comment)(unstageable)  Personal Care Assistance Level of Assistance  Total care       Total Care Assistance: Maximum assistance   Functional Limitations Info  Hearing, Speech   Hearing Info: Impaired Speech Info: Impaired    SPECIAL CARE FACTORS FREQUENCY                        Contractures      Additional Factors Info  Code Status, Allergies, Isolation Precautions Code Status Info: FULL Allergies Info: Metformin     Isolation Precautions Info: airborne shingles, contact + MRSA nares     Current Medications (01/22/2019):  This is the current hospital active medication list Current Facility-Administered Medications  Medication Dose Route Frequency Provider Last Rate Last Dose  . 0.45 % sodium chloride infusion   Intravenous Continuous Hillary Bow, MD 75 mL/hr at 01/22/19 1257    . acetaminophen (TYLENOL) tablet 650 mg  650 mg Per Tube Q6H PRN Mayo, Pete Pelt, MD       Or  . acetaminophen (TYLENOL) suppository 650 mg  650 mg Rectal Q6H PRN Mayo, Pete Pelt, MD      . albuterol (PROVENTIL) (2.5 MG/3ML) 0.083% nebulizer solution 2.5 mg  2.5 mg Nebulization Q4H PRN Mayo, Pete Pelt, MD      . atorvastatin (LIPITOR) tablet 40 mg  40 mg Per Tube QPM Mayo, Pete Pelt, MD      . baclofen (LIORESAL) tablet 10 mg  10 mg Oral TID Sela Hua, MD   10 mg at 01/22/19 0208  . carvedilol (COREG) tablet 25 mg  25 mg Oral BID WC Mayo, Pete Pelt, MD   25 mg at 01/22/19 1250  . cefTRIAXone (ROCEPHIN) 1 g in sodium chloride 0.9 % 100 mL IVPB  1 g Intravenous Q24H Mayo, Pete Pelt, MD 200 mL/hr at 01/22/19 0206 1 g at 01/22/19 0206  . Chlorhexidine Gluconate Cloth 2 % PADS 6 each  6 each Topical Q0600 Hillary Bow, MD   6 each at 01/22/19 1250  . collagenase (SANTYL) ointment   Topical Daily Sudini, Srikar, MD      . doxazosin (CARDURA) tablet 2 mg  2 mg Oral QHS Mayo, Pete Pelt, MD   2 mg at 01/22/19 0207  . feeding supplement (OSMOLITE 1.5 CAL) liquid 1,000 mL  1,000 mL Per Tube Continuous Sudini, Srikar, MD      . feeding supplement (PRO-STAT SUGAR FREE 64) liquid 30 mL  30 mL Per Tube BID Mayo, Pete Pelt, MD   30 mL at 01/22/19 1300  . finasteride (PROSCAR) tablet 5 mg  5 mg Oral QHS Mayo, Pete Pelt, MD   5 mg at 01/22/19 0207  . free water 150 mL  150  mL Per Tube Q4H Mayo, Pete Pelt, MD   150 mL at 01/22/19 1308  . levETIRAcetam (KEPPRA) tablet 750 mg  750 mg Oral BID Sela Hua, MD   750 mg at 01/22/19 0208  . methylphenidate (RITALIN) tablet 5 mg  5 mg Oral BID Mayo, Pete Pelt, MD      . mupirocin ointment (BACTROBAN) 2 % 1 application  1 application Nasal BID Hillary Bow, MD   1 application at [id number]  . nutrition supplement (JUVEN) (JUVEN) powder packet 1 packet  1 packet Per Tube BID BM Mayo, Pete Pelt, MD   1 packet at 01/22/19 1000  . polyethylene glycol (MIRALAX / GLYCOLAX) packet 17 g  17 g Oral Daily PRN Mayo, Pete Pelt, MD      .  valACYclovir (VALTREX) tablet 1,000 mg  1,000 mg Oral TID Sela Hua, MD   1,000 mg at 01/22/19 0208     Discharge Medications: Please see discharge summary for a list of discharge medications.  Relevant Imaging Results:  Relevant Lab Results:   Additional Information SS# [id number]  Beverly Sessions, RN

## 2019-01-23 LAB — BASIC METABOLIC PANEL
Anion gap: 8 (ref 5–15)
BUN: 17 mg/dL (ref 8–23)
CO2: 29 mmol/L (ref 22–32)
Calcium: 8.3 mg/dL — ABNORMAL LOW (ref 8.9–10.3)
Chloride: 110 mmol/L (ref 98–111)
Creatinine, Ser: 0.35 mg/dL — ABNORMAL LOW (ref 0.61–1.24)
GFR calc Af Amer: >60 mL/min (ref >60)
GFR calc non Af Amer: >60 mL/min (ref >60)
Glucose, Bld: 150 mg/dL — ABNORMAL HIGH (ref 70–99)
Potassium: 3.7 mmol/L (ref 3.5–5.1)
Sodium: 147 mmol/L — ABNORMAL HIGH (ref 135–145)

## 2019-01-23 LAB — CBC WITH DIFFERENTIAL/PLATELET
Abs Immature Granulocytes: 0.02 10*3/uL (ref 0.00–0.07)
Basophils Absolute: 0.1 10*3/uL (ref 0.0–0.1)
Basophils Relative: 1 %
Eosinophils Absolute: 1 10*3/uL — ABNORMAL HIGH (ref 0.0–0.5)
Eosinophils Relative: 13 %
HCT: 36.1 % — ABNORMAL LOW (ref 39.0–52.0)
Hemoglobin: 10.7 g/dL — ABNORMAL LOW (ref 13.0–17.0)
Immature Granulocytes: 0 %
Lymphocytes Relative: 19 %
Lymphs Abs: 1.5 10*3/uL (ref 0.7–4.0)
MCH: 26.3 pg (ref 26.0–34.0)
MCHC: 29.6 g/dL — ABNORMAL LOW (ref 30.0–36.0)
MCV: 88.7 fL (ref 80.0–100.0)
Monocytes Absolute: 0.7 10*3/uL (ref 0.1–1.0)
Monocytes Relative: 9 %
Neutro Abs: 4.7 10*3/uL (ref 1.7–7.7)
Neutrophils Relative %: 58 %
Platelets: 342 10*3/uL (ref 150–400)
RBC: 4.07 MIL/uL — ABNORMAL LOW (ref 4.22–5.81)
RDW: 15.4 % (ref 11.5–15.5)
WBC: 8 10*3/uL (ref 4.0–10.5)
nRBC: 0 % (ref 0.0–0.2)

## 2019-01-23 LAB — ALBUMIN: Albumin: 2.4 g/dL — ABNORMAL LOW (ref 3.5–5.0)

## 2019-01-23 MED ORDER — ORAL CARE MOUTH RINSE
15.0000 mL | Freq: Two times a day (BID) | OROMUCOSAL | Status: DC
  Administered 2019-01-24: 15 mL via OROMUCOSAL

## 2019-01-23 MED ORDER — SODIUM CHLORIDE 0.9 % IV SOLN
INTRAVENOUS | Status: DC | PRN
  Administered 2019-01-23: 1000 mL via INTRAVENOUS

## 2019-01-23 NOTE — TOC Progression Note (Addendum)
Transition of Care [facility]) - Progression Note    Patient Details  Name: [name] MRN: [id number] Date of Birth: 1955/10/02  Transition of Care [facility]) CM/SW [place], LCSW Phone Number: 01/23/2019, 4:19 PM  Clinical Narrative: [facility] has offered patient a bed. Daughter is aware and will see if her husband and mother are familiar with the facility.    4:32 pm: Patient's daughter has accepted [facility] bed offer. Admissions coordinator, Sharyn Lull, is aware and will contact daughter to discuss paperwork.  Expected Discharge Plan: [place]    Expected Discharge Plan and Services Expected Discharge Plan: [place]   Discharge Planning Services: CM Consult   Living arrangements for the past 2 months: Skilled Nursing Facility                                       Social Determinants of Health (SDOH) Interventions    Readmission Risk Interventions Readmission Risk Prevention Plan 11/08/2018  Post Dischage Appt Complete  Medication Screening Complete  Transportation Screening Complete  Some recent data might be hidden

## 2019-01-23 NOTE — Progress Notes (Signed)
Baseline confusion.  Dx sepsis.  Pt is resting well.  RR was elevated after assessment and reposition.

## 2019-01-23 NOTE — Progress Notes (Signed)
Pt physical condition poor. Patient hands and arms contracted. Tech and I pried hand open and thoroughly cleaned them. Full of what appeared to be dead oily skin. Same substance in patients ears, nose, and on his scalp. Used shower cap and tried to loosen the debris. Used a baby comb to remove loosened particles. Ears and nose cleaned. Patient shaved as well. All dressings changed on leg, coccyx, RU Back, under buttocks. Peri and Foley care performed. Pt had a decrease in penile drainage from yesterday to today. Groin area red. Used antifungal powder to keep dry and prevent further skin breakdown.   Pt feet placed in prevalon boots. Heels padded with pink foams. Pt on alternating air mattress. Pillows used to prop arms. Rolled wash cloths placed in patient hands. Mouth care protocol initiated and performed.

## 2019-01-23 NOTE — Progress Notes (Signed)
[place] [address] at [place] NAME: [name]    MR#:  [id number]  DATE OF BIRTH:  [birth year]  SUBJECTIVE:  CHIEF COMPLAINT:   Chief Complaint  Patient presents with  . Rectal Bleeding   Patient is awake.  Nonverbal.  Tolerating tube feeds well.  Afebrile.    REVIEW OF SYSTEMS:    Review of Systems  Unable to perform ROS: Dementia    DRUG ALLERGIES:   Allergies  Allergen Reactions  . Metformin Diarrhea    VITALS:  Blood pressure 116/66, pulse 93, temperature 100.2 F (37.9 C), temperature source Oral, resp. rate 20, SpO2 98 %.  PHYSICAL EXAMINATION:   Physical Exam  GENERAL:  63 y.o.-year-old patient lying in the bed with no acute distress.  EYES: Pupils equal, round, reactive to light and accommodation. No scleral icterus. Extraocular muscles intact.  HEENT: Head atraumatic, normocephalic. Oropharynx and nasopharynx clear.  NECK:  Supple, no jugular venous distention. No thyroid enlargement, no tenderness.  LUNGS: Normal breath sounds bilaterally, no wheezing, rales, rhonchi. No use of accessory muscles of respiration.  CARDIOVASCULAR: S1, S2 normal. No murmurs, rubs, or gallops.  ABDOMEN: Soft, nontender, nondistended. Bowel sounds present. No organomegaly or mass.  PEG tube Foley catheter EXTREMITIES: No cyanosis, clubbing or edema b/l.    NEUROLOGIC: Not following instructions PSYCHIATRIC: The patient is awake but non verbal SKIN: Multiple decubitus ulcers            LABORATORY PANEL:   CBC Recent Labs  Lab 01/23/19 0612  WBC 8.0  HGB 10.7*  HCT 36.1*  PLT 342   ------------------------------------------------------------------------------------------------------------------ Chemistries  Recent Labs  Lab 01/21/19 1510  01/23/19 0612  NA 146*   < > 147*  K 4.2   < > 3.7  CL 108   < > 110  CO2 29   < > 29  GLUCOSE 137*   < > 150*  BUN 24*   < > 17  CREATININE 0.44*   < > 0.35*  CALCIUM 9.3   < >  8.3*  AST 19  --   --   ALT 33  --   --   ALKPHOS 92  --   --   BILITOT 0.7  --   --    < > = values in this interval not displayed.   ------------------------------------------------------------------------------------------------------------------  Cardiac Enzymes No results for input(s): TROPONINI in the last 168 hours. ------------------------------------------------------------------------------------------------------------------  RADIOLOGY:  Dg Chest Portable 1 View  Result Date: 01/21/2019 CLINICAL DATA:  Altered mental status. EXAM: PORTABLE CHEST 1 VIEW COMPARISON:  Radiograph October 31, 2018. FINDINGS: Stable cardiomegaly. No pneumothorax or pleural effusion is noted. Lungs are clear. Bony thorax is unremarkable. IMPRESSION: No active disease. Electronically Signed   By: [name] M.D.   On: 01/21/2019 16:02     ASSESSMENT AND PLAN:   Sepsis secondary to UTI- POA  In the setting of chronic indwelling Foley catheter On ceftriaxone Waiting for cx  Rectal bleeding- appears due to due to skin breakdown of the anal area. Hemoglobin is stable  Herpes zoster- located on the right side of his back -on valacyclovir 1000mg  tid x 7 days - Isolation  Hypertension- BP normal in the ED -Continue home Coreg  History of multiple strokes with PEG tube in place. Patient is non-verbal at baseline. 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 Left calf pressure ulcer All POA Wound care team following  All the records are reviewed and case discussed with Care Management/Social  Worker Management plans discussed with the patient, family and they are in agreement.  CODE STATUS: FULL CODE  DVT Prophylaxis: SCDs  TOTAL TIME TAKING CARE OF THIS PATIENT: 35 minutes.   POSSIBLE D/C IN 1-2 DAYS, DEPENDING ON CLINICAL CONDITION.  Leia Alf Nishaan Stanke M.D on 01/23/2019 at 12:03 PM  Between 7am to 6pm - Pager - [phone]  After 6pm go to [email or web address] - password [email or web address] [place] [facility]  Office  [phone]  CC: Primary care physician; Garwin Brothers, MD  Note: This dictation was prepared with Dragon dictation along with smaller phrase technology. Any transcriptional errors that result from this process are unintentional.

## 2019-01-24 ENCOUNTER — Emergency Department: Payer: Medicare HMO

## 2019-01-24 ENCOUNTER — Emergency Department
Admission: EM | Admit: 2019-01-24 | Discharge: 2019-01-25 | Disposition: A | Discharge status: Other Acute Inpatient Hospital | Payer: Medicare HMO | Attending: Emergency Medicine | Admitting: Emergency Medicine

## 2019-01-24 ENCOUNTER — Other Ambulatory Visit: Payer: Self-pay

## 2019-01-24 DIAGNOSIS — R509 Fever, unspecified: Secondary | ICD-10-CM | POA: Insufficient documentation

## 2019-01-24 DIAGNOSIS — Z79899 Other long term (current) drug therapy: Secondary | ICD-10-CM | POA: Diagnosis not present

## 2019-01-24 DIAGNOSIS — Z8673 Personal history of transient ischemic attack (TIA), and cerebral infarction without residual deficits: Secondary | ICD-10-CM | POA: Diagnosis not present

## 2019-01-24 DIAGNOSIS — Z20828 Contact with and (suspected) exposure to other viral communicable diseases: Secondary | ICD-10-CM | POA: Diagnosis not present

## 2019-01-24 DIAGNOSIS — E119 Type 2 diabetes mellitus without complications: Secondary | ICD-10-CM | POA: Diagnosis not present

## 2019-01-24 DIAGNOSIS — I10 Essential (primary) hypertension: Secondary | ICD-10-CM | POA: Diagnosis not present

## 2019-01-24 LAB — BASIC METABOLIC PANEL
Anion gap: 7 (ref 5–15)
Anion gap: 8 (ref 5–15)
BUN: 15 mg/dL (ref 8–23)
BUN: 15 mg/dL (ref 8–23)
CO2: 27 mmol/L (ref 22–32)
CO2: 25 mmol/L (ref 22–32)
Calcium: 8.1 mg/dL — ABNORMAL LOW (ref 8.9–10.3)
Calcium: 8.4 mg/dL — ABNORMAL LOW (ref 8.9–10.3)
Chloride: 110 mmol/L (ref 98–111)
Chloride: 108 mmol/L (ref 98–111)
Creatinine, Ser: 0.39 mg/dL — ABNORMAL LOW (ref 0.61–1.24)
Creatinine, Ser: 0.37 mg/dL — ABNORMAL LOW (ref 0.61–1.24)
GFR calc Af Amer: >60 mL/min (ref >60)
GFR calc Af Amer: >60 mL/min (ref >60)
GFR calc non Af Amer: >60 mL/min (ref >60)
GFR calc non Af Amer: >60 mL/min (ref >60)
Glucose, Bld: 166 mg/dL — ABNORMAL HIGH (ref 70–99)
Glucose, Bld: 120 mg/dL — ABNORMAL HIGH (ref 70–99)
Potassium: 3.6 mmol/L (ref 3.5–5.1)
Potassium: 3.5 mmol/L (ref 3.5–5.1)
Sodium: 144 mmol/L (ref 135–145)
Sodium: 141 mmol/L (ref 135–145)

## 2019-01-24 LAB — CBC WITH DIFFERENTIAL/PLATELET
Abs Immature Granulocytes: 0.06 10*3/uL (ref 0.00–0.07)
Basophils Absolute: 0.1 10*3/uL (ref 0.0–0.1)
Basophils Relative: 1 %
Eosinophils Absolute: 1.2 10*3/uL — ABNORMAL HIGH (ref 0.0–0.5)
Eosinophils Relative: 10 %
HCT: 35.5 % — ABNORMAL LOW (ref 39.0–52.0)
Hemoglobin: 10.9 g/dL — ABNORMAL LOW (ref 13.0–17.0)
Immature Granulocytes: 1 %
Lymphocytes Relative: 18 %
Lymphs Abs: 2.1 10*3/uL (ref 0.7–4.0)
MCH: 26 pg (ref 26.0–34.0)
MCHC: 30.7 g/dL (ref 30.0–36.0)
MCV: 84.7 fL (ref 80.0–100.0)
Monocytes Absolute: 0.8 10*3/uL (ref 0.1–1.0)
Monocytes Relative: 7 %
Neutro Abs: 7.5 10*3/uL (ref 1.7–7.7)
Neutrophils Relative %: 63 %
Platelets: 401 10*3/uL — ABNORMAL HIGH (ref 150–400)
RBC: 4.19 MIL/uL — ABNORMAL LOW (ref 4.22–5.81)
RDW: 15 % (ref 11.5–15.5)
WBC: 11.7 10*3/uL — ABNORMAL HIGH (ref 4.0–10.5)
nRBC: 0 % (ref 0.0–0.2)

## 2019-01-24 LAB — SARS CORONAVIRUS 2 BY RT PCR (HOSPITAL ORDER, PERFORMED IN ~~LOC~~ HOSPITAL LAB): SARS Coronavirus 2: NEGATIVE

## 2019-01-24 LAB — URINE CULTURE: Culture: >=100000 — AB

## 2019-01-24 LAB — URINE DRUG SCREEN, QUALITATIVE (ARMC ONLY)
Amphetamines, Ur Screen: NOT DETECTED
Barbiturates, Ur Screen: NOT DETECTED
Benzodiazepine, Ur Scrn: NOT DETECTED
Cannabinoid 50 Ng, Ur ~~LOC~~: NOT DETECTED
Cocaine Metabolite,Ur ~~LOC~~: NOT DETECTED
MDMA (Ecstasy)Ur Screen: NOT DETECTED
Methadone Scn, Ur: NOT DETECTED
Opiate, Ur Screen: NOT DETECTED
Phencyclidine (PCP) Ur S: NOT DETECTED
Tricyclic, Ur Screen: NOT DETECTED

## 2019-01-24 MED ORDER — FAMOTIDINE 20 MG PO TABS
20.0000 mg | ORAL_TABLET | Freq: Two times a day (BID) | ORAL | Status: DC
  Administered 2019-01-25: 10:00:00 20 mg via ORAL
  Filled 2019-01-24: qty 1

## 2019-01-24 MED ORDER — BACLOFEN 10 MG PO TABS
10.0000 mg | ORAL_TABLET | Freq: Three times a day (TID) | ORAL | Status: DC
  Administered 2019-01-25: 10:00:00 10 mg via ORAL
  Filled 2019-01-24 (×4): qty 1

## 2019-01-24 MED ORDER — JUVEN PO PACK
1.0000 | PACK | Freq: Two times a day (BID) | ORAL | Status: DC
  Administered 2019-01-25: 11:00:00 1

## 2019-01-24 MED ORDER — FINASTERIDE 5 MG PO TABS
5.0000 mg | ORAL_TABLET | Freq: Every day | ORAL | Status: DC
  Filled 2019-01-24 (×2): qty 1

## 2019-01-24 MED ORDER — METHYLPHENIDATE HCL 5 MG PO TABS: 5.0000 mg | ORAL_TABLET | Freq: Two times a day (BID) | ORAL | Status: DC

## 2019-01-24 MED ORDER — FREE WATER
150.0000 mL | Status: DC
  Administered 2019-01-25: 08:00:00 300 mL
  Administered 2019-01-25: 03:00:00 150 mL
  Filled 2019-01-24 (×6): qty 150

## 2019-01-24 MED ORDER — OSMOLITE 1.5 CAL PO LIQD
1000.0000 mL | ORAL | Status: DC
  Administered 2019-01-25: 09:00:00 1000 mL

## 2019-01-24 MED ORDER — CEPHALEXIN 250 MG/5ML PO SUSR: 500.0000 mg | Freq: Three times a day (TID) | ORAL | 0 refills | Status: AC

## 2019-01-24 MED ORDER — ATORVASTATIN CALCIUM 20 MG PO TABS: 40.0000 mg | ORAL_TABLET | Freq: Every evening | ORAL | Status: DC

## 2019-01-24 MED ORDER — ALBUTEROL SULFATE (2.5 MG/3ML) 0.083% IN NEBU: 2.5000 mg | INHALATION_SOLUTION | RESPIRATORY_TRACT | Status: DC | PRN

## 2019-01-24 MED ORDER — VALACYCLOVIR HCL 1 G PO TABS: 1000.0000 mg | ORAL_TABLET | Freq: Three times a day (TID) | ORAL | 0 refills | Status: AC

## 2019-01-24 MED ORDER — ACETAMINOPHEN 325 MG PO TABS
650.0000 mg | ORAL_TABLET | Freq: Once | ORAL | Status: AC
  Administered 2019-01-24: 22:00:00 650 mg via ORAL
  Filled 2019-01-24: qty 2

## 2019-01-24 MED ORDER — CEPHALEXIN 250 MG/5ML PO SUSR
500.0000 mg | Freq: Three times a day (TID) | ORAL | Status: DC
  Administered 2019-01-25 (×2): 500 mg via ORAL
  Filled 2019-01-24 (×4): qty 10

## 2019-01-24 MED ORDER — LEVETIRACETAM 750 MG PO TABS
750.0000 mg | ORAL_TABLET | Freq: Two times a day (BID) | ORAL | Status: DC
  Administered 2019-01-25 (×2): 750 mg via ORAL
  Filled 2019-01-24 (×3): qty 1

## 2019-01-24 MED ORDER — CARVEDILOL 25 MG PO TABS
25.0000 mg | ORAL_TABLET | Freq: Two times a day (BID) | ORAL | Status: DC
  Administered 2019-01-25: 10:00:00 25 mg via ORAL
  Filled 2019-01-24: qty 1

## 2019-01-24 MED ORDER — POLYETHYLENE GLYCOL 3350 17 G PO PACK: 17.0000 g | PACK | Freq: Every day | ORAL | Status: DC | PRN

## 2019-01-24 MED ORDER — ORAL CARE MOUTH RINSE
15.0000 mL | Freq: Two times a day (BID) | OROMUCOSAL | Status: DC
  Filled 2019-01-24 (×2): qty 15

## 2019-01-24 MED ORDER — VALACYCLOVIR HCL 500 MG PO TABS
1000.0000 mg | ORAL_TABLET | Freq: Three times a day (TID) | ORAL | Status: DC
  Administered 2019-01-25: 11:00:00 1000 mg
  Filled 2019-01-24: qty 2

## 2019-01-24 MED ORDER — DOXAZOSIN MESYLATE 2 MG PO TABS
2.0000 mg | ORAL_TABLET | Freq: Every day | ORAL | Status: DC
  Filled 2019-01-24 (×2): qty 1

## 2019-01-24 NOTE — ED Provider Notes (Signed)
[facility] Emergency Department Provider Note  ____________________________________________   First MD Initiated Contact with Patient 01/24/19 1656     (approximate)  I have reviewed the triage vital signs and the nursing notes.   HISTORY  Chief Complaint Placement    HPI [name] is a 63 y.o. male cerebral hemorrhage s/p left frontal craniotomy, history of stroke, hypertension, prediabetes, hyperlipidemia  with recent admission for GI bleed found to be secondary to ulcer breakdown and sepsis who was  discharged today and getting rx for zoster and UTI.  EMS attempted to return patient to [facility] rehab in [place].  However the staff refused to except the patient therefore patient was brought back to the ER.  Pt had labs this morning that were normal.    Unable to get full HPI due to patient's baseline nonverbal.       Past Medical History:  Diagnosis Date  . Allergy   . Anxiety   . Cataract   . Cerebral hemorrhage ([place])   . Chest pain, atypical    12/2003:  negative cardiolyte  . Depression   . Drug abuse ([place])   . Eczema   . Erectile dysfunction   . History of meniscal tear    bilateral  . HLD (hyperlipidemia)   . HTN (hypertension)   . Hx of tear of ACL (anterior cruciate ligament)    right  . Insomnia   . Olecranon bursitis of left elbow 10/2009   s/p I&D by Dr Maxie Better, initially assessed by Dr. Nori Riis   . Prediabetes   . Stroke ([place])   . Suicidal behavior 04/13/2017   Pt states taking a bunch of sleeping pills to end life   . TIA (transient ischemic attack)     Patient Active Problem List   Diagnosis Date Noted  . Palliative care encounter 11/29/2018  . Pressure injury of skin 10/23/2018  . Sepsis ([place]) 10/19/2018  . Severe major depression ([place]) 04/14/2017  . Major depressive disorder, recurrent ([place]) 04/09/2017  . MDD (major depressive disorder), recurrent severe, without psychosis ([place]) 04/09/2017  . Family history of  brain aneurysm   . Intractable vomiting 10/20/2016  . History of intracranial aneurysm   . Hypokalemia   . AKI (acute kidney injury) ([place])   . Prolonged QT interval   . Lactic acidosis   . Dysphagia   . Esophageal ring   . Gastroesophageal reflux disease with esophagitis   . Nausea and vomiting 10/01/2016  . 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 Severe episode of recurrent major depressive disorder, without psychotic features ([place]) 07/20/2016  . CVA (cerebral vascular accident) ([place]) 06/15/2016  . Abdominal pain   . Numbness   . Carpal tunnel syndrome, bilateral 12/16/2015  . Status post stroke 11/12/2015  . Transient neurologic deficit 10/15/2015  . Cerebrovascular accident, late effects 10/07/2015  . Complicated migraine   . Anxiety state   . 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 Cerebral infarction due to embolism of right middle cerebral artery ([place]) 09/23/2015  . Cerebral infarction due to embolism of cerebral artery ([place]) 09/23/2015  . Gait disturbance, post-stroke   . Drooping of mouth   . Cerebrovascular accident (CVA) due to embolism of right anterior cerebral artery ([place])   . Benign essential HTN   . Migraine with aura and without status migrainosus, not intractable   . Tachypnea   . Prediabetes   . Acute blood loss anemia   . Brain aneurysm 09/20/2015  . Type 2  diabetes mellitus ([place]) 09/15/2015  . Leukocytosis 09/15/2015  . Aneurysm, cerebral, nonruptured 09/15/2015  . TIA (transient ischemic attack) 09/14/2015  . Temporary cerebral vascular dysfunction 09/14/2015  . H/O transient cerebral ischemia 07/29/2015  . At risk for falling 07/29/2015  . Bilateral hearing loss 10/25/2014  . Abnormal fear 08/08/2012  . Headache, migraine 08/08/2012  . Arthritis, degenerative 08/08/2012  . History of tear of ACL (anterior cruciate ligament) 03/08/2011  . History of knee problem 03/08/2011  . Depression 09/07/2010  . Depressive disorder, not elsewhere classified  09/07/2010  . URI (upper respiratory infection) 08/24/2010  . Hearing loss 09/06/2006  . Hyperlipidemia 04/23/2006  . Essential hypertension 04/23/2006  . Psychophysiological insomnia 04/23/2006    Past Surgical History:  Procedure Laterality Date  . EP IMPLANTABLE DEVICE N/A 12/30/2015   Procedure: Loop Recorder Insertion;  Surgeon: Thompson Grayer, MD;  Location: [place];  Service: Cardiovascular;  Laterality: N/A;  . ESOPHAGOGASTRODUODENOSCOPY N/A 10/03/2016   Procedure: ESOPHAGOGASTRODUODENOSCOPY (EGD);  Surgeon: Irene Shipper, MD;  Location: [facility] ENDOSCOPY;  Service: Endoscopy;  Laterality: N/A;  . INCISE AND DRAIN ABCESS     L elbow due to cellulitis/bursitis  . INNER EAR SURGERY    . IR GENERIC HISTORICAL  12/28/2015   IR ANGIO VERTEBRAL SEL VERTEBRAL UNI L MOD SED 12/28/2015 Luanne Bras, MD [facility]INTER[facility]  . IR GENERIC HISTORICAL  12/28/2015   IR ANGIO VERTEBRAL SEL SUBCLAVIAN INNOMINATE UNI R MOD SED 12/28/2015 Luanne Bras, MD [facility]  . IR GENERIC HISTORICAL  12/28/2015   IR ANGIO INTRA EXTRACRAN SEL INTERNAL CAROTID BILAT MOD SED 12/28/2015 Luanne Bras, MD [facility]  . KNEE ARTHROSCOPY    . PEG PLACEMENT N/A 11/01/2018   Procedure: PERCUTANEOUS ENDOSCOPIC GASTROSTOMY (PEG) PLACEMENT;  Surgeon: Lin Landsman, MD;  Location: [facility] ENDOSCOPY;  Service: Gastroenterology;  Laterality: N/A;  . RADIOLOGY WITH ANESTHESIA N/A 09/20/2015   Procedure: EMBOLIZATION         (RADIOLOGY WITH ANESTHESIA);  Surgeon: Luanne Bras, MD;  Location: [place];  Service: Radiology;  Laterality: N/A;  . SKIN TAG REMOVAL     11 removed  . TEE WITHOUT CARDIOVERSION N/A 12/30/2015   Procedure: TRANSESOPHAGEAL ECHOCARDIOGRAM (TEE);  Surgeon: Larey Dresser, MD;  Location: [place];  Service: Cardiovascular;  Laterality: N/A;    Prior to Admission medications   Medication Sig Start Date End Date Taking? Authorizing Provider  acetaminophen (TYLENOL) 325 MG tablet Take 650 mg  by mouth every 6 (six) hours as needed for mild pain or fever.    [provider]  albuterol (PROVENTIL) (2.5 MG/3ML) 0.083% nebulizer solution Take 2.5 mg by nebulization every 4 (four) hours as needed for wheezing or shortness of breath.    [provider]  Amino Acids-Protein Hydrolys (FEEDING SUPPLEMENT, PRO-STAT SUGAR FREE 64,) LIQD Place 30 mLs into feeding tube 2 (two) times daily. 11/26/18   Henreitta Leber, MD  atorvastatin (LIPITOR) 40 MG tablet Place 40 mg into feeding tube every evening.     [provider]  baclofen (LIORESAL) 10 MG tablet Take 1 tablet (10 mg total) by mouth 3 (three) times daily. 11/26/18   Henreitta Leber, MD  carvedilol (COREG) 25 MG tablet Take 25 mg by mouth 2 (two) times daily with a meal.    [provider]  cephALEXin (KEFLEX) 250 MG/5ML suspension Take 10 mLs (500 mg total) by mouth 3 (three) times daily. 01/24/19   Hillary Bow, MD  collagenase (SANTYL) ointment Apply  1 application topically as directed. (apply to lower left leg with every day shift change)    [provider]  doxazosin (CARDURA) 2 MG tablet Take 2 mg by mouth at bedtime.    [provider]  famotidine (PEPCID) 20 MG tablet Take 20 mg by mouth 2 (two) times daily.    [provider]  finasteride (PROSCAR) 5 MG tablet Take 1 tablet (5 mg total) by mouth at bedtime. 11/26/18   Henreitta Leber, MD  levETIRAcetam (KEPPRA) 750 MG tablet Take 750 mg by mouth 2 (two) times daily.    [provider]  methylphenidate (RITALIN) 5 MG tablet Take 5 mg by mouth 2 (two) times daily.    [provider]  mouth rinse LIQD solution 15 mLs by Mouth Rinse route 2 times daily at 12 noon and 4 pm. 11/26/18   Henreitta Leber, MD  nutrition supplement, JUVEN, (JUVEN) PACK Place 1 packet into feeding tube 2 (two) times daily between meals. 11/27/18   Henreitta Leber, MD  Nutritional Supplements (FEEDING SUPPLEMENT, OSMOLITE 1.5 CAL,) LIQD  Place 1,000 mLs into feeding tube continuous. 11/26/18   Henreitta Leber, MD  polyethylene glycol (MIRALAX / GLYCOLAX) 17 g packet Take 17 g by mouth daily as needed for mild constipation. 11/26/18   Henreitta Leber, MD  valACYclovir (VALTREX) 1000 MG tablet Place 1 tablet (1,000 mg total) into feeding tube 3 (three) times daily. 01/24/19   Hillary Bow, MD  Water For Irrigation, Sterile (FREE WATER) SOLN Place 150 mLs into feeding tube every 4 (four) hours. 11/26/18   Henreitta Leber, MD    Allergies Metformin  Family History  Problem Relation Age of Onset  . Stroke Mother   . Hypertension Mother   . Aneurysm Mother 91       Died of brain aneursym  . Heart failure Father   . Emphysema Father   . Diabetes Mellitus II Sister   . Colon cancer Neg Hx   . Rectal cancer Neg Hx   . Stomach cancer Neg Hx     Social History Social History   Tobacco Use  . Smoking status: Never Smoker  . Smokeless tobacco: Never Used  Substance Use Topics  . Alcohol use: No    Alcohol/week: 0.0 standard drinks  . Drug use: No      Review of Systems  Unable to get review of system due to patient's baseline nonverbal. ____________________________________________   PHYSICAL EXAM:  VITAL SIGNS: ED Triage Vitals  Enc Vitals Group     BP 01/24/19 1648 (!) 183/95     Pulse Rate 01/24/19 1643 99     Resp 01/24/19 1643 18     Temp 01/24/19 1643 98.3 F (36.8 C)     Temp Source 01/24/19 1643 Oral     SpO2 01/24/19 1642 94 %     Weight 01/24/19 1645 231 lb (104.8 kg)     Height 01/24/19 1645 6' (1.829 m)     Head Circumference --      Peak Flow --      Pain Score 01/24/19 1644 0     Pain Loc --      Pain Edu? --      Excl. in [place]? --     Constitutional: Alert looking around the room Eyes: Conjunctivae are normal. EOMI. Head: Atraumatic. Nose: No congestion/rhinnorhea. Mouth/Throat: Mucous membranes are moist.   Neck: No stridor. Trachea Midline. FROM Cardiovascular: Normal rate,  regular rhythm.  Grossly normal heart sounds.  Good peripheral circulation. Respiratory: Normal respiratory effort.  No retractions. Lungs CTAB. Gastrointestinal: Soft and nontender. No distention. No abdominal bruits.  Musculoskeletal: No lower extremity tenderness nor edema.  SCDs in place no joint effusions. Neurologic: Unable to fully assess due to patient's baseline nonverbal but no new gross deficits. Skin:  Skin is warm, dry and intact. No rash noted. Psychiatric: Unable to assess due to patient being nonverbal GU: Deferred   ____________________________________________   LABS (all labs ordered are listed, but only abnormal results are displayed)  Labs Reviewed  CBC WITH DIFFERENTIAL/PLATELET - Abnormal; Notable for the following components:      Result Value   WBC 11.7 (*)    RBC 4.19 (*)    Hemoglobin 10.9 (*)    HCT 35.5 (*)    Platelets 401 (*)    Eosinophils Absolute 1.2 (*)    All other components within normal limits  BASIC METABOLIC PANEL - Abnormal; Notable for the following components:   Glucose, Bld 120 (*)    Creatinine, Ser 0.37 (*)    Calcium 8.4 (*)    All other components within normal limits  SARS CORONAVIRUS 2 (HOSPITAL ORDER, [place])  URINE DRUG SCREEN, QUALITATIVE ([facility] ONLY)  LACTIC ACID, PLASMA  LACTIC ACID, PLASMA  URINALYSIS, ROUTINE W REFLEX MICROSCOPIC   ____________________________________________  _____________  RADIOLOGY Robert Bellow, personally viewed and evaluated these images (plain radiographs) as part of my medical decision making, as well as reviewing the written report by the radiologist.  ED MD interpretation:  No PNA  Official radiology report(s): Dg Chest 1 View  Result Date: 01/24/2019 CLINICAL DATA:  Fever EXAM: CHEST  1 VIEW COMPARISON:  01/21/2019 FINDINGS: Cardiomegaly. No confluent airspace opacities or effusions. No acute bony abnormality. IMPRESSION: Cardiomegaly.  No active disease.  Electronically Signed   By: Rolm Baptise M.D.   On: 01/24/2019 20:49    ____________________________________________   PROCEDURES  Procedure(s) performed (including Critical Care):  Procedures   ____________________________________________   INITIAL IMPRESSION / ASSESSMENT AND PLAN / ED COURSE  [name] was evaluated in Emergency Department on 01/24/2019 for the symptoms described in the history of present illness. He was evaluated in the context of the global COVID-19 pandemic, which necessitated consideration that the patient might be at risk for infection with the SARS-CoV-2 virus that causes COVID-19. Institutional protocols and algorithms that pertain to the evaluation of patients at risk for COVID-19 are in a state of rapid change based on information released by regulatory bodies including the CDC and federal and state organizations. These policies and algorithms were followed during the patient's care in the ED.    Patient presents after being rejected at the care facility. Will try to get more information on why this occurred.    5:11 PM discussed with the nurse case manager who has helped set up patient's transfer to the [facility] rehab.  When patient got there there was a report of a temperature above 99.  No Tylenol was given.  EMS temperature was 98.4 however they thought that this was secondary to him not closing his mouth fully so they want to have patient sent back here for reevaluation of fever.  Here patient has a normal temperature orally.  Rectal temperature was 99.9.  We discussed with the [facility] and they want to make sure that his coronavirus testing is negative and have temp < 99 and they be  willing to accept him back.    Given the elevated temperature of 99.9 I reviewed patient's records.  Patient is currently getting treatment for UTI and herpes zoster's.  This could both be contributing to a very low-grade rectal temperature.  Will make sure the  coronavirus testing is negative.  Patient has no abdominal pain to suggest abdominal infection.  Will repeat labs given patient's white count was normal at last check to make sure that is not grossly elevating.  Temp now 100.1. Pt technically does not meet SIRS criteria with only slightly elevated HR.  I did d/w medicine team given bounce back but they agreed fever is most likely from UTI or zoster that is currently being rx.  Pt did not meet criteria for admission at this time.   9:39 PM discussed with the [facility] center.  They are not willing accept the patient back tonight given the temperature elevation.  They said they will call back tomorrow if he continues to be afebrile.   Labs show a white count of 11.7 slightly elevated from 8.  Chest x-ray is without evidence of pneumonia.  Will add on urine to evaluate for UTI and lactate.    Pt handed off to incoming team pending UA, lactate, SW consult in AM.  Will need to repeat vitals and if worsening consider admission vs going back to SNF.        ____________________________________________   FINAL CLINICAL IMPRESSION(S) / ED DIAGNOSES   Final diagnoses:  Fever, unspecified fever cause      MEDICATIONS GIVEN DURING THIS VISIT:  Medications  albuterol (PROVENTIL) (2.5 MG/3ML) 0.083% nebulizer solution 2.5 mg (has no administration in time range)  atorvastatin (LIPITOR) tablet 40 mg (has no administration in time range)  baclofen (LIORESAL) tablet 10 mg (10 mg Oral Not Given 01/25/19 0050)  carvedilol (COREG) tablet 25 mg (has no administration in time range)  cephALEXin (KEFLEX) 250 MG/5ML suspension 500 mg (has no administration in time range)  doxazosin (CARDURA) tablet 2 mg (2 mg Oral Not Given 01/25/19 0050)  famotidine (PEPCID) tablet 20 mg (20 mg Oral Not Given 01/25/19 0049)  finasteride (PROSCAR) tablet 5 mg (5 mg Oral Not Given 01/25/19 0049)  levETIRAcetam (KEPPRA) tablet 750 mg (has no administration in time range)   MEDLINE mouth rinse (has no administration in time range)  nutrition supplement (JUVEN) (JUVEN) powder packet 1 packet (has no administration in time range)  feeding supplement (OSMOLITE 1.5 CAL) liquid 1,000 mL (has no administration in time range)  polyethylene glycol (MIRALAX / GLYCOLAX) packet 17 g (has no administration in time range)  valACYclovir (VALTREX) tablet 1,000 mg (1,000 mg Per Tube Not Given 01/25/19 0050)  free water 150 mL (has no administration in time range)  methylphenidate (RITALIN) tablet 5 mg (has no administration in time range)  acetaminophen (TYLENOL) tablet 650 mg (650 mg Oral Given 01/24/19 2156)     ED Discharge Orders    None       Note:  This document was prepared using Dragon voice recognition software and may include unintentional dictation errors.   Vanessa [place], MD 01/25/19 [phone]

## 2019-01-24 NOTE — ED Notes (Signed)
While taking rectal temp this RN noticed that this patient has sacral wounds

## 2019-01-24 NOTE — Discharge Summary (Signed)
[facility] [place] NAME: [name]    MR#:  [id number]  DATE OF BIRTH:  1955/10/24  DATE OF ADMISSION:  01/21/2019 ADMITTING PHYSICIAN: Sela Hua, MD  DATE OF DISCHARGE: 01/24/2019  PRIMARY CARE PHYSICIAN: Garwin Brothers, MD   ADMISSION DIAGNOSIS:  Cellulitis of buttock [L03.317] Pressure injury of skin of left upper back, unspecified injury stage [L89.129] Sepsis without acute organ dysfunction, due to unspecified organism ([place]) [A41.9]  DISCHARGE DIAGNOSIS:  Active Problems:   Sepsis ([place])  SECONDARY DIAGNOSIS:   Past Medical History:  Diagnosis Date  . Allergy   . Anxiety   . Cataract   . Cerebral hemorrhage ([place])   . Chest pain, atypical    12/2003:  negative cardiolyte  . Depression   . Drug abuse ([place])   . Eczema   . Erectile dysfunction   . History of meniscal tear    bilateral  . HLD (hyperlipidemia)   . HTN (hypertension)   . Hx of tear of ACL (anterior cruciate ligament)    right  . Insomnia   . Olecranon bursitis of left elbow 10/2009   s/p I&D by Dr Maxie Better, initially assessed by Dr. Nori Riis   . Prediabetes   . Stroke ([place])   . Suicidal behavior 04/13/2017   Pt states taking a bunch of sleeping pills to end life   . TIA (transient ischemic attack)      ADMITTING HISTORY  HISTORY OF PRESENT ILLNESS:  [name]  is a 63 y.o. male with a known history of cerebral hemorrhage s/p left frontal craniotomy, history of stroke, hypertension, prediabetes, hyperlipidemia who was sent to the ED from [facility] due to concern for rectal bleeding.  Patient is nonverbal at baseline, so history is obtained from the chart and from the ED physician.  In the ED, he was meeting sepsis criteria with tachycardia, tachypnea, and leukocytosis.  Labs were significant for WBC 17.4.  UA with large leukocytes, positive nitrites, many bacteria.  Chest x-ray was negative.  Blood and urine cultures were ordered.  Empiric  antibiotics were started.  Hospitalists were called for admission.   HOSPITAL COURSE:   Sepsis secondary to UTI- POA Catheter related Ucx with Proteus mirabilis Sensitive to keflex Treat for 1 more week PO  Rectal bleeding- appears due to due to skin breakdown of the anal area. Hemoglobin is stable  Herpes zoster- located on the right side of his back -on valacyclovir 1000mg  tid .-4 more days after discharge  Hypertension-BP normal in the ED -Continue home Coreg  History of multiple strokes with PEG tube in place. Patient is non-verbal at baseline. 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 Left calf pressure ulcer All POA Wound care  Apply Santyl to the coccyx wound in a nickel thick layer. Cover with a saline moistened gauze, then dry gauze or ABD pad. Change daily.  Apply Xeroform gauze to the lateral left lower leg wound, wrap with kerlex, change daily.  Apply antifungal powder (from the green and white bottle in clean utility) 4 times daily to the buttocks.  Stable for discharge to SNF  Will need  palliative care following at SNF  CONSULTS OBTAINED:   Wound care  DRUG ALLERGIES:   Allergies  Allergen Reactions  . Metformin Diarrhea    DISCHARGE MEDICATIONS:   Allergies as of 01/24/2019      Reactions   Metformin Diarrhea      Medication List    STOP taking these medications   modafinil 200 MG tablet Commonly known as: PROVIGIL     TAKE these medications   acetaminophen 325 MG tablet Commonly known as: TYLENOL Take 650 mg by mouth every 6 (six) hours as  needed for mild pain or fever.   albuterol (2.5 MG/3ML) 0.083% nebulizer solution Commonly known as: PROVENTIL Take 2.5 mg by nebulization every 4 (four) hours as needed for wheezing or shortness of breath.   atorvastatin 40 MG tablet Commonly known as: LIPITOR Place 40 mg into feeding tube every evening.   baclofen 10 MG tablet Commonly known as: LIORESAL Take 1 tablet (10 mg total) by mouth 3 (three) times daily.   carvedilol 25 MG tablet Commonly known as: COREG Take 25 mg by mouth 2 (two) times daily with a meal.   cephALEXin 250 MG/5ML suspension Commonly known as: KEFLEX Take 10 mLs (500 mg total) by mouth 3 (three) times daily.   collagenase ointment Commonly known as: SANTYL Apply 1 application topically as directed. (apply to lower left leg with every day shift change)   doxazosin 2 MG tablet Commonly known as: CARDURA Take 2 mg by mouth at bedtime.   famotidine 20 MG tablet Commonly known as: PEPCID Take 20 mg by mouth 2 (two) times daily.   feeding supplement (OSMOLITE 1.5 CAL) Liqd Place 1,000 mLs into feeding tube continuous.   nutrition supplement (JUVEN) Pack Place 1 packet into feeding tube 2 (two) times daily between meals.   feeding supplement (PRO-STAT SUGAR FREE 64) Liqd Place 30 mLs into feeding tube 2 (two) times daily.   finasteride 5 MG tablet Commonly known as: PROSCAR Take 1 tablet (5 mg total) by mouth at bedtime.   free water Soln Place 150 mLs into feeding tube every 4 (four) hours.   levETIRAcetam 750 MG tablet Commonly known as: KEPPRA Take 750 mg by mouth 2 (two) times daily.   methylphenidate 5 MG tablet Commonly known as: RITALIN Take 5 mg by mouth 2 (two) times daily.   mouth rinse Liqd solution 15 mLs by Mouth Rinse route 2 times daily at 12 noon and 4 pm.   polyethylene glycol 17 g packet Commonly known as: MIRALAX / GLYCOLAX Take 17 g by mouth daily as needed for mild constipation.   valACYclovir 1000 MG  tablet Commonly known as: VALTREX Place 1 tablet (1,000 mg total) into feeding tube 3 (three) times daily.       Today   VITAL SIGNS:  Blood pressure 132/74, pulse 93, temperature 99.2 F (37.3 C), temperature source Oral, resp. rate 20, SpO2 95 %.  I/O:    Intake/Output Summary (Last 24 hours) at 01/24/2019 1109 Last data filed at 01/24/2019 1106 Gross per 24 hour  Intake 429 ml  Output 2675 ml  Net -2246 ml    PHYSICAL EXAMINATION:  Physical Exam  GENERAL:  63 y.o.-year-old patient lying in the bed with no acute distress.  LUNGS: Normal breath sounds bilaterally, no wheezing, rales,rhonchi or crepitation. No use of accessory muscles of respiration.  CARDIOVASCULAR: S1, S2 normal. No murmurs, rubs, or gallops.  ABDOMEN: Soft, non-tender, non-distended. Bowel sounds present. No organomegaly or mass.  PEG tube NEUROLOGIC: Moves all 4 extremities. PSYCHIATRIC: The patient is alert and awake. NON verbal SKIN: Multiple pressure ulcers  DATA REVIEW:   CBC Recent Labs  Lab 01/23/19 0612  WBC 8.0  HGB 10.7*  HCT 36.1*  PLT 342    Chemistries  Recent Labs  Lab 01/21/19 1510  01/24/19 0803  NA 146*   < > 144  K 4.2   < > 3.6  CL 108   < > 110  CO2 29   < > 27  GLUCOSE 137*   < > 166*  BUN 24*   < > 15  CREATININE 0.44*   < > 0.39*  CALCIUM 9.3   < > 8.1*  AST 19  --   --   ALT 33  --   --   ALKPHOS 92  --   --   BILITOT 0.7  --   --    < > = values in this interval not displayed.    Cardiac Enzymes No results for input(s): TROPONINI in the last 168 hours.  Microbiology Results  Results for orders placed or performed during the hospital encounter of 01/21/19  Urine culture     Status: Abnormal   Collection Time: 01/21/19  3:10 PM   Specimen: Urine, Catheterized  Result Value Ref Range Status   Specimen Description   Final    URINE, CATHETERIZED Performed at [facility], [address] [place], [place] [address]    Special Requests    Final    NONE Performed at [facility], [address] [place], [place] [address]    Culture >=100,000 COLONIES/mL PROTEUS MIRABILIS (A)  Final   Report Status 01/24/2019 FINAL  Final   Organism ID, Bacteria PROTEUS MIRABILIS (A)  Final      Susceptibility   Proteus mirabilis - MIC*    AMPICILLIN <=2 SENSITIVE Sensitive     CEFAZOLIN <=4 SENSITIVE Sensitive     CEFTRIAXONE <=1 SENSITIVE Sensitive     CIPROFLOXACIN >=4 RESISTANT Resistant     GENTAMICIN <=1 SENSITIVE Sensitive     IMIPENEM 2 SENSITIVE Sensitive     NITROFURANTOIN 128 RESISTANT Resistant     TRIMETH/SULFA >=320 RESISTANT Resistant     AMPICILLIN/SULBACTAM <=2 SENSITIVE Sensitive     PIP/TAZO <=4 SENSITIVE Sensitive     * >=100,000 COLONIES/mL PROTEUS MIRABILIS  Blood culture (routine x 2)     Status: None (Preliminary result)   Collection Time: 01/21/19  4:15 PM   Specimen: BLOOD  Result Value Ref Range Status   Specimen Description BLOOD BLOOD LEFT FOREARM  Final   Special Requests   Final    BOTTLES DRAWN AEROBIC AND ANAEROBIC Blood Culture adequate volume   Culture   Final    NO GROWTH 3 DAYS Performed at [facility], [address], [place], [place] [address]    Report Status PENDING  Incomplete  Blood culture (routine x 2)     Status: None (Preliminary result)   Collection Time: 01/21/19  4:15 PM   Specimen: BLOOD  Result Value Ref Range Status   Specimen Description BLOOD BLOOD LEFT HAND  Final   Special Requests   Final    BOTTLES DRAWN AEROBIC AND ANAEROBIC Blood Culture adequate volume   Culture   Final    NO GROWTH 3 DAYS Performed at [facility], [address], [place], [place] [address]    Report Status PENDING  Incomplete  SARS CORONAVIRUS 2 (TAT 6-12  HRS) Nasal Swab Aptima Multi Swab     Status: None   Collection Time: 01/21/19  4:17 PM   Specimen: Aptima Multi Swab; Nasal Swab  Result Value Ref Range Status   SARS Coronavirus 2 NEGATIVE NEGATIVE Final     Comment: (NOTE) SARS-CoV-2 target nucleic acids are NOT DETECTED. The SARS-CoV-2 RNA is generally detectable in upper and lower respiratory specimens during the acute phase of infection. Negative results do not preclude SARS-CoV-2 infection, do not rule out co-infections with other pathogens, and should not be used as the sole basis for treatment or other patient management decisions. Negative results must be combined with clinical observations, patient history, and epidemiological information. The expected result is Negative. Fact Sheet for Patients: SugarRoll.be Fact Sheet for Healthcare Providers: https://www.woods-mathews.com/ This test is not yet approved or cleared by the [place] FDA and  has been authorized for detection and/or diagnosis of SARS-CoV-2 by FDA under an Emergency Use Authorization (EUA). This EUA will remain  in effect (meaning this test can be used) for the duration of the COVID-19 declaration under Section 56 4(b)(1) of the Act, 21 U.S.C. section 360bbb-3(b)(1), unless the authorization is terminated or revoked sooner. Performed at [facility], [place] [address], [place], [place] [address]   MRSA PCR Screening     Status: Abnormal   Collection Time: 01/22/19  2:42 AM   Specimen: Nasal Mucosa; Nasopharyngeal  Result Value Ref Range Status   MRSA by PCR POSITIVE (A) NEGATIVE Final    Comment:        The GeneXpert MRSA Assay (FDA approved for NASAL specimens only), is one component of a comprehensive MRSA colonization surveillance program. It is not intended to diagnose MRSA infection nor to guide or monitor treatment for MRSA infections. RESULT CALLED TO, READ BACK BY AND VERIFIED WITH: STACY CLAY @418  01/22/2019 TTG Performed at [facility], [address], [place], [place] [address]     RADIOLOGY:  No results found.  Follow up with PCP in 1 week.  Management plans discussed with the  patient, family and they are in agreement.  CODE STATUS:     Code Status Orders  (From admission, onward)         Start     Ordered   01/23/19 1015  Do not attempt resuscitation (DNR)  Continuous    Question Answer Comment  In the event of cardiac or respiratory ARREST Do not call a "code blue"   In the event of cardiac or respiratory ARREST Do not perform Intubation, CPR, defibrillation or ACLS   In the event of cardiac or respiratory ARREST Use medication by any route, position, wound care, and other measures to relive pain and suffering. May use oxygen, suction and manual treatment of airway obstruction as needed for comfort.      01/23/19 1014        Code Status History    Date Active Date Inactive Code Status Order ID Comments User Context   01/21/2019 2224 01/23/2019 1014 Full Code [id number]  Sela Hua, MD Inpatient   10/19/2018 1309 11/28/2018 2354 Full Code [id number]  Fritzi Mandes, MD Inpatient   04/14/2017 2026 04/16/2017 1715 Full Code [id number]  Rozetta Nunnery, NP Inpatient   04/13/2017 1605 04/14/2017 1823 Full Code [id number]  Burgess Estelle, MD Inpatient   04/09/2017 1614 04/13/2017 1044 Full Code [id number]  Ethelene Hal, NP Inpatient   04/08/2017 1824 04/09/2017 1557 Full Code [id number]  Street, [place], [place] ED  04/02/2017 0426 04/03/2017 1829 Full Code [id number]  Recardo Evangelist, PA-C ED   03/12/2017 2121 03/13/2017 1342 Full Code [id number]  Orlie Dakin, MD ED   10/20/2016 1921 10/24/2016 1915 Full Code [id number]  Sela Hilding, MD Inpatient   10/01/2016 1632 10/05/2016 1633 Full Code [id number]  Tonette Bihari, MD ED   06/15/2016 1828 06/16/2016 2021 Full Code [id number]  Lavina Hamman, MD ED   12/28/2015 1729 12/31/2015 1826 Full Code [id number]  Luanne Bras, MD Inpatient   12/24/2015 1702 12/28/2015 1729 Full Code [id number]  Rondel Jumbo, PA-C ED   09/23/2015 1522 09/30/2015 1324 Full Code [id number]  Cathlyn Parsons, PA-C Inpatient    09/23/2015 1522 09/23/2015 1522 Full Code [id number]  Cathlyn Parsons, PA-C Inpatient   09/20/2015 1835 09/23/2015 1522 Full Code [id number]  Luanne Bras, MD Inpatient   09/15/2015 0009 09/17/2015 1519 Full Code [id number]  Reubin Milan, MD ED   Advance Care Planning Activity      TOTAL TIME TAKING CARE OF THIS PATIENT ON DAY OF DISCHARGE: more than 30 minutes.   Leia Alf Kennie Karapetian M.D on 01/24/2019 at 11:09 AM  Between 7am to 6pm - Pager - [phone]  After 6pm go to [email or web address] - password [email or web address] [facility]  Office  [phone]  CC: Primary care physician; Garwin Brothers, MD  Note: This dictation was prepared with Dragon dictation along with smaller phrase technology. Any transcriptional errors that result from this process are unintentional.

## 2019-01-24 NOTE — TOC Transition Note (Signed)
Transition of Care [facility]) - CM/SW Discharge Note   Patient Details  Name: [name] MRN: [id number] Date of Birth: March 13, 1956  Transition of Care [facility]) CM/SW Contact:  Beverly Sessions, RN Phone Number: 01/24/2019, 3:06 PM   Clinical Narrative:    Patient to discharge to [facility] today Clinical faxed to [facility] [place] living  Daughter notified EMS packet on chart   Final next level of care: Skilled Nursing Facility Barriers to Discharge: Barriers Resolved   Patient Goals and CMS Choice        Discharge Placement              Patient chooses bed at: [facility] Patient to be transferred to facility by: EMS Name of family member notified: [name] Patient and family notified of of transfer: 01/24/19  Discharge Plan and Services   Discharge Planning Services: CM Consult                                 Social Determinants of Health (SDOH) Interventions     Readmission Risk Interventions Readmission Risk Prevention Plan 11/08/2018  Post Dischage Appt Complete  Medication Screening Complete  Transportation Screening Complete  Some recent data might be hidden

## 2019-01-24 NOTE — Progress Notes (Signed)
01/24/2019 1:57 PM  Basil R Constancio to be D/C'd Home per MD order.  Discussed prescriptions and follow up appointments with the patient. Prescriptions given to patient, medication list explained in detail. Pt verbalized understanding.  Allergies as of 01/24/2019      Reactions   Metformin Diarrhea      Medication List    STOP taking these medications   modafinil 200 MG tablet Commonly known as: PROVIGIL     TAKE these medications   acetaminophen 325 MG tablet Commonly known as: TYLENOL Take 650 mg by mouth every 6 (six) hours as needed for mild pain or fever. Notes to patient: As needed   albuterol (2.5 MG/3ML) 0.083% nebulizer solution Commonly known as: PROVENTIL Take 2.5 mg by nebulization every 4 (four) hours as needed for wheezing or shortness of breath. Notes to patient: As needed   atorvastatin 40 MG tablet Commonly known as: LIPITOR Place 40 mg into feeding tube every evening. Notes to patient: Evening 01/24/19   baclofen 10 MG tablet Commonly known as: LIORESAL Take 1 tablet (10 mg total) by mouth 3 (three) times daily. Notes to patient: Afternoon 01/24/19   carvedilol 25 MG tablet Commonly known as: COREG Take 25 mg by mouth 2 (two) times daily with a meal. Notes to patient: With evening meal 8/28   cephALEXin 250 MG/5ML suspension Commonly known as: KEFLEX Take 10 mLs (500 mg total) by mouth 3 (three) times daily. Notes to patient: Before bed 01/24/19   collagenase ointment Commonly known as: SANTYL Apply 1 application topically as directed. (apply to lower left leg with every day shift change) Notes to patient: With dressing change 01/25/19   doxazosin 2 MG tablet Commonly known as: CARDURA Take 2 mg by mouth at bedtime. Notes to patient: Before bed 01/24/19   famotidine 20 MG tablet Commonly known as: PEPCID Take 20 mg by mouth 2 (two) times daily. Notes to patient: Before bed 01/24/19   feeding supplement (OSMOLITE 1.5 CAL) Liqd Place 1,000 mLs into  feeding tube continuous. Notes to patient: continuous   nutrition supplement (JUVEN) Pack Place 1 packet into feeding tube 2 (two) times daily between meals. Notes to patient: Morning 01/25/19   feeding supplement (PRO-STAT SUGAR FREE 64) Liqd Place 30 mLs into feeding tube 2 (two) times daily. Notes to patient: Night 01/24/19   finasteride 5 MG tablet Commonly known as: PROSCAR Take 1 tablet (5 mg total) by mouth at bedtime. Notes to patient: Before bed 01/24/19   free water Soln Place 150 mLs into feeding tube every 4 (four) hours. Notes to patient: Every 4 hours   levETIRAcetam 750 MG tablet Commonly known as: KEPPRA Take 750 mg by mouth 2 (two) times daily. Notes to patient: Before bed 01/24/19   methylphenidate 5 MG tablet Commonly known as: RITALIN Take 5 mg by mouth 2 (two) times daily. Notes to patient: Evening 01/24/19   mouth rinse Liqd solution 15 mLs by Mouth Rinse route 2 times daily at 12 noon and 4 pm. Notes to patient: 1600 01/24/19   polyethylene glycol 17 g packet Commonly known as: MIRALAX / GLYCOLAX Take 17 g by mouth daily as needed for mild constipation.   valACYclovir 1000 MG tablet Commonly known as: VALTREX Place 1 tablet (1,000 mg total) into feeding tube 3 (three) times daily. Notes to patient: Evening 01/24/19       Vitals:   01/24/19 0634 01/24/19 1344  BP: 132/74 (!) 145/87  Pulse: 93 90  Resp: 20 20  Temp: 99.2 F (37.3 C) 98.4 F (36.9 C)  SpO2: 95% 93%    Skin clean, dry and intact without evidence of skin break down, no evidence of skin tears noted. IV catheter discontinued intact. Site without signs and symptoms of complications. Dressing and pressure applied. Pt denies pain at this time. No complaints noted.  An After Visit Summary was printed and given to the patient. Patient escorted via [place], and D/C home via private auto.  Delbert Harness E\

## 2019-01-24 NOTE — ED Triage Notes (Signed)
Pt arrives via EMS after being discharged from rm 228 today. Per EMS when pt arrived at his destination of [facility] in [place] the staff there refused to accept the patient therefore he had to be brought back to our ER. PT does not speak and EMS reports hx stroke. PT in NAD

## 2019-01-24 NOTE — Progress Notes (Signed)
01/24/2019 1:57 PM  Spoke with receiving nurse at [facility], where patient is discharging today.  Answered all questions and gave phone numebr if further info is needed.  Dola Argyle, RN

## 2019-01-24 NOTE — Care Management (Signed)
RNCM notified by Deatra Ina at [facility] that when patient arrived he was febrile.  States that tempature was 103, 102, and 101.2.  She states that they waited 30 minutes, removed his blanket and was still febrile.  Lattie Haw states per their requirements patient has to have a temp of 99 or lower prior to admission.  They have sent patient back to the ED  Per Deatra Ina and Lissa Hoard from [facility] their intentions are to still accept the patient, and that is also the wishes of the patient's daughter [name].   [facility] [place] [facility] [place] phone number is [phone]

## 2019-01-25 DIAGNOSIS — Z7401 Bed confinement status: Secondary | ICD-10-CM | POA: Diagnosis not present

## 2019-01-25 DIAGNOSIS — M2550 Pain in unspecified joint: Secondary | ICD-10-CM | POA: Diagnosis not present

## 2019-01-25 DIAGNOSIS — R5381 Other malaise: Secondary | ICD-10-CM | POA: Diagnosis not present

## 2019-01-25 LAB — URINALYSIS, ROUTINE W REFLEX MICROSCOPIC
Bilirubin Urine: NEGATIVE
Glucose, UA: NEGATIVE mg/dL
Hgb urine dipstick: NEGATIVE
Ketones, ur: NEGATIVE mg/dL
Nitrite: NEGATIVE
Protein, ur: NEGATIVE mg/dL
Specific Gravity, Urine: 1.016 (ref 1.005–1.030)
Squamous Epithelial / HPF: NONE SEEN (ref 0–5)
pH: 8 (ref 5.0–8.0)

## 2019-01-25 LAB — LACTIC ACID, PLASMA: Lactic Acid, Venous: 0.9 mmol/L (ref 0.5–1.9)

## 2019-01-25 MED ORDER — OSMOLITE 1.5 CAL PO LIQD
1000.0000 mL | ORAL | Status: DC
  Administered 2019-01-25: 11:00:00 1000 mL

## 2019-01-25 MED ORDER — BACITRACIN ZINC 500 UNIT/GM EX OINT: TOPICAL_OINTMENT | Freq: Once | CUTANEOUS | Status: DC

## 2019-01-25 MED ORDER — METHYLPHENIDATE HCL 5 MG PO TABS: 5.0000 mg | ORAL_TABLET | Freq: Two times a day (BID) | ORAL | Status: DC

## 2019-01-25 NOTE — ED Notes (Signed)
Emptied 228ml out of foley catheter.

## 2019-01-25 NOTE — Discharge Instructions (Addendum)
Thank you for letting us take care of you in the emergency department today.   Please continue to take your regular, prescribed medications as noted.   Please follow up with: - Your primary care doctor to review your ER visit and follow up on your symptoms.   Please return to the ER for any new or worsening symptoms.

## 2019-01-25 NOTE — ED Notes (Signed)
Pt transported back to [facility] by [facility]. This RN called and spoke with Sharyn Lull, RN to inform her that pt was being transported at this time. Pt NAD at this time.

## 2019-01-25 NOTE — ED Notes (Signed)
This RN signed transfer consent as witness. Pt unable to sign due to stroke deficits. Pt's daughter Henrietta Dine) aware if patient being transferred back to rehab facility.

## 2019-01-25 NOTE — ED Notes (Signed)
Patient awake, nonverbal. Appears to focus. Both hands contracted. Moderate R arm movement. resp unlabored.

## 2019-01-25 NOTE — ED Notes (Signed)
Have sent records and given report to [place] at [facility].

## 2019-01-25 NOTE — ED Notes (Signed)
Sharyn Lull states ok to tx, Network engineer notified to order transport. Will reprint records with current meds charted.

## 2019-01-25 NOTE — ED Provider Notes (Signed)
-----------------------------------------   6:15 AM on 01/25/2019 -----------------------------------------   Blood pressure (!) 148/90, pulse 96, temperature 98.3 F (36.8 C), temperature source Axillary, resp. rate 18, height 6' (1.829 m), weight 104.8 kg, SpO2 94 %.  No events overnight.  Patient's urinalysis looked improved from prior and his lactic acid was negative.  Afebrile without antipyretics.  Remains in the ED pending social work assistance this morning; hope to place patient to his facility.   Paulette Blanch, MD 01/25/19 [phone]

## 2019-01-25 NOTE — Social Work (Addendum)
TOC CM/SW reviewed chart.   Received all from [facility]  Call place to [place], [place] [facility] [place] phone number is [phone] [facility] noted that the patient is able to return to facility today 01/25/2019.  Prior to return [facility] needs temperatures #s faxed to:  Marko Stai [phone].    SW collaborated with Cristine Polio - phone collaboration to plan safe discharge to [facility]. [place], MSW, LCSW  8am-6pm (weekends) [phone]

## 2019-01-25 NOTE — ED Notes (Signed)
Pt assisted to hospital bed with help of Myra, NT, Alissa, NT, and [place], Therapist, sports. Pt turned to right side with pillows placed underneath him. Pt tolerated well with no issue.

## 2019-01-26 LAB — CULTURE, BLOOD (ROUTINE X 2)
Culture: NO GROWTH
Culture: NO GROWTH
Special Requests: ADEQUATE
Special Requests: ADEQUATE

## 2019-01-27 DIAGNOSIS — L089 Local infection of the skin and subcutaneous tissue, unspecified: Secondary | ICD-10-CM | POA: Diagnosis not present

## 2019-01-27 DIAGNOSIS — N390 Urinary tract infection, site not specified: Secondary | ICD-10-CM | POA: Diagnosis not present

## 2019-01-27 DIAGNOSIS — A419 Sepsis, unspecified organism: Secondary | ICD-10-CM | POA: Diagnosis not present

## 2019-01-27 DIAGNOSIS — I4891 Unspecified atrial fibrillation: Secondary | ICD-10-CM | POA: Diagnosis not present

## 2019-06-30 DEATH — deceased

## 2020-07-09 ENCOUNTER — Encounter: Payer: Self-pay | Admitting: Gastroenterology

## 2021-01-15 IMAGING — CT CT HEAD WITHOUT CONTRAST
3 of 5 series · 15 of 47 positions shown, 18 images · non-contrast
Comparison: CT head 04/13/2017

CLINICAL DATA: Altered level of consciousness.

EXAM:
CT HEAD WITHOUT CONTRAST
TECHNIQUE: Contiguous axial images were obtained from the base of the skull
through the vertex without intravenous contrast.

[Series 2: head wo · axial · 0.46mm/px · z∈[-58,+62]mm · 9 of 29 slices shown, 12 images]
[im 3/29  brain]
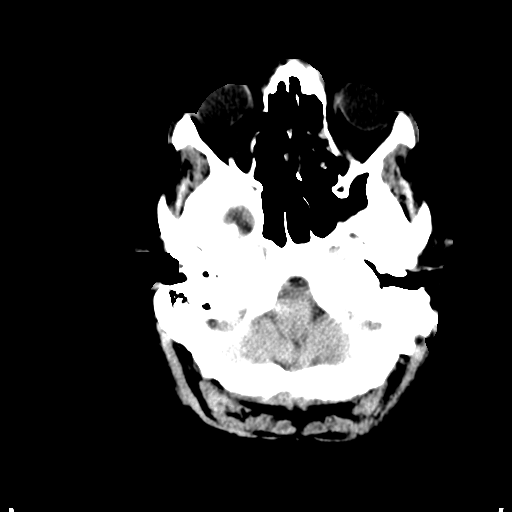
[im 3/29  bone]
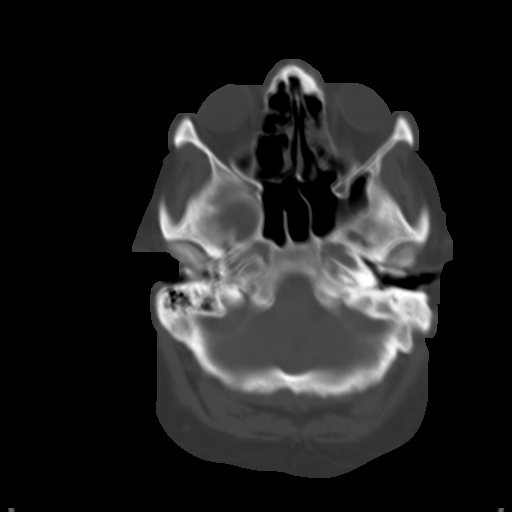
[im 7/29  brain]
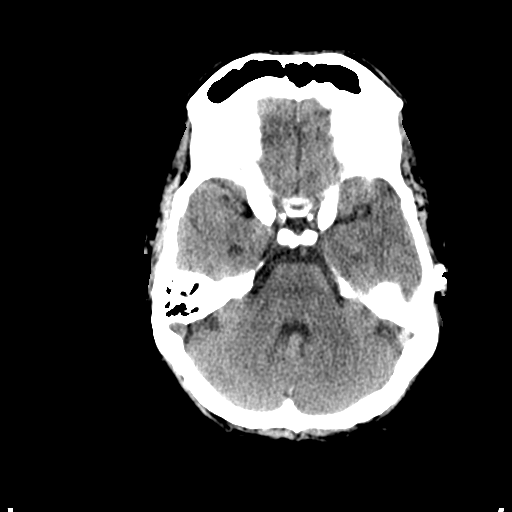
[im 9/29  brain]
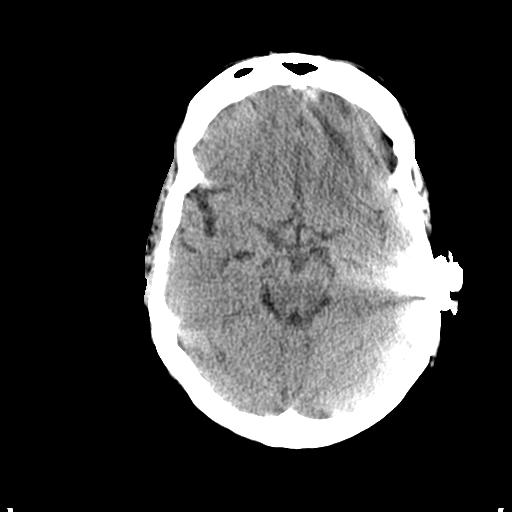
[im 13/29  brain]
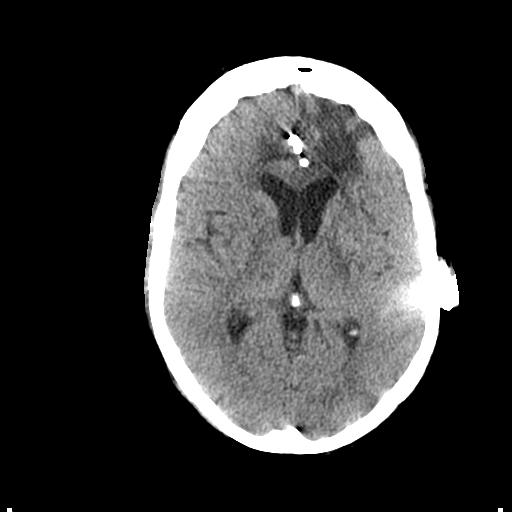
[im 15/29  brain]
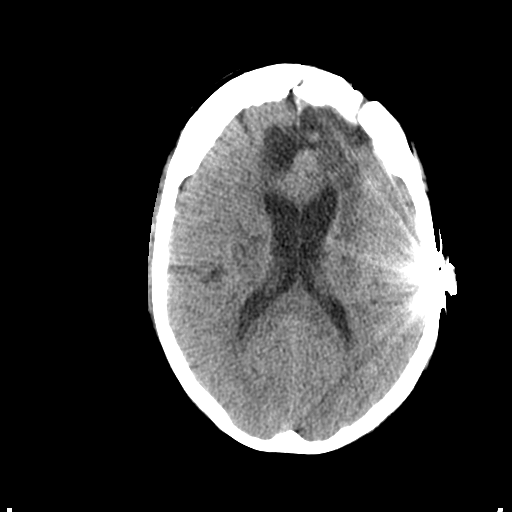
[im 15/29  bone]
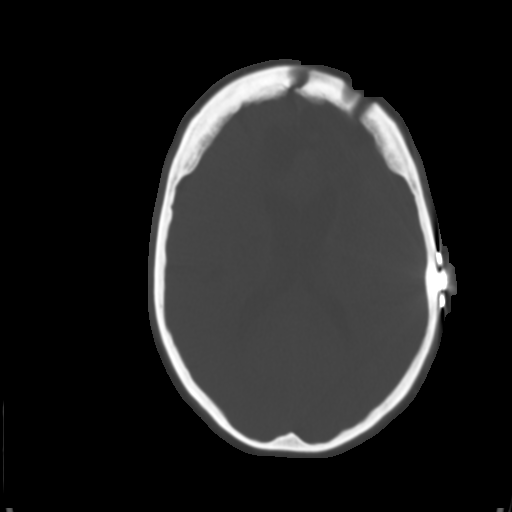
[im 17/29  brain]
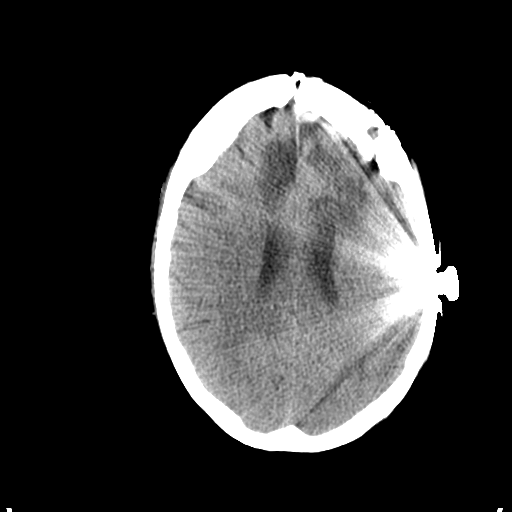
[im 21/29  brain]
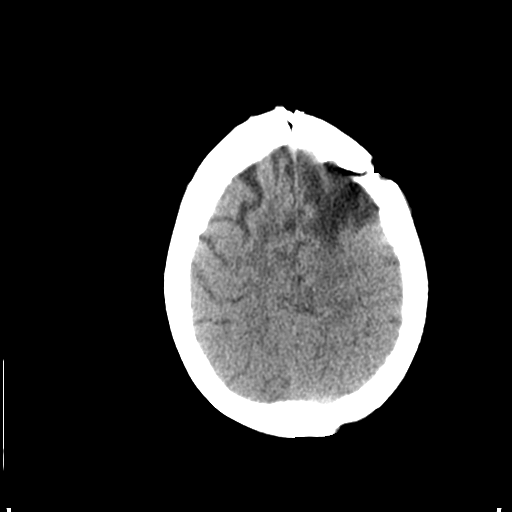
[im 23/29  brain]
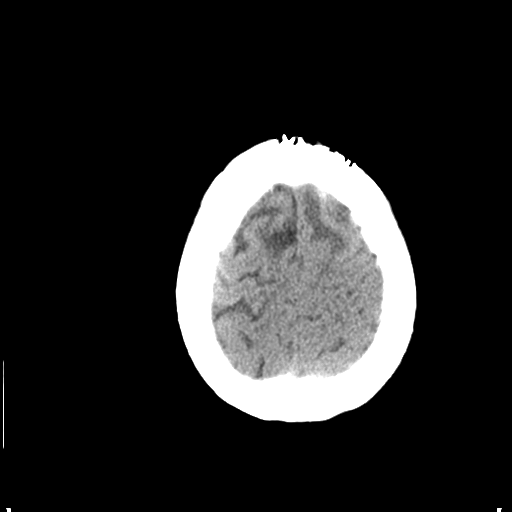
[im 27/29  brain]
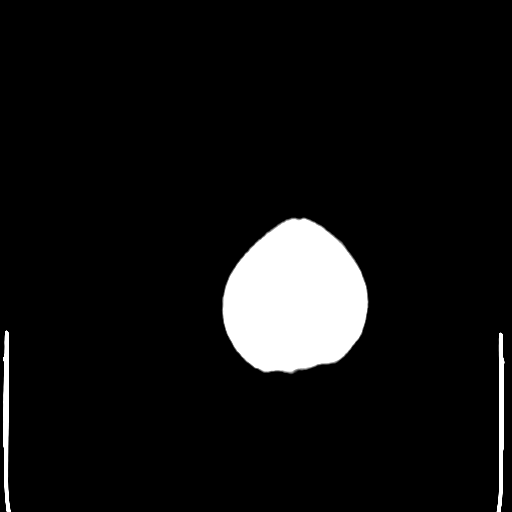
[im 27/29  bone]
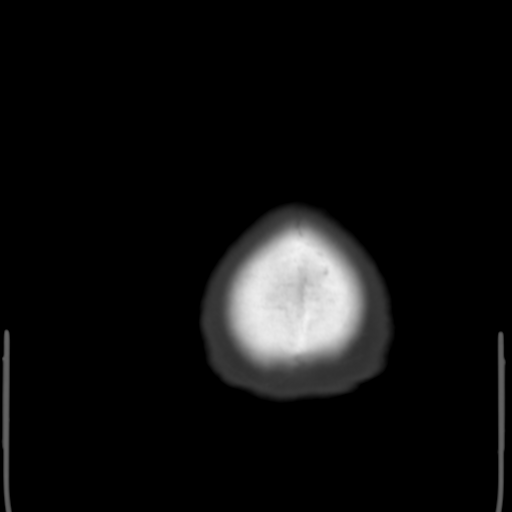

[Series 4: coronal soft tissue · coronal · 0.28mm/px · 3 of 67 slices shown]
[im 17/67  brain]
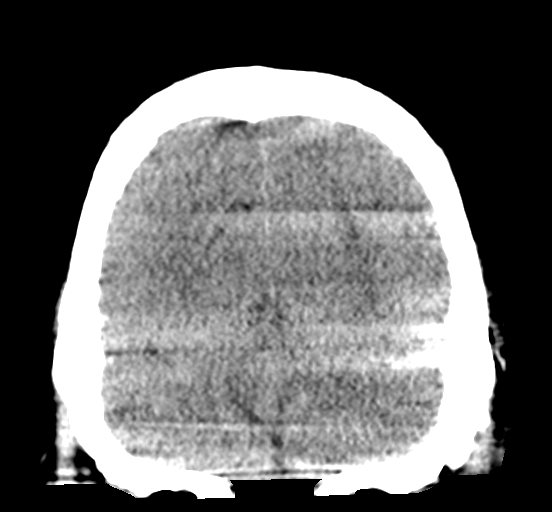
[im 34/67  brain]
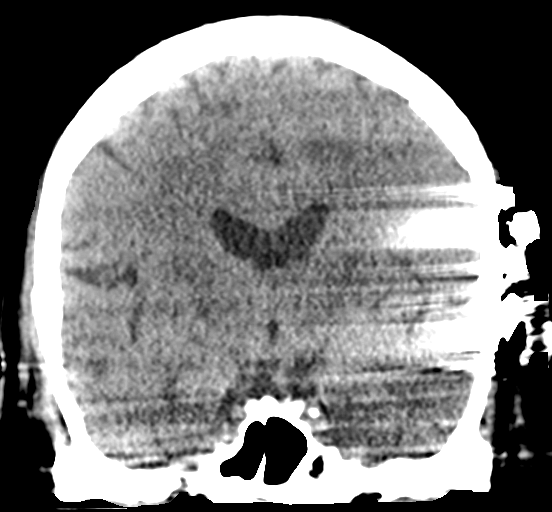
[im 50/67  brain]
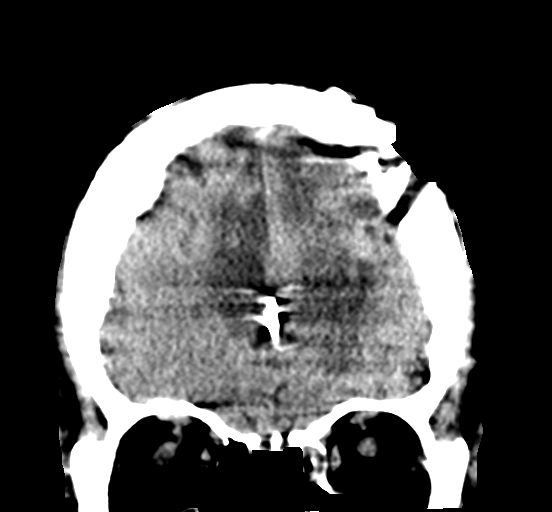

[Series 6: sagittal soft tissue · sagittal · 0.28mm/px · 3 of 52 slices shown]
[im 18/52  brain]
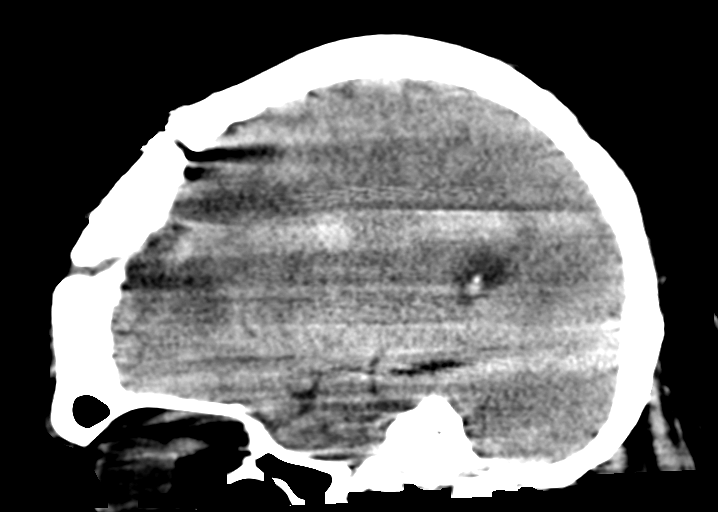
[im 26/52  brain]
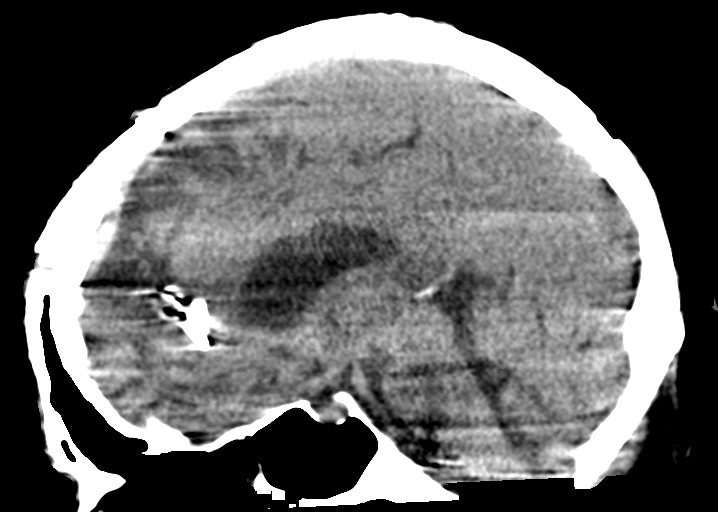
[im 35/52  brain]
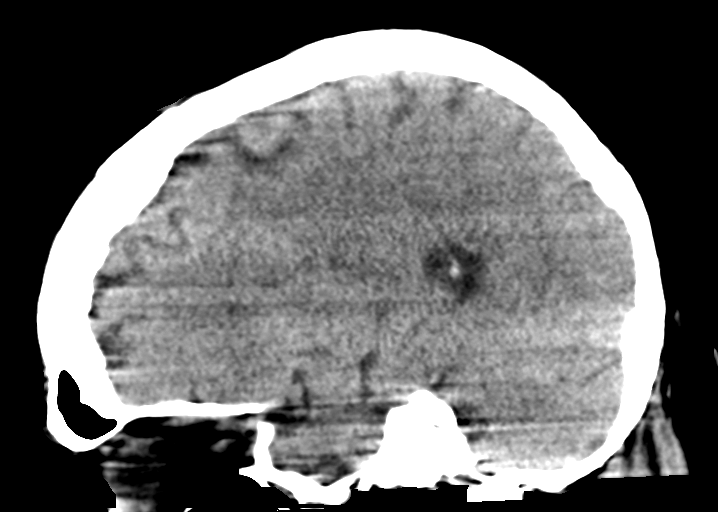

[15 of 47 positions shown; findings below may reference images not displayed]

FINDINGS: Brain: Image quality degraded by motion and streak artifact from
metal devices.

Bifrontal encephalomalacia is new since the prior CT. Interval left
frontal craniotomy. New aneurysm clip in the region of the left A2
segment. Previously identified pipeline stent in the left A2
segment.

Negative for acute hemorrhage, acute infarct, or mass lesion.

Vascular: Negative for hyperdense vessel.

Pipeline stent and surgical clipping of anterior cerebral artery
aneurysm.

Skull: Interval left frontal craniotomy.

Sinuses/Orbits: Cochlear implant on the left has been placed in the
interval. Chronic left mastoidectomy. Mucosal edema in the
mastoidectomy cavity.

Mucosal edema paranasal sinuses.  Negative orbit

Other: None
IMPRESSION: Left frontal craniotomy. Encephalomalacia in the anterior frontal
lobes bilaterally. Stenting and clipping of anterior cerebral artery
aneurysm.

No acute hemorrhage or infarction.

## 2021-01-27 IMAGING — DX ABDOMEN - 1 VIEW
1 series · 1 of 1 positions shown · non-contrast
Comparison: 10/31/2018.

CLINICAL DATA: NG tube placement.

EXAM:
ABDOMEN - 1 VIEW

[abdomen supine]
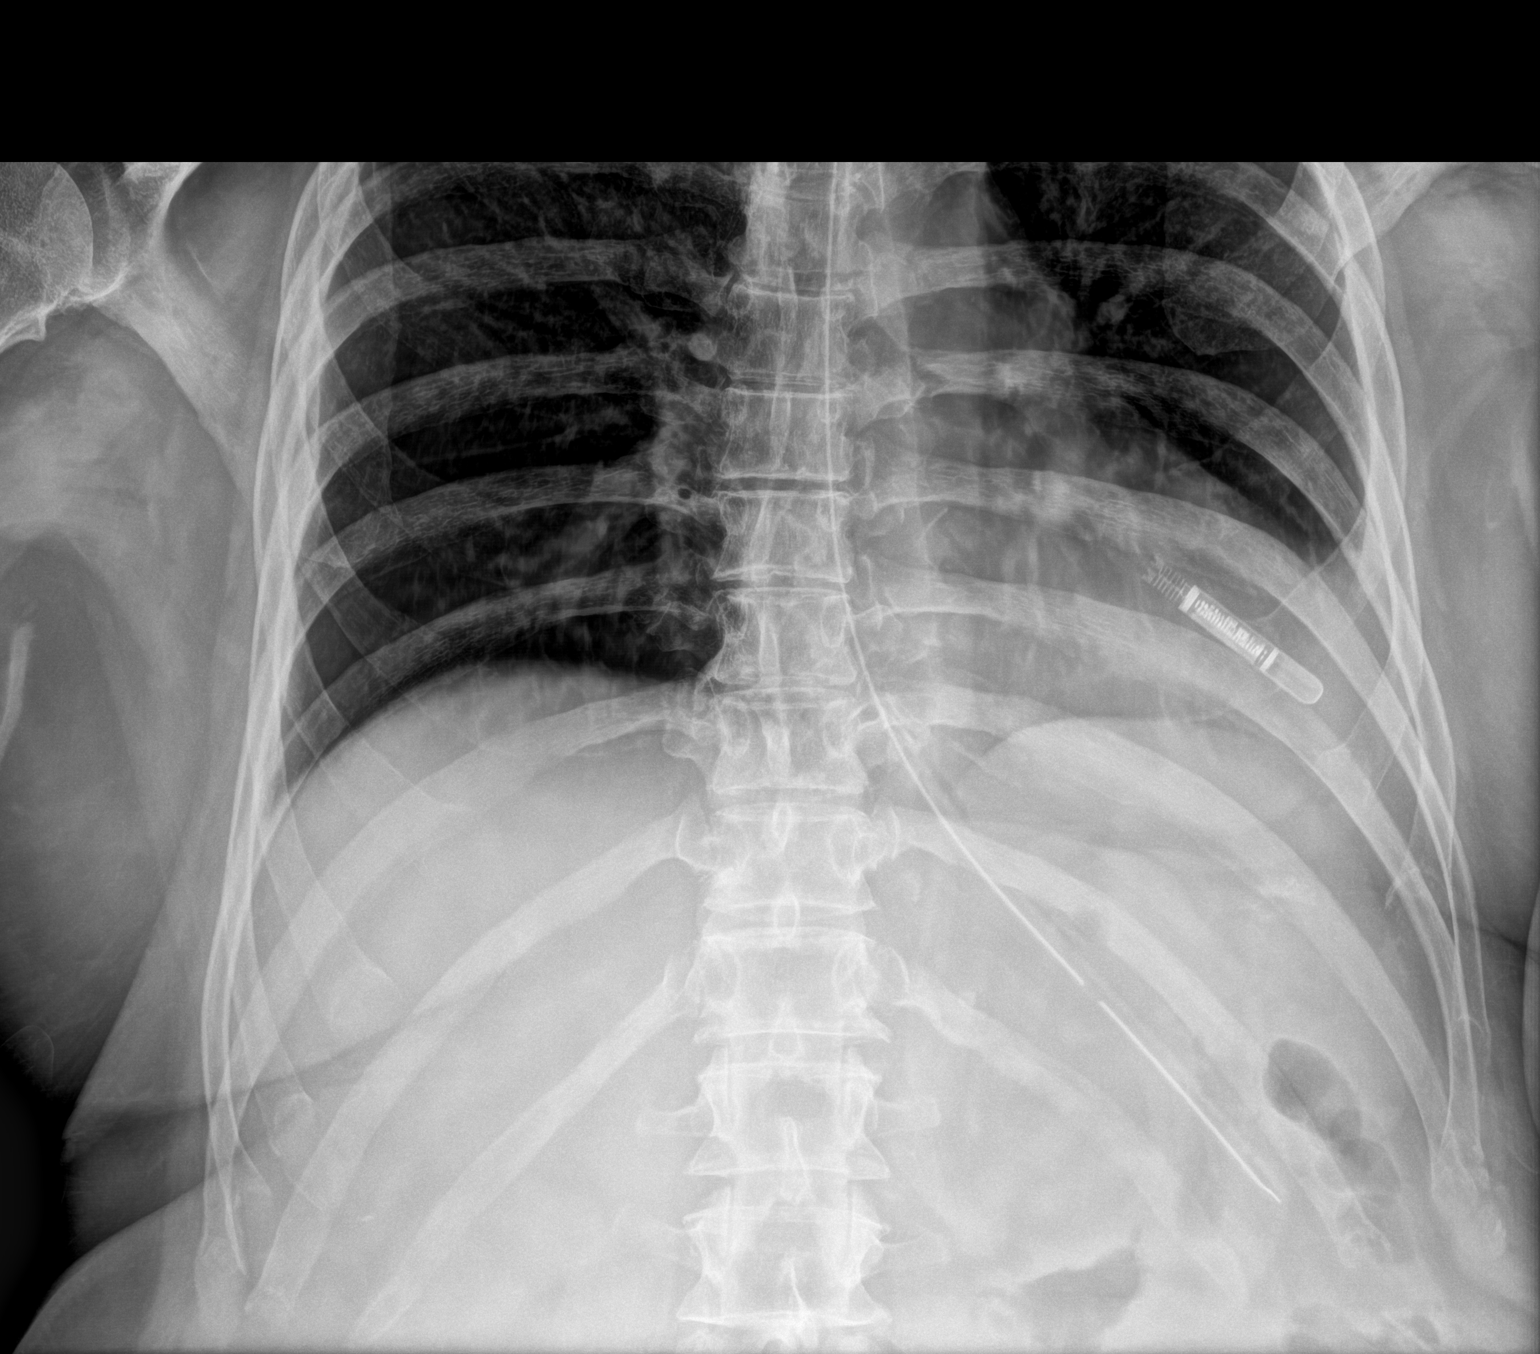

[1 of 1 positions shown; findings below may reference images not displayed]

FINDINGS: NG tube noted with tip over the stomach. Cardiac monitor device
noted over the chest. Left base atelectatic changes. Small left
pleural effusion cannot be excluded.
IMPRESSION: 1.  NG tube noted with tip over the stomach.

2. Left base atelectatic changes. Small left pleural effusion cannot
be excluded.

## 2021-01-27 IMAGING — DX PORTABLE CHEST - 1 VIEW
1 series · 1 of 1 positions shown · non-contrast
Comparison: 10/25/2018

CLINICAL DATA: Dyspnea

EXAM:
PORTABLE CHEST 1 VIEW

[chest ap]
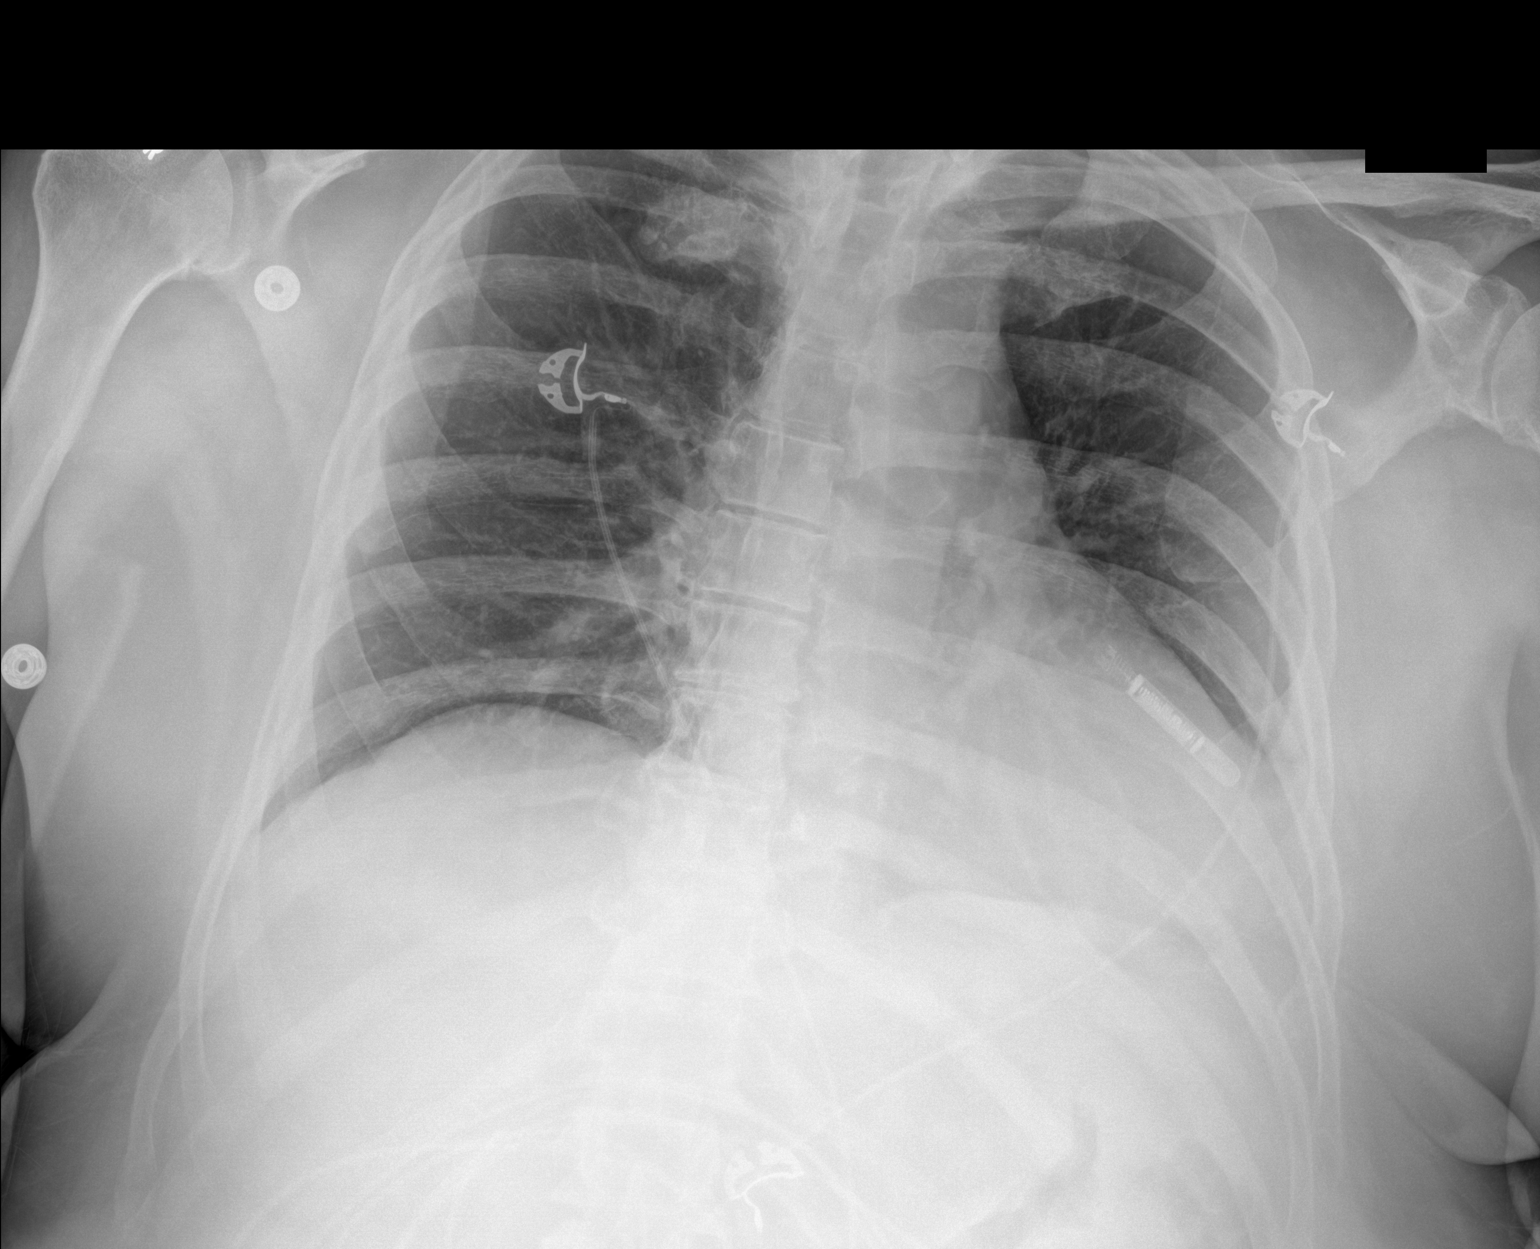

[1 of 1 positions shown; findings below may reference images not displayed]

FINDINGS: The trachea esophagus have been extubated. Mild cardiomegaly,
stable. Mildly low lung volumes. There is no edema, consolidation,
effusion, or pneumothorax. Haziness at the peripheral left chest is
likely from soft tissue attenuation. There is an implantable loop
recorder.
IMPRESSION: No evidence of acute disease.
